# Patient Record
Sex: Male | Born: 1945 | Race: White | Hispanic: No | Marital: Single
Health system: Southern US, Community
[De-identification: ages and names within clinical notes are randomized; demographics above are authoritative.]

## PROBLEM LIST (undated history)

## (undated) DIAGNOSIS — I2119 ST elevation (STEMI) myocardial infarction involving other coronary artery of inferior wall: Secondary | ICD-10-CM

## (undated) DIAGNOSIS — I10 Essential (primary) hypertension: Secondary | ICD-10-CM

## (undated) DIAGNOSIS — I255 Ischemic cardiomyopathy: Secondary | ICD-10-CM

## (undated) DIAGNOSIS — N183 Chronic kidney disease, stage 3 unspecified: Secondary | ICD-10-CM

## (undated) DIAGNOSIS — I472 Ventricular tachycardia, unspecified: Secondary | ICD-10-CM

## (undated) DIAGNOSIS — I5022 Chronic systolic (congestive) heart failure: Secondary | ICD-10-CM

## (undated) DIAGNOSIS — E785 Hyperlipidemia, unspecified: Secondary | ICD-10-CM

## (undated) DIAGNOSIS — I4901 Ventricular fibrillation: Secondary | ICD-10-CM

## (undated) DIAGNOSIS — I25119 Atherosclerotic heart disease of native coronary artery with unspecified angina pectoris: Secondary | ICD-10-CM

## (undated) DIAGNOSIS — Z95810 Presence of automatic (implantable) cardiac defibrillator: Secondary | ICD-10-CM

## (undated) DIAGNOSIS — F419 Anxiety disorder, unspecified: Secondary | ICD-10-CM

## (undated) DIAGNOSIS — I5042 Chronic combined systolic (congestive) and diastolic (congestive) heart failure: Secondary | ICD-10-CM

## (undated) DIAGNOSIS — I2510 Atherosclerotic heart disease of native coronary artery without angina pectoris: Secondary | ICD-10-CM

## (undated) DIAGNOSIS — I119 Hypertensive heart disease without heart failure: Secondary | ICD-10-CM

## (undated) HISTORY — DX: Morbid (severe) obesity due to excess calories: E66.01

## (undated) HISTORY — DX: Hyperlipidemia, unspecified: E78.5

## (undated) HISTORY — DX: Chronic combined systolic (congestive) and diastolic (congestive) heart failure: I50.42

## (undated) HISTORY — DX: Ventricular fibrillation: I49.01

## (undated) HISTORY — DX: Essential (primary) hypertension: I10

## (undated) HISTORY — PX: SKIN SURGERY: SHX2413

## (undated) HISTORY — DX: Anxiety disorder, unspecified: F41.9

## (undated) HISTORY — DX: Ischemic cardiomyopathy: I25.5

## (undated) HISTORY — DX: Atherosclerotic heart disease of native coronary artery with unspecified angina pectoris: I25.119

## (undated) HISTORY — PX: HEMORRHOID SURGERY: SHX153

## (undated) HISTORY — DX: Chronic kidney disease, stage 3 (moderate): N18.3

## (undated) HISTORY — DX: Hypertensive heart disease without heart failure: I11.9

## (undated) HISTORY — PX: TRANSTHORACIC ECHOCARDIOGRAM: SHX275

## (undated) HISTORY — DX: ST elevation (STEMI) myocardial infarction involving other coronary artery of inferior wall: I21.19

## (undated) SURGERY — Surgical Case
Anesthesia: *Unknown

---

## 2011-03-17 DIAGNOSIS — L819 Disorder of pigmentation, unspecified: Secondary | ICD-10-CM | POA: Diagnosis not present

## 2011-03-17 DIAGNOSIS — L821 Other seborrheic keratosis: Secondary | ICD-10-CM | POA: Diagnosis not present

## 2011-03-17 DIAGNOSIS — L578 Other skin changes due to chronic exposure to nonionizing radiation: Secondary | ICD-10-CM | POA: Diagnosis not present

## 2011-05-22 DIAGNOSIS — L980 Pyogenic granuloma: Secondary | ICD-10-CM | POA: Diagnosis not present

## 2011-05-22 DIAGNOSIS — L080 Pyoderma: Secondary | ICD-10-CM | POA: Diagnosis not present

## 2011-09-04 DIAGNOSIS — L270 Generalized skin eruption due to drugs and medicaments taken internally: Secondary | ICD-10-CM | POA: Diagnosis not present

## 2011-09-29 DIAGNOSIS — L819 Disorder of pigmentation, unspecified: Secondary | ICD-10-CM | POA: Diagnosis not present

## 2011-09-29 DIAGNOSIS — L578 Other skin changes due to chronic exposure to nonionizing radiation: Secondary | ICD-10-CM | POA: Diagnosis not present

## 2011-09-29 DIAGNOSIS — L820 Inflamed seborrheic keratosis: Secondary | ICD-10-CM | POA: Diagnosis not present

## 2011-09-29 DIAGNOSIS — L219 Seborrheic dermatitis, unspecified: Secondary | ICD-10-CM | POA: Diagnosis not present

## 2011-09-29 DIAGNOSIS — L570 Actinic keratosis: Secondary | ICD-10-CM | POA: Diagnosis not present

## 2011-09-29 DIAGNOSIS — Z85828 Personal history of other malignant neoplasm of skin: Secondary | ICD-10-CM | POA: Diagnosis not present

## 2011-09-29 DIAGNOSIS — L821 Other seborrheic keratosis: Secondary | ICD-10-CM | POA: Diagnosis not present

## 2011-10-16 DIAGNOSIS — H2510 Age-related nuclear cataract, unspecified eye: Secondary | ICD-10-CM | POA: Diagnosis not present

## 2011-10-16 DIAGNOSIS — H5210 Myopia, unspecified eye: Secondary | ICD-10-CM | POA: Diagnosis not present

## 2011-12-03 DIAGNOSIS — Z23 Encounter for immunization: Secondary | ICD-10-CM | POA: Diagnosis not present

## 2011-12-29 DIAGNOSIS — L578 Other skin changes due to chronic exposure to nonionizing radiation: Secondary | ICD-10-CM | POA: Diagnosis not present

## 2011-12-29 DIAGNOSIS — Z85828 Personal history of other malignant neoplasm of skin: Secondary | ICD-10-CM | POA: Diagnosis not present

## 2011-12-29 DIAGNOSIS — L719 Rosacea, unspecified: Secondary | ICD-10-CM | POA: Diagnosis not present

## 2011-12-29 DIAGNOSIS — L570 Actinic keratosis: Secondary | ICD-10-CM | POA: Diagnosis not present

## 2012-05-17 DIAGNOSIS — L578 Other skin changes due to chronic exposure to nonionizing radiation: Secondary | ICD-10-CM | POA: Diagnosis not present

## 2012-05-17 DIAGNOSIS — Z85828 Personal history of other malignant neoplasm of skin: Secondary | ICD-10-CM | POA: Diagnosis not present

## 2012-05-17 DIAGNOSIS — L819 Disorder of pigmentation, unspecified: Secondary | ICD-10-CM | POA: Diagnosis not present

## 2012-05-17 DIAGNOSIS — L565 Disseminated superficial actinic porokeratosis (DSAP): Secondary | ICD-10-CM | POA: Diagnosis not present

## 2012-05-17 DIAGNOSIS — L821 Other seborrheic keratosis: Secondary | ICD-10-CM | POA: Diagnosis not present

## 2012-05-17 DIAGNOSIS — D485 Neoplasm of uncertain behavior of skin: Secondary | ICD-10-CM | POA: Diagnosis not present

## 2012-06-08 DIAGNOSIS — L565 Disseminated superficial actinic porokeratosis (DSAP): Secondary | ICD-10-CM | POA: Diagnosis not present

## 2012-06-08 DIAGNOSIS — D485 Neoplasm of uncertain behavior of skin: Secondary | ICD-10-CM | POA: Diagnosis not present

## 2012-06-29 DIAGNOSIS — E785 Hyperlipidemia, unspecified: Secondary | ICD-10-CM | POA: Diagnosis not present

## 2012-06-29 DIAGNOSIS — Z125 Encounter for screening for malignant neoplasm of prostate: Secondary | ICD-10-CM | POA: Diagnosis not present

## 2012-06-29 DIAGNOSIS — R7301 Impaired fasting glucose: Secondary | ICD-10-CM | POA: Diagnosis not present

## 2012-06-29 DIAGNOSIS — I10 Essential (primary) hypertension: Secondary | ICD-10-CM | POA: Diagnosis not present

## 2012-07-21 DIAGNOSIS — Z6839 Body mass index (BMI) 39.0-39.9, adult: Secondary | ICD-10-CM | POA: Diagnosis not present

## 2012-07-21 DIAGNOSIS — Z0000 Encounter for general adult medical examination without abnormal findings: Secondary | ICD-10-CM | POA: Diagnosis not present

## 2012-07-21 DIAGNOSIS — R7301 Impaired fasting glucose: Secondary | ICD-10-CM | POA: Diagnosis not present

## 2012-07-21 DIAGNOSIS — R972 Elevated prostate specific antigen [PSA]: Secondary | ICD-10-CM | POA: Diagnosis not present

## 2012-07-21 DIAGNOSIS — E785 Hyperlipidemia, unspecified: Secondary | ICD-10-CM | POA: Diagnosis not present

## 2012-07-21 DIAGNOSIS — I10 Essential (primary) hypertension: Secondary | ICD-10-CM | POA: Diagnosis not present

## 2012-07-21 DIAGNOSIS — Z1331 Encounter for screening for depression: Secondary | ICD-10-CM | POA: Diagnosis not present

## 2012-11-21 DIAGNOSIS — L219 Seborrheic dermatitis, unspecified: Secondary | ICD-10-CM | POA: Diagnosis not present

## 2012-11-21 DIAGNOSIS — L259 Unspecified contact dermatitis, unspecified cause: Secondary | ICD-10-CM | POA: Diagnosis not present

## 2012-11-21 DIAGNOSIS — Z85828 Personal history of other malignant neoplasm of skin: Secondary | ICD-10-CM | POA: Diagnosis not present

## 2012-11-21 DIAGNOSIS — L570 Actinic keratosis: Secondary | ICD-10-CM | POA: Diagnosis not present

## 2012-11-21 DIAGNOSIS — Q828 Other specified congenital malformations of skin: Secondary | ICD-10-CM | POA: Diagnosis not present

## 2012-11-24 DIAGNOSIS — Z23 Encounter for immunization: Secondary | ICD-10-CM | POA: Diagnosis not present

## 2012-12-09 DIAGNOSIS — F4323 Adjustment disorder with mixed anxiety and depressed mood: Secondary | ICD-10-CM | POA: Diagnosis not present

## 2013-01-06 DIAGNOSIS — F4323 Adjustment disorder with mixed anxiety and depressed mood: Secondary | ICD-10-CM | POA: Diagnosis not present

## 2013-03-24 DIAGNOSIS — H2510 Age-related nuclear cataract, unspecified eye: Secondary | ICD-10-CM | POA: Diagnosis not present

## 2013-03-24 DIAGNOSIS — H52209 Unspecified astigmatism, unspecified eye: Secondary | ICD-10-CM | POA: Diagnosis not present

## 2013-03-24 DIAGNOSIS — H5210 Myopia, unspecified eye: Secondary | ICD-10-CM | POA: Diagnosis not present

## 2013-04-28 DIAGNOSIS — F4323 Adjustment disorder with mixed anxiety and depressed mood: Secondary | ICD-10-CM | POA: Diagnosis not present

## 2013-08-02 DIAGNOSIS — IMO0002 Reserved for concepts with insufficient information to code with codable children: Secondary | ICD-10-CM | POA: Diagnosis not present

## 2013-10-06 DIAGNOSIS — L819 Disorder of pigmentation, unspecified: Secondary | ICD-10-CM | POA: Diagnosis not present

## 2013-10-06 DIAGNOSIS — D2320 Other benign neoplasm of skin of unspecified ear and external auricular canal: Secondary | ICD-10-CM | POA: Diagnosis not present

## 2013-10-06 DIAGNOSIS — L570 Actinic keratosis: Secondary | ICD-10-CM | POA: Diagnosis not present

## 2013-10-06 DIAGNOSIS — L565 Disseminated superficial actinic porokeratosis (DSAP): Secondary | ICD-10-CM | POA: Diagnosis not present

## 2013-10-06 DIAGNOSIS — Z85828 Personal history of other malignant neoplasm of skin: Secondary | ICD-10-CM | POA: Diagnosis not present

## 2013-10-06 DIAGNOSIS — D2330 Other benign neoplasm of skin of unspecified part of face: Secondary | ICD-10-CM | POA: Diagnosis not present

## 2013-11-10 DIAGNOSIS — Z23 Encounter for immunization: Secondary | ICD-10-CM | POA: Diagnosis not present

## 2014-01-22 DIAGNOSIS — M79642 Pain in left hand: Secondary | ICD-10-CM | POA: Diagnosis not present

## 2014-01-22 DIAGNOSIS — W19XXXA Unspecified fall, initial encounter: Secondary | ICD-10-CM | POA: Diagnosis not present

## 2014-01-22 DIAGNOSIS — R030 Elevated blood-pressure reading, without diagnosis of hypertension: Secondary | ICD-10-CM | POA: Diagnosis not present

## 2014-10-04 DIAGNOSIS — Z85828 Personal history of other malignant neoplasm of skin: Secondary | ICD-10-CM | POA: Diagnosis not present

## 2014-10-04 DIAGNOSIS — D225 Melanocytic nevi of trunk: Secondary | ICD-10-CM | POA: Diagnosis not present

## 2014-10-04 DIAGNOSIS — L821 Other seborrheic keratosis: Secondary | ICD-10-CM | POA: Diagnosis not present

## 2014-10-04 DIAGNOSIS — L565 Disseminated superficial actinic porokeratosis (DSAP): Secondary | ICD-10-CM | POA: Diagnosis not present

## 2014-10-04 DIAGNOSIS — L570 Actinic keratosis: Secondary | ICD-10-CM | POA: Diagnosis not present

## 2014-11-28 DIAGNOSIS — Z23 Encounter for immunization: Secondary | ICD-10-CM | POA: Diagnosis not present

## 2015-01-20 HISTORY — PX: CORONARY ANGIOPLASTY WITH STENT PLACEMENT: SHX49

## 2015-01-20 HISTORY — PX: ICD IMPLANT: EP1208

## 2015-02-12 ENCOUNTER — Encounter: Payer: Self-pay | Admitting: Family Medicine

## 2015-02-12 ENCOUNTER — Ambulatory Visit (INDEPENDENT_AMBULATORY_CARE_PROVIDER_SITE_OTHER): Payer: Medicare Other | Admitting: Family Medicine

## 2015-02-12 VITALS — BP 150/80 | HR 68 | Ht 73.0 in | Wt 313.4 lb

## 2015-02-12 DIAGNOSIS — R778 Other specified abnormalities of plasma proteins: Secondary | ICD-10-CM | POA: Insufficient documentation

## 2015-02-12 DIAGNOSIS — Z7189 Other specified counseling: Secondary | ICD-10-CM

## 2015-02-12 DIAGNOSIS — F32A Depression, unspecified: Secondary | ICD-10-CM

## 2015-02-12 DIAGNOSIS — F418 Other specified anxiety disorders: Secondary | ICD-10-CM | POA: Diagnosis not present

## 2015-02-12 DIAGNOSIS — Z23 Encounter for immunization: Secondary | ICD-10-CM | POA: Diagnosis not present

## 2015-02-12 DIAGNOSIS — R7989 Other specified abnormal findings of blood chemistry: Secondary | ICD-10-CM

## 2015-02-12 DIAGNOSIS — F419 Anxiety disorder, unspecified: Secondary | ICD-10-CM

## 2015-02-12 DIAGNOSIS — I10 Essential (primary) hypertension: Secondary | ICD-10-CM | POA: Diagnosis not present

## 2015-02-12 DIAGNOSIS — N400 Enlarged prostate without lower urinary tract symptoms: Secondary | ICD-10-CM | POA: Insufficient documentation

## 2015-02-12 DIAGNOSIS — N529 Male erectile dysfunction, unspecified: Secondary | ICD-10-CM | POA: Insufficient documentation

## 2015-02-12 DIAGNOSIS — F329 Major depressive disorder, single episode, unspecified: Secondary | ICD-10-CM

## 2015-02-12 DIAGNOSIS — E785 Hyperlipidemia, unspecified: Secondary | ICD-10-CM | POA: Insufficient documentation

## 2015-02-12 DIAGNOSIS — Z7689 Persons encountering health services in other specified circumstances: Secondary | ICD-10-CM

## 2015-02-12 MED ORDER — VENLAFAXINE HCL 75 MG PO TABS: 75.0000 mg | ORAL_TABLET | Freq: Every day | ORAL | Status: DC

## 2015-02-12 NOTE — Patient Instructions (Addendum)
Return for blood pressure check in 10-14 days. Also, schedule a complete physical exam and fasting blood work at your convenience in the next few weeks. You received your Tdap today. This shot is tetanus, diphtheria as well as whooping cough protection.  DASH Eating Plan DASH stands for "Dietary Approaches to Stop Hypertension." The DASH eating plan is a healthy eating plan that has been shown to reduce high blood pressure (hypertension). Additional health benefits may include reducing the risk of type 2 diabetes mellitus, heart disease, and stroke. The DASH eating plan may also help with weight loss. WHAT DO I NEED TO KNOW ABOUT THE DASH EATING PLAN? For the DASH eating plan, you will follow these general guidelines:  Choose foods with a percent daily value for sodium of less than 5% (as listed on the food label).  Use salt-free seasonings or herbs instead of table salt or sea salt.  Check with your health care provider or pharmacist before using salt substitutes.  Eat lower-sodium products, often labeled as "lower sodium" or "no salt added."  Eat fresh foods.  Eat more vegetables, fruits, and low-fat dairy products.  Choose whole grains. Look for the word "whole" as the first word in the ingredient list.  Choose fish and skinless chicken or [place] more often than red meat. Limit fish, poultry, and meat to 6 oz (170 g) each day.  Limit sweets, desserts, sugars, and sugary drinks.  Choose heart-healthy fats.  Limit cheese to 1 oz (28 g) per day.  Eat more home-cooked food and less restaurant, buffet, and fast food.  Limit fried foods.  Cook foods using methods other than frying.  Limit canned vegetables. If you do use them, rinse them well to decrease the sodium.  When eating at a restaurant, ask that your food be prepared with less salt, or no salt if possible. WHAT FOODS CAN I EAT? Seek help from a dietitian for individual calorie needs. Grains Whole grain or whole wheat  bread. Brown rice. Whole grain or whole wheat pasta. Quinoa, bulgur, and whole grain cereals. Low-sodium cereals. Corn or whole wheat flour tortillas. Whole grain cornbread. Whole grain crackers. Low-sodium crackers. Vegetables Fresh or frozen vegetables (raw, steamed, roasted, or grilled). Low-sodium or reduced-sodium tomato and vegetable juices. Low-sodium or reduced-sodium tomato sauce and paste. Low-sodium or reduced-sodium canned vegetables.  Fruits All fresh, canned (in natural juice), or frozen fruits. Meat and Other Protein Products Ground beef (85% or leaner), grass-fed beef, or beef trimmed of fat. Skinless chicken or [place]. Ground chicken or [place]. Pork trimmed of fat. All fish and seafood. Eggs. Dried beans, peas, or lentils. Unsalted nuts and seeds. Unsalted canned beans. Dairy Low-fat dairy products, such as skim or 1% milk, 2% or reduced-fat cheeses, low-fat ricotta or cottage cheese, or plain low-fat yogurt. Low-sodium or reduced-sodium cheeses. Fats and Oils Tub margarines without trans fats. Light or reduced-fat mayonnaise and salad dressings (reduced sodium). Avocado. Safflower, olive, or canola oils. Natural peanut or almond butter. Other Unsalted popcorn and pretzels. The items listed above may not be a complete list of recommended foods or beverages. Contact your dietitian for more options. WHAT FOODS ARE NOT RECOMMENDED? Grains White bread. White pasta. White rice. Refined cornbread. Bagels and croissants. Crackers that contain trans fat. Vegetables Creamed or fried vegetables. Vegetables in a cheese sauce. Regular canned vegetables. Regular canned tomato sauce and paste. Regular tomato and vegetable juices. Fruits Dried fruits. Canned fruit in light or heavy syrup. Fruit juice. Meat and Other Protein Products  Fatty cuts of meat. Ribs, chicken wings, bacon, sausage, bologna, salami, chitterlings, fatback, hot dogs, bratwurst, and packaged luncheon meats. Salted nuts and  seeds. Canned beans with salt. Dairy Whole or 2% milk, cream, half-and-half, and cream cheese. Whole-fat or sweetened yogurt. Full-fat cheeses or blue cheese. Nondairy creamers and whipped toppings. Processed cheese, cheese spreads, or cheese curds. Condiments Onion and garlic salt, seasoned salt, table salt, and sea salt. Canned and packaged gravies. Worcestershire sauce. Tartar sauce. Barbecue sauce. Teriyaki sauce. Soy sauce, including reduced sodium. Steak sauce. Fish sauce. Oyster sauce. Cocktail sauce. Horseradish. Ketchup and mustard. Meat flavorings and tenderizers. Bouillon cubes. Hot sauce. Tabasco sauce. Marinades. Taco seasonings. Relishes. Fats and Oils Butter, stick margarine, lard, shortening, ghee, and bacon fat. Coconut, palm kernel, or palm oils. Regular salad dressings. Other Pickles and olives. Salted popcorn and pretzels. The items listed above may not be a complete list of foods and beverages to avoid. Contact your dietitian for more information. WHERE CAN I FIND MORE INFORMATION? National Heart, Lung, and Blood Institute: travelstabloid.com   This information is not intended to replace advice given to you by your health care provider. Make sure you discuss any questions you have with your health care provider.   Document Released: 12/25/2010 Document Revised: 01/26/2014 Document Reviewed: 11/09/2012 Elsevier Interactive Patient Education [employer].

## 2015-02-12 NOTE — Progress Notes (Signed)
   Subjective:    Patient ID: Leonard [name], male    DOB: 1945-05-11, 70 y.o.   MRN: [id number]  HPI Chief Complaint  Patient presents with  . new pt    get established. needs refill on a few meds   He is new to the practice and here to establish primary care. Last physical exam and blood work in 27-Apr-2012.  He states he was dismissed by Dr Forde Dandy at [facility] due to missing an appointment.  States he just got married in 11/27/2013. His first wife passed away in 04-27-2012 and he cares for his daughter at home who is a quadriplegic. He states he has a great deal of stress at home and that he has a son who is in a court and has an addiction problem. He is currently still working as a Investment banker, operational a baseball team.  He states he needs refill on Effexor for anxiety. Requesting an increase in dose. States he does not think he is depressed but is anxious due to all the stress he is dealing with. He states he has seen a counselor in the past but not recently. He reports "self-medicating" and drinks 3-4 glasses of scotch approximate 4 nights per week. Does not smoke and denies drug use.  Denies fever, chills, unexpected weight change, fatigue, chest pain, DOE.  PMH: Hypertension, reports good medication compliance. He does not check his blood pressure at home. history of hyperlipidemia.   BPH with elevated PSA, sees [facility]. States he had a procedure due to elevated PSA and everything checked out okay. Reports childhood asthma but no issues since.    States he needs tetanus updated and would like to get this today. He cannot recall ever having Tdap.  States he had the flu shot this season.     Review of Systems Pertinent positives and negatives in the history of present illness.    Objective:   Physical Exam  Constitutional: He is oriented to person, place, and time. He appears well-developed and well-nourished.  Cardiovascular: Normal rate, regular rhythm and normal heart  sounds.   Pulmonary/Chest: Effort normal and breath sounds normal.  Neurological: He is alert and oriented to person, place, and time.  Skin: Skin is warm and dry. No pallor.  Psychiatric: He has a normal mood and affect. His behavior is normal. Judgment and thought content normal.   BP 150/80 mmHg  Pulse 68  Ht 6\' 1"  (1.854 m)  Wt 313 lb 6.4 oz (142.157 kg)  BMI 41.36 kg/m2  Repeat blood pressure 150/80.      Assessment & Plan:  Essential hypertension  Anxiety and depression - Plan: venlafaxine (EFFEXOR) 75 MG tablet  Need for Tdap vaccination - Plan: Tdap vaccine greater than or equal to 7yo IM  Encounter to establish care  Discussed that I will refill Effexor for his anxiety but that I am not comfortable increasing the dosage today. Discussed the possibility of referring him to counseling. He does not want to do this at this time. Will consider increasing dose of Effexor if no improvement.  Recommend drinking no more than 2 glasses of scotch per night for overall health reasons. Tdap given.  Recommend that he return in 10-14 days for a blood pressure check. Also, recommend that he schedule an appointment for fasting blood work and a complete physical exam at his convenience in the next few weeks. Will order PSA unless Urologist is watching this.

## 2015-02-25 ENCOUNTER — Other Ambulatory Visit: Payer: Medicare Other

## 2015-02-26 DIAGNOSIS — H5213 Myopia, bilateral: Secondary | ICD-10-CM | POA: Diagnosis not present

## 2015-02-26 DIAGNOSIS — H2513 Age-related nuclear cataract, bilateral: Secondary | ICD-10-CM | POA: Diagnosis not present

## 2015-03-01 ENCOUNTER — Other Ambulatory Visit: Payer: Medicare Other

## 2015-03-05 ENCOUNTER — Telehealth: Payer: Self-pay | Admitting: Family Medicine

## 2015-03-05 ENCOUNTER — Other Ambulatory Visit: Payer: Self-pay | Admitting: Family Medicine

## 2015-03-05 ENCOUNTER — Encounter: Payer: Self-pay | Admitting: Family Medicine

## 2015-03-05 MED ORDER — METOPROLOL TARTRATE 25 MG PO TABS: 25.0000 mg | ORAL_TABLET | Freq: Every day | ORAL | Status: DC

## 2015-03-05 NOTE — Telephone Encounter (Signed)
Rcvd refill request for Metoprolol ER Succinate 25 mg

## 2015-03-05 NOTE — Telephone Encounter (Signed)
done

## 2015-03-06 ENCOUNTER — Telehealth: Payer: Self-pay | Admitting: Internal Medicine

## 2015-03-06 MED ORDER — METOPROLOL SUCCINATE ER 25 MG PO TB24: 25.0000 mg | ORAL_TABLET | Freq: Every day | ORAL | Status: DC

## 2015-03-06 NOTE — Telephone Encounter (Signed)
Pharmacy called to let us know that pt has been on metoprolol 25mg  ER and not the regular 25mg . Pharmacy gave pt the regular 25mg  as that is what we sent to the pharmacy. Pt has only been seen here once, and so it could of been that the patient was telling us his meds off the top of his head and just missed the ER part of med. i have sent in new rx for metprolol 25mg  ER and called and told pt that he doesn't need to take the regular 25mg  but needed to go pick up new rx of ER to the pharmacy.

## 2015-03-22 ENCOUNTER — Encounter: Payer: Self-pay | Admitting: Medical

## 2015-03-22 ENCOUNTER — Ambulatory Visit (INDEPENDENT_AMBULATORY_CARE_PROVIDER_SITE_OTHER): Payer: Medicare Other | Admitting: Medical

## 2015-03-22 VITALS — BP 124/70 | HR 114 | Temp 100.6°F | Resp 18 | Wt 310.0 lb

## 2015-03-22 DIAGNOSIS — Z789 Other specified health status: Secondary | ICD-10-CM

## 2015-03-22 DIAGNOSIS — R509 Fever, unspecified: Secondary | ICD-10-CM | POA: Diagnosis not present

## 2015-03-22 DIAGNOSIS — E781 Pure hyperglyceridemia: Secondary | ICD-10-CM

## 2015-03-22 DIAGNOSIS — Z7289 Other problems related to lifestyle: Secondary | ICD-10-CM

## 2015-03-22 DIAGNOSIS — R112 Nausea with vomiting, unspecified: Secondary | ICD-10-CM | POA: Diagnosis not present

## 2015-03-22 DIAGNOSIS — R1084 Generalized abdominal pain: Secondary | ICD-10-CM | POA: Diagnosis not present

## 2015-03-22 DIAGNOSIS — R197 Diarrhea, unspecified: Secondary | ICD-10-CM

## 2015-03-22 LAB — BASIC METABOLIC PANEL
BUN: 26 mg/dL — ABNORMAL HIGH (ref 7–25)
CO2: 25 mmol/L (ref 20–31)
CREATININE: 1.33 mg/dL — AB (ref 0.70–1.18)
Calcium: 8.7 mg/dL (ref 8.6–10.3)
Chloride: 97 mmol/L — ABNORMAL LOW (ref 98–110)
Glucose, Bld: 138 mg/dL — ABNORMAL HIGH (ref 65–99)
Potassium: 4.7 mmol/L (ref 3.5–5.3)
Sodium: 134 mmol/L — ABNORMAL LOW (ref 135–146)

## 2015-03-22 LAB — CBC WITH DIFFERENTIAL/PLATELET
BASOS PCT: 0 % (ref 0–1)
Basophils Absolute: 0 10*3/uL (ref 0.0–0.1)
EOS ABS: 0 10*3/uL (ref 0.0–0.7)
Eosinophils Relative: 0 % (ref 0–5)
HCT: 48.4 % (ref 39.0–52.0)
Hemoglobin: 16.3 g/dL (ref 13.0–17.0)
LYMPHS PCT: 5 % — AB (ref 12–46)
Lymphs Abs: 0.9 10*3/uL (ref 0.7–4.0)
MCH: 32.7 pg (ref 26.0–34.0)
MCHC: 33.7 g/dL (ref 30.0–36.0)
MCV: 97.2 fL (ref 78.0–100.0)
MONO ABS: 0.7 10*3/uL (ref 0.1–1.0)
MONOS PCT: 4 % (ref 3–12)
MPV: 9.8 fL (ref 8.6–12.4)
NEUTROS ABS: 16.4 10*3/uL — AB (ref 1.7–7.7)
Neutrophils Relative %: 91 % — ABNORMAL HIGH (ref 43–77)
PLATELETS: 259 10*3/uL (ref 150–400)
RBC: 4.98 MIL/uL (ref 4.22–5.81)
RDW: 13.5 % (ref 11.5–15.5)
WBC: 18 10*3/uL — ABNORMAL HIGH (ref 4.0–10.5)

## 2015-03-22 LAB — POCT URINALYSIS DIPSTICK
BILIRUBIN UA: NEGATIVE
Blood, UA: NEGATIVE
Glucose, UA: NEGATIVE
KETONES UA: NEGATIVE
Nitrite, UA: NEGATIVE
SPEC GRAV UA: 1.025
Urobilinogen, UA: NEGATIVE
pH, UA: 6

## 2015-03-22 LAB — LIPASE: LIPASE: 8 U/L (ref 7–60)

## 2015-03-22 MED ORDER — TRAMADOL HCL 50 MG PO TABS: 50.0000 mg | ORAL_TABLET | Freq: Three times a day (TID) | ORAL | Status: DC | PRN

## 2015-03-22 NOTE — Addendum Note (Signed)
Addended by: Billie Lade on: 03/22/2015 02:56 PM   Modules accepted: Orders

## 2015-03-22 NOTE — Progress Notes (Signed)
Subjective: Chief Complaint  Patient presents with  . Nausea    and vomitting started last night. says it was projectile vomiting. has not eaten. diarrhea now.    Here with wife.   Awoke 10:30pm last night with vomiting, was feeling bad last evening.   Had projectile vomiting last night.  Now has diarrhea this morning but it calmed down some.    Was worried since this is Friday afternoon.   Prior to yesterday felt fine.  Wife had stomach bug this past week.   She had similar symptoms but milder.  Vomited once this morning.  Has had several loose stools this morning, but this afternoon just a few low volume stools.   Has felt feverish.  No reparatory symptoms, no chest pain, no dyspnea.  No back pain.   No urinary issues, no burning, no blood in urine or stool.   Has been hydrating some.   Has had lots of sick contacts at work too.  He did eat shrimp fried last night that didn't taste good.  No hx/o pancreatitis or diverticulitis.   Drinks scotch daily . No other aggravating or relieving factors. No other complaint.  Of note 01/2015 PSA was over 6, but he has had biopsy and says its stable, been high.   Past Medical History  Diagnosis Date  . Anxiety   . Hypertension   . Hyperlipidemia     History reviewed. No pertinent past surgical history.  No abdominal surgery  ROS as in subjective  Objective: BP 124/70 mmHg  Pulse 114  Temp(Src) 100.6 F (38.1 C) (Tympanic)  Resp 18  Wt 310 lb (140.615 kg)  General appearance: alert, no distress, WD/WN, obese white male Oral cavity: MMM, no lesions Neck: supple, no lymphadenopathy, no thyromegaly, no masses Heart: RRR, normal S1, S2, no murmurs Lungs: CTA bilaterally, no wheezes, rhonchi, or rales Abdomen: +increased bs, soft, non tender, mildly distended, no masses, no hepatomegaly, no splenomegaly Back:non tender Pulses:1+ symmetric, upper and lower extremities, normal cap refill Ext: no edema  Assessment: Encounter Diagnoses  Name  Primary?  . Generalized abdominal pain Yes  . Fever, unspecified   . Non-intractable vomiting with nausea, unspecified vomiting type   . Diarrhea, unspecified type   . High triglycerides   . Alcohol use ([place])     Plan: Likely viral gastroenterics, but labs today to help rule out other.   Advised clear fluids only for the next 24 hours, then if symptoms much improved gradual return to [employer], can use Tylenol for pain, can c/t Pepto Bismol, but hold off on imodium for now.  D/c Valsartan for 24 hours since he is somewhat dehydrated.   F/u pending labs. Discussed symptoms /signs that would prompt ED visit over the weekend. We will cal with lab results and plan.  Mariel was seen today for nausea.  Diagnoses and all orders for this visit:  Generalized abdominal pain -     Basic metabolic panel -     CBC with Differential/Platelet -     Lipase  Fever, unspecified -     Basic metabolic panel -     CBC with Differential/Platelet -     Lipase  Non-intractable vomiting with nausea, unspecified vomiting type -     Basic metabolic panel -     CBC with Differential/Platelet -     Lipase  Diarrhea, unspecified type -     Basic metabolic panel -     CBC with Differential/Platelet -  Lipase  High triglycerides -     Basic metabolic panel -     CBC with Differential/Platelet -     Lipase  Alcohol use ([facility]) -     Basic metabolic panel -     CBC with Differential/Platelet -     Lipase  Other orders -     Discontinue: traMADol (ULTRAM) 50 MG tablet; Take 1 tablet (50 mg total) by mouth every 8 (eight) hours as needed.

## 2015-04-08 ENCOUNTER — Other Ambulatory Visit: Payer: Self-pay | Admitting: Family Medicine

## 2015-04-11 ENCOUNTER — Other Ambulatory Visit: Payer: Self-pay | Admitting: Family Medicine

## 2015-04-16 ENCOUNTER — Other Ambulatory Visit: Payer: Self-pay | Admitting: Family Medicine

## 2015-04-16 ENCOUNTER — Ambulatory Visit (INDEPENDENT_AMBULATORY_CARE_PROVIDER_SITE_OTHER): Payer: Medicare Other | Admitting: Family Medicine

## 2015-04-16 ENCOUNTER — Encounter: Payer: Self-pay | Admitting: Family Medicine

## 2015-04-16 VITALS — BP 126/64 | HR 64 | Wt 312.6 lb

## 2015-04-16 DIAGNOSIS — R748 Abnormal levels of other serum enzymes: Secondary | ICD-10-CM | POA: Diagnosis not present

## 2015-04-16 DIAGNOSIS — D72829 Elevated white blood cell count, unspecified: Secondary | ICD-10-CM | POA: Diagnosis not present

## 2015-04-16 DIAGNOSIS — R7989 Other specified abnormal findings of blood chemistry: Secondary | ICD-10-CM

## 2015-04-16 DIAGNOSIS — R945 Abnormal results of liver function studies: Secondary | ICD-10-CM | POA: Diagnosis not present

## 2015-04-16 DIAGNOSIS — I10 Essential (primary) hypertension: Secondary | ICD-10-CM | POA: Diagnosis not present

## 2015-04-16 NOTE — Progress Notes (Signed)
   Subjective:    Patient ID: Leonard [name], male    DOB: 02/16/1945, 70 y.o.   MRN: [id number]  HPI Chief Complaint  Patient presents with  . follow-up    follow-up on bp and labs.    He is here for follow up on blood pressure and abnormal labs. He had a viral gastroenteritis a few weeks ago and states he was very ill for several days. He came in and saw [place], [place] at that time.  States he got better is back to normal. Denies any concerns or complaints today.  Denies fever, chills, cough, DOE, chest pain, abdominal pain, back pain, GI or GU symptoms.  He has been cutting back on alcohol, states he drinks 2-3 shots of scotch 3-4 times per week now which is less than he was drinking.  He reports good medication compliance.   His serum creatinine was elevated and he also had an elevated white count at his last visit. He is here today to have those repeated.  Review of Systems Pertinent positives and negatives in the history of present illness.     Objective:   Physical Exam BP 126/64 mmHg  Pulse 64  Wt 312 lb 9.6 oz (141.794 kg)  Alert and in no distress. Cardiac exam shows a regular sinus rhythm without murmurs or gallops. Lungs are clear to auscultation.      Assessment & Plan:  Elevated white blood cell count - Plan: CBC with Differential/Platelet  Elevated serum creatinine - Plan: Comprehensive metabolic panel  Essential hypertension  Discussed that his blood pressure is within goal range today. No changes to his medications needed. We'll repeat labs today and follow up as needed. Discussed that he has not had a complete physical or fasting labs since 2014 per medical records and I recommend that he return for complete physical exam and fasting labs. I congratulated him on reducing his alcohol intake but would like to see him reduce this even more.

## 2015-04-17 ENCOUNTER — Telehealth: Payer: Self-pay | Admitting: Internal Medicine

## 2015-04-17 DIAGNOSIS — E875 Hyperkalemia: Secondary | ICD-10-CM

## 2015-04-17 DIAGNOSIS — R748 Abnormal levels of other serum enzymes: Secondary | ICD-10-CM

## 2015-04-17 LAB — CBC WITH DIFFERENTIAL/PLATELET
BASOS PCT: 0 % (ref 0–1)
Basophils Absolute: 0 10*3/uL (ref 0.0–0.1)
Eosinophils Absolute: 0.2 10*3/uL (ref 0.0–0.7)
Eosinophils Relative: 2 % (ref 0–5)
HEMATOCRIT: 47.7 % (ref 39.0–52.0)
HEMOGLOBIN: 15.8 g/dL (ref 13.0–17.0)
LYMPHS PCT: 19 % (ref 12–46)
Lymphs Abs: 2.2 10*3/uL (ref 0.7–4.0)
MCH: 32.9 pg (ref 26.0–34.0)
MCHC: 33.1 g/dL (ref 30.0–36.0)
MCV: 99.4 fL (ref 78.0–100.0)
MPV: 9.8 fL (ref 8.6–12.4)
Monocytes Absolute: 0.7 10*3/uL (ref 0.1–1.0)
Monocytes Relative: 6 % (ref 3–12)
NEUTROS ABS: 8.4 10*3/uL — AB (ref 1.7–7.7)
NEUTROS PCT: 73 % (ref 43–77)
Platelets: 251 10*3/uL (ref 150–400)
RBC: 4.8 MIL/uL (ref 4.22–5.81)
RDW: 13.4 % (ref 11.5–15.5)
WBC: 11.5 10*3/uL — AB (ref 4.0–10.5)

## 2015-04-17 LAB — COMPREHENSIVE METABOLIC PANEL
ALBUMIN: 4.3 g/dL (ref 3.6–5.1)
ALT: 60 U/L — ABNORMAL HIGH (ref 9–46)
AST: 46 U/L — ABNORMAL HIGH (ref 10–35)
Alkaline Phosphatase: 48 U/L (ref 40–115)
BUN: 24 mg/dL (ref 7–25)
CALCIUM: 9.4 mg/dL (ref 8.6–10.3)
CHLORIDE: 99 mmol/L (ref 98–110)
CO2: 27 mmol/L (ref 20–31)
Creat: 1.4 mg/dL — ABNORMAL HIGH (ref 0.70–1.18)
Glucose, Bld: 105 mg/dL — ABNORMAL HIGH (ref 65–99)
Potassium: 5.6 mmol/L — ABNORMAL HIGH (ref 3.5–5.3)
Sodium: 136 mmol/L (ref 135–146)
Total Bilirubin: 0.5 mg/dL (ref 0.2–1.2)
Total Protein: 6.8 g/dL (ref 6.1–8.1)

## 2015-04-17 LAB — HEPATITIS PANEL, ACUTE
HCV AB: NEGATIVE
HEP B C IGM: NONREACTIVE
Hep A IgM: NONREACTIVE
Hepatitis B Surface Ag: NEGATIVE

## 2015-04-17 NOTE — Telephone Encounter (Signed)
Put in orders and pt is coming in for lab work tomorrow and [place] next tuesday

## 2015-04-17 NOTE — Telephone Encounter (Signed)
-----   Message from Girtha Rm, NP sent at 04/17/2015  8:35 AM EDT ----- I called and spoke with patient regarding his elevated potassium. He reports he is eating a lot of bananas and melons and drinking a lot of Gatorade. He is going to cut back on these high potassium foods. He is not using salt substitutes or taking NSAIDs. He is going to hold off on Valsartan for now. He denies having any urinary frequency or other symptoms.  Plan to have him return tomorrow for a repeat CMP. He is going to hydrate well today.   Please order CMP for tomorrow due to hyperkalemia and elevated liver enzymes.  Have Jovanka add an acute hepatitis panel.  Schedule him for an abdominal ultrasound due to elevated liver enzymes. Just let him know when and where he needs to go for this. Thanks.

## 2015-04-18 ENCOUNTER — Telehealth: Payer: Self-pay | Admitting: Family Medicine

## 2015-04-18 ENCOUNTER — Other Ambulatory Visit (INDEPENDENT_AMBULATORY_CARE_PROVIDER_SITE_OTHER): Payer: Medicare Other

## 2015-04-18 ENCOUNTER — Other Ambulatory Visit: Payer: Self-pay | Admitting: Family Medicine

## 2015-04-18 DIAGNOSIS — R7309 Other abnormal glucose: Secondary | ICD-10-CM | POA: Diagnosis not present

## 2015-04-18 DIAGNOSIS — E875 Hyperkalemia: Secondary | ICD-10-CM

## 2015-04-18 DIAGNOSIS — R748 Abnormal levels of other serum enzymes: Secondary | ICD-10-CM

## 2015-04-18 LAB — COMPREHENSIVE METABOLIC PANEL
ALK PHOS: 51 U/L (ref 40–115)
ALT: 60 U/L — ABNORMAL HIGH (ref 9–46)
AST: 44 U/L — ABNORMAL HIGH (ref 10–35)
Albumin: 4.4 g/dL (ref 3.6–5.1)
BILIRUBIN TOTAL: 0.6 mg/dL (ref 0.2–1.2)
BUN: 23 mg/dL (ref 7–25)
CALCIUM: 9.7 mg/dL (ref 8.6–10.3)
CO2: 24 mmol/L (ref 20–31)
CREATININE: 1.27 mg/dL — AB (ref 0.70–1.18)
Chloride: 98 mmol/L (ref 98–110)
GLUCOSE: 130 mg/dL — AB (ref 65–99)
Potassium: 4.9 mmol/L (ref 3.5–5.3)
Sodium: 135 mmol/L (ref 135–146)
Total Protein: 6.9 g/dL (ref 6.1–8.1)

## 2015-04-18 LAB — POCT URINALYSIS DIPSTICK
BILIRUBIN UA: NEGATIVE
Glucose, UA: NEGATIVE
Ketones, UA: NEGATIVE
Leukocytes, UA: NEGATIVE
NITRITE UA: NEGATIVE
PH UA: 6
Protein, UA: NEGATIVE
RBC UA: NEGATIVE
UROBILINOGEN UA: NEGATIVE

## 2015-04-18 NOTE — Telephone Encounter (Signed)
Error

## 2015-04-18 NOTE — Progress Notes (Unsigned)
Spoke with patient at his lab visit this morning. Discussed that he will continue taking valsartan and all of his medications as prescribed. Repeating blood work today to check potassium and creatinine level. He is unable to give a urine specimen this morning so he will take a urine specimen cup and returned this promptly after voiding. He states he thinks he is hydrated, urine is light yellow. He denies any difficulty with urination or changes. Discussed that pending labs today we will bring him back in 2 weeks for repeat blood work. He will continue eating  low potassium foods and beverages for now.

## 2015-04-22 LAB — HEMOGLOBIN A1C
Hgb A1c MFr Bld: 6.1 % — ABNORMAL HIGH (ref <5.7)
Mean Plasma Glucose: 128 mg/dL

## 2015-04-23 ENCOUNTER — Other Ambulatory Visit: Payer: Self-pay

## 2015-05-17 ENCOUNTER — Other Ambulatory Visit: Payer: Self-pay | Admitting: Family Medicine

## 2015-05-23 ENCOUNTER — Ambulatory Visit: Payer: Medicare Other | Admitting: Family Medicine

## 2015-05-31 ENCOUNTER — Other Ambulatory Visit: Payer: Self-pay | Admitting: Family Medicine

## 2015-06-23 ENCOUNTER — Other Ambulatory Visit: Payer: Self-pay | Admitting: Family Medicine

## 2015-06-24 ENCOUNTER — Other Ambulatory Visit: Payer: Self-pay | Admitting: Family Medicine

## 2015-07-20 DIAGNOSIS — Z95810 Presence of automatic (implantable) cardiac defibrillator: Secondary | ICD-10-CM

## 2015-07-20 DIAGNOSIS — I25119 Atherosclerotic heart disease of native coronary artery with unspecified angina pectoris: Secondary | ICD-10-CM

## 2015-07-20 DIAGNOSIS — I5042 Chronic combined systolic (congestive) and diastolic (congestive) heart failure: Secondary | ICD-10-CM

## 2015-07-20 HISTORY — DX: Presence of automatic (implantable) cardiac defibrillator: Z95.810

## 2015-07-20 HISTORY — DX: Chronic combined systolic (congestive) and diastolic (congestive) heart failure: I50.42

## 2015-07-20 HISTORY — DX: Atherosclerotic heart disease of native coronary artery with unspecified angina pectoris: I25.119

## 2015-08-07 ENCOUNTER — Telehealth: Payer: Self-pay | Admitting: Family Medicine

## 2015-08-07 ENCOUNTER — Other Ambulatory Visit: Payer: Self-pay | Admitting: Family Medicine

## 2015-08-07 MED ORDER — AMLODIPINE BESYLATE 5 MG PO TABS: 5.0000 mg | ORAL_TABLET | Freq: Every day | ORAL | Status: DC

## 2015-08-07 NOTE — Telephone Encounter (Signed)
done

## 2015-08-07 NOTE — Telephone Encounter (Signed)
Requesting refill on Amlodipine 5 mg

## 2015-08-12 ENCOUNTER — Inpatient Hospital Stay (HOSPITAL_COMMUNITY)
Admission: AD | Admit: 2015-08-12 | Discharge: 2015-08-14 | DRG: 247 | Disposition: A | Discharge status: Home / Self Care | Payer: Medicare Other | Source: Ambulatory Visit | Attending: Cardiology | Admitting: Cardiology

## 2015-08-12 ENCOUNTER — Encounter: Payer: Self-pay | Admitting: Family Medicine

## 2015-08-12 ENCOUNTER — Encounter (HOSPITAL_COMMUNITY)
Admission: AD | Disposition: A | Discharge status: Home / Self Care | Payer: Self-pay | Source: Ambulatory Visit | Attending: Cardiology

## 2015-08-12 ENCOUNTER — Other Ambulatory Visit: Payer: Self-pay

## 2015-08-12 ENCOUNTER — Ambulatory Visit (INDEPENDENT_AMBULATORY_CARE_PROVIDER_SITE_OTHER): Payer: Medicare Other | Admitting: Family Medicine

## 2015-08-12 ENCOUNTER — Encounter (HOSPITAL_COMMUNITY): Payer: Self-pay | Admitting: Physician Assistant

## 2015-08-12 VITALS — BP 112/70 | HR 92 | Ht 73.5 in | Wt 310.0 lb

## 2015-08-12 DIAGNOSIS — D72829 Elevated white blood cell count, unspecified: Secondary | ICD-10-CM

## 2015-08-12 DIAGNOSIS — I2119 ST elevation (STEMI) myocardial infarction involving other coronary artery of inferior wall: Secondary | ICD-10-CM | POA: Diagnosis not present

## 2015-08-12 DIAGNOSIS — R9431 Abnormal electrocardiogram [ECG] [EKG]: Secondary | ICD-10-CM | POA: Diagnosis not present

## 2015-08-12 DIAGNOSIS — Z8249 Family history of ischemic heart disease and other diseases of the circulatory system: Secondary | ICD-10-CM

## 2015-08-12 DIAGNOSIS — N183 Chronic kidney disease, stage 3 unspecified: Secondary | ICD-10-CM

## 2015-08-12 DIAGNOSIS — Z955 Presence of coronary angioplasty implant and graft: Secondary | ICD-10-CM | POA: Insufficient documentation

## 2015-08-12 DIAGNOSIS — E785 Hyperlipidemia, unspecified: Secondary | ICD-10-CM | POA: Diagnosis not present

## 2015-08-12 DIAGNOSIS — I10 Essential (primary) hypertension: Secondary | ICD-10-CM | POA: Diagnosis not present

## 2015-08-12 DIAGNOSIS — I25119 Atherosclerotic heart disease of native coronary artery with unspecified angina pectoris: Secondary | ICD-10-CM | POA: Insufficient documentation

## 2015-08-12 DIAGNOSIS — I2510 Atherosclerotic heart disease of native coronary artery without angina pectoris: Secondary | ICD-10-CM | POA: Insufficient documentation

## 2015-08-12 DIAGNOSIS — I129 Hypertensive chronic kidney disease with stage 1 through stage 4 chronic kidney disease, or unspecified chronic kidney disease: Secondary | ICD-10-CM | POA: Diagnosis present

## 2015-08-12 DIAGNOSIS — I2129 ST elevation (STEMI) myocardial infarction involving other sites: Secondary | ICD-10-CM

## 2015-08-12 DIAGNOSIS — R945 Abnormal results of liver function studies: Secondary | ICD-10-CM

## 2015-08-12 DIAGNOSIS — R079 Chest pain, unspecified: Secondary | ICD-10-CM

## 2015-08-12 DIAGNOSIS — Z6841 Body Mass Index (BMI) 40.0 and over, adult: Secondary | ICD-10-CM

## 2015-08-12 DIAGNOSIS — Z7982 Long term (current) use of aspirin: Secondary | ICD-10-CM

## 2015-08-12 DIAGNOSIS — R7309 Other abnormal glucose: Secondary | ICD-10-CM

## 2015-08-12 DIAGNOSIS — I2111 ST elevation (STEMI) myocardial infarction involving right coronary artery: Secondary | ICD-10-CM

## 2015-08-12 DIAGNOSIS — D729 Disorder of white blood cells, unspecified: Secondary | ICD-10-CM

## 2015-08-12 DIAGNOSIS — J9811 Atelectasis: Secondary | ICD-10-CM | POA: Diagnosis not present

## 2015-08-12 DIAGNOSIS — Z23 Encounter for immunization: Secondary | ICD-10-CM | POA: Diagnosis not present

## 2015-08-12 DIAGNOSIS — I219 Acute myocardial infarction, unspecified: Secondary | ICD-10-CM

## 2015-08-12 DIAGNOSIS — N1832 Chronic kidney disease, stage 3b: Secondary | ICD-10-CM

## 2015-08-12 DIAGNOSIS — R7989 Other specified abnormal findings of blood chemistry: Secondary | ICD-10-CM

## 2015-08-12 HISTORY — PX: CARDIAC CATHETERIZATION: SHX172

## 2015-08-12 HISTORY — PX: CORONARY ANGIOPLASTY WITH STENT PLACEMENT: SHX49

## 2015-08-12 HISTORY — DX: Atherosclerotic heart disease of native coronary artery without angina pectoris: I25.10

## 2015-08-12 HISTORY — DX: Chronic kidney disease, stage 3 unspecified: N18.30

## 2015-08-12 HISTORY — DX: ST elevation (STEMI) myocardial infarction involving other coronary artery of inferior wall: I21.19

## 2015-08-12 LAB — CK TOTAL AND CKMB (NOT AT ARMC)
CK TOTAL: 1135 U/L — AB (ref 49–397)
CK, MB: 139.3 ng/mL — AB (ref 0.5–5.0)
RELATIVE INDEX: 12.3 — AB (ref 0.0–2.5)

## 2015-08-12 LAB — COMPREHENSIVE METABOLIC PANEL
ALBUMIN: 3.6 g/dL (ref 3.5–5.0)
ALK PHOS: 68 U/L (ref 38–126)
ALT: 84 U/L — AB (ref 17–63)
ANION GAP: 8 (ref 5–15)
AST: 177 U/L — ABNORMAL HIGH (ref 15–41)
BILIRUBIN TOTAL: 1.2 mg/dL (ref 0.3–1.2)
BUN: 19 mg/dL (ref 6–20)
CALCIUM: 9 mg/dL (ref 8.9–10.3)
CO2: 22 mmol/L (ref 22–32)
CREATININE: 1.81 mg/dL — AB (ref 0.61–1.24)
Chloride: 98 mmol/L — ABNORMAL LOW (ref 101–111)
GFR calc Af Amer: 41 mL/min — ABNORMAL LOW (ref >60)
GFR calc non Af Amer: 36 mL/min — ABNORMAL LOW (ref >60)
GLUCOSE: 148 mg/dL — AB (ref 65–99)
Potassium: 4.5 mmol/L (ref 3.5–5.1)
Sodium: 128 mmol/L — ABNORMAL LOW (ref 135–145)
TOTAL PROTEIN: 6.9 g/dL (ref 6.5–8.1)

## 2015-08-12 LAB — POCT I-STAT, CHEM 8
BUN: 22 mg/dL — ABNORMAL HIGH (ref 6–20)
CALCIUM ION: 1.18 mmol/L (ref 1.12–1.23)
Chloride: 95 mmol/L — ABNORMAL LOW (ref 101–111)
Creatinine, Ser: 1.8 mg/dL — ABNORMAL HIGH (ref 0.61–1.24)
GLUCOSE: 148 mg/dL — AB (ref 65–99)
HCT: 51 % (ref 39.0–52.0)
Hemoglobin: 17.3 g/dL — ABNORMAL HIGH (ref 13.0–17.0)
Potassium: 4.6 mmol/L (ref 3.5–5.1)
SODIUM: 131 mmol/L — AB (ref 135–145)
TCO2: 24 mmol/L (ref 0–100)

## 2015-08-12 LAB — LIPID PANEL
Cholesterol: 163 mg/dL (ref 0–200)
HDL: 41 mg/dL (ref >40)
LDL CALC: 98 mg/dL (ref 0–99)
Total CHOL/HDL Ratio: 4 RATIO
Triglycerides: 122 mg/dL (ref <150)
VLDL: 24 mg/dL (ref 0–40)

## 2015-08-12 LAB — CBC
HCT: 48 % (ref 39.0–52.0)
Hemoglobin: 16.3 g/dL (ref 13.0–17.0)
MCH: 33.1 pg (ref 26.0–34.0)
MCHC: 34 g/dL (ref 30.0–36.0)
MCV: 97.6 fL (ref 78.0–100.0)
PLATELETS: 249 10*3/uL (ref 150–400)
RBC: 4.92 MIL/uL (ref 4.22–5.81)
RDW: 14 % (ref 11.5–15.5)
WBC: 22.6 10*3/uL — AB (ref 4.0–10.5)

## 2015-08-12 LAB — TROPONIN I: Troponin I: >65 ng/mL (ref <0.03)

## 2015-08-12 LAB — POCT ACTIVATED CLOTTING TIME: Activated Clotting Time: 483 seconds

## 2015-08-12 LAB — PROTIME-INR
INR: 1.17 (ref 0.00–1.49)
PROTHROMBIN TIME: 15 s (ref 11.6–15.2)

## 2015-08-12 LAB — APTT: APTT: 31 s (ref 24–37)

## 2015-08-12 LAB — MRSA PCR SCREENING: MRSA by PCR: POSITIVE — AB

## 2015-08-12 SURGERY — LEFT HEART CATH AND CORONARY ANGIOGRAPHY

## 2015-08-12 MED ORDER — ACETAMINOPHEN 325 MG PO TABS: 650.0000 mg | ORAL_TABLET | ORAL | Status: DC | PRN

## 2015-08-12 MED ORDER — VENLAFAXINE HCL 75 MG PO TABS
75.0000 mg | ORAL_TABLET | Freq: Every day | ORAL | Status: DC
  Administered 2015-08-13 – 2015-08-14 (×2): 75 mg via ORAL
  Filled 2015-08-12 (×3): qty 1

## 2015-08-12 MED ORDER — BIVALIRUDIN 250 MG IV SOLR
INTRAVENOUS | Status: AC
  Filled 2015-08-12: qty 250

## 2015-08-12 MED ORDER — SODIUM CHLORIDE 0.9 % IV SOLN
INTRAVENOUS | Status: DC | PRN
  Administered 2015-08-12: 140 mL/h via INTRAVENOUS
  Administered 2015-08-12: 250 mL

## 2015-08-12 MED ORDER — IOPAMIDOL (ISOVUE-370) INJECTION 76%
INTRAVENOUS | Status: AC
  Filled 2015-08-12: qty 100

## 2015-08-12 MED ORDER — ASPIRIN 81 MG PO TABS: 81.0000 mg | ORAL_TABLET | Freq: Every day | ORAL | Status: DC

## 2015-08-12 MED ORDER — HEPARIN (PORCINE) IN NACL 2-0.9 UNIT/ML-% IJ SOLN
INTRAMUSCULAR | Status: DC | PRN
  Administered 2015-08-12: 1000 mL

## 2015-08-12 MED ORDER — FENOFIBRATE 160 MG PO TABS: 160.0000 mg | ORAL_TABLET | Freq: Every day | ORAL | Status: DC

## 2015-08-12 MED ORDER — TICAGRELOR 90 MG PO TABS
ORAL_TABLET | ORAL | Status: DC | PRN
Start: 2015-08-12 — End: 2015-08-12
  Administered 2015-08-12: 180 mg via ORAL

## 2015-08-12 MED ORDER — HEPARIN (PORCINE) IN NACL 2-0.9 UNIT/ML-% IJ SOLN
INTRAMUSCULAR | Status: AC
  Filled 2015-08-12: qty 500

## 2015-08-12 MED ORDER — BIVALIRUDIN BOLUS VIA INFUSION - CUPID
INTRAVENOUS | Status: DC | PRN
Start: 2015-08-12 — End: 2015-08-12
  Administered 2015-08-12: 105.45 mg via INTRAVENOUS

## 2015-08-12 MED ORDER — MIDAZOLAM HCL 2 MG/2ML IJ SOLN
INTRAMUSCULAR | Status: AC
  Filled 2015-08-12: qty 2

## 2015-08-12 MED ORDER — IRBESARTAN 150 MG PO TABS
150.0000 mg | ORAL_TABLET | Freq: Every day | ORAL | Status: DC
  Administered 2015-08-13 – 2015-08-14 (×2): 150 mg via ORAL
  Filled 2015-08-12 (×3): qty 1

## 2015-08-12 MED ORDER — LIDOCAINE HCL (PF) 1 % IJ SOLN
INTRAMUSCULAR | Status: AC
  Filled 2015-08-12: qty 30

## 2015-08-12 MED ORDER — PNEUMOCOCCAL VAC POLYVALENT 25 MCG/0.5ML IJ INJ
0.5000 mL | INJECTION | INTRAMUSCULAR | Status: AC
  Administered 2015-08-13: 0.5 mL via INTRAMUSCULAR
  Filled 2015-08-12: qty 0.5

## 2015-08-12 MED ORDER — MUPIROCIN 2 % EX OINT
1.0000 "application " | TOPICAL_OINTMENT | Freq: Two times a day (BID) | CUTANEOUS | Status: DC
  Administered 2015-08-12 – 2015-08-14 (×4): 1 via NASAL
  Filled 2015-08-12: qty 22

## 2015-08-12 MED ORDER — HEPARIN (PORCINE) IN NACL 2-0.9 UNIT/ML-% IJ SOLN
INTRAMUSCULAR | Status: DC | PRN
  Administered 2015-08-12: 500 mL

## 2015-08-12 MED ORDER — IOPAMIDOL (ISOVUE-370) INJECTION 76%
INTRAVENOUS | Status: AC
  Filled 2015-08-12: qty 125

## 2015-08-12 MED ORDER — IOPAMIDOL (ISOVUE-370) INJECTION 76%
INTRAVENOUS | Status: DC | PRN
Start: 2015-08-12 — End: 2015-08-12
  Administered 2015-08-12: 355 mL via INTRA_ARTERIAL

## 2015-08-12 MED ORDER — ASPIRIN 81 MG PO CHEW
81.0000 mg | CHEWABLE_TABLET | Freq: Every day | ORAL | Status: DC
  Administered 2015-08-13 – 2015-08-14 (×2): 81 mg via ORAL
  Filled 2015-08-12 (×2): qty 1

## 2015-08-12 MED ORDER — TICAGRELOR 90 MG PO TABS
ORAL_TABLET | ORAL | Status: AC
  Filled 2015-08-12: qty 2

## 2015-08-12 MED ORDER — SODIUM CHLORIDE 0.9% FLUSH
3.0000 mL | Freq: Two times a day (BID) | INTRAVENOUS | Status: DC
  Administered 2015-08-13: 3 mL via INTRAVENOUS

## 2015-08-12 MED ORDER — VERAPAMIL HCL 2.5 MG/ML IV SOLN
INTRAVENOUS | Status: DC | PRN
  Administered 2015-08-12 (×2): 5 mL via INTRA_ARTERIAL

## 2015-08-12 MED ORDER — AMLODIPINE BESYLATE 5 MG PO TABS
5.0000 mg | ORAL_TABLET | Freq: Every day | ORAL | Status: DC
Start: 2015-08-13 — End: 2015-08-14
  Administered 2015-08-13 – 2015-08-14 (×2): 5 mg via ORAL
  Filled 2015-08-12 (×2): qty 1

## 2015-08-12 MED ORDER — SODIUM CHLORIDE 0.9% FLUSH: 3.0000 mL | INTRAVENOUS | Status: DC | PRN

## 2015-08-12 MED ORDER — MIDAZOLAM HCL 2 MG/2ML IJ SOLN
INTRAMUSCULAR | Status: DC | PRN
Start: 2015-08-12 — End: 2015-08-12
  Administered 2015-08-12 (×2): 1 mg via INTRAVENOUS

## 2015-08-12 MED ORDER — BIVALIRUDIN 250 MG IV SOLR
INTRAVENOUS | Status: DC | PRN
  Administered 2015-08-12 (×3): 1.75 mg/kg/h via INTRAVENOUS

## 2015-08-12 MED ORDER — LIDOCAINE HCL (PF) 1 % IJ SOLN
INTRAMUSCULAR | Status: DC | PRN
  Administered 2015-08-12: 5 mL

## 2015-08-12 MED ORDER — SODIUM CHLORIDE 0.9 % WEIGHT BASED INFUSION
3.0000 mL/kg/h | INTRAVENOUS | Status: AC
  Administered 2015-08-12 (×2): 3 mL/kg/h via INTRAVENOUS

## 2015-08-12 MED ORDER — ONDANSETRON HCL 4 MG/2ML IJ SOLN: 4.0000 mg | Freq: Four times a day (QID) | INTRAMUSCULAR | Status: DC | PRN

## 2015-08-12 MED ORDER — FENTANYL CITRATE (PF) 100 MCG/2ML IJ SOLN
INTRAMUSCULAR | Status: AC
Start: 2015-08-12 — End: 2015-08-12
  Filled 2015-08-12: qty 2

## 2015-08-12 MED ORDER — HEPARIN (PORCINE) IN NACL 2-0.9 UNIT/ML-% IJ SOLN
INTRAMUSCULAR | Status: AC
  Filled 2015-08-12: qty 1000

## 2015-08-12 MED ORDER — SODIUM CHLORIDE 0.9 % IV SOLN: 250.0000 mL | INTRAVENOUS | Status: DC | PRN

## 2015-08-12 MED ORDER — FENTANYL CITRATE (PF) 100 MCG/2ML IJ SOLN
INTRAMUSCULAR | Status: DC | PRN
  Administered 2015-08-12 (×3): 25 ug via INTRAVENOUS

## 2015-08-12 MED ORDER — VERAPAMIL HCL 2.5 MG/ML IV SOLN
INTRAVENOUS | Status: AC
  Filled 2015-08-12: qty 2

## 2015-08-12 MED ORDER — MORPHINE SULFATE (PF) 2 MG/ML IV SOLN: 2.0000 mg | INTRAVENOUS | Status: DC | PRN

## 2015-08-12 MED ORDER — METOPROLOL SUCCINATE ER 25 MG PO TB24
25.0000 mg | ORAL_TABLET | Freq: Every day | ORAL | Status: DC
  Administered 2015-08-13 – 2015-08-14 (×2): 25 mg via ORAL
  Filled 2015-08-12 (×3): qty 1

## 2015-08-12 MED ORDER — CHLORHEXIDINE GLUCONATE CLOTH 2 % EX PADS
6.0000 | MEDICATED_PAD | Freq: Every day | CUTANEOUS | Status: DC
  Administered 2015-08-13 – 2015-08-14 (×2): 6 via TOPICAL

## 2015-08-12 MED ORDER — TICAGRELOR 90 MG PO TABS
90.0000 mg | ORAL_TABLET | Freq: Two times a day (BID) | ORAL | Status: DC
  Administered 2015-08-12 – 2015-08-14 (×4): 90 mg via ORAL
  Filled 2015-08-12 (×4): qty 1

## 2015-08-12 SURGICAL SUPPLY — 26 items
BALLN EMERGE MR 2.0X12 (BALLOONS) ×2
BALLN EMERGE MR 2.5X20 (BALLOONS) ×2
BALLOON EMERGE MR 2.0X12 (BALLOONS) IMPLANT
BALLOON EMERGE MR 2.5X20 (BALLOONS) IMPLANT
CATH HEARTRAIL IKARI 6F IR1.0 (CATHETERS) ×1 IMPLANT
CATH INFINITI 5 FR JL3.5 (CATHETERS) ×1 IMPLANT
CATH OPTITORQUE TIG 4.0 5F (CATHETERS) ×1 IMPLANT
CATH VISTA GUIDE 6FR JR4 (CATHETERS) ×1 IMPLANT
COVER PRB 48X5XTLSCP FOLD TPE (BAG) IMPLANT
COVER PROBE 5X48 (BAG) ×4
DEVICE RAD COMP TR BAND LRG (VASCULAR PRODUCTS) ×1 IMPLANT
ELECT DEFIB PAD ADLT CADENCE (PAD) ×1 IMPLANT
GLIDESHEATH SLEND A-KIT 6F 22G (SHEATH) ×1 IMPLANT
GUIDELINER 6F (CATHETERS) ×1 IMPLANT
HOVERMATT SINGLE USE (MISCELLANEOUS) ×1 IMPLANT
KIT HEART LEFT (KITS) ×2 IMPLANT
PACK CARDIAC CATHETERIZATION (CUSTOM PROCEDURE TRAY) ×2 IMPLANT
STENT RESOLUTE INTEG 3.0X12 (Permanent Stent) ×1 IMPLANT
STENT RESOLUTE INTEG 3.0X22 (Permanent Stent) ×1 IMPLANT
STOPCOCK MORSE 400PSI 3WAY (MISCELLANEOUS) ×1 IMPLANT
TRANSDUCER W/STOPCOCK (MISCELLANEOUS) ×2 IMPLANT
TUBING CIL FLEX 10 FLL-RA (TUBING) ×2 IMPLANT
WIRE ASAHI FIELDER XT 190CM (WIRE) ×1 IMPLANT
WIRE ASAHI GRAND SLAM 180CM (WIRE) ×1 IMPLANT
WIRE ASAHI PROWATER 180CM (WIRE) ×1 IMPLANT
WIRE SAFE-T 1.5MM-J .035X260CM (WIRE) ×1 IMPLANT

## 2015-08-12 NOTE — Progress Notes (Signed)
CRITICAL VALUE ALERT  Critical value received:  Trop > 65  Date of notification:  08/12/15  Time of notification:  [id number]  Critical value read back: yes  Nurse who received alert:  P.Burton Apley RN  MD notified (1st page):  MD aware- Dr Ellyn Hack  Time of first page:  1850  MD notified (2nd page):  Time of second page:  Responding MD:  Dr Ellyn Hack  Time MD responded:  [phone]

## 2015-08-12 NOTE — Care Management Note (Signed)
Case Management Note  Patient Details  Name: Leonard [name] MRN: [id number] Date of Birth: 08/10/1945  Subjective/Objective:      Adm w stemi              Action/Plan:  lives w wife  Expected Discharge Date:                  Expected Discharge Plan:  Home/Self Care  In-House Referral:     Discharge planning Services  CM Consult, Medication Assistance  Post Acute Care Choice:    Choice offered to:     DME Arranged:    DME Agency:     HH Arranged:    HH Agency:     Status of Service:     If discussed at [employer], dates discussed:    Additional Comments: gave pt 30day free brilinta card.  Lacretia Leigh, RN 08/12/2015, 3:02 PM

## 2015-08-12 NOTE — Progress Notes (Signed)
Dr Ellyn Hack notified of pts positive MRSA screen

## 2015-08-12 NOTE — Progress Notes (Signed)
Chief Complaint  Patient presents with  . Heartburn    ate some spicy foods last week and had some heartburn. Diagnosed with pyloric spasm. Also has had some tightness in the shoulder blade area but that has gone away some. Both arms has been aching as well but he thinks from a fall in his driveway x 3 years ago. Started some prilosec Sat and took Sat and yesterday-helped.   Developed bad heartburn around noon 2 days ago. This was prior to eating.  He took Tums at noon which helped.  He had a lot of spicy foods over the last week. +belching. He took prilosec yesterday and the day prior.  This and the Tums seem helpful.  He doesn't get significant activity.  He denies chest pain, shortness of breath.  He has some right arm pain which he reports is chronic.  He denies nausea, jaw pain, not currently complaining of heartburn.  Cardiac risk factors include HTN, hyperlipidemia, pre-diabetes, male sex. FH of possibly early heart disease in paternal grandparents (but his father lived to be 24).      Chart reviewed-- Impaired fasting glucose/prediabetes, with last A1c 6.1% in March 2017. He had elevated LFT's and Cr 1.27 at that time as well.  Past Medical History:  Diagnosis Date  . Anxiety   . Hyperlipidemia   . Hypertension    History reviewed. No pertinent surgical history.  Social History   Social History  . Marital status: Married    Spouse name: N/A  . Number of children: N/A  . Years of education: N/A   Occupational History  . Not on file.   Social History Main Topics  . Smoking status: Never Smoker  . Smokeless tobacco: Not on file  . Alcohol use 1.8 - 2.4 oz/week    3 - 4 Shots of liquor per week     Comment: 3-4 glasses of scotch 3-4 days per week  . Drug use: No  . Sexual activity: Not on file   Other Topics Concern  . Not on file   Social History Narrative  . No narrative on file   Family History  Problem Relation Age of Onset  . Diabetes Mother   .  Heart disease Paternal Grandmother   . Heart disease Paternal Grandfather      Outpatient Encounter Prescriptions as of 08/12/2015  Medication Sig  . amLODipine (NORVASC) 5 MG tablet TAKE 1 TABLET BY MOUTH DAILY  . aspirin 81 MG tablet Take 81 mg by mouth daily.  . cholecalciferol (VITAMIN D) 1000 units tablet Take 1,000 Units by mouth daily.  . Coenzyme Q10 (CO Q 10 PO) Take 1 tablet by mouth daily.  . fenofibrate 160 MG tablet Take 160 mg by mouth daily.  . metoprolol succinate (TOPROL-XL) 25 MG 24 hr tablet Take 1 tablet (25 mg total) by mouth daily.  . metoprolol tartrate (LOPRESSOR) 25 MG tablet TAKE 1 TABLET(25 MG) BY MOUTH DAILY  . Multiple Vitamin (MULTIVITAMIN) tablet Take 1 tablet by mouth daily.  . Omega-3 Fatty Acids (FISH OIL) 1200 MG CPDR Take 1 tablet by mouth daily.  . tadalafil (CIALIS) 20 MG tablet Take 20 mg by mouth daily as needed for erectile dysfunction.  . valsartan (DIOVAN) 320 MG tablet TAKE 1 TABLET BY MOUTH EVERY DAY  . valsartan (DIOVAN) 320 MG tablet TAKE 1 TABLET BY MOUTH EVERY DAY  . venlafaxine (EFFEXOR) 75 MG tablet TAKE 1 TABLET BY MOUTH EVERY DAY   No facility-administered encounter medications  on file as of 08/12/2015.    Allergies  Allergen Reactions  . Other     ragweed       PHYSICAL EXAM:  BP 112/70   Pulse 92   Ht 6' 1.5" (1.867 m)   Wt (!) 310 lb (140.6 kg)   BMI 40.35 kg/m   Diaphoretic male, speaking comfortably Heart: tachycardic, no murmur Lungs: clear Extremities: no significant edema noted Abdomen: obese  EKG: sinus tach.  ST depressions noted, as well as TWI.  Cannot r/o acute MI   ASSESSMENT/PLAN:  Chest pain, rule out acute myocardial infarction  Chest pain, unspecified chest pain type - Plan: EKG 12-Lead  EKG, abnormal    Rule out MI   Paramedics called to transport to ER for evaluation

## 2015-08-12 NOTE — Progress Notes (Signed)
Leonard [name] responded to a Code STEMI. Patient was taken directly to the Cath Lab. I met the wife in the ED waiting room and escorted her to [facility] waiting area. I checked in with the Cath Lab and let them know family was in the waiting area. I offered the ministry of presence, listening, hospitality and prayer. I am available for follow up as needed.  Leonard [name] [name]    08/12/15 1000  Clinical Encounter Type  Visited With Family;Patient not available  Visit Type Initial;Code;Other (Comment) (STEMI)  Referral From Nurse  Spiritual Encounters  Spiritual Needs Prayer;Emotional;Grief support  Stress Factors  Patient Stress Factors Health changes

## 2015-08-12 NOTE — H&P (Addendum)
History and Physical Interval Note:  NAME:  Leonard [name]   MRN: [id number] DOB:  February 22, 1945   ADMIT DATE: 08/12/2015   08/12/2015 9:39 AM  Leonard [name] is a 70 y.o. male history of hypertension, hyperlipidemia, obesity and borderline diabetes who was in his usual state of health until roughly Saturday morning when he started having what he felt was heartburn-type discomfort in his chest. The episodes occurred off and on on Saturday, but on Sunday he was he was symptom-free. He went to his primary care physician's office this morning Monday, July 24 to discuss his "heartburn", EKG performed by PCP shows inferior ST elevations with Q waves in leads [name] and aVF. Right-sided EKG showed some elevations in V4. Consistent with possible posterior infarct. Patient was chest pain-free, however given the very concerning EKG findings see was transported emergently to [facility]. Upon arrival here he is anxious, but is pain-free. However I felt that with the uterus elevations and concern for occluded vessel that the best option would be to proceed with urgent catheterization. This is likely subacute presentation of a inferior posterior MI.  Review of Systems  Constitutional: Negative.   HENT: Negative.   Respiratory: Negative for cough and shortness of breath.   Cardiovascular: Positive for chest pain. Negative for palpitations and PND.  Gastrointestinal: Positive for heartburn (what he thought was heartburn). Negative for blood in stool and melena.  Genitourinary: Negative for hematuria.  Musculoskeletal: Negative for myalgias.  Skin: Negative.   Neurological: Negative for dizziness.  Endo/Heme/Allergies: Does not bruise/bleed easily.  Psychiatric/Behavioral: Negative for depression and memory loss. The patient is not nervous/anxious and does not have insomnia.     Past Medical History:  Diagnosis Date  . Anxiety   . Hyperlipidemia   . Hypertension    No past surgical history on  file.  FAMHx: Family History  Problem Relation Age of Onset  . Diabetes Mother   . Heart disease Paternal Grandmother   . Heart disease Paternal Grandfather     SOCHx:  reports that he has never smoked. He does not have any smokeless tobacco history on file. He reports that he drinks about 1.8 - 2.4 oz of alcohol per week . He reports that he does not use drugs.  ALLERGIES: Allergies  Allergen Reactions  . Other     ragweed    HOME MEDICATIONS: Prescriptions Prior to Admission  Medication Sig Dispense Refill Last Dose  . amLODipine (NORVASC) 5 MG tablet TAKE 1 TABLET BY MOUTH DAILY 90 tablet 0 Taking  . aspirin 81 MG tablet Take 81 mg by mouth daily.   Taking  . cholecalciferol (VITAMIN D) 1000 units tablet Take 1,000 Units by mouth daily.   Taking  . Coenzyme Q10 (CO Q 10 PO) Take 1 tablet by mouth daily.   Taking  . fenofibrate 160 MG tablet Take 160 mg by mouth daily.   Taking  . metoprolol succinate (TOPROL-XL) 25 MG 24 hr tablet Take 1 tablet (25 mg total) by mouth daily. 90 tablet 1 Taking  . Multiple Vitamin (MULTIVITAMIN) tablet Take 1 tablet by mouth daily.   Taking  . Omega-3 Fatty Acids (FISH OIL) 1200 MG CPDR Take 1 tablet by mouth daily.   Taking  . tadalafil (CIALIS) 20 MG tablet Take 20 mg by mouth daily as needed for erectile dysfunction.   Taking  . valsartan (DIOVAN) 320 MG tablet TAKE 1 TABLET BY MOUTH EVERY DAY 90 tablet 0 Taking  . venlafaxine (  EFFEXOR) 75 MG tablet TAKE 1 TABLET BY MOUTH EVERY DAY 90 tablet 0 Taking   PHYSICAL EXAM General appearance: alert, cooperative, mild distress and morbidly obese Neck: no adenopathy, no carotid bruit, no JVD, supple, symmetrical, trachea midline and thyroid not enlarged, symmetric, no tenderness/mass/nodules Lungs: clear to auscultation bilaterally, normal percussion bilaterally and Maybe mild basal crackles that clear with cough Heart: regular rate and rhythm, S1, S2 normal, no murmur, click, rub or  gallop Abdomen: soft, non-tender; bowel sounds normal; no masses,  no organomegaly Extremities: extremities normal, atraumatic, no cyanosis or edema Pulses: 2+ and symmetric Skin: Skin color, texture, turgor normal. No rashes or lesions Neurologic: Mental status: Alert, oriented, thought content appropriate Cranial nerves: normal   Adult ECG Report  Rate: 103 ;  Rhythm: sinus tachycardia  QRS Axis: -14 ;  PR Interval: 166 ;  QRS Duration: 78 ; QTc: 448;  Voltages: Mildly reduced but otherwise normal  Conduction Disturbances: none  Other Abnormalities: 1-2 mm elevations in II, room 3 as well as aVF with Q waves noted in room 3 and aVF. ST depressions noted in aVL and V2. There are also subtle lateral biphasic ST segments with T-wave inversions in V5 and V6.   Narrative Interpretation: Most likely inferior (and based on right-sided EKG, posterior) STEMI. Based on presentation is acute/recent  IMPRESSION & PLAN The patients' history has been reviewed, patient examined, no change in status from most recent note, stable for surgery. I have reviewed the patients' chart and labs. Questions were answered to the patient's satisfaction.    Leonard [name] has presented today for surgery, with the diagnosis of likely subacute presentation of inferior-posterior STEMI. He is currently chest pain-free, however has a very concerning EKG and therefore was referred for urgent cardiac catheterization plus/PCI.  The various methods of treatment have been discussed with the patient and family.   Risks / Complications include, but not limited to: Death, MI, CVA/TIA, VF/VT (with defibrillation), Bradycardia (need for temporary pacer placement), contrast induced nephropathy, bleeding / bruising / hematoma / pseudoaneurysm, vascular or coronary injury (with possible emergent CT or Vascular Surgery), adverse medication reactions, infection.     After consideration of risks, benefits and other options for  treatment, the patient has consented to Procedure(s):  LEFT HEART CATHETERIZATION AND CORONARY ANGIOGRAPHY +/- AD [place]  as a surgical intervention.  Emergency consent was implied  We will proceed with the planned procedure.   [facility] [address] [place], [place]  [id number]  [phone]  08/12/2015 9:39 AM

## 2015-08-12 NOTE — Progress Notes (Signed)
H&P Addendum:  PMH  Hypertension, Hyperlipidemia    Surgical History: Minor skin surgery and hemorrhoid surgery.   FHx: Father died when he was late 42s years old. Mother died in her 5s. Paternal grandmother had MI.   SHx: never smoked. Used to drink on a daily basis, however has since cut back. Denies using any illicit drug.     Hilbert Corrigan PA Pager: [phone]

## 2015-08-12 NOTE — Progress Notes (Signed)
Received notification that pt tested positive for MRSA via nasal swab. Pt placed on contact isolation - MRSA order set started and pt and family educated on MRSA and isolation procedures.

## 2015-08-13 ENCOUNTER — Inpatient Hospital Stay (HOSPITAL_COMMUNITY): Payer: Medicare Other

## 2015-08-13 DIAGNOSIS — I2119 ST elevation (STEMI) myocardial infarction involving other coronary artery of inferior wall: Secondary | ICD-10-CM | POA: Diagnosis not present

## 2015-08-13 DIAGNOSIS — I2510 Atherosclerotic heart disease of native coronary artery without angina pectoris: Secondary | ICD-10-CM

## 2015-08-13 LAB — CBC
HEMATOCRIT: 45.5 % (ref 39.0–52.0)
Hemoglobin: 15.1 g/dL (ref 13.0–17.0)
MCH: 32.9 pg (ref 26.0–34.0)
MCHC: 33.2 g/dL (ref 30.0–36.0)
MCV: 99.1 fL (ref 78.0–100.0)
Platelets: 220 10*3/uL (ref 150–400)
RBC: 4.59 MIL/uL (ref 4.22–5.81)
RDW: 14.5 % (ref 11.5–15.5)
WBC: 20.7 10*3/uL — AB (ref 4.0–10.5)

## 2015-08-13 LAB — BASIC METABOLIC PANEL
Anion gap: 9 (ref 5–15)
BUN: 20 mg/dL (ref 6–20)
CHLORIDE: 97 mmol/L — AB (ref 101–111)
CO2: 25 mmol/L (ref 22–32)
Calcium: 8.6 mg/dL — ABNORMAL LOW (ref 8.9–10.3)
Creatinine, Ser: 1.52 mg/dL — ABNORMAL HIGH (ref 0.61–1.24)
GFR, EST AFRICAN AMERICAN: 51 mL/min — AB (ref >60)
GFR, EST NON AFRICAN AMERICAN: 44 mL/min — AB (ref >60)
Glucose, Bld: 115 mg/dL — ABNORMAL HIGH (ref 65–99)
POTASSIUM: 4.2 mmol/L (ref 3.5–5.1)
SODIUM: 131 mmol/L — AB (ref 135–145)

## 2015-08-13 LAB — ECHOCARDIOGRAM COMPLETE
Height: 73.5 in
WEIGHTICAEL: 5037.07 [oz_av]

## 2015-08-13 LAB — HEMOGLOBIN A1C
Hgb A1c MFr Bld: 6.1 % — ABNORMAL HIGH (ref 4.8–5.6)
Mean Plasma Glucose: 128 mg/dL

## 2015-08-13 LAB — TROPONIN I: TROPONIN I: 54.84 ng/mL — AB (ref <0.03)

## 2015-08-13 MED ORDER — ATORVASTATIN CALCIUM 80 MG PO TABS
80.0000 mg | ORAL_TABLET | Freq: Every day | ORAL | Status: DC
  Administered 2015-08-13: 80 mg via ORAL
  Filled 2015-08-13: qty 1

## 2015-08-13 MED ORDER — PERFLUTREN LIPID MICROSPHERE
1.0000 mL | INTRAVENOUS | Status: AC | PRN
  Administered 2015-08-13: 10:00:00 via INTRAVENOUS
  Filled 2015-08-13: qty 10

## 2015-08-13 MED ORDER — PERFLUTREN LIPID MICROSPHERE
INTRAVENOUS | Status: AC
  Filled 2015-08-13: qty 10

## 2015-08-13 NOTE — Progress Notes (Signed)
CARDIAC REHAB PHASE I   PRE:  Rate/Rhythm: 95 SR  BP:  Sitting: 101/63        SaO2: 93 RA  MODE:  Ambulation: 350 ft   POST:  Rate/Rhythm: 120 ST c/ PACs  BP:  Sitting: 136/75         SaO2: 97 RA  Pt incontinent of stool while sitting in recliner. Pt ambulated 350 ft on RA, assist x1, mildly unsteady gait, tolerated fairly well. Pt had significant DOE, c/o hip pain, fatigue, seemed to be rushing, standing rest x 3. HR elevated. Upon return to room, pt states he was experiencing anxiety while walking as he was worried about his wife who had not called him, states he felt he was going to have a panic attack. Pt spoke with wife, reports less anxiety. Pt would benefit from use of rolling walker for ambulation and more frequent rest stops, slower pace. Left MI book and heart healthy diet handout for pt to review. Pt to recliner after walk, call bell within reach. Will follow.    [place], RN, BSN 08/13/2015 3:36 PM `

## 2015-08-13 NOTE — Progress Notes (Signed)
Patient Name:  Leonard [name], DOB: [birth year], MRN: [id number] Primary Doctor: [name], NP Primary Cardiologist:   Date: 08/13/2015   SUBJECTIVE: The patient underwent intervention for his acute MI yesterday. I've copied the procedure and the note below. The procedure was quite difficult. Patient has significant troponin elevation. Left ventriculogram was not done because of high contrast use. 2-D echo is not yet available.  The patient said he was on a cholesterol medication in the past that was eventually stopped. He does not remember why it was stopped and does not remember any particular problems.   Past Medical History:  Diagnosis Date  . Anxiety   . Hyperlipidemia   . Hypertension    Vitals:   08/13/15 0400 08/13/15 0500 08/13/15 0600 08/13/15 0908  BP: 124/65 112/72 114/75 113/68  Pulse: 88 92 98 97  Resp: (!) 29 (!) 30 (!) 29 18  Temp: 99 F (37.2 C)   98 F (36.7 C)  TempSrc: Oral   Oral  SpO2: 95% 93% 93% 96%  Weight:        Intake/Output Summary (Last 24 hours) at 08/13/15 1037 Last data filed at 08/13/15 0500  Gross per 24 hour  Intake             1092 ml  Output              900 ml  Net              192 ml   Filed Weights   08/12/15 1400  Weight: (!) 314 lb 13.1 oz (142.8 kg)     LABS: Basic Metabolic Panel:  Recent Labs  08/12/15 1005 08/12/15 1007 08/13/15 0441  NA 128* 131* 131*  K 4.5 4.6 4.2  CL 98* 95* 97*  CO2 22  --  25  GLUCOSE 148* 148* 115*  BUN 19 22* 20  CREATININE 1.81* 1.80* 1.52*  CALCIUM 9.0  --  8.6*   Liver Function Tests:  Recent Labs  08/12/15 1005  AST 177*  ALT 84*  ALKPHOS 68  BILITOT 1.2  PROT 6.9  ALBUMIN 3.6   No results for input(s): LIPASE, AMYLASE in the last 72 hours. CBC:  Recent Labs  08/12/15 1005 08/12/15 1007 08/13/15 0441  WBC 22.6*  --  20.7*  HGB 16.3 17.3* 15.1  HCT 48.0 51.0 45.5  MCV 97.6  --  99.1  PLT 249  --  220   Cardiac Enzymes:  Recent Labs   08/12/15 1005 08/12/15 1747 08/12/15 2246 08/13/15 0441  CKTOTAL 1,135*  --   --   --   CKMB 139.3*  --   --   --   TROPONINI  --  >65.00* >65.00* 54.84*   BNP: Invalid input(s): POCBNP D-Dimer: No results for input(s): DDIMER in the last 72 hours. Thyroid Function Tests: No results for input(s): TSH, T4TOTAL, T3FREE, THYROIDAB in the last 72 hours.  Invalid input(s): FREET3  RADIOLOGY: No results found.  PHYSICAL EXAM  Patient is overweight. He is oriented to person time and place. Affect is normal. Lungs reveal a few scattered rhonchi. Cardiac exam reveals an S1 and S2. The abdomen is soft. There is no peripheral edema.   TELEMETRY: I have reviewed telemetry today August 13, 2015. There is normal sinus rhythm with some intermittent mild sinus tachycardia.  ECG:     EKG reveals persistent mild inferior ST elevation with inferior Q waves.  Conclusion :   Cardiac catheterization done  August 12, 2015.    Very tortuous RCA with mild proximal disease.-  A STENT RESOLUTE INTEG 3.0X22 drug eluting stent was successfully placed - covering the more proximal occluded segment.  A STENT RESOLUTE INTEG 3.0X12 drug-eluting stent was successfully placed overlapping proximally with the original stent due to a focal lesion upstream.  2nd RPLB lesion, 100 %stenosed. likely distal embolization. There was dye hang-up at this stenosis. 2 small for PCI.  Ost LAD lesion, 40 %stenosed. Ramus lesion, 40 %stenosed.  There is no aortic valve stenosis.  Dist RCA lesion, 90 %stenosed. Post intervention - balloon angioplasty only, there is a 10% residual stenosis.  Mid RCA lesion, 100 %stenosed.  Post intervention, there is a 0% residual stenosis.    Subacute presentation for inferior MI with post infarction angina on the following day. EKG still shows injury current. Therefore decided to proceed with intervention.  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 Final successful stenting with 2 overlapping stents 3.0 mm x 20 mm overlap approximately with 3.0 mm x 12 mm (postdilated to 3.4 mm). The distal lesion was treated with angioplasty only, after the more proximal stent was placed this no longer appeared to be a significant lesion. There were several small distal posterolateral branches, one of which appears to increase occluded still with distal thrombus. Flow was improving at the end of the case, too small for PTCA. The 38 mm and 26 mm stents were not able to be used, as it is not advanced around the shepherd's crook bend.  Left Ventriculogram was not performed because of the prolonged case extensive amount of contrast. Due to the length of the procedure, the radiation time was 41.5 minutes. However different angiographic imaging angles were used. The patient was evaluated post procedure and did not have any evidence of any radiation burn.  Plan:  Admit to CCU for post MI care. TR band removal per protocol.  He is already on beta blocker and ARB. I will start half of his original dose of ARB.  Not on statin, is on fenofibrate. Will need to address prior to discharge.  Cardiac Rehabilitation Consultation  We will recheck a 2-D echocardiogram to assess his EF given no LV gram.   Glenetta Hew, M.D., M.S. Interventional Cardiologist     ASSESSMENT AND PLAN:     ST elevation myocardial infarction (STEMI) of true posterior wall, initial episode of care [facility])     The patient presented with persistent EKG changes after his actual symptoms the day before. His cath was long and difficult. All areas could not be revascularized. He has significant troponin elevation. He has persistent inferior ST elevation. Clinically he is stable today. We will begin to increase his ambulation.    Hyperlipidemia      The patient was on a cholesterol medication in the past that was stopped. He does not remember having any particular problems. I will start  high-dose Lipitor today.   Dola Argyle 08/13/2015 10:37 AM

## 2015-08-13 NOTE — Progress Notes (Signed)
  Echocardiogram 2D Echocardiogram with Definity has been performed.  Jennette Dubin 08/13/2015, 10:18 AM

## 2015-08-13 NOTE — Progress Notes (Signed)
EKG CRITICAL VALUE     12 lead EKG performed.  Critical Wells Guiles, RN notified.   Yehuda Mao, [place] 08/13/2015 7:41 AM

## 2015-08-13 NOTE — Progress Notes (Signed)
Spoke w pt and family. Pt does have medicare d plan that covers meds. He will use 30day free card first.

## 2015-08-13 NOTE — Progress Notes (Signed)
Patient arrived the unit from [facility] assessment completed see flowsheet, placed on tele ccmd notified,patient oriented to room and staff, bed in lowest position , call light within reach, will continue to monitor

## 2015-08-14 ENCOUNTER — Inpatient Hospital Stay (HOSPITAL_COMMUNITY): Payer: Medicare Other

## 2015-08-14 ENCOUNTER — Encounter (HOSPITAL_COMMUNITY): Payer: Self-pay | Admitting: Cardiology

## 2015-08-14 ENCOUNTER — Telehealth: Payer: Self-pay | Admitting: Cardiology

## 2015-08-14 DIAGNOSIS — D729 Disorder of white blood cells, unspecified: Secondary | ICD-10-CM

## 2015-08-14 DIAGNOSIS — R7309 Other abnormal glucose: Secondary | ICD-10-CM

## 2015-08-14 DIAGNOSIS — D72829 Elevated white blood cell count, unspecified: Secondary | ICD-10-CM

## 2015-08-14 DIAGNOSIS — R7989 Other specified abnormal findings of blood chemistry: Secondary | ICD-10-CM

## 2015-08-14 DIAGNOSIS — R945 Abnormal results of liver function studies: Secondary | ICD-10-CM

## 2015-08-14 DIAGNOSIS — N1832 Chronic kidney disease, stage 3b: Secondary | ICD-10-CM

## 2015-08-14 DIAGNOSIS — N183 Chronic kidney disease, stage 3 unspecified: Secondary | ICD-10-CM

## 2015-08-14 MED ORDER — IRBESARTAN 150 MG PO TABS: 150.0000 mg | ORAL_TABLET | Freq: Every day | ORAL | 12 refills | Status: DC

## 2015-08-14 MED ORDER — ATORVASTATIN CALCIUM 80 MG PO TABS: 80.0000 mg | ORAL_TABLET | Freq: Every day | ORAL | 12 refills | Status: DC

## 2015-08-14 MED ORDER — TICAGRELOR 90 MG PO TABS: 90.0000 mg | ORAL_TABLET | Freq: Two times a day (BID) | ORAL | 12 refills | Status: DC

## 2015-08-14 MED ORDER — ASPIRIN EC 81 MG PO TBEC: 81.0000 mg | DELAYED_RELEASE_TABLET | Freq: Every day | ORAL | Status: DC

## 2015-08-14 MED ORDER — TICAGRELOR 90 MG PO TABS: 90.0000 mg | ORAL_TABLET | Freq: Two times a day (BID) | ORAL | 0 refills | Status: DC

## 2015-08-14 MED ORDER — NITROGLYCERIN 0.4 MG SL SUBL: 0.4000 mg | SUBLINGUAL_TABLET | SUBLINGUAL | 2 refills | Status: DC | PRN

## 2015-08-14 NOTE — Care Management Note (Signed)
Case Management Note Previous CM note initiated by Donne Anon RN, CM  Patient Details  Name: Deavon Diebel III MRN: [id number] Date of Birth: Sep 25, 1945  Subjective/Objective:      Adm w stemi              Action/Plan:  lives w wife  7/26- pt tx from [place] to 2W on 7/25  Expected Discharge Date:    08/14/15              Expected Discharge Plan:  Home/Self Care  In-House Referral:     Discharge planning Services  CM Consult, Medication Assistance  Post Acute Care Choice:    Choice offered to:     DME Arranged:    DME Agency:     HH Arranged:    [place] Agency:     Status of Service:  Completed, signed off  If discussed at [employer], dates discussed:    Additional Comments: gave pt 30day free brilinta card.  08/14/15- 1130- Marvetta Gibbons RN, CM- f/u done with pt regarding Brilinta- insurance check submitted- S/W JUDY M. @ [employer]  # [phone]   BRILINTA 90 MG BID ( 30 ) 60 TAB  COVER- YES  CO-PAY- $ 60.00     TIER- 3 DRUG  PRIOR APPROVAL- NO  PHARMACY : [facility], [place] [facility]   Call made to pt's pharmacy- who states that they have a partial fill of 20 tablets and will have to order more in - spoke with pt at bedside and explained 30 day free card along with his insurance copay coverage- no further CM needs noted.   Dahlia Client [place], RN 08/14/2015, 11:43 AM [phone]

## 2015-08-14 NOTE — Progress Notes (Signed)
Per Clinical biochemist for [employer]. @ [employer]  # [phone]   BRILINTA 90 MG BID ( 30 ) 60 TAB  COVER- YES  CO-PAY- $ 60.00     TIER- 3 DRUG  PRIOR APPROVAL- NO  PHARMACY : [facility], [facility] AND [facility]

## 2015-08-14 NOTE — Telephone Encounter (Signed)
New message     Erin PA calling for TCM and Pharmacy appt,    08/22/2015     Time: 9:30 AM    Visit Type: OFFICE VISIT 30 [368]    Provider: Rogelia Mire, NP      08/22/2015     Time: 9:15 AM    Visit Type: OFFICE VISIT [1004]    Provider: [facility]

## 2015-08-14 NOTE — Progress Notes (Signed)
Patient in a stable condition, this RN went over discharge teachings with patient they verbalised understanding, tele dc,ccmd notified, iv removed, patient belongings at bedside, patient taken off the unit on a wheelchair by the hospital volunteer

## 2015-08-14 NOTE — Progress Notes (Signed)
Patient Name:  Leonard [name], DOB: 1945-11-05, MRN: [id number] Primary Doctor: [name], NP Primary Cardiologist:   Date: 08/14/2015   SUBJECTIVE: The patient is stable today. He's not had any recurrent chest pain. Rhythm has been stable. He became anxious during the day yesterday morning about his wife. He thought he was going to have a panic attack. This stabilized and he did fine. He is anxious to go home.   Past Medical History:  Diagnosis Date  . Anxiety   . Hyperlipidemia   . Hypertension    Vitals:   08/13/15 1556 08/13/15 1649 08/13/15 2208 08/14/15 0504  BP: 99/73 100/66 106/62 110/71  Pulse: 98 96 95 88  Resp: 17 18 18 18   Temp: 98.8 F (37.1 C) 98.7 F (37.1 C) 98.4 F (36.9 C) 98.3 F (36.8 C)  TempSrc: Oral Oral Oral Oral  SpO2: 94% 95% 95% 95%  Weight:        Intake/Output Summary (Last 24 hours) at 08/14/15 0617 Last data filed at 08/13/15 2200  Gross per 24 hour  Intake              360 ml  Output              150 ml  Net              210 ml   Filed Weights   08/12/15 1400  Weight: (!) 314 lb 13.1 oz (142.8 kg)     LABS: Basic Metabolic Panel:  Recent Labs  08/12/15 1005 08/12/15 1007 08/13/15 0441  NA 128* 131* 131*  K 4.5 4.6 4.2  CL 98* 95* 97*  CO2 22  --  25  GLUCOSE 148* 148* 115*  BUN 19 22* 20  CREATININE 1.81* 1.80* 1.52*  CALCIUM 9.0  --  8.6*   Liver Function Tests:  Recent Labs  08/12/15 1005  AST 177*  ALT 84*  ALKPHOS 68  BILITOT 1.2  PROT 6.9  ALBUMIN 3.6   No results for input(s): LIPASE, AMYLASE in the last 72 hours. CBC:  Recent Labs  08/12/15 1005 08/12/15 1007 08/13/15 0441  WBC 22.6*  --  20.7*  HGB 16.3 17.3* 15.1  HCT 48.0 51.0 45.5  MCV 97.6  --  99.1  PLT 249  --  220   Cardiac Enzymes:  Recent Labs  08/12/15 1005 08/12/15 1747 08/12/15 2246 08/13/15 0441  CKTOTAL 1,135*  --   --   --   CKMB 139.3*  --   --   --   TROPONINI  --  >65.00* >65.00* 54.84*    BNP: Invalid input(s): POCBNP D-Dimer: No results for input(s): DDIMER in the last 72 hours. Thyroid Function Tests: No results for input(s): TSH, T4TOTAL, T3FREE, THYROIDAB in the last 72 hours.  Invalid input(s): FREET3  RADIOLOGY: No results found.  PHYSICAL EXAM  Patient is oriented to person time and place. Affect is normal. Lungs reveal a few scattered rhonchi. Cardiac exam reveals S1 and S2. He is obese. Abdomen is soft. There is no significant peripheral edema.   TELEMETRY: I have reviewed telemetry today August 14, 2015. There is normal sinus rhythm with PACs.   2-D echo:   Echo was done yesterday showing an ejection fraction in the 45-50% range. However he has significant focal wall motion abnormalities with akinesis of the mid/base inferior segments and inferoseptal segments. There is also hypokinesis of the apical inferior segment.  ASSESSMENT AND PLAN:  Hyperlipidemia LDL goal <70     Historically the patient has had some elevation of his LFTs. He has mild elevation at this time. I felt it was very important to start a statin. His liver function studies will have to be followed very carefully. The should be rechecked in one of his early post hospital follow-up visits. He needs early careful follow-up with his primary physician.    ST elevation myocardial infarction (STEMI) of true posterior wall, initial episode of care [facility])     The patient is stable after his acute MI. His intervention in the cath lab was complex. He does have significant residual focal wall motion abnormalities. He had significant troponin elevation. Ejection fraction is in the 45-50% range. He is on an ARB and a small dose of a beta blocker. He is on aspirin and Brilinta. Cardiac rehabilitation is underway.    Elevated LFTs    He is had mild LFT elevation in the past. We have started him on high-dose statin. He will need very early follow-up of his LFTs.    Chronic kidney disease (CKD), stage [name]  (moderate)     Creatinine has gone from 1.8-1.5 during this hospitalization. This will have to be followed carefully as an outpatient.    Elevated WBC count    He's had an elevated white count in the past. I'm not sure what the overall assessment was. However it is elevated again at this time without signs of infection. This will need early follow-up with his primary team.     Elevated glucose       This will need follow-up soon with his primary care team.  As I review his record this morning. I see that a chest x-ray has not been done. I have ordered a two-view chest x-ray to be done this morning.  If the patient's chest x-ray shows no obvious abnormalities that need further in-hospital evaluation, he can be discharged home today. He needs early post hospital follow-up with cardiology. He needs early post hospital follow-up with his primary care team.   Dola Argyle 08/14/2015 6:17 AM

## 2015-08-14 NOTE — Progress Notes (Signed)
CARDIAC REHAB PHASE I   PRE:  Rate/Rhythm: 89 SR  BP:  Sitting: 99/58        SaO2: 97 RA  MODE:  Ambulation: 350 ft   POST:  Rate/Rhythm: 108 ST  BP:  Sitting: 119/66         SaO2: 98 RA  Pt ambulated 350 ft on RA, rolling walker (pt uses a cane at home), steady gait, tolerated fairly well. Pt c/o mild DOE (much improved since yesterday), denies any other complaints, declined rest stop. Pt generally anxious, states he has been this way since his wife passed away unexpectedly after 55 years of marriage. Reviewed risk factors, MI book, anti-platelet therapy, stent card, activity restrictions, ntg, exercise, heart healthy diet, carb counting, portion control, sodium restrictions and phase 2 cardiac rehab. Pt verbalized understanding, receptive to education. Pt agrees to phase 2 cardiac rehab referral, will send to [facility] per pt request. Pt to recliner after walk, call bell within reach, awaiting discharge.    [id number] Lenna Sciara, RN, BSN 08/14/2015 10:44 AM

## 2015-08-14 NOTE — Discharge Summary (Signed)
Discharge Summary    Patient ID: Leonard [name],  MRN: [id number], DOB/AGE: 1945-05-13 70 y.o.  Admit date: 08/12/2015 Discharge date: 08/14/2015  Primary Care Provider: Harland [name] Primary Cardiologist: Dr. Ellyn [name]  Discharge Diagnoses    Active Problems:   Hyperlipidemia LDL goal <70   ST elevation myocardial infarction (STEMI) of true posterior wall, initial episode of care [facility])   Elevated LFTs   Chronic kidney disease (CKD), stage III (moderate)   Elevated WBC count   Elevated glucose   Allergies Allergies  Allergen Reactions  . Other     ragweed  . Sulfa Antibiotics Other (See Comments)    Pt unsure    Diagnostic Studies/Procedures  Left Heart Cath and Coronary Angiography 08/12/15  Very tortuous RCA with mild proximal disease.-  A STENT RESOLUTE INTEG 3.0X22 drug eluting stent was successfully placed - covering the more proximal occluded segment.  A STENT RESOLUTE INTEG 3.0X12 drug-eluting stent was successfully placed overlapping proximally with the original stent due to a focal lesion upstream.  2nd RPLB lesion, 100 %stenosed. likely distal embolization. There was dye hang-up at this stenosis. 2 small for PCI.  Ost LAD lesion, 40 %stenosed. Ramus lesion, 40 %stenosed.  There is no aortic valve stenosis.  Dist RCA lesion, 90 %stenosed. Post intervention - balloon angioplasty only, there is a 10% residual stenosis.  Mid RCA lesion, 100 %stenosed.  Post intervention, there is a 0% residual stenosis.    Subacute presentation for inferior MI with post infarction angina on the following day. EKG still shows injury current. Therefore decided to proceed with intervention.  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 Final successful stenting with 2 overlapping stents 3.0 mm x 20 mm overlap approximately with 3.0 mm x 12 mm (postdilated to 3.4 mm). The distal lesion was treated with angioplasty only, after the  more proximal stent was placed this no longer appeared to be a significant lesion. There were several small distal posterolateral branches, one of which appears to increase occluded still with distal thrombus. Flow was improving at the end of the case, too small for PTCA. The 38 mm and 26 mm stents were not able to be used, as it is not advanced around the shepherd's crook bend.  Left Ventriculogram was not performed because of the prolonged case extensive amount of contrast. Due to the length of the procedure, the radiation time was 41.5 minutes. However different angiographic imaging angles were used. The patient was evaluated post procedure and did not have any evidence of any radiation burn.  Plan:  Admit to CCU for post MI care. TR band removal per protocol.  He is already on beta blocker and ARB. I will start half of his original dose of ARB.  Not on statin, is on fenofibrate. Will need to address prior to discharge.  Cardiac Rehabilitation Consultation  We will recheck a 2-D echocardiogram to assess his EF given no LV gram.   Transthoracic Echocardiography 08/13/15 Study Conclusions  - Left ventricle: The cavity size was normal. There was mild   concentric hypertrophy. Systolic function was mildly reduced. The   estimated ejection fraction was in the range of 45% to 50%.   Akinesis of the basal-mid inferior and inferoseptal myocardium,   hypokinesis of the inferolateral wall and the apical inferior and   apical septal walls. Doppler parameters are consistent with   abnormal left ventricular relaxation (grade 1 diastolic   dysfunction). - Aortic  valve: Transvalvular velocity was within the normal range.   There was no stenosis. There was no regurgitation. - Mitral valve: There was trivial regurgitation. - Right ventricle: The cavity size was normal. Wall thickness was   normal. Systolic function was normal. - Atrial septum: No defect or patent foramen ovale was  identified   by color flow Doppler. - Tricuspid valve: There was no regurgitation.   _____________   History of Present Illness   [name] is a 70 year old male with a past medical history of HTN, HLD, borderline DM, and obesity. He was in his usual state of health until Saturday morning, 08/10/15 when he started having what he felt was heartburn-type discomfort in his chest. The episodes occurred off and on on Saturday, but on Sunday he was he was symptom-free. He went to his primary care physician's office this morning Monday, July 24 to discuss his "heartburn", EKG performed by PCP shows inferior ST elevations with Q waves in leads III and aVF. Right-sided EKG showed some elevations in V4. Consistent with possible posterior infarct. Patient was chest pain-free, however given the very concerning EKG findings see was transported emergently to [facility]  He was chest pain free upon arrival to the ED, however it was felt that the ST elevation represented an occluded vessel and it was best to proceed with urgent catheterization.   Left heart cath (report above), showed tortuous shepherd's crook bend of RCA. He received 2 overlapping DES stents to his proximal RCA. His distal RCA was treated with balloon angioplasty only. There were several small distal posterolateral branches, one of which appeared to be occluded with distal thrombus. Flow was improving at the end of the case, too small for PTCA. He will be on DAPT with ASA and Brilinta for one year.   His troponin increased to >65, and has some persistent ST elevation, but he was chest pain free post procedure and essentially the entire hospitalization. His right radial site is stable with no hematoma.   He had previously been on a statin drug but stopped it and does not remember why. He did have some elevation in his LFT's in March of this year. We will start high intensity statin post - MI and follow his LFT's  closely outpatient.   His Echo showed reduced EF of 45-50%, he is on ARB and beta blocker. Was previously on valsartan at home, his home dose was decreased to 150mg   as he was hypotensive, can up titrate outpatient if needed. BP stable at discharge at 110/71.   His creatinine has gone from 1.8>>1.5 during this hospitalization. Appears that his baseline is 1.2-1.4.  We will repeat BMP outpatient and follow. Also has elevated WBC's. He is without signs of infection, chest X ray was preformed before discharge and showed right base atelectasis, no edema or consolidation. Borderline cardiomegaly and aortic atherosclerosis. Will need early follow up with his PCP. His A1c is 6.1, stable from 3 months ago. Will need to continue to follow up with PCP for this. He is not currently on any medications for DM.   He was seen today by Dr. Ron Parker and deemed suitable for discharge.  _____________  Discharge Vitals Blood pressure 110/71, pulse 88, temperature 98.3 F (36.8 C), temperature source Oral, resp. rate 18, weight (!) 314 lb 13.1 oz (142.8 kg), SpO2 95 %.  Filed Weights   08/12/15 1400  Weight: (!) 314 lb 13.1 oz (142.8 kg)  Labs & Radiologic Studies     CBC  Recent Labs  08/12/15 1005 08/12/15 1007 08/13/15 0441  WBC 22.6*  --  20.7*  HGB 16.3 17.3* 15.1  HCT 48.0 51.0 45.5  MCV 97.6  --  99.1  PLT 249  --  [id number]   Basic Metabolic Panel  Recent Labs  08/12/15 1005 08/12/15 1007 08/13/15 0441  NA 128* 131* 131*  K 4.5 4.6 4.2  CL 98* 95* 97*  CO2 22  --  25  GLUCOSE 148* 148* 115*  BUN 19 22* 20  CREATININE 1.81* 1.80* 1.52*  CALCIUM 9.0  --  8.6*   Liver Function Tests  Recent Labs  08/12/15 1005  AST 177*  ALT 84*  ALKPHOS 68  BILITOT 1.2  PROT 6.9  ALBUMIN 3.6   Cardiac Enzymes  Recent Labs  08/12/15 1005 08/12/15 1747 08/12/15 2246 08/13/15 0441  CKTOTAL 1,135*  --   --   --   CKMB 139.3*  --   --   --   TROPONINI  --  >65.00* >65.00* 54.84*    Hemoglobin A1C  Recent Labs  08/12/15 1005  HGBA1C 6.1*   Fasting Lipid Panel  Recent Labs  08/12/15 1005  CHOL 163  HDL 41  LDLCALC 98  TRIG 122  CHOLHDL 4.0    Dg Chest 2 View  Result Date: 08/14/2015 CLINICAL DATA:  Recent myocardial infarct. EXAM: CHEST  2 VIEW COMPARISON:  None. FINDINGS: There is atelectatic change in the posterior right base. Lungs elsewhere clear. Heart is borderline enlarged with pulmonary vascular within normal limits. There is atherosclerotic calcification in the aortic arch. There is degenerative change in the thoracic spine. IMPRESSION: Right base atelectasis. No edema or consolidation. Borderline cardiomegaly. Aortic atherosclerosis. Electronically Signed   By: Lowella Grip III M.D.   On: 08/14/2015 07:42   Disposition   Pt is being discharged home today in good condition.  Follow-up Plans & Appointments    Follow-up Information    Murray Hodgkins, NP Follow up on 08/22/2015.   Specialties:  Nurse Practitioner, Cardiology, Radiology Why:  at 9:30am for hospital follow up.  Contact information: [address] [place] [address] [phone]          Discharge Instructions    Diet - low sodium heart healthy    Complete by:  As directed   Discharge instructions    Complete by:  As directed   NO HEAVY LIFTING OR SEXUAL ACTIVITY for 7 DAYS. NO DRIVING for 3-5 DAYS. NO SOAKING BATHS, HOT TUBS, POOLS, ETC., for 7 DAYS  Radial Site Care Refer to this sheet in the next few weeks. These instructions provide you with information on caring for yourself after your procedure. Your caregiver may also give you more specific instructions. Your treatment has been planned according to current medical practices, but problems sometimes occur. Call your caregiver if you have any problems or questions after your procedure. HOME CARE INSTRUCTIONS You may shower the day after the procedure.Remove the bandage (dressing) and gently  wash the site with plain soap and water.Gently pat the site dry.  Do not apply powder or lotion to the site.  Do not submerge the affected site in water for 3 to 5 days.  Inspect the site at least twice daily.  Do not flex or bend the affected arm for 24 hours.  No lifting over 5 pounds (2.3 kg) for 5 days after your procedure.  Do not drive home if  you are discharged the same day of the procedure. Have someone else drive you.  You may drive 24 hours after the procedure unless otherwise instructed by your caregiver.  What to expect: Any bruising will usually fade within 1 to 2 weeks.  Blood that collects in the tissue (hematoma) may be painful to the touch. It should usually decrease in size and tenderness within 1 to 2 weeks.  SEEK IMMEDIATE MEDICAL CARE IF: You have unusual pain at the radial site.  You have redness, warmth, swelling, or pain at the radial site.  You have drainage (other than a small amount of blood on the dressing).  You have chills.  You have a fever or persistent symptoms for more than 72 hours.  You have a fever and your symptoms suddenly get worse.  Your arm becomes pale, cool, tingly, or numb.  You have heavy bleeding from the site. Hold pressure on the site.   Increase activity slowly    Complete by:  As directed      Discharge Medications   Current Discharge Medication List    START taking these medications   Details  aspirin EC 81 MG tablet Take 1 tablet (81 mg total) by mouth daily.    atorvastatin (LIPITOR) 80 MG tablet Take 1 tablet (80 mg total) by mouth daily at 6 PM. Qty: 30 tablet, Refills: 12    irbesartan (AVAPRO) 150 MG tablet Take 1 tablet (150 mg total) by mouth daily. Qty: 30 tablet, Refills: 12    nitroGLYCERIN (NITROSTAT) 0.4 MG SL tablet Place 1 tablet (0.4 mg total) under the tongue every 5 (five) minutes as needed for chest pain. Qty: 25 tablet, Refills: 2    !! ticagrelor (BRILINTA) 90 MG TABS tablet Take 1 tablet (90 mg total)  by mouth 2 (two) times daily. Qty: 60 tablet, Refills: 12    !! ticagrelor (BRILINTA) 90 MG TABS tablet Take 1 tablet (90 mg total) by mouth 2 (two) times daily. Qty: 60 tablet, Refills: 0     !! - Potential duplicate medications found. Please discuss with provider.    CONTINUE these medications which have NOT CHANGED   Details  amLODipine (NORVASC) 5 MG tablet TAKE 1 TABLET BY MOUTH DAILY Qty: 90 tablet, Refills: 0    cholecalciferol (VITAMIN D) 1000 units tablet Take 1,000 Units by mouth daily.    metoprolol succinate (TOPROL-XL) 25 MG 24 hr tablet Take 1 tablet (25 mg total) by mouth daily. Qty: 90 tablet, Refills: 1    Multiple Vitamin (MULTIVITAMIN) tablet Take 1 tablet by mouth daily.    Omega-3 Fatty Acids (FISH OIL) 1200 MG CPDR Take 1 tablet by mouth daily.    venlafaxine (EFFEXOR) 75 MG tablet TAKE 1 TABLET BY MOUTH EVERY DAY Qty: 90 tablet, Refills: 0    tadalafil (CIALIS) 20 MG tablet Take 20 mg by mouth daily as needed for erectile dysfunction.      STOP taking these medications     aspirin 81 MG tablet      valsartan (DIOVAN) 320 MG tablet          Aspirin prescribed at discharge?  Yes High Intensity Statin Prescribed? Yes Beta Blocker Prescribed? Yes For EF 40% or less, Was ACEI/ARB Prescribed? Yes but EF 45-50% ADP Receptor Inhibitor Prescribed?Yes For EF <40%, Aldosterone Inhibitor Prescribed? No, EF 45-50% Was EF assessed during THIS hospitalization? Yes Was Cardiac Rehab II ordered? (Included Medically managed Patients): Yes   Outstanding Labs/Studies  BMP, CBC, hepatic function.  Duration of Discharge Encounter   Greater than 30 minutes including physician time.  Signed, Arbutus Leas NP 08/14/2015, 8:27 AM  Patient seen and examined. I agree with the assessment and plan as detailed above. See also my additional thoughts below.   See my progress note. I made decision for discharge. I agree with plans as outlined.  Dola Argyle, MD,  [facility] 08/14/2015 9:00 AM

## 2015-08-16 ENCOUNTER — Other Ambulatory Visit: Payer: Self-pay

## 2015-08-16 MED ORDER — METOPROLOL SUCCINATE ER 25 MG PO TB24: 25.0000 mg | ORAL_TABLET | Freq: Every day | ORAL | 1 refills | Status: DC

## 2015-08-16 NOTE — Telephone Encounter (Signed)
Patient contacted regarding discharge from [facility] on 08/14/15.  Patient understands to follow up with provider Angelica Ran, NP on 08/22/2015 at 0915/0930 at [place]. Patient understands discharge instructions? YES Patient understands medications and regiment? YES - clarified that he should continue to take amlodipine & metoprolol per discharge summary Patient understands to bring all medications to this visit? YES

## 2015-08-20 DIAGNOSIS — I4901 Ventricular fibrillation: Secondary | ICD-10-CM

## 2015-08-20 HISTORY — DX: Ventricular fibrillation: I49.01

## 2015-08-22 ENCOUNTER — Encounter (HOSPITAL_COMMUNITY): Payer: Self-pay | Admitting: *Deleted

## 2015-08-22 ENCOUNTER — Inpatient Hospital Stay (HOSPITAL_COMMUNITY)
Admission: EM | Admit: 2015-08-22 | Discharge: 2015-08-27 | DRG: 245 | Disposition: A | Discharge status: Home / Self Care | Payer: Medicare Other | Attending: Cardiology | Admitting: Cardiology

## 2015-08-22 ENCOUNTER — Emergency Department (HOSPITAL_COMMUNITY): Payer: Medicare Other

## 2015-08-22 ENCOUNTER — Encounter: Payer: Self-pay | Admitting: Nurse Practitioner

## 2015-08-22 ENCOUNTER — Ambulatory Visit (INDEPENDENT_AMBULATORY_CARE_PROVIDER_SITE_OTHER): Payer: Medicare Other | Admitting: Pharmacist Clinician (PhC)/ Clinical Pharmacy Specialist

## 2015-08-22 ENCOUNTER — Ambulatory Visit (INDEPENDENT_AMBULATORY_CARE_PROVIDER_SITE_OTHER): Payer: Medicare Other | Admitting: Nurse Practitioner

## 2015-08-22 DIAGNOSIS — I5042 Chronic combined systolic (congestive) and diastolic (congestive) heart failure: Secondary | ICD-10-CM | POA: Diagnosis present

## 2015-08-22 DIAGNOSIS — E785 Hyperlipidemia, unspecified: Secondary | ICD-10-CM | POA: Insufficient documentation

## 2015-08-22 DIAGNOSIS — Z955 Presence of coronary angioplasty implant and graft: Secondary | ICD-10-CM

## 2015-08-22 DIAGNOSIS — I2510 Atherosclerotic heart disease of native coronary artery without angina pectoris: Secondary | ICD-10-CM | POA: Diagnosis present

## 2015-08-22 DIAGNOSIS — I130 Hypertensive heart and chronic kidney disease with heart failure and stage 1 through stage 4 chronic kidney disease, or unspecified chronic kidney disease: Secondary | ICD-10-CM | POA: Diagnosis not present

## 2015-08-22 DIAGNOSIS — Z8249 Family history of ischemic heart disease and other diseases of the circulatory system: Secondary | ICD-10-CM | POA: Diagnosis not present

## 2015-08-22 DIAGNOSIS — I119 Hypertensive heart disease without heart failure: Secondary | ICD-10-CM | POA: Insufficient documentation

## 2015-08-22 DIAGNOSIS — I5043 Acute on chronic combined systolic (congestive) and diastolic (congestive) heart failure: Secondary | ICD-10-CM | POA: Diagnosis present

## 2015-08-22 DIAGNOSIS — I25119 Atherosclerotic heart disease of native coronary artery with unspecified angina pectoris: Secondary | ICD-10-CM | POA: Diagnosis present

## 2015-08-22 DIAGNOSIS — Z79899 Other long term (current) drug therapy: Secondary | ICD-10-CM | POA: Diagnosis not present

## 2015-08-22 DIAGNOSIS — Z7902 Long term (current) use of antithrombotics/antiplatelets: Secondary | ICD-10-CM

## 2015-08-22 DIAGNOSIS — I484 Atypical atrial flutter: Secondary | ICD-10-CM | POA: Diagnosis present

## 2015-08-22 DIAGNOSIS — Z888 Allergy status to other drugs, medicaments and biological substances status: Secondary | ICD-10-CM

## 2015-08-22 DIAGNOSIS — Z7982 Long term (current) use of aspirin: Secondary | ICD-10-CM | POA: Diagnosis not present

## 2015-08-22 DIAGNOSIS — Z6839 Body mass index (BMI) 39.0-39.9, adult: Secondary | ICD-10-CM | POA: Diagnosis not present

## 2015-08-22 DIAGNOSIS — G4733 Obstructive sleep apnea (adult) (pediatric): Secondary | ICD-10-CM | POA: Diagnosis not present

## 2015-08-22 DIAGNOSIS — F419 Anxiety disorder, unspecified: Secondary | ICD-10-CM | POA: Diagnosis present

## 2015-08-22 DIAGNOSIS — I4510 Unspecified right bundle-branch block: Secondary | ICD-10-CM | POA: Diagnosis not present

## 2015-08-22 DIAGNOSIS — N183 Chronic kidney disease, stage 3 (moderate): Secondary | ICD-10-CM | POA: Diagnosis present

## 2015-08-22 DIAGNOSIS — I252 Old myocardial infarction: Secondary | ICD-10-CM | POA: Diagnosis not present

## 2015-08-22 DIAGNOSIS — I5022 Chronic systolic (congestive) heart failure: Secondary | ICD-10-CM | POA: Diagnosis not present

## 2015-08-22 DIAGNOSIS — I420 Dilated cardiomyopathy: Secondary | ICD-10-CM | POA: Insufficient documentation

## 2015-08-22 DIAGNOSIS — E669 Obesity, unspecified: Secondary | ICD-10-CM | POA: Diagnosis present

## 2015-08-22 DIAGNOSIS — I4892 Unspecified atrial flutter: Secondary | ICD-10-CM | POA: Diagnosis not present

## 2015-08-22 DIAGNOSIS — R7989 Other specified abnormal findings of blood chemistry: Secondary | ICD-10-CM | POA: Diagnosis not present

## 2015-08-22 DIAGNOSIS — I110 Hypertensive heart disease with heart failure: Secondary | ICD-10-CM

## 2015-08-22 DIAGNOSIS — I2119 ST elevation (STEMI) myocardial infarction involving other coronary artery of inferior wall: Secondary | ICD-10-CM | POA: Diagnosis not present

## 2015-08-22 DIAGNOSIS — I2129 ST elevation (STEMI) myocardial infarction involving other sites: Secondary | ICD-10-CM | POA: Diagnosis present

## 2015-08-22 DIAGNOSIS — R778 Other specified abnormalities of plasma proteins: Secondary | ICD-10-CM

## 2015-08-22 DIAGNOSIS — I471 Supraventricular tachycardia: Secondary | ICD-10-CM

## 2015-08-22 DIAGNOSIS — I255 Ischemic cardiomyopathy: Secondary | ICD-10-CM | POA: Diagnosis not present

## 2015-08-22 DIAGNOSIS — Z95810 Presence of automatic (implantable) cardiac defibrillator: Secondary | ICD-10-CM | POA: Diagnosis not present

## 2015-08-22 DIAGNOSIS — I472 Ventricular tachycardia: Secondary | ICD-10-CM | POA: Diagnosis not present

## 2015-08-22 DIAGNOSIS — Z881 Allergy status to other antibiotic agents status: Secondary | ICD-10-CM

## 2015-08-22 DIAGNOSIS — Z9861 Coronary angioplasty status: Secondary | ICD-10-CM

## 2015-08-22 DIAGNOSIS — I10 Essential (primary) hypertension: Secondary | ICD-10-CM | POA: Insufficient documentation

## 2015-08-22 DIAGNOSIS — R000 Tachycardia, unspecified: Secondary | ICD-10-CM | POA: Diagnosis not present

## 2015-08-22 LAB — I-STAT TROPONIN, ED: Troponin i, poc: 1.83 ng/mL (ref 0.00–0.08)

## 2015-08-22 LAB — COMPREHENSIVE METABOLIC PANEL
ALBUMIN: 3.4 g/dL — AB (ref 3.5–5.0)
ALK PHOS: 93 U/L (ref 38–126)
ALT: 56 U/L (ref 17–63)
ANION GAP: 11 (ref 5–15)
AST: 46 U/L — ABNORMAL HIGH (ref 15–41)
BILIRUBIN TOTAL: 0.5 mg/dL (ref 0.3–1.2)
BUN: 30 mg/dL — ABNORMAL HIGH (ref 6–20)
CALCIUM: 9.2 mg/dL (ref 8.9–10.3)
CO2: 18 mmol/L — AB (ref 22–32)
Chloride: 105 mmol/L (ref 101–111)
Creatinine, Ser: 1.43 mg/dL — ABNORMAL HIGH (ref 0.61–1.24)
GFR calc non Af Amer: 47 mL/min — ABNORMAL LOW (ref >60)
GFR, EST AFRICAN AMERICAN: 55 mL/min — AB (ref >60)
GLUCOSE: 152 mg/dL — AB (ref 65–99)
POTASSIUM: 4.5 mmol/L (ref 3.5–5.1)
SODIUM: 134 mmol/L — AB (ref 135–145)
TOTAL PROTEIN: 6.7 g/dL (ref 6.5–8.1)

## 2015-08-22 LAB — CBC WITH DIFFERENTIAL/PLATELET
BASOS ABS: 0 10*3/uL (ref 0.0–0.1)
BASOS PCT: 0 %
Eosinophils Absolute: 0.1 10*3/uL (ref 0.0–0.7)
Eosinophils Relative: 1 %
HEMATOCRIT: 47.8 % (ref 39.0–52.0)
HEMOGLOBIN: 16 g/dL (ref 13.0–17.0)
LYMPHS PCT: 17 %
Lymphs Abs: 2.4 10*3/uL (ref 0.7–4.0)
MCH: 33.1 pg (ref 26.0–34.0)
MCHC: 33.5 g/dL (ref 30.0–36.0)
MCV: 98.8 fL (ref 78.0–100.0)
MONO ABS: 1.1 10*3/uL — AB (ref 0.1–1.0)
Monocytes Relative: 8 %
NEUTROS ABS: 10.6 10*3/uL — AB (ref 1.7–7.7)
NEUTROS PCT: 74 %
Platelets: 423 10*3/uL — ABNORMAL HIGH (ref 150–400)
RBC: 4.84 MIL/uL (ref 4.22–5.81)
RDW: 13.7 % (ref 11.5–15.5)
WBC: 14.3 10*3/uL — ABNORMAL HIGH (ref 4.0–10.5)

## 2015-08-22 LAB — MAGNESIUM: MAGNESIUM: 1.8 mg/dL (ref 1.7–2.4)

## 2015-08-22 LAB — TROPONIN I: Troponin I: 1.65 ng/mL (ref <0.03)

## 2015-08-22 LAB — BRAIN NATRIURETIC PEPTIDE: B NATRIURETIC PEPTIDE 5: 641.1 pg/mL — AB (ref 0.0–100.0)

## 2015-08-22 LAB — PROTIME-INR
INR: 1.16
PROTHROMBIN TIME: 14.9 s (ref 11.4–15.2)

## 2015-08-22 MED ORDER — AMIODARONE HCL IN DEXTROSE 360-4.14 MG/200ML-% IV SOLN
60.0000 mg/h | INTRAVENOUS | Status: AC
  Administered 2015-08-22: 60 mg/h via INTRAVENOUS
  Filled 2015-08-22 (×2): qty 200

## 2015-08-22 MED ORDER — DILTIAZEM LOAD VIA INFUSION
10.0000 mg | Freq: Once | INTRAVENOUS | Status: AC
  Administered 2015-08-22: 15 mg via INTRAVENOUS
  Filled 2015-08-22: qty 10

## 2015-08-22 MED ORDER — AMIODARONE LOAD VIA INFUSION
150.0000 mg | Freq: Once | INTRAVENOUS | Status: AC
  Administered 2015-08-22: 150 mg via INTRAVENOUS
  Filled 2015-08-22: qty 83.34

## 2015-08-22 MED ORDER — ADULT MULTIVITAMIN W/MINERALS CH
1.0000 | ORAL_TABLET | Freq: Every day | ORAL | Status: DC
  Administered 2015-08-23 – 2015-08-27 (×4): 1 via ORAL
  Filled 2015-08-22 (×4): qty 1

## 2015-08-22 MED ORDER — ACETAMINOPHEN 325 MG PO TABS: 650.0000 mg | ORAL_TABLET | ORAL | Status: DC | PRN

## 2015-08-22 MED ORDER — DILTIAZEM LOAD VIA INFUSION: 15.0000 mg | Freq: Once | INTRAVENOUS | Status: DC

## 2015-08-22 MED ORDER — WARFARIN SODIUM 5 MG PO TABS
5.0000 mg | ORAL_TABLET | Freq: Once | ORAL | Status: AC
  Administered 2015-08-22: 5 mg via ORAL
  Filled 2015-08-22: qty 1

## 2015-08-22 MED ORDER — TICAGRELOR 90 MG PO TABS
90.0000 mg | ORAL_TABLET | Freq: Two times a day (BID) | ORAL | Status: DC
  Administered 2015-08-22 – 2015-08-27 (×10): 90 mg via ORAL
  Filled 2015-08-22 (×11): qty 1

## 2015-08-22 MED ORDER — OMEGA-3-ACID ETHYL ESTERS 1 G PO CAPS
1.0000 g | ORAL_CAPSULE | Freq: Every day | ORAL | Status: DC
  Administered 2015-08-23 – 2015-08-27 (×5): 1 g via ORAL
  Filled 2015-08-22 (×5): qty 1

## 2015-08-22 MED ORDER — VITAMIN D 1000 UNITS PO TABS
2000.0000 [IU] | ORAL_TABLET | Freq: Every day | ORAL | Status: DC
  Administered 2015-08-23 – 2015-08-27 (×5): 2000 [IU] via ORAL
  Filled 2015-08-22 (×5): qty 2

## 2015-08-22 MED ORDER — IRBESARTAN 75 MG PO TABS
150.0000 mg | ORAL_TABLET | Freq: Every day | ORAL | Status: DC
Start: 2015-08-23 — End: 2015-08-27
  Administered 2015-08-24 – 2015-08-27 (×4): 150 mg via ORAL
  Filled 2015-08-22 (×5): qty 1
  Filled 2015-08-22: qty 2

## 2015-08-22 MED ORDER — ENOXAPARIN SODIUM 150 MG/ML ~~LOC~~ SOLN
1.0000 mg/kg | Freq: Two times a day (BID) | SUBCUTANEOUS | Status: DC
  Administered 2015-08-22: 140 mg via SUBCUTANEOUS
  Filled 2015-08-22 (×3): qty 0.92

## 2015-08-22 MED ORDER — DILTIAZEM HCL 100 MG IV SOLR
5.0000 mg/h | INTRAVENOUS | Status: DC
  Administered 2015-08-22: 10 mg/h via INTRAVENOUS
  Administered 2015-08-22: 5 mg/h via INTRAVENOUS
  Administered 2015-08-23 (×2): 15 mg/h via INTRAVENOUS
  Filled 2015-08-22 (×4): qty 100

## 2015-08-22 MED ORDER — ASPIRIN EC 81 MG PO TBEC
81.0000 mg | DELAYED_RELEASE_TABLET | Freq: Every day | ORAL | Status: DC
  Administered 2015-08-23 – 2015-08-27 (×4): 81 mg via ORAL
  Filled 2015-08-22 (×5): qty 1

## 2015-08-22 MED ORDER — SODIUM CHLORIDE 0.9% FLUSH
3.0000 mL | Freq: Two times a day (BID) | INTRAVENOUS | Status: DC
  Administered 2015-08-22 – 2015-08-25 (×7): 3 mL via INTRAVENOUS

## 2015-08-22 MED ORDER — ATORVASTATIN CALCIUM 80 MG PO TABS
80.0000 mg | ORAL_TABLET | Freq: Every day | ORAL | Status: DC
  Administered 2015-08-22 – 2015-08-26 (×5): 80 mg via ORAL
  Filled 2015-08-22 (×5): qty 1

## 2015-08-22 MED ORDER — ONDANSETRON HCL 4 MG/2ML IJ SOLN: 4.0000 mg | Freq: Four times a day (QID) | INTRAMUSCULAR | Status: DC | PRN

## 2015-08-22 MED ORDER — VENLAFAXINE HCL 75 MG PO TABS
75.0000 mg | ORAL_TABLET | Freq: Every day | ORAL | Status: DC
  Administered 2015-08-24 – 2015-08-26 (×3): 75 mg via ORAL
  Filled 2015-08-22 (×5): qty 1

## 2015-08-22 MED ORDER — METOPROLOL SUCCINATE ER 25 MG PO TB24
25.0000 mg | ORAL_TABLET | Freq: Every day | ORAL | Status: DC
  Administered 2015-08-23 – 2015-08-27 (×5): 25 mg via ORAL
  Filled 2015-08-22 (×6): qty 1

## 2015-08-22 MED ORDER — SODIUM CHLORIDE 0.9 % IV BOLUS (SEPSIS)
500.0000 mL | Freq: Once | INTRAVENOUS | Status: AC
  Administered 2015-08-22: 500 mL via INTRAVENOUS

## 2015-08-22 MED ORDER — SODIUM CHLORIDE 0.9 % IV SOLN: 250.0000 mL | INTRAVENOUS | Status: DC | PRN

## 2015-08-22 MED ORDER — AMIODARONE IV BOLUS ONLY 150 MG/100ML
150.0000 mg | Freq: Once | INTRAVENOUS | Status: AC
  Administered 2015-08-22: 150 mg via INTRAVENOUS

## 2015-08-22 MED ORDER — AMIODARONE HCL IN DEXTROSE 360-4.14 MG/200ML-% IV SOLN
30.0000 mg/h | INTRAVENOUS | Status: DC
  Administered 2015-08-23: 30 mg/h via INTRAVENOUS
  Filled 2015-08-22 (×2): qty 200

## 2015-08-22 MED ORDER — SODIUM CHLORIDE 0.9% FLUSH: 3.0000 mL | INTRAVENOUS | Status: DC | PRN

## 2015-08-22 MED ORDER — WARFARIN - PHARMACIST DOSING INPATIENT: Freq: Every day | Status: DC

## 2015-08-22 MED ORDER — FISH OIL 1200 MG PO CPDR: 1.0000 | DELAYED_RELEASE_CAPSULE | Freq: Every day | ORAL | Status: DC

## 2015-08-22 MED ORDER — NITROGLYCERIN 0.4 MG SL SUBL: 0.4000 mg | SUBLINGUAL_TABLET | SUBLINGUAL | Status: DC | PRN

## 2015-08-22 NOTE — ED Provider Notes (Signed)
[facility] DEPT Provider Note   CSN: [id number] Arrival date & time: 08/22/15  1058  First Provider Contact:  None       History   Chief Complaint Chief Complaint  Patient presents with  . Tachycardia    HPI Leonard [name] is a 70 y.o. male.  HPI 70 year old male with extensive past medical history including recent posterior STEMI status post drug-eluting stent, who presents with rapid heart rate. The patient was seen in cardiology clinic earlier today, at which time he endorsed mild palpitations. EKG was obtained and he was noted to be in atrial fibrillation versus flutter. He was subsequently transferred here for evaluation. Currently, the patient endorses no complaints. He states he has felt fatigued since his STEMI, but denies any acute changes. He denies any associated chest pain, shortness of breath, nausea, vomiting, or diaphoresis. He is not on any blood thinners.  Past Medical History:  Diagnosis Date  . Anxiety   . Atrial flutter (HCC)    a. CHA2DS2VASc = 4.  [name] CAD (coronary artery disease)    a. 07/2015 Posterior STEMI/PCI: LM nl, LAd 40ost, RI 40, RCA 180m (3.0x22 Resolute Integrity DES, 3.0x12 Resolute Integrity DES), 90d (PTCA), RPDA small, nl, RPLB2 100 - too small for PTCA.  [name] Chronic combined systolic and diastolic CHF (congestive heart failure) ([place])    a. 07/2015 Ehco: EF 45-50%, basal-mid inf and infsept AK, inflat, apical inf, and apical septal HK, Gr1 DD, triv MR.  [name] Hyperlipidemia   . Hypertensive heart disease   . Ischemic cardiomyopathy    a. 07/2015 Echo: EF 45-50%.  . Morbid obesity [facility])     Patient Active Problem List   Diagnosis Date Noted  . Atrial flutter ([place]) 08/22/2015  . Hypertension   . Hypertensive heart disease   . Ischemic cardiomyopathy   . Morbid obesity ([place])   . Hyperlipidemia   . Chronic combined systolic and diastolic CHF (congestive heart failure) ([place])   . CAD (coronary artery disease)   . Elevated LFTs 08/14/2015    . Chronic kidney disease (CKD), stage [name] (moderate) 08/14/2015  . Elevated WBC count 08/14/2015  . Elevated glucose 08/14/2015  . ST elevation myocardial infarction (STEMI) of true posterior wall, initial episode of care ([place]) 08/12/2015  . Essential hypertension 02/12/2015  . Hyperlipidemia LDL goal <70 02/12/2015  . Erectile dysfunction 02/12/2015  . BPH (benign prostatic hyperplasia) 02/12/2015  . Elevated hemoglobin A1c 02/12/2015    Past Surgical History:  Procedure Laterality Date  . CARDIAC CATHETERIZATION N/A 08/12/2015   Procedure: Left Heart Cath and Coronary Angiography;  Surgeon: Leonie Man, MD;  Location: [place] [facility];  Service: Cardiovascular;  Laterality: N/A;  . CORONARY ANGIOPLASTY    . HEMORRHOID SURGERY    . SKIN SURGERY         Home Medications    Prior to Admission medications   Medication Sig Start Date End Date Taking? Authorizing Provider  amLODipine (NORVASC) 5 MG tablet TAKE 1 TABLET BY MOUTH DAILY 08/07/15  Yes Girtha Rm, NP  aspirin EC 81 MG tablet Take 1 tablet (81 mg total) by mouth daily. 08/14/15  Yes Arbutus Leas, NP  atorvastatin (LIPITOR) 80 MG tablet Take 1 tablet (80 mg total) by mouth daily at 6 PM. 08/14/15  Yes Arbutus Leas, NP  cholecalciferol (VITAMIN D) 1000 units tablet Take 2,000 Units by mouth daily.    Yes Historical Provider, MD  Coenzyme Q10 (COQ10) 200 MG CAPS Take  200 mg by mouth daily.   Yes Historical Provider, MD  irbesartan (AVAPRO) 150 MG tablet Take 1 tablet (150 mg total) by mouth daily. 08/14/15  Yes Arbutus Leas, NP  ketotifen (ZADITOR) 0.025 % ophthalmic solution Place 1 drop into both eyes as needed (for dry eyes).   Yes Historical Provider, MD  metoprolol succinate (TOPROL-XL) 25 MG 24 hr tablet Take 1 tablet (25 mg total) by mouth daily. 08/16/15  Yes Lorretta Harp, MD  Multiple Vitamin (MULTIVITAMIN) tablet Take 1 tablet by mouth daily.   Yes Historical Provider, MD  Omega-3 Fatty Acids (FISH OIL)  1200 MG CPDR Take 1 tablet by mouth daily.   Yes Historical Provider, MD  ticagrelor (BRILINTA) 90 MG TABS tablet Take 1 tablet (90 mg total) by mouth 2 (two) times daily. 08/14/15  Yes Arbutus Leas, NP  venlafaxine (EFFEXOR) 75 MG tablet TAKE 1 TABLET BY MOUTH EVERY DAY 06/24/15  Yes Girtha Rm, NP  nitroGLYCERIN (NITROSTAT) 0.4 MG SL tablet Place 1 tablet (0.4 mg total) under the tongue every 5 (five) minutes as needed for chest pain. 08/14/15   Arbutus Leas, NP  tadalafil (CIALIS) 20 MG tablet Take 20 mg by mouth daily as needed for erectile dysfunction.    Historical Provider, MD    Family History Family History  Problem Relation Age of Onset  . Diabetes Mother   . Heart disease Paternal Grandmother   . Heart disease Paternal Grandfather     Social History Social History  Substance Use Topics  . Smoking status: Never Smoker  . Smokeless tobacco: Never Used  . Alcohol use 1.8 - 2.4 oz/week    3 - 4 Shots of liquor per week     Comment: 3-4 glasses of scotch 3-4 days per week     Allergies   Other and Sulfa antibiotics   Review of Systems Review of Systems  Constitutional: Positive for fatigue. Negative for chills and fever.  HENT: Negative for congestion and rhinorrhea.   Eyes: Negative for visual disturbance.  Respiratory: Negative for cough, shortness of breath and wheezing.   Cardiovascular: Positive for palpitations. Negative for chest pain and leg swelling.  Gastrointestinal: Negative for abdominal pain, diarrhea, nausea and vomiting.  Genitourinary: Negative for dysuria and flank pain.  Musculoskeletal: Negative for neck pain and neck stiffness.  Skin: Negative for rash and wound.  Allergic/Immunologic: Negative for immunocompromised state.  Neurological: Negative for syncope, weakness and headaches.  All other systems reviewed and are negative.    Physical Exam Updated Vital Signs BP (!) 130/119   Pulse (!) 159   Resp 17   Ht 6\' 2"  (1.88 m)   Wt (!) 303  lb (137.4 kg)   SpO2 97%   BMI 38.90 kg/m   Physical Exam  Constitutional: He is oriented to person, place, and time. He appears well-developed and well-nourished. No distress.  Nontoxic, in no acute distress  HENT:  Head: Normocephalic and atraumatic.  Mouth/Throat: Oropharynx is clear and moist.  Eyes: Conjunctivae are normal. Pupils are equal, round, and reactive to light.  Neck: Neck supple.  Cardiovascular: Normal heart sounds.  An irregular rhythm present. Tachycardia present.  Exam reveals no friction rub.   No murmur heard. Pulmonary/Chest: Effort normal and breath sounds normal. No respiratory distress. He has no wheezes. He has no rales.  Abdominal: He exhibits no distension. There is no tenderness.  Musculoskeletal: He exhibits no edema.  Neurological: He is alert and oriented to person,  place, and time. He exhibits normal muscle tone.  Skin: Skin is warm. Capillary refill takes less than 2 seconds.  Nursing note and vitals reviewed.    ED Treatments / Results  Labs (all labs ordered are listed, but only abnormal results are displayed) Labs Reviewed  COMPREHENSIVE METABOLIC PANEL - Abnormal; Notable for the following:       Result Value   Sodium 134 (*)    CO2 18 (*)    Glucose, Bld 152 (*)    BUN 30 (*)    Creatinine, Ser 1.43 (*)    Albumin 3.4 (*)    AST 46 (*)    GFR calc non Af Amer 47 (*)    GFR calc Af Amer 55 (*)    All other components within normal limits  CBC WITH DIFFERENTIAL/PLATELET - Abnormal; Notable for the following:    WBC 14.3 (*)    Platelets 423 (*)    Neutro Abs 10.6 (*)    Monocytes Absolute 1.1 (*)    All other components within normal limits  BRAIN NATRIURETIC PEPTIDE - Abnormal; Notable for the following:    B Natriuretic Peptide 641.1 (*)    All other components within normal limits  I-STAT TROPOININ, ED - Abnormal; Notable for the following:    Troponin i, poc 1.83 (*)    All other components within normal limits  MAGNESIUM    PROTIME-INR  PROTIME-INR  TROPONIN I  TROPONIN I  TROPONIN I  BASIC METABOLIC PANEL  CBC    EKG  EKG Interpretation  Date/Time:  Thursday August 22 2015 19:55:02 EDT Ventricular Rate:  158 PR Interval:    QRS Duration: 118 QT Interval:  306 QTC Calculation: 497 R Axis:   -89 Text Interpretation:  Ventricular-paced complexes No further rhythm analysis attempted due to paced rhythm Probable left atrial enlargement Right bundle branch block Artifact in lead(s) I [name] aVR aVL aVF V3 no sig cahnge from previous Confirmed by Johnney Killian, MD, Jeannie Done [phone]) on 08/22/2015 7:58:11 PM       Radiology Dg Chest Portable 1 View  Result Date: 08/22/2015 CLINICAL DATA:  Tachycardia with irregular heartbeat. EXAM: PORTABLE CHEST 1 VIEW COMPARISON:  08/14/2015. FINDINGS: Cardiomegaly. No active infiltrates or failure. RIGHT base atelectasis is improved. No effusion or pneumothorax. IMPRESSION: Cardiomegaly.  No active disease.  Improved aeration. Electronically Signed   By: Staci Righter M.D.   On: 08/22/2015 13:31    Procedures .Critical Care Performed by: Duffy Bruce Authorized by: Duffy Bruce   Critical care provider statement:    Critical care time (minutes):  45   Critical care time was exclusive of:  Separately billable procedures and treating other patients   Critical care was necessary to treat or prevent imminent or life-threatening deterioration of the following conditions:  Cardiac failure, circulatory failure and respiratory failure   Critical care was time spent personally by me on the following activities:  Development of treatment plan with patient or surrogate, discussions with consultants, evaluation of patient's response to treatment, examination of patient, ordering and performing treatments and interventions, ordering and review of laboratory studies, ordering and review of radiographic studies, pulse oximetry, re-evaluation of patient's condition and review of old  charts   I assumed direction of critical care for this patient from another provider in my specialty: no      (including critical care time)  Medications Ordered in ED Medications  diltiazem (CARDIZEM) 100 mg in dextrose 5 % 100 mL (1 mg/mL) infusion (5 mg/hr  Intravenous Transfusing/Transfer 08/22/15 2007)  enoxaparin (LOVENOX) injection 140 mg (140 mg Subcutaneous Given 08/22/15 1340)  metoprolol succinate (TOPROL-XL) 24 hr tablet 25 mg (not administered)  aspirin EC tablet 81 mg (not administered)  atorvastatin (LIPITOR) tablet 80 mg (80 mg Oral Given 08/22/15 1804)  irbesartan (AVAPRO) tablet 150 mg (not administered)  nitroGLYCERIN (NITROSTAT) SL tablet 0.4 mg (not administered)  ticagrelor (BRILINTA) tablet 90 mg (not administered)  venlafaxine (EFFEXOR) tablet 75 mg (not administered)  cholecalciferol (VITAMIN D) tablet 2,000 Units (not administered)  multivitamin with minerals tablet 1 tablet (not administered)  acetaminophen (TYLENOL) tablet 650 mg (not administered)  ondansetron (ZOFRAN) injection 4 mg (not administered)  sodium chloride flush (NS) 0.9 % injection 3 mL (not administered)  sodium chloride flush (NS) 0.9 % injection 3 mL (not administered)  0.9 %  sodium chloride infusion (not administered)  amiodarone (NEXTERONE) 1.8 mg/mL load via infusion 150 mg (150 mg Intravenous Bolus from Bag 08/22/15 1335)    Followed by  amiodarone (NEXTERONE PREMIX) 360-4.14 MG/200ML-% (1.8 mg/mL) IV infusion (0 mg/hr Intravenous Stopped 08/22/15 2002)    Followed by  amiodarone (NEXTERONE PREMIX) 360-4.14 MG/200ML-% (1.8 mg/mL) IV infusion (not administered)  Warfarin - Pharmacist Dosing Inpatient (not administered)  omega-3 acid ethyl esters (LOVAZA) capsule 1 g (not administered)  diltiazem (CARDIZEM) 1 mg/mL load via infusion 10 mg (15 mg Intravenous Bolus from Bag 08/22/15 1150)  sodium chloride 0.9 % bolus 500 mL (0 mLs Intravenous Stopped 08/22/15 1240)  warfarin (COUMADIN) tablet 5 mg (5  mg Oral Given 08/22/15 1804)  amiodarone (NEXTERONE) IV bolus only 150 mg/100 mL (0 mg Intravenous Stopped 08/22/15 1617)     Initial Impression / Assessment and Plan / ED Course  I have reviewed the triage vital signs and the nursing notes.  Pertinent labs & imaging results that were available during my care of the patient were reviewed by me and considered in my medical decision making (see chart for details).  Clinical Course    70 year old male with extensive past medical history who presents with rapid heart rate. EKG immediately obtained and is concerning for possible A. fib, a flutter, or less likely V. tach. Patient sent from cardiology clinic. Immediately on arrival, placed IV and will start IV diltiazem bolus and drip as per cardiology recommendations. Patient also given Lovenox as he is not anticoagulated. Onset is unknown as patient has no symptoms, so we will attempt to avoid cardioversion. At this time, he is tachycardic but blood pressure is stable and he is without complaints. Will attempt rate control at this time.  Labs and imaging reviewed as above. Troponin elevated, which is not surprising and likely due to demand.. Denies any chest pain currently. The patient remains persistently tachycardic despite diltiazem bolus and drip. Discussed case with cardiology. At this time, will start amiodarone bolus and drip and admit to stepdown.  Patient noted to have transient drop in blood pressure. He was placed on cardiac pads and I discussed with cardiology. However, on repeat, patient is now normotensive and without complaints. I discussed possible cardioversion with cardiology and they would like to give additional amiodarone bolus and continue current management. Given patient's otherwise stable appearance, believe this is reasonable.  Final Clinical Impressions(s) / ED Diagnoses   Final diagnoses:  SVT (supraventricular tachycardia) (HCC)  Elevated troponin  History of ST elevation  myocardial infarction  S/P PTCA (percutaneous transluminal coronary angioplasty)    New Prescriptions New Prescriptions   No medications on file  Duffy Bruce, MD 08/22/15 2017

## 2015-08-22 NOTE — Progress Notes (Addendum)
ANTICOAGULATION CONSULT NOTE - Initial Consult  Pharmacy Consult for Lovenox + warfarin Indication: atrial fibrillation  Allergies  Allergen Reactions  . Other     ragweed  . Sulfa Antibiotics Other (See Comments)    Pt unsure    Patient Measurements:     Vital Signs: BP: 112/62 (08/03 0932) Pulse Rate: 166 (08/03 0932)  Labs: No results for input(s): HGB, HCT, PLT, APTT, LABPROT, INR, HEPARINUNFRC, HEPRLOWMOCWT, CREATININE, CKTOTAL, CKMB, TROPONINI in the last 72 hours.  Estimated Creatinine Clearance: 64.9 mL/min (by C-G formula based on SCr of 1.52 mg/dL).   Assessment: 82 YOM recently discharged on 7/26 after hospitalization for STEMI. Cath procedure was difficult per notes and all areas could not be revascularized. 2 overlapping stents were placed and is to be on DAPT with ASA + Brilinta x1 year.  He went to his follow-up appointment today and was found to be in rapid AFlutter. Plan is to start warfarin with parenteral overlap and to discontinue ASA after 30 days of anticoagulation of warfarin.  Last SCr 1.5 on 7/25 which appears to be around baseline. CrCl ~60-52mL.min. Hgb 15.1, plts 220 at time of discharge.   Goal of Therapy:  Anti-Xa level 0.6-1 units/ml 4hrs after LMWH dose given Monitor platelets by anticoagulation protocol: Yes   Plan:   -Lovenox 140mg  subQ q12h (1mg /kg) -Wafarin 5mg  po x1 tonight  -CBC q72h at minimum, daily INR -Follow any changes to cardiology plans  Anastyn Ayars D. Khrystal Jeanmarie, PharmD, BCPS Clinical Pharmacist Pager: [phone] 08/22/2015 11:23 AM

## 2015-08-22 NOTE — ED Notes (Signed)
MD at bedside. 

## 2015-08-22 NOTE — ED Notes (Signed)
Attempted to call report

## 2015-08-22 NOTE — Assessment & Plan Note (Signed)
Reviewed all medications with patient.  He has no issues with cost of Brilinta.  Understands need to take each of these meds daily.  Referral for cardiac rehab in place.  He is now following heart healthy diet and has lost 11 pounds since day of hospitalization.  Was encouraged to continue with this as well as rehab/exercise.  Is aware to monitor BP regularly as weight decreases and exercise increases.

## 2015-08-22 NOTE — H&P (Signed)
Cardiology Clinic Note   Patient Name: Leonard [name] Date of Encounter: 08/22/2015  Primary Care Provider:  [name], NP Primary Cardiologist:  Roni Bread, MD   Patient Profile    70 year old male status post recent posterior MI who presents to the office today for follow-up and found to be in rapid atrial flutter.  Past Medical History        Past Medical History:  Diagnosis Date  . Anxiety   . CAD (coronary artery disease)    a. 07/2015 Posterior STEMI/PCI: LM nl, LAd 40ost, RI 40, RCA 155m (3.0x22 Resolute Integrity DES, 3.0x12 Resolute Integrity DES), 90d (PTCA), RPDA small, nl, RPLB2 100 - too small for PTCA.  [name] Chronic combined systolic and diastolic CHF (congestive heart failure) ([place])    a. 07/2015 Ehco: EF 45-50%, basal-mid inf and infsept AK, inflat, apical inf, and apical septal HK, Gr1 DD, triv MR.  [name] Hyperlipidemia   . Hypertensive heart disease   . Ischemic cardiomyopathy    a. 07/2015 Echo: EF 45-50%.  . Morbid obesity ([place])         Past Surgical History:  Procedure Laterality Date  . CARDIAC CATHETERIZATION N/A 08/12/2015   Procedure: Left Heart Cath and Coronary Angiography;  Surgeon: Leonie Man, MD;  Location: [place] [facility];  Service: Cardiovascular;  Laterality: N/A;  . CORONARY ANGIOPLASTY    . HEMORRHOID SURGERY    . SKIN SURGERY      Allergies       Allergies  Allergen Reactions  . Other     ragweed  . Sulfa Antibiotics Other (See Comments)    Pt unsure    History of Present Illness    70 year old male with the above complex past medical history. Is a long history of hypertension, hyperlipidemia, and obesity. He was recently admitted to [facility] on July 24 after a few days of heartburn-like symptoms consistent finding of inferior ST segment elevation on ECG performed at primary care provider's office. He was referred to the ED and subsequently underwent emergent cath revealing a totally  occluded right coronary artery. This was successfully stented with 2 drug-eluting stents and he also required PTCA of the distal vessel. EF was 45-50%. Hospital course was relatively uneventful though he did have mild elevation of creatinine post catheterization.  Since discharge, he has done well without chest pain, dyspnea, PND, orthopnea, dizziness, syncope, edema, early satiety, or palpitations. ECG today shows rapid atrial flutter at 166 with a new right bundle branch block. He is completely asymptomatic though as we are talking, he is becoming anxious and diaphoretic.  Home Medications           Prior to Admission medications   Medication Sig Start Date End Date Taking? Authorizing Provider  amLODipine (NORVASC) 5 MG tablet TAKE 1 TABLET BY MOUTH DAILY 08/07/15   Girtha Rm, NP  aspirin EC 81 MG tablet Take 1 tablet (81 mg total) by mouth daily. 08/14/15   Arbutus Leas, NP  atorvastatin (LIPITOR) 80 MG tablet Take 1 tablet (80 mg total) by mouth daily at 6 PM. 08/14/15   Arbutus Leas, NP  cholecalciferol (VITAMIN D) 1000 units tablet Take 2,000 Units by mouth daily.     Historical Provider, MD  Coenzyme Q10 (COQ10) 200 MG CAPS Take 200 mg by mouth daily.    Historical Provider, MD  irbesartan (AVAPRO) 150 MG tablet Take 1 tablet (150 mg total) by mouth daily. 08/14/15   Junie Panning  Christain [place], NP  metoprolol succinate (TOPROL-XL) 25 MG 24 hr tablet Take 1 tablet (25 mg total) by mouth daily. 08/16/15   Lorretta Harp, MD  Multiple Vitamin (MULTIVITAMIN) tablet Take 1 tablet by mouth daily.    Historical Provider, MD  nitroGLYCERIN (NITROSTAT) 0.4 MG SL tablet Place 1 tablet (0.4 mg total) under the tongue every 5 (five) minutes as needed for chest pain. 08/14/15   Arbutus Leas, NP  Omega-3 Fatty Acids (FISH OIL) 1200 MG CPDR Take 1 tablet by mouth daily.    Historical Provider, MD  tadalafil (CIALIS) 20 MG tablet Take 20 mg by mouth daily as needed for erectile dysfunction.     Historical Provider, MD  ticagrelor (BRILINTA) 90 MG TABS tablet Take 1 tablet (90 mg total) by mouth 2 (two) times daily. 08/14/15   Arbutus Leas, NP  venlafaxine (EFFEXOR) 75 MG tablet TAKE 1 TABLET BY MOUTH EVERY DAY 06/24/15   Girtha Rm, NP    Family History         Family History  Problem Relation Age of Onset  . Diabetes Mother   . Heart disease Paternal Grandmother   . Heart disease Paternal Grandfather     Social History    Social History        Social History  . Marital status: Married    Spouse name: N/A  . Number of children: N/A  . Years of education: N/A      Occupational History  . Not on file.         Social History Main Topics  . Smoking status: Never Smoker  . Smokeless tobacco: Not on file  . Alcohol use 1.8 - 2.4 oz/week    3 - 4 Shots of liquor per week     Comment: 3-4 glasses of scotch 3-4 days per week  . Drug use: No  . Sexual activity: Not on file       Other Topics Concern  . Not on file      Social History Narrative  . No narrative on file     Review of Systems    General:  No chills, fever, night sweats or weight changes.  Cardiovascular:  No chest pain, dyspnea on exertion, edema, orthopnea, palpitations, paroxysmal nocturnal dyspnea. Dermatological: No rash, lesions/masses Respiratory: No cough, dyspnea Urologic: No hematuria, dysuria Abdominal:   No nausea, vomiting, diarrhea, bright red blood per rectum, melena, or hematemesis Neurologic:  No visual changes, wkns, changes in mental status. All other systems reviewed and are otherwise negative except as noted above.  Physical Exam    VS:  BP 112/62   Pulse (!) 166   Ht 6\' 2"  (1.88 m)   Wt (!) 303 lb 12.8 oz (137.8 kg)   BMI 39.01 kg/m  , BMI Body mass index is 39.01 kg/m. GEN: Well nourished, well developed, in no acute distress.  HEENT: normal.  Neck: Supple, Obese, Difficult to gauge JVP. No  carotid bruits, or masses. Cardiac:  RRR, tachy, no murmurs, rubs, or gallops. No clubbing, cyanosis, edema.  Radials/DP/PT 2+ and equal bilaterally.  Respiratory:  Respirations regular and unlabored, clear to auscultation bilaterally. GI: Soft, nontender, nondistended, BS + x 4. MS: no deformity or atrophy. Skin: warm and dry, no rash. Neuro:  Strength and sensation are intact. Psych: Normal affect.  Accessory Clinical Findings    ECG - aflutter, 166, new RBBB.  Assessment & Plan   1.  Posterior STEMI, subsequent episode of  care/CAD:  Pt was recently admitted with chest pain, ST elevation, and finding of An occluded right coronary artery. He was treated with 2 drug-eluting stents and also PTCA was performed to the distal vessel. He's been feeling well since discharge without chest pain or dyspnea. He is tolerating his medications. He has been found to be in rapid atrial flutter today and this will be addressed below.  2. Rapid atrial flutter: Patient presented today and his heart rate was 166 in atrial flutter with a new right bundle branch block. He has become slightly diaphoretic in talking about this, which she treats to anxiety related to requiring repeat auscultation, but is otherwise asymptomatic and hemodynamically stable. ECG reviewed with patient and also Dr. Ellyn Hack. Will plan to admit to [facility] and start IV diltiazem as well as Coumadin and heparin in the setting of a CHA2DS2VASc of 4. Coumadin has been chosen because he is also on aspirin Brilinta. Would plan to stop aspirin after 30 days of anticoagulation with Coumadin. If he does not convert with rate control, he may require TEE and cardioversion as we do not know what the duration of his atrial flutter is at this time. Ultimately, he would likely benefit from evaluation by EP for consideration of a flutter ablation.  3. Ischemic cardiopathy/chronic combined systolic and diastolic congestive heart failure: He is euvolemic. Continue current regimen.  4.  Hypertensive heart disease: Blood pressure currently stable in the setting of rapid atrial flutter. Follow.  5. Hyperlipidemia: He is on high potency statin therapy and will require follow-up lipids and LFTs in about 6 weeks.  6. Renal insufficiency: His creatinine got as high as 1.8 during his hospitalization. We will be sure to reevaluate his basic metabolic panel upon hospitalization.  Murray Hodgkins, NP 08/22/2015, 10:28 AM

## 2015-08-22 NOTE — ED Notes (Signed)
Patient eating roast beef sandwich brought by his daughter.

## 2015-08-22 NOTE — Patient Instructions (Addendum)
Patient given handout "Medications for a Healthy Heart".

## 2015-08-22 NOTE — Patient Instructions (Signed)
You are being admitted to [facility]. Please go to the Emergency Department.   [facility] [address]. There is valet parking available.

## 2015-08-22 NOTE — Progress Notes (Signed)
Cardiology Clinic Note   Patient Name: Leonard [name] Date of Encounter: 08/22/2015  Primary Care Provider:  [name], NP Primary Cardiologist:  Roni Bread, MD   Patient Profile    70 year old male status post recent posterior MI who presents to the office today for follow-up and found to be in rapid atrial flutter.  Past Medical History    Past Medical History:  Diagnosis Date  . Anxiety   . CAD (coronary artery disease)    a. 07/2015 Posterior STEMI/PCI: LM nl, LAd 40ost, RI 40, RCA 170m (3.0x22 Resolute Integrity DES, 3.0x12 Resolute Integrity DES), 90d (PTCA), RPDA small, nl, RPLB2 100 - too small for PTCA.  [name] Chronic combined systolic and diastolic CHF (congestive heart failure) ([place])    a. 07/2015 Ehco: EF 45-50%, basal-mid inf and infsept AK, inflat, apical inf, and apical septal HK, Gr1 DD, triv MR.  [name] Hyperlipidemia   . Hypertensive heart disease   . Ischemic cardiomyopathy    a. 07/2015 Echo: EF 45-50%.  . Morbid obesity ([place])    Past Surgical History:  Procedure Laterality Date  . CARDIAC CATHETERIZATION N/A 08/12/2015   Procedure: Left Heart Cath and Coronary Angiography;  Surgeon: Leonie Man, MD;  Location: [facility];  Service: Cardiovascular;  Laterality: N/A;  . CORONARY ANGIOPLASTY    . HEMORRHOID SURGERY    . SKIN SURGERY      Allergies  Allergies  Allergen Reactions  . Other     ragweed  . Sulfa Antibiotics Other (See Comments)    Pt unsure    History of Present Illness    70 year old male with the above complex past medical history. Is a long history of hypertension, hyperlipidemia, and obesity. He was recently admitted to [facility] on July 24 after a few days of heartburn-like symptoms consistent finding of inferior ST segment elevation on ECG performed at primary care provider's office. He was referred to the ED and subsequently underwent emergent cath revealing a totally occluded right coronary artery. This was successfully  stented with 2 drug-eluting stents and he also required PTCA of the distal vessel. EF was 45-50%. Hospital course was relatively uneventful though he did have mild elevation of creatinine post catheterization.  Since discharge, he has done well without chest pain, dyspnea, PND, orthopnea, dizziness, syncope, edema, early satiety, or palpitations. ECG today shows rapid atrial flutter at 166 with a new right bundle branch block. He is completely asymptomatic though as we are talking, he is becoming anxious and diaphoretic.  Home Medications    Prior to Admission medications   Medication Sig Start Date End Date Taking? Authorizing Provider  amLODipine (NORVASC) 5 MG tablet TAKE 1 TABLET BY MOUTH DAILY 08/07/15   Girtha Rm, NP  aspirin EC 81 MG tablet Take 1 tablet (81 mg total) by mouth daily. 08/14/15   Arbutus Leas, NP  atorvastatin (LIPITOR) 80 MG tablet Take 1 tablet (80 mg total) by mouth daily at 6 PM. 08/14/15   Arbutus Leas, NP  cholecalciferol (VITAMIN D) 1000 units tablet Take 2,000 Units by mouth daily.     Historical Provider, MD  Coenzyme Q10 (COQ10) 200 MG CAPS Take 200 mg by mouth daily.    Historical Provider, MD  irbesartan (AVAPRO) 150 MG tablet Take 1 tablet (150 mg total) by mouth daily. 08/14/15   Arbutus Leas, NP  metoprolol succinate (TOPROL-XL) 25 MG 24 hr tablet Take 1 tablet (25 mg total) by mouth daily.  08/16/15   Lorretta Harp, MD  Multiple Vitamin (MULTIVITAMIN) tablet Take 1 tablet by mouth daily.    Historical Provider, MD  nitroGLYCERIN (NITROSTAT) 0.4 MG SL tablet Place 1 tablet (0.4 mg total) under the tongue every 5 (five) minutes as needed for chest pain. 08/14/15   Arbutus Leas, NP  Omega-3 Fatty Acids (FISH OIL) 1200 MG CPDR Take 1 tablet by mouth daily.    Historical Provider, MD  tadalafil (CIALIS) 20 MG tablet Take 20 mg by mouth daily as needed for erectile dysfunction.    Historical Provider, MD  ticagrelor (BRILINTA) 90 MG TABS tablet Take 1 tablet (90  mg total) by mouth 2 (two) times daily. 08/14/15   Arbutus Leas, NP  venlafaxine (EFFEXOR) 75 MG tablet TAKE 1 TABLET BY MOUTH EVERY DAY 06/24/15   Girtha Rm, NP    Family History    Family History  Problem Relation Age of Onset  . Diabetes Mother   . Heart disease Paternal Grandmother   . Heart disease Paternal Grandfather     Social History    Social History   Social History  . Marital status: Married    Spouse name: N/A  . Number of children: N/A  . Years of education: N/A   Occupational History  . Not on file.   Social History Main Topics  . Smoking status: Never Smoker  . Smokeless tobacco: Not on file  . Alcohol use 1.8 - 2.4 oz/week    3 - 4 Shots of liquor per week     Comment: 3-4 glasses of scotch 3-4 days per week  . Drug use: No  . Sexual activity: Not on file   Other Topics Concern  . Not on file   Social History Narrative  . No narrative on file     Review of Systems    General:  No chills, fever, night sweats or weight changes.  Cardiovascular:  No chest pain, dyspnea on exertion, edema, orthopnea, palpitations, paroxysmal nocturnal dyspnea. Dermatological: No rash, lesions/masses Respiratory: No cough, dyspnea Urologic: No hematuria, dysuria Abdominal:   No nausea, vomiting, diarrhea, bright red blood per rectum, melena, or hematemesis Neurologic:  No visual changes, wkns, changes in mental status. All other systems reviewed and are otherwise negative except as noted above.  Physical Exam    VS:  BP 112/62   Pulse (!) 166   Ht 6\' 2"  (1.88 m)   Wt (!) 303 lb 12.8 oz (137.8 kg)   BMI 39.01 kg/m  , BMI Body mass index is 39.01 kg/m. GEN: Well nourished, well developed, in no acute distress.  HEENT: normal.  Neck: Supple, Obese, Difficult to gauge JVP. No  carotid bruits, or masses. Cardiac: RRR, tachy, no murmurs, rubs, or gallops. No clubbing, cyanosis, edema.  Radials/DP/PT 2+ and equal bilaterally.  Respiratory:  Respirations  regular and unlabored, clear to auscultation bilaterally. GI: Soft, nontender, nondistended, BS + x 4. MS: no deformity or atrophy. Skin: warm and dry, no rash. Neuro:  Strength and sensation are intact. Psych: Normal affect.  Accessory Clinical Findings    ECG - aflutter, 166, new RBBB.  Assessment & Plan   1.  Posterior STEMI, subsequent episode of care/CAD:  Pt was recently admitted with chest pain, ST elevation, and finding of An occluded right coronary artery. He was treated with 2 drug-eluting stents and also PTCA was performed to the distal vessel. He's been feeling well since discharge without chest pain or dyspnea. He  is tolerating his medications. He has been found to be in rapid atrial flutter today and this will be addressed below.  2. Rapid atrial flutter: Patient presented today and his heart rate was 166 in atrial flutter with a new right bundle branch block. He has become slightly diaphoretic in talking about this, which she treats to anxiety related to requiring repeat auscultation, but is otherwise asymptomatic and hemodynamically stable. ECG reviewed with patient and also Dr. Ellyn Hack. Will plan to admit to [facility] and start IV diltiazem as well as Coumadin and heparin in the setting of a CHA2DS2VASc of 4. Coumadin has been chosen because he is also on aspirin Brilinta. Would plan to stop aspirin after 30 days of anticoagulation with Coumadin. If he does not convert with rate control, he may require TEE and cardioversion as we do not know what the duration of his atrial flutter is at this time. Ultimately, he would likely benefit from evaluation by EP for consideration of a flutter ablation.  3. Ischemic cardiopathy/chronic combined systolic and diastolic congestive heart failure: He is euvolemic. Continue current regimen.  4. Hypertensive heart disease: Blood pressure currently stable in the setting of rapid atrial flutter. Follow.  5. Hyperlipidemia: He is on high potency  statin therapy and will require follow-up lipids and LFTs in about 6 weeks.  6. Renal insufficiency: His creatinine got as high as 1.8 during his hospitalization. We will be sure to reevaluate his basic metabolic panel upon hospitalization.  Murray Hodgkins, NP 08/22/2015, 10:28 AM

## 2015-08-22 NOTE — ED Notes (Signed)
Patient's stretcher switched out for hospital bed for comfort. Family at bedside. Patient ambulatory to restroom in exam room (10 feet away) with steady gait, denies CP/SOB. HR remains stable, rate in 140s/150s. MD aware.

## 2015-08-22 NOTE — Progress Notes (Signed)
Cards Fellow notified about patient's HR and Troponin. He is aware. Patient is asymptomatic. Verbals orders to increase Cardizem to max dose was given. Will initiate orders and continue to monitor patient.

## 2015-08-22 NOTE — Progress Notes (Signed)
08/22/2015 Leonard [name] Apr 04, 1945 [id number]   HPI:  Leonard [name] is a 70 y.o. male patient of Dr. Ellyn Hack, who was hospitalized from July 24 to July 26 with a primary diagnosis of STEMI.  His troponin peaked at > 65  He originally presented to his PCP with "heartburn" but after an EKG showed inferior ST elevations with Q waves in leads [name] and aVF, he was taken emergently to [facility] and the cath lab.  RCA was stented with 2 overlapping stents in the proximal region and balloon angioplasty in the distal region.  His medical history includes just hypertension and hyperlipidemia.  His primary MD notes that he has impaired fasting glucose, with an A1c of 6.1 earlier this year.    There were no vitals taken for this visit. SCr  1.52 (baseline 1.4) LDL 98   Medication  Dose Contra-indication  Antiplatelet ticagrelor 90 mg bid    Aspirin  81 mg qd   Beta-blocker Metoprolol succ 25 mg qd   ACEI/ARB Irbesartan  150 mg qd   High intensity statin atorvastatin 80 mg qd   Nitroglycerin  0.4 mg sl prn    Issues/Concerns:  Current Outpatient Prescriptions  Medication Sig Dispense Refill  . amLODipine (NORVASC) 5 MG tablet TAKE 1 TABLET BY MOUTH DAILY 90 tablet 0  . aspirin EC 81 MG tablet Take 1 tablet (81 mg total) by mouth daily.    [name] atorvastatin (LIPITOR) 80 MG tablet Take 1 tablet (80 mg total) by mouth daily at 6 PM. 30 tablet 12  . cholecalciferol (VITAMIN D) 1000 units tablet Take 1,000 Units by mouth daily.    . irbesartan (AVAPRO) 150 MG tablet Take 1 tablet (150 mg total) by mouth daily. 30 tablet 12  . metoprolol succinate (TOPROL-XL) 25 MG 24 hr tablet Take 1 tablet (25 mg total) by mouth daily. 90 tablet 1  . Multiple Vitamin (MULTIVITAMIN) tablet Take 1 tablet by mouth daily.    . nitroGLYCERIN (NITROSTAT) 0.4 MG SL tablet Place 1 tablet (0.4 mg total) under the tongue every 5 (five) minutes as needed for chest pain. 25 tablet 2  . Omega-3 Fatty Acids (FISH OIL) 1200  MG CPDR Take 1 tablet by mouth daily.    . tadalafil (CIALIS) 20 MG tablet Take 20 mg by mouth daily as needed for erectile dysfunction.    . ticagrelor (BRILINTA) 90 MG TABS tablet Take 1 tablet (90 mg total) by mouth 2 (two) times daily. 60 tablet 12  . ticagrelor (BRILINTA) 90 MG TABS tablet Take 1 tablet (90 mg total) by mouth 2 (two) times daily. 60 tablet 0  . venlafaxine (EFFEXOR) 75 MG tablet TAKE 1 TABLET BY MOUTH EVERY DAY 90 tablet 0   No current facility-administered medications for this visit.     Allergies  Allergen Reactions  . Other     ragweed  . Sulfa Antibiotics Other (See Comments)    Pt unsure    Past Medical History:  Diagnosis Date  . Anxiety   . CAD (coronary artery disease)    July 2017 inferior STEMI DES x 2 to Mid RCA   . Hyperlipidemia   . Hypertension

## 2015-08-22 NOTE — ED Notes (Signed)
patient placed on 02 at 2 liters

## 2015-08-22 NOTE — ED Notes (Signed)
Patient states he is feelinf fine no pain

## 2015-08-23 ENCOUNTER — Inpatient Hospital Stay (HOSPITAL_COMMUNITY): Payer: Medicare Other | Admitting: Certified Registered"

## 2015-08-23 ENCOUNTER — Inpatient Hospital Stay (HOSPITAL_COMMUNITY): Payer: Medicare Other

## 2015-08-23 ENCOUNTER — Encounter (HOSPITAL_COMMUNITY)
Admission: EM | Disposition: A | Discharge status: Home / Self Care | Payer: Self-pay | Source: Home / Self Care | Attending: Cardiology

## 2015-08-23 ENCOUNTER — Encounter (HOSPITAL_COMMUNITY): Payer: Self-pay | Admitting: Anesthesiology

## 2015-08-23 ENCOUNTER — Encounter (HOSPITAL_COMMUNITY): Payer: Self-pay | Admitting: Certified Registered"

## 2015-08-23 ENCOUNTER — Other Ambulatory Visit: Payer: Self-pay

## 2015-08-23 DIAGNOSIS — I472 Ventricular tachycardia: Principal | ICD-10-CM

## 2015-08-23 DIAGNOSIS — I255 Ischemic cardiomyopathy: Secondary | ICD-10-CM

## 2015-08-23 HISTORY — PX: CARDIOVERSION: SHX1299

## 2015-08-23 LAB — BASIC METABOLIC PANEL
Anion gap: 12 (ref 5–15)
BUN: 30 mg/dL — AB (ref 6–20)
CHLORIDE: 101 mmol/L (ref 101–111)
CO2: 23 mmol/L (ref 22–32)
CREATININE: 1.35 mg/dL — AB (ref 0.61–1.24)
Calcium: 8.8 mg/dL — ABNORMAL LOW (ref 8.9–10.3)
GFR calc Af Amer: 59 mL/min — ABNORMAL LOW (ref >60)
GFR calc non Af Amer: 51 mL/min — ABNORMAL LOW (ref >60)
Glucose, Bld: 133 mg/dL — ABNORMAL HIGH (ref 65–99)
Potassium: 3.9 mmol/L (ref 3.5–5.1)
Sodium: 136 mmol/L (ref 135–145)

## 2015-08-23 LAB — CBC
HCT: 45.6 % (ref 39.0–52.0)
Hemoglobin: 14.6 g/dL (ref 13.0–17.0)
MCH: 31.7 pg (ref 26.0–34.0)
MCHC: 32 g/dL (ref 30.0–36.0)
MCV: 99.1 fL (ref 78.0–100.0)
PLATELETS: 385 10*3/uL (ref 150–400)
RBC: 4.6 MIL/uL (ref 4.22–5.81)
RDW: 14 % (ref 11.5–15.5)
WBC: 15.4 10*3/uL — ABNORMAL HIGH (ref 4.0–10.5)

## 2015-08-23 LAB — ECHOCARDIOGRAM COMPLETE
HEIGHTINCHES: 74 in
Weight: 4928 oz

## 2015-08-23 LAB — TROPONIN I
TROPONIN I: 1.61 ng/mL — AB (ref <0.03)
Troponin I: 1.37 ng/mL (ref <0.03)

## 2015-08-23 LAB — PROTIME-INR
INR: 1.14
PROTHROMBIN TIME: 14.7 s (ref 11.4–15.2)

## 2015-08-23 SURGERY — CARDIOVERSION
Anesthesia: Choice

## 2015-08-23 MED ORDER — PROPOFOL 10 MG/ML IV BOLUS
INTRAVENOUS | Status: DC | PRN
  Administered 2015-08-23: 110 mg via INTRAVENOUS

## 2015-08-23 MED ORDER — ADENOSINE 6 MG/2ML IV SOLN
INTRAVENOUS | Status: AC
  Filled 2015-08-23: qty 2

## 2015-08-23 MED ORDER — PERFLUTREN LIPID MICROSPHERE
INTRAVENOUS | Status: AC
  Administered 2015-08-23: 11:00:00
  Filled 2015-08-23: qty 10

## 2015-08-23 MED ORDER — ADENOSINE 6 MG/2ML IV SOLN
INTRAVENOUS | Status: AC
  Administered 2015-08-23: 6 mg
  Filled 2015-08-23: qty 2

## 2015-08-23 MED ORDER — ADENOSINE 6 MG/2ML IV SOLN
INTRAVENOUS | Status: AC
  Administered 2015-08-23: 12 mg
  Filled 2015-08-23: qty 4

## 2015-08-23 MED ORDER — ADENOSINE 6 MG/2ML IV SOLN: 12.0000 mg | Freq: Once | INTRAVENOUS | Status: AC

## 2015-08-23 MED ORDER — PERFLUTREN LIPID MICROSPHERE
1.0000 mL | INTRAVENOUS | Status: DC | PRN
  Filled 2015-08-23: qty 10

## 2015-08-23 MED ORDER — ADENOSINE 6 MG/2ML IV SOLN
INTRAVENOUS | Status: AC
  Administered 2015-08-23: 6 mg
  Filled 2015-08-23: qty 4

## 2015-08-23 MED ORDER — WARFARIN SODIUM 7.5 MG PO TABS: 7.5000 mg | ORAL_TABLET | Freq: Once | ORAL | Status: DC

## 2015-08-23 NOTE — H&P (View-Only) (Signed)
ELECTROPHYSIOLOGY CONSULT NOTE    Patient ID: Leonard [name] MRN: [id number], DOB/AGE: [birth year] 70 y.o.  Admit date: 08/22/2015 Date of Consult: 08/23/2015  Primary Physician: [name], NP Primary Cardiologist: Ellyn Hack Referring Physician: Acie Fredrickson  Reason for Consultation: wide complex tachycardia   HPI:  Leonard [name] is a 70 y.o. male with a past medical history significant for obesity, hypertension, hyperlipidemia, and recent inferior STEMI s/p DES to RCA.  He has done well post discharge without symptoms of recurrent chest pain or shortness of breath.  He presented to the office yesterday for routine scheduled follow up and was found to be in wide complex tachycardia at a rate of 166.  He was without chest pain or shortness of breath at that time. He was admitted to the hospital for further evaluation.  He has remained in tachycardia overnight with no change in rate despite Cardizem and amiodarone drips.  He was also given adenosine early this morning with no change in heart rate.  This morning, he remains in wide complex tachycardia at a rate of 166bpm. He was seen by Dr Caryl Comes and given 12mg  of adenosine with again no change in HR but possibly increased VA dissociation.    He currently denies chest pain, shortness of breath, recent fevers, chills, nausea or vomiting.  He has not had dizziness, pre-syncope or syncope.  He has no awareness of his tachycardia.   Echo 08/13/15 demonstrated EF 45-50%, akinesis of the basal-mid inferior and inferoseptal myocardium, grade 1 diastolic dysfunction, LA 39.    Past Medical History:  Diagnosis Date  . Anxiety   . Atrial flutter ([facility])    a. CHA2DS2VASc = 4.  [name] CAD (coronary artery disease)    a. 07/2015 Posterior STEMI/PCI: LM nl, LAd 40ost, RI 40, RCA 110m (3.0x22 Resolute Integrity DES, 3.0x12 Resolute Integrity DES), 90d (PTCA), RPDA small, nl, RPLB2 100 - too small for PTCA.  [name] Chronic combined systolic and diastolic CHF  (congestive heart failure) ([place])    a. 07/2015 Ehco: EF 45-50%, basal-mid inf and infsept AK, inflat, apical inf, and apical septal HK, Gr1 DD, triv MR.  [name] Hyperlipidemia   . Hypertensive heart disease   . Ischemic cardiomyopathy    a. 07/2015 Echo: EF 45-50%.  . Morbid obesity [facility])      Surgical History:  Past Surgical History:  Procedure Laterality Date  . CARDIAC CATHETERIZATION N/A 08/12/2015   Procedure: Left Heart Cath and Coronary Angiography;  Surgeon: Leonie Man, MD;  Location: [place] [facility];  Service: Cardiovascular;  Laterality: N/A;  . CORONARY ANGIOPLASTY    . HEMORRHOID SURGERY    . SKIN SURGERY       Prescriptions Prior to Admission  Medication Sig Dispense Refill Last Dose  . amLODipine (NORVASC) 5 MG tablet TAKE 1 TABLET BY MOUTH DAILY 90 tablet 0 08/22/2015 at Unknown time  . aspirin EC 81 MG tablet Take 1 tablet (81 mg total) by mouth daily.   08/22/2015 at Unknown time  . atorvastatin (LIPITOR) 80 MG tablet Take 1 tablet (80 mg total) by mouth daily at 6 PM. 30 tablet 12 08/21/2015 at Unknown time  . cholecalciferol (VITAMIN D) 1000 units tablet Take 2,000 Units by mouth daily.    Past Month at Unknown time  . Coenzyme Q10 (COQ10) 200 MG CAPS Take 200 mg by mouth daily.   Past Month at Unknown time  . irbesartan (AVAPRO) 150 MG tablet Take 1 tablet (150 mg total) by mouth  daily. 30 tablet 12 08/22/2015 at Unknown time  . ketotifen (ZADITOR) 0.025 % ophthalmic solution Place 1 drop into both eyes as needed (for dry eyes).   Past Month at Unknown time  . metoprolol succinate (TOPROL-XL) 25 MG 24 hr tablet Take 1 tablet (25 mg total) by mouth daily. 90 tablet 1 08/22/2015 at 0800  . Multiple Vitamin (MULTIVITAMIN) tablet Take 1 tablet by mouth daily.   Past Month at Unknown time  . Omega-3 Fatty Acids (FISH OIL) 1200 MG CPDR Take 1 tablet by mouth daily.   Past Month at Unknown time  . ticagrelor (BRILINTA) 90 MG TABS tablet Take 1 tablet (90 mg total) by mouth 2 (two)  times daily. 60 tablet 12 08/22/2015 at Unknown time  . venlafaxine (EFFEXOR) 75 MG tablet TAKE 1 TABLET BY MOUTH EVERY DAY 90 tablet 0 08/22/2015 at Unknown time  . nitroGLYCERIN (NITROSTAT) 0.4 MG SL tablet Place 1 tablet (0.4 mg total) under the tongue every 5 (five) minutes as needed for chest pain. 25 tablet 2 Unknown at Unknown  . tadalafil (CIALIS) 20 MG tablet Take 20 mg by mouth daily as needed for erectile dysfunction.   Unknown at Unknown    Inpatient Medications:  . adenosine      . aspirin EC  81 mg Oral Daily  . atorvastatin  80 mg Oral q1800  . cholecalciferol  2,000 Units Oral Daily  . enoxaparin (LOVENOX) injection  1 mg/kg Subcutaneous Q12H  . irbesartan  150 mg Oral Daily  . metoprolol succinate  25 mg Oral Daily  . multivitamin with minerals  1 tablet Oral Daily  . omega-3 acid ethyl esters  1 g Oral Daily  . sodium chloride flush  3 mL Intravenous Q12H  . ticagrelor  90 mg Oral BID  . venlafaxine  75 mg Oral Daily  . warfarin  7.5 mg Oral ONCE-1800  . Warfarin - Pharmacist Dosing Inpatient   Does not apply q1800    Allergies:  Allergies  Allergen Reactions  . Other Other (See Comments)    ragweed  . Sulfa Antibiotics Other (See Comments)    Pt unsure    Social History   Social History  . Marital status: Married    Spouse name: N/A  . Number of children: N/A  . Years of education: N/A   Occupational History  . Not on file.   Social History Main Topics  . Smoking status: Never Smoker  . Smokeless tobacco: Never Used  . Alcohol use 1.8 - 2.4 oz/week    3 - 4 Shots of liquor per week     Comment: 3-4 glasses of scotch 3-4 days per week  . Drug use: No  . Sexual activity: Not on file   Other Topics Concern  . Not on file   Social History Narrative  . No narrative on file     Family History  Problem Relation Age of Onset  . Diabetes Mother   . Heart disease Paternal Grandmother   . Heart disease Paternal Grandfather      Review of  Systems: All other systems reviewed and are otherwise negative except as noted above.  Physical Exam: Vitals:   08/23/15 0600 08/23/15 0700 08/23/15 0800 08/23/15 0900  BP: 95/81 (!) 105/91 107/80 117/82  Pulse: (!) 166 (!) 164 (!) 167 (!) 167  Resp: 14 16 11 20   Temp:  98.2 F (36.8 C)    TempSrc:  Oral    SpO2: 96% 96% 94%  95%  Weight:      Height:        GEN- The patient is elderly and obese appearing, alert and oriented x 3 today.   HEENT: normocephalic, atraumatic; sclera clear, conjunctiva pink; hearing intact; oropharynx clear; neck supple  Lungs- Clear to ausculation bilaterally, normal work of breathing.  No wheezes, rales, rhonchi Heart- Tachycardic regular rate and rhythm  GI- obese, non-tender, non-distended, bowel sounds present  Extremities- no clubbing, cyanosis, or edema; DP/PT/radial pulses 2+ bilaterally MS- no significant deformity or atrophy Skin- warm and dry, no rash or lesion Psych- euthymic mood, full affect Neuro- strength and sensation are intact  Labs:   Lab Results  Component Value Date   WBC 15.4 (H) 08/23/2015   HGB 14.6 08/23/2015   HCT 45.6 08/23/2015   MCV 99.1 08/23/2015   PLT 385 08/23/2015    Recent Labs Lab 08/22/15 1130 08/23/15 0330  NA 134* 136  K 4.5 3.9  CL 105 101  CO2 18* 23  BUN 30* 30*  CREATININE 1.43* 1.35*  CALCIUM 9.2 8.8*  PROT 6.7  --   BILITOT 0.5  --   ALKPHOS 93  --   ALT 56  --   AST 46*  --   GLUCOSE 152* 133*      Radiology/Studies: Dg Chest 2 View Result Date: 08/14/2015 CLINICAL DATA:  Recent myocardial infarct. EXAM: CHEST  2 VIEW COMPARISON:  None. FINDINGS: There is atelectatic change in the posterior right base. Lungs elsewhere clear. Heart is borderline enlarged with pulmonary vascular within normal limits. There is atherosclerotic calcification in the aortic arch. There is degenerative change in the thoracic spine. IMPRESSION: Right base atelectasis. No edema or consolidation. Borderline  cardiomegaly. Aortic atherosclerosis. Electronically Signed   By: Lowella Grip [name] M.D.   On: 08/14/2015 07:42  Dg Chest Portable 1 View Result Date: 08/22/2015 CLINICAL DATA:  Tachycardia with irregular heartbeat. EXAM: PORTABLE CHEST 1 VIEW COMPARISON:  08/14/2015. FINDINGS: Cardiomegaly. No active infiltrates or failure. RIGHT base atelectasis is improved. No effusion or pneumothorax. IMPRESSION: Cardiomegaly.  No active disease.  Improved aeration. Electronically Signed   By: Staci Righter M.D.   On: 08/22/2015 13:31    EKG: wide complex tachycardia, rate 164, inferior axis, RBBB  TELEMETRY: WCT, rate 166  Assessment/Plan: 1. Wide complex tachycardia With recent inferior MI, concern is that this is ventricular tachycardia. He has failed to terminate or slow with Cardizem, Amiodarone, adenosine.  At this point, DCCV is likely best option. Risks, benefits discussed by Dr Caryl Comes with patient and wife who wish to proceed. Will plan for this morning with anesthesia support.  Will plan EPS on Monday to evaluate further. If VT induced at that time, recommendation would be ICD for secondary prevention  2.  CAD/recent inferior STEMI/ischemic cardiomyopathy  Pt with no recent ischemic symptoms Update echo  Continue medical therapy Ok per Dr Caryl Comes to stay on Brilinta with possible upcoming procedures As VT is arising from area of recent MI, will discuss with Dr Caryl Comes need for possible relook cath prior to EPS.   3.  Morbid obesity Body mass index is 39.54 kg/m. Weight loss encouarged  4.  Snoring/daytime somnolence He has the body habitus for sleep apnea and his wife describes sleep disordered breathing Would recommend outpatient sleep study   5.  Chronic systolic heart failure Currently euvolemic Continue current therapy     Signed, Chanetta Marshall, NP 08/23/2015 9:32 AM  2:15PM  Spoke with patient. States in retrospect,  now that he is in SR, he probably has been short of breath  for the last couple of days. Doing well now without complaints. Discussed with Dr Ellyn Hack who knows him well.  He feels it would be reasonable to do relook cath Monday to make sure RCA still open.  Discussed with patient.    Chanetta Marshall, NP 08/23/2015 2:27 PM   As above Pt seen and examined   Presents with WCT most consistent ( ubutnot completely) with VT.  Much less likely SVT and still less At Flutter   Hence we elected cardioversion. Will need EPS to clarify mechanism of WCT and if inducible/ no SVT inducible will implant ICD.   Continue ARB and BB Plan for Cath now advanced--will defer to Dr Ellyn Hack.

## 2015-08-23 NOTE — Progress Notes (Signed)
  Echocardiogram 2D Echocardiogram has been performed with definity.  Aggie Cosier 08/23/2015, 11:34 AM

## 2015-08-23 NOTE — Progress Notes (Signed)
Brief note-- WCT in setting of prior MI and depressed LV funciton ECG criteria suggest VT with monophasic R wave V1 but not by aVR criteria with qWave<40 msec.  Neither CSM nor Adenosine 12 mg failed to terminate and we could not be sure of retrograde VA block  Will undertake DCCV-- I dont think this is Aflutter.    We will anticipate EPS on Monday for mechanism clarification and treatment options Suspect this is VT that will be demonstrated at EPS-- at that juncture ICD would be appropriate.  Recurrences could potentially be ablated

## 2015-08-23 NOTE — Progress Notes (Signed)
MD at bedside. Patient has patent IV with good blood return. Zoll applied to patient.  6mg , 6mg , and 12mg  of Adenosine given. Patient had no response to intervention. Patient denies SOB and chest pain. Zoll remains connected to patient.

## 2015-08-23 NOTE — Progress Notes (Signed)
Patient HR still maintaining in 160s. Repeat EKG was done. Showed SVT. MD called and notified. MD at bedside to assist with giving Adenosine.

## 2015-08-23 NOTE — Progress Notes (Signed)
Patient remained in SVT with rates of 160 all night. Asymptoamtic. BP of 90's. Nurse called me to bedside. I reviewed prior records. Rhythm was believed to be flutter. I gave the patient adenosine to investigate the  nature of the rhythm further. I gave 6+6+12. No change I rhythm with only one bout of shortness of breath at higher dose. I aborted dose escalation at that time point. Pt continued on his cardiazem and amio drip.    Cristina Gong, MD

## 2015-08-23 NOTE — Consult Note (Signed)
ELECTROPHYSIOLOGY CONSULT NOTE    Patient ID: Leonard [name] MRN: [id number], DOB/AGE: [birth year] 70 y.o.  Admit date: 08/22/2015 Date of Consult: 08/23/2015  Primary Physician: [name], NP Primary Cardiologist: Ellyn Hack Referring Physician: Acie Fredrickson  Reason for Consultation: wide complex tachycardia   HPI:  Leonard [name] is a 70 y.o. male with a past medical history significant for obesity, hypertension, hyperlipidemia, and recent inferior STEMI s/p DES to RCA.  He has done well post discharge without symptoms of recurrent chest pain or shortness of breath.  He presented to the office yesterday for routine scheduled follow up and was found to be in wide complex tachycardia at a rate of 166.  He was without chest pain or shortness of breath at that time. He was admitted to the hospital for further evaluation.  He has remained in tachycardia overnight with no change in rate despite Cardizem and amiodarone drips.  He was also given adenosine early this morning with no change in heart rate.  This morning, he remains in wide complex tachycardia at a rate of 166bpm. He was seen by Dr Caryl Comes and given 12mg  of adenosine with again no change in HR but possibly increased VA dissociation.    He currently denies chest pain, shortness of breath, recent fevers, chills, nausea or vomiting.  He has not had dizziness, pre-syncope or syncope.  He has no awareness of his tachycardia.   Echo 08/13/15 demonstrated EF 45-50%, akinesis of the basal-mid inferior and inferoseptal myocardium, grade 1 diastolic dysfunction, LA 39.    Past Medical History:  Diagnosis Date  . Anxiety   . Atrial flutter (HCC)    a. CHA2DS2VASc = 4.  [name] CAD (coronary artery disease)    a. 07/2015 Posterior STEMI/PCI: LM nl, LAd 40ost, RI 40, RCA 151m (3.0x22 Resolute Integrity DES, 3.0x12 Resolute Integrity DES), 90d (PTCA), RPDA small, nl, RPLB2 100 - too small for PTCA.  [name] Chronic combined systolic and diastolic CHF  (congestive heart failure) ([facility])    a. 07/2015 Ehco: EF 45-50%, basal-mid inf and infsept AK, inflat, apical inf, and apical septal HK, Gr1 DD, triv MR.  [name] Hyperlipidemia   . Hypertensive heart disease   . Ischemic cardiomyopathy    a. 07/2015 Echo: EF 45-50%.  . Morbid obesity [facility])      Surgical History:  Past Surgical History:  Procedure Laterality Date  . CARDIAC CATHETERIZATION N/A 08/12/2015   Procedure: Left Heart Cath and Coronary Angiography;  Surgeon: Leonie Man, MD;  Location: [place] [facility];  Service: Cardiovascular;  Laterality: N/A;  . CORONARY ANGIOPLASTY    . HEMORRHOID SURGERY    . SKIN SURGERY       Prescriptions Prior to Admission  Medication Sig Dispense Refill Last Dose  . amLODipine (NORVASC) 5 MG tablet TAKE 1 TABLET BY MOUTH DAILY 90 tablet 0 08/22/2015 at Unknown time  . aspirin EC 81 MG tablet Take 1 tablet (81 mg total) by mouth daily.   08/22/2015 at Unknown time  . atorvastatin (LIPITOR) 80 MG tablet Take 1 tablet (80 mg total) by mouth daily at 6 PM. 30 tablet 12 08/21/2015 at Unknown time  . cholecalciferol (VITAMIN D) 1000 units tablet Take 2,000 Units by mouth daily.    Past Month at Unknown time  . Coenzyme Q10 (COQ10) 200 MG CAPS Take 200 mg by mouth daily.   Past Month at Unknown time  . irbesartan (AVAPRO) 150 MG tablet Take 1 tablet (150 mg total) by mouth  daily. 30 tablet 12 08/22/2015 at Unknown time  . ketotifen (ZADITOR) 0.025 % ophthalmic solution Place 1 drop into both eyes as needed (for dry eyes).   Past Month at Unknown time  . metoprolol succinate (TOPROL-XL) 25 MG 24 hr tablet Take 1 tablet (25 mg total) by mouth daily. 90 tablet 1 08/22/2015 at 0800  . Multiple Vitamin (MULTIVITAMIN) tablet Take 1 tablet by mouth daily.   Past Month at Unknown time  . Omega-3 Fatty Acids (FISH OIL) 1200 MG CPDR Take 1 tablet by mouth daily.   Past Month at Unknown time  . ticagrelor (BRILINTA) 90 MG TABS tablet Take 1 tablet (90 mg total) by mouth 2 (two)  times daily. 60 tablet 12 08/22/2015 at Unknown time  . venlafaxine (EFFEXOR) 75 MG tablet TAKE 1 TABLET BY MOUTH EVERY DAY 90 tablet 0 08/22/2015 at Unknown time  . nitroGLYCERIN (NITROSTAT) 0.4 MG SL tablet Place 1 tablet (0.4 mg total) under the tongue every 5 (five) minutes as needed for chest pain. 25 tablet 2 Unknown at Unknown  . tadalafil (CIALIS) 20 MG tablet Take 20 mg by mouth daily as needed for erectile dysfunction.   Unknown at Unknown    Inpatient Medications:  . adenosine      . aspirin EC  81 mg Oral Daily  . atorvastatin  80 mg Oral q1800  . cholecalciferol  2,000 Units Oral Daily  . enoxaparin (LOVENOX) injection  1 mg/kg Subcutaneous Q12H  . irbesartan  150 mg Oral Daily  . metoprolol succinate  25 mg Oral Daily  . multivitamin with minerals  1 tablet Oral Daily  . omega-3 acid ethyl esters  1 g Oral Daily  . sodium chloride flush  3 mL Intravenous Q12H  . ticagrelor  90 mg Oral BID  . venlafaxine  75 mg Oral Daily  . warfarin  7.5 mg Oral ONCE-1800  . Warfarin - Pharmacist Dosing Inpatient   Does not apply q1800    Allergies:  Allergies  Allergen Reactions  . Other Other (See Comments)    ragweed  . Sulfa Antibiotics Other (See Comments)    Pt unsure    Social History   Social History  . Marital status: Married    Spouse name: N/A  . Number of children: N/A  . Years of education: N/A   Occupational History  . Not on file.   Social History Main Topics  . Smoking status: Never Smoker  . Smokeless tobacco: Never Used  . Alcohol use 1.8 - 2.4 oz/week    3 - 4 Shots of liquor per week     Comment: 3-4 glasses of scotch 3-4 days per week  . Drug use: No  . Sexual activity: Not on file   Other Topics Concern  . Not on file   Social History Narrative  . No narrative on file     Family History  Problem Relation Age of Onset  . Diabetes Mother   . Heart disease Paternal Grandmother   . Heart disease Paternal Grandfather      Review of  Systems: All other systems reviewed and are otherwise negative except as noted above.  Physical Exam: Vitals:   08/23/15 0600 08/23/15 0700 08/23/15 0800 08/23/15 0900  BP: 95/81 (!) 105/91 107/80 117/82  Pulse: (!) 166 (!) 164 (!) 167 (!) 167  Resp: 14 16 11 20   Temp:  98.2 F (36.8 C)    TempSrc:  Oral    SpO2: 96% 96% 94%  95%  Weight:      Height:        GEN- The patient is elderly and obese appearing, alert and oriented x 3 today.   HEENT: normocephalic, atraumatic; sclera clear, conjunctiva pink; hearing intact; oropharynx clear; neck supple  Lungs- Clear to ausculation bilaterally, normal work of breathing.  No wheezes, rales, rhonchi Heart- Tachycardic regular rate and rhythm  GI- obese, non-tender, non-distended, bowel sounds present  Extremities- no clubbing, cyanosis, or edema; DP/PT/radial pulses 2+ bilaterally MS- no significant deformity or atrophy Skin- warm and dry, no rash or lesion Psych- euthymic mood, full affect Neuro- strength and sensation are intact  Labs:   Lab Results  Component Value Date   WBC 15.4 (H) 08/23/2015   HGB 14.6 08/23/2015   HCT 45.6 08/23/2015   MCV 99.1 08/23/2015   PLT 385 08/23/2015    Recent Labs Lab 08/22/15 1130 08/23/15 0330  NA 134* 136  K 4.5 3.9  CL 105 101  CO2 18* 23  BUN 30* 30*  CREATININE 1.43* 1.35*  CALCIUM 9.2 8.8*  PROT 6.7  --   BILITOT 0.5  --   ALKPHOS 93  --   ALT 56  --   AST 46*  --   GLUCOSE 152* 133*      Radiology/Studies: Dg Chest 2 View Result Date: 08/14/2015 CLINICAL DATA:  Recent myocardial infarct. EXAM: CHEST  2 VIEW COMPARISON:  None. FINDINGS: There is atelectatic change in the posterior right base. Lungs elsewhere clear. Heart is borderline enlarged with pulmonary vascular within normal limits. There is atherosclerotic calcification in the aortic arch. There is degenerative change in the thoracic spine. IMPRESSION: Right base atelectasis. No edema or consolidation. Borderline  cardiomegaly. Aortic atherosclerosis. Electronically Signed   By: Lowella Grip [name] M.D.   On: 08/14/2015 07:42  Dg Chest Portable 1 View Result Date: 08/22/2015 CLINICAL DATA:  Tachycardia with irregular heartbeat. EXAM: PORTABLE CHEST 1 VIEW COMPARISON:  08/14/2015. FINDINGS: Cardiomegaly. No active infiltrates or failure. RIGHT base atelectasis is improved. No effusion or pneumothorax. IMPRESSION: Cardiomegaly.  No active disease.  Improved aeration. Electronically Signed   By: Staci Righter M.D.   On: 08/22/2015 13:31    EKG: wide complex tachycardia, rate 164, inferior axis, RBBB  TELEMETRY: WCT, rate 166  Assessment/Plan: 1. Wide complex tachycardia With recent inferior MI, concern is that this is ventricular tachycardia. He has failed to terminate or slow with Cardizem, Amiodarone, adenosine.  At this point, DCCV is likely best option. Risks, benefits discussed by Dr Caryl Comes with patient and wife who wish to proceed. Will plan for this morning with anesthesia support.  Will plan EPS on Monday to evaluate further. If VT induced at that time, recommendation would be ICD for secondary prevention  2.  CAD/recent inferior STEMI/ischemic cardiomyopathy  Pt with no recent ischemic symptoms Update echo  Continue medical therapy Ok per Dr Caryl Comes to stay on Brilinta with possible upcoming procedures As VT is arising from area of recent MI, will discuss with Dr Caryl Comes need for possible relook cath prior to EPS.   3.  Morbid obesity Body mass index is 39.54 kg/m. Weight loss encouarged  4.  Snoring/daytime somnolence He has the body habitus for sleep apnea and his wife describes sleep disordered breathing Would recommend outpatient sleep study   5.  Chronic systolic heart failure Currently euvolemic Continue current therapy     Signed, Chanetta Marshall, NP 08/23/2015 9:32 AM  2:15PM  Spoke with patient. States in retrospect,  now that he is in SR, he probably has been short of breath  for the last couple of days. Doing well now without complaints. Discussed with Dr Ellyn Hack who knows him well.  He feels it would be reasonable to do relook cath Monday to make sure RCA still open.  Discussed with patient.    Chanetta Marshall, NP 08/23/2015 2:27 PM   As above Pt seen and examined   Presents with WCT most consistent ( ubutnot completely) with VT.  Much less likely SVT and still less At Flutter   Hence we elected cardioversion. Will need EPS to clarify mechanism of WCT and if inducible/ no SVT inducible will implant ICD.   Continue ARB and BB Plan for Cath now advanced--will defer to Dr Ellyn Hack.

## 2015-08-23 NOTE — Anesthesia Preprocedure Evaluation (Signed)
Anesthesia Evaluation  Patient identified by MRN, date of birth, ID band Patient awake    Reviewed: Allergy & Precautions, NPO status , Patient's Chart, lab work & pertinent test results  Airway Mallampati: II   Neck ROM: Full    Dental  (+) Teeth Intact, Dental Advisory Given   Pulmonary           Cardiovascular hypertension, Pt. on medications and Pt. on home beta blockers + CAD and + Past MI       Neuro/Psych    GI/Hepatic   Endo/Other    Renal/GU      Musculoskeletal   Abdominal   Peds  Hematology   Anesthesia Other Findings   Reproductive/Obstetrics                             Anesthesia Physical Anesthesia Plan  ASA: III  Anesthesia Plan: General   Post-op Pain Management:    Induction: Intravenous  Airway Management Planned: Mask  Additional Equipment: None  Intra-op Plan:   Post-operative Plan: Extubation in OR  Informed Consent: I have reviewed the patients History and Physical, chart, labs and discussed the procedure including the risks, benefits and alternatives for the proposed anesthesia with the patient or authorized representative who has indicated his/her understanding and acceptance.   Dental advisory given  Plan Discussed with:   Anesthesia Plan Comments:         Anesthesia Quick Evaluation

## 2015-08-23 NOTE — Progress Notes (Signed)
Leonard [name] for Lovenox + warfarin Indication: atrial fibrillation  Allergies  Allergen Reactions  . Other Other (See Comments)    ragweed  . Sulfa Antibiotics Other (See Comments)    Pt unsure    Patient Measurements: Height: 6\' 2"  (188 cm) Weight: (!) 308 lb (139.7 kg) IBW/kg (Calculated) : 82.2   Vital Signs: Temp: 98.2 F (36.8 C) (08/04 0700) Temp Source: Oral (08/04 0700) BP: 107/80 (08/04 0800) Pulse Rate: 167 (08/04 0900)  Labs:  Recent Labs  08/22/15 1130 08/22/15 1951 08/22/15 2329 08/23/15 0330  HGB 16.0  --   --  14.6  HCT 47.8  --   --  45.6  PLT 423*  --   --  385  LABPROT 14.9  --   --  14.7  INR 1.16  --   --  1.14  CREATININE 1.43*  --   --  1.35*  TROPONINI  --  1.65* 1.61* 1.37*    Estimated Creatinine Clearance: 73.6 mL/min (by C-G formula based on SCr of 1.35 mg/dL).   Assessment: 71 YOM recently discharged on 7/26 after hospitalization for STEMI and s/p stent placement. He is now here for aflutter and on coumadin and aspirin. He is noted on brilinta and plans to continue then drop aspirin 30 days post stent. There is not much published experience with using brilinta with a DOAC.  -SCr= 1.35, CrCl ~ 70, INR= 1.14   Goal of Therapy:  Anti-Xa level 0.6-1 units/ml 4hrs after LMWH dose given Monitor platelets by anticoagulation protocol: Yes   Plan:   -Continue Lovenox 140mg  subQ q12h (1mg /kg) -Wafarin 7.5mg  po x1 tonight  -CBC q72h, daily INR  Hildred Laser, Pharm D 08/23/2015 9:19 AM

## 2015-08-23 NOTE — CV Procedure (Signed)
Preop DxWCT prob VT Post op DX  NSR*   Procedure  DC Cardioversion   Pt was sedated by anesthesia receiving Propafol  A synchronized shock120 joules restored sinus Rhythm   Pt tolerated without difficulty

## 2015-08-23 NOTE — Progress Notes (Signed)
Preop Dx *Widecomplex tachycardia  Post op DX  NSR   Procedure  DC Cardioversion   Pt was sedated by anesthesia receiving   Propafol  A synchronized shock *120* joules restored sinus Rhythm   Pt tolerated without difficulty

## 2015-08-23 NOTE — Transfer of Care (Signed)
Immediate Anesthesia Transfer of Care Note  Patient: Leonard [name]  Procedure(s) Performed: * No procedures listed *  Patient Location: ICU  Anesthesia Type:General  Level of Consciousness: sedated and patient cooperative  Airway & Oxygen Therapy: Patient Spontanous Breathing and Patient connected to nasal cannula oxygen  Post-op Assessment: Report given to RN, Post -op Vital signs reviewed and stable and Patient moving all extremities  Post vital signs: Reviewed and stable  Last Vitals:  Vitals:   08/23/15 0800 08/23/15 0900  BP: 107/80 117/82  Pulse: (!) 167 (!) 167  Resp: 11 20  Temp:      Last Pain:  Vitals:   08/23/15 0800  TempSrc:   PainSc: 0-No pain         Complications: No apparent anesthesia complications

## 2015-08-23 NOTE — Interval H&P Note (Signed)
History and Physical Interval Note:  08/23/2015 2:38 PM  Leonard [name]  has presented today for surgery, with the diagnosis of v tach  The various methods of treatment have been discussed with the patient and family. After consideration of risks, benefits and other options for treatment, the patient has consented to  Procedure(s): CARDIOVERSION (N/A) as a surgical intervention .  The patient's history has been reviewed, patient examined, no change in status, stable for surgery.  I have reviewed the patient's chart and labs.  Questions were answered to the patient's satisfaction.     Virl Axe  Post dated

## 2015-08-24 LAB — PROTIME-INR
INR: 1.12
Prothrombin Time: 14.4 seconds (ref 11.4–15.2)

## 2015-08-24 NOTE — Progress Notes (Signed)
SUBJECTIVE: The patient is doing well today.  At this time, he denies chest pain, shortness of breath, or any new concerns.  Slept well overnight without issues.  [name] aspirin EC  81 mg Oral Daily  . atorvastatin  80 mg Oral q1800  . cholecalciferol  2,000 Units Oral Daily  . irbesartan  150 mg Oral Daily  . metoprolol succinate  25 mg Oral Daily  . multivitamin with minerals  1 tablet Oral Daily  . omega-3 acid ethyl esters  1 g Oral Daily  . sodium chloride flush  3 mL Intravenous Q12H  . ticagrelor  90 mg Oral BID  . venlafaxine  75 mg Oral Daily      OBJECTIVE: Physical Exam: Vitals:   08/23/15 2350 08/24/15 0000 08/24/15 0400 08/24/15 0750  BP: 137/88  135/80 (!) 127/92  Pulse: 81 72 95 84  Resp: 18 14 19 19   Temp: 98 F (36.7 C)  98.6 F (37 C) 98 F (36.7 C)  TempSrc: Oral  Oral Oral  SpO2: 97% 98% 95% 98%  Weight:   (!) 302 lb 11.1 oz (137.3 kg)   Height:        Intake/Output Summary (Last 24 hours) at 08/24/15 0752 Last data filed at 08/24/15 0500  Gross per 24 hour  Intake            271.7 ml  Output             1150 ml  Net           -878.3 ml    Telemetry reveals sinus rhythm  GEN- The patient is well appearing, alert and oriented x 3 today.   Head- normocephalic, atraumatic Eyes-  Sclera clear, conjunctiva pink Ears- hearing intact Oropharynx- clear Neck- supple, no JVP Lymph- no cervical lymphadenopathy Lungs- Clear to ausculation bilaterally, normal work of breathing Heart- Regular rate and rhythm, no murmurs, rubs or gallops, PMI not laterally displaced GI- soft, NT, ND, + BS Extremities- no clubbing, cyanosis, or edema Skin- no rash or lesion Psych- euthymic mood, full affect Neuro- strength and sensation are intact  LABS: Basic Metabolic Panel:  Recent Labs  08/22/15 1130 08/23/15 0330  NA 134* 136  K 4.5 3.9  CL 105 101  CO2 18* 23  GLUCOSE 152* 133*  BUN 30* 30*  CREATININE 1.43* 1.35*  CALCIUM 9.2 8.8*  MG 1.8  --     Liver Function Tests:  Recent Labs  08/22/15 1130  AST 46*  ALT 56  ALKPHOS 93  BILITOT 0.5  PROT 6.7  ALBUMIN 3.4*   No results for input(s): LIPASE, AMYLASE in the last 72 hours. CBC:  Recent Labs  08/22/15 1130 08/23/15 0330  WBC 14.3* 15.4*  NEUTROABS 10.6*  --   HGB 16.0 14.6  HCT 47.8 45.6  MCV 98.8 99.1  PLT 423* 385   Cardiac Enzymes:  Recent Labs  08/22/15 1951 08/22/15 2329 08/23/15 0330  TROPONINI 1.65* 1.61* 1.37*   BNP: Invalid input(s): POCBNP D-Dimer: No results for input(s): DDIMER in the last 72 hours. Hemoglobin A1C: No results for input(s): HGBA1C in the last 72 hours. Fasting Lipid Panel: No results for input(s): CHOL, HDL, LDLCALC, TRIG, CHOLHDL, LDLDIRECT in the last 72 hours. Thyroid Function Tests: No results for input(s): TSH, T4TOTAL, T3FREE, THYROIDAB in the last 72 hours.  Invalid input(s): FREET3 Anemia Panel: No results for input(s): VITAMINB12, FOLATE, FERRITIN, TIBC, IRON, RETICCTPCT in the last 72 hours.  RADIOLOGY: Dg Chest 2  View  Result Date: 08/14/2015 CLINICAL DATA:  Recent myocardial infarct. EXAM: CHEST  2 VIEW COMPARISON:  None. FINDINGS: There is atelectatic change in the posterior right base. Lungs elsewhere clear. Heart is borderline enlarged with pulmonary vascular within normal limits. There is atherosclerotic calcification in the aortic arch. There is degenerative change in the thoracic spine. IMPRESSION: Right base atelectasis. No edema or consolidation. Borderline cardiomegaly. Aortic atherosclerosis. Electronically Signed   By: Lowella Grip III M.D.   On: 08/14/2015 07:42  Dg Chest Portable 1 View  Result Date: 08/22/2015 CLINICAL DATA:  Tachycardia with irregular heartbeat. EXAM: PORTABLE CHEST 1 VIEW COMPARISON:  08/14/2015. FINDINGS: Cardiomegaly. No active infiltrates or failure. RIGHT base atelectasis is improved. No effusion or pneumothorax. IMPRESSION: Cardiomegaly.  No active disease.  Improved  aeration. Electronically Signed   By: Staci Righter M.D.   On: 08/22/2015 13:31    ASSESSMENT AND PLAN:   1. Wide complex tachycardia With recent inferior MI, concern is that this is ventricular tachycardia. He has failed to terminate or slow with Cardizem, Amiodarone, adenosine.  Cardioversion performed yesterday with return to sinus rhythm.  Almin Livingstone plan EPS on Monday to evaluate further. If VT induced at that time, recommendation would be ICD for secondary prevention  2.  CAD/recent inferior STEMI/ischemic cardiomyopathy  Pt with no recent ischemic symptoms Continue medical therapy Plan for cath prior to EP study as appears VT came from possible inferior scar.  3.  Morbid obesity Body mass index is 39.54 kg/m. Weight loss encouarged  4.  Snoring/daytime somnolence He has the body habitus for sleep apnea and his wife describes sleep disordered breathing Would recommend outpatient sleep study   5.  Chronic systolic heart failure Currently euvolemic Continue current therapy   Estell Puccini Meredith Leeds, MD 08/24/2015 7:52 AM

## 2015-08-25 DIAGNOSIS — I5022 Chronic systolic (congestive) heart failure: Secondary | ICD-10-CM

## 2015-08-25 DIAGNOSIS — G4733 Obstructive sleep apnea (adult) (pediatric): Secondary | ICD-10-CM

## 2015-08-25 DIAGNOSIS — I519 Heart disease, unspecified: Secondary | ICD-10-CM

## 2015-08-25 DIAGNOSIS — I25118 Atherosclerotic heart disease of native coronary artery with other forms of angina pectoris: Secondary | ICD-10-CM

## 2015-08-25 LAB — CBC
HCT: 46.2 % (ref 39.0–52.0)
HEMOGLOBIN: 15 g/dL (ref 13.0–17.0)
MCH: 32.3 pg (ref 26.0–34.0)
MCHC: 32.5 g/dL (ref 30.0–36.0)
MCV: 99.6 fL (ref 78.0–100.0)
PLATELETS: 303 10*3/uL (ref 150–400)
RBC: 4.64 MIL/uL (ref 4.22–5.81)
RDW: 13.9 % (ref 11.5–15.5)
WBC: 11.9 10*3/uL — AB (ref 4.0–10.5)

## 2015-08-25 LAB — PROTIME-INR
INR: 1.11
PROTHROMBIN TIME: 14.3 s (ref 11.4–15.2)

## 2015-08-25 NOTE — Progress Notes (Signed)
SUBJECTIVE: Denies chest pain, SOB, palpitations, and leg swelling. He has several questions about upcoming procedures.   ROS: Other than pertinent positives in "Subjective", all others were reviewed and found to be negative.   Intake/Output Summary (Last 24 hours) at 08/25/15 1239 Last data filed at 08/25/15 0817  Gross per 24 hour  Intake              720 ml  Output                0 ml  Net              720 ml    Current Facility-Administered Medications  Medication Dose Route Frequency Provider Last Rate Last Dose  . 0.9 %  sodium chloride infusion  250 mL Intravenous PRN Rogelia Mire, NP      . acetaminophen (TYLENOL) tablet 650 mg  650 mg Oral Q4H PRN Rogelia Mire, NP      . aspirin EC tablet 81 mg  81 mg Oral Daily Rogelia Mire, NP   81 mg at 08/25/15 0914  . atorvastatin (LIPITOR) tablet 80 mg  80 mg Oral q1800 Rogelia Mire, NP   80 mg at 08/24/15 1807  . cholecalciferol (VITAMIN D) tablet 2,000 Units  2,000 Units Oral Daily Rogelia Mire, NP   2,000 Units at 08/25/15 0914  . irbesartan (AVAPRO) tablet 150 mg  150 mg Oral Daily Rogelia Mire, NP   150 mg at 08/25/15 0914  . metoprolol succinate (TOPROL-XL) 24 hr tablet 25 mg  25 mg Oral Daily Rogelia Mire, NP   25 mg at 08/25/15 0914  . multivitamin with minerals tablet 1 tablet  1 tablet Oral Daily Rogelia Mire, NP   1 tablet at 08/25/15 0914  . nitroGLYCERIN (NITROSTAT) SL tablet 0.4 mg  0.4 mg Sublingual Q5 min PRN Rogelia Mire, NP      . omega-3 acid ethyl esters (LOVAZA) capsule 1 g  1 g Oral Daily Leonie Man, MD   1 g at 08/25/15 0914  . ondansetron (ZOFRAN) injection 4 mg  4 mg Intravenous Q6H PRN Rogelia Mire, NP      . sodium chloride flush (NS) 0.9 % injection 3 mL  3 mL Intravenous Q12H Rogelia Mire, NP   3 mL at 08/24/15 2104  . sodium chloride flush (NS) 0.9 % injection 3 mL  3 mL Intravenous PRN Rogelia Mire, NP      .  ticagrelor [facility]) tablet 90 mg  90 mg Oral BID Rogelia Mire, NP   90 mg at 08/25/15 0914  . venlafaxine (EFFEXOR) tablet 75 mg  75 mg Oral Daily Rogelia Mire, NP   75 mg at 08/25/15 0914    Vitals:   08/25/15 0400 08/25/15 0415 08/25/15 0817 08/25/15 1100  BP:  132/84 129/87 115/82  Pulse:   68 68  Resp:   20 19  Temp:  98 F (36.7 C) 98 F (36.7 C) 97.8 F (36.6 C)  TempSrc:  Oral Oral Oral  SpO2:  98% 96% 97%  Weight: (!) 301 lb 11.2 oz (136.9 kg)     Height:        PHYSICAL EXAM General: NAD HEENT: Normal. Neck: No JVD, no thyromegaly.  Lungs: Clear to auscultation bilaterally with normal respiratory effort. CV: Nondisplaced PMI.  Regular rate and rhythm, normal S1/S2, no S3/S4, no murmur.  No pretibial  edema.  Abdomen: Obese.  Neurologic: Alert and oriented x 3.  Psych: Normal affect. Musculoskeletal: No gross deformities. Extremities: No clubbing or cyanosis.   TELEMETRY: Reviewed telemetry pt in NSR  LABS: Basic Metabolic Panel:  Recent Labs  08/23/15 0330  NA 136  K 3.9  CL 101  CO2 23  GLUCOSE 133*  BUN 30*  CREATININE 1.35*  CALCIUM 8.8*   Liver Function Tests: No results for input(s): AST, ALT, ALKPHOS, BILITOT, PROT, ALBUMIN in the last 72 hours. No results for input(s): LIPASE, AMYLASE in the last 72 hours. CBC:  Recent Labs  08/23/15 0330 08/25/15 0554  WBC 15.4* 11.9*  HGB 14.6 15.0  HCT 45.6 46.2  MCV 99.1 99.6  PLT 385 303   Cardiac Enzymes:  Recent Labs  08/22/15 1951 08/22/15 2329 08/23/15 0330  TROPONINI 1.65* 1.61* 1.37*   BNP: Invalid input(s): POCBNP D-Dimer: No results for input(s): DDIMER in the last 72 hours. Hemoglobin A1C: No results for input(s): HGBA1C in the last 72 hours. Fasting Lipid Panel: No results for input(s): CHOL, HDL, LDLCALC, TRIG, CHOLHDL, LDLDIRECT in the last 72 hours. Thyroid Function Tests: No results for input(s): TSH, T4TOTAL, T3FREE, THYROIDAB in the last 72  hours.  Invalid input(s): FREET3 Anemia Panel: No results for input(s): VITAMINB12, FOLATE, FERRITIN, TIBC, IRON, RETICCTPCT in the last 72 hours.  RADIOLOGY: Dg Chest 2 View  Result Date: 08/14/2015 CLINICAL DATA:  Recent myocardial infarct. EXAM: CHEST  2 VIEW COMPARISON:  None. FINDINGS: There is atelectatic change in the posterior right base. Lungs elsewhere clear. Heart is borderline enlarged with pulmonary vascular within normal limits. There is atherosclerotic calcification in the aortic arch. There is degenerative change in the thoracic spine. IMPRESSION: Right base atelectasis. No edema or consolidation. Borderline cardiomegaly. Aortic atherosclerosis. Electronically Signed   By: Lowella Grip III M.D.   On: 08/14/2015 07:42  Dg Chest Portable 1 View  Result Date: 08/22/2015 CLINICAL DATA:  Tachycardia with irregular heartbeat. EXAM: PORTABLE CHEST 1 VIEW COMPARISON:  08/14/2015. FINDINGS: Cardiomegaly. No active infiltrates or failure. RIGHT base atelectasis is improved. No effusion or pneumothorax. IMPRESSION: Cardiomegaly.  No active disease.  Improved aeration. Electronically Signed   By: Staci Righter M.D.   On: 08/22/2015 13:31      ASSESSMENT AND PLAN:  1. Wide complex tachycardia With recent inferior MI, concern is that this is ventricular tachycardia. He had failed to terminate or slow with Cardizem, amiodarone, adenosine and ultimately underwent DC cardioversion performed 8/4 with return to sinus rhythm.  Will plan EPS on Monday to evaluate further. If VT induced at that time, recommendation would be ICD for secondary prevention  2. CAD/recent inferior STEMI/ischemic cardiomyopathy  Pt with no recent ischemic symptoms Continue medical therapy Plan for cath prior to EP study as appears VT came from possible inferior scar.  3. Morbid obesity Body mass index is 39.54 kg/m. Weight loss encouarged  4. Snoring/daytime somnolence He has the body habitus for  sleep apnea and his wife describes sleep disordered breathing Would recommend outpatient sleep study   5. Chronic systolic heart failure Currently euvolemic Continue current therapy   Time spent: 35 minutes.  Greater than 50% was spent in counseling (XXXX) and coordination of care with the patient.  Kate Sable, M.D., F.A.C.C.

## 2015-08-26 ENCOUNTER — Encounter (HOSPITAL_COMMUNITY): Payer: Self-pay | Admitting: Internal Medicine

## 2015-08-26 ENCOUNTER — Encounter (HOSPITAL_COMMUNITY)
Admission: EM | Disposition: A | Discharge status: Home / Self Care | Payer: Self-pay | Source: Home / Self Care | Attending: Cardiology

## 2015-08-26 DIAGNOSIS — I472 Ventricular tachycardia: Secondary | ICD-10-CM

## 2015-08-26 HISTORY — PX: EP IMPLANTABLE DEVICE: SHX172B

## 2015-08-26 HISTORY — PX: ELECTROPHYSIOLOGIC STUDY: SHX172A

## 2015-08-26 HISTORY — PX: CARDIAC CATHETERIZATION: SHX172

## 2015-08-26 LAB — PROTIME-INR
INR: 1.06
Prothrombin Time: 13.8 seconds (ref 11.4–15.2)

## 2015-08-26 SURGERY — LEFT HEART CATH AND CORONARY ANGIOGRAPHY
Anesthesia: LOCAL

## 2015-08-26 SURGERY — ELECTROPHYSIOLOGY STUDY
Anesthesia: LOCAL

## 2015-08-26 MED ORDER — DEXTROSE 5 % IV SOLN
3.0000 g | INTRAVENOUS | Status: DC
  Administered 2015-08-26: 3 g via INTRAVENOUS
  Filled 2015-08-26: qty 3000

## 2015-08-26 MED ORDER — MIDAZOLAM HCL 5 MG/5ML IJ SOLN
INTRAMUSCULAR | Status: DC | PRN
  Administered 2015-08-26 (×3): 1 mg via INTRAVENOUS
  Administered 2015-08-26: 2 mg via INTRAVENOUS
  Administered 2015-08-26 (×2): 1 mg via INTRAVENOUS

## 2015-08-26 MED ORDER — SODIUM CHLORIDE 0.9 % IV SOLN
INTRAVENOUS | Status: DC
Start: 2015-08-26 — End: 2015-08-26

## 2015-08-26 MED ORDER — IOPAMIDOL (ISOVUE-370) INJECTION 76%
INTRAVENOUS | Status: DC | PRN
  Administered 2015-08-26: 85 mL via INTRA_ARTERIAL

## 2015-08-26 MED ORDER — MIDAZOLAM HCL 5 MG/5ML IJ SOLN
INTRAMUSCULAR | Status: AC
  Filled 2015-08-26: qty 5

## 2015-08-26 MED ORDER — SODIUM CHLORIDE 0.9 % IR SOLN
80.0000 mg | Status: DC
Start: 2015-08-26 — End: 2015-08-26
  Administered 2015-08-26: 80 mg
  Filled 2015-08-26: qty 2

## 2015-08-26 MED ORDER — SODIUM CHLORIDE 0.9 % IV SOLN: INTRAVENOUS | Status: DC

## 2015-08-26 MED ORDER — FENTANYL CITRATE (PF) 100 MCG/2ML IJ SOLN
INTRAMUSCULAR | Status: DC | PRN
  Administered 2015-08-26 (×3): 25 ug via INTRAVENOUS
  Administered 2015-08-26: 50 ug via INTRAVENOUS
  Administered 2015-08-26 (×2): 25 ug via INTRAVENOUS

## 2015-08-26 MED ORDER — SODIUM CHLORIDE 0.9 % WEIGHT BASED INFUSION
1.0000 mL/kg/h | INTRAVENOUS | Status: AC
  Administered 2015-08-26: 1 mL/kg/h via INTRAVENOUS

## 2015-08-26 MED ORDER — FENTANYL CITRATE (PF) 100 MCG/2ML IJ SOLN
INTRAMUSCULAR | Status: AC
  Filled 2015-08-26: qty 2

## 2015-08-26 MED ORDER — CHLORHEXIDINE GLUCONATE 4 % EX LIQD
CUTANEOUS | Status: AC
  Filled 2015-08-26: qty 15

## 2015-08-26 MED ORDER — SODIUM CHLORIDE 0.9% FLUSH: 3.0000 mL | INTRAVENOUS | Status: DC | PRN

## 2015-08-26 MED ORDER — CHLORHEXIDINE GLUCONATE 4 % EX LIQD: 60.0000 mL | Freq: Once | CUTANEOUS | Status: DC

## 2015-08-26 MED ORDER — HEPARIN (PORCINE) IN NACL 2-0.9 UNIT/ML-% IJ SOLN
INTRAMUSCULAR | Status: DC | PRN
  Administered 2015-08-26: 10 mL via INTRA_ARTERIAL

## 2015-08-26 MED ORDER — LIDOCAINE HCL (PF) 1 % IJ SOLN
INTRAMUSCULAR | Status: AC
  Filled 2015-08-26: qty 30

## 2015-08-26 MED ORDER — SODIUM CHLORIDE 0.9 % IV SOLN: 250.0000 mL | INTRAVENOUS | Status: DC | PRN

## 2015-08-26 MED ORDER — HEPARIN (PORCINE) IN NACL 2-0.9 UNIT/ML-% IJ SOLN
INTRAMUSCULAR | Status: AC
  Filled 2015-08-26: qty 1000

## 2015-08-26 MED ORDER — HEPARIN (PORCINE) IN NACL 2-0.9 UNIT/ML-% IJ SOLN
INTRAMUSCULAR | Status: DC | PRN
  Administered 2015-08-26: 1000 mL

## 2015-08-26 MED ORDER — LIDOCAINE HCL (PF) 1 % IJ SOLN
INTRAMUSCULAR | Status: DC | PRN
  Administered 2015-08-26: 35 mL via INTRADERMAL

## 2015-08-26 MED ORDER — YOU HAVE A PACEMAKER BOOK
Freq: Once | Status: AC
  Administered 2015-08-26: 21:00:00
  Filled 2015-08-26: qty 1

## 2015-08-26 MED ORDER — HEPARIN SODIUM (PORCINE) 1000 UNIT/ML IJ SOLN
INTRAMUSCULAR | Status: DC | PRN
  Administered 2015-08-26: 6000 [IU] via INTRAVENOUS

## 2015-08-26 MED ORDER — HEPARIN (PORCINE) IN NACL 2-0.9 UNIT/ML-% IJ SOLN
INTRAMUSCULAR | Status: AC
  Filled 2015-08-26: qty 500

## 2015-08-26 MED ORDER — DIAZEPAM 5 MG PO TABS
ORAL_TABLET | ORAL | Status: AC
  Administered 2015-08-26: 10 mg
  Filled 2015-08-26: qty 2

## 2015-08-26 MED ORDER — MIDAZOLAM HCL 2 MG/2ML IJ SOLN
INTRAMUSCULAR | Status: DC | PRN
  Administered 2015-08-26: 1 mg via INTRAVENOUS

## 2015-08-26 MED ORDER — HEPARIN (PORCINE) IN NACL 2-0.9 UNIT/ML-% IJ SOLN
INTRAMUSCULAR | Status: DC | PRN
  Administered 2015-08-26: 15:00:00

## 2015-08-26 MED ORDER — SODIUM CHLORIDE 0.9% FLUSH: 3.0000 mL | Freq: Two times a day (BID) | INTRAVENOUS | Status: DC

## 2015-08-26 MED ORDER — SODIUM CHLORIDE 0.9 % WEIGHT BASED INFUSION
3.0000 mL/kg/h | INTRAVENOUS | Status: DC
  Administered 2015-08-26: 3 mL/kg/h via INTRAVENOUS

## 2015-08-26 MED ORDER — ASPIRIN 81 MG PO CHEW
81.0000 mg | CHEWABLE_TABLET | ORAL | Status: AC
  Administered 2015-08-26: 81 mg via ORAL
  Filled 2015-08-26: qty 1

## 2015-08-26 MED ORDER — IOPAMIDOL (ISOVUE-370) INJECTION 76%
INTRAVENOUS | Status: AC
  Filled 2015-08-26: qty 100

## 2015-08-26 MED ORDER — CHLORHEXIDINE GLUCONATE 4 % EX LIQD: Freq: Once | CUTANEOUS | Status: DC

## 2015-08-26 MED ORDER — BUPIVACAINE HCL (PF) 0.25 % IJ SOLN
INTRAMUSCULAR | Status: DC | PRN
  Administered 2015-08-26: 35 mL

## 2015-08-26 MED ORDER — OFF THE BEAT BOOK
Freq: Once | Status: AC
  Administered 2015-08-26: 21:00:00
  Filled 2015-08-26: qty 1

## 2015-08-26 MED ORDER — LIDOCAINE HCL (PF) 1 % IJ SOLN
INTRAMUSCULAR | Status: DC | PRN
  Administered 2015-08-26: 3 mL via INTRADERMAL

## 2015-08-26 MED ORDER — OXYCODONE-ACETAMINOPHEN 5-325 MG PO TABS: 1.0000 | ORAL_TABLET | ORAL | Status: DC | PRN

## 2015-08-26 MED ORDER — FENTANYL CITRATE (PF) 100 MCG/2ML IJ SOLN
INTRAMUSCULAR | Status: DC | PRN
  Administered 2015-08-26: 50 ug via INTRAVENOUS

## 2015-08-26 MED ORDER — SODIUM CHLORIDE 0.9% FLUSH
3.0000 mL | Freq: Two times a day (BID) | INTRAVENOUS | Status: DC
  Administered 2015-08-26: 3 mL via INTRAVENOUS

## 2015-08-26 MED ORDER — CEFAZOLIN SODIUM-DEXTROSE 2-4 GM/100ML-% IV SOLN
2.0000 g | Freq: Three times a day (TID) | INTRAVENOUS | Status: AC
  Administered 2015-08-26 – 2015-08-27 (×3): 2 g via INTRAVENOUS
  Filled 2015-08-26 (×3): qty 100

## 2015-08-26 MED ORDER — LIDOCAINE HCL (PF) 1 % IJ SOLN
INTRAMUSCULAR | Status: AC
  Filled 2015-08-26: qty 60

## 2015-08-26 MED ORDER — HEPARIN (PORCINE) IN NACL 2-0.9 UNIT/ML-% IJ SOLN
INTRAMUSCULAR | Status: DC | PRN
Start: 2015-08-26 — End: 2015-08-26
  Administered 2015-08-26: 16:00:00

## 2015-08-26 MED ORDER — BUPIVACAINE HCL (PF) 0.25 % IJ SOLN
INTRAMUSCULAR | Status: AC
  Filled 2015-08-26: qty 60

## 2015-08-26 MED ORDER — HEPARIN SODIUM (PORCINE) 1000 UNIT/ML IJ SOLN
INTRAMUSCULAR | Status: AC
  Filled 2015-08-26: qty 1

## 2015-08-26 MED ORDER — VERAPAMIL HCL 2.5 MG/ML IV SOLN
INTRAVENOUS | Status: AC
  Filled 2015-08-26: qty 2

## 2015-08-26 MED ORDER — SODIUM CHLORIDE 0.9 % WEIGHT BASED INFUSION: 1.0000 mL/kg/h | INTRAVENOUS | Status: DC

## 2015-08-26 MED ORDER — MIDAZOLAM HCL 2 MG/2ML IJ SOLN
INTRAMUSCULAR | Status: AC
  Filled 2015-08-26: qty 2

## 2015-08-26 SURGICAL SUPPLY — 13 items
BAG SNAP BAND KOVER 36X36 (MISCELLANEOUS) ×2 IMPLANT
CABLE SURGICAL S-101-97-12 (CABLE) ×1 IMPLANT
CATH QUAD COURNAND 5FR (CATHETERS) ×1 IMPLANT
CATH QUAD JOSEPHSON 5FR (CATHETERS) ×1 IMPLANT
HEMOSTAT SURGICEL 2X4 FIBR (HEMOSTASIS) ×1 IMPLANT
ICD ELLIPSE VR CD1411-36C (ICD Generator) ×1 IMPLANT
LEAD OPTISURE DF1 LDA210-65 (Lead) ×1 IMPLANT
PACK EP LATEX FREE (CUSTOM PROCEDURE TRAY) ×2
PACK EP LF (CUSTOM PROCEDURE TRAY) IMPLANT
PAD DEFIB LIFELINK (PAD) ×1 IMPLANT
SHEATH CLASSIC 8F (SHEATH) ×1 IMPLANT
SHEATH PINNACLE 6F 10CM (SHEATH) ×2 IMPLANT
TRAY PACEMAKER INSERTION (CUSTOM PROCEDURE TRAY) ×1 IMPLANT

## 2015-08-26 SURGICAL SUPPLY — 12 items
CATH INFINITI 5 FR JL3.5 (CATHETERS) ×1 IMPLANT
CATH INFINITI 5FR ANG PIGTAIL (CATHETERS) ×1 IMPLANT
CATH INFINITI JR4 5F (CATHETERS) ×1 IMPLANT
DEVICE RAD COMP TR BAND LRG (VASCULAR PRODUCTS) ×1 IMPLANT
GLIDESHEATH SLEND A-KIT 6F 22G (SHEATH) ×1 IMPLANT
HOVERMATT SINGLE USE (MISCELLANEOUS) ×1 IMPLANT
KIT HEART LEFT (KITS) ×2 IMPLANT
PACK CARDIAC CATHETERIZATION (CUSTOM PROCEDURE TRAY) ×2 IMPLANT
SYR MEDRAD MARK V 150ML (SYRINGE) ×1 IMPLANT
TRANSDUCER W/STOPCOCK (MISCELLANEOUS) ×2 IMPLANT
TUBING CIL FLEX 10 FLL-RA (TUBING) ×2 IMPLANT
WIRE SAFE-T 1.5MM-J .035X260CM (WIRE) ×1 IMPLANT

## 2015-08-26 NOTE — H&P (View-Only) (Signed)
SUBJECTIVE: Denies chest pain, SOB, palpitations, and leg swelling. He has several questions about upcoming procedures.   ROS: Other than pertinent positives in "Subjective", all others were reviewed and found to be negative.   Intake/Output Summary (Last 24 hours) at 08/25/15 1239 Last data filed at 08/25/15 0817  Gross per 24 hour  Intake              720 ml  Output                0 ml  Net              720 ml    Current Facility-Administered Medications  Medication Dose Route Frequency Provider Last Rate Last Dose  . 0.9 %  sodium chloride infusion  250 mL Intravenous PRN Rogelia Mire, NP      . acetaminophen (TYLENOL) tablet 650 mg  650 mg Oral Q4H PRN Rogelia Mire, NP      . aspirin EC tablet 81 mg  81 mg Oral Daily Rogelia Mire, NP   81 mg at 08/25/15 0914  . atorvastatin (LIPITOR) tablet 80 mg  80 mg Oral q1800 Rogelia Mire, NP   80 mg at 08/24/15 1807  . cholecalciferol (VITAMIN D) tablet 2,000 Units  2,000 Units Oral Daily Rogelia Mire, NP   2,000 Units at 08/25/15 0914  . irbesartan (AVAPRO) tablet 150 mg  150 mg Oral Daily Rogelia Mire, NP   150 mg at 08/25/15 0914  . metoprolol succinate (TOPROL-XL) 24 hr tablet 25 mg  25 mg Oral Daily Rogelia Mire, NP   25 mg at 08/25/15 0914  . multivitamin with minerals tablet 1 tablet  1 tablet Oral Daily Rogelia Mire, NP   1 tablet at 08/25/15 0914  . nitroGLYCERIN (NITROSTAT) SL tablet 0.4 mg  0.4 mg Sublingual Q5 min PRN Rogelia Mire, NP      . omega-3 acid ethyl esters (LOVAZA) capsule 1 g  1 g Oral Daily Leonie Man, MD   1 g at 08/25/15 0914  . ondansetron (ZOFRAN) injection 4 mg  4 mg Intravenous Q6H PRN Rogelia Mire, NP      . sodium chloride flush (NS) 0.9 % injection 3 mL  3 mL Intravenous Q12H Rogelia Mire, NP   3 mL at 08/24/15 2104  . sodium chloride flush (NS) 0.9 % injection 3 mL  3 mL Intravenous PRN Rogelia Mire, NP      .  ticagrelor [facility]) tablet 90 mg  90 mg Oral BID Rogelia Mire, NP   90 mg at 08/25/15 0914  . venlafaxine (EFFEXOR) tablet 75 mg  75 mg Oral Daily Rogelia Mire, NP   75 mg at 08/25/15 0914    Vitals:   08/25/15 0400 08/25/15 0415 08/25/15 0817 08/25/15 1100  BP:  132/84 129/87 115/82  Pulse:   68 68  Resp:   20 19  Temp:  98 F (36.7 C) 98 F (36.7 C) 97.8 F (36.6 C)  TempSrc:  Oral Oral Oral  SpO2:  98% 96% 97%  Weight: (!) 301 lb 11.2 oz (136.9 kg)     Height:        PHYSICAL EXAM General: NAD HEENT: Normal. Neck: No JVD, no thyromegaly.  Lungs: Clear to auscultation bilaterally with normal respiratory effort. CV: Nondisplaced PMI.  Regular rate and rhythm, normal S1/S2, no S3/S4, no murmur.  No pretibial  edema.  Abdomen: Obese.  Neurologic: Alert and oriented x 3.  Psych: Normal affect. Musculoskeletal: No gross deformities. Extremities: No clubbing or cyanosis.   TELEMETRY: Reviewed telemetry pt in NSR  LABS: Basic Metabolic Panel:  Recent Labs  08/23/15 0330  NA 136  K 3.9  CL 101  CO2 23  GLUCOSE 133*  BUN 30*  CREATININE 1.35*  CALCIUM 8.8*   Liver Function Tests: No results for input(s): AST, ALT, ALKPHOS, BILITOT, PROT, ALBUMIN in the last 72 hours. No results for input(s): LIPASE, AMYLASE in the last 72 hours. CBC:  Recent Labs  08/23/15 0330 08/25/15 0554  WBC 15.4* 11.9*  HGB 14.6 15.0  HCT 45.6 46.2  MCV 99.1 99.6  PLT 385 303   Cardiac Enzymes:  Recent Labs  08/22/15 1951 08/22/15 2329 08/23/15 0330  TROPONINI 1.65* 1.61* 1.37*   BNP: Invalid input(s): POCBNP D-Dimer: No results for input(s): DDIMER in the last 72 hours. Hemoglobin A1C: No results for input(s): HGBA1C in the last 72 hours. Fasting Lipid Panel: No results for input(s): CHOL, HDL, LDLCALC, TRIG, CHOLHDL, LDLDIRECT in the last 72 hours. Thyroid Function Tests: No results for input(s): TSH, T4TOTAL, T3FREE, THYROIDAB in the last 72  hours.  Invalid input(s): FREET3 Anemia Panel: No results for input(s): VITAMINB12, FOLATE, FERRITIN, TIBC, IRON, RETICCTPCT in the last 72 hours.  RADIOLOGY: Dg Chest 2 View  Result Date: 08/14/2015 CLINICAL DATA:  Recent myocardial infarct. EXAM: CHEST  2 VIEW COMPARISON:  None. FINDINGS: There is atelectatic change in the posterior right base. Lungs elsewhere clear. Heart is borderline enlarged with pulmonary vascular within normal limits. There is atherosclerotic calcification in the aortic arch. There is degenerative change in the thoracic spine. IMPRESSION: Right base atelectasis. No edema or consolidation. Borderline cardiomegaly. Aortic atherosclerosis. Electronically Signed   By: Lowella Grip III M.D.   On: 08/14/2015 07:42  Dg Chest Portable 1 View  Result Date: 08/22/2015 CLINICAL DATA:  Tachycardia with irregular heartbeat. EXAM: PORTABLE CHEST 1 VIEW COMPARISON:  08/14/2015. FINDINGS: Cardiomegaly. No active infiltrates or failure. RIGHT base atelectasis is improved. No effusion or pneumothorax. IMPRESSION: Cardiomegaly.  No active disease.  Improved aeration. Electronically Signed   By: Staci Righter M.D.   On: 08/22/2015 13:31      ASSESSMENT AND PLAN:  1. Wide complex tachycardia With recent inferior MI, concern is that this is ventricular tachycardia. He had failed to terminate or slow with Cardizem, amiodarone, adenosine and ultimately underwent DC cardioversion performed 8/4 with return to sinus rhythm.  Will plan EPS on Monday to evaluate further. If VT induced at that time, recommendation would be ICD for secondary prevention  2. CAD/recent inferior STEMI/ischemic cardiomyopathy  Pt with no recent ischemic symptoms Continue medical therapy Plan for cath prior to EP study as appears VT came from possible inferior scar.  3. Morbid obesity Body mass index is 39.54 kg/m. Weight loss encouarged  4. Snoring/daytime somnolence He has the body habitus for  sleep apnea and his wife describes sleep disordered breathing Would recommend outpatient sleep study   5. Chronic systolic heart failure Currently euvolemic Continue current therapy   Time spent: 35 minutes.  Greater than 50% was spent in counseling (XXXX) and coordination of care with the patient.  Kate Sable, M.D., F.A.C.C.

## 2015-08-26 NOTE — H&P (View-Only) (Signed)
SUBJECTIVE: The patient is doing well today.  At this time, he denies chest pain, shortness of breath, or any new concerns.    [name] aspirin EC  81 mg Oral Daily  . atorvastatin  80 mg Oral q1800  . cholecalciferol  2,000 Units Oral Daily  . irbesartan  150 mg Oral Daily  . metoprolol succinate  25 mg Oral Daily  . multivitamin with minerals  1 tablet Oral Daily  . omega-3 acid ethyl esters  1 g Oral Daily  . sodium chloride flush  3 mL Intravenous Q12H  . sodium chloride flush  3 mL Intravenous Q12H  . ticagrelor  90 mg Oral BID  . venlafaxine  75 mg Oral Daily   . sodium chloride 1 mL/kg/hr (08/26/15 0715)    OBJECTIVE: Physical Exam: Vitals:   08/26/15 0012 08/26/15 0100 08/26/15 0447 08/26/15 0827  BP: (!) 132/93  (!) 139/95 (!) 143/94  Pulse: 62 (!) 59 69 63  Resp:    16  Temp: 98.1 F (36.7 C)  97.5 F (36.4 C) 97.7 F (36.5 C)  TempSrc: Oral  Oral Oral  SpO2: 100% 94% 97% 97%  Weight:   (!) 301 lb 12.8 oz (136.9 kg)   Height:        Intake/Output Summary (Last 24 hours) at 08/26/15 [id number] Last data filed at 08/26/15 0600  Gross per 24 hour  Intake             1250 ml  Output                0 ml  Net             1250 ml    Telemetry reveals sinus rhythm  GEN- The patient is well appearing, alert and oriented x 3 today.   Head- normocephalic, atraumatic Eyes-  Sclera clear, conjunctiva pink Ears- hearing intact Oropharynx- clear Neck- supple, no JVP Lymph- no cervical lymphadenopathy Lungs- Clear to ausculation bilaterally, normal work of breathing Heart- Regular rate and rhythm, no murmurs, rubs or gallops, PMI not laterally displaced GI- soft, NT, ND Extremities- no clubbing, cyanosis, or edema Skin- no rash or lesion Psych- euthymic mood, full affect Neuro- strength and sensation are intact  LABS: Basic Metabolic Panel: No results for input(s): NA, K, CL, CO2, GLUCOSE, BUN, CREATININE, CALCIUM, MG, PHOS in the last 72 hours. Liver Function  Tests: No results for input(s): AST, ALT, ALKPHOS, BILITOT, PROT, ALBUMIN in the last 72 hours. No results for input(s): LIPASE, AMYLASE in the last 72 hours. CBC:  Recent Labs  08/25/15 0554  WBC 11.9*  HGB 15.0  HCT 46.2  MCV 99.6  PLT 303   Cardiac Enzymes: No results for input(s): CKTOTAL, CKMB, CKMBINDEX, TROPONINI in the last 72 hours. BNP: Invalid input(s): POCBNP D-Dimer: No results for input(s): DDIMER in the last 72 hours. Hemoglobin A1C: No results for input(s): HGBA1C in the last 72 hours. Fasting Lipid Panel: No results for input(s): CHOL, HDL, LDLCALC, TRIG, CHOLHDL, LDLDIRECT in the last 72 hours. Thyroid Function Tests: No results for input(s): TSH, T4TOTAL, T3FREE, THYROIDAB in the last 72 hours.  Invalid input(s): FREET3 Anemia Panel: No results for input(s): VITAMINB12, FOLATE, FERRITIN, TIBC, IRON, RETICCTPCT in the last 72 hours.  RADIOLOGY: Dg Chest Portable 1 View Result Date: 08/22/2015 CLINICAL DATA:  Tachycardia with irregular heartbeat. EXAM: PORTABLE CHEST 1 VIEW COMPARISON:  08/14/2015. FINDINGS: Cardiomegaly. No active infiltrates or failure. RIGHT base atelectasis is improved. No effusion or pneumothorax. IMPRESSION:  Cardiomegaly.  No active disease.  Improved aeration. Electronically Signed   By: Staci Righter M.D.   On: 08/22/2015 13:31    08/23/15: Echocardiogram Impressions: - Technically difficult study. Definity contrast administered.   Compared to a prior echo in 07/2015, the LVEF is slightly higher   at 50-55% with mild inferior hypokinesis.    ASSESSMENT AND PLAN:   1. Wide complex tachycardia With recent inferior MI, concern is that this is ventricular tachycardia. He failed to terminate or slow with Cardizem, Amiodarone, adenosine.  Cardioversion this hospitalization sinus rhythm.   Cardiac cath this morning with Dr. Tamala Julian EPS afterwards today to evaluate further. If VT induced at that time, recommendation would be ICD for  secondary prevention Dr. Caryl Comes spoke with the patient and wife at bedside, risk/benefits and alternatives to both procedures have been discussed, the patient wishes to proceed  The patient was made aware [place] law, no driving for 6 months s/p VT event  2.  CAD/recent inferior STEMI/ischemic cardiomyopathy  Pt with no recent ischemic symptoms Continue medical therapy Cath today  3.  Morbid obesity Body mass index is 39.54 kg/m. Weight loss encouarged  4.  Snoring/daytime somnolence He has the body habitus for sleep apnea and his wife describes sleep disordered breathing Would recommend outpatient sleep study, re-discussed today   5.  Chronic systolic heart failure Fluid +, exam is negative follow  Baldwin Jamaica, MD 08/26/2015 9:18 AM

## 2015-08-26 NOTE — Progress Notes (Signed)
SUBJECTIVE: The patient is doing well today.  At this time, he denies chest pain, shortness of breath, or any new concerns.    [name] aspirin EC  81 mg Oral Daily  . atorvastatin  80 mg Oral q1800  . cholecalciferol  2,000 Units Oral Daily  . irbesartan  150 mg Oral Daily  . metoprolol succinate  25 mg Oral Daily  . multivitamin with minerals  1 tablet Oral Daily  . omega-3 acid ethyl esters  1 g Oral Daily  . sodium chloride flush  3 mL Intravenous Q12H  . sodium chloride flush  3 mL Intravenous Q12H  . ticagrelor  90 mg Oral BID  . venlafaxine  75 mg Oral Daily   . sodium chloride 1 mL/kg/hr (08/26/15 0715)    OBJECTIVE: Physical Exam: Vitals:   08/26/15 0012 08/26/15 0100 08/26/15 0447 08/26/15 0827  BP: (!) 132/93  (!) 139/95 (!) 143/94  Pulse: 62 (!) 59 69 63  Resp:    16  Temp: 98.1 F (36.7 C)  97.5 F (36.4 C) 97.7 F (36.5 C)  TempSrc: Oral  Oral Oral  SpO2: 100% 94% 97% 97%  Weight:   (!) 301 lb 12.8 oz (136.9 kg)   Height:        Intake/Output Summary (Last 24 hours) at 08/26/15 [id number] Last data filed at 08/26/15 0600  Gross per 24 hour  Intake             1250 ml  Output                0 ml  Net             1250 ml    Telemetry reveals sinus rhythm  GEN- The patient is well appearing, alert and oriented x 3 today.   Head- normocephalic, atraumatic Eyes-  Sclera clear, conjunctiva pink Ears- hearing intact Oropharynx- clear Neck- supple, no JVP Lymph- no cervical lymphadenopathy Lungs- Clear to ausculation bilaterally, normal work of breathing Heart- Regular rate and rhythm, no murmurs, rubs or gallops, PMI not laterally displaced GI- soft, NT, ND Extremities- no clubbing, cyanosis, or edema Skin- no rash or lesion Psych- euthymic mood, full affect Neuro- strength and sensation are intact  LABS: Basic Metabolic Panel: No results for input(s): NA, K, CL, CO2, GLUCOSE, BUN, CREATININE, CALCIUM, MG, PHOS in the last 72 hours. Liver Function  Tests: No results for input(s): AST, ALT, ALKPHOS, BILITOT, PROT, ALBUMIN in the last 72 hours. No results for input(s): LIPASE, AMYLASE in the last 72 hours. CBC:  Recent Labs  08/25/15 0554  WBC 11.9*  HGB 15.0  HCT 46.2  MCV 99.6  PLT 303   Cardiac Enzymes: No results for input(s): CKTOTAL, CKMB, CKMBINDEX, TROPONINI in the last 72 hours. BNP: Invalid input(s): POCBNP D-Dimer: No results for input(s): DDIMER in the last 72 hours. Hemoglobin A1C: No results for input(s): HGBA1C in the last 72 hours. Fasting Lipid Panel: No results for input(s): CHOL, HDL, LDLCALC, TRIG, CHOLHDL, LDLDIRECT in the last 72 hours. Thyroid Function Tests: No results for input(s): TSH, T4TOTAL, T3FREE, THYROIDAB in the last 72 hours.  Invalid input(s): FREET3 Anemia Panel: No results for input(s): VITAMINB12, FOLATE, FERRITIN, TIBC, IRON, RETICCTPCT in the last 72 hours.  RADIOLOGY: Dg Chest Portable 1 View Result Date: 08/22/2015 CLINICAL DATA:  Tachycardia with irregular heartbeat. EXAM: PORTABLE CHEST 1 VIEW COMPARISON:  08/14/2015. FINDINGS: Cardiomegaly. No active infiltrates or failure. RIGHT base atelectasis is improved. No effusion or pneumothorax. IMPRESSION:  Cardiomegaly.  No active disease.  Improved aeration. Electronically Signed   By: Staci Righter M.D.   On: 08/22/2015 13:31    08/23/15: Echocardiogram Impressions: - Technically difficult study. Definity contrast administered.   Compared to a prior echo in 07/2015, the LVEF is slightly higher   at 50-55% with mild inferior hypokinesis.    ASSESSMENT AND PLAN:   1. Wide complex tachycardia With recent inferior MI, concern is that this is ventricular tachycardia. He failed to terminate or slow with Cardizem, Amiodarone, adenosine.  Cardioversion this hospitalization sinus rhythm.   Cardiac cath this morning with Dr. Tamala Julian EPS afterwards today to evaluate further. If VT induced at that time, recommendation would be ICD for  secondary prevention Dr. Caryl Comes spoke with the patient and wife at bedside, risk/benefits and alternatives to both procedures have been discussed, the patient wishes to proceed  The patient was made aware [place] law, no driving for 6 months s/p VT event  2.  CAD/recent inferior STEMI/ischemic cardiomyopathy  Pt with no recent ischemic symptoms Continue medical therapy Cath today  3.  Morbid obesity Body mass index is 39.54 kg/m. Weight loss encouarged  4.  Snoring/daytime somnolence He has the body habitus for sleep apnea and his wife describes sleep disordered breathing Would recommend outpatient sleep study, re-discussed today   5.  Chronic systolic heart failure Fluid +, exam is negative follow  Baldwin Jamaica, MD 08/26/2015 9:18 AM

## 2015-08-26 NOTE — Progress Notes (Signed)
TR BAND REMOVAL  LOCATION:    right radial  DEFLATED PER PROTOCOL:    Yes.    TIME BAND OFF / DRESSING APPLIED:    1900   SITE UPON ARRIVAL:    Level 0  SITE AFTER BAND REMOVAL:    Level 0  CIRCULATION SENSATION AND MOVEMENT:    Within Normal Limits   Yes.    COMMENTS:   Tolerated procedure well. Post TRB instructions given

## 2015-08-26 NOTE — Interval H&P Note (Signed)
Cath Lab Visit (complete for each Cath Lab visit)  Clinical Evaluation Leading to the Procedure:   ACS: No.  Non-ACS:    Anginal Classification: CCS [name]  Anti-ischemic medical therapy: Maximal Therapy (2 or more classes of medications)  Non-Invasive Test Results: No non-invasive testing performed  Prior CABG: No previous CABG      History and Physical Interval Note:  08/26/2015 9:47 AM  Leonard [name]  has presented today for surgery, with the diagnosis of cp  The various methods of treatment have been discussed with the patient and family. After consideration of risks, benefits and other options for treatment, the patient has consented to  Procedure(s): Left Heart Cath and Coronary Angiography (N/A) as a surgical intervention .  The patient's history has been reviewed, patient examined, no change in status, stable for surgery.  I have reviewed the patient's chart and labs.  Questions were answered to the patient's satisfaction.     Leonard [name] [name]

## 2015-08-26 NOTE — Interval H&P Note (Signed)
ICD Criteria  Current LVEF:35%. Within 12 months prior to implant: Yes   Heart failure history: Yes, Class II  Cardiomyopathy history: Yes, Ischemic Cardiomyopathy.  Atrial Fibrillation/Atrial Flutter: No.  Ventricular tachycardia history: Yes, No hemodynamic instability. VT Type: Sustained Ventricular Tachycardia - Monomorphic.  Cardiac arrest history: No.  History of syndromes with risk of sudden death: No.  Previous ICD: No.  Current ICD indication: Secondary  PPM indication: No.   Class I or II Bradycardia indication present: No  Beta Blocker therapy for 3 or more months: Yes, prescribed.   Ace Inhibitor/ARB therapy for 3 or more months: Yes, prescribed.   History and Physical Interval Note:  08/26/2015 4:45 PM  Leonard [name]  has presented today for surgery, with the diagnosis of right compleex tachycardia  The various methods of treatment have been discussed with the patient and family. After consideration of risks, benefits and other options for treatment, the patient has consented to  Procedure(s): Electrophysiology Study (N/A) ICD Implant (N/A) as a surgical intervention .  The patient's history has been reviewed, patient examined, no change in status, stable for surgery.  I have reviewed the patient's chart and labs.  Questions were answered to the patient's satisfaction.     Virl Axe

## 2015-08-27 ENCOUNTER — Encounter (HOSPITAL_COMMUNITY): Payer: Self-pay | Admitting: Internal Medicine

## 2015-08-27 ENCOUNTER — Other Ambulatory Visit: Payer: Self-pay | Admitting: Physician Assistant

## 2015-08-27 ENCOUNTER — Other Ambulatory Visit: Payer: Self-pay

## 2015-08-27 ENCOUNTER — Inpatient Hospital Stay (HOSPITAL_COMMUNITY): Payer: Medicare Other

## 2015-08-27 DIAGNOSIS — I472 Ventricular tachycardia, unspecified: Secondary | ICD-10-CM

## 2015-08-27 LAB — PROTIME-INR
INR: 1.14
Prothrombin Time: 14.7 seconds (ref 11.4–15.2)

## 2015-08-27 LAB — BASIC METABOLIC PANEL
ANION GAP: 7 (ref 5–15)
BUN: 15 mg/dL (ref 6–20)
CHLORIDE: 101 mmol/L (ref 101–111)
CO2: 25 mmol/L (ref 22–32)
Calcium: 9 mg/dL (ref 8.9–10.3)
Creatinine, Ser: 1.11 mg/dL (ref 0.61–1.24)
GFR calc Af Amer: >60 mL/min (ref >60)
GFR calc non Af Amer: >60 mL/min (ref >60)
GLUCOSE: 117 mg/dL — AB (ref 65–99)
POTASSIUM: 4.2 mmol/L (ref 3.5–5.1)
Sodium: 133 mmol/L — ABNORMAL LOW (ref 135–145)

## 2015-08-27 NOTE — Discharge Summary (Signed)
ELECTROPHYSIOLOGY PROCEDURE DISCHARGE SUMMARY    Patient ID: Leonard [name],  MRN: [id number], DOB/AGE: 1945/12/04 70 y.o.  Admit date: 08/22/2015 Discharge date: 08/27/2015  Primary Care Physician: [name], NP Primary Cardiologist: Dr. Ellyn Hack Electrophysiologist: Dr. Caryl Comes (new)  Primary Discharge Diagnosis:  1. VT  Secondary Discharge Diagnosis:  1. CAD (recent STEMI, PCI to RCA with DES x2 on 08/12/15) 2. HTN 3. HLD 4. ICM (BB/ARB tx)  Allergies  Allergen Reactions  . Other Other (See Comments)    ragweed  . Sulfa Antibiotics Other (See Comments)    Pt unsure     Procedures This Admission:  1.  Implantation of a STM single chamber ICD on 8717 by Dr Caryl Comes.  The patient received a [employer] Jude ICD, serial number [id number] and a model [id number] serial number [id number] right ventricular lead, DFT's were deferred at time of implant.  There were no immediate post procedure complications.  CXR on 08/27/15 demonstrated no pneumothorax status post device implantation.   2. EPS, 08/26/15, Dr. Caryl Comes     Arrhythmia initiated: Yes. Arrhythmia type: monomorphic VT.  Initiation method: ventricular programmed stimulation.  Cycle length: 434ms. 400-600-320-270  3. LHC, 08/26/15, Dr. Tamala Julian  Inferobasal and mid inferior wall akinesis. Elevated end-diastolic pressure is moderately elevated at 24 mmHg. Estimated ejection fraction 35-40%.  Widely patent right coronary stents, recently implanted late July 2017.  Ostial 50-60% LAD  Development of interventricular conduction delay/bundle branch block during the procedure  4. DCCV 08/23/15, Dr. Caryl Comes Pt was sedated by anesthesia receiving   Propafol A synchronized shock *120* joules restored sinus Rhythm  Pt tolerated without difficulty   Brief HPI: Leonard [name] is a 70 y.o. male was at [place] office the day of his admission for routine scheduled follow up and was found to be in wide complex tachycardia at a rate of 166.   He was without chest pain or shortness of breath at that time. He was admitted to the hospital for further evaluation.  He has remained in tachycardia overnight with no change in rate despite Cardizem and amiodarone drips.  He was also given adenosine early the next morning with no change in heart rate. An additional 12mg  of adenosine with again no change in HR but possibly increased VA dissociation and subsequently had DDCV by Dr. Avis Epley Course:  The patient was admitted 08/22/15 for [facility] as discussed in HPI, he has past medical history significant for obesity, hypertension, hyperlipidemia, and recent inferior STEMI s/p DES to RCA. Tachycardia was initially felt to be a AFlutter w/RVR and he was started on a/c.  It became suspect however that this was VT and after [facility] a/c was stopped. Given his hx of recent MI, he underwent LHC by Dr. Tamala Julian 08/26/15 with stable CAD and no need for further interventions, after this he had EP study with easily inducible VT followed by ICD implant, procedure details listed above.  WCT was in fact VT, no need for a/c going forward.  His echo this admission noted EF 50-55%, LVgram by LHC 35-45%.  The patient has remained in SR without recurrent VT.  His exam is euvolemic, he is with c/o CP, or SOB.  He was observed during his ICD implant to desat with sleep and is urged to seek evaluation with his PMD for sleep study, the patient states this is already in progress. He is counseled on is medications, particularly is ASA/Brilinta given recent PCI and DES, wound care  and ROM/activity restrictions discussed.   Given his VT, the patient and wife were made aware of [place] law regarding no driving for 7months, the patient is reminded this morning. Device interrogation this morning noted intact/normal function. The patient was seen and examined this morning by Dr. Caryl Comes and felt stable for discharge.  Follow -up ETT test is scheduled to evaluate the patient's HR with exertion to  better program his device in effort to avoid inappropriate shocks for ST.  Wound check and f/u for Dr. Caryl Comes have been arranged.  I have staff messaged the office to arrange f/u with Dr. Ellyn Hack as well.    Physical Exam: Vitals:   08/26/15 2000 08/26/15 2001 08/27/15 0442 08/27/15 0733  BP: 116/60 116/60 129/81 (!) 145/79  Pulse: 73 75 72 72  Resp: 16 19 13 15   Temp:  97.6 F (36.4 C) 98.1 F (36.7 C) (!) 96.3 F (35.7 C)  TempSrc:  Oral Oral Axillary  SpO2: 94% 95% 95% 94%  Weight:   300 lb (136.1 kg)   Height:         GEN- The patient is well appearing, alert and oriented x 3 today.   HEENT: normocephalic, atraumatic; sclera clear, conjunctiva pink; hearing intact; oropharynx clear Lungs- Clear to ausculation bilaterally, normal work of breathing.  No wheezes, rales, rhonchi Heart- Regular rate and rhythm, no murmurs, rubs or gallops, PMI not laterally displaced GI- soft, non-tender, non-distended, bowel sounds present Extremities- no clubbing, cyanosis, trace if any edema MS- no significant deformity or atrophy Skin- warm and dry, no rash or lesion, left chest without hematoma/ecchymosis Psych- euthymic mood, full affect Neuro- no gross deficits  LHC site/R wrist stable, bounding radial pulses  Labs:   Lab Results  Component Value Date   WBC 11.9 (H) 08/25/2015   HGB 15.0 08/25/2015   HCT 46.2 08/25/2015   MCV 99.6 08/25/2015   PLT 303 08/25/2015    Recent Labs Lab 08/22/15 1130  08/27/15 0554  NA 134*  < > 133*  K 4.5  < > 4.2  CL 105  < > 101  CO2 18*  < > 25  BUN 30*  < > 15  CREATININE 1.43*  < > 1.11  CALCIUM 9.2  < > 9.0  PROT 6.7  --   --   BILITOT 0.5  --   --   ALKPHOS 93  --   --   ALT 56  --   --   AST 46*  --   --   GLUCOSE 152*  < > 117*  < > = values in this interval not displayed.  Discharge Medications:    Medication List    TAKE these medications   amLODipine 5 MG tablet Commonly known as:  NORVASC TAKE 1 TABLET BY MOUTH  DAILY   aspirin EC 81 MG tablet Take 1 tablet (81 mg total) by mouth daily.   atorvastatin 80 MG tablet Commonly known as:  LIPITOR Take 1 tablet (80 mg total) by mouth daily at 6 PM.   cholecalciferol 1000 units tablet Commonly known as:  VITAMIN D Take 2,000 Units by mouth daily.   CoQ10 200 MG Caps Take 200 mg by mouth daily.   Fish Oil 1200 MG Cpdr Take 1 tablet by mouth daily.   irbesartan 150 MG tablet Commonly known as:  AVAPRO Take 1 tablet (150 mg total) by mouth daily.   ketotifen 0.025 % ophthalmic solution Commonly known as:  ZADITOR Place 1 drop  into both eyes as needed (for dry eyes).   metoprolol succinate 25 MG 24 hr tablet Commonly known as:  TOPROL-XL Take 1 tablet (25 mg total) by mouth daily.   multivitamin tablet Take 1 tablet by mouth daily.   nitroGLYCERIN 0.4 MG SL tablet Commonly known as:  NITROSTAT Place 1 tablet (0.4 mg total) under the tongue every 5 (five) minutes as needed for chest pain.   tadalafil 20 MG tablet Commonly known as:  CIALIS Take 20 mg by mouth daily as needed for erectile dysfunction.   ticagrelor 90 MG Tabs tablet Commonly known as:  BRILINTA Take 1 tablet (90 mg total) by mouth 2 (two) times daily.   venlafaxine 75 MG tablet Commonly known as:  EFFEXOR TAKE 1 TABLET BY MOUTH EVERY DAY       Disposition: Home Discharge Instructions    Diet - low sodium heart healthy    Complete by:  As directed   Increase activity slowly    Complete by:  As directed     Follow-up Information    [facility] Follow up on 09/05/2015.   Specialty:  Cardiology Why:  3:00PM, wound check Contact information: [address] [place]       Virl Axe, MD Follow up on 12/06/2015.   Specialty:  Cardiology Why:  2:00PM Contact information: [address] [address] [phone]           Duration of Discharge  Encounter: Greater than 30 minutes including physician time.  Venetia Night, PA-C 08/27/2015 9:09 AM     Patient seen and examined. Instructions given. No driving x 6 months ATP only programmed in lower rate zon3

## 2015-08-27 NOTE — Care Management Note (Signed)
Case Management Note  Patient Details  Name: Govanni Eberspacher MRN: [id number] Date of Birth: 29-Dec-1945  Subjective/Objective:  Patient is s/p left heart cath, for discharge today no needs.                   Action/Plan:   Expected Discharge Date:                  Expected Discharge Plan:  Home/Self Care  In-House Referral:     Discharge planning Services     Post Acute Care Choice:    Choice offered to:     DME Arranged:    DME Agency:     HH Arranged:    HH Agency:     Status of Service:  Completed, signed off  If discussed at [employer] of Stay Meetings, dates discussed:    Additional Comments:  Zenon Mayo, RN 08/27/2015, 12:10 PM

## 2015-08-27 NOTE — Op Note (Signed)
[name], Leonard [name] NO.:  [id number]  MEDICAL RECORD NO.:  [id number]  LOCATION:  [id number]                        FACILITY:  [facility]  PHYSICIAN:  Deboraha Sprang, MD, FACCDATE OF BIRTH:  03/15/1945  DATE OF PROCEDURE:  08/26/2015 DATE OF DISCHARGE:                              OPERATIVE REPORT   PREOPERATIVE DIAGNOSIS:  Wide complex tachycardia.  POSTOPERATIVE DIAGNOSIS:  Ventricular tachycardia.  PROCEDURE:  Invasive electrophysiological study.  DESCRIPTION OF PROCEDURE:  Following obtaining informed consent, the patient was brought to electrophysiology laboratory and placed on the fluoroscopic table in supine position.  After routine prep and drape, cardiac catheterization was performed under local anesthesia and conscious sedation.  Noninvasive blood pressure monitoring, transcutaneous oxygen saturation monitoring were performed continuously throughout the procedure.  Following the procedure, the catheters were removed, and the patient prepared for ICD implantation in stable condition.  Catheters:  A 5-French quadripolar catheter was inserted in the right femoral vein to the right ventricular apex.  A 5-French octapolar catheter was inserted in the right femoral vein to the AV junction __________ moved to the right atrium.  Surface leads I, aVF, and V1 were monitored continuously throughout the procedure.  Following insertion of the catheters, the stimulation protocol included incremental atrial pacing. Incremental ventricular pacing. Programmed stimulation from the ventricle.  RESULTS AND BASELINE MEASUREMENTS:  Rhythm:  Sinus; RR interval 830 millisecond; PR interval 192 millisecond; P-wave duration 125 millisecond; QRS duration 80 millisecond; QT interval 429 milliseconds; AH interval 82 millisecond; HV interval 56 milliseconds.  Final:  Sinus:  RR interval 871 milliseconds; PR interval 186 millisecond; P-wave duration 121 millisecond; QRS duration  70 milliseconds; QTc interval 457 milliseconds.  VA conduction was one-to-one at 600 milliseconds.  However, at 400 milliseconds with ventricular tachycardia, VA dissociation was identified clarifying the mechanism of the wide-complex tachycardia.  Ventricular stimulation was undertaken with programmed extrastimuli from right ventricular apex.  At 400, 600, 320, 250, the patient had recurrent ventricular tachycardia induced on 2 separate occasions terminated with burst pacing.  IMPRESSION: 1. Normal sinus function. 2. Normal atrial function. 3. Normal AV nodal function as assessed. 4. Normal His-Purkinje system function. 5. No accessory pathway. 6. Inducible ventricular tachycardia.  SUMMARY AND CONCLUSION:  Results of electrophysiological testing confirmed ventricular tachycardia as a mechanism of the patient's wide- complex tachycardia.  Given his left ventricular dysfunction and ischemic substrate, he will undergo ICD implantation.     Deboraha Sprang, MD, [facility]     SCK/MEDQ  D:  08/26/2015  T:  08/26/2015  Job:  [id number]

## 2015-08-27 NOTE — Discharge Instructions (Signed)
You are scheduled for an exercise stress test on 09/06/15 at 3:00PM, at [facility] street office (address listed in you appointments) you will be called a day or so ahead by the office with instruction for the test.        Supplemental Discharge Instructions for  Pacemaker/Defibrillator Patients  Activity No heavy lifting or vigorous activity with your left/right arm for 6 to 8 weeks.  Do not raise your left/right arm above your head for one week.  Gradually raise your affected arm as drawn below.           __8/11/17                   08/31/15                      09/01/15                   09/02/15  NO DRIVING for 6 months  WOUND CARE - Keep the wound area clean and dry.  Do not get this area wet for 24 hours. you may shower on 08/27/15 evening   . - The tape/steri-strips on your wound will fall off; do not pull them off.  No bandage is needed on the site.  DO  NOT apply any creams, oils, or ointments to the wound area. - If you notice any drainage or discharge from the wound, any swelling or bruising at the site, or you develop a fever > 101? F after you are discharged home, call the office at once.  Special Instructions - You are still able to use cellular telephones; use the ear opposite the side where you have your pacemaker/defibrillator.  Avoid carrying your cellular phone near your device. - When traveling through airports, show security personnel your identification card to avoid being screened in the metal detectors.  Ask the security personnel to use the hand wand. - Avoid arc welding equipment, MRI testing (magnetic resonance imaging), TENS units (transcutaneous nerve stimulators).  Call the office for questions about other devices. - Avoid electrical appliances that are in poor condition or are not properly grounded. - Microwave ovens are safe to be near or to operate.  Additional information for defibrillator patients should your device go off: - If your device  goes off ONCE and you feel fine afterward, notify the device clinic nurses. - If your device goes off ONCE and you do not feel well afterward, call 911. - If your device goes off TWICE, call 911. - If your device goes off THREE times in one day, call 911.  DO NOT DRIVE YOURSELF OR A FAMILY MEMBER WITH A DEFIBRILLATOR TO THE HOSPITAL--CALL 911.

## 2015-08-28 ENCOUNTER — Telehealth: Payer: Self-pay | Admitting: Cardiology

## 2015-08-28 NOTE — Telephone Encounter (Signed)
[name] inquiring about wound care for groin and wrist incisions s/p EPS/ cardiac cath. I have advised him that he can remove the bandages, wash with soap and water in the shower, pat dry and monitor incisions for redness, swelling, drainage. If he notices bleeding from either incision to apply pressure. Avoid swimming pools and baths for another week, shower only. Call clinic with any of the previously mentioned symptoms. He verbalizes understanding.

## 2015-08-28 NOTE — Telephone Encounter (Signed)
New message    Pt wants to know when he can take his bandages off from the electric study he recently had. Please call.

## 2015-08-28 NOTE — Telephone Encounter (Signed)
Pt had ICD implantation on 8/7 - will route inquiry to device pool for review.

## 2015-08-31 ENCOUNTER — Telehealth: Payer: Self-pay | Admitting: Nurse Practitioner

## 2015-08-31 NOTE — Telephone Encounter (Signed)
   Pt called this AM to report that he had a very small trickle of blood from his ICD incision site noted while using his left arm to put on shaving cream.  This has not recurred and he has not noted any additional swelling beyond that that was already present from the procedure. We discussed what to look for re: swelling/hematoma/bleeding and I also recommended that it would be ok to place a piece of gauze over the site and apply gentle pressure with a piece of tape.  He verbalized understanding and will call back if he notes any additional bleeding/swelling.  He also requested his f/u appts, as these were not communicated to him @ d/c.  I went over these w/ him (device clinic on 8/17, SK & ETT on 8/18, and f/u with me on 8/30).  Caller was grateful for the call back.  Murray Hodgkins, NP 08/31/2015, 10:24 AM

## 2015-09-04 ENCOUNTER — Other Ambulatory Visit: Payer: Self-pay | Admitting: Family Medicine

## 2015-09-05 ENCOUNTER — Encounter: Payer: Self-pay | Admitting: Physician Assistant

## 2015-09-05 ENCOUNTER — Ambulatory Visit: Payer: Medicare Other

## 2015-09-06 ENCOUNTER — Ambulatory Visit (INDEPENDENT_AMBULATORY_CARE_PROVIDER_SITE_OTHER): Payer: Medicare Other

## 2015-09-06 ENCOUNTER — Encounter: Payer: Medicare Other | Admitting: Internal Medicine

## 2015-09-06 ENCOUNTER — Ambulatory Visit (INDEPENDENT_AMBULATORY_CARE_PROVIDER_SITE_OTHER): Payer: Medicare Other | Admitting: *Deleted

## 2015-09-06 DIAGNOSIS — I472 Ventricular tachycardia, unspecified: Secondary | ICD-10-CM

## 2015-09-06 DIAGNOSIS — I5042 Chronic combined systolic (congestive) and diastolic (congestive) heart failure: Secondary | ICD-10-CM

## 2015-09-06 DIAGNOSIS — I255 Ischemic cardiomyopathy: Secondary | ICD-10-CM | POA: Diagnosis not present

## 2015-09-06 LAB — CUP PACEART INCLINIC DEVICE CHECK
Date Time Interrogation Session: 20170818151122
HIGH POWER IMPEDANCE MEASURED VALUE: 60.75 Ohm
Implantable Lead Location: 753860
Lead Channel Impedance Value: 575 Ohm
Lead Channel Pacing Threshold Amplitude: 1 V
Lead Channel Pacing Threshold Pulse Width: 0.5 ms
Lead Channel Sensing Intrinsic Amplitude: 11.6 mV
MDC IDC LEAD IMPLANT DT: 20170807
MDC IDC MSMT BATTERY REMAINING LONGEVITY: 102
MDC IDC MSMT LEADCHNL RV PACING THRESHOLD AMPLITUDE: 1 V
MDC IDC MSMT LEADCHNL RV PACING THRESHOLD PULSEWIDTH: 0.5 ms
MDC IDC SET LEADCHNL RV PACING AMPLITUDE: 3.5 V
MDC IDC SET LEADCHNL RV PACING PULSEWIDTH: 0.5 ms
MDC IDC SET LEADCHNL RV SENSING SENSITIVITY: 0.5 mV
MDC IDC STAT BRADY RV PERCENT PACED: 0 %
Pulse Gen Serial Number: 7377761

## 2015-09-06 LAB — EXERCISE TOLERANCE TEST
CHL CUP MPHR: 148 {beats}/min
CHL CUP RESTING HR STRESS: 82 {beats}/min
Estimated workload: 2.1 METS
Exercise duration (min): 1 min
Exercise duration (sec): 45 s
Peak HR: 118 {beats}/min
Percent HR: 79 %
RPE: 15

## 2015-09-06 NOTE — Progress Notes (Signed)
Wound check appointment. Dermabond removed. Wound without redness or edema. Incision edges approximated, wound well healed. Stitch removed from R corner of incision. SK assessed wound-silver nitrate and antibiotic ointment applied to site. No further interventions needed per SK.   ICD check in clinic. Normal device function. Threshold, sensing, and impedances consistent with implant measurements. Device programmed at 3.5V for extra safety margin until 3 month visit. Histogram distribution appropriate for patient and level of activity. No ventricular arrhythmias noted. Patient educated about wound care, arm mobility, lifting restrictions, shock plan. ROV in 3 months with SK.

## 2015-09-12 ENCOUNTER — Telehealth: Payer: Self-pay | Admitting: Internal Medicine

## 2015-09-12 NOTE — Telephone Encounter (Signed)
New Message   1. Has your device fired? No   2. Is you device beeping? No   3. Are you experiencing draining or swelling at device site? Pt states site is draining with old blood. He states that's what he thinks it is. He says its not a lot but he did not know if this was something to be worried about/. Please call back to discuss   4. Are you calling to see if we received your device transmission? no  5. Have you passed out? no

## 2015-09-12 NOTE — Telephone Encounter (Signed)
Returned patient call regarding incision drainage. Pt. reports a small amount of dark blood draining from the right corner of the incision at the location of the stitch removal from the 8/18 wound check. Pt. reports that it was intermittent at best. Pt reports the sight is free from redness, swelling, additional drainage, is not hot to the touch and that he is free from a fever or chills. He reports that the site is now closed. I instructed the patient to continue to monitor the site, keep it clean and dry and that he is free to shower and wash site with soap and water. Pt instructed to call back if he notices any previously stated signs of infection or if the drainage turns from brown to frank red blood and worsens in amount. Pt verbalized understanding.

## 2015-09-13 ENCOUNTER — Other Ambulatory Visit: Payer: Self-pay | Admitting: Family Medicine

## 2015-09-16 ENCOUNTER — Other Ambulatory Visit: Payer: Self-pay | Admitting: Cardiology

## 2015-09-17 NOTE — Telephone Encounter (Signed)
Rx request sent to pharmacy.  

## 2015-09-18 ENCOUNTER — Ambulatory Visit: Payer: Medicare Other | Admitting: Nurse Practitioner

## 2015-09-18 ENCOUNTER — Other Ambulatory Visit: Payer: Self-pay | Admitting: *Deleted

## 2015-09-18 MED ORDER — METOPROLOL SUCCINATE ER 25 MG PO TB24: 25.0000 mg | ORAL_TABLET | Freq: Every day | ORAL | 6 refills | Status: DC

## 2015-09-24 ENCOUNTER — Encounter: Payer: Self-pay | Admitting: Internal Medicine

## 2015-10-07 ENCOUNTER — Telehealth: Payer: Self-pay | Admitting: *Deleted

## 2015-10-07 ENCOUNTER — Encounter: Payer: Self-pay | Admitting: Internal Medicine

## 2015-10-07 NOTE — Telephone Encounter (Signed)
Spoke to patient regarding ICD shock from 9/8. Patient states that he felt dizzy x 1-2 seconds before receiving a "pop" in the back of his head. He states that he was in the car at the time. He denied any CP or any other sx's and states that he has been taking all of his medications as Rx'd.   Will inform Dr.Klein about episode and call patient with any recommendations.  Patient aware of driving restriction x 6 months.

## 2015-10-08 ENCOUNTER — Telehealth (HOSPITAL_COMMUNITY): Payer: Self-pay | Admitting: *Deleted

## 2015-10-08 ENCOUNTER — Inpatient Hospital Stay (HOSPITAL_COMMUNITY): Admission: RE | Admit: 2015-10-08 | Payer: Medicare Other | Source: Ambulatory Visit

## 2015-10-08 ENCOUNTER — Telehealth: Payer: Self-pay | Admitting: Internal Medicine

## 2015-10-08 DIAGNOSIS — I472 Ventricular tachycardia, unspecified: Secondary | ICD-10-CM

## 2015-10-08 MED ORDER — METOPROLOL SUCCINATE ER 50 MG PO TB24: 50.0000 mg | ORAL_TABLET | Freq: Every day | ORAL | 6 refills | Status: DC

## 2015-10-08 NOTE — Telephone Encounter (Signed)
LMTCB//sss 

## 2015-10-08 NOTE — Telephone Encounter (Signed)
Spoke to patient regarding Dr.Klein's recommendations. I informed patient that Dr.Klein recommended that patient increase his Toprol XL to 50mg  daily and to get a BMP and Mag tomorrow. I told patient that he could take (2) tablets of his current Rx until they run out. Patient voiced understanding of instructions given.

## 2015-10-08 NOTE — Telephone Encounter (Signed)
New Message  Pt voiced he is returning nurses call about his device.  Please f/u with pt

## 2015-10-09 ENCOUNTER — Telehealth: Payer: Self-pay | Admitting: Student

## 2015-10-09 ENCOUNTER — Other Ambulatory Visit: Payer: Medicare Other | Admitting: *Deleted

## 2015-10-09 ENCOUNTER — Encounter (INDEPENDENT_AMBULATORY_CARE_PROVIDER_SITE_OTHER): Payer: Self-pay

## 2015-10-09 DIAGNOSIS — I472 Ventricular tachycardia, unspecified: Secondary | ICD-10-CM

## 2015-10-09 LAB — BASIC METABOLIC PANEL
BUN: 18 mg/dL (ref 7–25)
CHLORIDE: 100 mmol/L (ref 98–110)
CO2: 22 mmol/L (ref 20–31)
Calcium: 9.2 mg/dL (ref 8.6–10.3)
Creat: 1 mg/dL (ref 0.70–1.18)
Glucose, Bld: 113 mg/dL — ABNORMAL HIGH (ref 65–99)
POTASSIUM: 4.5 mmol/L (ref 3.5–5.3)
SODIUM: 135 mmol/L (ref 135–146)

## 2015-10-09 LAB — MAGNESIUM: Magnesium: 1.5 mg/dL (ref 1.5–2.5)

## 2015-10-09 NOTE — Telephone Encounter (Signed)
  Received call from [facility] regarding patient being able to undergo orientation with recent ICD firing on 09/27/2015.  Confirmed with Chanetta Marshall that he can undergo orientation as scheduled. Will monitor HR closely with BB dosing being recently increased.   Signed, Erma Heritage, PA-C 10/09/2015, 12:46 PM Pager: [phone]

## 2015-10-10 ENCOUNTER — Other Ambulatory Visit: Payer: Self-pay

## 2015-10-10 ENCOUNTER — Ambulatory Visit (HOSPITAL_COMMUNITY)
Admission: RE | Admit: 2015-10-10 | Discharge: 2015-10-10 | Disposition: A | Discharge status: Home / Self Care | Payer: BLUE CROSS/BLUE SHIELD | Source: Ambulatory Visit | Attending: Cardiology | Admitting: Cardiology

## 2015-10-10 ENCOUNTER — Telehealth: Payer: Self-pay | Admitting: Nurse Practitioner

## 2015-10-10 ENCOUNTER — Encounter (HOSPITAL_COMMUNITY)
Admission: RE | Admit: 2015-10-10 | Discharge: 2015-10-10 | Disposition: A | Discharge status: Home / Self Care | Payer: Medicare Other | Source: Ambulatory Visit | Attending: Cardiology | Admitting: Cardiology

## 2015-10-10 ENCOUNTER — Telehealth: Payer: Self-pay | Admitting: Cardiology

## 2015-10-10 VITALS — BP 110/78 | HR 99 | Ht 70.75 in | Wt 297.2 lb

## 2015-10-10 DIAGNOSIS — I213 ST elevation (STEMI) myocardial infarction of unspecified site: Secondary | ICD-10-CM | POA: Insufficient documentation

## 2015-10-10 DIAGNOSIS — I491 Atrial premature depolarization: Secondary | ICD-10-CM | POA: Diagnosis not present

## 2015-10-10 DIAGNOSIS — Z955 Presence of coronary angioplasty implant and graft: Secondary | ICD-10-CM | POA: Insufficient documentation

## 2015-10-10 DIAGNOSIS — I472 Ventricular tachycardia, unspecified: Secondary | ICD-10-CM

## 2015-10-10 DIAGNOSIS — I2111 ST elevation (STEMI) myocardial infarction involving right coronary artery: Secondary | ICD-10-CM

## 2015-10-10 DIAGNOSIS — I2129 ST elevation (STEMI) myocardial infarction involving other sites: Secondary | ICD-10-CM | POA: Diagnosis not present

## 2015-10-10 DIAGNOSIS — Z95810 Presence of automatic (implantable) cardiac defibrillator: Secondary | ICD-10-CM

## 2015-10-10 MED ORDER — MAGNESIUM OXIDE 400 MG PO TABS: 400.0000 mg | ORAL_TABLET | Freq: Two times a day (BID) | ORAL | 1 refills | Status: DC

## 2015-10-10 NOTE — Progress Notes (Signed)
Cardiac Individual Treatment Plan  Patient Details  Name: Leonard [name] MRN: [id number] Date of Birth: [birth year] Referring Provider:   Flowsheet Row CARDIAC REHAB PHASE II ORIENTATION from 10/10/2015 in [place]  Referring Provider  Glenetta Hew MD      Initial Encounter Date:  [place] PHASE II ORIENTATION from 10/10/2015 in [place]  Date  10/10/15  Referring Provider  Glenetta Hew MD      Visit Diagnosis: 08/12/15 ST elevation myocardial infarction involving right coronary artery (HCC)  S/P ICD (internal cardiac defibrillator)08/26/2015  S/P coronary artery stent placement  Patient's Home Medications on Admission:  Current Outpatient Prescriptions:  .  amLODipine (NORVASC) 5 MG tablet, TAKE 1 TABLET BY MOUTH DAILY, Disp: 90 tablet, Rfl: 0 .  aspirin EC 81 MG tablet, Take 1 tablet (81 mg total) by mouth daily., Disp: , Rfl:  .  atorvastatin (LIPITOR) 80 MG tablet, Take 1 tablet (80 mg total) by mouth daily at 6 PM., Disp: 30 tablet, Rfl: 12 .  cholecalciferol (VITAMIN D) 1000 units tablet, Take 2,000 Units by mouth daily. , Disp: , Rfl:  .  Coenzyme Q10 (COQ10) 200 MG CAPS, Take 200 mg by mouth daily., Disp: , Rfl:  .  irbesartan (AVAPRO) 150 MG tablet, Take 1 tablet (150 mg total) by mouth daily., Disp: 30 tablet, Rfl: 12 .  ketotifen (ZADITOR) 0.025 % ophthalmic solution, Place 1 drop into both eyes as needed (for dry eyes)., Disp: , Rfl:  .  metoprolol succinate (TOPROL-XL) 50 MG 24 hr tablet, Take 1 tablet (50 mg total) by mouth daily. Take with or immediately following a meal., Disp: 30 tablet, Rfl: 6 .  Multiple Vitamin (MULTIVITAMIN) tablet, Take 1 tablet by mouth daily., Disp: , Rfl:  .  nitroGLYCERIN (NITROSTAT) 0.4 MG SL tablet, Place 1 tablet (0.4 mg total) under the tongue every 5 (five) minutes as needed for chest pain., Disp: 25 tablet, Rfl: 2 .  Omega-3 Fatty Acids (FISH  OIL) 1200 MG CPDR, Take 1 tablet by mouth daily., Disp: , Rfl:  .  tadalafil (CIALIS) 20 MG tablet, Take 20 mg by mouth daily as needed for erectile dysfunction., Disp: , Rfl:  .  ticagrelor (BRILINTA) 90 MG TABS tablet, Take 1 tablet (90 mg total) by mouth 2 (two) times daily., Disp: 60 tablet, Rfl: 12 .  venlafaxine (EFFEXOR) 75 MG tablet, TAKE 1 TABLET BY MOUTH EVERY DAY, Disp: 90 tablet, Rfl: 0 .  magnesium oxide (MAG-OX) 400 MG tablet, Take 1 tablet (400 mg total) by mouth 2 (two) times daily., Disp: 60 tablet, Rfl: 1  Past Medical History: Past Medical History:  Diagnosis Date  . Anxiety   . Atrial flutter (HCC)    a. CHA2DS2VASc = 4.  [name] CAD (coronary artery disease)    a. 07/2015 Posterior STEMI/PCI: LM nl, LAd 40ost, RI 40, RCA 150m (3.0x22 Resolute Integrity DES, 3.0x12 Resolute Integrity DES), 90d (PTCA), RPDA small, nl, RPLB2 100 - too small for PTCA.  [name] Chronic combined systolic and diastolic CHF (congestive heart failure) ([place])    a. 07/2015 Ehco: EF 45-50%, basal-mid inf and infsept AK, inflat, apical inf, and apical septal HK, Gr1 DD, triv Leonard.  [name] Hyperlipidemia   . Hypertensive heart disease   . Ischemic cardiomyopathy    a. 07/2015 Echo: EF 45-50%.  . Morbid obesity ([place])     Tobacco Use: History  Smoking Status  . Never Smoker  Smokeless  Tobacco  . Never Used    Labs: Recent Review Flowsheet Data    Labs for ITP Cardiac and Pulmonary Rehab Latest Ref Rng & Units 04/18/2015 08/12/2015   Cholestrol 0 - 200 mg/dL - 163   LDLCALC 0 - 99 mg/dL - 98   HDL >40 mg/dL - 41   Trlycerides <150 mg/dL - 122   Hemoglobin A1c 4.8 - 5.6 % 6.1(H) 6.1(H)   TCO2 0 - 100 mmol/L - 24      Capillary Blood Glucose: No results found for: GLUCAP   Exercise Target Goals: Date: 10/10/15  Exercise Program Goal: Individual exercise prescription set with THRR, safety & activity barriers. Participant demonstrates ability to understand and report RPE using BORG scale, to self-measure  pulse accurately, and to acknowledge the importance of the exercise prescription.  Exercise Prescription Goal: Starting with aerobic activity 30 plus minutes a day, 3 days per week for initial exercise prescription. Provide home exercise prescription and guidelines that participant acknowledges understanding prior to discharge.  Activity Barriers & Risk Stratification:     Activity Barriers & Cardiac Risk Stratification - 10/10/15 0906      Activity Barriers & Cardiac Risk Stratification   Activity Barriers Deconditioning;Muscular Weakness;Joint Problems;Balance Concerns  B ankle, knee and foot pain from old sport injuries   Cardiac Risk Stratification High      6 Minute Walk:     6 Minute Walk    Row Name 10/10/15 1415 10/10/15 1444       6 Minute Walk   Phase Initial Initial    Distance 1266 feet 832 feet    Walk Time 6 minutes 6 minutes    # of Rest Breaks 0 0    MPH 2.39 1.57    METS 2.76 1.4    RPE 11 13    VO2 Peak 9.66 4.9    Symptoms No Yes (comment)    Comments  - SOB, short run of V tach and 5 minute run of Sinus Tachy. Cardiologist notified    Resting HR 59 bpm 100 bpm    Resting BP 124/80 110/78    Max Ex. HR 121 bpm 122 bpm    Max Ex. BP 152/86 140/80    2 Minute Post BP 146/91  rchk 112 76 150/90       Initial Exercise Prescription:     Initial Exercise Prescription - 10/10/15 1400      Date of Initial Exercise RX and Referring Provider   Date 10/10/15   Referring Provider Glenetta Hew MD     Recumbant Bike   Level 1   Watts --   Minutes 10   METs 1.5     NuStep   Level 1   Minutes 20   METs 1.4     Arm Ergometer   Level --   Watts --   Minutes --   METs --     Prescription Details   Frequency (times per week) 3   Duration Progress to 30 minutes of continuous aerobic without signs/symptoms of physical distress     Intensity   THRR 40-80% of Max Heartrate 59-118   Ratings of Perceived Exertion 11-13   Perceived Dyspnea 0-4      Progression   Progression Continue to progress workloads to maintain intensity without signs/symptoms of physical distress.     Resistance Training   Training Prescription Yes   Weight 1lb   Reps 10-12      Perform Capillary Blood Glucose checks  as needed.  Exercise Prescription Changes:   Exercise Comments:   Discharge Exercise Prescription (Final Exercise Prescription Changes):   Nutrition:  Target Goals: Understanding of nutrition guidelines, daily intake of sodium 1500mg , cholesterol 200mg , calories 30% from fat and 7% or less from saturated fats, daily to have 5 or more servings of fruits and vegetables.  Biometrics:     Pre Biometrics - 10/10/15 1449      Pre Biometrics   Waist Circumference 53.5 inches   Hip Circumference 58 inches   Waist to Hip Ratio 0.92 %   Triceps Skinfold 38 mm   % Body Fat 42.9 %   Grip Strength 33 kg   Flexibility 0 in   Single Leg Stand 0 seconds       Nutrition Therapy Plan and Nutrition Goals:   Nutrition Discharge: Nutrition Scores:   Nutrition Goals Re-Evaluation:   Psychosocial: Target Goals: Acknowledge presence or absence of depression, maximize coping skills, provide positive support system. Participant is able to verbalize types and ability to use techniques and skills needed for reducing stress and depression.  Initial Review & Psychosocial Screening:     Initial Psych Review & Screening - 10/10/15 [place]? Yes     Barriers   Psychosocial barriers to participate in program The patient should benefit from training in stress management and relaxation.     Screening Interventions   Interventions Encouraged to exercise      Quality of Life Scores:     Quality of Life - 10/10/15 1452      Quality of Life Scores   Health/Function Pre 27.17 %   Socioeconomic Pre 27.94 %   Psych/Spiritual Pre 28.79 %   Family Pre 19.2 %   GLOBAL Pre 26.53 %       PHQ-9: Recent Review Flowsheet Data    There is no flowsheet data to display.      Psychosocial Evaluation and Intervention:   Psychosocial Re-Evaluation:   Vocational Rehabilitation: Provide vocational rehab assistance to qualifying candidates.   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      Education: Education Goals: Education classes will be provided on a weekly basis, covering required topics. Participant will state understanding/return demonstration of topics presented.  Learning Barriers/Preferences:     Learning Barriers/Preferences - 10/10/15 1443      Learning Barriers/Preferences   Learning Preferences Audio;Computer/Internet;Group Instruction;Individual Instruction;Verbal Instruction      Education Topics: Count Your Pulse:  -Group instruction provided by verbal instruction, demonstration, patient participation and written materials to support subject.  Instructors address importance of being able to find your pulse and how to count your pulse when at home without a heart monitor.  Patients get hands on experience counting their pulse with staff help and individually.   Heart Attack, Angina, and Risk Factor Modification:  -Group instruction provided by verbal instruction, video, and written materials to support subject.  Instructors address signs and symptoms of angina and heart attacks.    Also discuss risk factors for heart disease and how to make changes to improve heart health risk factors.   Functional Fitness:  -Group instruction provided by verbal instruction, demonstration, patient participation, and written materials to support subject.  Instructors address safety measures for doing things around the house.  Discuss how to get up  and down off the floor, how to pick things up properly, how to safely get out of a chair without assistance, and balance training.   Meditation and Mindfulness:  -Group instruction provided by verbal instruction, patient participation, and written materials to support subject.  Instructor addresses importance of mindfulness and meditation practice to help reduce stress and improve awareness.  Instructor also leads participants through a meditation exercise.    Stretching for Flexibility and Mobility:  -Group instruction provided by verbal instruction, patient participation, and written materials to support subject.  Instructors lead participants through series of stretches that are designed to increase flexibility thus improving mobility.  These stretches are additional exercise for major muscle groups that are typically performed during regular warm up and cool down.   Hands Only CPR Anytime:  -Group instruction provided by verbal instruction, video, patient participation and written materials to support subject.  Instructors co-teach with AHA video for hands only CPR.  Participants get hands on experience with mannequins.   Nutrition I class: Heart Healthy Eating:  -Group instruction provided by PowerPoint slides, verbal discussion, and written materials to support subject matter. The instructor gives an explanation and review of the Therapeutic Lifestyle Changes diet recommendations, which includes a discussion on lipid goals, dietary fat, sodium, fiber, plant stanol/sterol esters, sugar, and the components of a well-balanced, healthy diet.   Nutrition II class: Lifestyle Skills:  -Group instruction provided by PowerPoint slides, verbal discussion, and written materials to support subject matter. The instructor gives an explanation and review of label reading, grocery shopping for heart health, heart healthy recipe modifications, and ways to make healthier choices when eating  out.   Diabetes Question & Answer:  -Group instruction provided by PowerPoint slides, verbal discussion, and written materials to support subject matter. The instructor gives an explanation and review of diabetes co-morbidities, pre- and post-prandial blood glucose goals, pre-exercise blood glucose goals, signs, symptoms, and treatment of hypoglycemia and hyperglycemia, and foot care basics.   Diabetes Blitz:  -Group instruction provided by PowerPoint slides, verbal discussion, and written materials to support subject matter. The instructor gives an explanation and review of the physiology behind type 1 and type 2 diabetes, diabetes medications and rational behind using different medications, pre- and post-prandial blood glucose recommendations and Hemoglobin A1c goals, diabetes diet, and exercise including blood glucose guidelines for exercising safely.    Portion Distortion:  -Group instruction provided by PowerPoint slides, verbal discussion, written materials, and food models to support subject matter. The instructor gives an explanation of serving size versus portion size, changes in portions sizes over the last 20 years, and what consists of a serving from each food group.   Stress Management:  -Group instruction provided by verbal instruction, video, and written materials to support subject matter.  Instructors review role of stress in heart disease and how to cope with stress positively.     Exercising on Your Own:  -Group instruction provided by verbal instruction, power point, and written materials to support subject.  Instructors discuss benefits of exercise, components of exercise, frequency and intensity of exercise, and end points for exercise.  Also discuss use of nitroglycerin and activating EMS.  Review options of places to exercise outside of rehab.  Review guidelines for sex with heart disease.   Cardiac Drugs I:  -Group instruction provided by verbal instruction and  written materials to support subject.  Instructor reviews cardiac  drug classes: antiplatelets, anticoagulants, beta blockers, and statins.  Instructor discusses reasons, side effects, and lifestyle considerations for each drug class.   Cardiac Drugs II:  -Group instruction provided by verbal instruction and written materials to support subject.  Instructor reviews cardiac drug classes: angiotensin converting enzyme inhibitors (ACE-I), angiotensin II receptor blockers (ARBs), nitrates, and calcium channel blockers.  Instructor discusses reasons, side effects, and lifestyle considerations for each drug class.   Anatomy and Physiology of the Circulatory System:  -Group instruction provided by verbal instruction, video, and written materials to support subject.  Reviews functional anatomy of heart, how it relates to various diagnoses, and what role the heart plays in the overall system.   Knowledge Questionnaire Score:     Knowledge Questionnaire Score - 10/10/15 1415      Knowledge Questionnaire Score   Pre Score 19/24      Core Components/Risk Factors/Patient Goals at Admission:     Personal Goals and Risk Factors at Admission - 10/10/15 1450      Core Components/Risk Factors/Patient Goals on Admission   Heart Failure Yes   Intervention Provide a combined exercise and nutrition program that is supplemented with education, support and counseling about heart failure. Directed toward relieving symptoms such as shortness of breath, decreased exercise tolerance, and extremity edema.   Expected Outcomes Improve functional capacity of life;Short term: Daily weights obtained and reported for increase. Utilizing diuretic protocols set by physician.;Long term: Adoption of self-care skills and reduction of barriers for early signs and symptoms recognition and intervention leading to self-care maintenance.;Short term: Attendance in program 2-3 days a week with increased exercise capacity. Reported  lower sodium intake. Reported increased fruit and vegetable intake. Reports medication compliance.      Core Components/Risk Factors/Patient Goals Review:    Core Components/Risk Factors/Patient Goals at Discharge (Final Review):    ITP Comments:     ITP Comments    Row Name 10/10/15 0901           ITP Comments Dr. Fransico Him, Medical Director          Comments:Leonard [name]  Was noted to be in bigeminal PVC's prior to his walk test. I called Jettie Booze NP to notify. Junie Panning said it was okay for Leonard [name] to proceed with his walk test she said it was okay. Patient attended orientation from 0800 to 1100 to review rules and guidelines for program. Completed 6 minute with the use of a wheelchair for stability during the walk test, Intitial ITP, and exercise prescription.  VSS. Telemetry-Sinus rhythm with bigeminal PVC's.  Asymptomatic.Leonard [name] went into what looked like a wide complex tachycardia at the end of his walk test with a heart rate of 122 . Blood pressure 140/80 Oxygen saturation 96% on room air..Patient remained alert and oriented and only complained of shortness of breath. Leonard [name] remained in the wide complex for a few minutes before he converted back to a sinus rhythm with frequent PVC's. I called Jettie Booze NP back to notify and obtained a 12 lead ECG. Junie Panning came down to cardiac rehab. Reviewed the ECG and called Chanetta Marshall NP to come and see Leonard [name] in cardiac rehab. Amber came to cardiac rehab and interrogated Leonard [name]'s ICD. Amber said that Leonard [name] had a small run of nonsustained vtach and that the wide complex rhythm was a sinus sinus tach. Amber said that Leonard [name]  Can begin exercise on Monday as scheduled. Jettie Booze NP said she would arrange a follow  up appointment for Leonard [name] to be seen in the office for his post MI follow up. Dr Allison Quarry office called and notified about today's events. I spoke with Charlotte Sanes RN. Will fax exercise flow  sheets to Dr. Allison Quarry office for review with today's ECG tracings. Exit heart rate 94. Exit blood pressure 104/70. Leonard [name] left cardiac rehab without complaints or symptoms and is looking forward to beginning exercise on Monday.Barnet Pall, RN,BSN 10/10/2015 3:40 PM

## 2015-10-10 NOTE — Telephone Encounter (Signed)
Late entry - note I spoke w Verdis Frederickson earlier around 2pm. Currently pending receipt of fax for ECG tracings as she has noted she was sending. I don't see these on fax tray, but tracings are viewable in EPIC on 9/21 encounter. Verdis Frederickson called to note concerns r/t patient fatigue on treadmill, concerning finding of a ?v tach or wide complex sinus tach. The tracings were reviewed by Dr. Tamala Julian and Chanetta Marshall NP who put in a progress note on patient. Noted patient safe to resume exercise and would consider appropriate for patient to continue rehab. Verdis Frederickson wanted to alert Dr. Ellyn Hack to findings and also see if need for f/u in office. Pt was sent home today but will return Monday for scheduled Cardiac Rehab.  Pt hx notable for STEMI, ICM, CHF -- had recent ICD implantation, device fired on 09/27/15 (see Shakila's documentation 9/18). Device functioning normally at time of last check w leads intact and op site in normal healing stage.   Please advise on any recommendations, if you are able to review chart notes.

## 2015-10-10 NOTE — Telephone Encounter (Signed)
New message       Leonard [name] from [facility] verbalized that she is sending traces of strips over she wants a triage rn to call her back

## 2015-10-10 NOTE — Progress Notes (Signed)
Cardiac Rehab Medication Review by a Pharmacist  Does the patient  feel that his/her medications are working for him/her?  yes  Has the patient been experiencing any side effects to the medications prescribed?  no  Does the patient measure his/her own blood pressure or blood glucose at home?  no   Does the patient have any problems obtaining medications due to transportation or finances?   no  Understanding of regimen: good Understanding of indications: good Potential of compliance: good    Pharmacist comments: Patient presents in good spirits, ambulating without assistance or difficulty. Pt reports he notices he bleeds easily but denies any bruises, blood in stools or urine, epistaxis, bleeding gums, s/sx of stroke. Pt reports the bleeding always stops in 5 mins if he cuts himself. Pt is aware that there is a significant drug-drug interaction between cialis and nitroglycerin. Educated on risk of hypotension if the two medications are taken together. Pt reports his doctors are aware he is on both drugs.   Recommend against the use of cialis in this patient due to contraindication of concomitant therapy.    Carlean Jews, Pharm.D. PGY1 Pharmacy Resident 9/21/20179:22 AM Pager [phone]

## 2015-10-10 NOTE — Telephone Encounter (Signed)
Asked to see patient today in cardiac rehab for ?VT.  Telemetry review demonstrates sinus rhythm with frequent ventricular ectopy and short run of NSVT.  ICD interrogated without any sustained ventricular arrhythmias since VF episode on 09/27/15.    I have encouraged him in his continued weight loss efforts. He has had some feelings of being "overwhelmed" post ICD implant. I think that participating in cardiac rehab will allow him to exercise in a safe place and feel that he can increase activity level at home.    Mg low on labs yesterday. Will start Mag-Ox 400mg  bid.  Rx sent in to pharmacy.   Follow up as scheduled. Ok to participate in cardiac rehab.  Chanetta Marshall, NP 10/10/2015 10:56 AM

## 2015-10-14 ENCOUNTER — Encounter (HOSPITAL_COMMUNITY)
Admission: RE | Admit: 2015-10-14 | Discharge: 2015-10-14 | Disposition: A | Discharge status: Home / Self Care | Payer: Medicare Other | Source: Ambulatory Visit | Attending: Cardiology | Admitting: Cardiology

## 2015-10-14 ENCOUNTER — Ambulatory Visit (INDEPENDENT_AMBULATORY_CARE_PROVIDER_SITE_OTHER): Payer: Medicare Other | Admitting: Family Medicine

## 2015-10-14 ENCOUNTER — Encounter: Payer: Self-pay | Admitting: Family Medicine

## 2015-10-14 ENCOUNTER — Telehealth: Payer: Self-pay | Admitting: Physician Assistant

## 2015-10-14 VITALS — BP 132/80 | HR 60 | Temp 97.6°F | Wt 295.8 lb

## 2015-10-14 DIAGNOSIS — Z9181 History of falling: Secondary | ICD-10-CM | POA: Diagnosis not present

## 2015-10-14 DIAGNOSIS — Z23 Encounter for immunization: Secondary | ICD-10-CM | POA: Diagnosis not present

## 2015-10-14 DIAGNOSIS — Z955 Presence of coronary angioplasty implant and graft: Secondary | ICD-10-CM

## 2015-10-14 DIAGNOSIS — I2111 ST elevation (STEMI) myocardial infarction involving right coronary artery: Secondary | ICD-10-CM

## 2015-10-14 DIAGNOSIS — Z95810 Presence of automatic (implantable) cardiac defibrillator: Secondary | ICD-10-CM

## 2015-10-14 DIAGNOSIS — N183 Chronic kidney disease, stage 3 unspecified: Secondary | ICD-10-CM

## 2015-10-14 DIAGNOSIS — I10 Essential (primary) hypertension: Secondary | ICD-10-CM | POA: Diagnosis not present

## 2015-10-14 DIAGNOSIS — I255 Ischemic cardiomyopathy: Secondary | ICD-10-CM | POA: Diagnosis not present

## 2015-10-14 LAB — CBC WITH DIFFERENTIAL/PLATELET
BASOS ABS: 0 {cells}/uL (ref 0–200)
BASOS PCT: 0 %
EOS PCT: 2 %
Eosinophils Absolute: 268 cells/uL (ref 15–500)
HCT: 44.5 % (ref 38.5–50.0)
HEMOGLOBIN: 14.8 g/dL (ref 13.2–17.1)
LYMPHS ABS: 2278 {cells}/uL (ref 850–3900)
Lymphocytes Relative: 17 %
MCH: 31.8 pg (ref 27.0–33.0)
MCHC: 33.3 g/dL (ref 32.0–36.0)
MCV: 95.7 fL (ref 80.0–100.0)
MONOS PCT: 5 %
MPV: 10.2 fL (ref 7.5–12.5)
Monocytes Absolute: 670 cells/uL (ref 200–950)
NEUTROS ABS: 10184 {cells}/uL — AB (ref 1500–7800)
Neutrophils Relative %: 76 %
PLATELETS: 280 10*3/uL (ref 140–400)
RBC: 4.65 MIL/uL (ref 4.20–5.80)
RDW: 14.8 % (ref 11.0–15.0)
WBC: 13.4 10*3/uL — ABNORMAL HIGH (ref 4.0–10.5)

## 2015-10-14 LAB — COMPLETE METABOLIC PANEL WITH GFR
ALBUMIN: 4.2 g/dL (ref 3.6–5.1)
ALK PHOS: 75 U/L (ref 40–115)
ALT: 17 U/L (ref 9–46)
AST: 17 U/L (ref 10–35)
BILIRUBIN TOTAL: 0.9 mg/dL (ref 0.2–1.2)
BUN: 16 mg/dL (ref 7–25)
CO2: 21 mmol/L (ref 20–31)
Calcium: 8.9 mg/dL (ref 8.6–10.3)
Chloride: 104 mmol/L (ref 98–110)
Creat: 0.93 mg/dL (ref 0.70–1.18)
GFR, EST NON AFRICAN AMERICAN: 82 mL/min (ref >=60)
GFR, Est African American: >89 mL/min (ref >=60)
GLUCOSE: 114 mg/dL — AB (ref 65–99)
Potassium: 4.4 mmol/L (ref 3.5–5.3)
SODIUM: 135 mmol/L (ref 135–146)
TOTAL PROTEIN: 6.5 g/dL (ref 6.1–8.1)

## 2015-10-14 NOTE — Telephone Encounter (Signed)
    Maria from cardiac rehab called me to discuss Leonard [name]. She feels that he is too weak and deconditioned to participate in cardiac rehab which is a group exercise program. She said patient has shuffling gate and has had two recent falls. Had bleeding lacerations and bruises currently. She thinks he needs to participate in Boost program to get stronger before cardiac rehab and also see PCP to figure out why he is falling and shuffling. Will post pone cardiac rehab at this time.   Angelena Form PA-C  [facility]

## 2015-10-14 NOTE — Progress Notes (Signed)
Incomplete Session Note  Patient Details  Name: Leonard [name] MRN: [id number] Date of Birth: 1945/04/08 Referring Provider:   Flowsheet Row CARDIAC REHAB PHASE II ORIENTATION from 10/10/2015 in [place]  Referring Provider  Leonard Hew MD      Leonard [name] did not complete his rehab session.  Leonard [name] reported to cardiac rehab. Patient reported to cardiac rehab with several old abrasions to bilateral knees left lower inner calf and left outer forearm. Blood pressure 108/72. Oxygen saturation 98% on room air. Leonard [name] said he fell Thursday evening carrying packages into the house. Leonard [name] does have a slightly unsteady gait when he walks. Leonard [name] does have a cane but reports he does not always use. I called Leonard Range PA to notify about Leonard [name]'s recent fall this is the second in one month. Leonard [name] said it was okay for Leonard [name] to be evaluated by his primary care. Leonard [name] may benefit from a boost evaluation. Leonard [name] had some oozing areas to both of his knees and right shin. I cleaned Leonard [name]'s area's with soap and water and applied Band-Aid's. Leonard Dingwall NP's office called. Appointment made for office follow up for today at 1;20 pm. Leonard [name] did not exercise today. Patient taken to Valet parking via wheelchair.Leonard Pall, RN,BSN 10/14/2015 10:11 AM

## 2015-10-14 NOTE — Patient Instructions (Addendum)
You received your flu shot today.  Use caution when walking, go slow and use handrails.  Avoid alcohol.  Stay well hydrated and eat a heart healthy diet.   Fall Prevention in the Home  Falls can cause injuries and can affect people from all age groups. There are many simple things that you can do to make your home safe and to help prevent falls. WHAT CAN I DO ON THE OUTSIDE OF MY HOME?  Regularly repair the edges of walkways and driveways and fix any cracks.  Remove high doorway thresholds.  Trim any shrubbery on the main path into your home.  Use bright outdoor lighting.  Clear walkways of debris and clutter, including tools and rocks.  Regularly check that handrails are securely fastened and in good repair. Both sides of any steps should have handrails.  Install guardrails along the edges of any raised decks or porches.  Have leaves, snow, and ice cleared regularly.  Use sand or salt on walkways during winter months.  In the garage, clean up any spills right away, including grease or oil spills. WHAT CAN I DO IN THE BATHROOM?  Use night lights.  Install grab bars by the toilet and in the tub and shower. Do not use towel bars as grab bars.  Use non-skid mats or decals on the floor of the tub or shower.  If you need to sit down while you are in the shower, use a plastic, non-slip stool.[name]  Keep the floor dry. Immediately clean up any water that spills on the floor.  Remove soap buildup in the tub or shower on a regular basis.  Attach bath mats securely with double-sided non-slip rug tape.  Remove throw rugs and other tripping hazards from the floor. WHAT CAN I DO IN THE BEDROOM?  Use night lights.  Make sure that a bedside light is easy to reach.  Do not use oversized bedding that drapes onto the floor.  Have a firm chair that has side arms to use for getting dressed.  Remove throw rugs and other tripping hazards from the floor. WHAT CAN I DO IN THE KITCHEN?    Clean up any spills right away.  Avoid walking on wet floors.  Place frequently used items in easy-to-reach places.  If you need to reach for something above you, use a sturdy step stool that has a grab bar.  Keep electrical cables out of the way.  Do not use floor polish or wax that makes floors slippery. If you have to use wax, make sure that it is non-skid floor wax.  Remove throw rugs and other tripping hazards from the floor. WHAT CAN I DO IN THE STAIRWAYS?  Do not leave any items on the stairs.  Make sure that there are handrails on both sides of the stairs. Fix handrails that are broken or loose. Make sure that handrails are as long as the stairways.  Check any carpeting to make sure that it is firmly attached to the stairs. Fix any carpet that is loose or worn.  Avoid having throw rugs at the top or bottom of stairways, or secure the rugs with carpet tape to prevent them from moving.  Make sure that you have a light switch at the top of the stairs and the bottom of the stairs. If you do not have them, have them installed. WHAT ARE SOME OTHER FALL PREVENTION TIPS?  Wear closed-toe shoes that fit well and support your feet. Wear shoes that have rubber  soles or low heels.  When you use a stepladder, make sure that it is completely opened and that the sides are firmly locked. Have someone hold the ladder while you are using it. Do not climb a closed stepladder.  Add color or contrast paint or tape to grab bars and handrails in your home. Place contrasting color strips on the first and last steps.  Use mobility aids as needed, such as canes, walkers, scooters, and crutches.  Turn on lights if it is dark. Replace any light bulbs that burn out.  Set up furniture so that there are clear paths. Keep the furniture in the same spot.  Fix any uneven floor surfaces.  Choose a carpet design that does not hide the edge of steps of a stairway.  Be aware of any and all  pets.  Review your medicines with your healthcare provider. Some medicines can cause dizziness or changes in blood pressure, which increase your risk of falling. Talk with your health care provider about other ways that you can decrease your risk of falls. This may include working with a physical therapist or trainer to improve your strength, balance, and endurance.   This information is not intended to replace advice given to you by your health care provider. Make sure you discuss any questions you have with your health care provider.   Document Released: 12/26/2001 Document Revised: 05/22/2014 Document Reviewed: 02/09/2014 Elsevier Interactive Patient Education [employer].

## 2015-10-14 NOTE — Progress Notes (Signed)
Subjective:    Patient ID: Leonard [name], male    DOB: 10/09/1945, 70 y.o.   MRN: [id number]  HPI Chief Complaint  Patient presents with  . fall    fallen last thursday   He is here due to having a recent fall. He was told by staff at cardiac rehab that he should be evaluated for the fall.  He started cardiac rehab last Thursday.  States prior to his fall that he went to dinner and had 2 alcoholic drinks and he was carrying in bags and unable to use the handrails up the steps into his house. States he tripped walking up the steps and landed on concrete. Denies hitting his head, no loc.  Denies fever, chills, headache, vision changes, neck pain, back pain, chest pain, palpitations, shortness of breath, nausea, vomiting, diarrhea. Denies numbness, tingling or weakness to extremities.  States he feels "fine".  Denies having a previous fall.  He does have a cane at home but he does not use it.   Up to date on Tdap.   Past Medical History:  Diagnosis Date  . Anxiety   . Atrial flutter (HCC)    a. CHA2DS2VASc = 4.  [name] CAD (coronary artery disease)    a. 07/2015 Posterior STEMI/PCI: LM nl, LAd 40ost, RI 40, RCA 146m (3.0x22 Resolute Integrity DES, 3.0x12 Resolute Integrity DES), 90d (PTCA), RPDA small, nl, RPLB2 100 - too small for PTCA.  [name] Chronic combined systolic and diastolic CHF (congestive heart failure) ([place])    a. 07/2015 Ehco: EF 45-50%, basal-mid inf and infsept AK, inflat, apical inf, and apical septal HK, Gr1 DD, triv MR.  [name] Hyperlipidemia   . Hypertensive heart disease   . Ischemic cardiomyopathy    a. 07/2015 Echo: EF 45-50%.  . Morbid obesity ([place])      Review of Systems Pertinent positives and negatives in the history of present illness.     Objective:   Physical Exam  Constitutional: He is oriented to person, place, and time. He appears well-developed and well-nourished. No distress.  HENT:  Head: Normocephalic and atraumatic.  Nose: Nose normal.    Mouth/Throat: Oropharynx is clear and moist.  Eyes: Conjunctivae and EOM are normal. Pupils are equal, round, and reactive to light.  Neck: Normal range of motion. Neck supple.  Cardiovascular: Normal rate, regular rhythm and intact distal pulses.   Pulmonary/Chest: Effort normal and breath sounds normal.  Abdominal: Soft. He exhibits no distension. There is no tenderness.  Musculoskeletal: Normal range of motion.  Neurological: He is alert and oriented to person, place, and time. He has normal strength and normal reflexes. No cranial nerve deficit or sensory deficit. Coordination normal. GCS eye subscore is 4. GCS verbal subscore is 5. GCS motor subscore is 6.  Normal finger to nose testing.  Ambulates with a slightly antalgic gait.   Skin: Skin is warm and dry. Abrasion noted. No cyanosis. No pallor.     Abrasions noted to areas, covered with band aids.   Psychiatric: He has a normal mood and affect. His speech is normal and behavior is normal. Judgment and thought content normal. Cognition and memory are normal.   BP 132/80 (BP Location: Right Arm, Patient Position: Standing, Cuff Size: Large)   Pulse 60   Temp 97.6 F (36.4 C) (Oral)   Wt 295 lb 12.8 oz (134.2 kg)   SpO2 98%   BMI 41.55 kg/m   Orthostatic vitals: negative     Assessment & Plan:  History of recent fall - Plan: Orthostatic vital signs, CBC with Differential/Platelet, COMPLETE METABOLIC PANEL WITH GFR  Need for prophylactic vaccination and inoculation against influenza - Plan: Flu vaccine HIGH DOSE PF  Essential hypertension - Plan: COMPLETE METABOLIC PANEL WITH GFR  Chronic kidney disease (CKD), stage [name] (moderate) - Plan: COMPLETE METABOLIC PANEL WITH GFR  His recent fall appears to be related to alcohol use and not using a hand rail when going up his steps. He does not appear to have any neurological or cardiac symptoms.  Counseled on fall prevention and avoiding alcohol or other sedatives.  Reviewed  medications.  Blood pressure is within goal range. Continue current medication regimen.  Labs ordered to ensure no underlying etiology present.  Tdap up to date  Flu shot given.  Recommend he follow up for a medicare AWV in the next couple of months.  He appears to be able to participate in cardiac rehab without any restrictions.

## 2015-10-16 ENCOUNTER — Telehealth (HOSPITAL_COMMUNITY): Payer: Self-pay | Admitting: *Deleted

## 2015-10-16 ENCOUNTER — Encounter (HOSPITAL_COMMUNITY)
Admission: RE | Admit: 2015-10-16 | Discharge: 2015-10-16 | Disposition: A | Discharge status: Home / Self Care | Payer: Medicare Other | Source: Ambulatory Visit | Attending: Cardiology | Admitting: Cardiology

## 2015-10-16 ENCOUNTER — Telehealth: Payer: Self-pay | Admitting: Cardiology

## 2015-10-16 DIAGNOSIS — I213 ST elevation (STEMI) myocardial infarction of unspecified site: Secondary | ICD-10-CM | POA: Diagnosis not present

## 2015-10-16 DIAGNOSIS — I491 Atrial premature depolarization: Secondary | ICD-10-CM | POA: Diagnosis not present

## 2015-10-16 DIAGNOSIS — I2129 ST elevation (STEMI) myocardial infarction involving other sites: Secondary | ICD-10-CM | POA: Diagnosis not present

## 2015-10-16 DIAGNOSIS — Z955 Presence of coronary angioplasty implant and graft: Secondary | ICD-10-CM | POA: Diagnosis not present

## 2015-10-16 NOTE — Progress Notes (Signed)
Daily Session Note  Patient Details  Name: Arek Spadafore MRN: [id number] Date of Birth: 05-23-1945 Referring Provider:   Flowsheet Row CARDIAC REHAB PHASE II ORIENTATION from 10/10/2015 in [place]  Referring Provider  Glenetta Hew MD      Encounter Date: 10/16/2015  Check In:   Capillary Blood Glucose: No results found for this or any previous visit (from the past 24 hour(s)).   Goals Met:  Exercise tolerated well  Goals Unmet:  Not Applicable  Comments: Pt started cardiac rehab today.  Pt tolerated light exercise without difficulty. VSS, telemetry-Sinus rhythm with PVC's., asymptomatic.  Medication list reconciled. Pt denies barriers to medicaiton compliance.  PSYCHOSOCIAL ASSESSMENT:  PHQ-0. Pt exhibits positive coping skills, hopeful outlook with supportive family. No psychosocial needs identified at this time, no psychosocial interventions necessary.    Pt enjoys reading and gambling.   Pt oriented to exercise equipment and routine.    Understanding verbalized. Dominiq started taking his magnesium supplement yesterday and does not have as much ectopy as he did last week. Wister used a rolling walker for stability. Will continue to monitor the patient throughout  the program.Maria Venetia Maxon, RN,BSN 10/16/2015 12:19 PM    Dr. Fransico Him is Medical Director for Cardiac Rehab at [facility].

## 2015-10-16 NOTE — Telephone Encounter (Signed)
Closed Encounter  °

## 2015-10-16 NOTE — Telephone Encounter (Signed)
-----   Message from Girtha Rm, NP sent at 10/14/2015  5:17 PM EDT ----- Regarding: RE: cardiac Thera [place]ake, Yes, I think he is fine to start cardiac rehab. I have not finished my note from his visit so I apologize for that.  He said he would share with you that he most likely fell due to having alcoholic beverages that evening. I counseled him on this and using handrails when going up and down stairs.  Thanks for sending him.  Vickie, NP-C   ----- Message ----- From: Magda Kiel, RN Sent: 10/14/2015   2:36 PM To: Girtha Rm, NP Subject: cardiac reha                                   Good afternoon Vickie,  Thanks for seeing Mr Leonard [name] this afternoon. I saw your note. Is it okay for Mr Leonard [name] to begin exercise on Wednesday?  Thanks for your input!   Verdis Frederickson

## 2015-10-17 DIAGNOSIS — Z85828 Personal history of other malignant neoplasm of skin: Secondary | ICD-10-CM | POA: Diagnosis not present

## 2015-10-17 DIAGNOSIS — L821 Other seborrheic keratosis: Secondary | ICD-10-CM | POA: Diagnosis not present

## 2015-10-17 DIAGNOSIS — L0109 Other impetigo: Secondary | ICD-10-CM | POA: Diagnosis not present

## 2015-10-17 DIAGNOSIS — L565 Disseminated superficial actinic porokeratosis (DSAP): Secondary | ICD-10-CM | POA: Diagnosis not present

## 2015-10-17 DIAGNOSIS — D225 Melanocytic nevi of trunk: Secondary | ICD-10-CM | POA: Diagnosis not present

## 2015-10-17 DIAGNOSIS — L0889 Other specified local infections of the skin and subcutaneous tissue: Secondary | ICD-10-CM | POA: Diagnosis not present

## 2015-10-17 DIAGNOSIS — L814 Other melanin hyperpigmentation: Secondary | ICD-10-CM | POA: Diagnosis not present

## 2015-10-17 DIAGNOSIS — C44612 Basal cell carcinoma of skin of right upper limb, including shoulder: Secondary | ICD-10-CM | POA: Diagnosis not present

## 2015-10-18 ENCOUNTER — Encounter (HOSPITAL_COMMUNITY)
Admission: RE | Admit: 2015-10-18 | Discharge: 2015-10-18 | Disposition: A | Discharge status: Home / Self Care | Payer: Medicare Other | Source: Ambulatory Visit | Attending: Cardiology | Admitting: Cardiology

## 2015-10-18 DIAGNOSIS — I213 ST elevation (STEMI) myocardial infarction of unspecified site: Secondary | ICD-10-CM | POA: Diagnosis not present

## 2015-10-18 DIAGNOSIS — Z955 Presence of coronary angioplasty implant and graft: Secondary | ICD-10-CM | POA: Diagnosis not present

## 2015-10-18 DIAGNOSIS — I2111 ST elevation (STEMI) myocardial infarction involving right coronary artery: Secondary | ICD-10-CM

## 2015-10-18 DIAGNOSIS — I2129 ST elevation (STEMI) myocardial infarction involving other sites: Secondary | ICD-10-CM | POA: Diagnosis not present

## 2015-10-18 DIAGNOSIS — I491 Atrial premature depolarization: Secondary | ICD-10-CM | POA: Diagnosis not present

## 2015-10-21 ENCOUNTER — Encounter (HOSPITAL_COMMUNITY)
Admission: RE | Admit: 2015-10-21 | Discharge: 2015-10-21 | Disposition: A | Discharge status: Home / Self Care | Payer: Medicare Other | Source: Ambulatory Visit | Attending: Cardiology | Admitting: Cardiology

## 2015-10-21 DIAGNOSIS — I2129 ST elevation (STEMI) myocardial infarction involving other sites: Secondary | ICD-10-CM | POA: Diagnosis not present

## 2015-10-21 DIAGNOSIS — I491 Atrial premature depolarization: Secondary | ICD-10-CM | POA: Diagnosis not present

## 2015-10-21 DIAGNOSIS — I2111 ST elevation (STEMI) myocardial infarction involving right coronary artery: Secondary | ICD-10-CM

## 2015-10-21 DIAGNOSIS — Z955 Presence of coronary angioplasty implant and graft: Secondary | ICD-10-CM | POA: Insufficient documentation

## 2015-10-21 DIAGNOSIS — I213 ST elevation (STEMI) myocardial infarction of unspecified site: Secondary | ICD-10-CM | POA: Insufficient documentation

## 2015-10-23 ENCOUNTER — Telehealth (HOSPITAL_COMMUNITY): Payer: Self-pay | Admitting: Family Medicine

## 2015-10-23 ENCOUNTER — Encounter (HOSPITAL_COMMUNITY): Payer: Medicare Other

## 2015-10-25 ENCOUNTER — Encounter (HOSPITAL_COMMUNITY)
Admission: RE | Admit: 2015-10-25 | Discharge: 2015-10-25 | Disposition: A | Discharge status: Home / Self Care | Payer: Medicare Other | Source: Ambulatory Visit | Attending: Cardiology | Admitting: Cardiology

## 2015-10-25 DIAGNOSIS — I213 ST elevation (STEMI) myocardial infarction of unspecified site: Secondary | ICD-10-CM | POA: Diagnosis not present

## 2015-10-25 DIAGNOSIS — I2111 ST elevation (STEMI) myocardial infarction involving right coronary artery: Secondary | ICD-10-CM

## 2015-10-25 DIAGNOSIS — Z955 Presence of coronary angioplasty implant and graft: Secondary | ICD-10-CM | POA: Diagnosis not present

## 2015-10-25 DIAGNOSIS — I491 Atrial premature depolarization: Secondary | ICD-10-CM | POA: Diagnosis not present

## 2015-10-25 DIAGNOSIS — I2129 ST elevation (STEMI) myocardial infarction involving other sites: Secondary | ICD-10-CM | POA: Diagnosis not present

## 2015-10-25 DIAGNOSIS — Z95810 Presence of automatic (implantable) cardiac defibrillator: Secondary | ICD-10-CM

## 2015-10-28 ENCOUNTER — Encounter (HOSPITAL_COMMUNITY)
Admission: RE | Admit: 2015-10-28 | Discharge: 2015-10-28 | Disposition: A | Discharge status: Home / Self Care | Payer: Medicare Other | Source: Ambulatory Visit | Attending: Cardiology | Admitting: Cardiology

## 2015-10-28 DIAGNOSIS — Z955 Presence of coronary angioplasty implant and graft: Secondary | ICD-10-CM

## 2015-10-28 DIAGNOSIS — Z95810 Presence of automatic (implantable) cardiac defibrillator: Secondary | ICD-10-CM

## 2015-10-28 DIAGNOSIS — I491 Atrial premature depolarization: Secondary | ICD-10-CM | POA: Diagnosis not present

## 2015-10-28 DIAGNOSIS — I213 ST elevation (STEMI) myocardial infarction of unspecified site: Secondary | ICD-10-CM | POA: Diagnosis not present

## 2015-10-28 DIAGNOSIS — I2111 ST elevation (STEMI) myocardial infarction involving right coronary artery: Secondary | ICD-10-CM

## 2015-10-28 DIAGNOSIS — I2129 ST elevation (STEMI) myocardial infarction involving other sites: Secondary | ICD-10-CM | POA: Diagnosis not present

## 2015-10-28 NOTE — Progress Notes (Signed)
Reviewed home exercise guidelines with patient including endpoints, temperature precautions, target heart rate and rate of perceived exertion. Pt is currently walking 8-10 minutes twice daily as his mode of home exercise. Encouraged pt to increase to 15 minutes twice daily as tolerated, pt is amenable to this. Pt voices understanding of instructions given. Sol Passer, MS, ACSM CCEP

## 2015-10-30 ENCOUNTER — Encounter (HOSPITAL_COMMUNITY): Payer: Medicare Other

## 2015-10-31 NOTE — Progress Notes (Addendum)
Cardiac Individual Treatment Plan  Patient Details  Name: Leonard [name] MRN: [id number] Date of Birth: 05/02/1945 Referring Provider:   Flowsheet Row CARDIAC REHAB PHASE II ORIENTATION from 10/10/2015 in [facility]  Referring Provider  Harding, David MD      Initial Encounter Date:  Flowsheet Row CARDIAC REHAB PHASE II ORIENTATION from 10/10/2015 in [facility]  Date  10/10/15  Referring Provider  Harding, David MD      Visit Diagnosis: 08/12/15 ST elevation myocardial infarction involving right coronary artery (HCC)  S/P coronary artery stent placement  S/P ICD (internal cardiac defibrillator)08/26/2015  Patient's Home Medications on Admission:  Current Outpatient Prescriptions:  .  amLODipine (NORVASC) 5 MG tablet, TAKE 1 TABLET BY MOUTH DAILY, Disp: 90 tablet, Rfl: 0 .  aspirin EC 81 MG tablet, Take 1 tablet (81 mg total) by mouth daily., Disp: , Rfl:  .  atorvastatin (LIPITOR) 80 MG tablet, Take 1 tablet (80 mg total) by mouth daily at 6 PM., Disp: 30 tablet, Rfl: 12 .  cholecalciferol (VITAMIN D) 1000 units tablet, Take 2,000 Units by mouth daily. , Disp: , Rfl:  .  Coenzyme Q10 (COQ10) 200 MG CAPS, Take 200 mg by mouth daily., Disp: , Rfl:  .  irbesartan (AVAPRO) 150 MG tablet, Take 1 tablet (150 mg total) by mouth daily., Disp: 30 tablet, Rfl: 12 .  ketotifen (ZADITOR) 0.025 % ophthalmic solution, Place 1 drop into both eyes as needed (for dry eyes)., Disp: , Rfl:  .  magnesium oxide (MAG-OX) 400 MG tablet, Take 1 tablet (400 mg total) by mouth 2 (two) times daily., Disp: 60 tablet, Rfl: 1 .  metoprolol succinate (TOPROL-XL) 50 MG 24 hr tablet, Take 1 tablet (50 mg total) by mouth daily. Take with or immediately following a meal., Disp: 30 tablet, Rfl: 6 .  Multiple Vitamin (MULTIVITAMIN) tablet, Take 1 tablet by mouth daily., Disp: , Rfl:  .  nitroGLYCERIN (NITROSTAT) 0.4 MG SL tablet, Place 1 tablet (0.4 mg  total) under the tongue every 5 (five) minutes as needed for chest pain., Disp: 25 tablet, Rfl: 2 .  Omega-3 Fatty Acids (FISH OIL) 1200 MG CPDR, Take 1 tablet by mouth daily., Disp: , Rfl:  .  tadalafil (CIALIS) 20 MG tablet, Take 20 mg by mouth daily as needed for erectile dysfunction., Disp: , Rfl:  .  ticagrelor (BRILINTA) 90 MG TABS tablet, Take 1 tablet (90 mg total) by mouth 2 (two) times daily., Disp: 60 tablet, Rfl: 12 .  venlafaxine (EFFEXOR) 75 MG tablet, TAKE 1 TABLET BY MOUTH EVERY DAY, Disp: 90 tablet, Rfl: 0  Past Medical History: Past Medical History:  Diagnosis Date  . Anxiety   . Atrial flutter (HCC)    a. CHA2DS2VASc = 4.  . CAD (coronary artery disease)    a. 07/2015 Posterior STEMI/PCI: LM nl, LAd 40ost, RI 40, RCA 100m (3.0x22 Resolute Integrity DES, 3.0x12 Resolute Integrity DES), 90d (PTCA), RPDA small, nl, RPLB2 100 - too small for PTCA.  . Chronic combined systolic and diastolic CHF (congestive heart failure) (HCC)    a. 07/2015 Ehco: EF 45-50%, basal-mid inf and infsept AK, inflat, apical inf, and apical septal HK, Gr1 DD, triv MR.  . Hyperlipidemia   . Hypertensive heart disease   . Ischemic cardiomyopathy    a. 07/2015 Echo: EF 45-50%.  . Morbid obesity (HCC)     Tobacco Use: History  Smoking Status  . Never Smoker  Smokeless   Tobacco  . Never Used    Labs: Recent Review Flowsheet Data    Labs for ITP Cardiac and Pulmonary Rehab Latest Ref Rng & Units 04/18/2015 08/12/2015   Cholestrol 0 - 200 mg/dL - 163   LDLCALC 0 - 99 mg/dL - 98   HDL >40 mg/dL - 41   Trlycerides <150 mg/dL - 122   Hemoglobin A1c 4.8 - 5.6 % 6.1(H) 6.1(H)   TCO2 0 - 100 mmol/L - 24      Capillary Blood Glucose: No results found for: GLUCAP   Exercise Target Goals:    Exercise Program Goal: Individual exercise prescription set with THRR, safety & activity barriers. Participant demonstrates ability to understand and report RPE using BORG scale, to self-measure pulse  accurately, and to acknowledge the importance of the exercise prescription.  Exercise Prescription Goal: Starting with aerobic activity 30 plus minutes a day, 3 days per week for initial exercise prescription. Provide home exercise prescription and guidelines that participant acknowledges understanding prior to discharge.  Activity Barriers & Risk Stratification:     Activity Barriers & Cardiac Risk Stratification - 10/10/15 0906      Activity Barriers & Cardiac Risk Stratification   Activity Barriers Deconditioning;Muscular Weakness;Joint Problems;Balance Concerns  B ankle, knee and foot pain from old sport injuries   Cardiac Risk Stratification High      6 Minute Walk:     6 Minute Walk    Row Name 10/10/15 1415 10/10/15 1444       6 Minute Walk   Phase Initial Initial    Distance 1266 feet 832 feet    Walk Time 6 minutes 6 minutes    # of Rest Breaks 0 0    MPH 2.39 1.57    METS 2.76 1.4    RPE 11 13    VO2 Peak 9.66 4.9    Symptoms No Yes (comment)    Comments  - SOB, short run of V tach and 5 minute run of Sinus Tachy. Cardiologist notified    Resting HR 59 bpm 100 bpm    Resting BP 124/80 110/78    Max Ex. HR 121 bpm 122 bpm    Max Ex. BP 152/86 140/80    2 Minute Post BP 146/91  rchk 112 76 150/90       Initial Exercise Prescription:     Initial Exercise Prescription - 10/10/15 1400      Date of Initial Exercise RX and Referring Provider   Date 10/10/15   Referring Provider Glenetta Hew MD     Recumbant Bike   Level 1   Watts --   Minutes 10   METs 1.5     NuStep   Level 1   Minutes 20   METs 1.4     Arm Ergometer   Level --   Watts --   Minutes --   METs --     Prescription Details   Frequency (times per week) 3   Duration Progress to 30 minutes of continuous aerobic without signs/symptoms of physical distress     Intensity   THRR 40-80% of Max Heartrate 59-118   Ratings of Perceived Exertion 11-13   Perceived Dyspnea 0-4      Progression   Progression Continue to progress workloads to maintain intensity without signs/symptoms of physical distress.     Resistance Training   Training Prescription Yes   Weight 1lb   Reps 10-12      Perform Capillary Blood Glucose checks  as needed.  Exercise Prescription Changes:     Exercise Prescription Changes    Row Name 10/25/15 1400             Exercise Review   Progression Yes         Response to Exercise   Blood Pressure (Admit) 124/80       Blood Pressure (Exercise) 124/80       Blood Pressure (Exit) 124/80       Heart Rate (Admit) 91 bpm       Heart Rate (Exercise) 108 bpm       Heart Rate (Exit) 101 bpm       Rating of Perceived Exertion (Exercise) 12       Symptoms none       Duration Progress to 30 minutes of continuous aerobic without signs/symptoms of physical distress       Intensity THRR unchanged         Progression   Progression Continue to progress workloads to maintain intensity without signs/symptoms of physical distress.       Average METs 2.4         Resistance Training   Training Prescription Yes       Weight 1lb       Reps 10-12         Interval Training   Interval Training No         Recumbant Bike   Level 2       Minutes 10       METs -         NuStep   Level 3       Minutes 10       METs 2.4         Track   Laps 8       Minutes 1       METs 2.39          Exercise Comments:     Exercise Comments    Row Name 10/25/15 1353           Exercise Comments participant is walking 10 minutes, 3 days per week at home.          Discharge Exercise Prescription (Final Exercise Prescription Changes):     Exercise Prescription Changes - 10/25/15 1400      Exercise Review   Progression Yes     Response to Exercise   Blood Pressure (Admit) 124/80   Blood Pressure (Exercise) 124/80   Blood Pressure (Exit) 124/80   Heart Rate (Admit) 91 bpm   Heart Rate (Exercise) 108 bpm   Heart Rate (Exit) 101 bpm   Rating  of Perceived Exertion (Exercise) 12   Symptoms none   Duration Progress to 30 minutes of continuous aerobic without signs/symptoms of physical distress   Intensity THRR unchanged     Progression   Progression Continue to progress workloads to maintain intensity without signs/symptoms of physical distress.   Average METs 2.4     Resistance Training   Training Prescription Yes   Weight 1lb   Reps 10-12     Interval Training   Interval Training No     Recumbant Bike   Level 2   Minutes 10   METs --     NuStep   Level 3   Minutes 10   METs 2.4     Track   Laps 8   Minutes 1   METs 2.39      Nutrition:    Target Goals: Understanding of nutrition guidelines, daily intake of sodium <1500mg, cholesterol <200mg, calories 30% from fat and 7% or less from saturated fats, daily to have 5 or more servings of fruits and vegetables.  Biometrics:     Pre Biometrics - 10/10/15 1449      Pre Biometrics   Waist Circumference 53.5 inches   Hip Circumference 58 inches   Waist to Hip Ratio 0.92 %   Triceps Skinfold 38 mm   % Body Fat 42.9 %   Grip Strength 33 kg   Flexibility 0 in   Single Leg Stand 0 seconds       Nutrition Therapy Plan and Nutrition Goals:     Nutrition Therapy & Goals - 10/11/15 1357      Nutrition Therapy   Diet Therapeutic Lifestyle Changes     Personal Nutrition Goals   Personal Goal #1 1-2 lb wt loss/week to a wt loss goal of 6-24 lb at graduation from Cardiac Rehab     Intervention Plan   Intervention Prescribe, educate and counsel regarding individualized specific dietary modifications aiming towards targeted core components such as weight, hypertension, lipid management, diabetes, heart failure and other comorbidities.   Expected Outcomes Short Term Goal: Understand basic principles of dietary content, such as calories, fat, sodium, cholesterol and nutrients.;Long Term Goal: Adherence to prescribed nutrition plan.      Nutrition Discharge:  Nutrition Scores:   Nutrition Goals Re-Evaluation:   Psychosocial: Target Goals: Acknowledge presence or absence of depression, maximize coping skills, provide positive support system. Participant is able to verbalize types and ability to use techniques and skills needed for reducing stress and depression.  Initial Review & Psychosocial Screening:     Initial Psych Review & Screening - 10/10/15 1440      Family Dynamics   Good Support System? Yes     Barriers   Psychosocial barriers to participate in program The patient should benefit from training in stress management and relaxation.     Screening Interventions   Interventions Encouraged to exercise      Quality of Life Scores:     Quality of Life - 10/16/15 1221      Quality of Life Scores   Family Pre --  Daughter is a quadraplegic. Receives around the clock care. Son just got out of rehab for opiod abuse.   GLOBAL Pre --  Mr Clendednin denies being depressed a t this time. QOL reviewed with the patient      PHQ-9: Recent Review Flowsheet Data    Depression screen PHQ 2/9 10/16/2015   Decreased Interest 0   Down, Depressed, Hopeless 0   PHQ - 2 Score 0      Psychosocial Evaluation and Intervention:   Psychosocial Re-Evaluation:     Psychosocial Re-Evaluation    Row Name 10/31/15 1624             Psychosocial Re-Evaluation   Interventions Encouraged to attend Cardiac Rehabilitation for the exercise       Continued Psychosocial Services Needed No          Vocational Rehabilitation: Provide vocational rehab assistance to qualifying candidates.   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      Education: Education Goals: Education classes will be   provided on a weekly basis,  covering required topics. Participant will state understanding/return demonstration of topics presented.  Learning Barriers/Preferences:     Learning Barriers/Preferences - 10/10/15 1443      Learning Barriers/Preferences   Learning Preferences Audio;Computer/Internet;Group Instruction;Individual Instruction;Verbal Instruction      Education Topics: Count Your Pulse:  -Group instruction provided by verbal instruction, demonstration, patient participation and written materials to support subject.  Instructors address importance of being able to find your pulse and how to count your pulse when at home without a heart monitor.  Patients get hands on experience counting their pulse with staff help and individually.   Heart Attack, Angina, and Risk Factor Modification:  -Group instruction provided by verbal instruction, video, and written materials to support subject.  Instructors address signs and symptoms of angina and heart attacks.    Also discuss risk factors for heart disease and how to make changes to improve heart health risk factors.   Functional Fitness:  -Group instruction provided by verbal instruction, demonstration, patient participation, and written materials to support subject.  Instructors address safety measures for doing things around the house.  Discuss how to get up and down off the floor, how to pick things up properly, how to safely get out of a chair without assistance, and balance training.   Meditation and Mindfulness:  -Group instruction provided by verbal instruction, patient participation, and written materials to support subject.  Instructor addresses importance of mindfulness and meditation practice to help reduce stress and improve awareness.  Instructor also leads participants through a meditation exercise.    Stretching for Flexibility and Mobility:  -Group instruction provided by verbal instruction, patient participation, and written materials to support  subject.  Instructors lead participants through series of stretches that are designed to increase flexibility thus improving mobility.  These stretches are additional exercise for major muscle groups that are typically performed during regular warm up and cool down.   Hands Only CPR Anytime:  -Group instruction provided by verbal instruction, video, patient participation and written materials to support subject.  Instructors co-teach with AHA video for hands only CPR.  Participants get hands on experience with mannequins.   Nutrition I class: Heart Healthy Eating:  -Group instruction provided by PowerPoint slides, verbal discussion, and written materials to support subject matter. The instructor gives an explanation and review of the Therapeutic Lifestyle Changes diet recommendations, which includes a discussion on lipid goals, dietary fat, sodium, fiber, plant stanol/sterol esters, sugar, and the components of a well-balanced, healthy diet. Flowsheet Row CARDIAC REHAB PHASE II ORIENTATION from 10/10/2015 in [facility] C[facility]  Date  10/10/15  Educator  RD  Instruction Review Code  Not applicable [Class handouts given]      Nutrition II class: Lifestyle Skills:  -Group instruction provided by PowerPoint slides, verbal discussion, and written materials to support subject matter. The instructor gives an explanation and review of label reading, grocery shopping for heart health, heart healthy recipe modifications, and ways to make healthier choices when eating out. Flowsheet Row CARDIAC REHAB PHASE II ORIENTATION from 10/10/2015 in [facility]  Date  10/10/15  Educator  RD  Instruction Review Code  Not applicable [class handouts given]      Diabetes Question & Answer:  -Group instruction provided by PowerPoint slides, verbal discussion, and written materials to support subject matter. The instructor gives an explanation and review of  diabetes co-morbidities, pre- and post-prandial blood glucose goals, pre-exercise blood glucose goals,   signs, symptoms, and treatment of hypoglycemia and hyperglycemia, and foot care basics.   Diabetes Blitz:  -Group instruction provided by PowerPoint slides, verbal discussion, and written materials to support subject matter. The instructor gives an explanation and review of the physiology behind type 1 and type 2 diabetes, diabetes medications and rational behind using different medications, pre- and post-prandial blood glucose recommendations and Hemoglobin A1c goals, diabetes diet, and exercise including blood glucose guidelines for exercising safely.    Portion Distortion:  -Group instruction provided by PowerPoint slides, verbal discussion, written materials, and food models to support subject matter. The instructor gives an explanation of serving size versus portion size, changes in portions sizes over the last 20 years, and what consists of a serving from each food group.   Stress Management:  -Group instruction provided by verbal instruction, video, and written materials to support subject matter.  Instructors review role of stress in heart disease and how to cope with stress positively.   Flowsheet Row CARDIAC REHAB PHASE II EXERCISE from 10/16/2015 in [facility]  Date  10/16/15  Instruction Review Code  2- meets goals/outcomes      Exercising on Your Own:  -Group instruction provided by verbal instruction, power point, and written materials to support subject.  Instructors discuss benefits of exercise, components of exercise, frequency and intensity of exercise, and end points for exercise.  Also discuss use of nitroglycerin and activating EMS.  Review options of places to exercise outside of rehab.  Review guidelines for sex with heart disease.   Cardiac Drugs I:  -Group instruction provided by verbal instruction and written materials to support  subject.  Instructor reviews cardiac drug classes: antiplatelets, anticoagulants, beta blockers, and statins.  Instructor discusses reasons, side effects, and lifestyle considerations for each drug class.   Cardiac Drugs II:  -Group instruction provided by verbal instruction and written materials to support subject.  Instructor reviews cardiac drug classes: angiotensin converting enzyme inhibitors (ACE-I), angiotensin II receptor blockers (ARBs), nitrates, and calcium channel blockers.  Instructor discusses reasons, side effects, and lifestyle considerations for each drug class.   Anatomy and Physiology of the Circulatory System:  -Group instruction provided by verbal instruction, video, and written materials to support subject.  Reviews functional anatomy of heart, how it relates to various diagnoses, and what role the heart plays in the overall system.   Knowledge Questionnaire Score:     Knowledge Questionnaire Score - 10/10/15 1415      Knowledge Questionnaire Score   Pre Score 19/24      Core Components/Risk Factors/Patient Goals at Admission:     Personal Goals and Risk Factors at Admission - 10/10/15 1450      Core Components/Risk Factors/Patient Goals on Admission   Heart Failure Yes   Intervention Provide a combined exercise and nutrition program that is supplemented with education, support and counseling about heart failure. Directed toward relieving symptoms such as shortness of breath, decreased exercise tolerance, and extremity edema.   Expected Outcomes Improve functional capacity of life;Short term: Daily weights obtained and reported for increase. Utilizing diuretic protocols set by physician.;Long term: Adoption of self-care skills and reduction of barriers for early signs and symptoms recognition and intervention leading to self-care maintenance.;Short term: Attendance in program 2-3 days a week with increased exercise capacity. Reported lower sodium intake. Reported  increased fruit and vegetable intake. Reports medication compliance.      Core Components/Risk Factors/Patient Goals Review:      Goals and Risk  Factor Review    Row Name 10/25/15 1354             Core Components/Risk Factors/Patient Goals Review   Personal Goals Review (P)  Sedentary       Review (P)  Participant is off to a good start with exercise. Increasing workloads as tolerated.          Core Components/Risk Factors/Patient Goals at Discharge (Final Review):      Goals and Risk Factor Review - 10/25/15 1354      Core Components/Risk Factors/Patient Goals Review   Personal Goals Review (P)  Sedentary   Review (P)  Participant is off to a good start with exercise. Increasing workloads as tolerated.      ITP Comments:     ITP Comments    Row Name 10/10/15 0901 10/25/15 1108 10/25/15 1139       ITP Comments Dr. Fransico Him, Medical Director Pt attended Taking the high out of high blood pressure 10/25/15 attended Hypertension education video/lecture class, met outcomes/goals        Comments:Joangel is making expected progress toward personal goals after completing 6sessions. Recommend continued exercise and life style modification education including  stress management and relaxation techniques to decrease cardiac risk profile. Demarquez is enjoying participation in phase 2 cardiac rehab.Barnet Pall, RN,BSN 10/31/2015 4:27 PM

## 2015-11-01 ENCOUNTER — Encounter (HOSPITAL_COMMUNITY)
Admission: RE | Admit: 2015-11-01 | Discharge: 2015-11-01 | Disposition: A | Discharge status: Home / Self Care | Payer: Medicare Other | Source: Ambulatory Visit | Attending: Cardiology | Admitting: Cardiology

## 2015-11-01 DIAGNOSIS — I2111 ST elevation (STEMI) myocardial infarction involving right coronary artery: Secondary | ICD-10-CM

## 2015-11-01 DIAGNOSIS — I213 ST elevation (STEMI) myocardial infarction of unspecified site: Secondary | ICD-10-CM | POA: Diagnosis not present

## 2015-11-01 DIAGNOSIS — I2129 ST elevation (STEMI) myocardial infarction involving other sites: Secondary | ICD-10-CM | POA: Diagnosis not present

## 2015-11-01 DIAGNOSIS — I491 Atrial premature depolarization: Secondary | ICD-10-CM | POA: Diagnosis not present

## 2015-11-01 DIAGNOSIS — Z955 Presence of coronary angioplasty implant and graft: Secondary | ICD-10-CM

## 2015-11-04 ENCOUNTER — Encounter (HOSPITAL_COMMUNITY)
Admission: RE | Admit: 2015-11-04 | Discharge: 2015-11-04 | Disposition: A | Discharge status: Home / Self Care | Payer: Medicare Other | Source: Ambulatory Visit | Attending: Cardiology | Admitting: Cardiology

## 2015-11-04 ENCOUNTER — Other Ambulatory Visit: Payer: Self-pay | Admitting: Family Medicine

## 2015-11-04 DIAGNOSIS — I2129 ST elevation (STEMI) myocardial infarction involving other sites: Secondary | ICD-10-CM | POA: Diagnosis not present

## 2015-11-04 DIAGNOSIS — I213 ST elevation (STEMI) myocardial infarction of unspecified site: Secondary | ICD-10-CM | POA: Diagnosis not present

## 2015-11-04 DIAGNOSIS — I2111 ST elevation (STEMI) myocardial infarction involving right coronary artery: Secondary | ICD-10-CM

## 2015-11-04 DIAGNOSIS — I491 Atrial premature depolarization: Secondary | ICD-10-CM | POA: Diagnosis not present

## 2015-11-04 DIAGNOSIS — Z955 Presence of coronary angioplasty implant and graft: Secondary | ICD-10-CM

## 2015-11-04 NOTE — Progress Notes (Signed)
Leonard [name] 70 y.o. male Nutrition Note Spoke with pt. Nutrition Plan and Nutrition Survey goals reviewed with pt. Pt is following Step 2 of the Therapeutic Lifestyle Changes diet. Pt wants to lose wt. Pt has been trying to lose wt by "trying to eat healthier and exercise." Pt states he has never followed a formal wt loss program/diet.  Wt loss tips reviewed. Pt is aware of pre-diabetes dx. Pt with dx of CHF. Per discussion, pt does not use canned/convenience foods. Pt watches sodium on food labels and eats out infrequently. Pt expressed understanding of the information reviewed. Pt aware of nutrition education classes offered and plans on attending nutrition classes.  Lab Results  Component Value Date   HGBA1C 6.1 (H) 08/12/2015   Wt Readings from Last 3 Encounters:  10/14/15 295 lb 12.8 oz (134.2 kg)  10/10/15 297 lb 2.9 oz (134.8 kg)  08/27/15 300 lb (136.1 kg)    Nutrition Diagnosis ? Food-and nutrition-related knowledge deficit related to lack of exposure to information as related to diagnosis of: ? CVD ? DM ? Obesity related to excessive energy intake as evidenced by a BMI of 41.8  Nutrition Intervention ? Pt's individual nutrition plan reviewed with pt. ? Benefits of adopting Therapeutic Lifestyle Changes discussed when Medficts reviewed. ? Pt to attend the Portion Distortion class ? Pt to attend the Diabetes Q & A class ? Pt to attend the   ? Nutrition I class                     ? Nutrition II class ? Continue client-centered nutrition education by RD, as part of interdisciplinary care. Goal(s) ? Pt to identify food quantities necessary to achieve weight loss of 6-24 lb (2.7-10.9 kg) at graduation from cardiac rehab.  ? Pt able to name foods that affect blood glucose  Monitor and Evaluate progress toward nutrition goal with team. Derek Mound, M.Ed, RD, LDN, CDE 11/04/2015 10:44 AM

## 2015-11-06 ENCOUNTER — Encounter (HOSPITAL_COMMUNITY)
Admission: RE | Admit: 2015-11-06 | Discharge: 2015-11-06 | Disposition: A | Discharge status: Home / Self Care | Payer: Medicare Other | Source: Ambulatory Visit | Attending: Cardiology | Admitting: Cardiology

## 2015-11-06 DIAGNOSIS — Z955 Presence of coronary angioplasty implant and graft: Secondary | ICD-10-CM | POA: Diagnosis not present

## 2015-11-06 DIAGNOSIS — I491 Atrial premature depolarization: Secondary | ICD-10-CM | POA: Diagnosis not present

## 2015-11-06 DIAGNOSIS — I2111 ST elevation (STEMI) myocardial infarction involving right coronary artery: Secondary | ICD-10-CM

## 2015-11-06 DIAGNOSIS — I2129 ST elevation (STEMI) myocardial infarction involving other sites: Secondary | ICD-10-CM | POA: Diagnosis not present

## 2015-11-06 DIAGNOSIS — Z95810 Presence of automatic (implantable) cardiac defibrillator: Secondary | ICD-10-CM

## 2015-11-06 DIAGNOSIS — I213 ST elevation (STEMI) myocardial infarction of unspecified site: Secondary | ICD-10-CM | POA: Diagnosis not present

## 2015-11-08 ENCOUNTER — Encounter (HOSPITAL_COMMUNITY): Payer: Medicare Other

## 2015-11-11 ENCOUNTER — Telehealth (HOSPITAL_COMMUNITY): Payer: Self-pay | Admitting: *Deleted

## 2015-11-11 ENCOUNTER — Encounter (HOSPITAL_COMMUNITY): Admission: RE | Admit: 2015-11-11 | Payer: Medicare Other | Source: Ambulatory Visit

## 2015-11-13 ENCOUNTER — Encounter (HOSPITAL_COMMUNITY): Payer: Medicare Other

## 2015-11-15 ENCOUNTER — Encounter (HOSPITAL_COMMUNITY): Payer: Medicare Other

## 2015-11-18 ENCOUNTER — Encounter (HOSPITAL_COMMUNITY): Payer: Medicare Other

## 2015-11-20 ENCOUNTER — Encounter (HOSPITAL_COMMUNITY): Payer: Medicare Other

## 2015-11-21 ENCOUNTER — Telehealth (HOSPITAL_COMMUNITY): Payer: Self-pay | Admitting: *Deleted

## 2015-11-22 ENCOUNTER — Encounter (HOSPITAL_COMMUNITY): Payer: Medicare Other

## 2015-11-25 ENCOUNTER — Encounter (HOSPITAL_COMMUNITY)
Admission: RE | Admit: 2015-11-25 | Discharge: 2015-11-25 | Disposition: A | Discharge status: Home / Self Care | Payer: Medicare Other | Source: Ambulatory Visit | Attending: Cardiology | Admitting: Cardiology

## 2015-11-25 DIAGNOSIS — I491 Atrial premature depolarization: Secondary | ICD-10-CM | POA: Diagnosis not present

## 2015-11-25 DIAGNOSIS — Z955 Presence of coronary angioplasty implant and graft: Secondary | ICD-10-CM | POA: Insufficient documentation

## 2015-11-25 DIAGNOSIS — I213 ST elevation (STEMI) myocardial infarction of unspecified site: Secondary | ICD-10-CM | POA: Insufficient documentation

## 2015-11-25 DIAGNOSIS — I2129 ST elevation (STEMI) myocardial infarction involving other sites: Secondary | ICD-10-CM | POA: Insufficient documentation

## 2015-11-25 DIAGNOSIS — Z95810 Presence of automatic (implantable) cardiac defibrillator: Secondary | ICD-10-CM

## 2015-11-25 DIAGNOSIS — I2111 ST elevation (STEMI) myocardial infarction involving right coronary artery: Secondary | ICD-10-CM

## 2015-11-27 ENCOUNTER — Encounter (HOSPITAL_COMMUNITY)
Admission: RE | Admit: 2015-11-27 | Discharge: 2015-11-27 | Disposition: A | Discharge status: Home / Self Care | Payer: Medicare Other | Source: Ambulatory Visit | Attending: Cardiology | Admitting: Cardiology

## 2015-11-27 DIAGNOSIS — I2129 ST elevation (STEMI) myocardial infarction involving other sites: Secondary | ICD-10-CM | POA: Diagnosis not present

## 2015-11-27 DIAGNOSIS — Z955 Presence of coronary angioplasty implant and graft: Secondary | ICD-10-CM | POA: Diagnosis not present

## 2015-11-27 DIAGNOSIS — I213 ST elevation (STEMI) myocardial infarction of unspecified site: Secondary | ICD-10-CM | POA: Diagnosis not present

## 2015-11-27 DIAGNOSIS — Z95810 Presence of automatic (implantable) cardiac defibrillator: Secondary | ICD-10-CM

## 2015-11-27 DIAGNOSIS — I491 Atrial premature depolarization: Secondary | ICD-10-CM | POA: Diagnosis not present

## 2015-11-27 DIAGNOSIS — I2111 ST elevation (STEMI) myocardial infarction involving right coronary artery: Secondary | ICD-10-CM

## 2015-11-27 NOTE — Progress Notes (Signed)
Cardiac Individual Treatment Plan  Patient Details  Name: Leonard [name] MRN: [id number] Date of Birth: [birth year] Referring Provider:   Flowsheet Row CARDIAC REHAB PHASE II ORIENTATION from 10/10/2015 in [place]  Referring Provider  Glenetta Hew MD      Initial Encounter Date:  [place] PHASE II ORIENTATION from 10/10/2015 in [place]  Date  10/10/15  Referring Provider  Glenetta Hew MD      Visit Diagnosis: 08/12/15 ST elevation myocardial infarction involving right coronary artery (HCC)  S/P coronary artery stent placement  S/P ICD (internal cardiac defibrillator)08/26/2015  Patient's Home Medications on Admission:  Current Outpatient Prescriptions:  .  amLODipine (NORVASC) 5 MG tablet, TAKE 1 TABLET BY MOUTH DAILY, Disp: 90 tablet, Rfl: 0 .  aspirin EC 81 MG tablet, Take 1 tablet (81 mg total) by mouth daily., Disp: , Rfl:  .  atorvastatin (LIPITOR) 80 MG tablet, Take 1 tablet (80 mg total) by mouth daily at 6 PM., Disp: 30 tablet, Rfl: 12 .  cholecalciferol (VITAMIN D) 1000 units tablet, Take 2,000 Units by mouth daily. , Disp: , Rfl:  .  Coenzyme Q10 (COQ10) 200 MG CAPS, Take 200 mg by mouth daily., Disp: , Rfl:  .  irbesartan (AVAPRO) 150 MG tablet, Take 1 tablet (150 mg total) by mouth daily., Disp: 30 tablet, Rfl: 12 .  ketotifen (ZADITOR) 0.025 % ophthalmic solution, Place 1 drop into both eyes as needed (for dry eyes)., Disp: , Rfl:  .  magnesium oxide (MAG-OX) 400 MG tablet, Take 1 tablet (400 mg total) by mouth 2 (two) times daily., Disp: 60 tablet, Rfl: 1 .  metoprolol succinate (TOPROL-XL) 50 MG 24 hr tablet, Take 1 tablet (50 mg total) by mouth daily. Take with or immediately following a meal., Disp: 30 tablet, Rfl: 6 .  Multiple Vitamin (MULTIVITAMIN) tablet, Take 1 tablet by mouth daily., Disp: , Rfl:  .  nitroGLYCERIN (NITROSTAT) 0.4 MG SL tablet, Place 1 tablet (0.4 mg  total) under the tongue every 5 (five) minutes as needed for chest pain., Disp: 25 tablet, Rfl: 2 .  Omega-3 Fatty Acids (FISH OIL) 1200 MG CPDR, Take 1 tablet by mouth daily., Disp: , Rfl:  .  tadalafil (CIALIS) 20 MG tablet, Take 20 mg by mouth daily as needed for erectile dysfunction., Disp: , Rfl:  .  ticagrelor (BRILINTA) 90 MG TABS tablet, Take 1 tablet (90 mg total) by mouth 2 (two) times daily., Disp: 60 tablet, Rfl: 12 .  venlafaxine (EFFEXOR) 75 MG tablet, TAKE 1 TABLET BY MOUTH EVERY DAY, Disp: 90 tablet, Rfl: 0  Past Medical History: Past Medical History:  Diagnosis Date  . Anxiety   . Atrial flutter (HCC)    a. CHA2DS2VASc = 4.  [name] CAD (coronary artery disease)    a. 07/2015 Posterior STEMI/PCI: LM nl, LAd 40ost, RI 40, RCA 186m(3.0x22 Resolute Integrity DES, 3.0x12 Resolute Integrity DES), 90d (PTCA), RPDA small, nl, RPLB2 100 - too small for PTCA.  .Marland KitchenChronic combined systolic and diastolic CHF (congestive heart failure) ([facility]    a. 07/2015 Ehco: EF 45-50%, basal-mid inf and infsept AK, inflat, apical inf, and apical septal HK, Gr1 DD, triv MR.  .Marland KitchenHyperlipidemia   . Hypertensive heart disease   . Ischemic cardiomyopathy    a. 07/2015 Echo: EF 45-50%.  . Morbid obesity ([facility]     Tobacco Use: History  Smoking Status  . Never Smoker  Smokeless  Tobacco  . Never Used    Labs: Recent Review Flowsheet Data    Labs for ITP Cardiac and Pulmonary Rehab Latest Ref Rng & Units 04/18/2015 08/12/2015   Cholestrol 0 - 200 mg/dL - 163   LDLCALC 0 - 99 mg/dL - 98   HDL >40 mg/dL - 41   Trlycerides <150 mg/dL - 122   Hemoglobin A1c 4.8 - 5.6 % 6.1(H) 6.1(H)   TCO2 0 - 100 mmol/L - 24      Capillary Blood Glucose: No results found for: GLUCAP   Exercise Target Goals:    Exercise Program Goal: Individual exercise prescription set with THRR, safety & activity barriers. Participant demonstrates ability to understand and report RPE using BORG scale, to self-measure pulse  accurately, and to acknowledge the importance of the exercise prescription.  Exercise Prescription Goal: Starting with aerobic activity 30 plus minutes a day, 3 days per week for initial exercise prescription. Provide home exercise prescription and guidelines that participant acknowledges understanding prior to discharge.  Activity Barriers & Risk Stratification:     Activity Barriers & Cardiac Risk Stratification - 10/10/15 0906      Activity Barriers & Cardiac Risk Stratification   Activity Barriers Deconditioning;Muscular Weakness;Joint Problems;Balance Concerns  B ankle, knee and foot pain from old sport injuries   Cardiac Risk Stratification High      6 Minute Walk:     6 Minute Walk    Row Name 10/10/15 1415 10/10/15 1444       6 Minute Walk   Phase Initial Initial    Distance 1266 feet 832 feet    Walk Time 6 minutes 6 minutes    # of Rest Breaks 0 0    MPH 2.39 1.57    METS 2.76 1.4    RPE 11 13    VO2 Peak 9.66 4.9    Symptoms No Yes (comment)    Comments  - SOB, short run of V tach and 5 minute run of Sinus Tachy. Cardiologist notified    Resting HR 59 bpm 100 bpm    Resting BP 124/80 110/78    Max Ex. HR 121 bpm 122 bpm    Max Ex. BP 152/86 140/80    2 Minute Post BP 146/91  rchk 112 76 150/90       Initial Exercise Prescription:     Initial Exercise Prescription - 10/10/15 1400      Date of Initial Exercise RX and Referring Provider   Date 10/10/15   Referring Provider Glenetta Hew MD     Recumbant Bike   Level 1   Watts --   Minutes 10   METs 1.5     NuStep   Level 1   Minutes 20   METs 1.4     Arm Ergometer   Level --   Watts --   Minutes --   METs --     Prescription Details   Frequency (times per week) 3   Duration Progress to 30 minutes of continuous aerobic without signs/symptoms of physical distress     Intensity   THRR 40-80% of Max Heartrate 59-118   Ratings of Perceived Exertion 11-13   Perceived Dyspnea 0-4      Progression   Progression Continue to progress workloads to maintain intensity without signs/symptoms of physical distress.     Resistance Training   Training Prescription Yes   Weight 1lb   Reps 10-12      Perform Capillary Blood Glucose checks  as needed.  Exercise Prescription Changes:      Exercise Prescription Changes    Row Name 10/25/15 1400 11/06/15 1700 11/25/15 1700         Exercise Review   Progression Yes Yes Yes       Response to Exercise   Blood Pressure (Admit) 124/80 138/90 130/80     Blood Pressure (Exercise) 124/80 114/76 134/68     Blood Pressure (Exit) 124/80 138/78 124/78     Heart Rate (Admit) 91 bpm 95 bpm 85 bpm     Heart Rate (Exercise) 108 bpm 110 bpm 114 bpm     Heart Rate (Exit) 101 bpm 92 bpm 78 bpm     Rating of Perceived Exertion (Exercise) _0 Symptoms none none none     Duration Progress to 30 minutes of continuous aerobic without signs/symptoms of physical distress Progress to 30 minutes of continuous aerobic without signs/symptoms of physical distress Progress to 30 minutes of continuous aerobic without signs/symptoms of physical distress     Intensity THRR unchanged THRR unchanged THRR unchanged       Progression   Progression Continue to progress workloads to maintain intensity without signs/symptoms of physical distress. Continue to progress workloads to maintain intensity without signs/symptoms of physical distress. Continue to progress workloads to maintain intensity without signs/symptoms of physical distress.     Average METs 2.4 2.4 2.4       Resistance Training   Training Prescription Yes Yes Yes     Weight 1lb 1lb 5lbs     Reps 10-12 10-12 10-12       Interval Training   Interval Training No No No       Recumbant Bike   Level 2 2 -     Minutes 10 10 -     METs - 1.9 -       NuStep   Level _1 Minutes _2 METs 2.4 2.4 2.2       Track   Laps _3 Minutes _4 METs 2.39 2.39 1.84        Home Exercise Plan   Plans to continue exercise at  - Home Home     Frequency  - Add 3 additional days to program exercise sessions. Add 3 additional days to program exercise sessions.        Exercise Comments:      Exercise Comments    Row Name 10/25/15 1353 11/06/15 1708 11/25/15 1706       Exercise Comments participant is walking 10 minutes, 3 days per week at home. pt is walking 10-20 minutes, 2-3 days/week at home Pt just returnted to exercise after two weeks after having a death in the family.  Pt is eager to get back to exercising at the same level prior to his absence        Discharge Exercise Prescription (Final Exercise Prescription Changes):     Exercise Prescription Changes - 11/25/15 1700      Exercise Review   Progression Yes     Response to Exercise   Blood Pressure (Admit) 130/80   Blood Pressure (Exercise) 134/68   Blood Pressure (Exit) 124/78   Heart Rate (Admit) 85 bpm   Heart Rate (Exercise) 114 bpm   Heart Rate (Exit) 78 bpm   Rating of Perceived Exertion (Exercise) 12  Symptoms none   Duration Progress to 30 minutes of continuous aerobic without signs/symptoms of physical distress   Intensity THRR unchanged     Progression   Progression Continue to progress workloads to maintain intensity without signs/symptoms of physical distress.   Average METs 2.4     Resistance Training   Training Prescription Yes   Weight 5lbs   Reps 10-12     Interval Training   Interval Training No     Recumbant Bike   Level --   Minutes --   METs --     NuStep   Level 3   Minutes 20   METs 2.2     Track   Laps 5   Minutes 1   METs 1.84     Home Exercise Plan   Plans to continue exercise at Home   Frequency Add 3 additional days to program exercise sessions.      Nutrition:  Target Goals: Understanding of nutrition guidelines, daily intake of sodium <1564m, cholesterol <2084m calories 30% from fat and 7% or less from saturated fats, daily  to have 5 or more servings of fruits and vegetables.  Biometrics:     Pre Biometrics - 10/10/15 1449      Pre Biometrics   Waist Circumference 53.5 inches   Hip Circumference 58 inches   Waist to Hip Ratio 0.92 %   Triceps Skinfold 38 mm   % Body Fat 42.9 %   Grip Strength 33 kg   Flexibility 0 in   Single Leg Stand 0 seconds       Nutrition Therapy Plan and Nutrition Goals:     Nutrition Therapy & Goals - 10/11/15 1357      Nutrition Therapy   Diet Therapeutic Lifestyle Changes     Personal Nutrition Goals   Personal Goal #1 1-2 lb wt loss/week to a wt loss goal of 6-24 lb at graduation [facility] and counsel regarding individualized specific dietary modifications aiming towards targeted core components such as weight, hypertension, lipid management, diabetes, heart failure and other comorbidities.   Expected Outcomes Short Term Goal: Understand basic principles of dietary content, such as calories, fat, sodium, cholesterol and nutrients.;Long Term Goal: Adherence to prescribed nutrition plan.      Nutrition Discharge: Nutrition Scores:     Nutrition Assessments - 11/04/15 1048      MEDFICTS Scores   Pre Score 18      Nutrition Goals Re-Evaluation:   Psychosocial: Target Goals: Acknowledge presence or absence of depression, maximize coping skills, provide positive support system. Participant is able to verbalize types and ability to use techniques and skills needed for reducing stress and depression.  Initial Review & Psychosocial Screening:     Initial Psych Review & Screening - 10/10/15 [place]     Barriers   Psychosocial barriers to participate in program The patient should benefit from training in stress management and relaxation.     Screening Interventions   Interventions Encouraged to exercise      Quality of Life Scores:      Quality of Life - 10/16/15 1221      Quality of Life Scores   Family Pre --  Daughter is a quEnvironmental consultantReceives around the clock care. Son just got out of rehab for opiod abuse.   GLOBAL Pre --  Mr ClJacqualine Codeenies being  depressed a t this time. QOL reviewed with the patient      PHQ-9: Recent Review Flowsheet Data    Depression screen [facility] 2/9 10/16/2015   Decreased Interest 0   Down, Depressed, Hopeless 0   PHQ - 2 Score 0      Psychosocial Evaluation and Intervention:   Psychosocial Re-Evaluation:     Psychosocial Re-Evaluation    Row Name 10/31/15 1624 11/27/15 1818           Psychosocial Re-Evaluation   Interventions Encouraged to attend Cardiac Rehabilitation for the exercise Encouraged to attend Cardiac Rehabilitation for the exercise;Relaxation education      Comments  - -  Ojas's daughter passed away last week. Emotional support offered.      Continued Psychosocial Services Needed No No         Vocational Rehabilitation: Provide vocational rehab assistance to qualifying candidates.   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      Education: Education Goals: Education classes will be provided on a weekly basis, covering required topics. Participant will state understanding/return demonstration of topics presented.  Learning Barriers/Preferences:     Learning Barriers/Preferences - 10/10/15 1443      Learning Barriers/Preferences   Learning Preferences Audio;Computer/Internet;Group Instruction;Individual Instruction;Verbal Instruction      Education Topics: Count Your Pulse:  -Group instruction provided by verbal instruction, demonstration, patient participation and written materials to support subject.  Instructors address  importance of being able to find your pulse and how to count your pulse when at home without a heart monitor.  Patients get hands on experience counting their pulse with staff help and individually.   Heart Attack, Angina, and Risk Factor Modification:  -Group instruction provided by verbal instruction, video, and written materials to support subject.  Instructors address signs and symptoms of angina and heart attacks.    Also discuss risk factors for heart disease and how to make changes to improve heart health risk factors. Flowsheet Row CARDIAC REHAB PHASE II EXERCISE from 11/27/2015 in [place]  Date  11/06/15  Instruction Review Code  2- meets goals/outcomes      Functional Fitness:  -Group instruction provided by verbal instruction, demonstration, patient participation, and written materials to support subject.  Instructors address safety measures for doing things around the house.  Discuss how to get up and down off the floor, how to pick things up properly, how to safely get out of a chair without assistance, and balance training.   Meditation and Mindfulness:  -Group instruction provided by verbal instruction, patient participation, and written materials to support subject.  Instructor addresses importance of mindfulness and meditation practice to help reduce stress and improve awareness.  Instructor also leads participants through a meditation exercise.    Stretching for Flexibility and Mobility:  -Group instruction provided by verbal instruction, patient participation, and written materials to support subject.  Instructors lead participants through series of stretches that are designed to increase flexibility thus improving mobility.  These stretches are additional exercise for major muscle groups that are typically performed during regular warm up and cool down.   Hands Only CPR Anytime:  -Group instruction provided by verbal instruction, video,  patient participation and written materials to support subject.  Instructors  co-teach with AHA video for hands only CPR.  Participants get hands on experience with mannequins.   Nutrition I class: Heart Healthy Eating:  -Group instruction provided by PowerPoint slides, verbal discussion, and written materials to support subject matter. The instructor gives an explanation and review of the Therapeutic Lifestyle Changes diet recommendations, which includes a discussion on lipid goals, dietary fat, sodium, fiber, plant stanol/sterol esters, sugar, and the components of a well-balanced, healthy diet. Flowsheet Row CARDIAC REHAB PHASE II EXERCISE from 11/27/2015 in [place]  Date  10/10/15  Educator  RD  Instruction Review Code  Not applicable [Class handouts given]      Nutrition II class: Lifestyle Skills:  -Group instruction provided by PowerPoint slides, verbal discussion, and written materials to support subject matter. The instructor gives an explanation and review of label reading, grocery shopping for heart health, heart healthy recipe modifications, and ways to make healthier choices when eating out. Flowsheet Row CARDIAC REHAB PHASE II EXERCISE from 11/27/2015 in [place]  Date  10/10/15  Educator  RD  Instruction Review Code  Not applicable [class handouts given]      Diabetes Question & Answer:  -Group instruction provided by PowerPoint slides, verbal discussion, and written materials to support subject matter. The instructor gives an explanation and review of diabetes co-morbidities, pre- and post-prandial blood glucose goals, pre-exercise blood glucose goals, signs, symptoms, and treatment of hypoglycemia and hyperglycemia, and foot care basics. Flowsheet Row CARDIAC REHAB PHASE II EXERCISE from 11/27/2015 in [place]  Date  11/01/15  Educator  RD  Instruction Review Code  2- meets  goals/outcomes      Diabetes Blitz:  -Group instruction provided by PowerPoint slides, verbal discussion, and written materials to support subject matter. The instructor gives an explanation and review of the physiology behind type 1 and type 2 diabetes, diabetes medications and rational behind using different medications, pre- and post-prandial blood glucose recommendations and Hemoglobin A1c goals, diabetes diet, and exercise including blood glucose guidelines for exercising safely.    Portion Distortion:  -Group instruction provided by PowerPoint slides, verbal discussion, written materials, and food models to support subject matter. The instructor gives an explanation of serving size versus portion size, changes in portions sizes over the last 20 years, and what consists of a serving from each food group.   Stress Management:  -Group instruction provided by verbal instruction, video, and written materials to support subject matter.  Instructors review role of stress in heart disease and how to cope with stress positively.   Flowsheet Row CARDIAC REHAB PHASE II EXERCISE from 11/27/2015 in [place]  Date  10/16/15  Instruction Review Code  2- meets goals/outcomes      Exercising on Your Own:  -Group instruction provided by verbal instruction, power point, and written materials to support subject.  Instructors discuss benefits of exercise, components of exercise, frequency and intensity of exercise, and end points for exercise.  Also discuss use of nitroglycerin and activating EMS.  Review options of places to exercise outside of rehab.  Review guidelines for sex with heart disease.   Cardiac Drugs I:  -Group instruction provided by verbal instruction and written materials to support subject.  Instructor reviews cardiac drug classes: antiplatelets, anticoagulants, beta blockers, and statins.  Instructor discusses reasons, side effects, and lifestyle  considerations for each drug class.   Cardiac Drugs II:  -Group instruction provided by  verbal instruction and written materials to support subject.  Instructor reviews cardiac drug classes: angiotensin converting enzyme inhibitors (ACE-I), angiotensin II receptor blockers (ARBs), nitrates, and calcium channel blockers.  Instructor discusses reasons, side effects, and lifestyle considerations for each drug class. Flowsheet Row CARDIAC REHAB PHASE II EXERCISE from 11/27/2015 in [place]  Date  11/27/15  Educator  Clydia Llano [facility]  Instruction Review Code  2- meets goals/outcomes      Anatomy and Physiology of the Circulatory System:  -Group instruction provided by verbal instruction, video, and written materials to support subject.  Reviews functional anatomy of heart, how it relates to various diagnoses, and what role the heart plays in the overall system.   Knowledge Questionnaire Score:     Knowledge Questionnaire Score - 10/10/15 1415      Knowledge Questionnaire Score   Pre Score 19/24      Core Components/Risk Factors/Patient Goals at Admission:     Personal Goals and Risk Factors at Admission - 10/10/15 1450      Core Components/Risk Factors/Patient Goals on Admission   Heart Failure Yes   Intervention Provide a combined exercise and nutrition program that is supplemented with education, support and counseling about heart failure. Directed toward relieving symptoms such as shortness of breath, decreased exercise tolerance, and extremity edema.   Expected Outcomes Improve functional capacity of life;Short term: Daily weights obtained and reported for increase. Utilizing diuretic protocols set by physician.;Long term: Adoption of self-care skills and reduction of barriers for early signs and symptoms recognition and intervention leading to self-care maintenance.;Short term: Attendance in program 2-3 days a week with increased exercise capacity.  Reported lower sodium intake. Reported increased fruit and vegetable intake. Reports medication compliance.      Core Components/Risk Factors/Patient Goals Review:      Goals and Risk Factor Review    Row Name 10/25/15 1354 11/06/15 1708           Core Components/Risk Factors/Patient Goals Review   Personal Goals Review (P)  Sedentary Other      Review (P)  Participant is off to a good start with exercise. Increasing workloads as tolerated. Reviewed goals and home exercise program.  Pt states he feels stronger and his breathing has improved w/ exertion      Expected Outcomes  - Continue with exercise routine and increase workload as tolerated.          Core Components/Risk Factors/Patient Goals at Discharge (Final Review):      Goals and Risk Factor Review - 11/06/15 1708      Core Components/Risk Factors/Patient Goals Review   Personal Goals Review Other   Review Reviewed goals and home exercise program.  Pt states he feels stronger and his breathing has improved w/ exertion   Expected Outcomes Continue with exercise routine and increase workload as tolerated.       ITP Comments:     ITP Comments    Row Name 10/10/15 0901 10/25/15 1108 10/25/15 1139       ITP Comments Dr. Fransico Him, Medical Director Pt attended Taking the high out of high blood pressure 10/25/15 attended Hypertension education video/lecture class, met outcomes/goals        Comments: Tandy  is making expected progress toward personal goals after completing 11sessions. Azaan's daughter passed away last week unexpectedly. Emotional support provided. I offered Dorsie the [facility] as a resource. Braydon says he has been talking with his priest  Recommend continued exercise and  life style modification education including  stress management and relaxation techniques to decrease cardiac risk profile.Harrell Gave RN BSN

## 2015-11-29 ENCOUNTER — Encounter (HOSPITAL_COMMUNITY): Admission: RE | Admit: 2015-11-29 | Payer: Medicare Other | Source: Ambulatory Visit

## 2015-12-02 ENCOUNTER — Encounter (HOSPITAL_COMMUNITY)
Admission: RE | Admit: 2015-12-02 | Discharge: 2015-12-02 | Disposition: A | Discharge status: Home / Self Care | Payer: Medicare Other | Source: Ambulatory Visit | Attending: Cardiology | Admitting: Cardiology

## 2015-12-02 DIAGNOSIS — I491 Atrial premature depolarization: Secondary | ICD-10-CM | POA: Diagnosis not present

## 2015-12-02 DIAGNOSIS — I2129 ST elevation (STEMI) myocardial infarction involving other sites: Secondary | ICD-10-CM | POA: Diagnosis not present

## 2015-12-02 DIAGNOSIS — Z955 Presence of coronary angioplasty implant and graft: Secondary | ICD-10-CM | POA: Diagnosis not present

## 2015-12-02 DIAGNOSIS — I2111 ST elevation (STEMI) myocardial infarction involving right coronary artery: Secondary | ICD-10-CM

## 2015-12-02 DIAGNOSIS — Z95810 Presence of automatic (implantable) cardiac defibrillator: Secondary | ICD-10-CM

## 2015-12-02 DIAGNOSIS — I213 ST elevation (STEMI) myocardial infarction of unspecified site: Secondary | ICD-10-CM | POA: Diagnosis not present

## 2015-12-04 ENCOUNTER — Encounter (HOSPITAL_COMMUNITY)
Admission: RE | Admit: 2015-12-04 | Discharge: 2015-12-04 | Disposition: A | Discharge status: Home / Self Care | Payer: Medicare Other | Source: Ambulatory Visit | Attending: Cardiology | Admitting: Cardiology

## 2015-12-04 DIAGNOSIS — Z955 Presence of coronary angioplasty implant and graft: Secondary | ICD-10-CM | POA: Diagnosis not present

## 2015-12-04 DIAGNOSIS — I2129 ST elevation (STEMI) myocardial infarction involving other sites: Secondary | ICD-10-CM | POA: Diagnosis not present

## 2015-12-04 DIAGNOSIS — I2111 ST elevation (STEMI) myocardial infarction involving right coronary artery: Secondary | ICD-10-CM

## 2015-12-04 DIAGNOSIS — Z95810 Presence of automatic (implantable) cardiac defibrillator: Secondary | ICD-10-CM

## 2015-12-04 DIAGNOSIS — I213 ST elevation (STEMI) myocardial infarction of unspecified site: Secondary | ICD-10-CM | POA: Diagnosis not present

## 2015-12-04 DIAGNOSIS — I491 Atrial premature depolarization: Secondary | ICD-10-CM | POA: Diagnosis not present

## 2015-12-06 ENCOUNTER — Encounter (HOSPITAL_COMMUNITY)
Admission: RE | Admit: 2015-12-06 | Discharge: 2015-12-06 | Disposition: A | Discharge status: Home / Self Care | Payer: Medicare Other | Source: Ambulatory Visit | Attending: Cardiology | Admitting: Cardiology

## 2015-12-06 ENCOUNTER — Ambulatory Visit (INDEPENDENT_AMBULATORY_CARE_PROVIDER_SITE_OTHER): Payer: Medicare Other | Admitting: Internal Medicine

## 2015-12-06 ENCOUNTER — Encounter (INDEPENDENT_AMBULATORY_CARE_PROVIDER_SITE_OTHER): Payer: Self-pay

## 2015-12-06 ENCOUNTER — Encounter: Payer: Self-pay | Admitting: Internal Medicine

## 2015-12-06 VITALS — BP 136/84 | HR 89 | Ht 73.0 in | Wt 298.8 lb

## 2015-12-06 DIAGNOSIS — I2589 Other forms of chronic ischemic heart disease: Secondary | ICD-10-CM | POA: Diagnosis not present

## 2015-12-06 DIAGNOSIS — I2111 ST elevation (STEMI) myocardial infarction involving right coronary artery: Secondary | ICD-10-CM

## 2015-12-06 DIAGNOSIS — I2129 ST elevation (STEMI) myocardial infarction involving other sites: Secondary | ICD-10-CM | POA: Diagnosis not present

## 2015-12-06 DIAGNOSIS — I472 Ventricular tachycardia, unspecified: Secondary | ICD-10-CM

## 2015-12-06 DIAGNOSIS — I255 Ischemic cardiomyopathy: Secondary | ICD-10-CM

## 2015-12-06 DIAGNOSIS — I5042 Chronic combined systolic (congestive) and diastolic (congestive) heart failure: Secondary | ICD-10-CM

## 2015-12-06 DIAGNOSIS — Z95810 Presence of automatic (implantable) cardiac defibrillator: Secondary | ICD-10-CM

## 2015-12-06 DIAGNOSIS — Z955 Presence of coronary angioplasty implant and graft: Secondary | ICD-10-CM

## 2015-12-06 DIAGNOSIS — I491 Atrial premature depolarization: Secondary | ICD-10-CM | POA: Diagnosis not present

## 2015-12-06 DIAGNOSIS — I213 ST elevation (STEMI) myocardial infarction of unspecified site: Secondary | ICD-10-CM | POA: Diagnosis not present

## 2015-12-06 LAB — CUP PACEART INCLINIC DEVICE CHECK
Brady Statistic RV Percent Paced: 0 %
Date Time Interrogation Session: 20171117132124
HIGH POWER IMPEDANCE MEASURED VALUE: 69.75 Ohm
Implantable Lead Implant Date: 20170807
Implantable Lead Location: 753860
Implantable Pulse Generator Implant Date: 20170807
Lead Channel Pacing Threshold Amplitude: 0.75 V
Lead Channel Pacing Threshold Pulse Width: 0.5 ms
Lead Channel Sensing Intrinsic Amplitude: 11.6 mV
Lead Channel Setting Sensing Sensitivity: 0.5 mV
MDC IDC MSMT LEADCHNL RV IMPEDANCE VALUE: 487.5 Ohm
MDC IDC MSMT LEADCHNL RV PACING THRESHOLD AMPLITUDE: 0.75 V
MDC IDC MSMT LEADCHNL RV PACING THRESHOLD PULSEWIDTH: 0.5 ms
MDC IDC SET LEADCHNL RV PACING AMPLITUDE: 2.5 V
MDC IDC SET LEADCHNL RV PACING PULSEWIDTH: 0.5 ms
Pulse Gen Serial Number: 7377761

## 2015-12-06 NOTE — Patient Instructions (Signed)
Medication Instructions: - Your physician recommends that you continue on your current medications as directed. Please refer to the Current Medication list given to you today.  Labwork: - Your physician recommends that you have lab work today: BMP/ Magnesium  Procedures/Testing: - none ordered  Follow-Up: - Your physician recommends that you schedule a follow-up appointment in: 3 months with Chanetta Marshall, NP for Dr. Caryl Comes  - Your physician wants you to follow-up in: 6 months with Dr. Caryl Comes. You will receive a reminder letter in the mail two months in advance. If you don't receive a letter, please call our office to schedule the follow-up appointment.    Any Additional Special Instructions Will Be Listed Below (If Applicable).     If you need a refill on your cardiac medications before your next appointment, please call your pharmacy.

## 2015-12-06 NOTE — Progress Notes (Signed)
Patient Care Team: Girtha Rm, NP as PCP - General (Family Medicine)   HPI  Leonard [name] is a 70 y.o. male Seen in follow-up for ventricular tachycardia and ICD implantation for secondary prevention 8/17. He had presented in the context of ischemic heart disease prior PCI with sustained wide complex tachycardia and was cardioverted. 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  9/17 Cr 0.93 K4.4 mag 1.5  Cath 8/17 Inferobasal and mid inferior wall akinesis. Elevated end-diastolic pressure is moderately elevated at 24 mmHg. Estimated ejection fraction 35-40%.  Widely patent right coronary stents, recently implanted late July 2017.  Ostial 50-60% LAD   Records and Results Reviewed As above  He tells me that his daughter died on Halloween. She had a diving accident at the age of 80 and been quadriplegic for 25 years. She had been incredibly vigorous girl. He showed me her high school graduation picture.She was as he reported beautiful.  Her name was Leonard [name] Past Medical History:  Diagnosis Date  . Anxiety   . Atrial flutter (HCC)    a. CHA2DS2VASc = 4.  [name] CAD (coronary artery disease)    a. 07/2015 Posterior STEMI/PCI: LM nl, LAd 40ost, RI 40, RCA 133m (3.0x22 Resolute Integrity DES, 3.0x12 Resolute Integrity DES), 90d (PTCA), RPDA small, nl, RPLB2 100 - too small for PTCA.  [name] Chronic combined systolic and diastolic CHF (congestive heart failure) ([place])    a. 07/2015 Ehco: EF 45-50%, basal-mid inf and infsept AK, inflat, apical inf, and apical septal HK, Gr1 DD, triv MR.  [name] Hyperlipidemia   . Hypertensive heart disease   . Ischemic cardiomyopathy    a. 07/2015 Echo: EF 45-50%.  . Morbid obesity ([place])     Past Surgical History:  Procedure Laterality Date  . CARDIAC CATHETERIZATION N/A 08/12/2015   Procedure: Left Heart  Cath and Coronary Angiography;  Surgeon: Leonie Man, MD;  Location: [facility];  Service: Cardiovascular;  Laterality: N/A;  . CARDIAC CATHETERIZATION N/A 08/26/2015   Procedure: Left Heart Cath and Coronary Angiography;  Surgeon: Belva Crome, MD;  Location: [facility];  Service: Cardiovascular;  Laterality: N/A;  . CARDIOVERSION N/A 08/23/2015   Procedure: CARDIOVERSION;  Surgeon: Deboraha Sprang, MD;  Location: [facility];  Service: Cardiovascular;  Laterality: N/A;  . CORONARY ANGIOPLASTY    . ELECTROPHYSIOLOGIC STUDY N/A 08/26/2015   Procedure: Electrophysiology Study;  Surgeon: Deboraha Sprang, MD;  Location: [facility];  Service: Cardiovascular;  Laterality: N/A;  . EP IMPLANTABLE DEVICE N/A 08/26/2015   Procedure: ICD Implant;  Surgeon: Deboraha Sprang, MD;  Location: [facility];  Service: Cardiovascular;  Laterality: N/A;  . HEMORRHOID SURGERY    . SKIN SURGERY      Current Outpatient Prescriptions  Medication Sig Dispense Refill  . amLODipine (NORVASC) 5 MG tablet TAKE 1 TABLET BY MOUTH DAILY 90 tablet 0  . aspirin EC 81 MG tablet Take 1 tablet (81 mg total) by mouth daily.    [name] atorvastatin (LIPITOR) 80 MG tablet Take 1 tablet (80 mg total) by mouth daily at 6 PM. 30 tablet 12  . cholecalciferol (VITAMIN D) 1000 units tablet Take 2,000 Units by mouth daily.     . Coenzyme Q10 (COQ10) 200 MG CAPS Take 200 mg by mouth daily.    . irbesartan (  AVAPRO) 150 MG tablet Take 1 tablet (150 mg total) by mouth daily. 30 tablet 12  . ketotifen (ZADITOR) 0.025 % ophthalmic solution Place 1 drop into both eyes as needed (for dry eyes).    . magnesium oxide (MAG-OX) 400 MG tablet Take 1 tablet (400 mg total) by mouth 2 (two) times daily. 60 tablet 1  . metoprolol succinate (TOPROL-XL) 50 MG 24 hr tablet Take 1 tablet (50 mg total) by mouth daily. Take with or immediately following a meal. 30 tablet 6  . Multiple Vitamin (MULTIVITAMIN) tablet Take 1 tablet by mouth daily.      . nitroGLYCERIN (NITROSTAT) 0.4 MG SL tablet Place 1 tablet (0.4 mg total) under the tongue every 5 (five) minutes as needed for chest pain. 25 tablet 2  . Omega-3 Fatty Acids (FISH OIL) 1200 MG CPDR Take 1 tablet by mouth daily.    . tadalafil (CIALIS) 20 MG tablet Take 20 mg by mouth daily as needed for erectile dysfunction.    . ticagrelor (BRILINTA) 90 MG TABS tablet Take 1 tablet (90 mg total) by mouth 2 (two) times daily. 60 tablet 12  . venlafaxine (EFFEXOR) 75 MG tablet TAKE 1 TABLET BY MOUTH EVERY DAY 90 tablet 0   No current facility-administered medications for this visit.     Allergies  Allergen Reactions  . Other Itching    Ragweed causes watery eyes, itching, sneezing  . Sulfa Antibiotics Other (See Comments)    Pt unsure of reaction, mother told him he was allergic       Review of Systems negative except from HPI and PMH  Physical Exam BP 136/84   Pulse 89   Ht 6\' 1"  (1.854 m)   Wt 298 lb 12.8 oz (135.5 kg)   SpO2 95%   BMI 39.42 kg/m  Well developed and well nourished in no acute distress HENT normal E scleral and icterus clear Neck Supple Device pocket well healed; without hematoma or erythema.  There is no tethering  JVP flat; carotids brisk and full Clear to ausculation  Regular rate and rhythm, no murmurs gallops or rub Soft with active bowel sounds No clubbing cyanosis Trace Edema Violaceous discoloration without warmth Alert and oriented, grossly normal motor and sensory function Skin Warm and Dry  Sinus rhythm at 85 Intervals 19/08/36 Prior inferior wall MI T-wave flattening Minor ST segment elevation noted in leads 23 and F was present 9/17  Assessment and  Plan Ischemic cardio myopathy  Congestive heart failure-chronic-systolic  Ventricular tachycardia-monomorphic and polymorphic  Hypomagnesemia  Violaceous discoloration  Implantable defibrillator-[employer]  Family Loss  There has been no intercurrent ventricular  tachycardia.  Without symptoms of ischemia  Past magnesium level was low with up titration of repletion. We will check magnesium levels today.  Euvolemic continue current meds  Legs no evidence of infection or significant edema. According to his wife they are pretty normal for him.  More than 50% of 45 min was spent in counseling related to the above    Current medicines are reviewed at length with the patient today .  The patient does not  have concerns regarding medicines.

## 2015-12-07 LAB — MAGNESIUM: Magnesium: 1.8 mg/dL (ref 1.5–2.5)

## 2015-12-07 LAB — BASIC METABOLIC PANEL
BUN: 24 mg/dL (ref 7–25)
CALCIUM: 9.7 mg/dL (ref 8.6–10.3)
CO2: 24 mmol/L (ref 20–31)
Chloride: 99 mmol/L (ref 98–110)
Creat: 1.19 mg/dL — ABNORMAL HIGH (ref 0.70–1.18)
Glucose, Bld: 92 mg/dL (ref 65–99)
POTASSIUM: 5 mmol/L (ref 3.5–5.3)
SODIUM: 135 mmol/L (ref 135–146)

## 2015-12-09 ENCOUNTER — Encounter (HOSPITAL_COMMUNITY): Payer: Medicare Other

## 2015-12-11 ENCOUNTER — Encounter (HOSPITAL_COMMUNITY): Payer: Medicare Other

## 2015-12-16 ENCOUNTER — Encounter: Payer: Self-pay | Admitting: Cardiology

## 2015-12-16 ENCOUNTER — Telehealth: Payer: Self-pay

## 2015-12-16 ENCOUNTER — Encounter (HOSPITAL_COMMUNITY)
Admission: RE | Admit: 2015-12-16 | Discharge: 2015-12-16 | Disposition: A | Discharge status: Home / Self Care | Payer: Medicare Other | Source: Ambulatory Visit | Attending: Cardiology | Admitting: Cardiology

## 2015-12-16 ENCOUNTER — Ambulatory Visit (INDEPENDENT_AMBULATORY_CARE_PROVIDER_SITE_OTHER): Payer: Medicare Other | Admitting: Cardiology

## 2015-12-16 VITALS — BP 152/96 | HR 87 | Ht 73.5 in | Wt 298.2 lb

## 2015-12-16 DIAGNOSIS — I213 ST elevation (STEMI) myocardial infarction of unspecified site: Secondary | ICD-10-CM | POA: Diagnosis not present

## 2015-12-16 DIAGNOSIS — I472 Ventricular tachycardia, unspecified: Secondary | ICD-10-CM

## 2015-12-16 DIAGNOSIS — I10 Essential (primary) hypertension: Secondary | ICD-10-CM

## 2015-12-16 DIAGNOSIS — Z955 Presence of coronary angioplasty implant and graft: Secondary | ICD-10-CM

## 2015-12-16 DIAGNOSIS — I4901 Ventricular fibrillation: Secondary | ICD-10-CM

## 2015-12-16 DIAGNOSIS — I255 Ischemic cardiomyopathy: Secondary | ICD-10-CM

## 2015-12-16 DIAGNOSIS — Z95810 Presence of automatic (implantable) cardiac defibrillator: Secondary | ICD-10-CM

## 2015-12-16 DIAGNOSIS — I2510 Atherosclerotic heart disease of native coronary artery without angina pectoris: Secondary | ICD-10-CM

## 2015-12-16 DIAGNOSIS — I5042 Chronic combined systolic (congestive) and diastolic (congestive) heart failure: Secondary | ICD-10-CM

## 2015-12-16 DIAGNOSIS — I2129 ST elevation (STEMI) myocardial infarction involving other sites: Secondary | ICD-10-CM | POA: Diagnosis not present

## 2015-12-16 DIAGNOSIS — E785 Hyperlipidemia, unspecified: Secondary | ICD-10-CM

## 2015-12-16 DIAGNOSIS — I491 Atrial premature depolarization: Secondary | ICD-10-CM | POA: Diagnosis not present

## 2015-12-16 MED ORDER — IRBESARTAN 300 MG PO TABS: 300.0000 mg | ORAL_TABLET | Freq: Every day | ORAL | 3 refills | Status: DC

## 2015-12-16 MED ORDER — MAGNESIUM OXIDE 400 MG PO TABS: 400.0000 mg | ORAL_TABLET | Freq: Two times a day (BID) | ORAL | 6 refills | Status: DC

## 2015-12-16 NOTE — Patient Instructions (Signed)
Increase Irbesartan to 300 mg one tablet by mouth daily  Hold taking Amlodipine for now if blood pressure reading continue as they are at Cardiac rehab - will stop  Medications   No other changes    Your physician wants you to follow-up in: 3 months with Dr Ellyn Hack.- 30 MIN.You will receive a reminder letter in the mail two months in advance. If you don't receive a letter, please call our office to schedule the follow-up appointment.   If you need a refill on your cardiac medications before your next appointment, please call your pharmacy.

## 2015-12-16 NOTE — Progress Notes (Signed)
Leonard [name] said he is out of his prescription for his magnesium oxide. Dr Allison Quarry office called and notified.Leonard Gave RN BSN

## 2015-12-16 NOTE — Progress Notes (Signed)
PCP: [name], NP  Clinic Note: Chief Complaint  Patient presents with  . Follow-up    pt states no chest pain no SOB   . Coronary Artery Disease  . Cardiomyopathy    Status post ICD for sustained V. tach    HPI: Leonard [name] is a 69 y.o. male with a PMH below who presents today for My first posthospitalization follow-up with him after his inferior STEMI then complicated by sustained VT requiring cardioversion and then ICD placement.. -- He had a late presentation of inferior STEMI with a complaint of a burn-like symptoms. When he finally had some dyspnea associated with it and the symptoms did not resolve, he only went to his PCPs office who sent him to the emergency room because he  was found to have inferior ST elevations with some elevation changes. He had PCI to the RCA and did relatively well following that.    Leonard [name] was seen on August 3 by Ignacia Bayley, NP for post hospital follow-up after his inferior STEMI. he was in a rapid rate of 166 bpm with a right bundle branch block pattern. This was difficult to determine this was aberrantly conducted atrial flutter versus SVT versus VT.   He was admitted directly to the hospital where he was evaluated by electrophysiology and found to have sustained ventricular tachycardia. He underwent cardioversion and then subsequently ICD implant. Relook cardiac catheterization that did not show any progression of disease but patent RCA stents, PTCA site as well as the RPL branch that was occluded.   Recent Hospitalizations: July 24 of inferior STEMI, then August 3 for sustained VT.   Since discharge she has seen Dr. Caryl Comes in follow-up post ICD. ICD interrogation in September revealed revealed that he had an appropriate therapy for polymorphic VT. Beta blockers were increased. During the November visit, he was not having any symptoms of heart failure or angina. No significant edema.    Studies Reviewed: PMH and PSH  updated   Cath-PCI 08/12/2015:   Subacute presentation for inferior MI with post infarction angina on the following day. EKG still shows injury current. Therefore decided to proceed with intervention of 100% mRCA thrombotic lesion.  Mid RCA lesion, 100 %stenosed. Post intervention, there is a 0% residual stenosis.  A STENT RESOLUTE INTEG 3.0X22 drug eluting stent was successfully placed - covering the more proximal occluded segment.  A STENT RESOLUTE INTEG 3.0X12 drug-eluting stent was successfully placed overlapping proximally with the original stent due to a focal lesion upstream.   Dist RCA lesion, 90 %stenosed. Post intervention - balloon angioplasty only, there is a 10% residual stenosis.  2nd RPLB lesion, 100 %stenosed. likely distal embolization. There was dye hang-up at this stenosis. 2 small for PCI.  Ost LAD lesion, 40 %stenosed. Ramus lesion, 40 %stenosed.  There is no aortic valve stenosis.   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 Final successful stenting with 2 overlapping stents 3.0 mm x 20 mm overlap approximately with 3.0 mm x 12 mm (postdilated to 3.4 mm). The distal lesion was treated with angioplasty only, after the more proximal stent was placed this no longer appeared to be a significant lesion. There were several small distal posterolateral branches, one of which appears to increase occluded still with distal thrombus. Flow was improving at the end of the case, too small for PTCA. The 38 mm and 26 mm stents were not able to be used, as it  is not advanced around the shepherd's crook bend.   Diagnostic Diagram       Post-Intervention Diagram: Stable in 08/2015 2 overlapping Resolute DES 3.0 mm x 20 mm (distal), 3.0 mm x 12 mm (prox) -- (postdilated to 3.4 mm).             Echo 08/13/2015: Mild concentric LVH. EF 45-50%. Basal-mid inferior and inferoseptal akinesis with hypokinesis of inferolateral and apical inferior wall. 1  DD.    Echo 08/23/2015: Technically difficult study. Definity contrast administered.  Compared to a prior echo in 07/2015, the LVEF is slightly higher at 50-55% with mild inferior hypokinesis.   Cath 08/26/2015    Inferobasal and mid inferior wall akinesis. Elevated end-diastolic pressure is moderately elevated at 24 mmHg. Estimated ejection fraction 35-40%.  Widely patent right coronary stents, recently implanted late July 2017.  Also noted previously occluded RPL branch was patent - indicates cleared thromboembolic occlusion.  Ostial 50-60% LAD  Development of interventricular conduction delay/bundle branch block during the procedure.   Diagnostic Diagram         ICD Implant 08/26/2015  - 1 shock since implant  (Defib rate @ 136 bpm)   GXT 09/06/2015: Blood pressure demonstrated a normal response to exercise.  MAX HR < 120 Continue current ICD settings  Stressed the value of weight loss    Interval History:  Leonard [name] presents today overall doing very well. He is in good spirits with the exception of the fact that he is a little bit down following the loss of his daughter on Halloween. He is finding it hard to get motivated to do a lot. Despite that he is still doing very well at cardiac rehabilitation. He is becoming more more steady on his feet. Has had only minimal edema but no PND or orthopnea. No further symptoms as of his anginal equivalent symptom with MI. No heartburn or GERD type sensations. No pressure tightness in his chest with rest or exertion. He knows he had one treatment by his device in September, but really did not note any arrhythmia. He is not had any sensation of significant palpitations or lightheadedness, dizziness. No syncope/near syncope or TIA/amaurosis fugax symptoms.  No claudication.   ROS: A comprehensive was performed. Review of Systems  Constitutional: Negative for chills, fever and malaise/fatigue.  HENT: Negative for congestion and nosebleeds.    Eyes: Negative.   Respiratory: Negative for cough, shortness of breath and wheezing.   Cardiovascular:       Per history of present illness  Gastrointestinal: Negative for blood in stool, constipation, heartburn and melena.  Genitourinary: Negative for hematuria.  Musculoskeletal: Positive for joint pain. Negative for myalgias.  Neurological: Negative for dizziness and weakness.  Endo/Heme/Allergies: Negative for environmental allergies. Bruises/bleeds easily.  Psychiatric/Behavioral: Negative for memory loss and suicidal ideas. The patient is not nervous/anxious and does not have insomnia.        Feeling sad and dejected after loss of his daughter. But this is improving on a daily basis.  All other systems reviewed and are negative.   Past Medical History:  Diagnosis Date  . Anxiety   . Atherosclerotic heart disease of native coronary artery with angina pectoris ([place])    a. 07/2015 Posterior STEMI/PCI: LM nl, LAd 40ost, RI 40, RCA 145m (3.0x22 Resolute Integrity DES distal, 3.0x12 Resolute Integrity DES prox), 90d (PTCA), RPDA small, nl, RPLB2 100 - too small for PTCA. -- patent in f/u cath 08/2015  . Chronic combined systolic and diastolic CHF (  congestive heart failure) ([place])    a. 07/2015 Ehco: EF 45-50%, basal-mid inf and infsept AK, inflat, apical inf, and apical septal HK, Gr1 DD, triv MR.  [name] Hyperlipidemia with target low density lipoprotein (LDL) cholesterol less than 70 mg/dL   . Hypertensive heart disease   . Ischemic cardiomyopathy    a. 07/2015 Echo: EF 45-50%.; Follow-up Echo 08/23/2015: EF 50-55% with mild inferior hypokinesis.  . Morbid obesity ([place])   . ST elevation myocardial infarction (STEMI) of inferior wall ([place]) 08/12/2015   Subacute presentation for inferior MI with post infarction angina on the following day. EKG still shows injury current. Therefore decided to proceed with intervention of 100% mRCA thrombotic lesion.  . Sustained ventricular fibrillation ([place])  08/2015   Underwent EP evaluation with final ICD implantation.    Past Surgical History:  Procedure Laterality Date  . CARDIAC CATHETERIZATION N/A 08/12/2015   Procedure: Left Heart Cath and Coronary Angiography;  Surgeon: Leonie Man, MD;  Location: [facility];  Service: Cardiovascular: Inferior STEMI: 100% mid RCA, 90% distal RCA, 100% RPL. 40% ostial and proximal LAD and branch of ramus.  [name] CARDIAC CATHETERIZATION N/A 08/26/2015   Procedure: Left Heart Cath and Coronary Angiography;  Surgeon: Belva Crome, MD;  Location: [facility];  Service: Cardiovascular: To evaluate sustained VT. Widely patent RCA stent and distal PTCA site. Also patent RPL branch. LAD lesion appeared to be more consistent with 50-55% and 40%. Otherwise stable from previous cath. EF estimated 35-45%.  [name] CARDIOVERSION N/A 08/23/2015   Procedure: CARDIOVERSION;  Surgeon: Deboraha Sprang, MD;  Location: [facility];  Service: Cardiovascular;  Laterality: N/A;  . CORONARY ANGIOPLASTY WITH STENT PLACEMENT  08/12/2015   Mid RCA 100% reduced to 0% with 2 overlapping Resolute DES.  3.0 x 22 mm with a 3.0 x 12 mm prox overlarp (postdilated to 3.4 mm); PTCA of dRCA 90%. (Very dificult,complex case - tortuous, Shepherd's Crook RCA - unable to advance longer stents.  RPL2 noted to have thromboembolic 500% occlusion - unable to reach.  . ELECTROPHYSIOLOGIC STUDY N/A 08/26/2015   Procedure: Electrophysiology Study;  Surgeon: Deboraha Sprang, MD;  Location: [facility];  Service: Cardiovascular;  Laterality: N/A;  . EP IMPLANTABLE DEVICE N/A 08/26/2015   Procedure: ICD Implant;  Surgeon: Deboraha Sprang, MD;  Location: [facility];  Service: Cardiovascular;  Laterality: N/A;  . HEMORRHOID SURGERY    . SKIN SURGERY    . TRANSTHORACIC ECHOCARDIOGRAM  08/13/2015; 08/23/2015   a. Mild concentric LVH. EF 45-50%. Basal-mid inferior and inferoseptal akinesis with hypokinesis of inferolateral and apical inferior wall. 1 DD. ;; b.  Technically difficult study. Definity contrast administered.  Compared to a prior echo in 07/2015, the LVEF is slightly higher at 50-55% with mild inferior hypokinesis.    Current Meds  Medication Sig  . amLODipine (NORVASC) 5 MG tablet TAKE 1 TABLET BY MOUTH DAILY  . aspirin EC 81 MG tablet Take 1 tablet (81 mg total) by mouth daily.  [name] atorvastatin (LIPITOR) 80 MG tablet Take 1 tablet (80 mg total) by mouth daily at 6 PM.  . cholecalciferol (VITAMIN D) 1000 units tablet Take 2,000 Units by mouth daily.   . Coenzyme Q10 (COQ10) 200 MG CAPS Take 200 mg by mouth daily.  [name] ketotifen (ZADITOR) 0.025 % ophthalmic solution Place 1 drop into both eyes as needed (for dry eyes).  . magnesium oxide (MAG-OX) 400 MG tablet Take 1 tablet (400 mg  total) by mouth 2 (two) times daily.  . metoprolol succinate (TOPROL-XL) 50 MG 24 hr tablet Take 1 tablet (50 mg total) by mouth daily. Take with or immediately following a meal.  . Multiple Vitamin (MULTIVITAMIN) tablet Take 1 tablet by mouth daily.  . nitroGLYCERIN (NITROSTAT) 0.4 MG SL tablet Place 1 tablet (0.4 mg total) under the tongue every 5 (five) minutes as needed for chest pain.  . Omega-3 Fatty Acids (FISH OIL) 1200 MG CPDR Take 1 tablet by mouth daily.  . tadalafil (CIALIS) 20 MG tablet Take 20 mg by mouth daily as needed for erectile dysfunction.  . ticagrelor (BRILINTA) 90 MG TABS tablet Take 1 tablet (90 mg total) by mouth 2 (two) times daily.  [name] venlafaxine (EFFEXOR) 75 MG tablet TAKE 1 TABLET BY MOUTH EVERY DAY  . [DISCONTINUED] irbesartan (AVAPRO) 150 MG tablet Take 1 tablet (150 mg total) by mouth daily.   Allergies  Allergen Reactions  . Other Itching    Ragweed causes watery eyes, itching, sneezing  . Sulfa Antibiotics Other (See Comments)    Pt unsure of reaction, mother told him he was allergic      Social History   Social History  . Marital status: Married    Spouse name: N/A  . Number of children: N/A  . Years of education:  N/A   Social History Main Topics  . Smoking status: Never Smoker  . Smokeless tobacco: Never Used  . Alcohol use 1.8 - 2.4 oz/week    3 - 4 Shots of liquor per week     Comment: 3-4 glasses of scotch 3-4 days per week  . Drug use: No  . Sexual activity: Not Asked   Other Topics Concern  . None   Social History Narrative   He tells me that his daughter died on Halloween. She had a diving accident at the age of 75 and been quadriplegic for 25 years. She had been incredibly vigorous girl. He showed me her high school graduation picture.She was as he reported beautiful.  Her name was [name].   Family History  Problem Relation Age of Onset  . Diabetes Mother   . Heart disease Paternal Grandmother   . Heart disease Paternal Grandfather     Wt Readings from Last 3 Encounters:  12/16/15 135.3 kg (298 lb 3.2 oz)  12/06/15 135.5 kg (298 lb 12.8 oz)  10/14/15 134.2 kg (295 lb 12.8 oz)    PHYSICAL EXAM BP (!) 152/96   Pulse 87   Ht 6' 1.5" (1.867 m)   Wt 135.3 kg (298 lb 3.2 oz)   SpO2 97%   BMI 38.81 kg/m   -- Blood pressures at cardiac rehabilitation have been running from 902-409 systolic  General appearance: alert, cooperative, appears stated age, no distress and Moderate to severely obese. Well groomed. Pleasant mood and affect.  HEENT: [place]/AT, EOMI, MMM, anicteric sclera Neck: no adenopathy, no carotid bruit and no JVD Lungs: clear to auscultation bilaterally, normal percussion bilaterally and non-labored Heart: regular rate and rhythm, S1 and2 normal, no murmur, click, rub or gallop; unable to palpate PMI Abdomen: soft, non-tender; bowel sounds normal; no masses,  (organomegaly; no HJR. Extremities: extremities normal, atraumatic, no cyanosis, andtrivial edema Pulses: 2+ and symmetric;  Skin: Warm, dry and intact.  Neurologic: Mental status: Alert, oriented, thought content appropriate    Adult ECG Report not checked  Other studies Reviewed: Additional studies/ records  that were reviewed today include:  Recent Labs:    Chemistry  Component Value Date/Time   NA 135 12/06/2015 1324   K 5.0 12/06/2015 1324   CL 99 12/06/2015 1324   CO2 24 12/06/2015 1324   BUN 24 12/06/2015 1324   CREATININE 1.19 (H) 12/06/2015 1324      Component Value Date/Time   CALCIUM 9.7 12/06/2015 1324   ALKPHOS 75 10/14/2015 1326   AST 17 10/14/2015 1326   ALT 17 10/14/2015 1326   BILITOT 0.9 10/14/2015 1326     Lab Results  Component Value Date   CHOL 163 08/12/2015   HDL 41 08/12/2015   LDLCALC 98 08/12/2015   TRIG 122 08/12/2015   CHOLHDL 4.0 08/12/2015    ASSESSMENT / PLAN: Problem List Items Addressed This Visit    Coronary artery disease involving native coronary artery without angina pectoris - Primary (Chronic)    He had a very complex and difficult PCI to the RCA in the setting of the inferior STEMI. RCA was widely patent and follow-up calf done for his VT. In fact the distal PL branch is also patent. No further anginal type symptoms or heart failure symptoms. He is on aspirin plus Brilinta. He is on high-dose atorvastatin along with Toprol and irbesartan. Also has amlodipine for blood pressure  and antianginal effect      Relevant Medications   irbesartan (AVAPRO) 300 MG tablet   Essential hypertension (Chronic)    Not as well-controlled today as I would like. He does say that his pressures are much better in cardiac rehabilitation.   PLAN: Increase irbesartan 300 mg daily. --> Hold amlodipine until roughly 1 week of blood pressure checks in cardiac rehabilitation. If pressures continue at their current levels in cardiac rehabilitation, with to simply stop amlodipine. However if the pressures are at the current rate in the clinic visit, would restart amlodipine.      Relevant Medications   irbesartan (AVAPRO) 300 MG tablet   Hyperlipidemia LDL goal <70 (Chronic)    LDL was 98 peri-infarct. He remains on high-dose atorvastatin. We will recheck labs  when I see him back in roughly 3 months.      Relevant Medications   irbesartan (AVAPRO) 300 MG tablet   Ischemic cardiomyopathy (Chronic)    Follow-up echo suggested an improved EF although LV gram showed EF 35-40%. I would suspect that the echo is probably more accurate evaluation. No significant heart failure symptoms. He actually is not currently on any Lasix. He is on good dose of Toprol and losartan. With his blood pressure being elevated, and having some lower extremity edema it may be venous stasis related, my plan is to try to increase his irbesartan and stop the amlodipine.       Relevant Medications   irbesartan (AVAPRO) 300 MG tablet   Chronic combined systolic and diastolic CHF (congestive heart failure) (HCC) (Chronic)    No active heart failure symptoms. Not on a diuretic. ARB and beta blocker. Stable      Relevant Medications   irbesartan (AVAPRO) 300 MG tablet   Sustained ventricular fibrillation (H[facility]) (Chronic)    He has sustained VT evaluated with the EP study and finally had her bladder place. He has had one shock since it was implanted. He remains on moderate dose of Toprol. This dose can also be increased if necessary for blood pressure control once we have increases ARB.      Relevant Medications   irbesartan (AVAPRO) 300 MG tablet   Morbid obesity ([place])    His BMI has dropped  below the cutoff for morbid obesity now. He is doing cardiac rehabilitation and staying active with exercising. We will continue to encourage this exercise when the graduation rehabilitation occurs. He is also taking advantage of the [facility] dietary education.         This visit required extensive chart review as I had not seen him since his initial hospitalization with STEMI. I only briefly saw him prior to his readmission and he has had multiple evaluations, seizures etc. since then. Over 40 minutes was spent directly with patient. Greater than 50% of the time was spent in direct counseling  and reviewing results from his various studies on with discussing care plan.  Current medicines are reviewed at length with the patient today. (+/- concerns) none The following changes have been made: see below  Patient Instructions  Increase Irbesartan to 300 mg one tablet by mouth daily  Hold taking Amlodipine for now if blood pressure reading continue as they are at Cardiac rehab - will stop  Medications   No other changes    Your physician wants you to follow-up in: 3 months with Dr Ellyn Hack.- 30 MIN.You will receive a reminder letter in the mail two months in advance. If you don't receive a letter, please call our office to schedule the follow-up appointment.   If you need a refill on your cardiac medications before your next appointment, please call your pharmacy.    Studies Ordered:   No orders of the defined types were placed in this encounter.     Glenetta Hew, M.D., M.S. Interventional Cardiologist   Pager # [phone] Phone # [phone] [address]. [place], [place] [address]

## 2015-12-16 NOTE — Telephone Encounter (Signed)
Received a call from Verdis Frederickson at [facility] cardiac rehab.She stated patient needs Mag Ox refill.Refill sent to pharmacy.

## 2015-12-18 ENCOUNTER — Encounter (HOSPITAL_COMMUNITY)
Admission: RE | Admit: 2015-12-18 | Discharge: 2015-12-18 | Disposition: A | Discharge status: Home / Self Care | Payer: Medicare Other | Source: Ambulatory Visit | Attending: Cardiology | Admitting: Cardiology

## 2015-12-18 ENCOUNTER — Encounter: Payer: Self-pay | Admitting: Cardiology

## 2015-12-18 DIAGNOSIS — I2129 ST elevation (STEMI) myocardial infarction involving other sites: Secondary | ICD-10-CM | POA: Diagnosis not present

## 2015-12-18 DIAGNOSIS — Z955 Presence of coronary angioplasty implant and graft: Secondary | ICD-10-CM | POA: Diagnosis not present

## 2015-12-18 DIAGNOSIS — I491 Atrial premature depolarization: Secondary | ICD-10-CM | POA: Diagnosis not present

## 2015-12-18 DIAGNOSIS — Z95810 Presence of automatic (implantable) cardiac defibrillator: Secondary | ICD-10-CM

## 2015-12-18 DIAGNOSIS — I2111 ST elevation (STEMI) myocardial infarction involving right coronary artery: Secondary | ICD-10-CM

## 2015-12-18 DIAGNOSIS — I213 ST elevation (STEMI) myocardial infarction of unspecified site: Secondary | ICD-10-CM | POA: Diagnosis not present

## 2015-12-18 NOTE — Assessment & Plan Note (Signed)
LDL was 98 peri-infarct. He remains on high-dose atorvastatin. We will recheck labs when I see him back in roughly 3 months.

## 2015-12-18 NOTE — Assessment & Plan Note (Signed)
His BMI has dropped below the cutoff for morbid obesity now. He is doing cardiac rehabilitation and staying active with exercising. We will continue to encourage this exercise when the graduation rehabilitation occurs. He is also taking advantage of the [facility] dietary education.

## 2015-12-18 NOTE — Assessment & Plan Note (Addendum)
Follow-up echo suggested an improved EF although LV gram showed EF 35-40%. I would suspect that the echo is probably more accurate evaluation. No significant heart failure symptoms. He actually is not currently on any Lasix. He is on good dose of Toprol and losartan. With his blood pressure being elevated, and having some lower extremity edema it may be venous stasis related, my plan is to try to increase his irbesartan and stop the amlodipine.

## 2015-12-18 NOTE — Assessment & Plan Note (Signed)
No active heart failure symptoms. Not on a diuretic. ARB and beta blocker. Stable

## 2015-12-18 NOTE — Assessment & Plan Note (Signed)
He had a very complex and difficult PCI to the RCA in the setting of the inferior STEMI. RCA was widely patent and follow-up calf done for his VT. In fact the distal PL branch is also patent. No further anginal type symptoms or heart failure symptoms. He is on aspirin plus Brilinta. He is on high-dose atorvastatin along with Toprol and irbesartan. Also has amlodipine for blood pressure  and antianginal effect

## 2015-12-18 NOTE — Assessment & Plan Note (Signed)
Not as well-controlled today as I would like. He does say that his pressures are much better in cardiac rehabilitation.   PLAN: Increase irbesartan 300 mg daily. --> Hold amlodipine until roughly 1 week of blood pressure checks in cardiac rehabilitation. If pressures continue at their current levels in cardiac rehabilitation, with to simply stop amlodipine. However if the pressures are at the current rate in the clinic visit, would restart amlodipine.

## 2015-12-18 NOTE — Assessment & Plan Note (Signed)
He has sustained VT evaluated with the EP study and finally had her bladder place. He has had one shock since it was implanted. He remains on moderate dose of Toprol. This dose can also be increased if necessary for blood pressure control once we have increases ARB.

## 2015-12-20 ENCOUNTER — Encounter (HOSPITAL_COMMUNITY)
Admission: RE | Admit: 2015-12-20 | Discharge: 2015-12-20 | Disposition: A | Discharge status: Home / Self Care | Payer: Medicare Other | Source: Ambulatory Visit | Attending: Cardiology | Admitting: Cardiology

## 2015-12-20 DIAGNOSIS — I213 ST elevation (STEMI) myocardial infarction of unspecified site: Secondary | ICD-10-CM | POA: Insufficient documentation

## 2015-12-20 DIAGNOSIS — I2111 ST elevation (STEMI) myocardial infarction involving right coronary artery: Secondary | ICD-10-CM

## 2015-12-20 DIAGNOSIS — I491 Atrial premature depolarization: Secondary | ICD-10-CM | POA: Insufficient documentation

## 2015-12-20 DIAGNOSIS — I2129 ST elevation (STEMI) myocardial infarction involving other sites: Secondary | ICD-10-CM | POA: Diagnosis not present

## 2015-12-20 DIAGNOSIS — Z95810 Presence of automatic (implantable) cardiac defibrillator: Secondary | ICD-10-CM

## 2015-12-20 DIAGNOSIS — Z955 Presence of coronary angioplasty implant and graft: Secondary | ICD-10-CM

## 2015-12-23 ENCOUNTER — Encounter (HOSPITAL_COMMUNITY): Payer: Medicare Other

## 2015-12-25 ENCOUNTER — Encounter (HOSPITAL_COMMUNITY)
Admission: RE | Admit: 2015-12-25 | Discharge: 2015-12-25 | Disposition: A | Discharge status: Home / Self Care | Payer: Medicare Other | Source: Ambulatory Visit | Attending: Cardiology | Admitting: Cardiology

## 2015-12-25 DIAGNOSIS — I213 ST elevation (STEMI) myocardial infarction of unspecified site: Secondary | ICD-10-CM | POA: Diagnosis not present

## 2015-12-25 DIAGNOSIS — I491 Atrial premature depolarization: Secondary | ICD-10-CM | POA: Diagnosis not present

## 2015-12-25 DIAGNOSIS — Z955 Presence of coronary angioplasty implant and graft: Secondary | ICD-10-CM

## 2015-12-25 DIAGNOSIS — I2111 ST elevation (STEMI) myocardial infarction involving right coronary artery: Secondary | ICD-10-CM

## 2015-12-25 DIAGNOSIS — Z95810 Presence of automatic (implantable) cardiac defibrillator: Secondary | ICD-10-CM

## 2015-12-25 DIAGNOSIS — I2129 ST elevation (STEMI) myocardial infarction involving other sites: Secondary | ICD-10-CM | POA: Diagnosis not present

## 2015-12-26 NOTE — Progress Notes (Signed)
Cardiac Individual Treatment Plan  Patient Details  Name: Leonard [name] MRN: [id number] Date of Birth: 1945/03/28 Referring Provider:   Flowsheet Row CARDIAC REHAB PHASE II ORIENTATION from 10/10/2015 in [place]  Referring Provider  Glenetta Hew MD      Initial Encounter Date:  Page PHASE II ORIENTATION from 10/10/2015 in [place]  Date  10/10/15  Referring Provider  Glenetta Hew MD      Visit Diagnosis: 08/12/15 ST elevation myocardial infarction involving right coronary artery (HCC)  S/P coronary artery stent placement  S/P ICD (internal cardiac defibrillator)08/26/2015  Patient's Home Medications on Admission:  Current Outpatient Prescriptions:  .  amLODipine (NORVASC) 5 MG tablet, TAKE 1 TABLET BY MOUTH DAILY, Disp: 90 tablet, Rfl: 0 .  aspirin EC 81 MG tablet, Take 1 tablet (81 mg total) by mouth daily., Disp: , Rfl:  .  atorvastatin (LIPITOR) 80 MG tablet, Take 1 tablet (80 mg total) by mouth daily at 6 PM., Disp: 30 tablet, Rfl: 12 .  cholecalciferol (VITAMIN D) 1000 units tablet, Take 2,000 Units by mouth daily. , Disp: , Rfl:  .  Coenzyme Q10 (COQ10) 200 MG CAPS, Take 200 mg by mouth daily., Disp: , Rfl:  .  irbesartan (AVAPRO) 300 MG tablet, Take 1 tablet (300 mg total) by mouth daily., Disp: 90 tablet, Rfl: 3 .  ketotifen (ZADITOR) 0.025 % ophthalmic solution, Place 1 drop into both eyes as needed (for dry eyes)., Disp: , Rfl:  .  magnesium oxide (MAG-OX) 400 MG tablet, Take 1 tablet (400 mg total) by mouth 2 (two) times daily., Disp: 60 tablet, Rfl: 6 .  metoprolol succinate (TOPROL-XL) 50 MG 24 hr tablet, Take 1 tablet (50 mg total) by mouth daily. Take with or immediately following a meal., Disp: 30 tablet, Rfl: 6 .  Multiple Vitamin (MULTIVITAMIN) tablet, Take 1 tablet by mouth daily., Disp: , Rfl:  .  nitroGLYCERIN (NITROSTAT) 0.4 MG SL tablet, Place 1 tablet (0.4 mg  total) under the tongue every 5 (five) minutes as needed for chest pain., Disp: 25 tablet, Rfl: 2 .  Omega-3 Fatty Acids (FISH OIL) 1200 MG CPDR, Take 1 tablet by mouth daily., Disp: , Rfl:  .  tadalafil (CIALIS) 20 MG tablet, Take 20 mg by mouth daily as needed for erectile dysfunction., Disp: , Rfl:  .  ticagrelor (BRILINTA) 90 MG TABS tablet, Take 1 tablet (90 mg total) by mouth 2 (two) times daily., Disp: 60 tablet, Rfl: 12 .  venlafaxine (EFFEXOR) 75 MG tablet, TAKE 1 TABLET BY MOUTH EVERY DAY, Disp: 90 tablet, Rfl: 0  Past Medical History: Past Medical History:  Diagnosis Date  . Anxiety   . Atherosclerotic heart disease of native coronary artery with angina pectoris ([place])    a. 07/2015 Posterior STEMI/PCI: LM nl, LAd 40ost, RI 40, RCA 175m(3.0x22 Resolute Integrity DES distal, 3.0x12 Resolute Integrity DES prox), 90d (PTCA), RPDA small, nl, RPLB2 100 - too small for PTCA. -- patent in f/u cath 08/2015  . Chronic combined systolic and diastolic CHF (congestive heart failure) ([place]    a. 07/2015 Eh[facility]: EF 45-50%, basal-mid inf and infsept AK, inflat, apical inf, and apical septal HK, Gr1 DD, triv MR.  .Marland KitchenHyperlipidemia with target low density lipoprotein (LDL) cholesterol less than 70 mg/dL   . Hypertensive heart disease   . Ischemic cardiomyopathy    a. 07/2015 Echo: EF 45-50%.; Follow-up Echo 08/23/2015: EF 50-55% with mild  inferior hypokinesis.  . Morbid obesity ([place])   . ST elevation myocardial infarction (STEMI) of inferior wall ([place]) 08/12/2015   Subacute presentation for inferior MI with post infarction angina on the following day. EKG still shows injury current. Therefore decided to proceed with intervention of 100% mRCA thrombotic lesion.  . Sustained ventricular fibrillation ([place]) 08/2015   Underwent EP evaluation with final ICD implantation.    Tobacco Use: History  Smoking Status  . Never Smoker  Smokeless Tobacco  . Never Used    Labs: Recent Review Flowsheet Data     Labs for ITP Cardiac and Pulmonary Rehab Latest Ref Rng & Units 04/18/2015 08/12/2015   Cholestrol 0 - 200 mg/dL - 163   LDLCALC 0 - 99 mg/dL - 98   HDL >40 mg/dL - 41   Trlycerides <150 mg/dL - 122   Hemoglobin A1c 4.8 - 5.6 % 6.1(H) 6.1(H)   TCO2 0 - 100 mmol/L - 24      Capillary Blood Glucose: No results found for: GLUCAP   Exercise Target Goals:    Exercise Program Goal: Individual exercise prescription set with THRR, safety & activity barriers. Participant demonstrates ability to understand and report RPE using BORG scale, to self-measure pulse accurately, and to acknowledge the importance of the exercise prescription.  Exercise Prescription Goal: Starting with aerobic activity 30 plus minutes a day, 3 days per week for initial exercise prescription. Provide home exercise prescription and guidelines that participant acknowledges understanding prior to discharge.  Activity Barriers & Risk Stratification:     Activity Barriers & Cardiac Risk Stratification - 10/10/15 0906      Activity Barriers & Cardiac Risk Stratification   Activity Barriers Deconditioning;Muscular Weakness;Joint Problems;Balance Concerns  B ankle, knee and foot pain from old sport injuries   Cardiac Risk Stratification High      6 Minute Walk:     6 Minute Walk    Row Name 10/10/15 1415 10/10/15 1444       6 Minute Walk   Phase Initial Initial    Distance 1266 feet 832 feet    Walk Time 6 minutes 6 minutes    # of Rest Breaks 0 0    MPH 2.39 1.57    METS 2.76 1.4    RPE 11 13    VO2 Peak 9.66 4.9    Symptoms No Yes (comment)    Comments  - SOB, short run of V tach and 5 minute run of Sinus Tachy. Cardiologist notified    Resting HR 59 bpm 100 bpm    Resting BP 124/80 110/78    Max Ex. HR 121 bpm 122 bpm    Max Ex. BP 152/86 140/80    2 Minute Post BP 146/91  rchk 112 76 150/90       Initial Exercise Prescription:     Initial Exercise Prescription - 10/10/15 1400      Date of  Initial Exercise RX and Referring Provider   Date 10/10/15   Referring Provider Glenetta Hew MD     Recumbant Bike   Level 1   Watts --   Minutes 10   METs 1.5     NuStep   Level 1   Minutes 20   METs 1.4     Arm Ergometer   Level --   Watts --   Minutes --   METs --     Prescription Details   Frequency (times per week) 3   Duration Progress to 30 minutes  of continuous aerobic without signs/symptoms of physical distress     Intensity   THRR 40-80% of Max Heartrate 59-118   Ratings of Perceived Exertion 11-13   Perceived Dyspnea 0-4     Progression   Progression Continue to progress workloads to maintain intensity without signs/symptoms of physical distress.     Resistance Training   Training Prescription Yes   Weight 1lb   Reps 10-12      Perform Capillary Blood Glucose checks as needed.  Exercise Prescription Changes:      Exercise Prescription Changes    Row Name 10/25/15 1400 11/06/15 1700 11/25/15 1700 12/23/15 1700       Exercise Review   Progression Yes Yes Yes Yes      Response to Exercise   Blood Pressure (Admit) 124/80 138/90 130/80 134/84    Blood Pressure (Exercise) 124/80 114/76 134/68 140/90    Blood Pressure (Exit) 124/80 138/78 124/78 128/80    Heart Rate (Admit) 91 bpm 95 bpm 85 bpm 84 bpm    Heart Rate (Exercise) 108 bpm 110 bpm 114 bpm 107 bpm    Heart Rate (Exit) 101 bpm 92 bpm 78 bpm 82 bpm    Rating of Perceived Exertion (Exercise) _0 Symptoms none none none none    Duration Progress to 30 minutes of continuous aerobic without signs/symptoms of physical distress Progress to 30 minutes of continuous aerobic without signs/symptoms of physical distress Progress to 30 minutes of continuous aerobic without signs/symptoms of physical distress Progress to 30 minutes of continuous aerobic without signs/symptoms of physical distress    Intensity THRR unchanged THRR unchanged THRR unchanged THRR unchanged      Progression    Progression Continue to progress workloads to maintain intensity without signs/symptoms of physical distress. Continue to progress workloads to maintain intensity without signs/symptoms of physical distress. Continue to progress workloads to maintain intensity without signs/symptoms of physical distress. Continue to progress workloads to maintain intensity without signs/symptoms of physical distress.    Average METs 2.4 2.4 2.4 2.7      Resistance Training   Training Prescription Yes Yes Yes Yes    Weight 1lb 1lb 5lbs 5lbs    Reps 10-12 10-12 10-12 10-12      Interval Training   Interval Training No No No No      Recumbant Bike   Level 2 2 -  -    Minutes 10 10 -  -    METs - 1.9 -  -      NuStep   Level _1 Minutes _2 METs 2.4 2.4 2.2 2.8      Track   Laps _3 Minutes _4 METs 2.39 2.39 1.84 2.6      Home Exercise Plan   Plans to continue exercise at  [facility]    Frequency  - Add 3 additional days to program exercise sessions. Add 3 additional days to program exercise sessions. Add 3 additional days to program exercise sessions.       Exercise Comments:      Exercise Comments    Row Name 10/25/15 1353 11/06/15 1708 11/25/15 1706 12/23/15 1709     Exercise Comments participant is walking 10 minutes, 3 days per week at home. pt is walking 10-20 minutes, 2-3 days/week at home Pt just returnted to exercise  after two weeks after having a death in the family.  Pt is eager to get back to exercising at the same level prior to his absence Reviewed METs and Goals with pt.  Pt continues to do well with exercise.         Discharge Exercise Prescription (Final Exercise Prescription Changes):     Exercise Prescription Changes - 12/23/15 1700      Exercise Review   Progression Yes     Response to Exercise   Blood Pressure (Admit) 134/84   Blood Pressure (Exercise) 140/90   Blood Pressure (Exit) 128/80   Heart Rate (Admit) 84 bpm    Heart Rate (Exercise) 107 bpm   Heart Rate (Exit) 82 bpm   Rating of Perceived Exertion (Exercise) 12   Symptoms none   Duration Progress to 30 minutes of continuous aerobic without signs/symptoms of physical distress   Intensity THRR unchanged     Progression   Progression Continue to progress workloads to maintain intensity without signs/symptoms of physical distress.   Average METs 2.7     Resistance Training   Training Prescription Yes   Weight 5lbs   Reps 10-12     Interval Training   Interval Training No     NuStep   Level 3   Minutes 20   METs 2.8     Track   Laps 9   Minutes 1   METs 2.6     Home Exercise Plan   Plans to continue exercise at Home   Frequency Add 3 additional days to program exercise sessions.      Nutrition:  Target Goals: Understanding of nutrition guidelines, daily intake of sodium <1544m, cholesterol <2075m calories 30% from fat and 7% or less from saturated fats, daily to have 5 or more servings of fruits and vegetables.  Biometrics:     Pre Biometrics - 10/10/15 1449      Pre Biometrics   Waist Circumference 53.5 inches   Hip Circumference 58 inches   Waist to Hip Ratio 0.92 %   Triceps Skinfold 38 mm   % Body Fat 42.9 %   Grip Strength 33 kg   Flexibility 0 in   Single Leg Stand 0 seconds       Nutrition Therapy Plan and Nutrition Goals:     Nutrition Therapy & Goals - 10/11/15 1357      Nutrition Therapy   Diet Therapeutic Lifestyle Changes     Personal Nutrition Goals   Personal Goal #1 1-2 lb wt loss/week to a wt loss goal of 6-24 lb [facility] regarding individualized specific dietary modifications aiming towards targeted core components such as weight, hypertension, lipid management, diabetes, heart failure and other comorbidities.   Expected Outcomes Short Term Goal: Understand basic principles of dietary content, such as  calories, fat, sodium, cholesterol and nutrients.;Long Term Goal: Adherence to prescribed nutrition plan.      Nutrition Discharge: Nutrition Scores:     Nutrition Assessments - 11/04/15 1048      MEDFICTS Scores   Pre Score 18      Nutrition Goals Re-Evaluation:   Psychosocial: Target Goals: Acknowledge presence or absence of depression, maximize coping skills, provide positive support system. Participant is able to verbalize types and ability to use techniques and skills needed for reducing stress and depression.  Initial Review & Psychosocial Screening:     Initial Psych Review &  Screening - 10/10/15 1440      Family Dynamics   Good Support System? Yes     Barriers   Psychosocial barriers to participate in program The patient should benefit from training in stress management and relaxation.     Screening Interventions   Interventions Encouraged to exercise      Quality of Life Scores:     Quality of Life - 10/16/15 1221      Quality of Life Scores   Family Pre --  Daughter is a Environmental consultant. Receives around the clock care. Son just got out of rehab for opiod abuse.   GLOBAL Pre --  Mr Jacqualine Code denies being depressed a t this time. QOL reviewed with the patient      PHQ-9: Recent Review Flowsheet Data    Depression screen [facility] 2/9 12/26/2015 10/16/2015   Decreased Interest 0 0   Down, Depressed, Hopeless 1  0   PHQ - 2 Score 1 0      Psychosocial Evaluation and Intervention:   Psychosocial Re-Evaluation:     Psychosocial Re-Evaluation    Row Name 10/31/15 1624 11/27/15 1818 12/26/15 1441         Psychosocial Re-Evaluation   Interventions Encouraged to attend Cardiac Rehabilitation for the exercise Encouraged to attend Cardiac Rehabilitation for the exercise;Relaxation education Stress management education;Relaxation education;Encouraged to attend Cardiac Rehabilitation for the exercise     Comments  - -  Cristofer's daughter passed away last week.  Emotional support offered.  -     Continued Psychosocial Services Needed No No No        Vocational Rehabilitation: Provide vocational rehab assistance to qualifying candidates.   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      Education: Education Goals: Education classes will be provided on a weekly basis, covering required topics. Participant will state understanding/return demonstration of topics presented.  Learning Barriers/Preferences:     Learning Barriers/Preferences - 10/10/15 1443      Learning Barriers/Preferences   Learning Preferences Audio;Computer/Internet;Group Instruction;Individual Instruction;Verbal Instruction      Education Topics: Count Your Pulse:  -Group instruction provided by verbal instruction, demonstration, patient participation and written materials to support subject.  Instructors address importance of being able to find your pulse and how to count your pulse when at home without a heart monitor.  Patients get hands on experience counting their pulse with staff help and individually.   Heart Attack, Angina, and Risk Factor Modification:  -Group instruction provided by verbal instruction, video, and written materials to support subject.  Instructors address signs and symptoms of angina and heart attacks.    Also discuss risk factors for heart disease and how to make changes to improve heart health risk factors. Flowsheet Row CARDIAC REHAB PHASE II EXERCISE from 12/25/2015 in [place]  Date  11/06/15  Instruction Review Code  2- meets goals/outcomes      Functional Fitness:  -Group instruction provided by verbal instruction, demonstration, patient participation, and written materials to support  subject.  Instructors address safety measures for doing things around the house.  Discuss how to get up and down off the floor, how to pick things up properly, how to safely get out of a chair without assistance, and balance  training.   Meditation and Mindfulness:  -Group instruction provided by verbal instruction, patient participation, and written materials to support subject.  Instructor addresses importance of mindfulness and meditation practice to help reduce stress and improve awareness.  Instructor also leads participants through a meditation exercise.    Stretching for Flexibility and Mobility:  -Group instruction provided by verbal instruction, patient participation, and written materials to support subject.  Instructors lead participants through series of stretches that are designed to increase flexibility thus improving mobility.  These stretches are additional exercise for major muscle groups that are typically performed during regular warm up and cool down. Flowsheet Row CARDIAC REHAB PHASE II EXERCISE from 12/25/2015 in [place]  Date  12/25/15  Instruction Review Code  2- meets goals/outcomes      Hands Only CPR Anytime:  -Group instruction provided by verbal instruction, video, patient participation and written materials to support subject.  Instructors co-teach with AHA video for hands only CPR.  Participants get hands on experience with mannequins.   Nutrition I class: Heart Healthy Eating:  -Group instruction provided by PowerPoint slides, verbal discussion, and written materials to support subject matter. The instructor gives an explanation and review of the Therapeutic Lifestyle Changes diet recommendations, which includes a discussion on lipid goals, dietary fat, sodium, fiber, plant stanol/sterol esters, sugar, and the components of a well-balanced, healthy diet. Flowsheet Row CARDIAC REHAB PHASE II EXERCISE from 12/25/2015 in [place]  Date  10/10/15  Educator  RD  Instruction Review Code  Not applicable [Class handouts given]      Nutrition II class: Lifestyle Skills:  -Group instruction provided by PowerPoint slides, verbal discussion, and written materials to support subject matter. The instructor gives an explanation and review of label reading, grocery shopping for heart health, heart healthy recipe modifications, and ways to make healthier choices when eating out. Flowsheet Row CARDIAC REHAB PHASE II EXERCISE from 12/25/2015 in [place]  Date  10/10/15  Educator  RD  Instruction Review Code  Not applicable [class handouts given]      Diabetes Question & Answer:  -Group instruction provided by PowerPoint slides, verbal discussion, and written materials to support subject matter. The instructor gives an explanation and review of diabetes co-morbidities, pre- and post-prandial blood glucose goals, pre-exercise blood glucose goals, signs, symptoms, and treatment of hypoglycemia and hyperglycemia, and foot care basics. Flowsheet Row CARDIAC REHAB PHASE II EXERCISE from 12/25/2015 in [place]  Date  11/01/15  Educator  RD  Instruction Review Code  2- meets goals/outcomes      Diabetes Blitz:  -Group instruction provided by PowerPoint slides, verbal discussion, and written materials to support subject matter. The instructor gives an explanation and review of the physiology behind type 1 and type 2 diabetes, diabetes medications and rational behind using different medications, pre- and post-prandial blood glucose recommendations and Hemoglobin A1c goals, diabetes diet, and exercise including blood glucose guidelines for exercising safely.    Portion Distortion:  -Group instruction provided by PowerPoint slides, verbal discussion, written materials, and food models to support subject matter. The instructor gives an  explanation of serving size versus portion size, changes in portions sizes over the last 20 years, and what consists of a serving from each food group. Flowsheet Row CARDIAC REHAB PHASE II EXERCISE from 12/25/2015 in [place]  Date  12/18/15 [Holiday Eating Survival Tips]  Educator  RD  Instruction Review Code  2- meets goals/outcomes      Stress Management:  -Group instruction provided by verbal instruction, video, and written materials to support subject matter.  Instructors review role of stress in heart disease and how to cope with stress positively.   Flowsheet Row CARDIAC REHAB PHASE II EXERCISE from 12/25/2015 in [place]  Date  12/04/15  Instruction Review Code  2- meets goals/outcomes      Exercising on Your Own:  -Group instruction provided by verbal instruction, power point, and written materials to support subject.  Instructors discuss benefits of exercise, components of exercise, frequency and intensity of exercise, and end points for exercise.  Also discuss use of nitroglycerin and activating EMS.  Review options of places to exercise outside of rehab.  Review guidelines for sex with heart disease.   Cardiac Drugs I:  -Group instruction provided by verbal instruction and written materials to support subject.  Instructor reviews cardiac drug classes: antiplatelets, anticoagulants, beta blockers, and statins.  Instructor discusses reasons, side effects, and lifestyle considerations for each drug class.   Cardiac Drugs II:  -Group instruction provided by verbal instruction and written materials to support subject.  Instructor reviews cardiac drug classes: angiotensin converting enzyme inhibitors (ACE-I), angiotensin II receptor blockers (ARBs), nitrates, and calcium channel blockers.  Instructor discusses reasons, side effects, and lifestyle considerations for each drug class. Flowsheet Row CARDIAC REHAB PHASE II  EXERCISE from 12/25/2015 in [place]  Date  11/27/15  Educator  Clydia Llano [facility]  Instruction Review Code  2- meets goals/outcomes      Anatomy and Physiology of the Circulatory System:  -Group instruction provided by verbal instruction, video, and written materials to support subject.  Reviews functional anatomy of heart, how it relates to various diagnoses, and what role the heart plays in the overall system.   Knowledge Questionnaire Score:     Knowledge Questionnaire Score - 10/10/15 1415      Knowledge Questionnaire Score   Pre Score 19/24      Core Components/Risk Factors/Patient Goals at Admission:     Personal Goals and Risk Factors at Admission - 10/10/15 1450      Core Components/Risk Factors/Patient Goals on Admission   Heart Failure Yes   Intervention Provide a combined exercise and nutrition program that is supplemented with education, support and counseling about heart failure. Directed toward relieving symptoms such as shortness of breath, decreased exercise tolerance, and extremity edema.   Expected Outcomes Improve functional capacity of life;Short term: Daily weights obtained and reported for increase. Utilizing diuretic protocols set by physician.;Long term: Adoption of self-care skills and reduction of barriers for early signs and symptoms recognition and intervention leading to self-care maintenance.;Short term: Attendance in program 2-3 days a week with increased exercise capacity. Reported lower sodium intake. Reported increased fruit and vegetable intake. Reports medication compliance.      Core Components/Risk Factors/Patient Goals Review:      Goals and Risk Factor Review    Row Name 10/25/15 1354 11/06/15 1708 12/23/15 1709         Core Components/Risk Factors/Patient Goals Review   Personal Goals Review (P)  Sedentary Other Other     Review (P)  Participant is off to a good start with exercise. Increasing workloads  as tolerated. Reviewed goals and home exercise program.  Pt states he feels stronger and his breathing has improved w/ exertion Pt states he feels he's able to  walk better and he does 2-3 10 minute bouts of walking everday.       Expected Outcomes  - Continue with exercise routine and increase workload as tolerated.  Continue with exercise routine and increase workload as tolerated.         Core Components/Risk Factors/Patient Goals at Discharge (Final Review):      Goals and Risk Factor Review - 12/23/15 1709      Core Components/Risk Factors/Patient Goals Review   Personal Goals Review Other   Review Pt states he feels he's able to walk better and he does 2-3 10 minute bouts of walking everday.     Expected Outcomes Continue with exercise routine and increase workload as tolerated.       ITP Comments:     ITP Comments    Row Name 10/10/15 0901 10/25/15 1108 10/25/15 1139       ITP Comments Dr. Fransico Him, Medical Director Pt attended Taking the high out of high blood pressure 10/25/15 attended Hypertension education video/lecture class, met outcomes/goals        Comments: Bl[place]ton is making expected progress toward personal goals after completing 18 sessions. Recommend continued exercise and life style modification education including  stress management and relaxation techniques to decrease cardiac risk profile. Dionta is still gre the loss of his daughter. Emotional support provided.Will continue to monitor the patient throughout  the program.Maria Venetia Maxon, RN,BSN 12/26/2015 2:49 PM

## 2015-12-27 ENCOUNTER — Encounter (HOSPITAL_COMMUNITY): Admission: RE | Admit: 2015-12-27 | Payer: Medicare Other | Source: Ambulatory Visit

## 2015-12-30 ENCOUNTER — Encounter (HOSPITAL_COMMUNITY)
Admission: RE | Admit: 2015-12-30 | Discharge: 2015-12-30 | Disposition: A | Discharge status: Home / Self Care | Payer: Medicare Other | Source: Ambulatory Visit | Attending: Cardiology | Admitting: Cardiology

## 2015-12-30 DIAGNOSIS — Z955 Presence of coronary angioplasty implant and graft: Secondary | ICD-10-CM

## 2015-12-30 DIAGNOSIS — I491 Atrial premature depolarization: Secondary | ICD-10-CM | POA: Diagnosis not present

## 2015-12-30 DIAGNOSIS — I2111 ST elevation (STEMI) myocardial infarction involving right coronary artery: Secondary | ICD-10-CM

## 2015-12-30 DIAGNOSIS — Z95810 Presence of automatic (implantable) cardiac defibrillator: Secondary | ICD-10-CM

## 2015-12-30 DIAGNOSIS — I2129 ST elevation (STEMI) myocardial infarction involving other sites: Secondary | ICD-10-CM | POA: Diagnosis not present

## 2015-12-30 DIAGNOSIS — I213 ST elevation (STEMI) myocardial infarction of unspecified site: Secondary | ICD-10-CM | POA: Diagnosis not present

## 2016-01-01 ENCOUNTER — Encounter (HOSPITAL_COMMUNITY)
Admission: RE | Admit: 2016-01-01 | Discharge: 2016-01-01 | Disposition: A | Discharge status: Home / Self Care | Payer: Medicare Other | Source: Ambulatory Visit | Attending: Cardiology | Admitting: Cardiology

## 2016-01-01 DIAGNOSIS — I2111 ST elevation (STEMI) myocardial infarction involving right coronary artery: Secondary | ICD-10-CM

## 2016-01-01 DIAGNOSIS — Z955 Presence of coronary angioplasty implant and graft: Secondary | ICD-10-CM | POA: Diagnosis not present

## 2016-01-01 DIAGNOSIS — I491 Atrial premature depolarization: Secondary | ICD-10-CM | POA: Diagnosis not present

## 2016-01-01 DIAGNOSIS — I213 ST elevation (STEMI) myocardial infarction of unspecified site: Secondary | ICD-10-CM | POA: Diagnosis not present

## 2016-01-01 DIAGNOSIS — I2129 ST elevation (STEMI) myocardial infarction involving other sites: Secondary | ICD-10-CM | POA: Diagnosis not present

## 2016-01-03 ENCOUNTER — Encounter (HOSPITAL_COMMUNITY): Payer: Medicare Other

## 2016-01-06 ENCOUNTER — Encounter (HOSPITAL_COMMUNITY)
Admission: RE | Admit: 2016-01-06 | Discharge: 2016-01-06 | Disposition: A | Discharge status: Home / Self Care | Payer: Medicare Other | Source: Ambulatory Visit | Attending: Cardiology | Admitting: Cardiology

## 2016-01-06 DIAGNOSIS — Z95810 Presence of automatic (implantable) cardiac defibrillator: Secondary | ICD-10-CM

## 2016-01-06 DIAGNOSIS — I213 ST elevation (STEMI) myocardial infarction of unspecified site: Secondary | ICD-10-CM | POA: Diagnosis not present

## 2016-01-06 DIAGNOSIS — I2111 ST elevation (STEMI) myocardial infarction involving right coronary artery: Secondary | ICD-10-CM

## 2016-01-06 DIAGNOSIS — Z955 Presence of coronary angioplasty implant and graft: Secondary | ICD-10-CM

## 2016-01-06 DIAGNOSIS — I491 Atrial premature depolarization: Secondary | ICD-10-CM | POA: Diagnosis not present

## 2016-01-06 DIAGNOSIS — I2129 ST elevation (STEMI) myocardial infarction involving other sites: Secondary | ICD-10-CM | POA: Diagnosis not present

## 2016-01-08 ENCOUNTER — Encounter (HOSPITAL_COMMUNITY): Payer: Medicare Other

## 2016-01-10 ENCOUNTER — Encounter (HOSPITAL_COMMUNITY): Admission: RE | Admit: 2016-01-10 | Payer: Medicare Other | Source: Ambulatory Visit

## 2016-01-15 ENCOUNTER — Encounter (HOSPITAL_COMMUNITY): Payer: Medicare Other

## 2016-01-17 ENCOUNTER — Encounter (HOSPITAL_COMMUNITY): Payer: Medicare Other

## 2016-01-21 NOTE — Progress Notes (Signed)
Cardiac Individual Treatment Plan  Patient Details  Name: Leonard [name] MRN: [id number] Date of Birth: [birth year] Referring Provider:   Flowsheet Row CARDIAC REHAB PHASE II ORIENTATION from 10/10/2015 in [place]  Referring Provider  Glenetta Hew MD      Initial Encounter Date:  [place] PHASE II ORIENTATION from 10/10/2015 in [place]  Date  10/10/15  Referring Provider  Glenetta Hew MD      Visit Diagnosis: S/P STEMI, S/P DES 7/17  Patient's Home Medications on Admission:  Current Outpatient Prescriptions:  .  amLODipine (NORVASC) 5 MG tablet, TAKE 1 TABLET BY MOUTH DAILY, Disp: 90 tablet, Rfl: 0 .  aspirin EC 81 MG tablet, Take 1 tablet (81 mg total) by mouth daily., Disp: , Rfl:  .  atorvastatin (LIPITOR) 80 MG tablet, Take 1 tablet (80 mg total) by mouth daily at 6 PM., Disp: 30 tablet, Rfl: 12 .  cholecalciferol (VITAMIN D) 1000 units tablet, Take 2,000 Units by mouth daily. , Disp: , Rfl:  .  Coenzyme Q10 (COQ10) 200 MG CAPS, Take 200 mg by mouth daily., Disp: , Rfl:  .  irbesartan (AVAPRO) 300 MG tablet, Take 1 tablet (300 mg total) by mouth daily., Disp: 90 tablet, Rfl: 3 .  ketotifen (ZADITOR) 0.025 % ophthalmic solution, Place 1 drop into both eyes as needed (for dry eyes)., Disp: , Rfl:  .  magnesium oxide (MAG-OX) 400 MG tablet, Take 1 tablet (400 mg total) by mouth 2 (two) times daily., Disp: 60 tablet, Rfl: 6 .  metoprolol succinate (TOPROL-XL) 50 MG 24 hr tablet, Take 1 tablet (50 mg total) by mouth daily. Take with or immediately following a meal., Disp: 30 tablet, Rfl: 6 .  Multiple Vitamin (MULTIVITAMIN) tablet, Take 1 tablet by mouth daily., Disp: , Rfl:  .  nitroGLYCERIN (NITROSTAT) 0.4 MG SL tablet, Place 1 tablet (0.4 mg total) under the tongue every 5 (five) minutes as needed for chest pain., Disp: 25 tablet, Rfl: 2 .  Omega-3 Fatty Acids (FISH OIL) 1200 MG CPDR, Take  1 tablet by mouth daily., Disp: , Rfl:  .  tadalafil (CIALIS) 20 MG tablet, Take 20 mg by mouth daily as needed for erectile dysfunction., Disp: , Rfl:  .  ticagrelor (BRILINTA) 90 MG TABS tablet, Take 1 tablet (90 mg total) by mouth 2 (two) times daily., Disp: 60 tablet, Rfl: 12 .  venlafaxine (EFFEXOR) 75 MG tablet, TAKE 1 TABLET BY MOUTH EVERY DAY, Disp: 90 tablet, Rfl: 0  Past Medical History: Past Medical History:  Diagnosis Date  . Anxiety   . Atherosclerotic heart disease of native coronary artery with angina pectoris ([place])    a. 07/2015 Posterior STEMI/PCI: LM nl, LAd 40ost, RI 40, RCA 157m(3.0x22 Resolute Integrity DES distal, 3.0x12 Resolute Integrity DES prox), 90d (PTCA), RPDA small, nl, RPLB2 100 - too small for PTCA. -- patent in f/u cath 08/2015  . Chronic combined systolic and diastolic CHF (congestive heart failure) ([place]    a. 07/2015 Ehco: EF 45-50%, basal-mid inf and infsept AK, inflat, apical inf, and apical septal HK, Gr1 DD, triv MR.  .Marland KitchenHyperlipidemia with target low density lipoprotein (LDL) cholesterol less than 70 mg/dL   . Hypertensive heart disease   . Ischemic cardiomyopathy    a. 07/2015 Echo: EF 45-50%.; Follow-up Echo 08/23/2015: EF 50-55% with mild inferior hypokinesis.  . Morbid obesity ([place]   . ST elevation myocardial infarction (STEMI) of inferior  wall ([place]) 08/12/2015   Subacute presentation for inferior MI with post infarction angina on the following day. EKG still shows injury current. Therefore decided to proceed with intervention of 100% mRCA thrombotic lesion.  . Sustained ventricular fibrillation ([place]) 08/2015   Underwent EP evaluation with final ICD implantation.    Tobacco Use: History  Smoking Status  . Never Smoker  Smokeless Tobacco  . Never Used    Labs: Recent Review Flowsheet Data    Labs for ITP Cardiac and Pulmonary Rehab Latest Ref Rng & Units 04/18/2015 08/12/2015   Cholestrol 0 - 200 mg/dL - 163   LDLCALC 0 - 99 mg/dL - 98   HDL  >40 mg/dL - 41   Trlycerides <150 mg/dL - 122   Hemoglobin A1c 4.8 - 5.6 % 6.1(H) 6.1(H)   TCO2 0 - 100 mmol/L - 24      Capillary Blood Glucose: No results found for: GLUCAP   Exercise Target Goals:    Exercise Program Goal: Individual exercise prescription set with THRR, safety & activity barriers. Participant demonstrates ability to understand and report RPE using BORG scale, to self-measure pulse accurately, and to acknowledge the importance of the exercise prescription.  Exercise Prescription Goal: Starting with aerobic activity 30 plus minutes a day, 3 days per week for initial exercise prescription. Provide home exercise prescription and guidelines that participant acknowledges understanding prior to discharge.  Activity Barriers & Risk Stratification:     Activity Barriers & Cardiac Risk Stratification - 10/10/15 0906      Activity Barriers & Cardiac Risk Stratification   Activity Barriers Deconditioning;Muscular Weakness;Joint Problems;Balance Concerns  B ankle, knee and foot pain from old sport injuries   Cardiac Risk Stratification High      6 Minute Walk:     6 Minute Walk    Row Name 10/10/15 1415 10/10/15 1444       6 Minute Walk   Phase Initial Initial    Distance 1266 feet 832 feet    Walk Time 6 minutes 6 minutes    # of Rest Breaks 0 0    MPH 2.39 1.57    METS 2.76 1.4    RPE 11 13    VO2 Peak 9.66 4.9    Symptoms No Yes (comment)    Comments  - SOB, short run of V tach and 5 minute run of Sinus Tachy. Cardiologist notified    Resting HR 59 bpm 100 bpm    Resting BP 124/80 110/78    Max Ex. HR 121 bpm 122 bpm    Max Ex. BP 152/86 140/80    2 Minute Post BP 146/91  rchk 112 76 150/90       Initial Exercise Prescription:     Initial Exercise Prescription - 10/10/15 1400      Date of Initial Exercise RX and Referring Provider   Date 10/10/15   Referring Provider Glenetta Hew MD     Recumbant Bike   Level 1   Watts --   Minutes 10    METs 1.5     NuStep   Level 1   Minutes 20   METs 1.4     Arm Ergometer   Level --   Watts --   Minutes --   METs --     Prescription Details   Frequency (times per week) 3   Duration Progress to 30 minutes of continuous aerobic without signs/symptoms of physical distress     Intensity   THRR 40-80%  of Max Heartrate [phone]   Ratings of Perceived Exertion 11-13   Perceived Dyspnea 0-4     Progression   Progression Continue to progress workloads to maintain intensity without signs/symptoms of physical distress.     Resistance Training   Training Prescription Yes   Weight 1lb   Reps 10-12      Perform Capillary Blood Glucose checks as needed.  Exercise Prescription Changes:      Exercise Prescription Changes    Row Name 10/25/15 1400 11/06/15 1700 11/25/15 1700 12/23/15 1700       Exercise Review   Progression Yes Yes Yes Yes      Response to Exercise   Blood Pressure (Admit) 124/80 138/90 130/80 134/84    Blood Pressure (Exercise) 124/80 114/76 134/68 140/90    Blood Pressure (Exit) 124/80 138/78 124/78 128/80    Heart Rate (Admit) 91 bpm 95 bpm 85 bpm 84 bpm    Heart Rate (Exercise) 108 bpm 110 bpm 114 bpm 107 bpm    Heart Rate (Exit) 101 bpm 92 bpm 78 bpm 82 bpm    Rating of Perceived Exertion (Exercise) _0 Symptoms none none none none    Duration Progress to 30 minutes of continuous aerobic without signs/symptoms of physical distress Progress to 30 minutes of continuous aerobic without signs/symptoms of physical distress Progress to 30 minutes of continuous aerobic without signs/symptoms of physical distress Progress to 30 minutes of continuous aerobic without signs/symptoms of physical distress    Intensity THRR unchanged THRR unchanged THRR unchanged THRR unchanged      Progression   Progression Continue to progress workloads to maintain intensity without signs/symptoms of physical distress. Continue to progress workloads to maintain  intensity without signs/symptoms of physical distress. Continue to progress workloads to maintain intensity without signs/symptoms of physical distress. Continue to progress workloads to maintain intensity without signs/symptoms of physical distress.    Average METs 2.4 2.4 2.4 2.7      Resistance Training   Training Prescription Yes Yes Yes Yes    Weight 1lb 1lb 5lbs 5lbs    Reps 10-12 10-12 10-12 10-12      Interval Training   Interval Training No No No No      Recumbant Bike   Level 2 2 -  -    Minutes 10 10 -  -    METs - 1.9 -  -      NuStep   Level _1 Minutes _2 METs 2.4 2.4 2.2 2.8      Track   Laps _3 Minutes _4 METs 2.39 2.39 1.84 2.6      Home Exercise Plan   Plans to continue exercise at  [facility]    Frequency  - Add 3 additional days to program exercise sessions. Add 3 additional days to program exercise sessions. Add 3 additional days to program exercise sessions.       Exercise Comments:      Exercise Comments    Row Name 10/25/15 1353 11/06/15 1708 11/25/15 1706 12/23/15 1709 01/22/16 1213   Exercise Comments participant is walking 10 minutes, 3 days per week at home. pt is walking 10-20 minutes, 2-3 days/week at home Pt just returnted to exercise after two weeks after having a death in the family.  Pt is eager to get back  to exercising at the same level prior to his absence Reviewed METs and Goals with pt.  Pt continues to do well with exercise.   Have not reviewed METs and goals wiht pt.  Pt has been absent from exercise for a couple of weeks on vacation       Discharge Exercise Prescription (Final Exercise Prescription Changes):     Exercise Prescription Changes - 12/23/15 1700      Exercise Review   Progression Yes     Response to Exercise   Blood Pressure (Admit) 134/84   Blood Pressure (Exercise) 140/90   Blood Pressure (Exit) 128/80   Heart Rate (Admit) 84 bpm   Heart Rate (Exercise) 107 bpm    Heart Rate (Exit) 82 bpm   Rating of Perceived Exertion (Exercise) 12   Symptoms none   Duration Progress to 30 minutes of continuous aerobic without signs/symptoms of physical distress   Intensity THRR unchanged     Progression   Progression Continue to progress workloads to maintain intensity without signs/symptoms of physical distress.   Average METs 2.7     Resistance Training   Training Prescription Yes   Weight 5lbs   Reps 10-12     Interval Training   Interval Training No     NuStep   Level 3   Minutes 20   METs 2.8     Track   Laps 9   Minutes 1   METs 2.6     Home Exercise Plan   Plans to continue exercise at Home   Frequency Add 3 additional days to program exercise sessions.      Nutrition:  Target Goals: Understanding of nutrition guidelines, daily intake of sodium <1552m, cholesterol <2090m calories 30% from fat and 7% or less from saturated fats, daily to have 5 or more servings of fruits and vegetables.  Biometrics:     Pre Biometrics - 10/10/15 1449      Pre Biometrics   Waist Circumference 53.5 inches   Hip Circumference 58 inches   Waist to Hip Ratio 0.92 %   Triceps Skinfold 38 mm   % Body Fat 42.9 %   Grip Strength 33 kg   Flexibility 0 in   Single Leg Stand 0 seconds       Nutrition Therapy Plan and Nutrition Goals:     Nutrition Therapy & Goals - 10/11/15 1357      Nutrition Therapy   Diet Therapeutic Lifestyle Changes     Personal Nutrition Goals   Personal Goal #1 1-2 lb wt loss/week to a wt loss goal of 6-24 lb [facility] regarding individualized specific dietary modifications aiming towards targeted core components such as weight, hypertension, lipid management, diabetes, heart failure and other comorbidities.   Expected Outcomes Short Term Goal: Understand basic principles of dietary content, such as calories, fat, sodium,  cholesterol and nutrients.;Long Term Goal: Adherence to prescribed nutrition plan.      Nutrition Discharge: Nutrition Scores:     Nutrition Assessments - 11/04/15 1048      MEDFICTS Scores   Pre Score 18      Nutrition Goals Re-Evaluation:   Psychosocial: Target Goals: Acknowledge presence or absence of depression, maximize coping skills, provide positive support system. Participant is able to verbalize types and ability to use techniques and skills needed for reducing stress and depression.  Initial Review & Psychosocial Screening:  Initial Psych Review & Screening - 10/10/15 1440      Family Dynamics   Good Support System? Yes     Barriers   Psychosocial barriers to participate in program The patient should benefit from training in stress management and relaxation.     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 Son just got out of rehab for opiod abuse.   GLOBAL Pre --  Mr Leonard [name] denies being depressed a t this time. QOL reviewed with the patient      PHQ-9: Recent Review Flowsheet Data    Depression screen [facility] 2/9 12/26/2015 10/16/2015   Decreased Interest 0 0   Down, Depressed, Hopeless 1  0   PHQ - 2 Score 1 0      Psychosocial Evaluation and Intervention:   Psychosocial Re-Evaluation:     Psychosocial Re-Evaluation    Row Name 10/31/15 1624 11/27/15 1818 12/26/15 1441 01/21/16 1621       Psychosocial Re-Evaluation   Interventions Encouraged to attend Cardiac Rehabilitation for the exercise Encouraged to attend Cardiac Rehabilitation for the exercise;Relaxation education Stress management education;Relaxation education;Encouraged to attend Cardiac Rehabilitation for the exercise Stress management education;Encouraged to attend Cardiac Rehabilitation  for the exercise;Relaxation education    Comments  - -  Leonard [name] passed away last week. Emotional support offered.  - Leonard [name] continues to deal with the loss of his [name]. Continued emotional support provided.     Continued Psychosocial Services Needed No No No No  Leonard [name] declined the needed for counselling at this time.       Vocational Rehabilitation: Provide vocational rehab assistance to qualifying candidates.   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      Education: Education Goals: Education classes will be provided on a weekly basis, covering required topics. Participant will state understanding/return demonstration of topics presented.  Learning Barriers/Preferences:     Learning Barriers/Preferences - 10/10/15 1443      Learning Barriers/Preferences   Learning Preferences Audio;Computer/Internet;Group Instruction;Individual Instruction;Verbal Instruction      Education Topics: Count Your Pulse:  -Group instruction provided by verbal instruction, demonstration, patient participation and written materials to support subject.  Instructors address importance of being able to find your pulse and how to count your pulse when at home without a heart monitor.  Patients get hands on experience counting their pulse with staff help and individually.   Heart Attack, Angina, and Risk Factor Modification:  -Group instruction provided by verbal instruction, video, and written materials to support subject.  Instructors address signs and symptoms of angina and heart attacks.    Also discuss risk factors for heart disease and how to make changes to improve heart health risk factors. Flowsheet Row CARDIAC REHAB PHASE II EXERCISE from 01/01/2016 in [place]  Date  11/06/15  Instruction Review [name]  2- meets goals/outcomes      Functional Fitness:  -Group instruction provided by verbal instruction, demonstration, patient participation, and written materials to support subject.  Instructors address safety measures for doing things around the house.  Discuss how to get up and down off the floor, how to pick things up properly, how to safely get out of a chair without assistance, and balance training.   Meditation and Mindfulness:  -Group instruction provided by verbal instruction, patient participation, and written materials to support subject.  Instructor addresses importance of mindfulness and meditation practice to help reduce stress and improve awareness.  Instructor also leads participants through a meditation exercise.    Stretching for Flexibility and Mobility:  -Group instruction provided by verbal instruction, patient participation, and written materials to support subject.  Instructors lead participants through series of stretches that are designed to increase flexibility thus improving mobility.  These stretches are additional exercise for major muscle groups that are typically performed during regular warm up and cool down. Flowsheet Row CARDIAC REHAB PHASE II EXERCISE from 01/01/2016 in [place]  Date  12/25/15  Instruction Review [name]  2- meets goals/outcomes      Hands Only CPR Anytime:  -Group instruction provided by verbal instruction, video, patient participation and written materials to support subject.  Instructors co-teach with AHA video for hands only CPR.  Participants get hands on experience with mannequins.   Nutrition I class: Heart Healthy Eating:  -Group instruction provided by PowerPoint slides, verbal discussion, and written materials to support subject matter. The instructor gives an explanation and review of the Therapeutic Lifestyle Changes diet  recommendations, which includes a discussion on lipid goals, dietary fat, sodium, fiber, plant stanol/sterol esters, sugar, and the components of a well-balanced, healthy diet. Flowsheet Row CARDIAC REHAB PHASE II EXERCISE from 01/01/2016 in [place]  Date  10/10/15  Educator  RD  Instruction Review [name]  Not applicable [Class handouts given]      Nutrition II class: Lifestyle Skills:  -Group instruction provided by PowerPoint slides, verbal discussion, and written materials to support subject matter. The instructor gives an explanation and review of label reading, grocery shopping for heart health, heart healthy recipe modifications, and ways to make healthier choices when eating out. Flowsheet Row CARDIAC REHAB PHASE II EXERCISE from 01/01/2016 in [place]  Date  10/10/15  Educator  RD  Instruction Review [name]  Not applicable [class handouts given]      Diabetes Question & Answer:  -Group instruction provided by PowerPoint slides, verbal discussion, and written materials to support subject matter. The instructor gives an explanation and review of diabetes co-morbidities, pre- and post-prandial blood glucose goals, pre-exercise blood glucose goals, signs, symptoms, and treatment of hypoglycemia and hyperglycemia, and foot care basics. Flowsheet Row CARDIAC REHAB PHASE II EXERCISE from 01/01/2016 in [place]  Date  11/01/15  Educator  RD  Instruction Review [name]  2- meets goals/outcomes      Diabetes Blitz:  -Group instruction provided by PowerPoint slides, verbal discussion, and written materials to support subject matter. The instructor gives an explanation and review of the physiology behind type 1 and type 2 diabetes, diabetes medications and rational behind using different medications, pre- and post-prandial blood glucose recommendations and Hemoglobin A1c goals, diabetes diet, and  exercise including blood glucose guidelines for exercising safely.    Portion Distortion:  -Group instruction provided by PowerPoint slides, verbal discussion, written materials, and food models to support subject matter. The instructor gives an explanation of serving size versus portion size, changes in portions sizes over the  last 20 years, and what consists of a serving from each food group. Flowsheet Row CARDIAC REHAB PHASE II EXERCISE from 01/01/2016 in [place]  Date  12/18/15 [Holiday Eating Survival Tips]  Educator  RD  Instruction Review [name]  2- meets goals/outcomes      Stress Management:  -Group instruction provided by verbal instruction, video, and written materials to support subject matter.  Instructors review role of stress in heart disease and how to cope with stress positively.   Flowsheet Row CARDIAC REHAB PHASE II EXERCISE from 01/01/2016 in [place]  Date  12/04/15  Instruction Review [name]  2- meets goals/outcomes      Exercising on Your Own:  -Group instruction provided by verbal instruction, power point, and written materials to support subject.  Instructors discuss benefits of exercise, components of exercise, frequency and intensity of exercise, and end points for exercise.  Also discuss use of nitroglycerin and activating EMS.  Review options of places to exercise outside of rehab.  Review guidelines for sex with heart disease.   Cardiac Drugs I:  -Group instruction provided by verbal instruction and written materials to support subject.  Instructor reviews cardiac drug classes: antiplatelets, anticoagulants, beta blockers, and statins.  Instructor discusses reasons, side effects, and lifestyle considerations for each drug class. Flowsheet Row CARDIAC REHAB PHASE II EXERCISE from 01/01/2016 in [place]  Date  01/01/16  Educator  pharmacist  Instruction Review  [name]  2- meets goals/outcomes      Cardiac Drugs II:  -Group instruction provided by verbal instruction and written materials to support subject.  Instructor reviews cardiac drug classes: angiotensin converting enzyme inhibitors (ACE-I), angiotensin II receptor blockers (ARBs), nitrates, and calcium channel blockers.  Instructor discusses reasons, side effects, and lifestyle considerations for each drug class. Flowsheet Row CARDIAC REHAB PHASE II EXERCISE from 01/01/2016 in [place]  Date  11/27/15  Educator  Clydia Llano [facility]  Instruction Review [name]  2- meets goals/outcomes      Anatomy and Physiology of the Circulatory System:  -Group instruction provided by verbal instruction, video, and written materials to support subject.  Reviews functional anatomy of heart, how it relates to various diagnoses, and what role the heart plays in the overall system.   Knowledge Questionnaire Score:     Knowledge Questionnaire Score - 10/10/15 1415      Knowledge Questionnaire Score   Pre Score 19/24      Core Components/Risk Factors/Patient Goals at Admission:     Personal Goals and Risk Factors at Admission - 10/10/15 1450      Core Components/Risk Factors/Patient Goals on Admission   Heart Failure Yes   Intervention Provide a combined exercise and nutrition program that is supplemented with education, support and counseling about heart failure. Directed toward relieving symptoms such as shortness of breath, decreased exercise tolerance, and extremity edema.   Expected Outcomes Improve functional capacity of life;Short term: Daily weights obtained and reported for increase. Utilizing diuretic protocols set by physician.;Long term: Adoption of self-care skills and reduction of barriers for early signs and symptoms recognition and intervention leading to self-care maintenance.;Short term: Attendance in program 2-3 days a week with increased exercise capacity.  Reported lower sodium intake. Reported increased fruit and vegetable intake. Reports medication compliance.      Core Components/Risk Factors/Patient Goals Review:      Goals and Risk Factor Review    Row Name  10/25/15 1354 11/06/15 1708 12/23/15 1709         Core Components/Risk Factors/Patient Goals Review   Personal Goals Review (P)  Sedentary Other Other     Review (P)  Participant is off to a good start with exercise. Increasing workloads as tolerated. Reviewed goals and home exercise program.  Pt states he feels stronger and his breathing has improved w/ exertion Pt states he feels he's able to walk better and he does 2-3 10 minute bouts of walking everday.       Expected Outcomes  - Continue with exercise routine and increase workload as tolerated.  Continue with exercise routine and increase workload as tolerated.         Core Components/Risk Factors/Patient Goals at Discharge (Final Review):      Goals and Risk Factor Review - 12/23/15 1709      Core Components/Risk Factors/Patient Goals Review   Personal Goals Review Other   Review Pt states he feels he's able to walk better and he does 2-3 10 minute bouts of walking everday.     Expected Outcomes Continue with exercise routine and increase workload as tolerated.       ITP Comments:     ITP Comments    Row Name 10/10/15 0901 10/25/15 1108 10/25/15 1139       ITP Comments Dr. Fransico Him, Medical Director Pt attended Taking the high out of high blood pressure 10/25/15 attended Hypertension education video/lecture class, met outcomes/goals        Comments: Leonard [name]  is making expected progress toward personal goals after completing 21 sessions. Recommend continued exercise and life style modification education including  stress management and relaxation techniques to decrease cardiac risk profile. Leonard [name] continues to [employer] the loss of his [name] Leonard [name]. Leonard [name] has been absent for over a week due to being away for vacation  with his wife. Will continue to offer emotional support.Barnet Pall, RN,BSN 01/23/2016 11:01 AM

## 2016-01-22 ENCOUNTER — Encounter (HOSPITAL_COMMUNITY)
Admission: RE | Admit: 2016-01-22 | Discharge: 2016-01-22 | Disposition: A | Discharge status: Home / Self Care | Payer: Medicare Other | Source: Ambulatory Visit | Attending: Cardiology | Admitting: Cardiology

## 2016-01-22 DIAGNOSIS — I213 ST elevation (STEMI) myocardial infarction of unspecified site: Secondary | ICD-10-CM | POA: Insufficient documentation

## 2016-01-22 DIAGNOSIS — Z955 Presence of coronary angioplasty implant and graft: Secondary | ICD-10-CM | POA: Insufficient documentation

## 2016-01-22 DIAGNOSIS — I491 Atrial premature depolarization: Secondary | ICD-10-CM | POA: Insufficient documentation

## 2016-01-22 DIAGNOSIS — I2129 ST elevation (STEMI) myocardial infarction involving other sites: Secondary | ICD-10-CM | POA: Insufficient documentation

## 2016-01-23 DIAGNOSIS — Z85828 Personal history of other malignant neoplasm of skin: Secondary | ICD-10-CM | POA: Diagnosis not present

## 2016-01-23 DIAGNOSIS — L2089 Other atopic dermatitis: Secondary | ICD-10-CM | POA: Diagnosis not present

## 2016-01-23 DIAGNOSIS — L111 Transient acantholytic dermatosis [Grover]: Secondary | ICD-10-CM | POA: Diagnosis not present

## 2016-01-24 ENCOUNTER — Encounter (HOSPITAL_COMMUNITY)
Admission: RE | Admit: 2016-01-24 | Discharge: 2016-01-24 | Disposition: A | Discharge status: Home / Self Care | Payer: Medicare Other | Source: Ambulatory Visit

## 2016-01-27 ENCOUNTER — Encounter (HOSPITAL_COMMUNITY)
Admission: RE | Admit: 2016-01-27 | Discharge: 2016-01-27 | Disposition: A | Discharge status: Home / Self Care | Payer: Medicare Other | Source: Ambulatory Visit | Attending: Cardiology | Admitting: Cardiology

## 2016-01-27 DIAGNOSIS — I213 ST elevation (STEMI) myocardial infarction of unspecified site: Secondary | ICD-10-CM | POA: Diagnosis not present

## 2016-01-27 DIAGNOSIS — Z955 Presence of coronary angioplasty implant and graft: Secondary | ICD-10-CM | POA: Diagnosis not present

## 2016-01-27 DIAGNOSIS — I491 Atrial premature depolarization: Secondary | ICD-10-CM | POA: Diagnosis not present

## 2016-01-27 DIAGNOSIS — I2111 ST elevation (STEMI) myocardial infarction involving right coronary artery: Secondary | ICD-10-CM

## 2016-01-27 DIAGNOSIS — I2129 ST elevation (STEMI) myocardial infarction involving other sites: Secondary | ICD-10-CM | POA: Diagnosis not present

## 2016-01-27 DIAGNOSIS — Z95810 Presence of automatic (implantable) cardiac defibrillator: Secondary | ICD-10-CM

## 2016-01-29 ENCOUNTER — Encounter (HOSPITAL_COMMUNITY)
Admission: RE | Admit: 2016-01-29 | Discharge: 2016-01-29 | Disposition: A | Discharge status: Home / Self Care | Payer: Medicare Other | Source: Ambulatory Visit | Attending: Cardiology | Admitting: Cardiology

## 2016-01-29 VITALS — Ht 70.0 in | Wt 301.8 lb

## 2016-01-29 DIAGNOSIS — Z955 Presence of coronary angioplasty implant and graft: Secondary | ICD-10-CM | POA: Diagnosis not present

## 2016-01-29 DIAGNOSIS — I213 ST elevation (STEMI) myocardial infarction of unspecified site: Secondary | ICD-10-CM | POA: Diagnosis not present

## 2016-01-29 DIAGNOSIS — I491 Atrial premature depolarization: Secondary | ICD-10-CM | POA: Diagnosis not present

## 2016-01-29 DIAGNOSIS — I2129 ST elevation (STEMI) myocardial infarction involving other sites: Secondary | ICD-10-CM | POA: Diagnosis not present

## 2016-01-29 DIAGNOSIS — I2111 ST elevation (STEMI) myocardial infarction involving right coronary artery: Secondary | ICD-10-CM

## 2016-01-31 ENCOUNTER — Encounter (HOSPITAL_COMMUNITY): Admission: RE | Admit: 2016-01-31 | Payer: Medicare Other | Source: Ambulatory Visit

## 2016-02-02 ENCOUNTER — Other Ambulatory Visit: Payer: Self-pay | Admitting: Family Medicine

## 2016-02-03 ENCOUNTER — Encounter (HOSPITAL_COMMUNITY)
Admission: RE | Admit: 2016-02-03 | Discharge: 2016-02-03 | Disposition: A | Discharge status: Home / Self Care | Payer: Medicare Other | Source: Ambulatory Visit | Attending: Cardiology | Admitting: Cardiology

## 2016-02-03 DIAGNOSIS — I2111 ST elevation (STEMI) myocardial infarction involving right coronary artery: Secondary | ICD-10-CM

## 2016-02-03 DIAGNOSIS — Z95810 Presence of automatic (implantable) cardiac defibrillator: Secondary | ICD-10-CM

## 2016-02-03 DIAGNOSIS — I491 Atrial premature depolarization: Secondary | ICD-10-CM | POA: Diagnosis not present

## 2016-02-03 DIAGNOSIS — Z955 Presence of coronary angioplasty implant and graft: Secondary | ICD-10-CM | POA: Diagnosis not present

## 2016-02-03 DIAGNOSIS — I213 ST elevation (STEMI) myocardial infarction of unspecified site: Secondary | ICD-10-CM | POA: Diagnosis not present

## 2016-02-03 DIAGNOSIS — I2129 ST elevation (STEMI) myocardial infarction involving other sites: Secondary | ICD-10-CM | POA: Diagnosis not present

## 2016-02-03 NOTE — Telephone Encounter (Signed)
Is this okay to refill? 

## 2016-02-03 NOTE — Telephone Encounter (Signed)
He needs a med check appt. Ok to give him 30 days.

## 2016-02-04 ENCOUNTER — Other Ambulatory Visit: Payer: Self-pay | Admitting: Family Medicine

## 2016-02-04 NOTE — Telephone Encounter (Signed)
Left Detailed message that he needs to schedule appt

## 2016-02-05 ENCOUNTER — Encounter (HOSPITAL_COMMUNITY): Payer: Medicare Other

## 2016-02-07 ENCOUNTER — Encounter (HOSPITAL_COMMUNITY): Payer: Medicare Other

## 2016-02-10 ENCOUNTER — Encounter (HOSPITAL_COMMUNITY)
Admission: RE | Admit: 2016-02-10 | Discharge: 2016-02-10 | Disposition: A | Discharge status: Home / Self Care | Payer: Medicare Other | Source: Ambulatory Visit | Attending: Cardiology | Admitting: Cardiology

## 2016-02-10 DIAGNOSIS — I213 ST elevation (STEMI) myocardial infarction of unspecified site: Secondary | ICD-10-CM | POA: Diagnosis not present

## 2016-02-10 DIAGNOSIS — Z95810 Presence of automatic (implantable) cardiac defibrillator: Secondary | ICD-10-CM

## 2016-02-10 DIAGNOSIS — I2111 ST elevation (STEMI) myocardial infarction involving right coronary artery: Secondary | ICD-10-CM

## 2016-02-10 DIAGNOSIS — I2129 ST elevation (STEMI) myocardial infarction involving other sites: Secondary | ICD-10-CM | POA: Diagnosis not present

## 2016-02-10 DIAGNOSIS — Z955 Presence of coronary angioplasty implant and graft: Secondary | ICD-10-CM | POA: Diagnosis not present

## 2016-02-10 DIAGNOSIS — I491 Atrial premature depolarization: Secondary | ICD-10-CM | POA: Diagnosis not present

## 2016-02-12 ENCOUNTER — Encounter (HOSPITAL_COMMUNITY)
Admission: RE | Admit: 2016-02-12 | Discharge: 2016-02-12 | Disposition: A | Discharge status: Home / Self Care | Payer: Medicare Other | Source: Ambulatory Visit | Attending: Cardiology | Admitting: Cardiology

## 2016-02-12 DIAGNOSIS — Z95810 Presence of automatic (implantable) cardiac defibrillator: Secondary | ICD-10-CM

## 2016-02-12 DIAGNOSIS — I2129 ST elevation (STEMI) myocardial infarction involving other sites: Secondary | ICD-10-CM | POA: Diagnosis not present

## 2016-02-12 DIAGNOSIS — Z955 Presence of coronary angioplasty implant and graft: Secondary | ICD-10-CM | POA: Diagnosis not present

## 2016-02-12 DIAGNOSIS — I213 ST elevation (STEMI) myocardial infarction of unspecified site: Secondary | ICD-10-CM | POA: Diagnosis not present

## 2016-02-12 DIAGNOSIS — I491 Atrial premature depolarization: Secondary | ICD-10-CM | POA: Diagnosis not present

## 2016-02-12 DIAGNOSIS — I2111 ST elevation (STEMI) myocardial infarction involving right coronary artery: Secondary | ICD-10-CM

## 2016-02-14 ENCOUNTER — Encounter (HOSPITAL_COMMUNITY)
Admission: RE | Admit: 2016-02-14 | Discharge: 2016-02-14 | Disposition: A | Discharge status: Home / Self Care | Payer: Medicare Other | Source: Ambulatory Visit | Attending: Cardiology | Admitting: Cardiology

## 2016-02-14 DIAGNOSIS — I491 Atrial premature depolarization: Secondary | ICD-10-CM | POA: Diagnosis not present

## 2016-02-14 DIAGNOSIS — I2111 ST elevation (STEMI) myocardial infarction involving right coronary artery: Secondary | ICD-10-CM

## 2016-02-14 DIAGNOSIS — Z95810 Presence of automatic (implantable) cardiac defibrillator: Secondary | ICD-10-CM

## 2016-02-14 DIAGNOSIS — Z955 Presence of coronary angioplasty implant and graft: Secondary | ICD-10-CM | POA: Diagnosis not present

## 2016-02-14 DIAGNOSIS — I2129 ST elevation (STEMI) myocardial infarction involving other sites: Secondary | ICD-10-CM | POA: Diagnosis not present

## 2016-02-14 DIAGNOSIS — I213 ST elevation (STEMI) myocardial infarction of unspecified site: Secondary | ICD-10-CM | POA: Diagnosis not present

## 2016-02-17 ENCOUNTER — Encounter (HOSPITAL_COMMUNITY)
Admission: RE | Admit: 2016-02-17 | Discharge: 2016-02-17 | Disposition: A | Discharge status: Home / Self Care | Payer: Medicare Other | Source: Ambulatory Visit | Attending: Cardiology | Admitting: Cardiology

## 2016-02-17 DIAGNOSIS — Z955 Presence of coronary angioplasty implant and graft: Secondary | ICD-10-CM | POA: Diagnosis not present

## 2016-02-17 DIAGNOSIS — I2129 ST elevation (STEMI) myocardial infarction involving other sites: Secondary | ICD-10-CM | POA: Diagnosis not present

## 2016-02-17 DIAGNOSIS — I491 Atrial premature depolarization: Secondary | ICD-10-CM | POA: Diagnosis not present

## 2016-02-17 DIAGNOSIS — Z95810 Presence of automatic (implantable) cardiac defibrillator: Secondary | ICD-10-CM

## 2016-02-17 DIAGNOSIS — I213 ST elevation (STEMI) myocardial infarction of unspecified site: Secondary | ICD-10-CM | POA: Diagnosis not present

## 2016-02-17 DIAGNOSIS — I2111 ST elevation (STEMI) myocardial infarction involving right coronary artery: Secondary | ICD-10-CM

## 2016-02-18 NOTE — Progress Notes (Signed)
Cardiac Individual Treatment Plan  Patient Details  Name: Leonard [name] MRN: [id number] Date of Birth: Apr 19, 1945 Referring Provider:   Flowsheet Row CARDIAC REHAB PHASE II ORIENTATION from 10/10/2015 in [place]  Referring Provider  Glenetta Hew MD      Initial Encounter Date:  [place] PHASE II ORIENTATION from 10/10/2015 in [place]  Date  10/10/15  Referring Provider  Glenetta Hew MD      Visit Diagnosis: 08/12/15 ST elevation myocardial infarction involving right coronary artery (HCC)  S/P coronary artery stent placement  S/P ICD (internal cardiac defibrillator)08/26/2015  Patient's Home Medications on Admission:  Current Outpatient Prescriptions:  .  amLODipine (NORVASC) 5 MG tablet, TAKE 1 TABLET BY MOUTH DAILY, Disp: 90 tablet, Rfl: 0 .  aspirin EC 81 MG tablet, Take 1 tablet (81 mg total) by mouth daily., Disp: , Rfl:  .  atorvastatin (LIPITOR) 80 MG tablet, Take 1 tablet (80 mg total) by mouth daily at 6 PM., Disp: 30 tablet, Rfl: 12 .  cholecalciferol (VITAMIN D) 1000 units tablet, Take 2,000 Units by mouth daily. , Disp: , Rfl:  .  Coenzyme Q10 (COQ10) 200 MG CAPS, Take 200 mg by mouth daily., Disp: , Rfl:  .  irbesartan (AVAPRO) 300 MG tablet, Take 1 tablet (300 mg total) by mouth daily., Disp: 90 tablet, Rfl: 3 .  ketotifen (ZADITOR) 0.025 % ophthalmic solution, Place 1 drop into both eyes as needed (for dry eyes)., Disp: , Rfl:  .  magnesium oxide (MAG-OX) 400 MG tablet, Take 1 tablet (400 mg total) by mouth 2 (two) times daily., Disp: 60 tablet, Rfl: 6 .  metoprolol succinate (TOPROL-XL) 50 MG 24 hr tablet, Take 1 tablet (50 mg total) by mouth daily. Take with or immediately following a meal., Disp: 30 tablet, Rfl: 6 .  Multiple Vitamin (MULTIVITAMIN) tablet, Take 1 tablet by mouth daily., Disp: , Rfl:  .  nitroGLYCERIN (NITROSTAT) 0.4 MG SL tablet, Place 1 tablet (0.4 mg  total) under the tongue every 5 (five) minutes as needed for chest pain., Disp: 25 tablet, Rfl: 2 .  Omega-3 Fatty Acids (FISH OIL) 1200 MG CPDR, Take 1 tablet by mouth daily., Disp: , Rfl:  .  tadalafil (CIALIS) 20 MG tablet, Take 20 mg by mouth daily as needed for erectile dysfunction., Disp: , Rfl:  .  ticagrelor (BRILINTA) 90 MG TABS tablet, Take 1 tablet (90 mg total) by mouth 2 (two) times daily., Disp: 60 tablet, Rfl: 12 .  venlafaxine (EFFEXOR) 75 MG tablet, TAKE 1 TABLET BY MOUTH EVERY DAY, Disp: 30 tablet, Rfl: 0  Past Medical History: Past Medical History:  Diagnosis Date  . Anxiety   . Atherosclerotic heart disease of native coronary artery with angina pectoris ([place])    a. 07/2015 Posterior STEMI/PCI: LM nl, LAd 40ost, RI 40, RCA 133m(3.0x22 Resolute Integrity DES distal, 3.0x12 Resolute Integrity DES prox), 90d (PTCA), RPDA small, nl, RPLB2 100 - too small for PTCA. -- patent in f/u cath 08/2015  . Chronic combined systolic and diastolic CHF (congestive heart failure) ([place]    a. 07/2015 Ehco: EF 45-50%, basal-mid inf and infsept AK, inflat, apical inf, and apical septal HK, Gr1 DD, triv MR.  .Marland KitchenHyperlipidemia with target low density lipoprotein (LDL) cholesterol less than 70 mg/dL   . Hypertensive heart disease   . Ischemic cardiomyopathy    a. 07/2015 Echo: EF 45-50%.; Follow-up Echo 08/23/2015: EF 50-55% with mild  inferior hypokinesis.  . Morbid obesity (HCC)   . ST elevation myocardial infarction (STEMI) of inferior wall (HCC) 08/12/2015   Subacute presentation for inferior MI with post infarction angina on the following day. EKG still shows injury current. Therefore decided to proceed with intervention of 100% mRCA thrombotic lesion.  . Sustained ventricular fibrillation (HCC) 08/2015   Underwent EP evaluation with final ICD implantation.    Tobacco Use: History  Smoking Status  . Never Smoker  Smokeless Tobacco  . Never Used    Labs: Recent Review Flowsheet Data     Labs for ITP Cardiac and Pulmonary Rehab Latest Ref Rng & Units 04/18/2015 08/12/2015   Cholestrol 0 - 200 mg/dL - 163   LDLCALC 0 - 99 mg/dL - 98   HDL >40 mg/dL - 41   Trlycerides <150 mg/dL - 122   Hemoglobin A1c 4.8 - 5.6 % 6.1(H) 6.1(H)   TCO2 0 - 100 mmol/L - 24      Capillary Blood Glucose: No results found for: GLUCAP   Exercise Target Goals:    Exercise Program Goal: Individual exercise prescription set with THRR, safety & activity barriers. Participant demonstrates ability to understand and report RPE using BORG scale, to self-measure pulse accurately, and to acknowledge the importance of the exercise prescription.  Exercise Prescription Goal: Starting with aerobic activity 30 plus minutes a day, 3 days per week for initial exercise prescription. Provide home exercise prescription and guidelines that participant acknowledges understanding prior to discharge.  Activity Barriers & Risk Stratification:     Activity Barriers & Cardiac Risk Stratification - 10/10/15 0906      Activity Barriers & Cardiac Risk Stratification   Activity Barriers Deconditioning;Muscular Weakness;Joint Problems;Balance Concerns  B ankle, knee and foot pain from old sport injuries   Cardiac Risk Stratification High      6 Minute Walk:     6 Minute Walk    Row Name 10/10/15 1415 10/10/15 1444 01/29/16 1215     6 Minute Walk   Phase Initial Initial Discharge   Distance 1266 feet 832 feet 1182 feet   Distance % Change  -  - 42 %   Walk Time 6 minutes 6 minutes 6 minutes   # of Rest Breaks 0 0 0   MPH 2.39 1.57 2.2   METS 2.76 1.4 1.9   RPE 11 13 13   VO2 Peak 9.66 4.9 6.8   Symptoms No Yes (comment) No   Comments  - SOB, short run of V tach and 5 minute run of Sinus Tachy. Cardiologist notified  -   Resting HR 59 bpm 100 bpm 84 bpm   Resting BP 124/80 110/78 120/80   Max Ex. HR 121 bpm 122 bpm 106 bpm   Max Ex. BP 152/86 140/80 162/90   2 Minute Post BP 146/91  rchk 112 76 150/90  152/80      Initial Exercise Prescription:     Initial Exercise Prescription - 10/10/15 1400      Date of Initial Exercise RX and Referring Provider   Date 10/10/15   Referring Provider Harding, David MD     Recumbant Bike   Level 1   Watts --   Minutes 10   METs 1.5     NuStep   Level 1   Minutes 20   METs 1.4     Arm Ergometer   Level --   Watts --   Minutes --   METs --       Prescription Details   Frequency (times per week) 3   Duration Progress to 30 minutes of continuous aerobic without signs/symptoms of physical distress     Intensity   THRR 40-80% of Max Heartrate 59-118   Ratings of Perceived Exertion 11-13   Perceived Dyspnea 0-4     Progression   Progression Continue to progress workloads to maintain intensity without signs/symptoms of physical distress.     Resistance Training   Training Prescription Yes   Weight 1lb   Reps 10-12      Perform Capillary Blood Glucose checks as needed.  Exercise Prescription Changes:      Exercise Prescription Changes    Row Name 10/25/15 1400 11/06/15 1700 11/25/15 1700 12/23/15 1700 02/17/16 1200     Exercise Review   Progression Yes Yes Yes Yes Yes     Response to Exercise   Blood Pressure (Admit) 124/80 138/90 130/80 134/84 124/80   Blood Pressure (Exercise) 124/80 114/76 134/68 140/90 154/80   Blood Pressure (Exit) 124/80 138/78 124/78 128/80 128/68   Heart Rate (Admit) 91 bpm 95 bpm 85 bpm 84 bpm 81 bpm   Heart Rate (Exercise) 108 bpm 110 bpm 114 bpm 107 bpm 105 bpm   Heart Rate (Exit) 101 bpm 92 bpm 78 bpm 82 bpm 77 bpm   Rating of Perceived Exertion (Exercise) 12 12 12 12 12   Symptoms none none none none none   Duration Progress to 30 minutes of continuous aerobic without signs/symptoms of physical distress Progress to 30 minutes of continuous aerobic without signs/symptoms of physical distress Progress to 30 minutes of continuous aerobic without signs/symptoms of physical distress Progress to 30  minutes of continuous aerobic without signs/symptoms of physical distress Progress to 30 minutes of continuous aerobic without signs/symptoms of physical distress   Intensity THRR unchanged THRR unchanged THRR unchanged THRR unchanged THRR unchanged     Progression   Progression Continue to progress workloads to maintain intensity without signs/symptoms of physical distress. Continue to progress workloads to maintain intensity without signs/symptoms of physical distress. Continue to progress workloads to maintain intensity without signs/symptoms of physical distress. Continue to progress workloads to maintain intensity without signs/symptoms of physical distress. Continue to progress workloads to maintain intensity without signs/symptoms of physical distress.   Average METs 2.4 2.4 2.4 2.7 2.8     Resistance Training   Training Prescription Yes Yes Yes Yes Yes   Weight 1lb 1lb 5lbs 5lbs 5lbs   Reps 10-12 10-12 10-12 10-12 10-12     Interval Training   Interval Training No No No No No     Recumbant Bike   Level 2 2 -  -  -   Minutes 10 10 -  -  -   METs - 1.9 -  -  -     NuStep   Level 3 3 3 3 3   Minutes 10 10 20 20 20   METs 2.4 2.4 2.2 2.8 3.1     Track   Laps 8 8 5 9 8   Minutes 1 1 1 1 1   METs 2.39 2.39 1.84 2.6 2.4     Home Exercise Plan   Plans to continue exercise at  - Home Home Home Home   Frequency  - Add 3 additional days to program exercise sessions. Add 3 additional days to program exercise sessions. Add 3 additional days to program exercise sessions. Add 3 additional days to program exercise sessions.      Exercise   Comments:      Exercise Comments    Row Name 10/25/15 1353 11/06/15 1708 11/25/15 1706 12/23/15 1709 01/22/16 1213   Exercise Comments participant is walking 10 minutes, 3 days per week at home. pt is walking 10-20 minutes, 2-3 days/week at home Pt just returnted to exercise after two weeks after having a death in the family.  Pt is eager to get  back to exercising at the same level prior to his absence Reviewed METs and Goals with pt.  Pt continues to do well with exercise.   Have not reviewed METs and goals wiht pt.  Pt has been absent from exercise for a couple of weeks on vacation    [place] Name 02/14/16 1159 02/17/16 1212         Exercise Comments Reviewed METs and goals today. Pt states he's walking 10 minutes 2x's/day, 3 days/week. Encouraged pt to try increasing duration to 15 minutes 2x's day. He is also looking into getting a piece of equipment for home use, either a stationary bike or recubent stepper. Reviewed METs and goals with pt. Pt is doing well with exercise          Discharge Exercise Prescription (Final Exercise Prescription Changes):     Exercise Prescription Changes - 02/17/16 1200      Exercise Review   Progression Yes     Response to Exercise   Blood Pressure (Admit) 124/80   Blood Pressure (Exercise) 154/80   Blood Pressure (Exit) 128/68   Heart Rate (Admit) 81 bpm   Heart Rate (Exercise) 105 bpm   Heart Rate (Exit) 77 bpm   Rating of Perceived Exertion (Exercise) 12   Symptoms none   Duration Progress to 30 minutes of continuous aerobic without signs/symptoms of physical distress   Intensity THRR unchanged     Progression   Progression Continue to progress workloads to maintain intensity without signs/symptoms of physical distress.   Average METs 2.8     Resistance Training   Training Prescription Yes   Weight 5lbs   Reps 10-12     Interval Training   Interval Training No     NuStep   Level 3   Minutes 20   METs 3.1     Track   Laps 8   Minutes 1   METs 2.4     Home Exercise Plan   Plans to continue exercise at Home   Frequency Add 3 additional days to program exercise sessions.      Nutrition:  Target Goals: Understanding of nutrition guidelines, daily intake of sodium <1521m, cholesterol <2077m calories 30% from fat and 7% or less from saturated fats, daily to have 5 or more  servings of fruits and vegetables.  Biometrics:     Pre Biometrics - 10/10/15 1449      Pre Biometrics   Waist Circumference 53.5 inches   Hip Circumference 58 inches   Waist to Hip Ratio 0.92 %   Triceps Skinfold 38 mm   % Body Fat 42.9 %   Grip Strength 33 kg   Flexibility 0 in   Single Leg Stand 0 seconds         Post Biometrics - 01/29/16 1216       Post  Biometrics   Height 5' 10" (1.778 m)   Weight (!)  301 lb 13 oz (136.9 kg)   Waist Circumference 48.5 inches   Hip Circumference 53 inches   Waist to Hip Ratio 0.92 %   BMI (Calculated)  43.4   Triceps Skinfold 33 mm   % Body Fat 40.1 %   Grip Strength 36 kg   Flexibility 0 in   Single Leg Stand 0 seconds      Nutrition Therapy Plan and Nutrition Goals:     Nutrition Therapy & Goals - 10/11/15 1357      Nutrition Therapy   Diet Therapeutic Lifestyle Changes     Personal Nutrition Goals   Personal Goal #1 1-2 lb wt loss/week to a wt loss goal of 6-24 lb at graduation from Cardiac Rehab     Intervention Plan   Intervention Prescribe, educate and counsel regarding individualized specific dietary modifications aiming towards targeted core components such as weight, hypertension, lipid management, diabetes, heart failure and other comorbidities.   Expected Outcomes Short Term Goal: Understand basic principles of dietary content, such as calories, fat, sodium, cholesterol and nutrients.;Long Term Goal: Adherence to prescribed nutrition plan.      Nutrition Discharge: Nutrition Scores:     Nutrition Assessments - 11/04/15 1048      MEDFICTS Scores   Pre Score 18      Nutrition Goals Re-Evaluation:   Psychosocial: Target Goals: Acknowledge presence or absence of depression, maximize coping skills, provide positive support system. Participant is able to verbalize types and ability to use techniques and skills needed for reducing stress and depression.  Initial Review & Psychosocial Screening:      Initial Psych Review & Screening - 10/10/15 1440      Family Dynamics   Good Support System? Yes     Barriers   Psychosocial barriers to participate in program The patient should benefit from training in stress management and relaxation.     Screening Interventions   Interventions Encouraged to exercise      Quality of Life Scores:     Quality of Life - 10/16/15 1221      Quality of Life Scores   Family Pre --  Daughter is a quadraplegic. Receives around the clock care. Son just got out of rehab for opiod abuse.   GLOBAL Pre --  Mr Clendednin denies being depressed a t this time. QOL reviewed with the patient      PHQ-9: Recent Review Flowsheet Data    Depression screen PHQ 2/9 12/26/2015 10/16/2015   Decreased Interest 0 0   Down, Depressed, Hopeless 1  0   PHQ - 2 Score 1 0      Psychosocial Evaluation and Intervention:   Psychosocial Re-Evaluation:     Psychosocial Re-Evaluation    Row Name 10/31/15 1624 11/27/15 1818 12/26/15 1441 01/21/16 1621 02/18/16 1046     Psychosocial Re-Evaluation   Interventions Encouraged to attend Cardiac Rehabilitation for the exercise Encouraged to attend Cardiac Rehabilitation for the exercise;Relaxation education Stress management education;Relaxation education;Encouraged to attend Cardiac Rehabilitation for the exercise Stress management education;Encouraged to attend Cardiac Rehabilitation for the exercise;Relaxation education Encouraged to attend Cardiac Rehabilitation for the exercise;Stress management education   Comments  - -  Uel's daughter passed away last week. Emotional support offered.  - -  Dariush continues to deal with the loss of his daughter. Continued emotional support provided.  Martese continues to go through emotional ups and down with the loss of his daughter. Conintued emotional support provided..Joziah has declined to have any counselling through our hospital chaplain.   Continued Psychosocial Services Needed No  No No No  Giacomo declined the needed for counselling at this time. No      Vocational Rehabilitation:   Provide vocational rehab assistance to qualifying candidates.   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      Education: Education Goals: Education classes will be provided on a weekly basis, covering required topics. Participant will state understanding/return demonstration of topics presented.  Learning Barriers/Preferences:     Learning Barriers/Preferences - 10/10/15 1443      Learning Barriers/Preferences   Learning Preferences Audio;Computer/Internet;Group Instruction;Individual Instruction;Verbal Instruction      Education Topics: Count Your Pulse:  -Group instruction provided by verbal instruction, demonstration, patient participation and written materials to support subject.  Instructors address importance of being able to find your pulse and how to count your pulse when at home without a heart monitor.  Patients get hands on experience counting their pulse with staff help and individually.   Heart Attack, Angina, and Risk Factor Modification:  -Group instruction provided by verbal instruction, video, and written materials to support subject.  Instructors address signs and symptoms of angina and heart attacks.    Also discuss risk factors for heart disease and how to make changes to improve heart health risk factors. Flowsheet Row CARDIAC REHAB PHASE II EXERCISE from 01/01/2016 in [place]  Date  11/06/15  Instruction Review Code  2- meets goals/outcomes      Functional Fitness:  -Group instruction provided by verbal instruction, demonstration, patient participation, and written materials to support subject.   Instructors address safety measures for doing things around the house.  Discuss how to get up and down off the floor, how to pick things up properly, how to safely get out of a chair without assistance, and balance training.   Meditation and Mindfulness:  -Group instruction provided by verbal instruction, patient participation, and written materials to support subject.  Instructor addresses importance of mindfulness and meditation practice to help reduce stress and improve awareness.  Instructor also leads participants through a meditation exercise.    Stretching for Flexibility and Mobility:  -Group instruction provided by verbal instruction, patient participation, and written materials to support subject.  Instructors lead participants through series of stretches that are designed to increase flexibility thus improving mobility.  These stretches are additional exercise for major muscle groups that are typically performed during regular warm up and cool down. Flowsheet Row CARDIAC REHAB PHASE II EXERCISE from 01/01/2016 in [place]  Date  12/25/15  Instruction Review Code  2- meets goals/outcomes      Hands Only CPR Anytime:  -Group instruction provided by verbal instruction, video, patient participation and written materials to support subject.  Instructors co-teach with AHA video for hands only CPR.  Participants get hands on experience with mannequins.   Nutrition I class: Heart Healthy Eating:  -Group instruction provided by PowerPoint slides, verbal discussion, and written materials to support subject matter. The instructor gives an explanation and review of the Therapeutic Lifestyle Changes diet recommendations, which includes a discussion on lipid goals, dietary fat, sodium, fiber, plant stanol/sterol esters, sugar, and the components of a well-balanced, healthy diet. Flowsheet Row CARDIAC REHAB PHASE II EXERCISE from 01/01/2016 in [place]  Date  10/10/15  Educator  RD  Instruction Review Code  Not applicable [Class handouts given]  Nutrition II class: Lifestyle Skills:  -Group instruction provided by PowerPoint slides, verbal discussion, and written materials to support subject matter. The instructor gives an explanation and review of label reading, grocery shopping for heart health, heart healthy recipe modifications, and ways to make healthier choices when eating out. Flowsheet Row CARDIAC REHAB PHASE II EXERCISE from 01/01/2016 in [facility]  Date  10/10/15  Educator  RD  Instruction Review Code  Not applicable [class handouts given]      Diabetes Question & Answer:  -Group instruction provided by PowerPoint slides, verbal discussion, and written materials to support subject matter. The instructor gives an explanation and review of diabetes co-morbidities, pre- and post-prandial blood glucose goals, pre-exercise blood glucose goals, signs, symptoms, and treatment of hypoglycemia and hyperglycemia, and foot care basics. Flowsheet Row CARDIAC REHAB PHASE II EXERCISE from 01/01/2016 in [facility]  Date  11/01/15  Educator  RD  Instruction Review Code  2- meets goals/outcomes      Diabetes Blitz:  -Group instruction provided by PowerPoint slides, verbal discussion, and written materials to support subject matter. The instructor gives an explanation and review of the physiology behind type 1 and type 2 diabetes, diabetes medications and rational behind using different medications, pre- and post-prandial blood glucose recommendations and Hemoglobin A1c goals, diabetes diet, and exercise including blood glucose guidelines for exercising safely.    Portion Distortion:  -Group instruction provided by PowerPoint slides, verbal discussion, written materials, and food models to support subject matter. The instructor gives an explanation of  serving size versus portion size, changes in portions sizes over the last 20 years, and what consists of a serving from each food group. Flowsheet Row CARDIAC REHAB PHASE II EXERCISE from 01/01/2016 in [facility]  Date  12/18/15 [Holiday Eating Survival Tips]  Educator  RD  Instruction Review Code  2- meets goals/outcomes      Stress Management:  -Group instruction provided by verbal instruction, video, and written materials to support subject matter.  Instructors review role of stress in heart disease and how to cope with stress positively.   Flowsheet Row CARDIAC REHAB PHASE II EXERCISE from 01/01/2016 in [facility]  Date  12/04/15  Instruction Review Code  2- meets goals/outcomes      Exercising on Your Own:  -Group instruction provided by verbal instruction, power point, and written materials to support subject.  Instructors discuss benefits of exercise, components of exercise, frequency and intensity of exercise, and end points for exercise.  Also discuss use of nitroglycerin and activating EMS.  Review options of places to exercise outside of rehab.  Review guidelines for sex with heart disease.   Cardiac Drugs I:  -Group instruction provided by verbal instruction and written materials to support subject.  Instructor reviews cardiac drug classes: antiplatelets, anticoagulants, beta blockers, and statins.  Instructor discusses reasons, side effects, and lifestyle considerations for each drug class. Flowsheet Row CARDIAC REHAB PHASE II EXERCISE from 01/01/2016 in [facility]  Date  01/01/16  Educator  pharmacist  Instruction Review Code  2- meets goals/outcomes      Cardiac Drugs II:  -Group instruction provided by verbal instruction and written materials to support subject.  Instructor reviews cardiac drug classes: angiotensin converting enzyme inhibitors (ACE-I), angiotensin II receptor  blockers (ARBs), nitrates, and calcium channel blockers.  Instructor discusses reasons, side effects, and lifestyle considerations for each drug class.   Flowsheet Row CARDIAC REHAB PHASE II EXERCISE from 01/01/2016 in [facility]  Date  11/27/15  Educator  Jackie Roh RPH  Instruction Review Code  2- meets goals/outcomes      Anatomy and Physiology of the Circulatory System:  -Group instruction provided by verbal instruction, video, and written materials to support subject.  Reviews functional anatomy of heart, how it relates to various diagnoses, and what role the heart plays in the overall system.   Knowledge Questionnaire Score:     Knowledge Questionnaire Score - 10/10/15 1415      Knowledge Questionnaire Score   Pre Score 19/24      Core Components/Risk Factors/Patient Goals at Admission:     Personal Goals and Risk Factors at Admission - 10/10/15 1450      Core Components/Risk Factors/Patient Goals on Admission   Heart Failure Yes   Intervention Provide a combined exercise and nutrition program that is supplemented with education, support and counseling about heart failure. Directed toward relieving symptoms such as shortness of breath, decreased exercise tolerance, and extremity edema.   Expected Outcomes Improve functional capacity of life;Short term: Daily weights obtained and reported for increase. Utilizing diuretic protocols set by physician.;Long term: Adoption of self-care skills and reduction of barriers for early signs and symptoms recognition and intervention leading to self-care maintenance.;Short term: Attendance in program 2-3 days a week with increased exercise capacity. Reported lower sodium intake. Reported increased fruit and vegetable intake. Reports medication compliance.      Core Components/Risk Factors/Patient Goals Review:      Goals and Risk Factor Review    Row Name 10/25/15 1354 11/06/15 1708 12/23/15 1709 02/14/16  1000 02/17/16 1212     Core Components/Risk Factors/Patient Goals Review   Personal Goals Review (P)  Sedentary Other Other Other;Increase Strength and Stamina Other;Increase Strength and Stamina   Review (P)  Participant is off to a good start with exercise. Increasing workloads as tolerated. Reviewed goals and home exercise program.  Pt states he feels stronger and his breathing has improved w/ exertion Pt states he feels he's able to walk better and he does 2-3 10 minute bouts of walking everday.   Pt continues to walk 10 minutes, 2x's/ day, 3days/week. Encouraged to increase duration. Pt feels his heart is getting stronger and that his strength and function has improved. Pt states he "feels good". 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 Pt states that he feels his heart is stronger because he "feels good" and he's improved his functional ability   Expected Outcomes  - Continue with exercise routine and increase workload as tolerated.  Continue with exercise routine and increase workload as tolerated.  Increase workloads as tolerated, increase duration of home exercise program as tolerated. Increase workloads as tolerated, increase duration of home exercise program as tolerated.      Core Components/Risk Factors/Patient Goals at Discharge (Final Review):      Goals and Risk Factor Review - 02/17/16 1212      Core Components/Risk Factors/Patient Goals Review   Personal Goals Review Other;Increase Strength and Stamina   Review Pt states that he feels his heart is stronger because he "feels good" and he's improved his functional ability   Expected Outcomes Increase workloads as tolerated, increase duration of home exercise program as tolerated.      ITP Comments:     ITP Comments    Row Name 10/10/15 0901 10/25/15 1108   10/25/15 1139       ITP Comments Dr. Fransico Him, Medical Director Pt attended Taking the high out of high blood  pressure 10/25/15 attended Hypertension education video/lecture class, met outcomes/goals        Comments: Trini  is making expected progress toward personal goals now after completing 28 sessions. Alfredo has extended his time of participation in the program due to his extended time out needed to deal with his youngest daughter's recent passing in October of 2017. Heliodoro's graduation date will be on March 06, 2016. Gurdeep is enjoying participating in phase 2 cardiac rehab and has recently been walking on the track without a cane. Recommend continued exercise and life style modification education including  stress management and relaxation techniques to decrease cardiac risk profile. Barnet Pall, RN,BSN 02/19/2016 5:30 PM

## 2016-02-19 ENCOUNTER — Encounter (HOSPITAL_COMMUNITY)
Admission: RE | Admit: 2016-02-19 | Discharge: 2016-02-19 | Disposition: A | Discharge status: Home / Self Care | Payer: Medicare Other | Source: Ambulatory Visit | Attending: Cardiology | Admitting: Cardiology

## 2016-02-19 DIAGNOSIS — I2129 ST elevation (STEMI) myocardial infarction involving other sites: Secondary | ICD-10-CM | POA: Diagnosis not present

## 2016-02-19 DIAGNOSIS — I2111 ST elevation (STEMI) myocardial infarction involving right coronary artery: Secondary | ICD-10-CM

## 2016-02-19 DIAGNOSIS — I491 Atrial premature depolarization: Secondary | ICD-10-CM | POA: Diagnosis not present

## 2016-02-19 DIAGNOSIS — Z95810 Presence of automatic (implantable) cardiac defibrillator: Secondary | ICD-10-CM

## 2016-02-19 DIAGNOSIS — Z955 Presence of coronary angioplasty implant and graft: Secondary | ICD-10-CM | POA: Diagnosis not present

## 2016-02-19 DIAGNOSIS — I213 ST elevation (STEMI) myocardial infarction of unspecified site: Secondary | ICD-10-CM | POA: Diagnosis not present

## 2016-02-21 ENCOUNTER — Encounter (HOSPITAL_COMMUNITY)
Admission: RE | Admit: 2016-02-21 | Discharge: 2016-02-21 | Disposition: A | Discharge status: Home / Self Care | Payer: Medicare Other | Source: Ambulatory Visit | Attending: Cardiology | Admitting: Cardiology

## 2016-02-21 DIAGNOSIS — I491 Atrial premature depolarization: Secondary | ICD-10-CM | POA: Insufficient documentation

## 2016-02-21 DIAGNOSIS — Z955 Presence of coronary angioplasty implant and graft: Secondary | ICD-10-CM | POA: Insufficient documentation

## 2016-02-21 DIAGNOSIS — I2129 ST elevation (STEMI) myocardial infarction involving other sites: Secondary | ICD-10-CM | POA: Insufficient documentation

## 2016-02-21 DIAGNOSIS — I213 ST elevation (STEMI) myocardial infarction of unspecified site: Secondary | ICD-10-CM | POA: Insufficient documentation

## 2016-02-21 DIAGNOSIS — Z95810 Presence of automatic (implantable) cardiac defibrillator: Secondary | ICD-10-CM

## 2016-02-21 DIAGNOSIS — I2111 ST elevation (STEMI) myocardial infarction involving right coronary artery: Secondary | ICD-10-CM

## 2016-02-24 ENCOUNTER — Encounter (HOSPITAL_COMMUNITY)
Admission: RE | Admit: 2016-02-24 | Discharge: 2016-02-24 | Disposition: A | Discharge status: Home / Self Care | Payer: Medicare Other | Source: Ambulatory Visit | Attending: Cardiology | Admitting: Cardiology

## 2016-02-24 VITALS — Wt 301.8 lb

## 2016-02-24 DIAGNOSIS — I213 ST elevation (STEMI) myocardial infarction of unspecified site: Secondary | ICD-10-CM | POA: Diagnosis not present

## 2016-02-24 DIAGNOSIS — I2129 ST elevation (STEMI) myocardial infarction involving other sites: Secondary | ICD-10-CM | POA: Diagnosis not present

## 2016-02-24 DIAGNOSIS — Z955 Presence of coronary angioplasty implant and graft: Secondary | ICD-10-CM | POA: Diagnosis not present

## 2016-02-24 DIAGNOSIS — Z95810 Presence of automatic (implantable) cardiac defibrillator: Secondary | ICD-10-CM

## 2016-02-24 DIAGNOSIS — I491 Atrial premature depolarization: Secondary | ICD-10-CM | POA: Diagnosis not present

## 2016-02-24 DIAGNOSIS — I2111 ST elevation (STEMI) myocardial infarction involving right coronary artery: Secondary | ICD-10-CM

## 2016-02-26 ENCOUNTER — Encounter (HOSPITAL_COMMUNITY)
Admission: RE | Admit: 2016-02-26 | Discharge: 2016-02-26 | Disposition: A | Discharge status: Home / Self Care | Payer: Medicare Other | Source: Ambulatory Visit | Attending: Cardiology | Admitting: Cardiology

## 2016-02-26 ENCOUNTER — Encounter (INDEPENDENT_AMBULATORY_CARE_PROVIDER_SITE_OTHER): Payer: Self-pay

## 2016-02-26 DIAGNOSIS — Z95810 Presence of automatic (implantable) cardiac defibrillator: Secondary | ICD-10-CM

## 2016-02-26 DIAGNOSIS — I491 Atrial premature depolarization: Secondary | ICD-10-CM | POA: Diagnosis not present

## 2016-02-26 DIAGNOSIS — I2129 ST elevation (STEMI) myocardial infarction involving other sites: Secondary | ICD-10-CM | POA: Diagnosis not present

## 2016-02-26 DIAGNOSIS — I213 ST elevation (STEMI) myocardial infarction of unspecified site: Secondary | ICD-10-CM | POA: Diagnosis not present

## 2016-02-26 DIAGNOSIS — Z955 Presence of coronary angioplasty implant and graft: Secondary | ICD-10-CM | POA: Diagnosis not present

## 2016-02-26 DIAGNOSIS — I2111 ST elevation (STEMI) myocardial infarction involving right coronary artery: Secondary | ICD-10-CM

## 2016-02-27 ENCOUNTER — Ambulatory Visit (INDEPENDENT_AMBULATORY_CARE_PROVIDER_SITE_OTHER): Payer: Medicare Other | Admitting: Family Medicine

## 2016-02-27 ENCOUNTER — Encounter: Payer: Self-pay | Admitting: Family Medicine

## 2016-02-27 VITALS — BP 128/80 | HR 93 | Temp 98.6°F | Wt 301.2 lb

## 2016-02-27 DIAGNOSIS — M549 Dorsalgia, unspecified: Secondary | ICD-10-CM | POA: Diagnosis not present

## 2016-02-27 DIAGNOSIS — F4320 Adjustment disorder, unspecified: Secondary | ICD-10-CM | POA: Diagnosis not present

## 2016-02-27 DIAGNOSIS — I10 Essential (primary) hypertension: Secondary | ICD-10-CM | POA: Diagnosis not present

## 2016-02-27 DIAGNOSIS — F4321 Adjustment disorder with depressed mood: Secondary | ICD-10-CM

## 2016-02-27 DIAGNOSIS — J3489 Other specified disorders of nose and nasal sinuses: Secondary | ICD-10-CM

## 2016-02-27 LAB — CBC WITH DIFFERENTIAL/PLATELET
BASOS PCT: 0 %
Basophils Absolute: 0 cells/uL (ref 0–200)
EOS ABS: 320 {cells}/uL (ref 15–500)
Eosinophils Relative: 4 %
HCT: 44.8 % (ref 38.5–50.0)
Hemoglobin: 15 g/dL (ref 13.2–17.1)
LYMPHS PCT: 15 %
Lymphs Abs: 1200 cells/uL (ref 850–3900)
MCH: 32.3 pg (ref 27.0–33.0)
MCHC: 33.5 g/dL (ref 32.0–36.0)
MCV: 96.3 fL (ref 80.0–100.0)
MONOS PCT: 12 %
MPV: 9.6 fL (ref 7.5–12.5)
Monocytes Absolute: 960 cells/uL — ABNORMAL HIGH (ref 200–950)
Neutro Abs: 5520 cells/uL (ref 1500–7800)
Neutrophils Relative %: 69 %
PLATELETS: 199 10*3/uL (ref 140–400)
RBC: 4.65 MIL/uL (ref 4.20–5.80)
RDW: 14.3 % (ref 11.0–15.0)
WBC: 8 10*3/uL (ref 4.0–10.5)

## 2016-02-27 LAB — BASIC METABOLIC PANEL
BUN: 28 mg/dL — AB (ref 7–25)
CHLORIDE: 101 mmol/L (ref 98–110)
CO2: 21 mmol/L (ref 20–31)
CREATININE: 1.38 mg/dL — AB (ref 0.70–1.18)
Calcium: 8.7 mg/dL (ref 8.6–10.3)
Glucose, Bld: 104 mg/dL — ABNORMAL HIGH (ref 65–99)
Potassium: 4.8 mmol/L (ref 3.5–5.3)
Sodium: 133 mmol/L — ABNORMAL LOW (ref 135–146)

## 2016-02-27 NOTE — Progress Notes (Signed)
   Subjective:    Patient ID: Leonard [name], male    DOB: [birth year], 71 y.o.   MRN: [id number]  HPI Chief Complaint  Patient presents with  . med check    med check, back spasms- off and on   He is here for a med check. His daughter passed away on Halloween and he is grieving but states he is managing ok and has a good support network. He is taking Effexor. Stable on this. Is not seeing a therapist.   States he is going to [facility] and doing well. States his blood pressure has been normal when they check it at rehab so . Reports yesterday his BP was 122/64 at cardiac rehab. Reports good medication compliance.  States his HR has been in the 80s and 90s. He does have a defibrillator  No chest pain, palpitations, DOE, orthopnea, abdominal pain, nausea, vomiting, diarrhea, urinary symptoms or LE edema.   Complains of having brief intermittent left mid back pains that feels like a "catch". States this comes on with movement and goes away within a couple of seconds. This is not keeping him awake at night or preventing him from his daily activities. This has been ongoing for several days. Not getting worse.   Complains of clear nasal drainage for the past 3 days. No other URI symptoms. Denies fever, chills, body aches, ear pain, nasal congestion, sore throat, cough.   Reviewed allergies, medications, past medical, surgical, and social history.   Review of Systems Pertinent positives and negatives in the history of present illness.     Objective:   Physical Exam  Constitutional: He is oriented to person, place, and time. He appears well-developed and well-nourished. No distress.  HENT:  Mouth/Throat: Oropharynx is clear and moist.  Eyes: Conjunctivae and EOM are normal. Pupils are equal, round, and reactive to light.  Cardiovascular: Normal rate and regular rhythm.   Pulmonary/Chest: Effort normal and breath sounds normal.  Musculoskeletal:       Thoracic back: Normal.   Lumbar back: Normal.  Lymphadenopathy:    He has no cervical adenopathy.  Neurological: He is alert and oriented to person, place, and time.  Skin: Skin is warm and dry. No rash noted. No pallor.  Psychiatric: He has a normal mood and affect. His speech is normal and behavior is normal. Thought content normal.   BP 128/80   Pulse 93   Temp 98.6 F (37 C) (Oral)   Wt (!) 301 lb 3.2 oz (136.6 kg)   SpO2 98%   BMI 43.22 kg/m       Assessment & Plan:  Essential hypertension - Plan: Basic metabolic panel, CBC with Differential/Platelet  Morbid obesity (HCC)  Mid back pain on left side - Plan: Ambulatory referral to Physical Therapy  Grief  Rhinorrhea  Discussed that his BP appears to be doing well. Continue on current medication. Watch diet. Continue with cardiac rehab.  Intermittent back twitches are not preventing him from his daily activities. Plan to refer him for back rehab to learn proper back care and exercises.  Offered to refer for grief counseling but he states he is doing ok for now and will let me know if he decides to do this.  He appears to have a cold and discussed supportive therapy. He will let me know if symptoms worsen.  Follow up pending labs.  Needs AWV and fasting labs in the next 1-2 months.

## 2016-02-27 NOTE — Patient Instructions (Addendum)
Continue with cardiac rehab. Let me know if you blood pressure readings are >130/80 on more than one occasion.  You will get a phone call to schedule back rehab.   We will call you with lab results.   Let's see you back for a medicare wellness visit and fasting labs in the next couple of months.   DASH Eating Plan DASH stands for "Dietary Approaches to Stop Hypertension." The DASH eating plan is a healthy eating plan that has been shown to reduce high blood pressure (hypertension). Additional health benefits may include reducing the risk of type 2 diabetes mellitus, heart disease, and stroke. The DASH eating plan may also help with weight loss. What do I need to know about the DASH eating plan? For the DASH eating plan, you will follow these general guidelines:  Choose foods with less than 150 milligrams of sodium per serving (as listed on the food label).  Use salt-free seasonings or herbs instead of table salt or sea salt.  Check with your health care provider or pharmacist before using salt substitutes.  Eat lower-sodium products. These are often labeled as "low-sodium" or "no salt added."  Eat fresh foods. Avoid eating a lot of canned foods.  Eat more vegetables, fruits, and low-fat dairy products.  Choose whole grains. Look for the word "whole" as the first word in the ingredient list.  Choose fish and skinless chicken or [place] more often than red meat. Limit fish, poultry, and meat to 6 oz (170 g) each day.  Limit sweets, desserts, sugars, and sugary drinks.  Choose heart-healthy fats.  Eat more home-cooked food and less restaurant, buffet, and fast food.  Limit fried foods.  Do not fry foods. Cook foods using methods such as baking, boiling, grilling, and broiling instead.  When eating at a restaurant, ask that your food be prepared with less salt, or no salt if possible. What foods can I eat? Seek help from a dietitian for individual calorie needs. Grains  Whole  grain or whole wheat bread. Brown rice. Whole grain or whole wheat pasta. Quinoa, bulgur, and whole grain cereals. Low-sodium cereals. Corn or whole wheat flour tortillas. Whole grain cornbread. Whole grain crackers. Low-sodium crackers. Vegetables  Fresh or frozen vegetables (raw, steamed, roasted, or grilled). Low-sodium or reduced-sodium tomato and vegetable juices. Low-sodium or reduced-sodium tomato sauce and paste. Low-sodium or reduced-sodium canned vegetables. Fruits  All fresh, canned (in natural juice), or frozen fruits. Meat and Other Protein Products  Ground beef (85% or leaner), grass-fed beef, or beef trimmed of fat. Skinless chicken or [place]. Ground chicken or [place]. Pork trimmed of fat. All fish and seafood. Eggs. Dried beans, peas, or lentils. Unsalted nuts and seeds. Unsalted canned beans. Dairy  Low-fat dairy products, such as skim or 1% milk, 2% or reduced-fat cheeses, low-fat ricotta or cottage cheese, or plain low-fat yogurt. Low-sodium or reduced-sodium cheeses. Fats and Oils  Tub margarines without trans fats. Light or reduced-fat mayonnaise and salad dressings (reduced sodium). Avocado. Safflower, olive, or canola oils. Natural peanut or almond butter. Other  Unsalted popcorn and pretzels. The items listed above may not be a complete list of recommended foods or beverages. Contact your dietitian for more options.  What foods are not recommended? Grains  White bread. White pasta. White rice. Refined cornbread. Bagels and croissants. Crackers that contain trans fat. Vegetables  Creamed or fried vegetables. Vegetables in a cheese sauce. Regular canned vegetables. Regular canned tomato sauce and paste. Regular tomato and vegetable juices. Fruits  Canned fruit in light or heavy syrup. Fruit juice. Meat and Other Protein Products  Fatty cuts of meat. Ribs, chicken wings, bacon, sausage, bologna, salami, chitterlings, fatback, hot dogs, bratwurst, and packaged luncheon  meats. Salted nuts and seeds. Canned beans with salt. Dairy  Whole or 2% milk, cream, half-and-half, and cream cheese. Whole-fat or sweetened yogurt. Full-fat cheeses or blue cheese. Nondairy creamers and whipped toppings. Processed cheese, cheese spreads, or cheese curds. Condiments  Onion and garlic salt, seasoned salt, table salt, and sea salt. Canned and packaged gravies. Worcestershire sauce. Tartar sauce. Barbecue sauce. Teriyaki sauce. Soy sauce, including reduced sodium. Steak sauce. Fish sauce. Oyster sauce. Cocktail sauce. Horseradish. Ketchup and mustard. Meat flavorings and tenderizers. Bouillon cubes. Hot sauce. Tabasco sauce. Marinades. Taco seasonings. Relishes. Fats and Oils  Butter, stick margarine, lard, shortening, ghee, and bacon fat. Coconut, palm kernel, or palm oils. Regular salad dressings. Other  Pickles and olives. Salted popcorn and pretzels. The items listed above may not be a complete list of foods and beverages to avoid. Contact your dietitian for more information.  Where can I find more information? National Heart, Lung, and Blood Institute: travelstabloid.com This information is not intended to replace advice given to you by your health care provider. Make sure you discuss any questions you have with your health care provider. Document Released: 12/25/2010 Document Revised: 06/13/2015 Document Reviewed: 11/09/2012 Elsevier Interactive Patient Education  2017 [employer].

## 2016-02-28 ENCOUNTER — Encounter (HOSPITAL_COMMUNITY)
Admission: RE | Admit: 2016-02-28 | Discharge: 2016-02-28 | Disposition: A | Discharge status: Home / Self Care | Payer: Medicare Other | Source: Ambulatory Visit | Attending: Cardiology | Admitting: Cardiology

## 2016-02-28 DIAGNOSIS — I2111 ST elevation (STEMI) myocardial infarction involving right coronary artery: Secondary | ICD-10-CM

## 2016-02-28 DIAGNOSIS — Z955 Presence of coronary angioplasty implant and graft: Secondary | ICD-10-CM | POA: Diagnosis not present

## 2016-02-28 DIAGNOSIS — I491 Atrial premature depolarization: Secondary | ICD-10-CM | POA: Diagnosis not present

## 2016-02-28 DIAGNOSIS — I2129 ST elevation (STEMI) myocardial infarction involving other sites: Secondary | ICD-10-CM | POA: Diagnosis not present

## 2016-02-28 DIAGNOSIS — I213 ST elevation (STEMI) myocardial infarction of unspecified site: Secondary | ICD-10-CM | POA: Diagnosis not present

## 2016-02-28 DIAGNOSIS — Z95810 Presence of automatic (implantable) cardiac defibrillator: Secondary | ICD-10-CM

## 2016-03-04 ENCOUNTER — Other Ambulatory Visit: Payer: Self-pay | Admitting: Family Medicine

## 2016-03-04 ENCOUNTER — Encounter (HOSPITAL_COMMUNITY)
Admission: RE | Admit: 2016-03-04 | Discharge: 2016-03-04 | Disposition: A | Discharge status: Home / Self Care | Payer: Medicare Other | Source: Ambulatory Visit | Attending: Cardiology | Admitting: Cardiology

## 2016-03-04 DIAGNOSIS — Z955 Presence of coronary angioplasty implant and graft: Secondary | ICD-10-CM | POA: Diagnosis not present

## 2016-03-04 DIAGNOSIS — Z95810 Presence of automatic (implantable) cardiac defibrillator: Secondary | ICD-10-CM

## 2016-03-04 DIAGNOSIS — I2129 ST elevation (STEMI) myocardial infarction involving other sites: Secondary | ICD-10-CM | POA: Diagnosis not present

## 2016-03-04 DIAGNOSIS — I2111 ST elevation (STEMI) myocardial infarction involving right coronary artery: Secondary | ICD-10-CM

## 2016-03-04 DIAGNOSIS — I213 ST elevation (STEMI) myocardial infarction of unspecified site: Secondary | ICD-10-CM | POA: Diagnosis not present

## 2016-03-04 DIAGNOSIS — I491 Atrial premature depolarization: Secondary | ICD-10-CM | POA: Diagnosis not present

## 2016-03-04 NOTE — Telephone Encounter (Signed)
Ok to refill 

## 2016-03-04 NOTE — Telephone Encounter (Signed)
Is this okay to refill? Pt was just here

## 2016-03-05 ENCOUNTER — Encounter: Payer: Self-pay | Admitting: Internal Medicine

## 2016-03-06 ENCOUNTER — Encounter (HOSPITAL_COMMUNITY)
Admission: RE | Admit: 2016-03-06 | Discharge: 2016-03-06 | Disposition: A | Discharge status: Home / Self Care | Payer: Medicare Other | Source: Ambulatory Visit | Attending: Cardiology | Admitting: Cardiology

## 2016-03-06 VITALS — Ht 70.0 in | Wt 301.8 lb

## 2016-03-06 DIAGNOSIS — I491 Atrial premature depolarization: Secondary | ICD-10-CM | POA: Diagnosis not present

## 2016-03-06 DIAGNOSIS — Z955 Presence of coronary angioplasty implant and graft: Secondary | ICD-10-CM | POA: Diagnosis not present

## 2016-03-06 DIAGNOSIS — I2111 ST elevation (STEMI) myocardial infarction involving right coronary artery: Secondary | ICD-10-CM

## 2016-03-06 DIAGNOSIS — Z95810 Presence of automatic (implantable) cardiac defibrillator: Secondary | ICD-10-CM

## 2016-03-06 DIAGNOSIS — I2129 ST elevation (STEMI) myocardial infarction involving other sites: Secondary | ICD-10-CM | POA: Diagnosis not present

## 2016-03-06 DIAGNOSIS — I213 ST elevation (STEMI) myocardial infarction of unspecified site: Secondary | ICD-10-CM | POA: Diagnosis not present

## 2016-03-06 NOTE — Progress Notes (Signed)
Discharge Summary  Patient Details  Name: Leonard [name] MRN: [id number] Date of Birth: 07/13/1945 Referring Provider:   Flowsheet Row CARDIAC REHAB PHASE II ORIENTATION from 10/10/2015 in [place]  Referring Provider  Glenetta Hew MD       Number of Visits: 35  Reason for Discharge:  Patient reached a stable level of exercise.  Smoking History:  History  Smoking Status  . Never Smoker  Smokeless Tobacco  . 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)  S/P coronary artery stent placement  S/P ICD (internal cardiac defibrillator)08/26/2015  ADL UCSD:   Initial Exercise Prescription:     Initial Exercise Prescription - 10/10/15 1400      Date of Initial Exercise RX and Referring Provider   Date 10/10/15   Referring Provider Glenetta Hew MD     Recumbant Bike   Level 1   Watts --   Minutes 10   METs 1.5     NuStep   Level 1   Minutes 20   METs 1.4     Arm Ergometer   Level --   Watts --   Minutes --   METs --     Prescription Details   Frequency (times per week) 3   Duration Progress to 30 minutes of continuous aerobic without signs/symptoms of physical distress     Intensity   THRR 40-80% of Max Heartrate 59-118   Ratings of Perceived Exertion 11-13   Perceived Dyspnea 0-4     Progression   Progression Continue to progress workloads to maintain intensity without signs/symptoms of physical distress.     Resistance Training   Training Prescription Yes   Weight 1lb   Reps 10-12      Discharge Exercise Prescription (Final Exercise Prescription Changes):     Exercise Prescription Changes - 03/09/16 1400      Response to Exercise   Blood Pressure (Admit) 132/68   Blood Pressure (Exercise) 134/84   Blood Pressure (Exit) 128/62   Heart Rate (Admit) 84 bpm   Heart Rate (Exercise) 112 bpm   Heart Rate (Exit) 83 bpm   Rating of Perceived  Exertion (Exercise) 12   Symptoms none   Duration Progress to 30 minutes of continuous aerobic without signs/symptoms of physical distress   Intensity THRR unchanged     Progression   Progression Continue to progress workloads to maintain intensity without signs/symptoms of physical distress.   Average METs 2.8     Resistance Training   Training Prescription Yes   Weight 5lbs   Reps 10-12     Interval Training   Interval Training No     NuStep   Level 3   Minutes 20   METs 3.1     Track   Laps 8   Minutes 1   METs 2.4     Home Exercise Plan   Plans to continue exercise at Home   Frequency Add 3 additional days to program exercise sessions.     Exercise Review   Progression Yes      Functional Capacity:     6 Minute Walk    Row Name 10/10/15 1415 10/10/15 1444 01/29/16 1215     6 Minute Walk   Phase Initial Initial Discharge   Distance 1266 feet 832 feet 1182 feet   Distance % Change  -  - 42 %   Walk Time 6 minutes 6 minutes 6  minutes   # of Rest Breaks 0 0 0   MPH 2.39 1.57 2.2   METS 2.76 1.4 1.9   RPE 11 13 13    VO2 Peak 9.66 4.9 6.8   Symptoms No Yes (comment) No   Comments  - SOB, short run of V tach and 5 minute run of Sinus Tachy. Cardiologist notified  -   Resting HR 59 bpm 100 bpm 84 bpm   Resting BP 124/80 110/78 120/80   Max Ex. HR 121 bpm 122 bpm 106 bpm   Max Ex. BP 152/86 140/80 162/90   2 Minute Post BP 146/91  rchk 112 76 150/90 152/80      Psychological, QOL, Others - Outcomes: PHQ 2/9: Depression screen [facility] 2/9 03/06/2016 12/26/2015 10/16/2015  Decreased Interest 0 0 0  Down, Depressed, Hopeless 0 1 0  PHQ - 2 Score 0 1 0    Quality of Life:     Quality of Life - 03/09/16 1426      Quality of Life Scores   Health/Function Pre 27.17 %   Health/Function Post 25.97 %   Health/Function % Change -4.42 %   Socioeconomic Pre 27.94 %   Socioeconomic Post 27.07 %   Socioeconomic % Change  -3.11 %   Psych/Spiritual Pre 28.79 %    Psych/Spiritual Post 26.36 %   Psych/Spiritual % Change -8.44 %   Family Pre 19.2 %   Family Post 18.7 %   Family % Change -2.6 %   GLOBAL Pre 26.53 %   GLOBAL Post 25.21 %   GLOBAL % Change -4.98 %      Personal Goals: Goals established at orientation with interventions provided to work toward goal.     Personal Goals and Risk Factors at Admission - 10/10/15 1450      Core Components/Risk Factors/Patient Goals on Admission   Heart Failure Yes   Intervention Provide a combined exercise and nutrition program that is supplemented with education, support and counseling about heart failure. Directed toward relieving symptoms such as shortness of breath, decreased exercise tolerance, and extremity edema.   Expected Outcomes Improve functional capacity of life;Short term: Daily weights obtained and reported for increase. Utilizing diuretic protocols set by physician.;Long term: Adoption of self-care skills and reduction of barriers for early signs and symptoms recognition and intervention leading to self-care maintenance.;Short term: Attendance in program 2-3 days a week with increased exercise capacity. Reported lower sodium intake. Reported increased fruit and vegetable intake. Reports medication compliance.       Personal Goals Discharge:     Goals and Risk Factor Review    Row Name 10/25/15 1354 11/06/15 1708 12/23/15 1709 02/14/16 1000 02/17/16 1212     Core Components/Risk Factors/Patient Goals Review   Personal Goals Review (P)  Sedentary Other Other Other;Increase Strength and Stamina Other;Increase Strength and Stamina   Review (P)  Participant is off to a good start with exercise. Increasing workloads as tolerated. Reviewed goals and home exercise program.  Pt states he feels stronger and his breathing has improved w/ exertion Pt states he feels he's able to walk better and he does 2-3 10 minute bouts of walking everday.   Pt continues to walk 10 minutes, 2x's/ day, 3days/week.  Encouraged to increase duration. Pt feels his heart is getting stronger and that his strength and function has improved. Pt states he "feels good". 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 Pt states that he  feels his heart is stronger because he "feels good" and he's improved his functional ability   Expected Outcomes  - Continue with exercise routine and increase workload as tolerated.  Continue with exercise routine and increase workload as tolerated.  Increase workloads as tolerated, increase duration of home exercise program as tolerated. Increase workloads as tolerated, increase duration of home exercise program as tolerated.   Leonard [name] Name 03/12/16 1453             Core Components/Risk Factors/Patient Goals Review   Personal Goals Review Weight Management/Obesity       Review Pt has gained 4.4 lb.        Expected Outcomes Continue Heart Healthy diet and lifestyle to promote desired wt loss.           Nutrition & Weight - Outcomes:     Pre Biometrics - 10/10/15 1449      Pre Biometrics   Waist Circumference 53.5 inches   Hip Circumference 58 inches   Waist to Hip Ratio 0.92 %   Triceps Skinfold 38 mm   % Body Fat 42.9 %   Grip Strength 33 kg   Flexibility 0 in   Single Leg Stand 0 seconds         Post Biometrics - 03/09/16 1425       Post  Biometrics   Height 5\' 10"  (1.778 m)   Weight (!)  301 lb 13 oz (136.9 kg)   Waist Circumference 48.5 inches   Hip Circumference 53 inches   Waist to Hip Ratio 0.92 %   BMI (Calculated) 43.4      Nutrition:     Nutrition Therapy & Goals - 10/11/15 1357      Nutrition Therapy   Diet Therapeutic Lifestyle Changes     Personal Nutrition Goals   Nutrition Goal 1-2 lb wt loss/week to a wt loss goal of 6-24 lb at graduation from [place], educate and counsel regarding individualized specific dietary modifications aiming towards  targeted core components such as weight, hypertension, lipid management, diabetes, heart failure and other comorbidities.   Expected Outcomes Short Term Goal: Understand basic principles of dietary content, such as calories, fat, sodium, cholesterol and nutrients.;Long Term Goal: Adherence to prescribed nutrition plan.      Nutrition Discharge:     Nutrition Assessments - 03/12/16 1450      MEDFICTS Scores   Pre Score 18   Post Score 24   Score Difference 6      Education Questionnaire Score:     Knowledge Questionnaire Score - 03/09/16 1222      Knowledge Questionnaire Score   Post Score 22/24      Goals reviewed with patient; copy given to patient.Leonard [name]  graduated from cardiac rehab program today with completion of 35 exercise sessions in Phase II. Leonard [name]'s attendance was fair. Leonard [name] took a few weeks off from cardiac rehab to grieve for his daughter during the holidays. Leonard [name]'s daughter passed away in November 28, 2022 for that reason, Leonard [name] cardiac rehab sessions were extended until 03/06/2016.  Medication list reconciled. Repeat  PHQ score- 0 .  Pt has made significant lifestyle changes and should be commended for his success. Pt feels he has achieved his goals during cardiac rehab.   Pt plans to continue exercise in cardiac maintenance program after receiving physical therapy for his back. Leonard [name] enjoyed participating in phase 2 cardiac rehab. We are glad we were able to assist  Leonard [name] during a difficult time in his life.Barnet Pall, RN,BSN 03/16/2016 4:46 PM

## 2016-03-10 DIAGNOSIS — H2513 Age-related nuclear cataract, bilateral: Secondary | ICD-10-CM | POA: Diagnosis not present

## 2016-03-10 DIAGNOSIS — H52203 Unspecified astigmatism, bilateral: Secondary | ICD-10-CM | POA: Diagnosis not present

## 2016-03-10 DIAGNOSIS — H35372 Puckering of macula, left eye: Secondary | ICD-10-CM | POA: Diagnosis not present

## 2016-03-12 ENCOUNTER — Encounter: Payer: Medicare Other | Admitting: Nurse Practitioner

## 2016-03-13 ENCOUNTER — Encounter: Payer: Self-pay | Admitting: Cardiology

## 2016-03-19 ENCOUNTER — Ambulatory Visit (INDEPENDENT_AMBULATORY_CARE_PROVIDER_SITE_OTHER): Payer: Medicare Other | Admitting: Cardiology

## 2016-03-19 ENCOUNTER — Encounter: Payer: Self-pay | Admitting: Cardiology

## 2016-03-19 VITALS — BP 158/96 | HR 84 | Ht 73.0 in | Wt 304.4 lb

## 2016-03-19 DIAGNOSIS — I10 Essential (primary) hypertension: Secondary | ICD-10-CM

## 2016-03-19 DIAGNOSIS — I4901 Ventricular fibrillation: Secondary | ICD-10-CM | POA: Diagnosis not present

## 2016-03-19 DIAGNOSIS — E785 Hyperlipidemia, unspecified: Secondary | ICD-10-CM

## 2016-03-19 DIAGNOSIS — I2129 ST elevation (STEMI) myocardial infarction involving other sites: Secondary | ICD-10-CM

## 2016-03-19 DIAGNOSIS — I5042 Chronic combined systolic (congestive) and diastolic (congestive) heart failure: Secondary | ICD-10-CM

## 2016-03-19 DIAGNOSIS — I255 Ischemic cardiomyopathy: Secondary | ICD-10-CM

## 2016-03-19 DIAGNOSIS — I25119 Atherosclerotic heart disease of native coronary artery with unspecified angina pectoris: Secondary | ICD-10-CM

## 2016-03-19 NOTE — Progress Notes (Signed)
PCP: [name], NP  Clinic Note: Chief Complaint  Patient presents with  . Follow-up    3 months, pt denied chest pain and SOB,   . Coronary Artery Disease  . Cardiomyopathy    HPI: Leonard [name] is a 71 y.o. male with a PMH below who presents today for 3 month follow-up for CAD with inferior STEMI. His MI was complicated by sustained ventricular tachycardia seen during his initial post MI clinic follow-up. This was treated by Dr. Caryl Comes with cardioversion, who then seated with EP study evaluation with ICD placement shortly thereafter on August 4. Widowed cardiac catheterization during that hospitalization indicated that the previously occluded lateral branch was now open  Leonard [name] was last seen on 12/16/2015. He was doing well. In good spirits with exception of being upset after the loss of his daughter and 2017. He was having issues with being motivated. Otherwise he is doing well with current rehabilitation and is just graduated from the program. He indicates interest in going forward with the Cardiac Rehabilitation Maintenance Program.  Recent Hospitalizations: None  Studies Reviewed:   Exercise Tolerance Test 09/06/2015: Normal blood pressure and heart rate response to exercise. No evidence of ischemia. However he only exercised 1:45 min. Rate increased from 82 bpm up to 148 bpm  Interval History: Leonard [name] comes in today feeling great from a cardiac standpoint. He still a little bit down and having hard time motivating himself with depression from his daughter's death, but he is gradually increasing his desires to do other things. He definitely would like to do the maintenance program with cardiac rehabilitation. He has not had any recurrent chest tightness or pressure symptoms to suggest angina. Mild edema but no PND or orthopnea. He didn't get a lot of sleep last night and has not yet taken his blood pressure medications morning indicating that is probably a reason  for his blood pressure being elevated. During cardiac rehabilitation, this is not an issue for him as his blood pressures appear well controlled. Line   Remainder of cardiac review of symptoms: No palpitations, lightheadedness, dizziness, weakness or syncope/near syncope. No TIA/amaurosis fugax symptoms. No melena, hematochezia, hematuria, or epstaxis.  No claudication.  ROS: A comprehensive was performed. Review of Systems  Constitutional: Negative for malaise/fatigue and weight loss.  HENT: Negative for nosebleeds.   Respiratory: Negative for cough, shortness of breath and wheezing.   Gastrointestinal: Negative for blood in stool and constipation.  Musculoskeletal: Negative for back pain.  Neurological: Negative for dizziness.  Endo/Heme/Allergies: Bruises/bleeds easily.  Psychiatric/Behavioral: Positive for depression (Still suffering from feeling almost depressed after the loss of his daughter). Negative for memory loss. The patient is not nervous/anxious and does not have insomnia (Except he did not sleep well last night).   All other systems reviewed and are negative.    Past Medical History:  Diagnosis Date  . Anxiety   . Atherosclerotic heart disease of native coronary artery with angina pectoris ([place])    a. 07/2015 Posterior STEMI/PCI: LM nl, LAd 40ost, RI 40, RCA 181m (3.0x22 Resolute Integrity DES distal, 3.0x12 Resolute Integrity DES prox), 90d (PTCA), RPDA small, nl, RPLB2 100 - too small for PTCA. -- patent in f/u cath 08/2015  . Chronic combined systolic and diastolic CHF (congestive heart failure) ([place])    a. 07/2015 Ehco: EF 45-50%, basal-mid inf and infsept AK, inflat, apical inf, and apical septal HK, Gr1 DD, triv MR.  [name] Hyperlipidemia with target low density lipoprotein (LDL) cholesterol less  than 70 mg/dL   . Hypertensive heart disease   . Ischemic cardiomyopathy    a. 07/2015 Echo: EF 45-50%.; Follow-up Echo 08/23/2015: EF 50-55% with mild inferior hypokinesis.  .  Morbid obesity ([place])   . ST elevation myocardial infarction (STEMI) of inferior wall (Leonard [name]) 08/12/2015   Subacute presentation for inferior MI with post infarction angina on the following day. EKG still shows injury current. Therefore decided to proceed with intervention of 100% mRCA thrombotic lesion.  . Sustained ventricular fibrillation (Leonard) 08/2015   Underwent EP evaluation with final ICD implantation.    Past Surgical History:  Procedure Laterality Date  . CARDIAC CATHETERIZATION N/A 08/12/2015   Procedure: Left Heart Cath and Coronary Angiography;  Surgeon: Leonie Man, MD;  Location: [facility];  Service: Cardiovascular: Inferior STEMI: 100% mid RCA, 90% distal RCA, 100% RPL. 40% ostial and proximal LAD and branch of ramus.  [name] CARDIAC CATHETERIZATION N/A 08/26/2015   Procedure: Left Heart Cath and Coronary Angiography;  Surgeon: Belva Crome, MD;  Location: [facility];  Service: Cardiovascular: To evaluate sustained VT. Widely patent RCA stent and distal PTCA site. Also patent RPL branch. LAD lesion appeared to be more consistent with 50-55% and 40%. Otherwise stable from previous cath. EF estimated 35-45%.  [name] CARDIOVERSION N/A 08/23/2015   Procedure: CARDIOVERSION;  Surgeon: Deboraha Sprang, MD;  Location: [facility];  Service: Cardiovascular;  Laterality: N/A;  . CORONARY ANGIOPLASTY WITH STENT PLACEMENT  08/12/2015   Mid RCA 100% reduced to 0% with 2 overlapping Resolute DES.  3.0 x 22 mm with a 3.0 x 12 mm prox overlarp (postdilated to 3.4 mm); PTCA of dRCA 90%. (Very dificult,complex case - tortuous, Shepherd's Crook RCA - unable to advance longer stents.  RPL2 noted to have thromboembolic 196% occlusion - unable to reach.  . ELECTROPHYSIOLOGIC STUDY N/A 08/26/2015   Procedure: Electrophysiology Study;  Surgeon: Deboraha Sprang, MD;  Location: [facility];  Service: Cardiovascular;  Laterality: N/A;  . EP IMPLANTABLE DEVICE N/A 08/26/2015   Procedure: ICD Implant;   Surgeon: Deboraha Sprang, MD;  Location: [facility];  Service: Cardiovascular;  Laterality: LEFT:  St Jude ICD, serial number [id number].   [name] HEMORRHOID SURGERY    . SKIN SURGERY    . TRANSTHORACIC ECHOCARDIOGRAM  08/13/2015; 08/23/2015   a. Mild concentric LVH. EF 45-50%. Basal-mid inferior and inferoseptal akinesis with hypokinesis of inferolateral and apical inferior wall. 1 DD. ;; b. Technically difficult study. Definity contrast administered.  Compared to a prior echo in 07/2015, the LVEF is slightly higher at 50-55% with mild inferior hypokinesis.   Diagnostic Cath Diagram : 08/12/2015 - Shepherd's crook RCA ;           Post-Intervention Diagram: Stable in 08/2015 2 overlapping Resolute DES 3.0 mm x 20 mm (distal), 3.0 mm x 12 mm (prox) -- (postdilated to 3.4 mm).                  Echo 08/23/2015: Technically difficult study. Definity contrast administered.  Compared to a prior echo in 07/2015, the LVEF is slightly higher at 50-55% with mild inferior hypokinesis.  Current Meds  Medication Sig  . aspirin EC 81 MG tablet Take 1 tablet (81 mg total) by mouth daily.  [name] atorvastatin (LIPITOR) 80 MG tablet Take 1 tablet (80 mg total) by mouth daily at 6 PM.  . cholecalciferol (VITAMIN D) 1000 units tablet Take 2,000 Units by mouth daily.   [name]  Coenzyme Q10 (COQ10) 200 MG CAPS Take 200 mg by mouth daily.  . irbesartan (AVAPRO) 300 MG tablet Take 1 tablet (300 mg total) by mouth daily.  [name] ketotifen (ZADITOR) 0.025 % ophthalmic solution Place 1 drop into both eyes as needed (for dry eyes).  . magnesium oxide (MAG-OX) 400 MG tablet Take 1 tablet (400 mg total) by mouth 2 (two) times daily.  . metoprolol succinate (TOPROL-XL) 50 MG 24 hr tablet Take 1 tablet (50 mg total) by mouth daily. Take with or immediately following a meal.  . Multiple Vitamin (MULTIVITAMIN) tablet Take 1 tablet by mouth daily.  . nitroGLYCERIN (NITROSTAT) 0.4 MG SL tablet Place 1 tablet (0.4 mg total) under the tongue every 5  (five) minutes as needed for chest pain.  . Omega-3 Fatty Acids (FISH OIL) 1200 MG CPDR Take 1 tablet by mouth daily.  . tadalafil (CIALIS) 20 MG tablet Take 20 mg by mouth daily as needed for erectile dysfunction.  . ticagrelor (BRILINTA) 90 MG TABS tablet Take 1 tablet (90 mg total) by mouth 2 (two) times daily.  [name] venlafaxine (EFFEXOR) 75 MG tablet TAKE 1 TABLET BY MOUTH EVERY DAY    Allergies  Allergen Reactions  . Other Itching    Ragweed causes watery eyes, itching, sneezing  . Sulfa Antibiotics Other (See Comments)    Pt unsure of reaction, mother told him he was allergic     Social History   Social History  . Marital status: Married    Spouse name: N/A  . Number of children: N/A  . Years of education: N/A   Social History Main Topics  . Smoking status: Never Smoker  . Smokeless tobacco: Never Used  . Alcohol use 1.8 - 2.4 oz/week    3 - 4 Shots of liquor per week     Comment: 3-4 glasses of scotch 3-4 days per week  . Drug use: No  . Sexual activity: Not Asked   Other Topics Concern  . None   Social History Narrative   He tells me that his daughter died on Halloween 2015/05/04. She had a diving accident at the age of 61 and been quadriplegic for 25 years. She had been incredibly vigorous girl. He showed me her high school graduation picture.She was as he reported beautiful.  Her name was [name].   He is still quite upset    family history includes Diabetes in his mother; Heart disease in his paternal grandfather and paternal grandmother.  Wt Readings from Last 3 Encounters:  03/19/16 (!) 138.1 kg (304 lb 6.4 oz)  03/09/16 (!) 136.9 kg (301 lb 13 oz)  02/27/16 (!) 136.6 kg (301 lb 3.2 oz)    PHYSICAL EXAM BP (!) 158/96   Pulse 84   Ht 6\' 1"  (1.854 m)   Wt (!) 138.1 kg (304 lb 6.4 oz)   BMI 40.16 kg/m  General appearance: alert, cooperative, appears stated age, no distress and Moderate to severely obese. Well groomed. Pleasant mood and affect.  HEENT: [place]/AT,  EOMI, MMM, anicteric sclera Neck: no adenopathy, no carotid bruit and no JVD Lungs: clear to auscultation bilaterally, normal percussion bilaterally and non-labored Heart: regular rate and rhythm, S1 and2 normal, no murmur, click, rub or gallop; unable to palpate PMI Abdomen: soft, non-tender; bowel sounds normal; no masses,  (organomegaly; no HJR. Extremities: extremities normal, atraumatic, no cyanosis, andtrivial edema Pulses: 2+ and symmetric;  Skin: Warm, dry and intact.  Neurologic: Mental status: Alert, oriented, thought content appropriate  Adult ECG Report  Not checked   Other studies Reviewed: Additional studies/ records that were reviewed today include:  Recent Labs:   Lab Results  Component Value Date   CHOL 163 08/12/2015   HDL 41 08/12/2015   LDLCALC 98 08/12/2015   TRIG 122 08/12/2015   CHOLHDL 4.0 08/12/2015    ASSESSMENT / PLAN: Problem List Items Addressed This Visit    Chronic combined systolic and diastolic CHF (congestive heart failure) (HCC) (Chronic)    No active heart failure symptoms. Clearly has diastolic dysfunction by echo. However not requiring any diuretic. On stable dose of ARB and beta blocker. Need to closely monitor blood pressure.      Relevant Orders   Lipid panel   Comprehensive metabolic panel   AMB referral to cardiac rehabilitation   Coronary artery disease involving native coronary artery with angina pectoris ([place]) - Primary (Chronic)    Status was very difficult to compress PCI to his RCA based on the Shepherd[place] Crook morphology. Was in the setting of a delayed presentation inferior STEMI. Thankfully that the PL 2 branch was patent on follow-up cath. No further anginal symptoms. Remains on aspirin plus Brilinta along with Toprol, irbesartan and high-dose atorvastatin.      Essential hypertension (Chronic)    The over 80 range. Cannot get a good night sleep last night and had yet to take his Avapro today. I suspect this probably  the reason for his blood pressures to be elevated. Would be to monitor. Still have room to increase Toprol dose versus convert to carvedilol.       Relevant Orders   Comprehensive metabolic panel   Hyperlipidemia LDL goal <70 (Chronic)    Due for recheck of lipid panel now. He is on high-dose atorvastatin. Not@goal  peri-infarct.      Relevant Orders   Lipid panel   Comprehensive metabolic panel   Ischemic cardiomyopathy (Chronic)    Follow-up echo showed notable improvement in his EF. Lack of heart failure symptoms was suggested the echo is probably more accurate then LV gram at that time. He is on stable dose of ARB and beta blocker. Not requiring diuretic.      Relevant Orders   Comprehensive metabolic panel   AMB referral to cardiac rehabilitation   Morbid obesity ([place]) (Chronic)    Remains significantly obese with BMI of 40. He is doing cardiac rehabilitation, and hopefully as his depression resolves he will get back into some more aggressive exercise [place] outside of cardiac rehabilitation. The next step will be to adjust his diet. Hopefully he we'll be able to take advantage of cardiac rehabilitation dietary education.      ST elevation myocardial infarction (STEMI) of true posterior wall, subsequent episode of care [facility]) (Chronic)    Delayed presentation of inferior MI, acute by ventricular tachycardia. Likely has some scar mediated VT noted on EP study status post ICD now. Revascularize RCA territory with DES stents and no recurrent angina or heart failure symptoms. EF recovered by echo  Refer for [facility] rehabilitation maintenance program      Relevant Orders   AMB referral to cardiac rehabilitation   Sustained ventricular fibrillation (HCC) (Chronic)    Sustained VT that he was actually able to walk in the clinic with. Likely scar mediated as indicated by EP study. Now status post ICD placement. On moderate Toprol dose. No suggestion of recurrent episodes.          Current medicines are reviewed at length with the patient  today. (+/- concerns) None The following changes have been made: None  Patient Instructions  NO CHANGE WITH MEDICATIONS   LABS - DO NOT EAT OR DRINK THE MORNING OF THE TEST   Your physician wants you to follow-up in 6 MONTHS WITH Dr Ellyn Hack.You will receive a reminder letter in the mail two months in advance. If you don't receive a letter, please call our office to schedule the follow-up appointment.   If you need a refill on your cardiac medications before your next appointment, please call your pharmacy.   Studies Ordered:   Orders Placed This Encounter  Procedures  . Lipid panel  . Comprehensive metabolic panel  . AMB referral to cardiac rehabilitation      Glenetta Hew, M.D., M.S. Interventional Cardiologist   Pager # [phone] Phone # [phone] [address] [place], [place] [address]

## 2016-03-19 NOTE — Patient Instructions (Addendum)
NO CHANGE WITH MEDICATIONS   LABS - DO NOT EAT OR DRINK THE MORNING OF THE TEST   Your physician wants you to follow-up in 6 MONTHS WITH Dr Ellyn Hack.You will receive a reminder letter in the mail two months in advance. If you don't receive a letter, please call our office to schedule the follow-up appointment.   If you need a refill on your cardiac medications before your next appointment, please call your pharmacy.

## 2016-03-20 DIAGNOSIS — M546 Pain in thoracic spine: Secondary | ICD-10-CM | POA: Diagnosis not present

## 2016-03-21 ENCOUNTER — Encounter: Payer: Self-pay | Admitting: Cardiology

## 2016-03-21 NOTE — Assessment & Plan Note (Addendum)
Delayed presentation of inferior MI, acute by ventricular tachycardia. Likely has some scar mediated VT noted on EP study status post ICD now. Revascularize RCA territory with DES stents and no recurrent angina or heart failure symptoms. EF recovered by echo  Refer for [facility] rehabilitation maintenance program

## 2016-03-21 NOTE — Assessment & Plan Note (Signed)
Remains significantly obese with BMI of 40. He is doing cardiac rehabilitation, and hopefully as his depression resolves he will get back into some more aggressive exercise [place] outside of cardiac rehabilitation. The next step will be to adjust his diet. Hopefully he we'll be able to take advantage of cardiac rehabilitation dietary education.

## 2016-03-21 NOTE — Assessment & Plan Note (Addendum)
Sustained VT that he was actually able to walk in the clinic with. Likely scar mediated as indicated by EP study. Now status post ICD placement. On moderate Toprol dose. No suggestion of recurrent episodes.

## 2016-03-21 NOTE — Assessment & Plan Note (Signed)
Follow-up echo showed notable improvement in his EF. Lack of heart failure symptoms was suggested the echo is probably more accurate then LV gram at that time. He is on stable dose of ARB and beta blocker. Not requiring diuretic.

## 2016-03-21 NOTE — Assessment & Plan Note (Signed)
No active heart failure symptoms. Clearly has diastolic dysfunction by echo. However not requiring any diuretic. On stable dose of ARB and beta blocker. Need to closely monitor blood pressure.

## 2016-03-21 NOTE — Assessment & Plan Note (Signed)
The over 80 range. Cannot get a good night sleep last night and had yet to take his Avapro today. I suspect this probably the reason for his blood pressures to be elevated. Would be to monitor. Still have room to increase Toprol dose versus convert to carvedilol.

## 2016-03-21 NOTE — Assessment & Plan Note (Signed)
Due for recheck of lipid panel now. He is on high-dose atorvastatin. Not@goal  peri-infarct.

## 2016-03-21 NOTE — Assessment & Plan Note (Signed)
Status was very difficult to compress PCI to his RCA based on the Shepherd's Crook morphology. Was in the setting of a delayed presentation inferior STEMI. Thankfully that the PL 2 branch was patent on follow-up cath. No further anginal symptoms. Remains on aspirin plus Brilinta along with Toprol, irbesartan and high-dose atorvastatin.

## 2016-03-23 NOTE — Progress Notes (Signed)
Electrophysiology Office Note Date: 03/25/2016  ID:  Leonard [name], DOB [birth year], MRN [id number]  PCP: [name], NP Primary Cardiologist: Ellyn Hack Electrophysiologist: Caryl Comes  CC: Routine ICD follow-up  Leonard [name] is a 71 y.o. male seen today for Dr Caryl Comes.  He presents today for routine electrophysiology followup.  Since last being seen in our clinic, the patient reports doing reasonably well.  He is still grieving the loss of his daughter. Today is her birthday.  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  He denies chest pain, palpitations, dyspnea, PND, orthopnea, nausea, vomiting, dizziness, syncope, edema, weight gain, or early satiety.  He has not had ICD shocks.   Device History: STJ single chamber ICD implanted 2017 for VT History of appropriate therapy: Yes History of AAD therapy: No   Past Medical History:  Diagnosis Date  . Anxiety   . Atherosclerotic heart disease of native coronary artery with angina pectoris ([place])    a. 07/2015 Posterior STEMI/PCI: LM nl, LAd 40ost, RI 40, RCA 176m (3.0x22 Resolute Integrity DES distal, 3.0x12 Resolute Integrity DES prox), 90d (PTCA), RPDA small, nl, RPLB2 100 - too small for PTCA. -- patent in f/u cath 08/2015  . Chronic combined systolic and diastolic CHF (congestive heart failure) ([place])    a. 07/2015 Ehco: EF 45-50%, basal-mid inf and infsept AK, inflat, apical inf, and apical septal HK, Gr1 DD, triv MR.  [name] Hyperlipidemia with target low density lipoprotein (LDL) cholesterol less than 70 mg/dL   . Hypertensive heart disease   . Ischemic cardiomyopathy    a. 07/2015 Echo: EF 45-50%.; Follow-up Echo 08/23/2015: EF 50-55% with mild inferior hypokinesis.  . Morbid obesity ([place])   . ST elevation myocardial infarction (STEMI) of inferior wall ([place]) 08/12/2015   Subacute presentation for inferior MI with post infarction angina on the following day. EKG still shows injury current. Therefore decided to proceed  with intervention of 100% mRCA thrombotic lesion.  . Sustained ventricular fibrillation ([place]) 08/2015   Underwent EP evaluation with final ICD implantation.   Past Surgical History:  Procedure Laterality Date  . CARDIAC CATHETERIZATION N/A 08/12/2015   Procedure: Left Heart Cath and Coronary Angiography;  Surgeon: Leonie Man, MD;  Location: [facility];  Service: Cardiovascular: Inferior STEMI: 100% mid RCA, 90% distal RCA, 100% RPL. 40% ostial and proximal LAD and branch of ramus.  [name] CARDIAC CATHETERIZATION N/A 08/26/2015   Procedure: Left Heart Cath and Coronary Angiography;  Surgeon: Belva Crome, MD;  Location: [facility];  Service: Cardiovascular: To evaluate sustained VT. Widely patent RCA stent and distal PTCA site. Also patent RPL branch. LAD lesion appeared to be more consistent with 50-55% and 40%. Otherwise stable from previous cath. EF estimated 35-45%.  [name] CARDIOVERSION N/A 08/23/2015   Procedure: CARDIOVERSION;  Surgeon: Deboraha Sprang, MD;  Location: [facility];  Service: Cardiovascular;  Laterality: N/A;  . CORONARY ANGIOPLASTY WITH STENT PLACEMENT  08/12/2015   Mid RCA 100% reduced to 0% with 2 overlapping Resolute DES.  3.0 x 22 mm with a 3.0 x 12 mm prox overlarp (postdilated to 3.4 mm); PTCA of dRCA 90%. (Very dificult,complex case - tortuous, [place] RCA - unable to advance longer stents.  RPL2 noted to have thromboembolic 702% occlusion - unable to reach.  . ELECTROPHYSIOLOGIC STUDY N/A 08/26/2015   Procedure: Electrophysiology Study;  Surgeon: Deboraha Sprang, MD;  Location: [facility];  Service: Cardiovascular;  Laterality:  N/A;  . EP IMPLANTABLE DEVICE N/A 08/26/2015   Procedure: ICD Implant;  Surgeon: Deboraha Sprang, MD;  Location: [facility];  Service: Cardiovascular;  Laterality: LEFT:  St Jude ICD, serial number [id number].   [name] HEMORRHOID SURGERY    . SKIN SURGERY    . TRANSTHORACIC ECHOCARDIOGRAM  08/13/2015; 08/23/2015   a. Mild concentric  LVH. EF 45-50%. Basal-mid inferior and inferoseptal akinesis with hypokinesis of inferolateral and apical inferior wall. 1 DD. ;; b. Technically difficult study. Definity contrast administered.  Compared to a prior echo in 07/2015, the LVEF is slightly higher at 50-55% with mild inferior hypokinesis.    Current Outpatient Prescriptions  Medication Sig Dispense Refill  . aspirin EC 81 MG tablet Take 1 tablet (81 mg total) by mouth daily.    [name] atorvastatin (LIPITOR) 80 MG tablet Take 1 tablet (80 mg total) by mouth daily at 6 PM. 30 tablet 12  . cholecalciferol (VITAMIN D) 1000 units tablet Take 2,000 Units by mouth daily.     . Coenzyme Q10 (COQ10) 200 MG CAPS Take 200 mg by mouth daily.    . irbesartan (AVAPRO) 300 MG tablet Take 1 tablet (300 mg total) by mouth daily. 90 tablet 3  . ketotifen (ZADITOR) 0.025 % ophthalmic solution Place 1 drop into both eyes as needed (for dry eyes).    . magnesium oxide (MAG-OX) 400 MG tablet Take 1 tablet (400 mg total) by mouth 2 (two) times daily. 60 tablet 6  . metoprolol succinate (TOPROL-XL) 50 MG 24 hr tablet Take 1 tablet (50 mg total) by mouth daily. Take with or immediately following a meal. 30 tablet 6  . Multiple Vitamin (MULTIVITAMIN) tablet Take 1 tablet by mouth daily.    . nitroGLYCERIN (NITROSTAT) 0.4 MG SL tablet Place 1 tablet (0.4 mg total) under the tongue every 5 (five) minutes as needed for chest pain. 25 tablet 2  . Omega-3 Fatty Acids (FISH OIL) 1200 MG CPDR Take 1 tablet by mouth daily.    . tadalafil (CIALIS) 20 MG tablet Take 20 mg by mouth daily as needed for erectile dysfunction.    . ticagrelor (BRILINTA) 90 MG TABS tablet Take 1 tablet (90 mg total) by mouth 2 (two) times daily. 60 tablet 12  . venlafaxine (EFFEXOR) 75 MG tablet TAKE 1 TABLET BY MOUTH EVERY DAY 30 tablet 1   No current facility-administered medications for this visit.     Allergies:   Other and Sulfa antibiotics   Social History: Social History   Social  History  . Marital status: Married    Spouse name: N/A  . Number of children: N/A  . Years of education: N/A   Occupational History  . Not on file.   Social History Main Topics  . Smoking status: Never Smoker  . Smokeless tobacco: Never Used  . Alcohol use 1.8 - 2.4 oz/week    3 - 4 Shots of liquor per week     Comment: 3-4 glasses of scotch 3-4 days per week  . Drug use: No  . Sexual activity: Not on file   Other Topics Concern  . Not on file   Social History Narrative   He tells me that his daughter died on Halloween May 02, 2015. She had a diving accident at the age of 6 and been quadriplegic for 25 years. She had been incredibly vigorous girl. He showed me her high school graduation picture.She was as he reported beautiful.  Her name was [name].  He is still quite upset    Family History: Family History  Problem Relation Age of Onset  . Diabetes Mother   . Heart disease Paternal Grandmother   . Heart disease Paternal Grandfather     Review of Systems: All other systems reviewed and are otherwise negative except as noted above.   Physical Exam: VS:  BP 136/78   Pulse 99   Ht 6\' 1"  (1.854 m)   Wt (!) 303 lb 8 oz (137.7 kg)   SpO2 97%   BMI 40.04 kg/m  , BMI Body mass index is 40.04 kg/m.  GEN- The patient is obese appearing, alert and oriented x 3 today.   HEENT: normocephalic, atraumatic; sclera clear, conjunctiva pink; hearing intact; oropharynx clear; neck supple  Lungs- Clear to ausculation bilaterally, normal work of breathing.  No wheezes, rales, rhonchi Heart- Regular rate and rhythm  GI- soft, non-tender, non-distended, bowel sounds present  Extremities- no clubbing, cyanosis, or edema  MS- no significant deformity or atrophy Skin- warm and dry, no rash or lesion; ICD pocket well healed Psych- euthymic mood, full affect Neuro- strength and sensation are intact  ICD interrogation- reviewed in detail today,  See PACEART report  EKG:  EKG is not ordered  today.  Recent Labs: 08/22/2015: B Natriuretic Peptide 641.1 10/14/2015: ALT 17 12/06/2015: Magnesium 1.8 02/27/2016: BUN 28; Creat 1.38; Hemoglobin 15.0; Platelets 199; Potassium 4.8; Sodium 133   Wt Readings from Last 3 Encounters:  03/25/16 (!) 303 lb 8 oz (137.7 kg)  03/19/16 (!) 304 lb 6.4 oz (138.1 kg)  03/09/16 (!) 301 lb 13 oz (136.9 kg)     Other studies Reviewed: Additional studies/ records that were reviewed today include: Dr Ellyn Hack and Dr Olin Pia office notes  Assessment and Plan:  1.  Ventricular tachycardia No recent recurrence Normal ICD function See Pace Art report No changes today Continue BB Keep K >3.9, Mg >1.8  2.  ICM/CAD No recent ischemic symptoms Continue current therapy Will send referral to the maintenance portion of cardiac rehab today   3.  Morbid obesity Body mass index is 40.04 kg/m. Weight loss advised   Current medicines are reviewed at length with the patient today.   The patient does not have concerns regarding his medicines.  The following changes were made today:  none  Labs/ tests ordered today include: none Orders Placed This Encounter  Procedures  . CUP PACEART INCLINIC DEVICE CHECK     Disposition:   Follow up with Dr Caryl Comes in 3 months (as per plan in Dr Olin Pia last office note), Dr Ellyn Hack as scheduled    Signed, Chanetta Marshall, NP 03/25/2016 9:14 AM  [address] [place] [address] [phone] (office) [phone] (fax)

## 2016-03-24 DIAGNOSIS — M546 Pain in thoracic spine: Secondary | ICD-10-CM | POA: Diagnosis not present

## 2016-03-25 ENCOUNTER — Ambulatory Visit (INDEPENDENT_AMBULATORY_CARE_PROVIDER_SITE_OTHER): Payer: Medicare Other | Admitting: Nurse Practitioner

## 2016-03-25 ENCOUNTER — Encounter: Payer: Self-pay | Admitting: Nurse Practitioner

## 2016-03-25 VITALS — BP 136/78 | HR 99 | Ht 73.0 in | Wt 303.5 lb

## 2016-03-25 DIAGNOSIS — I255 Ischemic cardiomyopathy: Secondary | ICD-10-CM | POA: Diagnosis not present

## 2016-03-25 DIAGNOSIS — I472 Ventricular tachycardia, unspecified: Secondary | ICD-10-CM

## 2016-03-25 DIAGNOSIS — I2589 Other forms of chronic ischemic heart disease: Secondary | ICD-10-CM

## 2016-03-25 LAB — CUP PACEART INCLINIC DEVICE CHECK
Date Time Interrogation Session: 20180307085839
Implantable Lead Location: 753860
MDC IDC LEAD IMPLANT DT: 20170807
MDC IDC PG IMPLANT DT: 20170807
MDC IDC PG SERIAL: 7377761

## 2016-03-25 NOTE — Patient Instructions (Addendum)
Medication Instructions:   Your physician recommends that you continue on your current medications as directed. Please refer to the Current Medication list given to you today.   If you need a refill on your cardiac medications before your next appointment, please call your pharmacy.  Labwork: NONE ORDERED  TODAY    Testing/Procedures: NONE ORDERED  TODAY    Follow-Up: in 3 months with dr Caryl Comes    Any Other Special Instructions Will Be Listed Below (If Applicable).

## 2016-04-06 DIAGNOSIS — M546 Pain in thoracic spine: Secondary | ICD-10-CM | POA: Diagnosis not present

## 2016-04-15 ENCOUNTER — Other Ambulatory Visit: Payer: Self-pay | Admitting: *Deleted

## 2016-04-15 DIAGNOSIS — I255 Ischemic cardiomyopathy: Secondary | ICD-10-CM

## 2016-04-15 DIAGNOSIS — I10 Essential (primary) hypertension: Secondary | ICD-10-CM

## 2016-04-15 DIAGNOSIS — I2129 ST elevation (STEMI) myocardial infarction involving other sites: Secondary | ICD-10-CM

## 2016-04-27 DIAGNOSIS — M546 Pain in thoracic spine: Secondary | ICD-10-CM | POA: Diagnosis not present

## 2016-04-29 ENCOUNTER — Telehealth: Payer: Self-pay | Admitting: Family Medicine

## 2016-04-29 NOTE — Telephone Encounter (Signed)
Called pt to schedule CPE. He will call back to schedule the appt when he is by he calendar

## 2016-05-04 ENCOUNTER — Other Ambulatory Visit: Payer: Self-pay | Admitting: Family Medicine

## 2016-05-04 ENCOUNTER — Encounter (HOSPITAL_COMMUNITY)
Admission: RE | Admit: 2016-05-04 | Discharge: 2016-05-04 | Disposition: A | Discharge status: Home / Self Care | Payer: Self-pay | Source: Ambulatory Visit | Attending: Cardiology | Admitting: Cardiology

## 2016-05-04 DIAGNOSIS — I255 Ischemic cardiomyopathy: Secondary | ICD-10-CM | POA: Insufficient documentation

## 2016-05-04 DIAGNOSIS — I2129 ST elevation (STEMI) myocardial infarction involving other sites: Secondary | ICD-10-CM | POA: Insufficient documentation

## 2016-05-04 DIAGNOSIS — I10 Essential (primary) hypertension: Secondary | ICD-10-CM | POA: Insufficient documentation

## 2016-05-04 NOTE — Telephone Encounter (Signed)
Is this okay to refill? 

## 2016-05-04 NOTE — Telephone Encounter (Signed)
Ok

## 2016-05-06 ENCOUNTER — Encounter (HOSPITAL_COMMUNITY): Payer: Self-pay

## 2016-05-08 ENCOUNTER — Encounter (HOSPITAL_COMMUNITY)
Admission: RE | Admit: 2016-05-08 | Discharge: 2016-05-08 | Disposition: A | Discharge status: Home / Self Care | Payer: Self-pay | Source: Ambulatory Visit | Attending: Cardiology | Admitting: Cardiology

## 2016-05-11 ENCOUNTER — Encounter (HOSPITAL_COMMUNITY)
Admission: RE | Admit: 2016-05-11 | Discharge: 2016-05-11 | Disposition: A | Discharge status: Home / Self Care | Payer: Self-pay | Source: Ambulatory Visit | Attending: Cardiology | Admitting: Cardiology

## 2016-05-13 ENCOUNTER — Encounter (HOSPITAL_COMMUNITY)
Admission: RE | Admit: 2016-05-13 | Discharge: 2016-05-13 | Disposition: A | Discharge status: Home / Self Care | Payer: Self-pay | Source: Ambulatory Visit | Attending: Cardiology | Admitting: Cardiology

## 2016-05-15 ENCOUNTER — Encounter (HOSPITAL_COMMUNITY): Payer: Self-pay

## 2016-05-17 ENCOUNTER — Other Ambulatory Visit: Payer: Self-pay | Admitting: Internal Medicine

## 2016-05-18 ENCOUNTER — Other Ambulatory Visit: Payer: Self-pay | Admitting: Internal Medicine

## 2016-05-18 ENCOUNTER — Encounter (HOSPITAL_COMMUNITY)
Admission: RE | Admit: 2016-05-18 | Discharge: 2016-05-18 | Disposition: A | Discharge status: Home / Self Care | Payer: Self-pay | Source: Ambulatory Visit | Attending: Cardiology | Admitting: Cardiology

## 2016-05-18 NOTE — Telephone Encounter (Signed)
Dr. Allison Quarry pt

## 2016-05-18 NOTE — Telephone Encounter (Signed)
REFILL 

## 2016-05-20 ENCOUNTER — Encounter (HOSPITAL_COMMUNITY): Payer: Medicare Other

## 2016-05-20 DIAGNOSIS — I2129 ST elevation (STEMI) myocardial infarction involving other sites: Secondary | ICD-10-CM | POA: Insufficient documentation

## 2016-05-20 DIAGNOSIS — I255 Ischemic cardiomyopathy: Secondary | ICD-10-CM | POA: Insufficient documentation

## 2016-05-20 DIAGNOSIS — I10 Essential (primary) hypertension: Secondary | ICD-10-CM | POA: Insufficient documentation

## 2016-05-22 ENCOUNTER — Encounter (HOSPITAL_COMMUNITY)
Admission: RE | Admit: 2016-05-22 | Discharge: 2016-05-22 | Disposition: A | Discharge status: Home / Self Care | Payer: Self-pay | Source: Ambulatory Visit | Attending: Cardiology | Admitting: Cardiology

## 2016-05-25 ENCOUNTER — Encounter (HOSPITAL_COMMUNITY): Payer: Medicare Other

## 2016-05-27 ENCOUNTER — Encounter (HOSPITAL_COMMUNITY)
Admission: RE | Admit: 2016-05-27 | Discharge: 2016-05-27 | Disposition: A | Discharge status: Home / Self Care | Payer: Medicare Other | Source: Ambulatory Visit | Attending: Cardiology | Admitting: Cardiology

## 2016-05-29 ENCOUNTER — Encounter (HOSPITAL_COMMUNITY)
Admission: RE | Admit: 2016-05-29 | Discharge: 2016-05-29 | Disposition: A | Discharge status: Home / Self Care | Payer: Self-pay | Source: Ambulatory Visit | Attending: Cardiology | Admitting: Cardiology

## 2016-06-01 ENCOUNTER — Encounter (HOSPITAL_COMMUNITY): Payer: Self-pay

## 2016-06-03 ENCOUNTER — Encounter (HOSPITAL_COMMUNITY): Payer: Self-pay

## 2016-06-05 ENCOUNTER — Encounter (HOSPITAL_COMMUNITY)
Admission: RE | Admit: 2016-06-05 | Discharge: 2016-06-05 | Disposition: A | Discharge status: Home / Self Care | Payer: Self-pay | Source: Ambulatory Visit | Attending: Cardiology | Admitting: Cardiology

## 2016-06-08 ENCOUNTER — Encounter (HOSPITAL_COMMUNITY): Payer: Self-pay

## 2016-06-09 ENCOUNTER — Observation Stay (HOSPITAL_BASED_OUTPATIENT_CLINIC_OR_DEPARTMENT_OTHER): Payer: Medicare Other

## 2016-06-09 ENCOUNTER — Encounter (HOSPITAL_COMMUNITY): Payer: Self-pay | Admitting: Emergency Medicine

## 2016-06-09 ENCOUNTER — Emergency Department (HOSPITAL_COMMUNITY): Payer: Medicare Other

## 2016-06-09 ENCOUNTER — Inpatient Hospital Stay (HOSPITAL_COMMUNITY)
Admission: EM | Admit: 2016-06-09 | Discharge: 2016-06-11 | DRG: 286 | Disposition: A | Discharge status: Home / Self Care | Payer: Medicare Other | Attending: Internal Medicine | Admitting: Internal Medicine

## 2016-06-09 ENCOUNTER — Other Ambulatory Visit: Payer: Self-pay

## 2016-06-09 DIAGNOSIS — Z7982 Long term (current) use of aspirin: Secondary | ICD-10-CM

## 2016-06-09 DIAGNOSIS — Z79899 Other long term (current) drug therapy: Secondary | ICD-10-CM

## 2016-06-09 DIAGNOSIS — N179 Acute kidney failure, unspecified: Secondary | ICD-10-CM | POA: Diagnosis not present

## 2016-06-09 DIAGNOSIS — Z8679 Personal history of other diseases of the circulatory system: Secondary | ICD-10-CM | POA: Diagnosis not present

## 2016-06-09 DIAGNOSIS — E785 Hyperlipidemia, unspecified: Secondary | ICD-10-CM | POA: Diagnosis present

## 2016-06-09 DIAGNOSIS — I361 Nonrheumatic tricuspid (valve) insufficiency: Secondary | ICD-10-CM | POA: Diagnosis not present

## 2016-06-09 DIAGNOSIS — I10 Essential (primary) hypertension: Secondary | ICD-10-CM | POA: Diagnosis present

## 2016-06-09 DIAGNOSIS — I130 Hypertensive heart and chronic kidney disease with heart failure and stage 1 through stage 4 chronic kidney disease, or unspecified chronic kidney disease: Principal | ICD-10-CM | POA: Diagnosis present

## 2016-06-09 DIAGNOSIS — I2510 Atherosclerotic heart disease of native coronary artery without angina pectoris: Secondary | ICD-10-CM | POA: Diagnosis not present

## 2016-06-09 DIAGNOSIS — I5043 Acute on chronic combined systolic (congestive) and diastolic (congestive) heart failure: Secondary | ICD-10-CM | POA: Diagnosis present

## 2016-06-09 DIAGNOSIS — Z95810 Presence of automatic (implantable) cardiac defibrillator: Secondary | ICD-10-CM

## 2016-06-09 DIAGNOSIS — I420 Dilated cardiomyopathy: Secondary | ICD-10-CM | POA: Diagnosis present

## 2016-06-09 DIAGNOSIS — Z882 Allergy status to sulfonamides status: Secondary | ICD-10-CM

## 2016-06-09 DIAGNOSIS — Z6838 Body mass index (BMI) 38.0-38.9, adult: Secondary | ICD-10-CM

## 2016-06-09 DIAGNOSIS — I272 Pulmonary hypertension, unspecified: Secondary | ICD-10-CM | POA: Diagnosis present

## 2016-06-09 DIAGNOSIS — I255 Ischemic cardiomyopathy: Secondary | ICD-10-CM | POA: Diagnosis present

## 2016-06-09 DIAGNOSIS — R0602 Shortness of breath: Secondary | ICD-10-CM | POA: Diagnosis not present

## 2016-06-09 DIAGNOSIS — I5041 Acute combined systolic (congestive) and diastolic (congestive) heart failure: Secondary | ICD-10-CM | POA: Diagnosis not present

## 2016-06-09 DIAGNOSIS — I5040 Unspecified combined systolic (congestive) and diastolic (congestive) heart failure: Secondary | ICD-10-CM | POA: Diagnosis present

## 2016-06-09 DIAGNOSIS — I252 Old myocardial infarction: Secondary | ICD-10-CM

## 2016-06-09 DIAGNOSIS — I110 Hypertensive heart disease with heart failure: Secondary | ICD-10-CM | POA: Diagnosis not present

## 2016-06-09 DIAGNOSIS — Z955 Presence of coronary angioplasty implant and graft: Secondary | ICD-10-CM

## 2016-06-09 DIAGNOSIS — F419 Anxiety disorder, unspecified: Secondary | ICD-10-CM | POA: Diagnosis present

## 2016-06-09 HISTORY — DX: Presence of automatic (implantable) cardiac defibrillator: Z95.810

## 2016-06-09 HISTORY — PX: TRANSTHORACIC ECHOCARDIOGRAM: SHX275

## 2016-06-09 LAB — CBC
HCT: 47.9 % (ref 39.0–52.0)
Hemoglobin: 15.5 g/dL (ref 13.0–17.0)
MCH: 33 pg (ref 26.0–34.0)
MCHC: 32.4 g/dL (ref 30.0–36.0)
MCV: 101.9 fL — AB (ref 78.0–100.0)
PLATELETS: 253 10*3/uL (ref 150–400)
RBC: 4.7 MIL/uL (ref 4.22–5.81)
RDW: 14.2 % (ref 11.5–15.5)
WBC: 19.2 10*3/uL — ABNORMAL HIGH (ref 4.0–10.5)

## 2016-06-09 LAB — BASIC METABOLIC PANEL
Anion gap: 11 (ref 5–15)
BUN: 20 mg/dL (ref 6–20)
CALCIUM: 9.2 mg/dL (ref 8.9–10.3)
CHLORIDE: 103 mmol/L (ref 101–111)
CO2: 23 mmol/L (ref 22–32)
CREATININE: 1.37 mg/dL — AB (ref 0.61–1.24)
GFR calc non Af Amer: 50 mL/min — ABNORMAL LOW (ref >60)
GFR, EST AFRICAN AMERICAN: 57 mL/min — AB (ref >60)
Glucose, Bld: 155 mg/dL — ABNORMAL HIGH (ref 65–99)
Potassium: 4.5 mmol/L (ref 3.5–5.1)
SODIUM: 137 mmol/L (ref 135–145)

## 2016-06-09 LAB — ECHOCARDIOGRAM COMPLETE
HEIGHTINCHES: 73 in
Weight: 4814.4 oz

## 2016-06-09 LAB — BRAIN NATRIURETIC PEPTIDE: B NATRIURETIC PEPTIDE 5: 853.2 pg/mL — AB (ref 0.0–100.0)

## 2016-06-09 LAB — I-STAT TROPONIN, ED: Troponin i, poc: 0.02 ng/mL (ref 0.00–0.08)

## 2016-06-09 LAB — MRSA PCR SCREENING: MRSA BY PCR: NEGATIVE

## 2016-06-09 MED ORDER — ONDANSETRON HCL 4 MG/2ML IJ SOLN: 4.0000 mg | Freq: Four times a day (QID) | INTRAMUSCULAR | Status: DC | PRN

## 2016-06-09 MED ORDER — PERFLUTREN LIPID MICROSPHERE
INTRAVENOUS | Status: AC
  Administered 2016-06-09: 2 mL
  Filled 2016-06-09: qty 10

## 2016-06-09 MED ORDER — NITROGLYCERIN 0.4 MG SL SUBL: 0.4000 mg | SUBLINGUAL_TABLET | SUBLINGUAL | Status: DC | PRN

## 2016-06-09 MED ORDER — SODIUM CHLORIDE 0.9% FLUSH
3.0000 mL | Freq: Two times a day (BID) | INTRAVENOUS | Status: DC
  Administered 2016-06-09 – 2016-06-10 (×3): 3 mL via INTRAVENOUS

## 2016-06-09 MED ORDER — MAGNESIUM OXIDE 400 MG PO TABS: 400.0000 mg | ORAL_TABLET | Freq: Two times a day (BID) | ORAL | Status: DC

## 2016-06-09 MED ORDER — NITROGLYCERIN 2 % TD OINT: 1.0000 [in_us] | TOPICAL_OINTMENT | Freq: Once | TRANSDERMAL | Status: DC

## 2016-06-09 MED ORDER — SODIUM CHLORIDE 0.9 % IV SOLN: 250.0000 mL | INTRAVENOUS | Status: DC | PRN

## 2016-06-09 MED ORDER — MAGNESIUM OXIDE 400 (241.3 MG) MG PO TABS
400.0000 mg | ORAL_TABLET | Freq: Two times a day (BID) | ORAL | Status: DC
  Administered 2016-06-09 – 2016-06-11 (×5): 400 mg via ORAL
  Filled 2016-06-09 (×5): qty 1

## 2016-06-09 MED ORDER — ATORVASTATIN CALCIUM 80 MG PO TABS
80.0000 mg | ORAL_TABLET | Freq: Every day | ORAL | Status: DC
  Administered 2016-06-09 – 2016-06-10 (×2): 80 mg via ORAL
  Filled 2016-06-09 (×2): qty 1

## 2016-06-09 MED ORDER — VITAMIN D 1000 UNITS PO TABS
2000.0000 [IU] | ORAL_TABLET | Freq: Every day | ORAL | Status: DC
  Administered 2016-06-09 – 2016-06-11 (×3): 2000 [IU] via ORAL
  Filled 2016-06-09 (×3): qty 2

## 2016-06-09 MED ORDER — COQ10 200 MG PO CAPS: 200.0000 mg | ORAL_CAPSULE | Freq: Every day | ORAL | Status: DC

## 2016-06-09 MED ORDER — OMEGA-3-ACID ETHYL ESTERS 1 G PO CAPS
1.0000 g | ORAL_CAPSULE | Freq: Two times a day (BID) | ORAL | Status: DC
  Administered 2016-06-09 – 2016-06-11 (×5): 1 g via ORAL
  Filled 2016-06-09 (×5): qty 1

## 2016-06-09 MED ORDER — IRBESARTAN 300 MG PO TABS
300.0000 mg | ORAL_TABLET | Freq: Every day | ORAL | Status: DC
  Administered 2016-06-09 – 2016-06-11 (×3): 300 mg via ORAL
  Filled 2016-06-09 (×3): qty 1

## 2016-06-09 MED ORDER — TICAGRELOR 90 MG PO TABS
90.0000 mg | ORAL_TABLET | Freq: Two times a day (BID) | ORAL | Status: DC
  Administered 2016-06-09 – 2016-06-11 (×5): 90 mg via ORAL
  Filled 2016-06-09 (×5): qty 1

## 2016-06-09 MED ORDER — FUROSEMIDE 10 MG/ML IJ SOLN
40.0000 mg | Freq: Once | INTRAMUSCULAR | Status: AC
  Administered 2016-06-09: 40 mg via INTRAVENOUS
  Filled 2016-06-09: qty 4

## 2016-06-09 MED ORDER — ACETAMINOPHEN 325 MG PO TABS: 650.0000 mg | ORAL_TABLET | ORAL | Status: DC | PRN

## 2016-06-09 MED ORDER — FUROSEMIDE 10 MG/ML IJ SOLN
40.0000 mg | Freq: Two times a day (BID) | INTRAMUSCULAR | Status: AC
  Administered 2016-06-09 – 2016-06-10 (×2): 40 mg via INTRAVENOUS
  Filled 2016-06-09 (×2): qty 4

## 2016-06-09 MED ORDER — VENLAFAXINE HCL 75 MG PO TABS
75.0000 mg | ORAL_TABLET | Freq: Every day | ORAL | Status: DC
  Administered 2016-06-09 – 2016-06-11 (×3): 75 mg via ORAL
  Filled 2016-06-09 (×3): qty 1

## 2016-06-09 MED ORDER — ADULT MULTIVITAMIN W/MINERALS CH
1.0000 | ORAL_TABLET | Freq: Every day | ORAL | Status: DC
  Administered 2016-06-09 – 2016-06-11 (×3): 1 via ORAL
  Filled 2016-06-09 (×3): qty 1

## 2016-06-09 MED ORDER — ONE-DAILY MULTI VITAMINS PO TABS: 1.0000 | ORAL_TABLET | Freq: Every day | ORAL | Status: DC

## 2016-06-09 MED ORDER — SODIUM CHLORIDE 0.9% FLUSH: 3.0000 mL | INTRAVENOUS | Status: DC | PRN

## 2016-06-09 MED ORDER — ASPIRIN 300 MG RE SUPP: 300.0000 mg | RECTAL | Status: AC

## 2016-06-09 MED ORDER — PERFLUTREN LIPID MICROSPHERE
1.0000 mL | INTRAVENOUS | Status: AC | PRN
  Filled 2016-06-09: qty 10

## 2016-06-09 MED ORDER — ASPIRIN EC 81 MG PO TBEC
81.0000 mg | DELAYED_RELEASE_TABLET | Freq: Every day | ORAL | Status: DC
  Administered 2016-06-09 – 2016-06-11 (×3): 81 mg via ORAL
  Filled 2016-06-09 (×3): qty 1

## 2016-06-09 MED ORDER — FISH OIL 1200 MG PO CPDR: 1.0000 | DELAYED_RELEASE_CAPSULE | Freq: Every day | ORAL | Status: DC

## 2016-06-09 MED ORDER — METOPROLOL SUCCINATE ER 25 MG PO TB24
25.0000 mg | ORAL_TABLET | Freq: Every day | ORAL | Status: DC
Start: 2016-06-09 — End: 2016-06-11
  Administered 2016-06-09 – 2016-06-11 (×3): 25 mg via ORAL
  Filled 2016-06-09 (×3): qty 1

## 2016-06-09 MED ORDER — ASPIRIN 81 MG PO CHEW
324.0000 mg | CHEWABLE_TABLET | ORAL | Status: AC
  Administered 2016-06-09: 324 mg via ORAL
  Filled 2016-06-09: qty 4

## 2016-06-09 NOTE — ED Provider Notes (Signed)
[facility] DEPT Provider Note   CSN: [id number] Arrival date & time: 06/09/16  0556     History   Chief Complaint Chief Complaint  Patient presents with  . Shortness of Breath    HPI Sherlock Nancarrow III is a 71 y.o. male.  The history is provided by the patient and the spouse.  Shortness of Breath  This is a new problem. The problem occurs frequently.The current episode started yesterday. The problem has been gradually worsening. Associated symptoms include orthopnea. Pertinent negatives include no fever, no chest pain, no vomiting and no leg swelling. He has tried nothing for the symptoms. Associated medical issues include CAD.  Patient with h/o CAD/CHF, h/o Vfib with ICD in place presents with progressive SOB over past 24 hours He reports orthopnea and some dyspnea on exertion No CP/back pain No fever/vomiting No new LE edema  Pt with ICD in place but no recent shocks  Past Medical History:  Diagnosis Date  . Anxiety   . Atherosclerotic heart disease of native coronary artery with angina pectoris ([place])    a. 07/2015 Posterior STEMI/PCI: LM nl, LAd 40ost, RI 40, RCA 145m (3.0x22 Resolute Integrity DES distal, 3.0x12 Resolute Integrity DES prox), 90d (PTCA), RPDA small, nl, RPLB2 100 - too small for PTCA. -- patent in f/u cath 08/2015  . Chronic combined systolic and diastolic CHF (congestive heart failure) ([place])    a. 07/2015 Ehco: EF 45-50%, basal-mid inf and infsept AK, inflat, apical inf, and apical septal HK, Gr1 DD, triv MR.  [name] Hyperlipidemia with target low density lipoprotein (LDL) cholesterol less than 70 mg/dL   . Hypertensive heart disease   . Ischemic cardiomyopathy    a. 07/2015 Echo: EF 45-50%.; Follow-up Echo 08/23/2015: EF 50-55% with mild inferior hypokinesis.  . Morbid obesity ([place])   . ST elevation myocardial infarction (STEMI) of inferior wall ([place]) 08/12/2015   Subacute presentation for inferior MI with post infarction angina on the following day. EKG  still shows injury current. Therefore decided to proceed with intervention of 100% mRCA thrombotic lesion.  . Sustained ventricular fibrillation ([place]) 08/2015   Underwent EP evaluation with final ICD implantation.    Patient Active Problem List   Diagnosis Date Noted  . Grief 02/27/2016  . Ischemic cardiomyopathy   . Morbid obesity ([place])   . Chronic combined systolic and diastolic CHF (congestive heart failure) ([place])   . Sustained ventricular fibrillation ([place]) 08/20/2015  . Elevated glucose 08/14/2015  . ST elevation myocardial infarction (STEMI) of true posterior wall, subsequent episode of care ([place]) 08/12/2015  . Coronary artery disease involving native coronary artery with angina pectoris ([place]) 08/12/2015  . Essential hypertension 02/12/2015  . Hyperlipidemia LDL goal <70 02/12/2015  . Erectile dysfunction 02/12/2015  . BPH (benign prostatic hyperplasia) 02/12/2015    Past Surgical History:  Procedure Laterality Date  . CARDIAC CATHETERIZATION N/A 08/12/2015   Procedure: Left Heart Cath and Coronary Angiography;  Surgeon: Leonie Man, MD;  Location: [facility];  Service: Cardiovascular: Inferior STEMI: 100% mid RCA, 90% distal RCA, 100% RPL. 40% ostial and proximal LAD and branch of ramus.  [name] CARDIAC CATHETERIZATION N/A 08/26/2015   Procedure: Left Heart Cath and Coronary Angiography;  Surgeon: Belva Crome, MD;  Location: [facility];  Service: Cardiovascular: To evaluate sustained VT. Widely patent RCA stent and distal PTCA site. Also patent RPL branch. LAD lesion appeared to be more consistent with 50-55% and 40%. Otherwise stable from previous cath. EF estimated 35-45%.  [name]  CARDIOVERSION N/A 08/23/2015   Procedure: CARDIOVERSION;  Surgeon: Deboraha Sprang, MD;  Location: [facility];  Service: Cardiovascular;  Laterality: N/A;  . CORONARY ANGIOPLASTY WITH STENT PLACEMENT  08/12/2015   Mid RCA 100% reduced to 0% with 2 overlapping Resolute DES.  3.0 x 22 mm with a 3.0 x  12 mm prox overlarp (postdilated to 3.4 mm); PTCA of dRCA 90%. (Very dificult,complex case - tortuous, [place] RCA - unable to advance longer stents.  RPL2 noted to have thromboembolic 951% occlusion - unable to reach.  . ELECTROPHYSIOLOGIC STUDY N/A 08/26/2015   Procedure: Electrophysiology Study;  Surgeon: Deboraha Sprang, MD;  Location: [facility];  Service: Cardiovascular;  Laterality: N/A;  . EP IMPLANTABLE DEVICE N/A 08/26/2015   Procedure: ICD Implant;  Surgeon: Deboraha Sprang, MD;  Location: [facility];  Service: Cardiovascular;  Laterality: LEFT:  St Jude ICD, serial number [id number].   [name] HEMORRHOID SURGERY    . SKIN SURGERY    . TRANSTHORACIC ECHOCARDIOGRAM  08/13/2015; 08/23/2015   a. Mild concentric LVH. EF 45-50%. Basal-mid inferior and inferoseptal akinesis with hypokinesis of inferolateral and apical inferior wall. 1 DD. ;; b. Technically difficult study. Definity contrast administered.  Compared to a prior echo in 07/2015, the LVEF is slightly higher at 50-55% with mild inferior hypokinesis.       Home Medications    Prior to Admission medications   Medication Sig Start Date End Date Taking? Authorizing Provider  aspirin EC 81 MG tablet Take 1 tablet (81 mg total) by mouth daily. 08/14/15   Arbutus Leas, NP  atorvastatin (LIPITOR) 80 MG tablet Take 1 tablet (80 mg total) by mouth daily at 6 PM. 08/14/15   Arbutus Leas, NP  cholecalciferol (VITAMIN D) 1000 units tablet Take 2,000 Units by mouth daily.     [provider]  Coenzyme Q10 (COQ10) 200 MG CAPS Take 200 mg by mouth daily.    [provider]  irbesartan (AVAPRO) 300 MG tablet Take 1 tablet (300 mg total) by mouth daily. 12/16/15   Leonie Man, MD  ketotifen (ZADITOR) 0.025 % ophthalmic solution Place 1 drop into both eyes as needed (for dry eyes).    [provider]  magnesium oxide (MAG-OX) 400 MG tablet Take 1 tablet (400 mg total) by mouth 2 (two) times daily. 12/16/15    Leonie Man, MD  metoprolol succinate (TOPROL-XL) 50 MG 24 hr tablet TAKE 1 TABLET BY MOUTH DAILY WITH OR IMMEDIATELY FOLLOWING A MEAL 05/19/16   Leonie Man, MD  Multiple Vitamin (MULTIVITAMIN) tablet Take 1 tablet by mouth daily.    [provider]  nitroGLYCERIN (NITROSTAT) 0.4 MG SL tablet Place 1 tablet (0.4 mg total) under the tongue every 5 (five) minutes as needed for chest pain. 08/14/15   Arbutus Leas, NP  Omega-3 Fatty Acids (FISH OIL) 1200 MG CPDR Take 1 tablet by mouth daily.    [provider]  tadalafil (CIALIS) 20 MG tablet Take 20 mg by mouth daily as needed for erectile dysfunction.    [provider]  ticagrelor (BRILINTA) 90 MG TABS tablet Take 1 tablet (90 mg total) by mouth 2 (two) times daily. 08/14/15   Arbutus Leas, NP  venlafaxine (EFFEXOR) 75 MG tablet TAKE 1 TABLET BY MOUTH EVERY DAY 05/04/16   Girtha Rm, NP-C    Family History Family History  Problem Relation Age of Onset  . Diabetes Mother   .  Heart disease Paternal Grandmother   . Heart disease Paternal Grandfather     Social History Social History  Substance Use Topics  . Smoking status: Never Smoker  . Smokeless tobacco: Never Used  . Alcohol use 1.8 - 2.4 oz/week    3 - 4 Shots of liquor per week     Comment: 3-4 glasses of scotch 3-4 days per week     Allergies   Other and Sulfa antibiotics   Review of Systems Review of Systems  Constitutional: Negative for fever.  Respiratory: Positive for shortness of breath.   Cardiovascular: Positive for orthopnea. Negative for chest pain and leg swelling.  Gastrointestinal: Positive for nausea. Negative for vomiting.  Neurological: Negative for syncope, weakness and numbness.  All other systems reviewed and are negative.    Physical Exam Updated Vital Signs BP (!) 153/95 (BP Location: Right Arm)   Pulse 91   Temp 98.6 F (37 C) (Oral)   Resp (!) 21   Ht 1.867 m (6' 1.5")   Wt 131.5 kg (290 lb)   SpO2  95%   BMI 37.74 kg/m   Physical Exam CONSTITUTIONAL: Well developed/well nourished HEAD: Normocephalic/atraumatic EYES: EOMI/PERRL ENMT: Mucous membranes moist NECK: supple no meningeal signs SPINE/BACK:entire spine nontender CV: S1/S2 noted, no murmurs/rubs/gallops noted LUNGS: tachypnea, decreased BS noted in the bases ABDOMEN: soft, nontender, no rebound or guarding, bowel sounds noted throughout abdomen GU:no cva tenderness NEURO: Pt is awake/alert/appropriate, moves all extremitiesx4.  No facial droop.   EXTREMITIES: pulses normal/equal, full ROM, no LE edema SKIN: warm, color normal PSYCH: no abnormalities of mood noted, alert and oriented to situation   ED Treatments / Results  Labs (all labs ordered are listed, but only abnormal results are displayed) Labs Reviewed  BASIC METABOLIC PANEL - Abnormal; Notable for the following:       Result Value   Glucose, Bld 155 (*)    Creatinine, Ser 1.37 (*)    GFR calc non Af Amer 50 (*)    GFR calc Af Amer 57 (*)    All other components within normal limits  CBC - Abnormal; Notable for the following:    WBC 19.2 (*)    MCV 101.9 (*)    All other components within normal limits  BRAIN NATRIURETIC PEPTIDE - Abnormal; Notable for the following:    B Natriuretic Peptide 853.2 (*)    All other components within normal limits  I-STAT TROPOININ, ED    EKG ED ECG REPORT   Date: 06/09/2016 0604am  Rate: 91  Rhythm: normal sinus rhythm  QRS Axis: left  Intervals: normal  ST/T Wave abnormalities: nonspecific ST changes  Conduction Disutrbances:left bundle branch block  Narrative Interpretation:   Old EKG Reviewed: changes noted  I have personally reviewed the EKG tracing and agree with the computerized printout as noted.  Radiology Dg Chest 2 View  Result Date: 06/09/2016 CLINICAL DATA:  Shortness of breath. EXAM: CHEST  2 VIEW COMPARISON:  08/27/2015. FINDINGS: Cardiac pacer with lead tip over right ventricle. Heart size  stable. Diffuse bilateral pulmonary infiltrates/edema. No pleural effusion or pneumothorax. IMPRESSION: Diffuse bilateral pulmonary infiltrates/edema. Cardiac pacer stable position. Heart size stable. Electronically Signed   By: Marcello Moores  Register   On: 06/09/2016 06:49    Procedures Procedures (including critical care time)  Medications Ordered in ED Medications  nitroGLYCERIN (NITROGLYN) 2 % ointment 1 inch (not administered)  furosemide (LASIX) injection 40 mg (not administered)     Initial Impression / Assessment and Plan /  ED Course  I have reviewed the triage vital signs and the nursing notes.  Pertinent labs & imaging results that were available during my care of the patient were reviewed by me and considered in my medical decision making (see chart for details).     7:17 AM Pt in the ED for worsening SOB over past 24 hours He was clearly tachypneic on evaluation Labs/imaging consistent with CHF Will need admission Lasix/NTG ordered D/w cardiology (dr Debara Pickett) he will be admitted to their service Pt updated on plan He is awake/alert, no CP reported  Final Clinical Impressions(s) / ED Diagnoses   Final diagnoses:  Acute combined systolic and diastolic congestive heart failure [facility])    New Prescriptions New Prescriptions   No medications on file     Ripley Fraise, MD 06/09/16 [phone]

## 2016-06-09 NOTE — Progress Notes (Signed)
  Echocardiogram 2D Echocardiogram with Definity has been performed.  Leonard [name] 06/09/2016, 4:40 PM

## 2016-06-09 NOTE — ED Notes (Signed)
Attempted to call report

## 2016-06-09 NOTE — ED Triage Notes (Addendum)
Pt reports that yesterday he began to be SOB, this morning it became worse.  He states that at first it was only when he reclined but now it does not depend on his position.  Pt admits that he is under a large amount of stress after the passing of his adult daughter.  Pt reports no chest pain at this time.

## 2016-06-09 NOTE — ED Notes (Signed)
Patient transported to X-ray 

## 2016-06-09 NOTE — H&P (Signed)
Cardiology History & Physical    Patient ID: Leonard [name] MRN: [id number], DOB: 1945-12-13 Date of Encounter: 06/09/2016, 8:07 AM Primary Physician: Girtha Rm, NP-C Primary Cardiologist:: Dr. Ellyn Hack Electrophysiology: Dr. Caryl Comes  Chief Complaint: Progressive SOB Reason for Admission: CHF exacerbation Requesting MD: Dr. Christy Gentles  HPI: Leonard [name] is a 71 y.o. male with history of  posterior STEMI/PCI with two overlapping DES to RCA  07/2015- patent f/u cath 08/2015, STJ single chamber ICD implanted 2017 for VT, combined systolic and diastolic CHF  (EF 54-00%08/6759), hyperlipidemia, hypertension, morbid obesity who is being seen today for the evaluation of CHF exacerbation at the request of Dr. Christy Gentles.  Presented as a STEMI 08/12/2015; PCI with DES x 2 that admission. Presentedf with VT  08/27/2015 and subsequently had DDCV with ICD implantation shortly after for secondary prevention by Dr. Caryl Comes. He is currently enrolled in the cardiac rehabilitation maintenance program.  The patient came to the Emergency Department early this morning for shortness of breath on exertion and orthopnea that started yesterday. Last night he got to the point where he could not find a comfortable position. He is under a significant amount of stress due to his daughter passing away in October of 2017- she had been a quadriplegic for 25 years after a diving accident at age 50. He has not experiences any CP or back pain. No fever, nausea or vomiting. No worsening or change in leg edema. The patient was tachypneic and short of breath on arrival.  BNP is 853, no Troponin ordered, chest xray with diffuse bilateral pulmonary infiltrates/edema, stable cardiac pacer and stable heart size. He was given 40mg  IV Lasix and has diuresed approximately 200 mLs.  Blood pressure 153/95, creatinine 1.37, WBC 19.2.    He reports improvement since his arrival to the ER, . Denies any CP or feeling his defibrillator fire.     Past Medical History:  Diagnosis Date  . Anxiety   . Atherosclerotic heart disease of native coronary artery with angina pectoris ([employer])    a. 07/2015 Posterior STEMI/PCI: LM nl, LAd 40ost, RI 40, RCA 125m (3.0x22 Resolute Integrity DES distal, 3.0x12 Resolute Integrity DES prox), 90d (PTCA), RPDA small, nl, RPLB2 100 - too small for PTCA. -- patent in f/u cath 08/2015  . Chronic combined systolic and diastolic CHF (congestive heart failure) ([place])    a. 07/2015 Ehco: EF 45-50%, basal-mid inf and infsept AK, inflat, apical inf, and apical septal HK, Gr1 DD, triv MR.  [name] Hyperlipidemia with target low density lipoprotein (LDL) cholesterol less than 70 mg/dL   . Hypertensive heart disease   . Ischemic cardiomyopathy    a. 07/2015 Echo: EF 45-50%.; Follow-up Echo 08/23/2015: EF 50-55% with mild inferior hypokinesis.  . Morbid obesity ([place])   . ST elevation myocardial infarction (STEMI) of inferior wall ([place]) 08/12/2015   Subacute presentation for inferior MI with post infarction angina on the following day. EKG still shows injury current. Therefore decided to proceed with intervention of 100% mRCA thrombotic lesion.  . Sustained ventricular fibrillation ([place]) 08/2015   Underwent EP evaluation with final ICD implantation.     Surgical History:  Past Surgical History:  Procedure Laterality Date  . CARDIAC CATHETERIZATION N/A 08/12/2015   Procedure: Left Heart Cath and Coronary Angiography;  Surgeon: Leonie Man, MD;  Location: [facility];  Service: Cardiovascular: Inferior STEMI: 100% mid RCA, 90% distal RCA, 100% RPL. 40% ostial and proximal LAD and branch of ramus.  [name]  CARDIAC CATHETERIZATION N/A 08/26/2015   Procedure: Left Heart Cath and Coronary Angiography;  Surgeon: Belva Crome, MD;  Location: [facility] [place] [facility];  Service: Cardiovascular: To evaluate sustained VT. Widely patent RCA stent and distal PTCA site. Also patent RPL branch. LAD lesion appeared to be more consistent with  50-55% and 40%. Otherwise stable from previous cath. EF estimated 35-45%.  [name] CARDIOVERSION N/A 08/23/2015   Procedure: CARDIOVERSION;  Surgeon: Deboraha Sprang, MD;  Location: [place];  Service: Cardiovascular;  Laterality: N/A;  . CORONARY ANGIOPLASTY WITH STENT PLACEMENT  08/12/2015   Mid RCA 100% reduced to 0% with 2 overlapping Resolute DES.  3.0 x 22 mm with a 3.0 x 12 mm prox overlarp (postdilated to 3.4 mm); PTCA of dRCA 90%. (Very dificult,complex case - tortuous, [place] RCA - unable to advance longer stents.  RPL2 noted to have thromboembolic 254% occlusion - unable to reach.  . ELECTROPHYSIOLOGIC STUDY N/A 08/26/2015   Procedure: Electrophysiology Study;  Surgeon: Deboraha Sprang, MD;  Location: [facility] [facility];  Service: Cardiovascular;  Laterality: N/A;  . EP IMPLANTABLE DEVICE N/A 08/26/2015   Procedure: ICD Implant;  Surgeon: Deboraha Sprang, MD;  Location: [facility];  Service: Cardiovascular;  Laterality: LEFT:  St Jude ICD, serial number [id number].   [name] HEMORRHOID SURGERY    . SKIN SURGERY    . TRANSTHORACIC ECHOCARDIOGRAM  08/13/2015; 08/23/2015   a. Mild concentric LVH. EF 45-50%. Basal-mid inferior and inferoseptal akinesis with hypokinesis of inferolateral and apical inferior wall. 1 DD. ;; b. Technically difficult study. Definity contrast administered.  Compared to a prior echo in 07/2015, the LVEF is slightly higher at 50-55% with mild inferior hypokinesis.     Home Meds: Prior to Admission medications   Medication Sig Start Date End Date Taking? Authorizing Provider  aspirin EC 81 MG tablet Take 1 tablet (81 mg total) by mouth daily. 08/14/15  Yes Arbutus Leas, NP  atorvastatin (LIPITOR) 80 MG tablet Take 1 tablet (80 mg total) by mouth daily at 6 PM. 08/14/15  Yes Arbutus Leas, NP  cholecalciferol (VITAMIN D) 1000 units tablet Take 2,000 Units by mouth daily.    Yes [provider]  Coenzyme Q10 (COQ10) 200 MG CAPS Take 200 mg by mouth daily.   Yes [provider]  irbesartan (AVAPRO) 300 MG tablet Take 1 tablet (300 mg total) by mouth daily. 12/16/15  Yes Leonie Man, MD  magnesium oxide (MAG-OX) 400 MG tablet Take 1 tablet (400 mg total) by mouth 2 (two) times daily. 12/16/15  Yes Leonie Man, MD  metoprolol succinate (TOPROL-XL) 25 MG 24 hr tablet Take 25 mg by mouth daily. 05/17/16  Yes [provider]  Multiple Vitamin (MULTIVITAMIN) tablet Take 1 tablet by mouth daily.   Yes [provider]  nitroGLYCERIN (NITROSTAT) 0.4 MG SL tablet Place 1 tablet (0.4 mg total) under the tongue every 5 (five) minutes as needed for chest pain. 08/14/15  Yes Arbutus Leas, NP  Omega-3 Fatty Acids (FISH OIL) 1200 MG CPDR Take 1 tablet by mouth daily.   Yes [provider]  ticagrelor (BRILINTA) 90 MG TABS tablet Take 1 tablet (90 mg total) by mouth 2 (two) times daily. 08/14/15  Yes Arbutus Leas, NP  venlafaxine (EFFEXOR) 75 MG tablet TAKE 1 TABLET BY MOUTH EVERY DAY 05/04/16  Yes Henson, Vickie L, NP-C  metoprolol succinate (TOPROL-XL) 50 MG 24 hr tablet TAKE 1 TABLET BY  MOUTH DAILY WITH OR IMMEDIATELY FOLLOWING A MEAL Patient not taking: Reported on 06/09/2016 05/19/16   Leonie Man, MD    Allergies:  Allergies  Allergen Reactions  . Other Itching    Ragweed causes watery eyes, itching, sneezing  . Sulfa Antibiotics Other (See Comments)    Pt unsure of reaction, mother told him he was allergic     Social History   Social History  . Marital status: Married    Spouse name: N/A  . Number of children: N/A  . Years of education: N/A   Occupational History  . Not on file.   Social History Main Topics  . Smoking status: Never Smoker  . Smokeless tobacco: Never Used  . Alcohol use 1.8 - 2.4 oz/week    3 - 4 Shots of liquor per week     Comment: 3-4 glasses of scotch 3-4 days per week  . Drug use: No  . Sexual activity: Not on file   Other Topics Concern  . Not on file   Social History Narrative    He tells me that his daughter died on Halloween 2015/05/02. She had a diving accident at the age of 5 and been quadriplegic for 25 years. She had been incredibly vigorous girl. He showed me her high school graduation picture.She was as he reported beautiful.  Her name was [name].   He is still quite upset     Family History  Problem Relation Age of Onset  . Diabetes Mother   . Heart disease Paternal Grandmother   . Heart disease Paternal Grandfather     Review of Systems: General: negative for chills, fever, night sweats or weight changes.  Cardiovascular: negative for chest pain, edema, + orthopnea, palpitations, paroxysmal nocturnal dyspnea, shortness of breath or dyspnea on exertion Dermatological: negative for rash Respiratory: negative for cough or wheezing Urologic: negative for hematuria Abdominal: negative for nausea, vomiting, diarrhea, bright red blood per rectum, melena, or hematemesis Neurologic: negative for visual changes, syncope, or dizziness All other systems reviewed and are otherwise negative except as noted above.  Labs:  Lab Results  Component Value Date   WBC 19.2 (H) 06/09/2016   HGB 15.5 06/09/2016   HCT 47.9 06/09/2016   MCV 101.9 (H) 06/09/2016   PLT 253 06/09/2016    Recent Labs Lab 06/09/16 0615  NA 137  K 4.5  CL 103  CO2 23  BUN 20  CREATININE 1.37*  CALCIUM 9.2  GLUCOSE 155*   No results for input(s): CKTOTAL, CKMB, TROPONINI in the last 72 hours. Lab Results  Component Value Date   CHOL 163 08/12/2015   HDL 41 08/12/2015   LDLCALC 98 08/12/2015   TRIG 122 08/12/2015   No results found for: DDIMER  Radiology/Studies:  Dg Chest 2 View  Result Date: 06/09/2016 CLINICAL DATA:  Shortness of breath. EXAM: CHEST  2 VIEW COMPARISON:  08/27/2015. FINDINGS: Cardiac pacer with lead tip over right ventricle. Heart size stable. Diffuse bilateral pulmonary infiltrates/edema. No pleural effusion or pneumothorax. IMPRESSION: Diffuse bilateral pulmonary  infiltrates/edema. Cardiac pacer stable position. Heart size stable. Electronically Signed   By: Marcello Moores  Register   On: 06/09/2016 06:49   Wt Readings from Last 3 Encounters:  06/09/16 (!) 305 lb 9.6 oz (138.6 kg)  03/25/16 (!) 303 lb 8 oz (137.7 kg)  03/19/16 (!) 304 lb 6.4 oz (138.1 kg)    EKG: HR 91, nonspecific ST changes. LBBB, personally reviewed  Physical Exam: Blood pressure (!) 158/89, pulse  88, temperature 98.6 F (37 C), temperature source Oral, resp. rate (!) 22, height 6' 1.5" (1.867 m), weight (!) 305 lb 9.6 oz (138.6 kg), SpO2 92 %. Body mass index is 39.77 kg/m. General: Well developed, well nourished, in no acute distress. Morbidly obsese Head: Normocephalic, atraumatic, sclera non-icteric, no xanthomas, nares are without discharge.  Neck: Negative for carotid bruits. JVD not elevated. Lungs: Clear bilaterally to auscultation without wheezes. Breathing is mildly labored with mild rhonchi at the bases. Heart: RRR with S1 S2. No murmurs, rubs, or gallops appreciated. Abdomen: Soft, non-tender, non-distended with normoactive bowel sounds. No hepatomegaly. No rebound/guarding. No obvious abdominal masses. Msk:  Strength and tone appear normal for age. Extremities: No clubbing or cyanosis. No edema.  Distal pedal pulses are 2+ and equal bilaterally. Neuro: Alert and oriented X 3. No focal deficit. No facial asymmetry. Moves all extremities spontaneously. Psych:  Responds to questions appropriately with a normal affect.    Assessment and Plan   Combined systolic and diastolic CHF: He has not had any CP or palpitations associated with his SOB. Clinically appears fluid overloaded which is supported by his chest xray and BNP > 800. He has been given 40 mg IV Lasix and has already diuresed ~ 200 mLs. He is not on a home maintenance dose of lasix. Creatinine at admission is 1.37, his baseline creatiine is ~1.00. Echocardiogram from 08/2015, EF 50-55% with mild inferior hypokinesis  and LVH. --  Continue BB --  Lasix 40 mg BID --  Lipid panel, CMP in the AM --  [employer] and strict I/O's  AKI: 2017 creatinine ~ 1 today on admission it is 1.37.  Hypertension:  BP 142/82 - 153/98, on Irbesartan and Toprol-XL  Hyperlipidemia: On Lipitor 80 mg, discussed due for repeat LDL and liver enzymes  ICM/CAD: No chest pain, continue home medication of Brilinta and aspirin.  Hx of VT with ICD implant: No defibrillator firing or CP.  Recommend admission obs for diuresis overnight. No CP or weakness.  Signed, Linus Mako PA-C 06/09/2016, 8:07 AM

## 2016-06-09 NOTE — ED Notes (Signed)
Cardiology at bedside.

## 2016-06-10 ENCOUNTER — Encounter (HOSPITAL_COMMUNITY)
Admission: EM | Disposition: A | Discharge status: Home / Self Care | Payer: Self-pay | Source: Home / Self Care | Attending: Internal Medicine

## 2016-06-10 ENCOUNTER — Other Ambulatory Visit: Payer: Self-pay

## 2016-06-10 ENCOUNTER — Encounter (HOSPITAL_COMMUNITY): Payer: Self-pay

## 2016-06-10 ENCOUNTER — Encounter (HOSPITAL_COMMUNITY): Payer: Self-pay | Admitting: Cardiology

## 2016-06-10 DIAGNOSIS — I447 Left bundle-branch block, unspecified: Secondary | ICD-10-CM | POA: Diagnosis not present

## 2016-06-10 DIAGNOSIS — N179 Acute kidney failure, unspecified: Secondary | ICD-10-CM | POA: Diagnosis present

## 2016-06-10 DIAGNOSIS — Z95810 Presence of automatic (implantable) cardiac defibrillator: Secondary | ICD-10-CM | POA: Diagnosis not present

## 2016-06-10 DIAGNOSIS — I5041 Acute combined systolic (congestive) and diastolic (congestive) heart failure: Secondary | ICD-10-CM | POA: Diagnosis not present

## 2016-06-10 DIAGNOSIS — I2510 Atherosclerotic heart disease of native coronary artery without angina pectoris: Secondary | ICD-10-CM | POA: Diagnosis not present

## 2016-06-10 DIAGNOSIS — I5043 Acute on chronic combined systolic (congestive) and diastolic (congestive) heart failure: Secondary | ICD-10-CM | POA: Diagnosis present

## 2016-06-10 DIAGNOSIS — Z6838 Body mass index (BMI) 38.0-38.9, adult: Secondary | ICD-10-CM | POA: Diagnosis not present

## 2016-06-10 DIAGNOSIS — Z79899 Other long term (current) drug therapy: Secondary | ICD-10-CM | POA: Diagnosis not present

## 2016-06-10 DIAGNOSIS — I255 Ischemic cardiomyopathy: Secondary | ICD-10-CM | POA: Diagnosis present

## 2016-06-10 DIAGNOSIS — Z955 Presence of coronary angioplasty implant and graft: Secondary | ICD-10-CM | POA: Diagnosis not present

## 2016-06-10 DIAGNOSIS — Z7982 Long term (current) use of aspirin: Secondary | ICD-10-CM | POA: Diagnosis not present

## 2016-06-10 DIAGNOSIS — I272 Pulmonary hypertension, unspecified: Secondary | ICD-10-CM | POA: Diagnosis present

## 2016-06-10 DIAGNOSIS — Z882 Allergy status to sulfonamides status: Secondary | ICD-10-CM | POA: Diagnosis not present

## 2016-06-10 DIAGNOSIS — E785 Hyperlipidemia, unspecified: Secondary | ICD-10-CM | POA: Diagnosis present

## 2016-06-10 DIAGNOSIS — I252 Old myocardial infarction: Secondary | ICD-10-CM | POA: Diagnosis not present

## 2016-06-10 DIAGNOSIS — I130 Hypertensive heart and chronic kidney disease with heart failure and stage 1 through stage 4 chronic kidney disease, or unspecified chronic kidney disease: Secondary | ICD-10-CM | POA: Diagnosis present

## 2016-06-10 DIAGNOSIS — F419 Anxiety disorder, unspecified: Secondary | ICD-10-CM | POA: Diagnosis present

## 2016-06-10 HISTORY — PX: RIGHT/LEFT HEART CATH AND CORONARY ANGIOGRAPHY: CATH118266

## 2016-06-10 LAB — POCT I-STAT 3, ART BLOOD GAS (G3+)
Bicarbonate: 24.3 mmol/L (ref 20.0–28.0)
O2 Saturation: 96 %
TCO2: 25 mmol/L (ref 0–100)
pCO2 arterial: 38.8 mmHg (ref 32.0–48.0)
pH, Arterial: 7.405 (ref 7.350–7.450)
pO2, Arterial: 81 mmHg — ABNORMAL LOW (ref 83.0–108.0)

## 2016-06-10 LAB — COMPREHENSIVE METABOLIC PANEL
ALT: 25 U/L (ref 17–63)
AST: 29 U/L (ref 15–41)
Albumin: 3.8 g/dL (ref 3.5–5.0)
Alkaline Phosphatase: 77 U/L (ref 38–126)
Anion gap: 12 (ref 5–15)
BILIRUBIN TOTAL: 1.4 mg/dL — AB (ref 0.3–1.2)
BUN: 24 mg/dL — AB (ref 6–20)
CO2: 25 mmol/L (ref 22–32)
CREATININE: 1.46 mg/dL — AB (ref 0.61–1.24)
Calcium: 9 mg/dL (ref 8.9–10.3)
Chloride: 99 mmol/L — ABNORMAL LOW (ref 101–111)
GFR calc Af Amer: 53 mL/min — ABNORMAL LOW (ref >60)
GFR, EST NON AFRICAN AMERICAN: 46 mL/min — AB (ref >60)
Glucose, Bld: 121 mg/dL — ABNORMAL HIGH (ref 65–99)
POTASSIUM: 3.8 mmol/L (ref 3.5–5.1)
Sodium: 136 mmol/L (ref 135–145)
TOTAL PROTEIN: 6.8 g/dL (ref 6.5–8.1)

## 2016-06-10 LAB — POCT I-STAT 3, VENOUS BLOOD GAS (G3P V)
Acid-Base Excess: 2 mmol/L (ref 0.0–2.0)
Bicarbonate: 27.5 mmol/L (ref 20.0–28.0)
O2 SAT: 55 %
TCO2: 29 mmol/L (ref 0–100)
pCO2, Ven: 44.6 mmHg (ref 44.0–60.0)
pH, Ven: 7.398 (ref 7.250–7.430)
pO2, Ven: 29 mmHg — CL (ref 32.0–45.0)

## 2016-06-10 LAB — CBC
HCT: 45.7 % (ref 39.0–52.0)
Hemoglobin: 15 g/dL (ref 13.0–17.0)
MCH: 32.8 pg (ref 26.0–34.0)
MCHC: 32.8 g/dL (ref 30.0–36.0)
MCV: 100 fL (ref 78.0–100.0)
Platelets: 249 10*3/uL (ref 150–400)
RBC: 4.57 MIL/uL (ref 4.22–5.81)
RDW: 14.2 % (ref 11.5–15.5)
WBC: 13.5 10*3/uL — ABNORMAL HIGH (ref 4.0–10.5)

## 2016-06-10 LAB — LIPID PANEL
CHOLESTEROL: 134 mg/dL (ref 0–200)
HDL: 34 mg/dL — ABNORMAL LOW (ref >40)
LDL Cholesterol: 68 mg/dL (ref 0–99)
Total CHOL/HDL Ratio: 3.9 RATIO
Triglycerides: 160 mg/dL — ABNORMAL HIGH (ref <150)
VLDL: 32 mg/dL (ref 0–40)

## 2016-06-10 LAB — CREATININE, SERUM
CREATININE: 1.44 mg/dL — AB (ref 0.61–1.24)
GFR calc Af Amer: 54 mL/min — ABNORMAL LOW (ref >60)
GFR, EST NON AFRICAN AMERICAN: 47 mL/min — AB (ref >60)

## 2016-06-10 LAB — GLUCOSE, CAPILLARY
Glucose-Capillary: 126 mg/dL — ABNORMAL HIGH (ref 65–99)
Glucose-Capillary: 115 mg/dL — ABNORMAL HIGH (ref 65–99)

## 2016-06-10 SURGERY — RIGHT/LEFT HEART CATH AND CORONARY ANGIOGRAPHY

## 2016-06-10 MED ORDER — SODIUM CHLORIDE 0.9 % IV SOLN
INTRAVENOUS | Status: DC
  Administered 2016-06-10: 12:00:00 via INTRAVENOUS

## 2016-06-10 MED ORDER — HEPARIN (PORCINE) IN NACL 2-0.9 UNIT/ML-% IJ SOLN
INTRAMUSCULAR | Status: AC
  Filled 2016-06-10: qty 1000

## 2016-06-10 MED ORDER — SODIUM CHLORIDE 0.9% FLUSH
3.0000 mL | Freq: Two times a day (BID) | INTRAVENOUS | Status: DC
  Administered 2016-06-10: 3 mL via INTRAVENOUS

## 2016-06-10 MED ORDER — HEPARIN SODIUM (PORCINE) 1000 UNIT/ML IJ SOLN
INTRAMUSCULAR | Status: DC | PRN
  Administered 2016-06-10: 6000 [IU] via INTRAVENOUS

## 2016-06-10 MED ORDER — SODIUM CHLORIDE 0.9% FLUSH: 3.0000 mL | INTRAVENOUS | Status: DC | PRN

## 2016-06-10 MED ORDER — LIDOCAINE HCL (PF) 1 % IJ SOLN
INTRAMUSCULAR | Status: AC
  Filled 2016-06-10: qty 30

## 2016-06-10 MED ORDER — FENTANYL CITRATE (PF) 100 MCG/2ML IJ SOLN
INTRAMUSCULAR | Status: DC | PRN
  Administered 2016-06-10: 25 ug via INTRAVENOUS

## 2016-06-10 MED ORDER — MIDAZOLAM HCL 2 MG/2ML IJ SOLN
INTRAMUSCULAR | Status: AC
  Filled 2016-06-10: qty 2

## 2016-06-10 MED ORDER — IOPAMIDOL (ISOVUE-370) INJECTION 76%
INTRAVENOUS | Status: DC | PRN
  Administered 2016-06-10: 70 mL via INTRAVENOUS

## 2016-06-10 MED ORDER — FUROSEMIDE 40 MG PO TABS
40.0000 mg | ORAL_TABLET | Freq: Every day | ORAL | Status: DC
  Administered 2016-06-11: 40 mg via ORAL
  Filled 2016-06-10: qty 1

## 2016-06-10 MED ORDER — VERAPAMIL HCL 2.5 MG/ML IV SOLN
INTRAVENOUS | Status: AC
  Filled 2016-06-10: qty 2

## 2016-06-10 MED ORDER — LIDOCAINE HCL (PF) 1 % IJ SOLN
INTRAMUSCULAR | Status: DC | PRN
  Administered 2016-06-10: 3 mL

## 2016-06-10 MED ORDER — HEPARIN (PORCINE) IN NACL 2-0.9 UNIT/ML-% IJ SOLN
INTRAMUSCULAR | Status: AC | PRN
  Administered 2016-06-10: 1000 mL

## 2016-06-10 MED ORDER — HEPARIN SODIUM (PORCINE) 1000 UNIT/ML IJ SOLN
INTRAMUSCULAR | Status: AC
  Filled 2016-06-10: qty 1

## 2016-06-10 MED ORDER — MIDAZOLAM HCL 2 MG/2ML IJ SOLN
INTRAMUSCULAR | Status: DC | PRN
  Administered 2016-06-10: 1 mg via INTRAVENOUS

## 2016-06-10 MED ORDER — IOPAMIDOL (ISOVUE-370) INJECTION 76%
INTRAVENOUS | Status: AC
  Filled 2016-06-10: qty 100

## 2016-06-10 MED ORDER — VERAPAMIL HCL 2.5 MG/ML IV SOLN
INTRAVENOUS | Status: DC | PRN
  Administered 2016-06-10: 10 mL via INTRA_ARTERIAL

## 2016-06-10 MED ORDER — HEPARIN SODIUM (PORCINE) 5000 UNIT/ML IJ SOLN
5000.0000 [IU] | Freq: Three times a day (TID) | INTRAMUSCULAR | Status: DC
  Administered 2016-06-11 (×2): 5000 [IU] via SUBCUTANEOUS
  Filled 2016-06-10 (×2): qty 1

## 2016-06-10 MED ORDER — SODIUM CHLORIDE 0.9% FLUSH
3.0000 mL | Freq: Two times a day (BID) | INTRAVENOUS | Status: DC
  Administered 2016-06-10 – 2016-06-11 (×2): 3 mL via INTRAVENOUS

## 2016-06-10 MED ORDER — SODIUM CHLORIDE 0.9 % IV SOLN
INTRAVENOUS | Status: AC | PRN
  Administered 2016-06-10: 10 mL/h via INTRAVENOUS

## 2016-06-10 MED ORDER — SODIUM CHLORIDE 0.9 % IV SOLN: 250.0000 mL | INTRAVENOUS | Status: DC | PRN

## 2016-06-10 MED ORDER — FENTANYL CITRATE (PF) 100 MCG/2ML IJ SOLN
INTRAMUSCULAR | Status: AC
  Filled 2016-06-10: qty 2

## 2016-06-10 SURGICAL SUPPLY — 12 items
CATH BALLN WEDGE 5F 110CM (CATHETERS) ×1 IMPLANT
CATH INFINITI 5 FR JL3.5 (CATHETERS) ×1 IMPLANT
CATH INFINITI JR4 5F (CATHETERS) ×1 IMPLANT
DEVICE RAD COMP TR BAND LRG (VASCULAR PRODUCTS) ×1 IMPLANT
GLIDESHEATH SLEND SS 6F .021 (SHEATH) ×1 IMPLANT
GUIDEWIRE INQWIRE 1.5J.035X260 (WIRE) IMPLANT
INQWIRE 1.5J .035X260CM (WIRE) ×2
KIT HEART LEFT (KITS) ×2 IMPLANT
PACK CARDIAC CATHETERIZATION (CUSTOM PROCEDURE TRAY) ×2 IMPLANT
SHEATH GLIDE SLENDER 4/5FR (SHEATH) ×1 IMPLANT
TRANSDUCER W/STOPCOCK (MISCELLANEOUS) ×2 IMPLANT
TUBING CIL FLEX 10 FLL-RA (TUBING) ×2 IMPLANT

## 2016-06-10 NOTE — Progress Notes (Signed)
Removed TR band after withdrawing air 30 mim ago. No bleeding noted. Applied gauze and tegaderm dsg. Elevated rt arm on pillow, instructed pt not to lift or use rt arm and post TR band care . Applied restricted arm band to rt arm. Pt tolerated well

## 2016-06-10 NOTE — H&P (View-Only) (Signed)
 DAILY PROGRESS NOTE   Patient Name: Leonard [name] Date of Encounter: 06/10/2016  Hospital Problem List   Active Problems:   Combined systolic and diastolic heart failure (HCC)    Chief Complaint   I'm breathing better  Subjective   Diuresed 2.5L overnight. Breathing much better today. BP improved. Device interrogation demonstrated increasing impedence consistent with CHF. Echo yesterday shows newly reduced LVEF to 20-25% - appears to be diffuse hypokinesis and dyssynchrony with high filling pressure, no LV thrombus. RVSP 53 mmHg, moderate LAE. Possible thinning of the apex, anterior septum and anterior wall, ?new LAD territory infarct. Cath in 08/2015 showed a 55-60% ostial LAD lesion.  Objective   Vitals:   06/09/16 1556 06/09/16 1938 06/10/16 0031 06/10/16 0453  BP: 132/73 138/67 125/70 121/64  Pulse: (!) 48 (!) 53 86 84  Resp:  18 20 18  Temp: 98.2 F (36.8 C) 97.9 F (36.6 C) 97.4 F (36.3 C) 97.6 F (36.4 C)  TempSrc: Oral Oral Oral Oral  SpO2: 95% 97% 96% 98%  Weight:    294 lb 1.6 oz (133.4 kg)  Height:        Intake/Output Summary (Last 24 hours) at 06/10/16 0856 Last data filed at 06/10/16 0852  Gross per 24 hour  Intake              240 ml  Output             3050 ml  Net            -2810 ml   Filed Weights   06/09/16 0647 06/09/16 1049 06/10/16 0453  Weight: (!) 305 lb 9.6 oz (138.6 kg) (!) 300 lb 14.4 oz (136.5 kg) 294 lb 1.6 oz (133.4 kg)    Physical Exam   General appearance: alert, no distress and morbidly obese Lungs: clear to auscultation bilaterally Heart: regular rate and rhythm, S1, S2 normal and S3 present Extremities: edema trace to 1+ edema Neurologic: Grossly normal  Inpatient Medications    Scheduled Meds: . aspirin EC  81 mg Oral Daily  . atorvastatin  80 mg Oral q1800  . cholecalciferol  2,000 Units Oral Daily  . irbesartan  300 mg Oral Daily  . magnesium oxide  400 mg Oral BID  . metoprolol succinate  25 mg Oral  Daily  . multivitamin with minerals  1 tablet Oral Daily  . omega-3 acid ethyl esters  1 g Oral BID  . sodium chloride flush  3 mL Intravenous Q12H  . ticagrelor  90 mg Oral BID  . venlafaxine  75 mg Oral Daily    Continuous Infusions: . sodium chloride      PRN Meds: sodium chloride, acetaminophen, nitroGLYCERIN, ondansetron (ZOFRAN) IV, sodium chloride flush   Labs   Results for orders placed or performed during the hospital encounter of 06/09/16 (from the past 48 hour(s))  Basic metabolic panel     Status: Abnormal   Collection Time: 06/09/16  6:15 AM  Result Value Ref Range   Sodium 137 135 - 145 mmol/L   Potassium 4.5 3.5 - 5.1 mmol/L   Chloride 103 101 - 111 mmol/L   CO2 23 22 - 32 mmol/L   Glucose, Bld 155 (H) 65 - 99 mg/dL   BUN 20 6 - 20 mg/dL   Creatinine, Ser 1.37 (H) 0.61 - 1.24 mg/dL   Calcium 9.2 8.9 - 10.3 mg/dL   GFR calc non Af Amer 50 (L) >60 mL/min   GFR calc Af   Amer 57 (L) >60 mL/min    Comment: (NOTE) The eGFR has been calculated using the CKD EPI equation. This calculation has not been validated in all clinical situations. eGFR's persistently <60 mL/min signify possible Chronic Kidney Disease.    Anion gap 11 5 - 15  CBC     Status: Abnormal   Collection Time: 06/09/16  6:15 AM  Result Value Ref Range   WBC 19.2 (H) 4.0 - 10.5 K/uL   RBC 4.70 4.22 - 5.81 MIL/uL   Hemoglobin 15.5 13.0 - 17.0 g/dL   HCT 47.9 39.0 - 52.0 %   MCV 101.9 (H) 78.0 - 100.0 fL   MCH 33.0 26.0 - 34.0 pg   MCHC 32.4 30.0 - 36.0 g/dL   RDW 14.2 11.5 - 15.5 %   Platelets 253 150 - 400 K/uL  Brain natriuretic peptide     Status: Abnormal   Collection Time: 06/09/16  6:15 AM  Result Value Ref Range   B Natriuretic Peptide 853.2 (H) 0.0 - 100.0 pg/mL  I-stat troponin, ED     Status: None   Collection Time: 06/09/16  6:23 AM  Result Value Ref Range   Troponin i, poc 0.02 0.00 - 0.08 ng/mL   Comment 3            Comment: Due to the release kinetics of cTnI, a negative  result within the first hours of the onset of symptoms does not rule out myocardial infarction with certainty. If myocardial infarction is still suspected, repeat the test at appropriate intervals.   MRSA PCR Screening     Status: None   Collection Time: 06/09/16 12:04 PM  Result Value Ref Range   MRSA by PCR NEGATIVE NEGATIVE    Comment:        The GeneXpert MRSA Assay (FDA approved for NASAL specimens only), is one component of a comprehensive MRSA colonization surveillance program. It is not intended to diagnose MRSA infection nor to guide or monitor treatment for MRSA infections.   Comprehensive metabolic panel     Status: Abnormal   Collection Time: 06/10/16  4:09 AM  Result Value Ref Range   Sodium 136 135 - 145 mmol/L   Potassium 3.8 3.5 - 5.1 mmol/L   Chloride 99 (L) 101 - 111 mmol/L   CO2 25 22 - 32 mmol/L   Glucose, Bld 121 (H) 65 - 99 mg/dL   BUN 24 (H) 6 - 20 mg/dL   Creatinine, Ser 1.46 (H) 0.61 - 1.24 mg/dL   Calcium 9.0 8.9 - 10.3 mg/dL   Total Protein 6.8 6.5 - 8.1 g/dL   Albumin 3.8 3.5 - 5.0 g/dL   AST 29 15 - 41 U/L   ALT 25 17 - 63 U/L   Alkaline Phosphatase 77 38 - 126 U/L   Total Bilirubin 1.4 (H) 0.3 - 1.2 mg/dL   GFR calc non Af Amer 46 (L) >60 mL/min   GFR calc Af Amer 53 (L) >60 mL/min    Comment: (NOTE) The eGFR has been calculated using the CKD EPI equation. This calculation has not been validated in all clinical situations. eGFR's persistently <60 mL/min signify possible Chronic Kidney Disease.    Anion gap 12 5 - 15  Lipid panel     Status: Abnormal   Collection Time: 06/10/16  4:09 AM  Result Value Ref Range   Cholesterol 134 0 - 200 mg/dL   Triglycerides 160 (H) <150 mg/dL   HDL 34 (L) >40 mg/dL  Total CHOL/HDL Ratio 3.9 RATIO   VLDL 32 0 - 40 mg/dL   LDL Cholesterol 68 0 - 99 mg/dL    Comment:        Total Cholesterol/HDL:CHD Risk Coronary Heart Disease Risk Table                     Men   Women  1/2 Average Risk   3.4    3.3  Average Risk       5.0   4.4  2 X Average Risk   9.6   7.1  3 X Average Risk  23.4   11.0        Use the calculated Patient Ratio above and the CHD Risk Table to determine the patient's CHD Risk.        ATP [name] CLASSIFICATION (LDL):  <100     mg/dL   Optimal  100-129  mg/dL   Near or Above                    Optimal  130-159  mg/dL   Borderline  160-189  mg/dL   High  >190     mg/dL   Very High     ECG   Demand pacing at 87 - Personally Reviewed  Telemetry   Paced rhythm - Personally Reviewed  Radiology    Dg Chest 2 View  Result Date: 06/09/2016 CLINICAL DATA:  Shortness of breath. EXAM: CHEST  2 VIEW COMPARISON:  08/27/2015. FINDINGS: Cardiac pacer with lead tip over right ventricle. Heart size stable. Diffuse bilateral pulmonary infiltrates/edema. No pleural effusion or pneumothorax. IMPRESSION: Diffuse bilateral pulmonary infiltrates/edema. Cardiac pacer stable position. Heart size stable. Electronically Signed   By: Marcello Moores  Register   On: 06/09/2016 06:49    Cardiac Studies   LV EF: 20% -   25%  ------------------------------------------------------------------- Indications:      CHF - 428.0.  ------------------------------------------------------------------- History:   PMH:   Myocardial infarction.  Risk factors: Hypertension. Morbidly obese.  ------------------------------------------------------------------- Study Conclusions  - Left ventricle: The cavity size was mildly dilated. Wall   thickness was increased in a pattern of mild LVH. Left   ventricular geometry showed evidence of eccentric hypertrophy.   Systolic function was severely reduced. The estimated ejection   fraction was in the range of 20% to 25%. Severe diffuse   hypokinesis with distinct regional wall motion abnormalities.   There is disproportionately severe hypokinesis and thinning of   the apex, anterior septum and anterior wall. However, there is   severe dyssynchrony ,  making it difficult to assess regional   function. Features are consistent with a pseudonormal left   ventricular filling pattern, with concomitant abnormal relaxation   and increased filling pressure (grade 2 diastolic dysfunction).   Acoustic contrast opacification revealed no evidence ofthrombus. - Ventricular septum: Septal motion showed abnormal function,   dyssynergy, and paradox. - Aortic valve: There was trivial regurgitation. - Mitral valve: There was mild regurgitation. - Left atrium: The atrium was moderately dilated. - Pulmonary arteries: Systolic pressure was moderately increased.   PA peak pressure: 53 mm Hg (S).   Assessment   1. Active Problems: 2.   Combined systolic and diastolic heart failure ([place]) 3.   Plan   1. Mr. [name] is much improved today - feels 100% better. Diuresed over 3L - small increase in creatinine secondary to aggressive diuresis. Echo yesterday shows additional decline in LVEF to 20-25%. There are likely regional anterior wall  motion abnormalities - he had a 60% proximal LAD lesion 1 year ago. I am recommending a repeat L/RHC to assess for any lesions amenable to PCI - may need to consider FFR of the LAD if borderline. Will continue to work on medical therapy for CHF. Other possibilities for decline in EF could be etoh-related, pacer-related?, stress-induced (daughter died 6 months ago suddenly). Hold additional diuretics today and start oral lasix tomorrow. I discussed cath with him today, including risks, benefits and alternatives and he is willing to proceed. Keep NPO until cath.  Time Spent Directly with Patient:  15 minutes  Length of Stay:  LOS: 0 days   Zaydah Nawabi C. Fuller Makin, MD, FACC    [facility]  Attending Cardiologist  Direct Dial: [phone]  Fax: [phone]  Website:  [email or web address]  Rinoa Garramone C Sebrena Engh 06/10/2016, 8:56 AM   

## 2016-06-10 NOTE — Care Management Obs Status (Signed)
[facility] NOTIFICATION   Patient Details  Name: Cove Haydon MRN: [id number] Date of Birth: 03/05/1945   Medicare Observation Status Notification Given:  Yes    Carles Collet, RN 06/10/2016, 2:15 PM

## 2016-06-10 NOTE — Progress Notes (Signed)
DAILY PROGRESS NOTE   Patient Name: Leonard [name] Date of Encounter: 06/10/2016  Hospital Problem List   Active Problems:   Combined systolic and diastolic heart failure [facility])    Chief Complaint   I'm breathing better  Subjective   Diuresed 2.5L overnight. Breathing much better today. BP improved. Device interrogation demonstrated increasing impedence consistent with CHF. Echo yesterday shows newly reduced LVEF to 20-25% - appears to be diffuse hypokinesis and dyssynchrony with high filling pressure, no LV thrombus. RVSP 53 mmHg, moderate LAE. Possible thinning of the apex, anterior septum and anterior wall, ?new LAD territory infarct. Cath in 08/2015 showed a 55-60% ostial LAD lesion.  Objective   Vitals:   06/09/16 1556 06/09/16 1938 06/10/16 0031 06/10/16 0453  BP: 132/73 138/67 125/70 121/64  Pulse: (!) 48 (!) 53 86 84  Resp:  _0 Temp: 98.2 F (36.8 C) 97.9 F (36.6 C) 97.4 F (36.3 C) 97.6 F (36.4 C)  TempSrc: Oral Oral Oral Oral  SpO2: 95% 97% 96% 98%  Weight:    294 lb 1.6 oz (133.4 kg)  Height:        Intake/Output Summary (Last 24 hours) at 06/10/16 0856 Last data filed at 06/10/16 1194  Gross per 24 hour  Intake              240 ml  Output             3050 ml  Net            -2810 ml   Filed Weights   06/09/16 0647 06/09/16 1049 06/10/16 0453  Weight: (!) 305 lb 9.6 oz (138.6 kg) (!) 300 lb 14.4 oz (136.5 kg) 294 lb 1.6 oz (133.4 kg)    Physical Exam   General appearance: alert, no distress and morbidly obese Lungs: clear to auscultation bilaterally Heart: regular rate and rhythm, S1, S2 normal and S3 present Extremities: edema trace to 1+ edema Neurologic: Grossly normal  Inpatient Medications    Scheduled Meds: . aspirin EC  81 mg Oral Daily  . atorvastatin  80 mg Oral q1800  . cholecalciferol  2,000 Units Oral Daily  . irbesartan  300 mg Oral Daily  . magnesium oxide  400 mg Oral BID  . metoprolol succinate  25 mg Oral  Daily  . multivitamin with minerals  1 tablet Oral Daily  . omega-3 acid ethyl esters  1 g Oral BID  . sodium chloride flush  3 mL Intravenous Q12H  . ticagrelor  90 mg Oral BID  . venlafaxine  75 mg Oral Daily    Continuous Infusions: . sodium chloride      PRN Meds: sodium chloride, acetaminophen, nitroGLYCERIN, ondansetron (ZOFRAN) IV, sodium chloride flush   Labs   Results for orders placed or performed during the hospital encounter of 06/09/16 (from the past 48 hour(s))  Basic metabolic panel     Status: Abnormal   Collection Time: 06/09/16  6:15 AM  Result Value Ref Range   Sodium 137 135 - 145 mmol/L   Potassium 4.5 3.5 - 5.1 mmol/L   Chloride 103 101 - 111 mmol/L   CO2 23 22 - 32 mmol/L   Glucose, Bld 155 (H) 65 - 99 mg/dL   BUN 20 6 - 20 mg/dL   Creatinine, Ser 1.37 (H) 0.61 - 1.24 mg/dL   Calcium 9.2 8.9 - 10.3 mg/dL   GFR calc non Af Amer 50 (L) >60 mL/min   GFR calc Af  Amer 57 (L) >60 mL/min    Comment: (NOTE) The eGFR has been calculated using the CKD EPI equation. This calculation has not been validated in all clinical situations. eGFR's persistently <60 mL/min signify possible Chronic Kidney Disease.    Anion gap 11 5 - 15  CBC     Status: Abnormal   Collection Time: 06/09/16  6:15 AM  Result Value Ref Range   WBC 19.2 (H) 4.0 - 10.5 K/uL   RBC 4.70 4.22 - 5.81 MIL/uL   Hemoglobin 15.5 13.0 - 17.0 g/dL   HCT 47.9 39.0 - 52.0 %   MCV 101.9 (H) 78.0 - 100.0 fL   MCH 33.0 26.0 - 34.0 pg   MCHC 32.4 30.0 - 36.0 g/dL   RDW 14.2 11.5 - 15.5 %   Platelets 253 150 - 400 K/uL  Brain natriuretic peptide     Status: Abnormal   Collection Time: 06/09/16  6:15 AM  Result Value Ref Range   B Natriuretic Peptide 853.2 (H) 0.0 - 100.0 pg/mL  I-stat troponin, ED     Status: None   Collection Time: 06/09/16  6:23 AM  Result Value Ref Range   Troponin i, poc 0.02 0.00 - 0.08 ng/mL   Comment 3            Comment: Due to the release kinetics of cTnI, a negative  result within the first hours of the onset of symptoms does not rule out myocardial infarction with certainty. If myocardial infarction is still suspected, repeat the test at appropriate intervals.   MRSA PCR Screening     Status: None   Collection Time: 06/09/16 12:04 PM  Result Value Ref Range   MRSA by PCR NEGATIVE NEGATIVE    Comment:        The GeneXpert MRSA Assay (FDA approved for NASAL specimens only), is one component of a comprehensive MRSA colonization surveillance program. It is not intended to diagnose MRSA infection nor to guide or monitor treatment for MRSA infections.   Comprehensive metabolic panel     Status: Abnormal   Collection Time: 06/10/16  4:09 AM  Result Value Ref Range   Sodium 136 135 - 145 mmol/L   Potassium 3.8 3.5 - 5.1 mmol/L   Chloride 99 (L) 101 - 111 mmol/L   CO2 25 22 - 32 mmol/L   Glucose, Bld 121 (H) 65 - 99 mg/dL   BUN 24 (H) 6 - 20 mg/dL   Creatinine, Ser 1.46 (H) 0.61 - 1.24 mg/dL   Calcium 9.0 8.9 - 10.3 mg/dL   Total Protein 6.8 6.5 - 8.1 g/dL   Albumin 3.8 3.5 - 5.0 g/dL   AST 29 15 - 41 U/L   ALT 25 17 - 63 U/L   Alkaline Phosphatase 77 38 - 126 U/L   Total Bilirubin 1.4 (H) 0.3 - 1.2 mg/dL   GFR calc non Af Amer 46 (L) >60 mL/min   GFR calc Af Amer 53 (L) >60 mL/min    Comment: (NOTE) The eGFR has been calculated using the CKD EPI equation. This calculation has not been validated in all clinical situations. eGFR's persistently <60 mL/min signify possible Chronic Kidney Disease.    Anion gap 12 5 - 15  Lipid panel     Status: Abnormal   Collection Time: 06/10/16  4:09 AM  Result Value Ref Range   Cholesterol 134 0 - 200 mg/dL   Triglycerides 160 (H) <150 mg/dL   HDL 34 (L) >40 mg/dL  Total CHOL/HDL Ratio 3.9 RATIO   VLDL 32 0 - 40 mg/dL   LDL Cholesterol 68 0 - 99 mg/dL    Comment:        Total Cholesterol/HDL:CHD Risk Coronary Heart Disease Risk Table                     Men   Women  1/2 Average Risk   3.4    3.3  Average Risk       5.0   4.4  2 X Average Risk   9.6   7.1  3 X Average Risk  23.4   11.0        Use the calculated Patient Ratio above and the CHD Risk Table to determine the patient's CHD Risk.        ATP [name] CLASSIFICATION (LDL):  <100     mg/dL   Optimal  100-129  mg/dL   Near or Above                    Optimal  130-159  mg/dL   Borderline  160-189  mg/dL   High  >190     mg/dL   Very High     ECG   Demand pacing at 87 - Personally Reviewed  Telemetry   Paced rhythm - Personally Reviewed  Radiology    Dg Chest 2 View  Result Date: 06/09/2016 CLINICAL DATA:  Shortness of breath. EXAM: CHEST  2 VIEW COMPARISON:  08/27/2015. FINDINGS: Cardiac pacer with lead tip over right ventricle. Heart size stable. Diffuse bilateral pulmonary infiltrates/edema. No pleural effusion or pneumothorax. IMPRESSION: Diffuse bilateral pulmonary infiltrates/edema. Cardiac pacer stable position. Heart size stable. Electronically Signed   By: Marcello Moores  Register   On: 06/09/2016 06:49    Cardiac Studies   LV EF: 20% -   25%  ------------------------------------------------------------------- Indications:      CHF - 428.0.  ------------------------------------------------------------------- History:   PMH:   Myocardial infarction.  Risk factors: Hypertension. Morbidly obese.  ------------------------------------------------------------------- Study Conclusions  - Left ventricle: The cavity size was mildly dilated. Wall   thickness was increased in a pattern of mild LVH. Left   ventricular geometry showed evidence of eccentric hypertrophy.   Systolic function was severely reduced. The estimated ejection   fraction was in the range of 20% to 25%. Severe diffuse   hypokinesis with distinct regional wall motion abnormalities.   There is disproportionately severe hypokinesis and thinning of   the apex, anterior septum and anterior wall. However, there is   severe dyssynchrony ,  making it difficult to assess regional   function. Features are consistent with a pseudonormal left   ventricular filling pattern, with concomitant abnormal relaxation   and increased filling pressure (grade 2 diastolic dysfunction).   Acoustic contrast opacification revealed no evidence ofthrombus. - Ventricular septum: Septal motion showed abnormal function,   dyssynergy, and paradox. - Aortic valve: There was trivial regurgitation. - Mitral valve: There was mild regurgitation. - Left atrium: The atrium was moderately dilated. - Pulmonary arteries: Systolic pressure was moderately increased.   PA peak pressure: 53 mm Hg (S).   Assessment   1. Active Problems: 2.   Combined systolic and diastolic heart failure ([place]) 3.   Plan   1. Mr. [name] is much improved today - feels 100% better. Diuresed over 3L - small increase in creatinine secondary to aggressive diuresis. Echo yesterday shows additional decline in LVEF to 20-25%. There are likely regional anterior wall  motion abnormalities - he had a 60% proximal LAD lesion 1 year ago. I am recommending a repeat L/RHC to assess for any lesions amenable to PCI - may need to consider FFR of the LAD if borderline. Will continue to work on medical therapy for CHF. Other possibilities for decline in EF could be etoh-related, pacer-related?, stress-induced (daughter died 6 months ago suddenly). Hold additional diuretics today and start oral lasix tomorrow. I discussed cath with him today, including risks, benefits and alternatives and he is willing to proceed. Keep NPO until cath.  Time Spent Directly with Patient:  15 minutes  Length of Stay:  LOS: 0 days   Pixie Casino, MD, [place]  Attending Cardiologist  Direct Dial: [phone]  Fax: [phone]  Website:  [email or web address]  06/10/2016, 8:56 AM

## 2016-06-10 NOTE — Progress Notes (Signed)
After removing 6/13cc air from TR band, pt began to have scant bleed at radial site.  Replaced air in TR band and will begin again.  Pt is aymptomatic.  Will monitor.  Bhagat, PA-C has been notified.

## 2016-06-10 NOTE — Interval H&P Note (Signed)
History and Physical Interval Note:  06/10/2016 10:26 AM  Leonard [name]  has presented today for surgery, with the diagnosis of hf  The various methods of treatment have been discussed with the patient and family. After consideration of risks, benefits and other options for treatment, the patient has consented to  Procedure(s): Right/Left Heart Cath and Coronary Angiography (N/A) as a surgical intervention .  The patient's history has been reviewed, patient examined, no change in status, stable for surgery.  I have reviewed the patient's chart and labs.  Questions were answered to the patient's satisfaction.    Cath Lab Visit (complete for each Cath Lab visit)  Clinical Evaluation Leading to the Procedure:   ACS: No.  Non-ACS:    Anginal Classification: CCS II  Anti-ischemic medical therapy: Minimal Therapy (1 class of medications)  Non-Invasive Test Results: No non-invasive testing performed  Prior CABG: No previous CABG       Collier Salina [facility] 06/10/2016  10:26 AM

## 2016-06-10 NOTE — Care Management Note (Signed)
Case Management Note  Patient Details  Name: Leonard [name] MRN: [id number] Date of Birth: October 19, 1945  Subjective/Objective:                 Spoke with patient at the bedside, he states he is from home with wife. Patient describes himself as independent. He drives, denies difficulty getting to pharmacy or MD, or paying for medications. Patient states he has a cane at home he uses on unsteady ground, but otherwise does not use anything for ambulation.  Patient for cardiac cath today. PCP Dr Raenette Rover   Action/Plan:  CM will continue to follow.   Expected Discharge Date:                  Expected Discharge Plan:  Home/Self Care  In-House Referral:     Discharge planning Services  CM Consult  Post Acute Care Choice:    Choice offered to:     DME Arranged:    DME Agency:     HH Arranged:    HH Agency:     Status of Service:  In process, will continue to follow  If discussed at Long Length of Stay Meetings, dates discussed:    Additional Comments:  Carles Collet, RN 06/10/2016, 10:08 AM

## 2016-06-11 DIAGNOSIS — N179 Acute kidney failure, unspecified: Secondary | ICD-10-CM

## 2016-06-11 DIAGNOSIS — I447 Left bundle-branch block, unspecified: Secondary | ICD-10-CM

## 2016-06-11 MED ORDER — STUDY - GALACTIC STUDY - PLACEBO / OMECAMTIV MECARBIL (PI-MCLEAN)
1.0000 | Freq: Two times a day (BID) | Status: DC
  Administered 2016-06-11: 1 via ORAL
  Filled 2016-06-11: qty 1

## 2016-06-11 MED ORDER — STUDY - GALACTIC STUDY - PLACEBO / OMECAMTIV MECARBIL (PI-MCLEAN)
1.0000 | Freq: Once | Status: AC
  Administered 2016-06-11: 1 via ORAL
  Filled 2016-06-11: qty 1

## 2016-06-11 MED ORDER — FUROSEMIDE 40 MG PO TABS: 40.0000 mg | ORAL_TABLET | Freq: Every day | ORAL | 6 refills | Status: DC

## 2016-06-11 NOTE — Research (Signed)
Galactic Heart Failure Informed Consent   Subject Name: Leonard [name]  Subject met inclusion and exclusion criteria.  The informed consent form, study requirements and expectations were reviewed with the subject and questions and concerns were addressed prior to the signing of the consent form.  The subject verbalized understanding of the trail requirements.  The subject agreed to participate in the Galactic Heart Failure trial and signed the informed consent.  The informed consent was obtained prior to performance of any protocol-specific procedures for the subject.  A copy of the signed informed consent was given to the subject and a copy was placed in the subject's medical record.  ,  L  

## 2016-06-11 NOTE — Consult Note (Signed)
   [facility] CM Inpatient Consult   06/11/2016  Leonard [name] April 11, 1945 [id number]   Patient screened for potential [facility] services for HF follow up in the [facility] [place]. Met with the patient at the bedside.  Patient endorses [name] with [facility] as his primary care provider.  He states he feels he has a good understanding of his cardiac issues and continues to be in the Cardiac Rehab maintenance program and plans to follow up tomorrow for his scheduled time.  He denies any medication issues with cost or adherence, he continues to drive, has good family support. No [facility] Care Management needs identified. Patient accepted a brochure and 24 hour nurse line magnet for his benefits. For questions contact:   Natividad Brood, RN BSN [facility] Liaison  [phone] business mobile phone Toll free office [phone]

## 2016-06-11 NOTE — Discharge Summary (Addendum)
Discharge Summary    Patient ID: Leonard [name],  MRN: [id number], DOB/AGE: 73-22-1947 73 y.o.  Admit date: 06/09/2016 Discharge date: 06/11/2016 Primary Cardiologist:: Dr. Ellyn Hack Electrophysiology: Dr. Caryl Comes  Discharge Diagnoses    Principal Problem:   Acute on chronic combined systolic and diastolic heart failure ([place]) - NYHA Class II symptoms Active Problems:   Essential hypertension   Hyperlipidemia LDL goal <70   Ischemic cardiomyopathy   AKI (acute kidney injury) ([facility])   Allergies Allergies  Allergen Reactions  . Other Itching    Ragweed causes watery eyes, itching, sneezing  . Sulfa Antibiotics Other (See Comments)    Pt unsure of reaction, mother told him he was allergic     Diagnostic Studies/Procedures    Echo 06/09/16 Study Conclusions  - Left ventricle: The cavity size was mildly dilated. Wall thickness was increased in a pattern of mild LVH. Left ventricular geometry showed evidence of eccentric hypertrophy. Systolic function was severely reduced. The estimated ejection fraction was in the range of 20% to 25%. Severe diffuse hypokinesis with distinct regional wall motion abnormalities. There is disproportionately severe hypokinesis and thinning of the apex, anterior septum and anterior wall. However, there is severe dyssynchrony , making it difficult to assess regional function. Features are consistent with a pseudonormal left ventricular filling pattern, with concomitant abnormal relaxation and increased filling pressure (grade 2 diastolic dysfunction). Acoustic contrast opacification revealed no evidence ofthrombus. - Ventricular septum: Septal motion showed abnormal function, dyssynergy, and paradox. - Aortic valve: There was trivial regurgitation. - Mitral valve: There was mild regurgitation. - Left atrium: The atrium was moderately dilated. - Pulmonary arteries: Systolic pressure was moderately increased. PA  peak pressure: 53 mm Hg (S).  Right/Left Heart Cath and Coronary Angiography  06/10/16  Conclusion     Ramus lesion, 40 %stenosed.  Ost RCA to Prox RCA lesion, 10 %stenosed.  Mid RCA lesion, 0 %stenosed.  Dist RCA lesion, 10 %stenosed.  Ost LM to LM lesion, 30 %stenosed.  Ost LAD lesion, 40 %stenosed.  LV end diastolic pressure is moderately elevated.  Hemodynamic findings consistent with mild pulmonary hypertension.  1. Nonobstructive CAD. Continued patency of stents in the RCA 2. Elevated LVEDP 3. Mild pulmonary HTN. 4. Reduced cardiac output. 3.89 L/min with index of 1.5.  Plan: Medical management.      History of Present Illness     Leonard [name] IIIis a 71 y.o.malewith history ofposterior STEMI/PCI with two overlapping DES to RCA 07/2015- patent f/u cath 08/2015, STJ single chamber ICD implanted 2069for VT, combined systolic and diastolic CHF (EF 09-73% 05/3297), hyperlipidemia, hypertension and morbid obesity who is seen evaluation of CHF.  Hx of STEMI 08/12/2015; PCI with DES x 2 that admission. Presentedf with VT  08/27/2015 and subsequently had DDCV with ICD implantation shortly after for secondary prevention by Dr. Caryl Comes. He is currently enrolled in the cardiac rehabilitation maintenance program.  The patient came to the Emergency Department 5/22  for shortness of breath on exertion and orthopnea. he was not on a diuretic at home. History of etoh abuse - drinks 3/day. Recently lost his daughter and has been grieving and drinking more, diet consists of processed foods.   BNP is 853, no Troponin ordered, chest xray with diffuse bilateral pulmonary infiltrates/edema, stable cardiac pacer and stable heart size. He was given 40mg  IV Lasix  In ER with improved symptoms and has diuresed approximately 200 mLs.  Blood pressure 153/95, creatinine 1.37, WBC 19.2.    Denies any  CP or feeling his defibrillator fire.    Hospital Course     Consultants: None  1.  Acute on chronic combined CHF - BNP 853. CXR showed diffuse pulmonary edema. Device interrogation demonstrated increasing impedence consistent with CHF. Echo showed  newly reduced LVEF to 20-25% - appears to be diffuse hypokinesis and dyssynchrony with high filling pressure, no LV thrombus. RVSP 53 mmHg, moderate LAE. Cath showed patent stent with non obstructive CAD and mild pHTN. Reduced cardiac output. 3.89 L/min with index of 1.5. - Initially diuresed with IV lasix now on daily lasix po 40mg  qd. Net I & O negative 2.8L. Wight down 7lb (300-->293lb). Symptoms resolved. Continue Lasix 40mg  qd, Avapro 300mg  qd and Toprol XL 25mg .  - He was identified as a potential candidate for the [facility] CHF research study - he was screened for that today by the clinical research team prior to discharge.    2. CAD s/p  Overlapping DES to RCA 07/2015 - Cath showed patent stent.  Nonobstructive CAD otherwise as above.  - No angina. Continue ASA, Brillinta and statin.   3. HLD - 06/10/2016: Cholesterol 134; HDL 34; LDL Cholesterol 68; Triglycerides 160; VLDL 32  - Continue statin  4. HTN - Stable and well controlled on current medication.   5. AKI with presumed CKD, stage III - GER of 40-50s. Seen baseline Scr of 1.1. Scr was normal in 09/2015. At admission Scr was 1.37-->1.46-->1.44 (today). BMET during follow up.   6. 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 Given the wide LBBB at 172 ms - may need to consider CRT-D upgrade with Dr. Caryl Comes. Has follow up next month.     The patient has been seen by Dr. Debara Pickett  today and deemed ready for discharge home. All follow-up appointments have been scheduled. Discharge medications are listed below.  _____________   Discharge Vitals Blood pressure 119/62, pulse 96, temperature 97.7 F (36.5 C), temperature source Oral, resp. rate 18, height 6\' 1"  (1.854 m), weight 293 lb 6.4 oz (133.1 kg), SpO2 97 %.  Filed Weights   06/09/16 1049 06/10/16 0453  06/11/16 0545  Weight: (!) 300 lb 14.4 oz (136.5 kg) 294 lb 1.6 oz (133.4 kg) 293 lb 6.4 oz (133.1 kg)    Labs & Radiologic Studies     CBC  Recent Labs  06/09/16 0615 06/10/16 1249  WBC 19.2* 13.5*  HGB 15.5 15.0  HCT 47.9 45.7  MCV 101.9* 100.0  PLT 253 242   Basic Metabolic Panel  Recent Labs  06/09/16 0615 06/10/16 0409 06/10/16 1249  NA 137 136  --   K 4.5 3.8  --   CL 103 99*  --   CO2 23 25  --   GLUCOSE 155* 121*  --   BUN 20 24*  --   CREATININE 1.37* 1.46* 1.44*  CALCIUM 9.2 9.0  --    Liver Function Tests  Recent Labs  06/10/16 0409  AST 29  ALT 25  ALKPHOS 77  BILITOT 1.4*  PROT 6.8  ALBUMIN 3.8   No results for input(s): LIPASE, AMYLASE in the last 72 hours. Cardiac Enzymes No results for input(s): CKTOTAL, CKMB, CKMBINDEX, TROPONINI in the last 72 hours. BNP Invalid input(s): POCBNP D-Dimer No results for input(s): DDIMER in the last 72 hours. Hemoglobin A1C No results for input(s): HGBA1C in the last 72 hours. Fasting Lipid Panel  Recent Labs  06/10/16 0409  CHOL 134  HDL 34*  LDLCALC 68  TRIG 160*  CHOLHDL 3.9   Thyroid Function Tests No results for input(s): TSH, T4TOTAL, T3FREE, THYROIDAB in the last 72 hours.  Invalid input(s): FREET3  Dg Chest 2 View  Result Date: 06/09/2016 CLINICAL DATA:  Shortness of breath. EXAM: CHEST  2 VIEW COMPARISON:  08/27/2015. FINDINGS: Cardiac pacer with lead tip over right ventricle. Heart size stable. Diffuse bilateral pulmonary infiltrates/edema. No pleural effusion or pneumothorax. IMPRESSION: Diffuse bilateral pulmonary infiltrates/edema. Cardiac pacer stable position. Heart size stable. Electronically Signed   By: Marcello Moores  Register   On: 06/09/2016 06:49    Disposition   Pt is being discharged home today in good condition.  Follow-up Plans & Appointments    Follow-up Information    Lacon, Vickie L, NP-C. Go on 06/18/2016.   Specialty:  Family Medicine Why:  @10 :Max Sane  information: [place] [address] [phone]        Almyra Deforest, [place] Follow up on 06/25/2016.   Specialties:  Cardiology, Radiology Why:  @8 :30am for post hospital. please arrive 15 mins early Contact information: [address] [place] [address] [phone]          Discharge Instructions    Diet - low sodium heart healthy    Complete by:  As directed    Discharge instructions    Complete by:  As directed    *Weigh yourself on the same scale at same time of day and keep a log. *Report weight gain of > 3 lbs in 1 day or 5 lbs over the course of a week and/or symptoms of excess fluid (shortness of breath, difficulty lying flat, swelling, poor appetite, abdominal fullness/bloating, etc) to your doctor immediately. *Avoid foods that are high in sodium (processed, pre-packaged/canned goods, fast foods, etc). *Please attend all scheduled and reccommended follow up appointments   Increase activity slowly    Complete by:  As directed       Discharge Medications   Current Discharge Medication List    START taking these medications   Details  furosemide (LASIX) 40 MG tablet Take 1 tablet (40 mg total) by mouth daily. Qty: 30 tablet, Refills: 6      CONTINUE these medications which have NOT CHANGED   Details  aspirin EC 81 MG tablet Take 1 tablet (81 mg total) by mouth daily.    atorvastatin (LIPITOR) 80 MG tablet Take 1 tablet (80 mg total) by mouth daily at 6 PM. Qty: 30 tablet, Refills: 12    cholecalciferol (VITAMIN D) 1000 units tablet Take 2,000 Units by mouth daily.     Coenzyme Q10 (COQ10) 200 MG CAPS Take 200 mg by mouth daily.    irbesartan (AVAPRO) 300 MG tablet Take 1 tablet (300 mg total) by mouth daily. Qty: 90 tablet, Refills: 3    magnesium oxide (MAG-OX) 400 MG tablet Take 1 tablet (400 mg total) by mouth 2 (two) times daily. Qty: 60 tablet, Refills: 6   Associated Diagnoses: Ventricular tachycardia (HCC)      metoprolol succinate (TOPROL-XL) 25 MG 24 hr tablet Take 25 mg by mouth daily. Refills: 6    Multiple Vitamin (MULTIVITAMIN) tablet Take 1 tablet by mouth daily.    nitroGLYCERIN (NITROSTAT) 0.4 MG SL tablet Place 1 tablet (0.4 mg total) under the tongue every 5 (five) minutes as needed for chest pain. Qty: 25 tablet, Refills: 2    Omega-3 Fatty Acids (FISH OIL) 1200 MG CPDR Take 1 tablet by mouth daily.    ticagrelor (BRILINTA) 90  MG TABS tablet Take 1 tablet (90 mg total) by mouth 2 (two) times daily. Qty: 60 tablet, Refills: 12    venlafaxine (EFFEXOR) 75 MG tablet TAKE 1 TABLET BY MOUTH EVERY DAY Qty: 30 tablet, Refills: 1           Outstanding Labs/Studies   BMET during follow up  Duration of Discharge Encounter   Greater than 30 minutes including physician time.  Signed, Bhagat,Bhavinkumar PA-C 06/11/2016, 12:39 PM  Pt. Seen and examined. Agree with the Resident/NP/PA-C note as written.  Time Spent with Patient: 30 minutes  Pixie Casino, MD, [place]  Attending Cardiologist  Direct Dial: [phone]  Fax: [phone]  Website:  [email or web address]

## 2016-06-11 NOTE — Progress Notes (Signed)
Progress Note  Patient Name: Leonard [name] Date of Encounter: 06/11/2016  Primary Cardiologist:: Dr. Ellyn Hack Electrophysiology: Dr. Caryl Comes  Subjective   Dyspnea resolved. No chest pain or palpitations.   Inpatient Medications    Scheduled Meds: . aspirin EC  81 mg Oral Daily  . atorvastatin  80 mg Oral q1800  . cholecalciferol  2,000 Units Oral Daily  . furosemide  40 mg Oral Daily  . heparin  5,000 Units Subcutaneous Q8H  . irbesartan  300 mg Oral Daily  . magnesium oxide  400 mg Oral BID  . metoprolol succinate  25 mg Oral Daily  . multivitamin with minerals  1 tablet Oral Daily  . omega-3 acid ethyl esters  1 g Oral BID  . sodium chloride flush  3 mL Intravenous Q12H  . ticagrelor  90 mg Oral BID  . venlafaxine  75 mg Oral Daily   Continuous Infusions: . sodium chloride     PRN Meds: sodium chloride, acetaminophen, nitroGLYCERIN, ondansetron (ZOFRAN) IV, sodium chloride flush   Vital Signs    Vitals:   06/10/16 1500 06/10/16 1952 06/11/16 0035 06/11/16 0545  BP: 97/63 122/75 122/75 (!) 135/94  Pulse: 71 61 79 93  Resp: 18 18 18    Temp: 97.7 F (36.5 C) 97.9 F (36.6 C) 98 F (36.7 C) 97.7 F (36.5 C)  TempSrc: Oral Oral Oral Oral  SpO2:  94% 94% 97%  Weight:    293 lb 6.4 oz (133.1 kg)  Height:        Intake/Output Summary (Last 24 hours) at 06/11/16 0905 Last data filed at 06/11/16 0844  Gross per 24 hour  Intake              895 ml  Output              975 ml  Net              -80 ml   Filed Weights   06/09/16 1049 06/10/16 0453 06/11/16 0545  Weight: (!) 300 lb 14.4 oz (136.5 kg) 294 lb 1.6 oz (133.4 kg) 293 lb 6.4 oz (133.1 kg)    Telemetry    Sinus rhythm with PACs and PVCs- Personally Reviewed  ECG  None today   Physical Exam   GEN: No acute distress.   Neck: No JVD Cardiac: RRR, no murmurs, rubs, or gallops.  Respiratory: Clear to auscultation bilaterally. GI: Soft, nontender, non-distended  MS: No edema; No  deformity. Neuro:  Nonfocal  Psych: Normal affect   Labs    Chemistry Recent Labs Lab 06/09/16 0615 06/10/16 0409 06/10/16 1249  NA 137 136  --   K 4.5 3.8  --   CL 103 99*  --   CO2 23 25  --   GLUCOSE 155* 121*  --   BUN 20 24*  --   CREATININE 1.37* 1.46* 1.44*  CALCIUM 9.2 9.0  --   PROT  --  6.8  --   ALBUMIN  --  3.8  --   AST  --  29  --   ALT  --  25  --   ALKPHOS  --  77  --   BILITOT  --  1.4*  --   GFRNONAA 50* 46* 47*  GFRAA 57* 53* 54*  ANIONGAP 11 12  --      Hematology Recent Labs Lab 06/09/16 0615 06/10/16 1249  WBC 19.2* 13.5*  RBC 4.70 4.57  HGB 15.5 15.0  HCT  47.9 45.7  MCV 101.9* 100.0  MCH 33.0 32.8  MCHC 32.4 32.8  RDW 14.2 14.2  PLT 253 249    Cardiac EnzymesNo results for input(s): TROPONINI in the last 168 hours.  Recent Labs Lab 06/09/16 0623  TROPIPOC 0.02     BNP Recent Labs Lab 06/09/16 0615  BNP 853.2*     DDimer No results for input(s): DDIMER in the last 168 hours.   Radiology    No results found.  Cardiac Studies   Echo 06/09/16 Study Conclusions  - Left ventricle: The cavity size was mildly dilated. Wall   thickness was increased in a pattern of mild LVH. Left   ventricular geometry showed evidence of eccentric hypertrophy.   Systolic function was severely reduced. The estimated ejection   fraction was in the range of 20% to 25%. Severe diffuse   hypokinesis with distinct regional wall motion abnormalities.   There is disproportionately severe hypokinesis and thinning of   the apex, anterior septum and anterior wall. However, there is   severe dyssynchrony , making it difficult to assess regional   function. Features are consistent with a pseudonormal left   ventricular filling pattern, with concomitant abnormal relaxation   and increased filling pressure (grade 2 diastolic dysfunction).   Acoustic contrast opacification revealed no evidence ofthrombus. - Ventricular septum: Septal motion showed  abnormal function,   dyssynergy, and paradox. - Aortic valve: There was trivial regurgitation. - Mitral valve: There was mild regurgitation. - Left atrium: The atrium was moderately dilated. - Pulmonary arteries: Systolic pressure was moderately increased.   PA peak pressure: 53 mm Hg (S).  Right/Left Heart Cath and Coronary Angiography  06/10/16  Conclusion     Ramus lesion, 40 %stenosed.  Ost RCA to Prox RCA lesion, 10 %stenosed.  Mid RCA lesion, 0 %stenosed.  Dist RCA lesion, 10 %stenosed.  Ost LM to LM lesion, 30 %stenosed.  Ost LAD lesion, 40 %stenosed.  LV end diastolic pressure is moderately elevated.  Hemodynamic findings consistent with mild pulmonary hypertension.   1. Nonobstructive CAD. Continued patency of stents in the RCA 2. Elevated LVEDP 3. Mild pulmonary HTN. 4. Reduced cardiac output. 3.89 L/min with index of 1.5.  Plan: Medical management.     Patient Profile     Leonard [name] is a 71 y.o. male with history of  posterior STEMI/PCI with two overlapping DES to RCA  07/2015- patent f/u cath 08/2015, STJ single chamber ICD implanted 2068for VT, combined systolic and diastolic CHF  (EF 82-95% 06/2128), hyperlipidemia, hypertension and morbid obesity who is seen evaluation of CHF exacerbation at the request of Dr. Christy Gentles.  Assessment & Plan    1. Acute on chronic combined CHF - BNP 853. CXR showed diffuse pulmonary edema. Device interrogation demonstrated increasing impedence consistent with CHF. Echo showed  newly reduced LVEF to 20-25% - appears to be diffuse hypokinesis and dyssynchrony with high filling pressure, no LV thrombus. RVSP 53 mmHg, moderate LAE. Cath showed patent stent with non obstructive CAD and mild pHTN. Reduced cardiac output. 3.89 L/min with index of 1.5. - Initially diuresed with IV lasix now on daily lasix po 40mg  qd. Net I & O negative 2.8L. Wight down 7lb (300-->293lb).  - He is euvolemic now. No dyspnea or orthopnea.  Ambulating and sleeping without shortness of breath.  - Continue Lasix 40mg  qd, Avapro 300mg  qd. He is on Toprol XL 25mg . Continue vs change to another agent per MD.    2. CAD s/p  Overlapping DES to RCA  07/2015 - Cath yesterday showed patent stent.  Nonobstructive CAD otherwise.  - No angina. Continue ASA, Brillinta and statin.   3. HLD - 06/10/2016: Cholesterol 134; HDL 34; LDL Cholesterol 68; Triglycerides 160; VLDL 32  - Continue statin  4. HTN - Stable and well controlled on current medication.   5. 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 Seen baseline Scr of 1.1. Scr was normal in 09/2015. At admission Scr was 1.37-->1.46-->1.44 (today). BMET during follow up.   Likely discharge home later today.   Signed, Leanor Kail, PA  06/11/2016, 9:05 AM

## 2016-06-11 NOTE — Progress Notes (Signed)
Pt qualifies for [facility] HF Study.  Spoke with patient and wife at bedside.  Provided pt with [facility] information and informed consent.  Answered questions and concerns.  Left informed consent at bedside for patient and wife to review and discuss in private.

## 2016-06-12 ENCOUNTER — Encounter: Payer: Self-pay | Admitting: *Deleted

## 2016-06-12 ENCOUNTER — Encounter (HOSPITAL_COMMUNITY): Payer: Self-pay

## 2016-06-12 ENCOUNTER — Telehealth: Payer: Self-pay | Admitting: Family Medicine

## 2016-06-12 NOTE — Telephone Encounter (Signed)
That's great! Thanks

## 2016-06-12 NOTE — Telephone Encounter (Signed)
Pt was called concerning recent hospital stay. He states he is feeling pretty good. Current and discharge meds were reconciled. He was placed on Lasix and Toprol was decreased from 50 mg to 25 mg. All other meds stayed the same. Pt states he does have all meds filled at home. After hours protocol was discussed and appt made by the hospital was verified for 06/18/2016. Pt did have a Medicare well visit schedule for 06/24/2016. That appt was cancelled and he will reschedule when things settle down for him. Pt was informed to bring all meds with him and he verifed that he would.

## 2016-06-17 ENCOUNTER — Encounter (HOSPITAL_COMMUNITY): Payer: Self-pay

## 2016-06-18 ENCOUNTER — Ambulatory Visit (INDEPENDENT_AMBULATORY_CARE_PROVIDER_SITE_OTHER): Payer: Medicare Other | Admitting: Family Medicine

## 2016-06-18 ENCOUNTER — Encounter: Payer: Self-pay | Admitting: Family Medicine

## 2016-06-18 VITALS — BP 110/60 | HR 82 | Ht 73.0 in | Wt 294.0 lb

## 2016-06-18 DIAGNOSIS — I10 Essential (primary) hypertension: Secondary | ICD-10-CM

## 2016-06-18 DIAGNOSIS — Z09 Encounter for follow-up examination after completed treatment for conditions other than malignant neoplasm: Secondary | ICD-10-CM | POA: Diagnosis not present

## 2016-06-18 DIAGNOSIS — I5042 Chronic combined systolic (congestive) and diastolic (congestive) heart failure: Secondary | ICD-10-CM | POA: Diagnosis not present

## 2016-06-18 DIAGNOSIS — I2510 Atherosclerotic heart disease of native coronary artery without angina pectoris: Secondary | ICD-10-CM | POA: Diagnosis not present

## 2016-06-18 DIAGNOSIS — I255 Ischemic cardiomyopathy: Secondary | ICD-10-CM

## 2016-06-18 LAB — BASIC METABOLIC PANEL
BUN: 28 mg/dL — AB (ref 7–25)
CO2: 20 mmol/L (ref 20–31)
CREATININE: 1.41 mg/dL — AB (ref 0.70–1.18)
Calcium: 9.5 mg/dL (ref 8.6–10.3)
Chloride: 101 mmol/L (ref 98–110)
GLUCOSE: 114 mg/dL — AB (ref 65–99)
Potassium: 4.9 mmol/L (ref 3.5–5.3)
Sodium: 136 mmol/L (ref 135–146)

## 2016-06-18 NOTE — Progress Notes (Signed)
   Subjective:    Patient ID: Leonard [name], male    DOB: January 30, 1945, 71 y.o.   MRN: [id number]  HPI Chief Complaint  Patient presents with  . Follow-up    hospital follow up   He is here for a hospital follow up. Was admitted for acute on chronic systolic CHF.  Had another heart cath. LVEF is now 20-25%  States he is doing well. Denies chest pain, palpitations, shortness of breath, orthopnea, cough, LE edema, abdominal pain, N/V/D.   He is now is the [facility] - HF study.  Has an appointment with cardiology next week.   States he has not had any alcohol since being discharged. He is planning to get involved in the cardiac rehab program again.  Grieving over his daughter. He is tearful talking about her and states this was why he was drinking alcohol. Does not want counseling at this point.   Reviewed allergies, medications, past medical, surgical, and social history.    Review of Systems Pertinent positives and negatives in the history of present illness.     Objective:   Physical Exam  Constitutional: He is oriented to person, place, and time. He appears well-developed and well-nourished. No distress.  HENT:  Mouth/Throat: Oropharynx is clear and moist.  Eyes: Conjunctivae are normal. Pupils are equal, round, and reactive to light.  Neck: Normal range of motion. Neck supple.  Cardiovascular: Normal rate and regular rhythm.   No LE edema.   Pulmonary/Chest: Effort normal and breath sounds normal.  Neurological: He is alert and oriented to person, place, and time.  Skin: Skin is warm and dry. No pallor.  Psychiatric: He has a normal mood and affect. His behavior is normal. Thought content normal.   BP 110/60   Pulse 82   Ht 6\' 1"  (1.854 m)   Wt 294 lb (133.4 kg)   SpO2 96%   BMI 38.79 kg/m       Assessment & Plan:  Hospital discharge follow-up - Plan: Basic metabolic panel  Chronic combined systolic and diastolic CHF (congestive heart failure) (HCC) - Plan:  Basic metabolic panel  Morbid obesity ([place])  Essential hypertension - Plan: Basic metabolic panel  CAD in native artery  He reports feeling much better and has not complaints today. He appears motivated to take better care of his health. Congratulated him on stopping alcohol use since hospital admission.  Discussed strict BP control and controlling modifiable risk factors such as avoiding alcohol use, weight loss and slowly increasing his physical activity. I think re-joining the cardiac rehab program would benefit him greatly if he has the opportunity to do so but will leave this up to his cardiologist.  He is involved in HF study and will follow up with cardiology next week.  Will check BMP today.  BP is within goal today.  Discussed that he is overdue for an AWV and will have him return for this in the next few weeks.

## 2016-06-19 ENCOUNTER — Encounter (HOSPITAL_COMMUNITY): Payer: Self-pay

## 2016-06-19 DIAGNOSIS — I255 Ischemic cardiomyopathy: Secondary | ICD-10-CM | POA: Insufficient documentation

## 2016-06-19 DIAGNOSIS — I10 Essential (primary) hypertension: Secondary | ICD-10-CM | POA: Insufficient documentation

## 2016-06-19 DIAGNOSIS — I2129 ST elevation (STEMI) myocardial infarction involving other sites: Secondary | ICD-10-CM | POA: Insufficient documentation

## 2016-06-22 ENCOUNTER — Encounter: Payer: Self-pay | Admitting: *Deleted

## 2016-06-22 ENCOUNTER — Encounter (HOSPITAL_COMMUNITY)
Admission: RE | Admit: 2016-06-22 | Discharge: 2016-06-22 | Disposition: A | Discharge status: Home / Self Care | Payer: Self-pay | Source: Ambulatory Visit | Attending: Cardiology | Admitting: Cardiology

## 2016-06-22 DIAGNOSIS — Z006 Encounter for examination for normal comparison and control in clinical research program: Secondary | ICD-10-CM

## 2016-06-22 NOTE — Progress Notes (Signed)
RESEARCH ENCOUNTER  Patient ID: Leonard [name]  DOB: December 22, 1945  Leonard [name] presented to the [facility] for Week 2 visit of the [facility].  No signs/symptoms of ACS since the last visit. Participant states compliance with IP.  Labs drawn and Vitals taken:    Blood pressure 130/74, pulse 92, resp. rate 14, weight 295 lb (133.8 kg), SpO2 100 %.   [facility].

## 2016-06-24 ENCOUNTER — Encounter (HOSPITAL_COMMUNITY): Payer: Self-pay

## 2016-06-24 ENCOUNTER — Ambulatory Visit: Payer: Medicare Other | Admitting: Family Medicine

## 2016-06-25 ENCOUNTER — Ambulatory Visit: Payer: Medicare Other | Admitting: Physician Assistant

## 2016-06-25 ENCOUNTER — Encounter: Payer: Medicare Other | Admitting: *Deleted

## 2016-06-25 ENCOUNTER — Other Ambulatory Visit: Payer: Self-pay | Admitting: *Deleted

## 2016-06-25 DIAGNOSIS — I5042 Chronic combined systolic (congestive) and diastolic (congestive) heart failure: Secondary | ICD-10-CM

## 2016-06-25 MED ORDER — STUDY - INVESTIGATIONAL MEDICATION: 1.0000 | Freq: Two times a day (BID) | 99 refills | Status: DC

## 2016-06-26 ENCOUNTER — Encounter: Payer: Self-pay | Admitting: Cardiology

## 2016-06-26 ENCOUNTER — Encounter (HOSPITAL_COMMUNITY)
Admission: RE | Admit: 2016-06-26 | Discharge: 2016-06-26 | Disposition: A | Discharge status: Home / Self Care | Payer: Self-pay | Source: Ambulatory Visit | Attending: Cardiology | Admitting: Cardiology

## 2016-06-29 ENCOUNTER — Encounter (HOSPITAL_COMMUNITY): Payer: Self-pay

## 2016-06-30 ENCOUNTER — Inpatient Hospital Stay (HOSPITAL_COMMUNITY): Payer: Medicare Other

## 2016-07-01 ENCOUNTER — Encounter (HOSPITAL_COMMUNITY): Payer: Self-pay

## 2016-07-01 ENCOUNTER — Encounter: Payer: Self-pay | Admitting: Internal Medicine

## 2016-07-01 ENCOUNTER — Ambulatory Visit (INDEPENDENT_AMBULATORY_CARE_PROVIDER_SITE_OTHER): Payer: Medicare Other | Admitting: Internal Medicine

## 2016-07-01 VITALS — BP 132/84 | HR 92 | Ht 73.0 in | Wt 301.0 lb

## 2016-07-01 DIAGNOSIS — I5022 Chronic systolic (congestive) heart failure: Secondary | ICD-10-CM | POA: Diagnosis not present

## 2016-07-01 DIAGNOSIS — I2589 Other forms of chronic ischemic heart disease: Secondary | ICD-10-CM

## 2016-07-01 DIAGNOSIS — I255 Ischemic cardiomyopathy: Secondary | ICD-10-CM

## 2016-07-01 DIAGNOSIS — I472 Ventricular tachycardia: Secondary | ICD-10-CM | POA: Diagnosis not present

## 2016-07-01 DIAGNOSIS — Z95810 Presence of automatic (implantable) cardiac defibrillator: Secondary | ICD-10-CM | POA: Diagnosis not present

## 2016-07-01 DIAGNOSIS — I4729 Other ventricular tachycardia: Secondary | ICD-10-CM

## 2016-07-01 LAB — CUP PACEART INCLINIC DEVICE CHECK
HIGH POWER IMPEDANCE MEASURED VALUE: 75.375
Implantable Lead Implant Date: 20170807
Implantable Pulse Generator Implant Date: 20170807
Lead Channel Impedance Value: 462.5 Ohm
Lead Channel Pacing Threshold Amplitude: 0.75 V
Lead Channel Pacing Threshold Pulse Width: 0.5 ms
Lead Channel Sensing Intrinsic Amplitude: 12 mV
Lead Channel Setting Pacing Pulse Width: 0.5 ms
Lead Channel Setting Sensing Sensitivity: 0.5 mV
MDC IDC LEAD LOCATION: 753860
MDC IDC MSMT BATTERY REMAINING LONGEVITY: 93 mo
MDC IDC PG SERIAL: 7377761
MDC IDC SESS DTM: 20180613114021
MDC IDC SET LEADCHNL RV PACING AMPLITUDE: 2.5 V
MDC IDC STAT BRADY RV PERCENT PACED: 0 %

## 2016-07-01 MED ORDER — SACUBITRIL-VALSARTAN 49-51 MG PO TABS: 1.0000 | ORAL_TABLET | Freq: Two times a day (BID) | ORAL | 0 refills | Status: DC

## 2016-07-01 NOTE — Patient Instructions (Addendum)
Medication Instructions: - Your physician has recommended you make the following change in your medication:  1) Stop avapro (irbesartan) 2) Start entresto 49/51 mg- take one tablet by mouth twice daily ( you must be off irbesartan x 48 hours prior to starting entresto)   Labwork: - none ordered  Procedures/Testing: - Your physician has requested that you have an echocardiogram- in 3 months (just prior to follow up with Dr. Caryl Comes). Echocardiography is a painless test that uses sound waves to create images of your heart. It provides your doctor with information about the size and shape of your heart and how well your heart's chambers and valves are working. This procedure takes approximately one hour. There are no restrictions for this procedure.  Follow-Up: - Your physician recommends that you schedule a follow-up appointment in: 3 months with Dr. Caryl Comes (echo just prior)    Any Additional Special Instructions Will Be Listed Below (If Applicable).     If you need a refill on your cardiac medications before your next appointment, please call your pharmacy.

## 2016-07-01 NOTE — Progress Notes (Signed)
Patient Care Team: Girtha Rm, NP-C as PCP - General (Family Medicine)   HPI  Leonard [name] is a 71 y.o. male Seen in follow-up for ventricular tachycardia and ICD implantation for secondary prevention 8/17. He had presented in the context of ischemic heart disease prior PCI with sustained wide complex tachycardia and was cardioverted. EP testing confirmed ventricular tachycardia.  9/17   appropriate therapy for polymorphic ventricular tachycWidely patent right coronary stents,ardia with some degree of long short coupling>>  beta blockers were increased.   Date Cr K Mg  9/17  0.93 4.4 1.5  5/18  1.41 4.9     DATE TEST    8/17  Cath   EF 35-40 % Widely patent right coronary stents,Ostial 50-60% LAD  5/18   Echo   EF 20-25 %   5/18  Cath  Findings unchanged     He was hospitalized 5/18 for acute heart failure. He was found to have significant LV depression. No intercurrent ventricular tachycardia was noted  Was started on the [facility] HF protocol drug.  At this point his breathing is back to baseline. He denies shortness of breath specifically although he is limited to less than one flight of stairs. He does not have chest pain. He has scant peripheral edema. He does not have nocturnal dyspnea or orthopnea.    Records and Results Reviewed As above  He tells me that his daughter died on Halloween. She had a diving accident at the age of 57 and been quadriplegic for 25 years. She had been incredibly vigorous girl. He showed me her high school graduation picture.She was as he reported beautiful.  Her name was Leonard [name] Past Medical History:  Diagnosis Date  . AICD (automatic cardioverter/defibrillator) present 08/26/2015  . Anxiety   . Atherosclerotic heart disease of native coronary artery with angina pectoris ([place])    a. 07/2015 Posterior STEMI/PCI: LM nl, LAd 40ost, RI 40, RCA 140m (3.0x22 Resolute Integrity DES distal, 3.0x12 Resolute Integrity DES prox), 90d (PTCA),  RPDA small, nl, RPLB2 100 - too small for PTCA. -- patent in f/u cath 08/2015  . CAD (coronary artery disease) 08/12/2015  . Chronic combined systolic and diastolic CHF (congestive heart failure) ([place])    a. 07/2015 Ehco: EF 45-50%, basal-mid inf and infsept AK, inflat, apical inf, and apical septal HK, Gr1 DD, triv MR.  [name] Dyspnea   . Hyperlipidemia with target low density lipoprotein (LDL) cholesterol less than 70 mg/dL   . Hypertensive heart disease   . Ischemic cardiomyopathy    a. 07/2015 Echo: EF 45-50%.; Follow-up Echo 08/23/2015: EF 50-55% with mild inferior hypokinesis.  . Kidney disease, chronic, stage [name] (GFR 30-59 ml/min) 08/12/2015  . Morbid obesity ([place])   . ST elevation myocardial infarction (STEMI) of inferior wall ([place]) 08/12/2015   Subacute presentation for inferior MI with post infarction angina on the following day. EKG still shows injury current. Therefore decided to proceed with intervention of 100% mRCA thrombotic lesion.  . Sustained ventricular fibrillation ([place]) 08/22/2015   Underwent EP evaluation with final ICD implantation.    Past Surgical History:  Procedure Laterality Date  . CARDIAC CATHETERIZATION N/A 08/12/2015   Procedure: Left Heart Cath and Coronary Angiography;  Surgeon: Leonie Man, MD;  Location: [place] [facility];  Service: Cardiovascular: Inferior STEMI: 100% mid RCA, 90% distal RCA, 100% RPL. 40% ostial and proximal LAD and branch of ramus.  [name] CARDIAC CATHETERIZATION N/A 08/26/2015   Procedure: Left  Heart Cath and Coronary Angiography;  Surgeon: Belva Crome, MD;  Location: [place] [facility];  Service: Cardiovascular: To evaluate sustained VT. Widely patent RCA stent and distal PTCA site. Also patent RPL branch. LAD lesion appeared to be more consistent with 50-55% and 40%. Otherwise stable from previous cath. EF estimated 35-45%.  [name] CARDIOVERSION N/A 08/23/2015   Procedure: CARDIOVERSION;  Surgeon: Deboraha Sprang, MD;  Location: [place];  Service:  Cardiovascular;  Laterality: N/A;  . CORONARY ANGIOPLASTY WITH STENT PLACEMENT  08/12/2015   Mid RCA 100% reduced to 0% with 2 overlapping Resolute DES.  3.0 x 22 mm with a 3.0 x 12 mm prox overlarp (postdilated to 3.4 mm); PTCA of dRCA 90%. (Very dificult,complex case - tortuous, [place] RCA - unable to advance longer stents.  RPL2 noted to have thromboembolic 056% occlusion - unable to reach.  . ELECTROPHYSIOLOGIC STUDY N/A 08/26/2015   Procedure: Electrophysiology Study;  Surgeon: Deboraha Sprang, MD;  Location: [place] [facility];  Service: Cardiovascular;  Laterality: N/A;  . EP IMPLANTABLE DEVICE N/A 08/26/2015   Procedure: ICD Implant;  Surgeon: Deboraha Sprang, MD;  Location: [place] [facility];  Service: Cardiovascular;  Laterality: LEFT:  St Jude ICD, serial number [id number].   [name] HEMORRHOID SURGERY    . RIGHT/LEFT HEART CATH AND CORONARY ANGIOGRAPHY N/A 06/10/2016   Procedure: Right/Left Heart Cath and Coronary Angiography;  Surgeon: Martinique, Peter M, MD;  Location: [place] [facility];  Service: Cardiovascular;  Laterality: N/A;  . SKIN SURGERY    . TRANSTHORACIC ECHOCARDIOGRAM  08/13/2015; 08/23/2015   a. Mild concentric LVH. EF 45-50%. Basal-mid inferior and inferoseptal akinesis with hypokinesis of inferolateral and apical inferior wall. 1 DD. ;; b. Technically difficult study. Definity contrast administered.  Compared to a prior echo in 07/2015, the LVEF is slightly higher at 50-55% with mild inferior hypokinesis.    Current Outpatient Prescriptions  Medication Sig Dispense Refill  . aspirin EC 81 MG tablet Take 1 tablet (81 mg total) by mouth daily.    [name] atorvastatin (LIPITOR) 80 MG tablet Take 1 tablet (80 mg total) by mouth daily at 6 PM. 30 tablet 12  . cholecalciferol (VITAMIN D) 1000 units tablet Take 2,000 Units by mouth daily.     . Coenzyme Q10 (COQ10) 200 MG CAPS Take 200 mg by mouth daily.    . furosemide (LASIX) 40 MG tablet Take 1 tablet (40 mg total) by mouth daily. 30  tablet 6  . Investigational - Study Medication Take 1 tablet by mouth 2 (two) times daily. Study name: Galactic HF Study Additional study details: Omecamtiv Mecarbil or Placebo 14 each PRN  . irbesartan (AVAPRO) 300 MG tablet Take 1 tablet (300 mg total) by mouth daily. 90 tablet 3  . magnesium oxide (MAG-OX) 400 MG tablet Take 1 tablet (400 mg total) by mouth 2 (two) times daily. 60 tablet 6  . metoprolol succinate (TOPROL-XL) 25 MG 24 hr tablet Take 25 mg by mouth daily.  6  . Multiple Vitamin (MULTIVITAMIN) tablet Take 1 tablet by mouth daily.    . nitroGLYCERIN (NITROSTAT) 0.4 MG SL tablet Place 1 tablet (0.4 mg total) under the tongue every 5 (five) minutes as needed for chest pain. 25 tablet 2  . Omega-3 Fatty Acids (FISH OIL) 1200 MG CPDR Take 1 tablet by mouth daily.    . ticagrelor (BRILINTA) 90 MG TABS tablet Take 1 tablet (90 mg total) by mouth 2 (two) times daily. 60 tablet  12  . venlafaxine (EFFEXOR) 75 MG tablet TAKE 1 TABLET BY MOUTH EVERY DAY 30 tablet 1   No current facility-administered medications for this visit.     Allergies  Allergen Reactions  . Other Itching    Ragweed causes watery eyes, itching, sneezing  . Sulfa Antibiotics Other (See Comments)    Pt unsure of reaction, mother told him he was allergic       Review of Systems negative except from HPI and PMH  Physical Exam BP 132/84   Pulse 92   Ht 6\' 1"  (1.854 m)   Wt (!) 301 lb (136.5 kg)   SpO2 98%   BMI 39.71 kg/m  Well developed and .Morbidly obese  in no acute distress HENT normal Neck supple with JVP-10 Carotids brisk and full without bruits Clear Regular rate and rhythm, no murmurs or gallops Abd-soft with active BS without hepatomegaly No Clubbing cyanosis edema Skin-warm and dry Violaceous discoloration without warmth A & Oriented  Grossly normal sensory and motor function  Sinus rhythm at 85 Intervals 19/17/43 LBBB      Assessment and  Plan Ischemic cardiomyopathy  Congestive  heart failure-chronic-systolic  Ventricular tachycardia-monomorphic and polymorphic  Hypomagnesemia   Implantable defibrillator-St. Jude  LBBB Without symptoms of ischemia  Euvolemic continue current meds  I spoke with Dr. Reine Just. There is no problem intraventricular Galactic HF trial which is a calcium myosin binding protein in switching him from his ARB--Entresto   we will do that.  He has new left bundle branch block. This potentially presents an opportunity for cardiac resynchronization.  We will plan a repeat ultrasound to follow left ventricular function prior to any consideration of CRT upgrade.  My hope would be that there would be interval improvement on the Entresto  No intercurrent Ventricular tachycardia  Current medicines are reviewed at length with the patient today .  The patient does not  have concerns regarding medicines.

## 2016-07-03 ENCOUNTER — Encounter (HOSPITAL_COMMUNITY): Payer: Self-pay

## 2016-07-03 ENCOUNTER — Ambulatory Visit: Payer: Medicare Other | Admitting: Physician Assistant

## 2016-07-06 ENCOUNTER — Encounter (HOSPITAL_COMMUNITY): Payer: Self-pay

## 2016-07-06 ENCOUNTER — Encounter: Payer: Self-pay | Admitting: *Deleted

## 2016-07-06 DIAGNOSIS — Z006 Encounter for examination for normal comparison and control in clinical research program: Secondary | ICD-10-CM

## 2016-07-06 NOTE — Progress Notes (Signed)
RESEARCH ENCOUNTER  Patient ID: Leonard [name]  DOB: 1945/03/02  Leonard [name] presented to the [facility] for Week 4 visit of the [facility].  No signs/symptoms of ACS since the last visit. Participant states compliance with IP, IP returned and additional IP dispensed.    Blood pressure 134/83, pulse 93, resp. rate 15, weight 299 lb (135.6 kg), SpO2 97 %.   [facility].

## 2016-07-08 ENCOUNTER — Encounter (HOSPITAL_COMMUNITY): Payer: Self-pay

## 2016-07-09 ENCOUNTER — Other Ambulatory Visit: Payer: Self-pay | Admitting: Family Medicine

## 2016-07-10 ENCOUNTER — Encounter (HOSPITAL_COMMUNITY)
Admission: RE | Admit: 2016-07-10 | Discharge: 2016-07-10 | Disposition: A | Discharge status: Home / Self Care | Payer: Self-pay | Source: Ambulatory Visit | Attending: Cardiology | Admitting: Cardiology

## 2016-07-13 ENCOUNTER — Encounter (HOSPITAL_COMMUNITY): Payer: Self-pay

## 2016-07-14 ENCOUNTER — Telehealth (HOSPITAL_COMMUNITY): Payer: Self-pay

## 2016-07-14 ENCOUNTER — Ambulatory Visit (HOSPITAL_COMMUNITY)
Admission: RE | Admit: 2016-07-14 | Discharge: 2016-07-14 | Disposition: A | Discharge status: Home / Self Care | Payer: Medicare Other | Source: Ambulatory Visit | Attending: Internal Medicine | Admitting: Internal Medicine

## 2016-07-14 VITALS — BP 132/76 | HR 97 | Wt 300.6 lb

## 2016-07-14 DIAGNOSIS — Z955 Presence of coronary angioplasty implant and graft: Secondary | ICD-10-CM | POA: Diagnosis not present

## 2016-07-14 DIAGNOSIS — Z95810 Presence of automatic (implantable) cardiac defibrillator: Secondary | ICD-10-CM | POA: Diagnosis not present

## 2016-07-14 DIAGNOSIS — I255 Ischemic cardiomyopathy: Secondary | ICD-10-CM | POA: Diagnosis not present

## 2016-07-14 DIAGNOSIS — Z79899 Other long term (current) drug therapy: Secondary | ICD-10-CM | POA: Insufficient documentation

## 2016-07-14 DIAGNOSIS — Z7982 Long term (current) use of aspirin: Secondary | ICD-10-CM | POA: Diagnosis not present

## 2016-07-14 DIAGNOSIS — I5022 Chronic systolic (congestive) heart failure: Secondary | ICD-10-CM | POA: Diagnosis not present

## 2016-07-14 DIAGNOSIS — I130 Hypertensive heart and chronic kidney disease with heart failure and stage 1 through stage 4 chronic kidney disease, or unspecified chronic kidney disease: Secondary | ICD-10-CM | POA: Insufficient documentation

## 2016-07-14 DIAGNOSIS — F419 Anxiety disorder, unspecified: Secondary | ICD-10-CM | POA: Insufficient documentation

## 2016-07-14 DIAGNOSIS — I252 Old myocardial infarction: Secondary | ICD-10-CM | POA: Insufficient documentation

## 2016-07-14 DIAGNOSIS — I5043 Acute on chronic combined systolic (congestive) and diastolic (congestive) heart failure: Secondary | ICD-10-CM | POA: Diagnosis not present

## 2016-07-14 DIAGNOSIS — N183 Chronic kidney disease, stage 3 (moderate): Secondary | ICD-10-CM | POA: Diagnosis not present

## 2016-07-14 DIAGNOSIS — I5042 Chronic combined systolic (congestive) and diastolic (congestive) heart failure: Secondary | ICD-10-CM | POA: Diagnosis present

## 2016-07-14 DIAGNOSIS — I25119 Atherosclerotic heart disease of native coronary artery with unspecified angina pectoris: Secondary | ICD-10-CM | POA: Diagnosis not present

## 2016-07-14 DIAGNOSIS — I2510 Atherosclerotic heart disease of native coronary artery without angina pectoris: Secondary | ICD-10-CM | POA: Diagnosis not present

## 2016-07-14 DIAGNOSIS — E785 Hyperlipidemia, unspecified: Secondary | ICD-10-CM | POA: Diagnosis not present

## 2016-07-14 DIAGNOSIS — I10 Essential (primary) hypertension: Secondary | ICD-10-CM | POA: Diagnosis not present

## 2016-07-14 LAB — CBC
HCT: 47 % (ref 39.0–52.0)
Hemoglobin: 15.3 g/dL (ref 13.0–17.0)
MCH: 32.2 pg (ref 26.0–34.0)
MCHC: 32.6 g/dL (ref 30.0–36.0)
MCV: 98.9 fL (ref 78.0–100.0)
PLATELETS: 221 10*3/uL (ref 150–400)
RBC: 4.75 MIL/uL (ref 4.22–5.81)
RDW: 14 % (ref 11.5–15.5)
WBC: 11.9 10*3/uL — ABNORMAL HIGH (ref 4.0–10.5)

## 2016-07-14 LAB — BASIC METABOLIC PANEL
ANION GAP: 10 (ref 5–15)
BUN: 27 mg/dL — AB (ref 6–20)
CALCIUM: 9 mg/dL (ref 8.9–10.3)
CO2: 25 mmol/L (ref 22–32)
Chloride: 100 mmol/L — ABNORMAL LOW (ref 101–111)
Creatinine, Ser: 1.37 mg/dL — ABNORMAL HIGH (ref 0.61–1.24)
GFR calc Af Amer: 57 mL/min — ABNORMAL LOW (ref >60)
GFR, EST NON AFRICAN AMERICAN: 50 mL/min — AB (ref >60)
GLUCOSE: 133 mg/dL — AB (ref 65–99)
Potassium: 4.6 mmol/L (ref 3.5–5.1)
SODIUM: 135 mmol/L (ref 135–145)

## 2016-07-14 LAB — BRAIN NATRIURETIC PEPTIDE: B NATRIURETIC PEPTIDE 5: 180 pg/mL — AB (ref 0.0–100.0)

## 2016-07-14 MED ORDER — SPIRONOLACTONE 25 MG PO TABS: 12.5000 mg | ORAL_TABLET | Freq: Every day | ORAL | 3 refills | Status: DC

## 2016-07-14 MED ORDER — DIGOXIN 125 MCG PO TABS: 0.1250 mg | ORAL_TABLET | Freq: Every day | ORAL | 3 refills | Status: DC

## 2016-07-14 NOTE — Progress Notes (Signed)
Advanced Heart Failure Medication Review by a Pharmacist  Does the patient  feel that his/her medications are working for him/her?  yes  Has the patient been experiencing any side effects to the medications prescribed?  no  Does the patient measure his/her own blood pressure or blood glucose at home?  no   Does the patient have any problems obtaining medications due to transportation or finances?   no  Understanding of regimen: good Understanding of indications: good Potential of compliance: good Patient understands to avoid NSAIDs. Patient understands to avoid decongestants.  Issues to address at subsequent visits: none.    Pharmacist comments: [name] is a pleasant 71 y/o male who presents with his medication list. He endorses good adherence to his medication regimen. Patient has no medication-related questions or concerns for me at this time.   Phillis Knack PharmD Candidate  Time with patient: 10 minutes Preparation and documentation time: 10 minutes Total time: 20 minutes

## 2016-07-14 NOTE — Progress Notes (Signed)
Advanced Heart Failure Clinic Note   Primary Care: [name], NP-C Primary Cardiologist: Dr. Ellyn Hack Primary HF: New  HPI:  Leonard [name] is a 71 y.o. 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   Most recently admitted 06/09/16 with DOE and orthopnea. Not on diuretics PTA. Recently lost his daughter, and has been grieving and drinking more and eating worse. Diuresed 7 lbs. Cath with elevated filling pressures, low cardiac output (? In setting of fluid overload, and patent stents with otherwise non-obstructive CAD. Pt enrolled in Frederic study. Started on lasix.   Seen by Dr. Caryl Comes 07/01/16 and transitioned to [facility].   Pt presents today for post-hospital follow up and to establish with HF clinic. He has been feeling well overall since hospitalization. Walked from end of parking lot into clinic without SOB. Weight at home stable ~297. (Previously up to 312). Does not smoke. Still having occasional ETOH, has a scotch every 2-3 days. No SOB in [employer]/grocery store.  Gets tired before he gets SOB. Denies CP. Denies orthopnea or bendopnea. Denies lightheadedness or dizziness. 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  ICD interrogation personally reviewed, Thoracic impedence near threshold, No VT/VF. VP < 1%.  Echo 06/09/16 20-25%, Grade 2 DD, Trivial AI, Mild MR, Mod LAE, PA peak pressure 53 mm Hg.  [facility] 06/10/16   [place]amus lesion, 40 %stenosed.  Ost RCA to Prox RCA lesion, 10 %stenosed.  Mid RCA lesion, 0 %stenosed.  Dist RCA lesion, 10 %stenosed.  Ost LM to LM lesion, 30 %stenosed.  Ost LAD lesion, 40 %stenosed.  LV end diastolic pressure is moderately elevated.  Hemodynamic findings consistent with mild pulmonary hypertension.   1.  Nonobstructive CAD. Continued patency of stents in the RCA 2. Elevated LVEDP 3. Mild pulmonary HTN. 4. Reduced cardiac output. 3.89 L/min with index of 1.5  RHC Procedural Findings: Hemodynamics (mmHg) RA mean 16 mm Hg RV 42/11 mm Hg PA 43/23 (31) mm Hg  PCWP 23 mm Hg AO 104/72 mm Hg Cardiac Output (Fick) 3.89  Cardiac Index (Fick) 1.54  Review of systems complete and found to be negative unless listed in HPI.    Past Medical History:  Diagnosis Date  . AICD (automatic cardioverter/defibrillator) present 08/26/2015  . Anxiety   . Atherosclerotic heart disease of native coronary artery with angina pectoris ([place])    a. 07/2015 Posterior STEMI/PCI: LM nl, LAd 40ost, RI 40, RCA 153m (3.0x22 Resolute Integrity DES distal, 3.0x12 Resolute Integrity DES prox), 90d (PTCA), RPDA small, nl, RPLB2 100 - too small for PTCA. -- patent in f/u cath 08/2015  . CAD (coronary artery disease) 08/12/2015  . Chronic combined systolic and diastolic CHF (congestive heart failure) ([place])    a. 07/2015 Ehco: EF 45-50%, basal-mid inf and infsept AK, inflat, apical inf, and apical septal HK, Gr1 DD, triv MR.  [name] Dyspnea   . Hyperlipidemia with target low density lipoprotein (LDL) cholesterol less than 70 mg/dL   . Hypertensive heart disease   . Ischemic cardiomyopathy    a. 07/2015 Echo: EF 45-50%.; Follow-up Echo 08/23/2015: EF 50-55% with mild inferior hypokinesis.  . Kidney disease, chronic, stage [name] (GFR 30-59 ml/min) 08/12/2015  . Morbid obesity ([place])   . ST elevation myocardial infarction (STEMI) of inferior wall ([place]) 08/12/2015  Subacute presentation for inferior MI with post infarction angina on the following day. EKG still shows injury current. Therefore decided to proceed with intervention of 100% mRCA thrombotic lesion.  . Sustained ventricular fibrillation ([place]) 08/22/2015   Underwent EP evaluation with final ICD implantation.    Current Outpatient Prescriptions  Medication Sig Dispense Refill    . aspirin EC 81 MG tablet Take 1 tablet (81 mg total) by mouth daily.    [name] atorvastatin (LIPITOR) 80 MG tablet Take 1 tablet (80 mg total) by mouth daily at 6 PM. 30 tablet 12  . Cholecalciferol (VITAMIN D) 2000 units CAPS Take 1 capsule by mouth daily.    . Coenzyme Q10 (COQ10) 200 MG CAPS Take 200 mg by mouth daily.    . furosemide (LASIX) 40 MG tablet Take 1 tablet (40 mg total) by mouth daily. 30 tablet 6  . Investigational - Study Medication Take 1 tablet by mouth 2 (two) times daily. Study name: Galactic HF Study Additional study details: Omecamtiv Mecarbil or Placebo 14 each PRN  . magnesium oxide (MAG-OX) 400 MG tablet Take 1 tablet (400 mg total) by mouth 2 (two) times daily. 60 tablet 6  . metoprolol succinate (TOPROL-XL) 25 MG 24 hr tablet Take 25 mg by mouth daily.  6  . Multiple Vitamin (MULTIVITAMIN) tablet Take 1 tablet by mouth daily.    . nitroGLYCERIN (NITROSTAT) 0.4 MG SL tablet Place 1 tablet (0.4 mg total) under the tongue every 5 (five) minutes as needed for chest pain. 25 tablet 2  . Omega-3 Fatty Acids (FISH OIL) 1200 MG CPDR Take 1 tablet by mouth daily.    . sacubitril-valsartan (ENTRESTO) 49-51 MG Take 1 tablet by mouth 2 (two) times daily. 60 tablet 0  . ticagrelor (BRILINTA) 90 MG TABS tablet Take 1 tablet (90 mg total) by mouth 2 (two) times daily. 60 tablet 12  . venlafaxine (EFFEXOR) 75 MG tablet TAKE 1 TABLET BY MOUTH EVERY DAY 30 tablet 1   No current facility-administered medications for this encounter.     Allergies  Allergen Reactions  . Other Itching    Ragweed causes watery eyes, itching, sneezing  . Sulfa Antibiotics Other (See Comments)    Pt unsure of reaction, mother told him he was allergic      Social History   Social History  . Marital status: Married    Spouse name: N/A  . Number of children: N/A  . Years of education: N/A   Occupational History  . Not on file.   Social History Main Topics  . Smoking status: Never Smoker  .  Smokeless tobacco: Never Used  . Alcohol use 1.8 - 2.4 oz/week    3 - 4 Shots of liquor per week     Comment: 3-4 glasses of scotch 3-4 days per week  . Drug use: No  . Sexual activity: Not on file   Other Topics Concern  . Not on file   Social History Narrative   He tells me that his daughter died on Halloween 05/11/2015. She had a diving accident at the age of 45 and been quadriplegic for 25 years. She had been incredibly vigorous girl. He showed me her high school graduation picture.She was as he reported beautiful.  Her name was [name].   He is still quite upset     Family History  Problem Relation Age of Onset  . Diabetes Mother   . Heart disease Paternal Grandmother   . Heart disease Paternal Grandfather  Vitals:   07/14/16 0856  BP: 132/76  Pulse: 97  SpO2: 100%  Weight: (!) 300 lb 9.6 oz (136.4 kg)   Wt Readings from Last 3 Encounters:  07/14/16 (!) 300 lb 9.6 oz (136.4 kg)  07/06/16 299 lb (135.6 kg)  07/01/16 (!) 301 lb (136.5 kg)    PHYSICAL EXAM: General:  Well appearing. No respiratory difficulty HEENT: Normal Neck: Supple. JVP 6-7 Carotids 2+ bilat; no bruits. No lymphadenopathy or thyromegaly appreciated. Cor: PMI nondisplaced. Regular, slightly tachy. No rubs, gallops or murmurs. No S3 noted. Lungs: Clear Abdomen: Obese, soft, nontender, nondistended. No hepatosplenomegaly. No bruits or masses. Good bowel sounds. Extremities: No cyanosis, clubbing, or rash. Trace edema. Neuro: Alert & oriented x 3, cranial nerves grossly intact. moves all 4 extremities w/o difficulty. Affect pleasant.  ASSESSMENT & PLAN:  1. Chronic combined CHF - Echo 06/09/16 20-25%, Grade 2 DD, Trivial AI, Mild MR, Mod LAE, PA peak pressure 53 mm Hg. - NYHA II-[name] at present, but overall trajectory concerning with low cardiac index on recent cath (though in setting of volume overload at the time). - Volume status stable on exam with recent adjustments to medical regimen.  - Continue  Toprol XL 25 mg daily - Continue lasix 40 mg daily - Continue entresto 49/51 mg BID - Add spiro 12.5 mg daily. Check BMET 10 days.  - Add digoxin 0.125 mg daily - Reinforced fluid restriction to < 2 L daily, sodium restriction to less than 2000 mg daily, and the importance of daily weights.   2. CAD s/p posterior STEMI/PCI with two overlapping DES to RCA 07/2015 - Stable, Non-obstructive CAD on cath 05/2016 - No chest pain.  - Cont ASA, statin, and Ticagrelor.  3. HTN - Meds as above. Will adjust meds in setting of treating his HF 4. HLD - Per Dr. Ellyn Hack 5. Morbid Obesity - Needs to lose significant amount of weight.  RTC with pharmacist 3-4 weeks for Med titration. See MD in 6-8 weeks.  Will order CPX to fully assess functional capacity.   Shirley Friar, PA-C 07/14/16   Patient seen and examined with the above-signed Advanced Practice Provider and/or Housestaff. I personally reviewed laboratory data, imaging studies and relevant notes. I independently examined the patient and formulated the important aspects of the plan. I have edited the note to reflect any of my changes or salient points. I have personally discussed the plan with the patient and/or family.  Much improved since recent hospitalization but suspect overall likely NYHA [name]. Agree with med changes as above. Will proceed with CPX testing to objectively quantify functional capacity. We briefly discussed possibility of need for advanced therapies - he would like to avoid, if at all possible.   Glori Bickers, MD  11:55 PM

## 2016-07-14 NOTE — Telephone Encounter (Signed)
CHF Clinic appointment reminder call placed to patient for upcoming post-hospital follow up.  Does understand purpose of this appointment and where CHF Clinic is located? Patient confirms apt day, time, location, and reason for visit.  How is patient feeling? Well, no complaints  Does patient have all of their medications since their recent discharge? Yes  Patient also reminded to take all medications as prescribed on the day of his/her appointment and to bring all medications to this appointment.  Advised to call our office for tardiness or cancellations/rescheduling needs.  Leory Plowman, Guinevere Ferrari

## 2016-07-14 NOTE — Patient Instructions (Signed)
Routine lab work today. Will notify you of abnormal results, otherwise no news is good news!  Will schedule you for Cardiopulmonary Exercise Test. This test is done at our [facility]. Please wear comfortable clothes and shoes for this test. Avoid heavy meal before the test (light snack/meal recommended). Avoid caffeine, alcohol, tobacco products 12 hrs before test. Please give 24 hr notice for cancellations/rescheduling: [phone].  START Spironolactone 12.5 mg (1/2 tablet) once daily.  START Digoxin 0.125 mg tablet once daily.  Follow up in 3 weeks with Ileene Patrick CHF clinical pharmacist. Take all medication as prescribed the day of your appointment. Bring all medications with you to your appointment.  Follow up 6-8 weeks with Dr. Haroldine Laws. Take all medication as prescribed the day of your appointment. Bring all medications with you to your appointment.  Do the following things EVERYDAY: 1) Weigh yourself in the morning before breakfast. Write it down and keep it in a log. 2) Take your medicines as prescribed 3) Eat low salt foods-Limit salt (sodium) to 2000 mg per day.  4) Stay as active as you can everyday 5) Limit all fluids for the day to less than 2 liters

## 2016-07-15 ENCOUNTER — Encounter (HOSPITAL_COMMUNITY)
Admission: RE | Admit: 2016-07-15 | Discharge: 2016-07-15 | Disposition: A | Discharge status: Home / Self Care | Payer: Self-pay | Source: Ambulatory Visit | Attending: Cardiology | Admitting: Cardiology

## 2016-07-15 ENCOUNTER — Other Ambulatory Visit: Payer: Self-pay | Admitting: Cardiology

## 2016-07-15 DIAGNOSIS — I472 Ventricular tachycardia, unspecified: Secondary | ICD-10-CM

## 2016-07-17 ENCOUNTER — Encounter (HOSPITAL_COMMUNITY): Payer: Self-pay

## 2016-07-17 NOTE — Telephone Encounter (Signed)
Rx(s) sent to pharmacy electronically.  

## 2016-07-20 ENCOUNTER — Encounter (HOSPITAL_COMMUNITY): Payer: Self-pay

## 2016-07-20 DIAGNOSIS — I10 Essential (primary) hypertension: Secondary | ICD-10-CM | POA: Insufficient documentation

## 2016-07-20 DIAGNOSIS — I255 Ischemic cardiomyopathy: Secondary | ICD-10-CM | POA: Insufficient documentation

## 2016-07-20 DIAGNOSIS — I2129 ST elevation (STEMI) myocardial infarction involving other sites: Secondary | ICD-10-CM | POA: Insufficient documentation

## 2016-07-23 ENCOUNTER — Ambulatory Visit (HOSPITAL_COMMUNITY)
Admission: RE | Admit: 2016-07-23 | Discharge: 2016-07-23 | Disposition: A | Discharge status: Home / Self Care | Payer: Medicare Other | Source: Ambulatory Visit | Attending: Internal Medicine | Admitting: Internal Medicine

## 2016-07-23 ENCOUNTER — Encounter: Payer: Self-pay | Admitting: *Deleted

## 2016-07-23 DIAGNOSIS — Z006 Encounter for examination for normal comparison and control in clinical research program: Secondary | ICD-10-CM

## 2016-07-23 DIAGNOSIS — I5042 Chronic combined systolic (congestive) and diastolic (congestive) heart failure: Secondary | ICD-10-CM | POA: Diagnosis not present

## 2016-07-23 LAB — BASIC METABOLIC PANEL
ANION GAP: 10 (ref 5–15)
BUN: 22 mg/dL — ABNORMAL HIGH (ref 6–20)
CO2: 27 mmol/L (ref 22–32)
Calcium: 9.2 mg/dL (ref 8.9–10.3)
Chloride: 102 mmol/L (ref 101–111)
Creatinine, Ser: 1.33 mg/dL — ABNORMAL HIGH (ref 0.61–1.24)
GFR calc Af Amer: 60 mL/min — ABNORMAL LOW (ref >60)
GFR, EST NON AFRICAN AMERICAN: 51 mL/min — AB (ref >60)
GLUCOSE: 125 mg/dL — AB (ref 65–99)
POTASSIUM: 4.9 mmol/L (ref 3.5–5.1)
Sodium: 139 mmol/L (ref 135–145)

## 2016-07-23 NOTE — Progress Notes (Signed)
RESEARCH ENCOUNTER  Patient ID: Leonard [name]  DOB: January 24, 1945  Leonard [name] presented to the [facility] for Week 6 visit of the [facility].  No signs/symptoms of ACS since the last visit. Participant compliant with IP.  Blood pressure 124/79, pulse 88, weight 298 lb 9.6 oz (135.4 kg), SpO2 99 %.  Patient will [facility] with [facility] in 2 weeks.

## 2016-07-24 ENCOUNTER — Telehealth (HOSPITAL_COMMUNITY): Payer: Self-pay | Admitting: Cardiology

## 2016-07-24 ENCOUNTER — Encounter (HOSPITAL_COMMUNITY)
Admission: RE | Admit: 2016-07-24 | Discharge: 2016-07-24 | Disposition: A | Discharge status: Home / Self Care | Payer: Self-pay | Source: Ambulatory Visit | Attending: Cardiology | Admitting: Cardiology

## 2016-07-24 DIAGNOSIS — I509 Heart failure, unspecified: Secondary | ICD-10-CM

## 2016-07-24 NOTE — Telephone Encounter (Signed)
-----   Message from Shirley Friar, PA-C sent at 07/23/2016 10:58 AM EDT ----- OK. K borderline High.   Repeat labs 2 weeks. ( Keep Pharm appt as well)   Legrand Como "Oda Kilts, PA-C 07/23/2016 10:58 AM

## 2016-07-24 NOTE — Telephone Encounter (Signed)
Patient aware. Via detailed message on VM recheck labs at time of CPX 7/16...cmj

## 2016-07-27 ENCOUNTER — Encounter (HOSPITAL_COMMUNITY): Payer: Self-pay

## 2016-07-28 NOTE — Telephone Encounter (Signed)
Pt called back to let us know he had received VM, we discussed high K foods.  He states he is unable to have CPX at this time and would like to cancel, he states he will call back to reschedule and he will have his repeat labs done 7/20 while here for his research appt, lab appt rescheduled

## 2016-07-29 ENCOUNTER — Encounter (HOSPITAL_COMMUNITY): Payer: Self-pay

## 2016-07-31 ENCOUNTER — Encounter (HOSPITAL_COMMUNITY): Payer: Self-pay

## 2016-08-03 ENCOUNTER — Encounter (HOSPITAL_COMMUNITY): Payer: Medicare Other

## 2016-08-03 ENCOUNTER — Other Ambulatory Visit (HOSPITAL_COMMUNITY): Payer: Medicare Other

## 2016-08-03 ENCOUNTER — Encounter (HOSPITAL_COMMUNITY): Payer: Self-pay

## 2016-08-05 ENCOUNTER — Encounter (HOSPITAL_COMMUNITY): Payer: Self-pay

## 2016-08-05 ENCOUNTER — Telehealth: Payer: Self-pay

## 2016-08-05 NOTE — Telephone Encounter (Signed)
Pt was called and appt was made for next Tuesday with [place].

## 2016-08-05 NOTE — Telephone Encounter (Signed)
I have done a Entresto PA through covermymeds. Awaiting response.

## 2016-08-05 NOTE — Telephone Encounter (Addendum)
Received a message through covermymeds stating that the [facility] PA has been approved. I called [facility] pharmacy and they verified that the PA has been approved. Approval good from 08/05/16 until 08/05/17. The pt is aware.

## 2016-08-07 ENCOUNTER — Telehealth: Payer: Self-pay | Admitting: *Deleted

## 2016-08-07 ENCOUNTER — Encounter (HOSPITAL_COMMUNITY): Payer: Self-pay

## 2016-08-07 ENCOUNTER — Encounter: Payer: Self-pay | Admitting: *Deleted

## 2016-08-07 ENCOUNTER — Other Ambulatory Visit (HOSPITAL_COMMUNITY): Payer: Medicare Other

## 2016-08-07 DIAGNOSIS — Z006 Encounter for examination for normal comparison and control in clinical research program: Secondary | ICD-10-CM

## 2016-08-07 NOTE — Progress Notes (Signed)
RESEARCH ENCOUNTER  Patient ID: Leonard [name]  DOB: 16-May-1945  Leonard [name] presented to the [facility] for 8 visit of the [facility].  No signs/symptoms of ACS since the last visit. Participant compliant with IP, IP returned and additional IP dispensed.    Blood pressure 135/81 (right arm) & 116/78 (left arm), pulse 82, resp. rate 14, weight 298 lb 9.6 oz (135.4 kg), SpO2 100 %.  Patient will follow [facility] with [facility] in 4 weeks.

## 2016-08-07 NOTE — Telephone Encounter (Signed)
Spoke with patient about hyperkalemia education materials left for him at cardiac rehab.  Materials left in his folder.  Patient stated that he has not been at rehab since early July.  He was on vacation for a week and then recently lost his aunt, which is also his last connection to his Father, whom he was very close with.  Provided pt emotional support.  Patient stated that he will pick up the education materials when he returns to rehab on Monday.

## 2016-08-10 ENCOUNTER — Encounter (HOSPITAL_COMMUNITY)
Admission: RE | Admit: 2016-08-10 | Discharge: 2016-08-10 | Disposition: A | Discharge status: Home / Self Care | Payer: Self-pay | Source: Ambulatory Visit | Attending: Cardiology | Admitting: Cardiology

## 2016-08-12 ENCOUNTER — Encounter (HOSPITAL_COMMUNITY)
Admission: RE | Admit: 2016-08-12 | Discharge: 2016-08-12 | Disposition: A | Discharge status: Home / Self Care | Payer: Self-pay | Source: Ambulatory Visit | Attending: Cardiology | Admitting: Cardiology

## 2016-08-14 ENCOUNTER — Encounter (HOSPITAL_COMMUNITY): Payer: Self-pay

## 2016-08-17 ENCOUNTER — Encounter (HOSPITAL_COMMUNITY)
Admission: RE | Admit: 2016-08-17 | Discharge: 2016-08-17 | Disposition: A | Discharge status: Home / Self Care | Payer: Self-pay | Source: Ambulatory Visit | Attending: Cardiology | Admitting: Cardiology

## 2016-08-17 ENCOUNTER — Ambulatory Visit (HOSPITAL_COMMUNITY): Payer: Medicare Other

## 2016-08-18 ENCOUNTER — Other Ambulatory Visit: Payer: Self-pay

## 2016-08-19 ENCOUNTER — Encounter (HOSPITAL_COMMUNITY): Payer: Self-pay

## 2016-08-19 DIAGNOSIS — I255 Ischemic cardiomyopathy: Secondary | ICD-10-CM | POA: Insufficient documentation

## 2016-08-19 DIAGNOSIS — I2129 ST elevation (STEMI) myocardial infarction involving other sites: Secondary | ICD-10-CM | POA: Insufficient documentation

## 2016-08-19 DIAGNOSIS — I10 Essential (primary) hypertension: Secondary | ICD-10-CM | POA: Insufficient documentation

## 2016-08-21 ENCOUNTER — Encounter (HOSPITAL_COMMUNITY)
Admission: RE | Admit: 2016-08-21 | Discharge: 2016-08-21 | Disposition: A | Discharge status: Home / Self Care | Payer: Self-pay | Source: Ambulatory Visit | Attending: Cardiology | Admitting: Cardiology

## 2016-08-24 ENCOUNTER — Encounter (HOSPITAL_COMMUNITY): Payer: Self-pay

## 2016-08-26 ENCOUNTER — Encounter (HOSPITAL_COMMUNITY)
Admission: RE | Admit: 2016-08-26 | Discharge: 2016-08-26 | Disposition: A | Discharge status: Home / Self Care | Payer: Self-pay | Source: Ambulatory Visit | Attending: Cardiology | Admitting: Cardiology

## 2016-08-28 ENCOUNTER — Encounter (HOSPITAL_COMMUNITY): Payer: Self-pay

## 2016-08-31 ENCOUNTER — Encounter (HOSPITAL_COMMUNITY)
Admission: RE | Admit: 2016-08-31 | Discharge: 2016-08-31 | Disposition: A | Discharge status: Home / Self Care | Payer: Self-pay | Source: Ambulatory Visit | Attending: Cardiology | Admitting: Cardiology

## 2016-08-31 ENCOUNTER — Other Ambulatory Visit: Payer: Self-pay

## 2016-08-31 MED ORDER — SACUBITRIL-VALSARTAN 49-51 MG PO TABS: 1.0000 | ORAL_TABLET | Freq: Two times a day (BID) | ORAL | 6 refills | Status: DC

## 2016-09-02 ENCOUNTER — Encounter (HOSPITAL_COMMUNITY)
Admission: RE | Admit: 2016-09-02 | Discharge: 2016-09-02 | Disposition: A | Discharge status: Home / Self Care | Payer: Self-pay | Source: Ambulatory Visit | Attending: Cardiology | Admitting: Cardiology

## 2016-09-02 ENCOUNTER — Other Ambulatory Visit: Payer: Self-pay | Admitting: *Deleted

## 2016-09-02 DIAGNOSIS — Z006 Encounter for examination for normal comparison and control in clinical research program: Secondary | ICD-10-CM

## 2016-09-03 ENCOUNTER — Other Ambulatory Visit: Payer: Self-pay | Admitting: Family Medicine

## 2016-09-03 MED ORDER — ATORVASTATIN CALCIUM 80 MG PO TABS: 80.0000 mg | ORAL_TABLET | Freq: Every day | ORAL | 12 refills | Status: DC

## 2016-09-03 MED ORDER — TICAGRELOR 90 MG PO TABS: 90.0000 mg | ORAL_TABLET | Freq: Two times a day (BID) | ORAL | 12 refills | Status: DC

## 2016-09-03 NOTE — Telephone Encounter (Signed)
REFILL 

## 2016-09-03 NOTE — Telephone Encounter (Signed)
Is this okay to refill? 

## 2016-09-03 NOTE — Telephone Encounter (Signed)
ok 

## 2016-09-04 ENCOUNTER — Encounter (HOSPITAL_COMMUNITY): Payer: Self-pay

## 2016-09-04 ENCOUNTER — Other Ambulatory Visit: Payer: Self-pay

## 2016-09-04 MED ORDER — SACUBITRIL-VALSARTAN 49-51 MG PO TABS: 1.0000 | ORAL_TABLET | Freq: Two times a day (BID) | ORAL | 1 refills | Status: DC

## 2016-09-07 ENCOUNTER — Encounter (HOSPITAL_COMMUNITY)
Admission: RE | Admit: 2016-09-07 | Discharge: 2016-09-07 | Disposition: A | Discharge status: Home / Self Care | Payer: Self-pay | Source: Ambulatory Visit | Attending: Cardiology | Admitting: Cardiology

## 2016-09-07 ENCOUNTER — Telehealth: Payer: Self-pay | Admitting: Internal Medicine

## 2016-09-07 NOTE — Telephone Encounter (Signed)
New Message:    Pt is there,she wants to speak to a nurse now,she want to know to know if pt needs to be pre medicated

## 2016-09-07 NOTE — Telephone Encounter (Signed)
Discussed with DOD Harrington Challenger), per Dr. Harrington Challenger, patient does not require pre-med tx for dental procedures. Renee at Dr. Jackson Latino office informed.

## 2016-09-07 NOTE — Telephone Encounter (Signed)
Renee with Dr. Severiano Gilbert office to see if patient need pre-med tx for dental procedures. Patient is there now for a perio-maintenance cleaning at 9:00 am.  Phone # [phone] Fax # [phone]

## 2016-09-09 ENCOUNTER — Encounter (HOSPITAL_COMMUNITY)
Admission: RE | Admit: 2016-09-09 | Discharge: 2016-09-09 | Disposition: A | Discharge status: Home / Self Care | Payer: Self-pay | Source: Ambulatory Visit | Attending: Cardiology | Admitting: Cardiology

## 2016-09-10 ENCOUNTER — Telehealth: Payer: Self-pay | Admitting: Internal Medicine

## 2016-09-10 NOTE — Telephone Encounter (Signed)
Leonard [name]- have you had any luck with any patient assistance for entresto/ or used the [employer] foundation?

## 2016-09-10 NOTE — Telephone Encounter (Signed)
Patient wife [name]) calling, states that patient's last refill for [facility] cost him a little of $400 and [name] needs some help with what to do next.

## 2016-09-11 ENCOUNTER — Encounter (HOSPITAL_COMMUNITY)
Admission: RE | Admit: 2016-09-11 | Discharge: 2016-09-11 | Disposition: A | Discharge status: Home / Self Care | Payer: Self-pay | Source: Ambulatory Visit | Attending: Cardiology | Admitting: Cardiology

## 2016-09-11 ENCOUNTER — Encounter (HOSPITAL_COMMUNITY): Payer: Medicare Other | Admitting: Internal Medicine

## 2016-09-11 NOTE — Telephone Encounter (Addendum)
**Note De-Identified  Obfuscation** Leonard [name] is advised that per her request I will mail them a pt assistance application to fill out for Entresto. She verbalized understanding and thanked me for my assistance.  Entresto application has been mailed to the pts address.

## 2016-09-14 ENCOUNTER — Encounter (HOSPITAL_COMMUNITY): Payer: Self-pay

## 2016-09-14 ENCOUNTER — Other Ambulatory Visit: Payer: Self-pay | Admitting: Cardiology

## 2016-09-14 NOTE — Telephone Encounter (Signed)
Rx request sent to pharmacy.  

## 2016-09-16 ENCOUNTER — Encounter (HOSPITAL_COMMUNITY)
Admission: RE | Admit: 2016-09-16 | Discharge: 2016-09-16 | Disposition: A | Discharge status: Home / Self Care | Payer: Medicare Other | Source: Ambulatory Visit | Attending: Cardiology | Admitting: Cardiology

## 2016-09-17 ENCOUNTER — Ambulatory Visit: Payer: Medicare Other | Admitting: Cardiology

## 2016-09-18 ENCOUNTER — Encounter (HOSPITAL_COMMUNITY): Payer: Self-pay

## 2016-09-23 ENCOUNTER — Encounter (HOSPITAL_COMMUNITY): Payer: Self-pay

## 2016-09-23 DIAGNOSIS — I255 Ischemic cardiomyopathy: Secondary | ICD-10-CM | POA: Insufficient documentation

## 2016-09-23 DIAGNOSIS — I2129 ST elevation (STEMI) myocardial infarction involving other sites: Secondary | ICD-10-CM | POA: Insufficient documentation

## 2016-09-23 DIAGNOSIS — I10 Essential (primary) hypertension: Secondary | ICD-10-CM | POA: Insufficient documentation

## 2016-09-24 ENCOUNTER — Telehealth: Payer: Self-pay | Admitting: *Deleted

## 2016-09-24 NOTE — Telephone Encounter (Signed)
Received fax from [facility], it states they have received completed enrollment form for assistance with patients ENTRESTO.

## 2016-09-25 ENCOUNTER — Encounter (HOSPITAL_COMMUNITY): Payer: Self-pay

## 2016-09-28 ENCOUNTER — Encounter (HOSPITAL_COMMUNITY)
Admission: RE | Admit: 2016-09-28 | Discharge: 2016-09-28 | Disposition: A | Discharge status: Home / Self Care | Payer: Self-pay | Source: Ambulatory Visit | Attending: Cardiology | Admitting: Cardiology

## 2016-09-28 ENCOUNTER — Telehealth: Payer: Self-pay

## 2016-09-28 NOTE — Telephone Encounter (Addendum)
Prior auth for [employer] 49-51 submitted to [facility], as required by [employer]. Delene Loll approved by [facility].

## 2016-09-29 ENCOUNTER — Other Ambulatory Visit (HOSPITAL_COMMUNITY): Payer: Medicare Other

## 2016-09-30 ENCOUNTER — Telehealth: Payer: Self-pay | Admitting: Family Medicine

## 2016-09-30 ENCOUNTER — Encounter (HOSPITAL_COMMUNITY)
Admission: RE | Admit: 2016-09-30 | Discharge: 2016-09-30 | Disposition: A | Discharge status: Home / Self Care | Payer: Self-pay | Source: Ambulatory Visit | Attending: Cardiology | Admitting: Cardiology

## 2016-09-30 NOTE — Telephone Encounter (Signed)
Due for prevnar 13, and ok to have flu. Left message for pt to call back and schedule nurse visit for those shots

## 2016-09-30 NOTE — Telephone Encounter (Signed)
Pt would like to get pneumonia & flu vaccines. Is this ok?

## 2016-09-30 NOTE — Telephone Encounter (Signed)
Yes. It looks like he needs Prevnar 13. Please confirm.

## 2016-10-01 ENCOUNTER — Ambulatory Visit: Payer: Medicare Other | Admitting: Cardiology

## 2016-10-02 ENCOUNTER — Encounter (HOSPITAL_COMMUNITY)
Admission: RE | Admit: 2016-10-02 | Discharge: 2016-10-02 | Disposition: A | Discharge status: Home / Self Care | Payer: Self-pay | Source: Ambulatory Visit | Attending: Cardiology | Admitting: Cardiology

## 2016-10-05 ENCOUNTER — Encounter (HOSPITAL_COMMUNITY): Payer: Self-pay

## 2016-10-06 ENCOUNTER — Other Ambulatory Visit (INDEPENDENT_AMBULATORY_CARE_PROVIDER_SITE_OTHER): Payer: Medicare Other

## 2016-10-06 ENCOUNTER — Telehealth: Payer: Self-pay | Admitting: *Deleted

## 2016-10-06 DIAGNOSIS — Z23 Encounter for immunization: Secondary | ICD-10-CM

## 2016-10-06 NOTE — Telephone Encounter (Signed)
Patient was in this morning for some immunizations (HD Flu and Prevnar 13). He told me that he had some periodontal work done yesterday and wanted me to check with you to see if you could call in some abx for him, that in the past he has always needed this prior to dental/periodontal work. He also told me that he checked with his cardiologist, Dr.Klein but he told him that he didn't need any. He wanted me to still check with you, I told him I would. Please advise, thanks.

## 2016-10-06 NOTE — Telephone Encounter (Signed)
IM in agreement with the cardiologist

## 2016-10-06 NOTE — Telephone Encounter (Signed)
Pt made aware of recommendations /RLB

## 2016-10-07 ENCOUNTER — Encounter (HOSPITAL_COMMUNITY)
Admission: RE | Admit: 2016-10-07 | Discharge: 2016-10-07 | Disposition: A | Discharge status: Home / Self Care | Payer: Self-pay | Source: Ambulatory Visit | Attending: Cardiology | Admitting: Cardiology

## 2016-10-09 ENCOUNTER — Encounter (HOSPITAL_COMMUNITY)
Admission: RE | Admit: 2016-10-09 | Discharge: 2016-10-09 | Disposition: A | Discharge status: Home / Self Care | Payer: Self-pay | Source: Ambulatory Visit | Attending: Cardiology | Admitting: Cardiology

## 2016-10-12 ENCOUNTER — Encounter (HOSPITAL_COMMUNITY): Payer: Self-pay

## 2016-10-14 ENCOUNTER — Encounter (HOSPITAL_COMMUNITY)
Admission: RE | Admit: 2016-10-14 | Discharge: 2016-10-14 | Disposition: A | Discharge status: Home / Self Care | Payer: Self-pay | Source: Ambulatory Visit | Attending: Cardiology | Admitting: Cardiology

## 2016-10-14 NOTE — Telephone Encounter (Signed)
This has been done and was approved.

## 2016-10-14 NOTE — Telephone Encounter (Signed)
New message   Patience assistance program  [employer] calling for rn and she said that she needs prior auth for [facility] 49-51mg 

## 2016-10-16 ENCOUNTER — Encounter: Payer: Medicare Other | Admitting: Internal Medicine

## 2016-10-16 ENCOUNTER — Encounter (HOSPITAL_COMMUNITY)
Admission: RE | Admit: 2016-10-16 | Discharge: 2016-10-16 | Disposition: A | Discharge status: Home / Self Care | Payer: Self-pay | Source: Ambulatory Visit | Attending: Cardiology | Admitting: Cardiology

## 2016-10-19 ENCOUNTER — Encounter (HOSPITAL_COMMUNITY)
Admission: RE | Admit: 2016-10-19 | Discharge: 2016-10-19 | Disposition: A | Discharge status: Home / Self Care | Payer: Self-pay | Source: Ambulatory Visit | Attending: Cardiology | Admitting: Cardiology

## 2016-10-19 DIAGNOSIS — I2129 ST elevation (STEMI) myocardial infarction involving other sites: Secondary | ICD-10-CM | POA: Insufficient documentation

## 2016-10-19 DIAGNOSIS — I10 Essential (primary) hypertension: Secondary | ICD-10-CM | POA: Insufficient documentation

## 2016-10-19 DIAGNOSIS — I255 Ischemic cardiomyopathy: Secondary | ICD-10-CM | POA: Insufficient documentation

## 2016-10-21 ENCOUNTER — Encounter (HOSPITAL_COMMUNITY)
Admission: RE | Admit: 2016-10-21 | Discharge: 2016-10-21 | Disposition: A | Discharge status: Home / Self Care | Payer: Self-pay | Source: Ambulatory Visit | Attending: Cardiology | Admitting: Cardiology

## 2016-10-22 ENCOUNTER — Ambulatory Visit: Payer: Medicare Other | Admitting: Cardiology

## 2016-10-23 ENCOUNTER — Encounter (HOSPITAL_COMMUNITY)
Admission: RE | Admit: 2016-10-23 | Discharge: 2016-10-23 | Disposition: A | Discharge status: Home / Self Care | Payer: Self-pay | Source: Ambulatory Visit | Attending: Cardiology | Admitting: Cardiology

## 2016-10-26 ENCOUNTER — Encounter (HOSPITAL_COMMUNITY)
Admission: RE | Admit: 2016-10-26 | Discharge: 2016-10-26 | Disposition: A | Discharge status: Home / Self Care | Payer: Self-pay | Source: Ambulatory Visit | Attending: Cardiology | Admitting: Cardiology

## 2016-10-28 ENCOUNTER — Encounter (HOSPITAL_COMMUNITY)
Admission: RE | Admit: 2016-10-28 | Discharge: 2016-10-28 | Disposition: A | Discharge status: Home / Self Care | Payer: Self-pay | Source: Ambulatory Visit | Attending: Cardiology | Admitting: Cardiology

## 2016-10-28 DIAGNOSIS — D225 Melanocytic nevi of trunk: Secondary | ICD-10-CM | POA: Diagnosis not present

## 2016-10-28 DIAGNOSIS — L565 Disseminated superficial actinic porokeratosis (DSAP): Secondary | ICD-10-CM | POA: Diagnosis not present

## 2016-10-28 DIAGNOSIS — L720 Epidermal cyst: Secondary | ICD-10-CM | POA: Diagnosis not present

## 2016-10-28 DIAGNOSIS — L821 Other seborrheic keratosis: Secondary | ICD-10-CM | POA: Diagnosis not present

## 2016-10-28 DIAGNOSIS — Z85828 Personal history of other malignant neoplasm of skin: Secondary | ICD-10-CM | POA: Diagnosis not present

## 2016-10-28 DIAGNOSIS — C44519 Basal cell carcinoma of skin of other part of trunk: Secondary | ICD-10-CM | POA: Diagnosis not present

## 2016-10-28 DIAGNOSIS — L814 Other melanin hyperpigmentation: Secondary | ICD-10-CM | POA: Diagnosis not present

## 2016-10-29 ENCOUNTER — Other Ambulatory Visit (HOSPITAL_COMMUNITY): Payer: Medicare Other

## 2016-10-30 NOTE — Progress Notes (Signed)
RESEARCH ENCOUNTER  Patient ID: Leonard [name]  DOB: [birth year]  [name] presented to the [facility] for 12 visit of the [facility].  No signs/symptoms of ACS since the last visit. Participant compliant with IP, IP returned and additional IP dispensed.      [facility] will follow up with [facility] in 12 weeks.

## 2016-11-04 ENCOUNTER — Encounter (HOSPITAL_COMMUNITY)
Admission: RE | Admit: 2016-11-04 | Discharge: 2016-11-04 | Disposition: A | Discharge status: Home / Self Care | Payer: Self-pay | Source: Ambulatory Visit | Attending: Cardiology | Admitting: Cardiology

## 2016-11-11 ENCOUNTER — Encounter (HOSPITAL_COMMUNITY)
Admission: RE | Admit: 2016-11-11 | Discharge: 2016-11-11 | Disposition: A | Discharge status: Home / Self Care | Payer: Self-pay | Source: Ambulatory Visit | Attending: Cardiology | Admitting: Cardiology

## 2016-11-13 ENCOUNTER — Encounter (HOSPITAL_COMMUNITY)
Admission: RE | Admit: 2016-11-13 | Discharge: 2016-11-13 | Disposition: A | Discharge status: Home / Self Care | Payer: Self-pay | Source: Ambulatory Visit | Attending: Cardiology | Admitting: Cardiology

## 2016-11-16 ENCOUNTER — Encounter: Payer: Medicare Other | Admitting: Internal Medicine

## 2016-11-16 ENCOUNTER — Encounter (HOSPITAL_COMMUNITY)
Admission: RE | Admit: 2016-11-16 | Discharge: 2016-11-16 | Disposition: A | Discharge status: Home / Self Care | Payer: Self-pay | Source: Ambulatory Visit | Attending: Cardiology | Admitting: Cardiology

## 2016-11-18 ENCOUNTER — Encounter (HOSPITAL_COMMUNITY)
Admission: RE | Admit: 2016-11-18 | Discharge: 2016-11-18 | Disposition: A | Discharge status: Home / Self Care | Payer: Self-pay | Source: Ambulatory Visit | Attending: Cardiology | Admitting: Cardiology

## 2016-11-20 ENCOUNTER — Encounter (HOSPITAL_COMMUNITY): Payer: Self-pay

## 2016-11-23 ENCOUNTER — Encounter (HOSPITAL_COMMUNITY): Payer: Self-pay

## 2016-11-23 DIAGNOSIS — I2129 ST elevation (STEMI) myocardial infarction involving other sites: Secondary | ICD-10-CM | POA: Insufficient documentation

## 2016-11-23 DIAGNOSIS — I255 Ischemic cardiomyopathy: Secondary | ICD-10-CM | POA: Insufficient documentation

## 2016-11-23 DIAGNOSIS — I10 Essential (primary) hypertension: Secondary | ICD-10-CM | POA: Insufficient documentation

## 2016-11-25 ENCOUNTER — Encounter (HOSPITAL_COMMUNITY)
Admission: RE | Admit: 2016-11-25 | Discharge: 2016-11-25 | Disposition: A | Discharge status: Home / Self Care | Payer: Self-pay | Source: Ambulatory Visit | Attending: Cardiology | Admitting: Cardiology

## 2016-11-26 ENCOUNTER — Ambulatory Visit: Payer: Medicare Other | Admitting: Cardiology

## 2016-11-27 ENCOUNTER — Encounter: Payer: Self-pay | Admitting: *Deleted

## 2016-11-27 ENCOUNTER — Encounter (HOSPITAL_COMMUNITY)
Admission: RE | Admit: 2016-11-27 | Discharge: 2016-11-27 | Disposition: A | Discharge status: Home / Self Care | Payer: Self-pay | Source: Ambulatory Visit | Attending: Cardiology | Admitting: Cardiology

## 2016-11-27 DIAGNOSIS — Z006 Encounter for examination for normal comparison and control in clinical research program: Secondary | ICD-10-CM

## 2016-11-27 NOTE — Progress Notes (Addendum)
RESEARCH ENCOUNTER  Patient ID: Leonard [name]  DOB: 07-15-1945  Leonard [name] presented to the [facility] for the Week 24 Visit of the [facility].  No signs/symptoms of ACS since the last visit.   Additional IP dispensed.  Subject states he is compliant with IP.  One box of IP not returned ([id number]), and one box returned but missing metal pill sleeve ([id number]).  Subject states that he puts all the boxes in a drawer next to his dresser and thought he brought all to the appointment.  Re-educated to bring back all IP/boxes/pill sleeves to every research visit.  [name] will start taking IP dispensed today and will return pill sleeve and box to next appointment if he is able to locate.  In addition, discussed the importance of medication compliance with subject and reinforced taking IP BID.  Subject verbalized understanding.   Patient will follow up with [facility] in 12 weeks.

## 2016-11-30 ENCOUNTER — Telehealth: Payer: Self-pay | Admitting: *Deleted

## 2016-11-30 ENCOUNTER — Encounter (HOSPITAL_COMMUNITY): Payer: Self-pay

## 2016-11-30 ENCOUNTER — Other Ambulatory Visit: Payer: Self-pay | Admitting: Family Medicine

## 2016-11-30 NOTE — Telephone Encounter (Signed)
[name] called back to [place].  He was able to locate the pill sleeve and did not locate IP box.  He will bring back pill sleeve in January and will continue to look for the IP box.  In addition, [name] verbalized compliance stating he takes one pill in the morning and one at night.  Will follow up with subject in January.

## 2016-11-30 NOTE — Telephone Encounter (Signed)
ok 

## 2016-11-30 NOTE — Telephone Encounter (Signed)
Research Encounter  Left message for Leonard [name] to confirm if he was able to locate missing box and IP sleeve.  Also wanted to touch base after the weekend to discuss compliance again and verify taking IP BID.

## 2016-11-30 NOTE — Telephone Encounter (Signed)
I will refill for 3 months

## 2016-11-30 NOTE — Telephone Encounter (Signed)
Per last ov note in may you wanted him to come back for AWV. Called and spoke to patient and he states, he has too many cardiac appointments coming up that he would call back in January or February to schedule once he gets done with all his Cardiac appointments. Is it ok to refill this until [place] or February?

## 2016-12-01 ENCOUNTER — Other Ambulatory Visit (HOSPITAL_COMMUNITY): Payer: Medicare Other

## 2016-12-02 ENCOUNTER — Encounter (HOSPITAL_COMMUNITY)
Admission: RE | Admit: 2016-12-02 | Discharge: 2016-12-02 | Disposition: A | Discharge status: Home / Self Care | Payer: Self-pay | Source: Ambulatory Visit | Attending: Cardiology | Admitting: Cardiology

## 2016-12-03 ENCOUNTER — Ambulatory Visit: Payer: Medicare Other | Admitting: Cardiology

## 2016-12-04 ENCOUNTER — Encounter (HOSPITAL_COMMUNITY)
Admission: RE | Admit: 2016-12-04 | Discharge: 2016-12-04 | Disposition: A | Discharge status: Home / Self Care | Payer: Medicare Other | Source: Ambulatory Visit | Attending: Cardiology | Admitting: Cardiology

## 2016-12-07 ENCOUNTER — Encounter: Payer: Medicare Other | Admitting: Internal Medicine

## 2016-12-07 ENCOUNTER — Encounter (HOSPITAL_COMMUNITY): Payer: Self-pay

## 2016-12-09 ENCOUNTER — Encounter (HOSPITAL_COMMUNITY)
Admission: RE | Admit: 2016-12-09 | Discharge: 2016-12-09 | Disposition: A | Discharge status: Home / Self Care | Payer: Self-pay | Source: Ambulatory Visit | Attending: Cardiology | Admitting: Cardiology

## 2016-12-12 ENCOUNTER — Other Ambulatory Visit: Payer: Self-pay | Admitting: Internal Medicine

## 2016-12-14 ENCOUNTER — Encounter (HOSPITAL_COMMUNITY): Payer: Self-pay

## 2016-12-14 ENCOUNTER — Encounter: Payer: Self-pay | Admitting: Cardiology

## 2016-12-14 ENCOUNTER — Ambulatory Visit (INDEPENDENT_AMBULATORY_CARE_PROVIDER_SITE_OTHER): Payer: Medicare Other | Admitting: Cardiology

## 2016-12-14 VITALS — BP 120/70 | HR 94 | Ht 73.0 in | Wt 298.0 lb

## 2016-12-14 DIAGNOSIS — I209 Angina pectoris, unspecified: Secondary | ICD-10-CM | POA: Diagnosis not present

## 2016-12-14 DIAGNOSIS — I255 Ischemic cardiomyopathy: Secondary | ICD-10-CM

## 2016-12-14 DIAGNOSIS — I5042 Chronic combined systolic (congestive) and diastolic (congestive) heart failure: Secondary | ICD-10-CM

## 2016-12-14 DIAGNOSIS — Z8679 Personal history of other diseases of the circulatory system: Secondary | ICD-10-CM

## 2016-12-14 DIAGNOSIS — E785 Hyperlipidemia, unspecified: Secondary | ICD-10-CM

## 2016-12-14 DIAGNOSIS — I25119 Atherosclerotic heart disease of native coronary artery with unspecified angina pectoris: Secondary | ICD-10-CM | POA: Diagnosis not present

## 2016-12-14 DIAGNOSIS — I10 Essential (primary) hypertension: Secondary | ICD-10-CM | POA: Diagnosis not present

## 2016-12-14 MED ORDER — METOPROLOL SUCCINATE ER 25 MG PO TB24: 25.0000 mg | ORAL_TABLET | Freq: Every day | ORAL | 6 refills | Status: DC

## 2016-12-14 NOTE — Patient Instructions (Addendum)
Medication Instructions:  Your physician recommends that you continue on your current medications as directed. Please refer to the Current Medication list given to you today.  ---If you gain 3 lbs overnight, ok to take extra dose of furosemide (Lasix)  Labwork: Please return for FASTING labs (CMET,Lipid)-lab slips provided.   Testing/Procedures: Echo scheduled-12/10  Follow-Up: Your physician recommends that you schedule a follow-up appointment in: 3 months with Dr. Ellyn Hack.   Any Other Special Instructions Will Be Listed Below (If Applicable).     If you need a refill on your cardiac medications before your next appointment, please call your pharmacy.

## 2016-12-14 NOTE — Progress Notes (Signed)
PCP: Girtha Rm, NP-C  Clinic Note: Chief Complaint  Patient presents with  . Follow-up    No complaints  . Coronary Artery Disease    History of inferior STEMI with RCA PCI.  [name] Cardiomyopathy    Ischemic, but with new drop    HPI: Leonard [name] is a 71 y.o. male with a PMH below who presents today for 8 month f/u for CAD-inferior STEMI complicated by sustained VT actually noticed during follow-up visit.  He was cardioverted and then had ICD placed.[name] was last seen on March 19, 2016.  Was doing well from a cardiac standpoint.  Was upset as a result of his daughter's recent passing (she had been quadriplegic since age 35 surviving 25+ years) at that time he was on aspirin/Brilinta, atorvastatin 80 mg, 50 mg Toprol,  and 300 mg Avapro -> he was referred to cardiac rehab  Recent Hospitalizations:   Jun 09, 2016 admitted for heart failure - notably reduced LVEF & RWMA (see Echo), Cath with no new lesions.  Was (enrolled in the [facility]) discharged on ARB, Toprol and Lasix.   They noted that he had been drinking quite a bit of scotch whiskey since his daughter's death.   Follow-up with Dr. Caryl Comes June 13: Converted to [facility] .-Noted to have new left bundle branch block (considering possible resynchronization therapy - ) CRT-D  Seen by Dr. Haroldine Laws in [place] [facility] June 28: Continued on Toprol 25 mg, Entresto 49/51 mg twice daily, Lasix 40 mg daily.  Added spironolactone 12.5 mg daily along with digoxin 0.25 mg daily.   Na+ restriction < 2000 mg daily, fluid restriction < 2L daily, daily weights.  Never did follow-up --> for titration of medications.  Was actually seen by Pierre Bali for hospital follow-up.   --> Planned recheck Echo in December.  Studies Personally Reviewed - (if available, images/films reviewed: From Epic Chart or Care Everywhere)  Right and Left Heart Cath Jun 10, 2016; 1. Nonobstructive CAD. Continued patency of stents in the  RCA; Ramus lesion, 40 %stenosed. Ost RCA to Prox RCA lesion, 10 %stenosed.  Mid RCA lesion, 0 %stenosed.  Dist RCA lesion, 10 %stenosed. Ost LM to LM lesion, 30 %stenosed. Ost LAD lesion, 40 %stenosed.  2. Elevated LVEDP.   PCWP 23 mm Hg;[name] Mild pulmonary HTN. PA 43/23 (31) mm Hg  3. Reduced cardiac output. 3.89 L/min with index of 1.5.  Echo Jun 09, 2016 -EF  20-25%, GR 2 DD. Severe diffuse  hypokinesis with distinct regional wall motion abnormalities.   There is disproportionately severe hypokinesis and thinning of the apex, anterior septum and anterior wall (septal dyssynchrony). However, there is severe dyssynchrony , making it difficult to assess regional function.  PA P ~53 mmHg  Int Leonard [name] presents today for follow-up erval History: Leonard [name] presents here today for follow-up after having seen heart failure clinic and EP.  He never did follow-up to titrate his medications, but he tells me that he is feeling great.  He is pretty much try to get back to baseline.  He is now doing cardiac rehab, very active.  He is not yet jogging like he had done before but is as active as he can be.  He is really not noticing any significant exertional dyspnea.  No chest tightness or pressure.  No PND, orthopnea and well-controlled edema.  His weights have been stable -his weights are read about where they usually are at home and on his  scale some between 297 he indicates he has never had any -298 pounds.  Chest tightness or pressure with rest or exertion in the last few months significant like his MI pain.  He has not had any sensation of rapid irregular heartbeats palpitations.  None of the near syncope type symptoms that he had when he had the prolonged ventricular tachycardia post MI. No syncopal/near syncope or TIA/amaurosis fugax. No melena, hematochezia, hematuria, epistaxis or significant easy bruising. No claudication.  He has tried to cut back on his alcohol, but still drinks some.  No chest pain or  shortness of breath with rest or exertion. No PND, orthopnea or edema. No palpitations, lightheadedness, dizziness, weakness or syncope/near syncope. No TIA/amaurosis fugax symptoms. No melena, hematochezia, hematuria, or epstaxis. No claudication.  ROS: A comprehensive was performed. Review of Systems  Constitutional: Negative for chills, fever, malaise/fatigue and weight loss.  HENT: Negative for congestion.   Respiratory: Negative for cough and shortness of breath.   Musculoskeletal: Negative for falls, joint pain and myalgias.  Neurological: Negative for dizziness and weakness.  Psychiatric/Behavioral: Negative for depression (Mood seems to be much better) and memory loss. The patient does not have insomnia.   All other systems reviewed and are negative.  I have reviewed and (if needed) personally updated the patient's problem list, medications, allergies, past medical and surgical history, social and family history.   Past Medical History:  Diagnosis Date  . AICD (automatic cardioverter/defibrillator) present 08/26/2015  . Anxiety   . Atherosclerotic heart disease of native coronary artery with angina pectoris ([place]) 07/2015   a. 07/2015 Posterior STEMI/PCI: LM nl, LAd 40ost, RI 40, RCA 137m (3.0x22 Resolute Integrity DES distal, 3.0x12 Resolute Integrity DES prox), 90d (PTCA), RPDA small, nl, RPLB2 100 - too small for PTCA. -- patent in f/u cath 08/2015 & 05/2016 - patent RCA stents (~10%), RI 40%, LM ~30% ost LAD ~40%.  . Chronic combined systolic and diastolic CHF (congestive heart failure) ([place]) 07/2015   a. 07/2015 [facility]: EF 45-50%, basal-mid inf and infsept AK, inflat, apical inf, and apical septal HK, Gr1 DD, triv MR.;; b - Echo 05/2016 -> EF 20-25%, Gr 2 DD.  Severe diffuse HK disproportionately severe HK and thinning of apex, anterior septum and anterior wall-with septal dyssynchrony.  PAP ~53 mmHg.  [name] Hyperlipidemia with target low density lipoprotein (LDL) cholesterol less than 70  mg/dL   . Hypertensive heart disease   . Ischemic cardiomyopathy 07/2015 - 05/2016   a. 07/2015 Echo: EF 45-50%.; Follow-up Echo 08/23/2015: EF 50-55% with mild inferior hypokinesis --> 05/2016: EF 20-25%, diffuse HK. PAP ~53 mmHg.  . Kidney disease, chronic, stage [name] (GFR 30-59 ml/min) ([place]) 08/12/2015  . Morbid obesity ([place])   . ST elevation myocardial infarction (STEMI) of inferior wall ([place]) 08/12/2015   Subacute presentation for inferior MI with post infarction angina on the following day. EKG still shows injury current. Therefore decided to proceed with intervention of 100% mRCA thrombotic lesion.  . Sustained ventricular fibrillation ([place]) 08/22/2015   Underwent EP evaluation with final ICD implantation.    Past Surgical History:  Procedure Laterality Date  . CARDIAC CATHETERIZATION N/A 08/12/2015   Procedure: Left Heart Cath and Coronary Angiography;  Surgeon: Leonie Man, MD;  Location: [place] CV LAB;  Service: Cardiovascular: Inferior STEMI: 100% mid RCA, 90% distal RCA, 100% RPL. 40% ostial and proximal LAD and branch of ramus.  [name] CARDIAC CATHETERIZATION N/A 08/26/2015   Procedure: Left Heart Cath and Coronary Angiography;  Surgeon: Belva Crome, MD;  Location: [place] CV LAB;  Service: Cardiovascular: To evaluate sustained VT. Widely patent RCA stent and distal PTCA site. Also patent RPL branch. LAD lesion appeared to be more consistent with 50-55% and 40%. Otherwise stable from previous cath. EF estimated 35-45%.  [name] CARDIOVERSION N/A 08/23/2015   Procedure: CARDIOVERSION;  Surgeon: Deboraha Sprang, MD;  Location: [place];  Service: Cardiovascular;  Laterality: N/A;  . CORONARY ANGIOPLASTY WITH STENT PLACEMENT  08/12/2015   Mid RCA 100% reduced to 0% with 2 overlapping Resolute DES.  3.0 x 22 mm with a 3.0 x 12 mm prox overlarp (postdilated to 3.4 mm); PTCA of dRCA 90%. (Very dificult,complex case - tortuous, [place] RCA - unable to advance longer stents.  RPL2 noted to  have thromboembolic 580% occlusion - unable to reach.  . ELECTROPHYSIOLOGIC STUDY N/A 08/26/2015   Procedure: Electrophysiology Study;  Surgeon: Deboraha Sprang, MD;  Location: [place] CV LAB;  Service: Cardiovascular;  Laterality: N/A;  . EP IMPLANTABLE DEVICE N/A 08/26/2015   Procedure: ICD Implant;  Surgeon: Deboraha Sprang, MD;  Location: [place] CV LAB;  Service: Cardiovascular;  Laterality: LEFT:  St Jude ICD, serial number [id number].   [name] HEMORRHOID SURGERY    . RIGHT/LEFT HEART CATH AND CORONARY ANGIOGRAPHY N/A 06/10/2016   Procedure: Right/Left Heart Cath and Coronary Angiography;  Surgeon: Martinique, Peter M, MD;  Location: [facility] IN[facility]ASIVE [facility] LAB:  Nonobstructive CAD. RCA stents PATENT.  Ost-Prox RCA, 10 %. Ost LM 30 %. Ost LAD 40 %. RI, 40 %; High PCWP  & LVEDP (23 mmHg) ; PAP 43/23/31 mmHg. CO/CI 3.89 / 1/5 (Severely Reduced)  . SKIN SURGERY    . TRANSTHORACIC ECHOCARDIOGRAM  08/13/2015; 08/23/2015   a. Mild concentric LVH. EF 45-50%. Basal-mid inferior and inferoseptal akinesis with hypokinesis of inferolateral and apical inferior wall. 1 DD. ;; b. Technically difficult study. Definity contrast administered.  Compared to a prior echo in 07/2015, the LVEF is slightly higher at 50-55% with mild inferior hypokinesis.  . TRANSTHORACIC ECHOCARDIOGRAM  06/09/2016   EF  20-25%, GR 2 DD. Severe diffuse  hypokinesis with distinct regional wall motion abnormalities.   There is disproportionately severe hypokinesis and thinning of the apex, anterior septum and anterior wall (septal dyssynchrony). However, there is severe dyssynchrony , making it difficult to assess regional function.  PA P ~53 mmHg   Prior to Admission medications   Medication Sig Start Date End Date Taking? Authorizing Provider  aspirin EC 81 MG tablet Take 1 tablet (81 mg total) by mouth daily. 08/14/15   Arbutus Leas, NP  atorvastatin (LIPITOR) 80 MG tablet Take 1 tablet (80 mg total) by mouth daily at 6 PM. 09/03/16   Deboraha Sprang, MD    Cholecalciferol (VITAMIN D) 2000 units CAPS Take 1 capsule by mouth daily.    [provider]  Coenzyme Q10 (COQ10) 200 MG CAPS Take 200 mg by mouth daily.    [provider]  digoxin (LANOXIN) 0.125 MG tablet Take 1 tablet (0.125 mg total) by mouth daily. 07/14/16   Shirley Friar, PA-C  furosemide (LASIX) 40 MG tablet Take 1 tablet (40 mg total) by mouth daily. 06/12/16   Leanor Kail, PA  Investigational - Study Medication Take 1 tablet by mouth 2 (two) times daily. Study name: Galactic HF Study Additional study details: Omecamtiv Mecarbil or Placebo 06/25/16   Larey Dresser, MD  MAGNESIUM-OXIDE 400 (241.3 Mg) MG tablet TAKE 1  TABLET BY MOUTH TWICE DAILY. 07/17/16   Leonie Man, MD  metoprolol succinate (TOPROL-XL) 25 MG 24 hr tablet Take 25 mg by mouth daily. 05/17/16   [provider]  Multiple Vitamin (MULTIVITAMIN) tablet Take 1 tablet by mouth daily.    [provider]  nitroGLYCERIN (NITROSTAT) 0.4 MG SL tablet Place 1 tablet (0.4 mg total) under the tongue every 5 (five) minutes as needed for chest pain. 08/14/15   Arbutus Leas, NP  Omega-3 Fatty Acids (FISH OIL) 1200 MG CPDR Take 1 tablet by mouth daily.    [provider]  sacubitril-valsartan (ENTRESTO) 49-51 MG Take 1 tablet by mouth 2 (two) times daily. 09/04/16   Deboraha Sprang, MD  spironolactone (ALDACTONE) 25 MG tablet Take 0.5 tablets (12.5 mg total) by mouth daily. 07/14/16 10/12/16  Shirley Friar, PA-C  ticagrelor (BRILINTA) 90 MG TABS tablet Take 1 tablet (90 mg total) by mouth 2 (two) times daily. 09/03/16   Deboraha Sprang, MD  venlafaxine (EFFEXOR) 75 MG tablet TAKE 1 TABLET BY MOUTH EVERY DAY 11/30/16   Henson, Vickie L, NP-C    Allergies  Allergen Reactions  . Other Itching    Ragweed causes watery eyes, itching, sneezing  . Sulfa Antibiotics Other (See Comments)    Pt unsure of reaction, mother told him he was allergic     Social History    Socioeconomic History  . Marital status: Married    Spouse name: None  . Number of children: None  . Years of education: None  . Highest education level: None  Social Needs  . Financial resource strain: None  . Food insecurity - worry: None  . Food insecurity - inability: None  . Transportation needs - medical: None  . Transportation needs - non-medical: None  Occupational History  . None  Tobacco Use  . Smoking status: Never Smoker  . Smokeless tobacco: Never Used  Substance and Sexual Activity  . Alcohol use: Yes    Alcohol/week: 1.8 - 2.4 oz    Types: 3 - 4 Shots of liquor per week    Comment: 3-4 glasses of scotch 3-4 days per week  . Drug use: No  . Sexual activity: None  Other Topics Concern  . None  Social History Narrative   He tells me that his daughter died on Halloween 04/29/15. She had a diving accident at the age of 22 and been quadriplegic for 25 years. She had been incredibly vigorous girl. He showed me her high school graduation picture.She was as he reported beautiful.  Her name was [name].    family history includes Diabetes in his mother; Heart disease in his paternal grandfather and paternal grandmother.  Wt Readings from Last 3 Encounters:  12/14/16 298 lb (135.2 kg)  11/27/16 299 lb (135.6 kg)  09/02/16 298 lb (135.2 kg)    PHYSICAL EXAM BP 120/70   Pulse 94   Ht 6\' 1"  (1.854 m)   Wt 298 lb (135.2 kg)   SpO2 97%   BMI 39.32 kg/m  Physical Exam  Constitutional: He appears well-developed and well-nourished. No distress.  Remains borderline morbidly obese  HENT:  Head: Normocephalic and atraumatic.  Mouth/Throat: No oropharyngeal exudate.  Eyes: Conjunctivae and EOM are normal. No scleral icterus.  Neck: Normal range of motion. No hepatojugular reflux and no JVD (Roughly 6 cm water) present. Carotid bruit is not present.  Cardiovascular: Normal rate, regular rhythm and intact distal pulses.  No extrasystoles are present.  PMI is not displaced  (Unable to palpate). Exam reveals distant heart sounds. Exam reveals no gallop and no friction rub.  No murmur heard. Very difficult to hear heart sounds due to body habitus  Pulmonary/Chest: Effort normal and breath sounds normal. No respiratory distress. He has no wheezes. He has no rales.  Abdominal: Soft. Bowel sounds are normal. He exhibits no distension. There is no tenderness. There is no rebound.  Musculoskeletal: Normal range of motion. He exhibits no edema.  Mild venous stasis changes  Skin: Skin is warm and dry. No rash noted. No erythema.  Psychiatric: He has a normal mood and affect. His behavior is normal. Judgment and thought content normal.  Nursing note and vitals reviewed.   Adult ECG Report n/a  Other studies Reviewed: Additional studies/ records that were reviewed today include:  Recent Labs:   Lab Results  Component Value Date   CHOL 134 06/10/2016   HDL 34 (L) 06/10/2016   LDLCALC 68 06/10/2016   TRIG 160 (H) 06/10/2016   CHOLHDL 3.9 06/10/2016   Lab Results  Component Value Date   CREATININE 1.33 (H) 07/23/2016   BUN 22 (H) 07/23/2016   NA 139 07/23/2016   K 4.9 07/23/2016   CL 102 07/23/2016   CO2 27 07/23/2016   Lab Results  Component Value Date   WBC 11.9 (H) 07/14/2016   HGB 15.3 07/14/2016   HCT 47.0 07/14/2016   MCV 98.9 07/14/2016   PLT 221 07/14/2016    ASSESSMENT / PLAN: Problem List Items Addressed This Visit    Chronic combined systolic and diastolic CHF (congestive heart failure) (HCC) - Primary (Chronic)   Relevant Medications   metoprolol succinate (TOPROL-XL) 25 MG 24 hr tablet   Coronary artery disease involving native coronary artery with angina pectoris (HCC) (Chronic)    Pretty much euvolemic on exam today.  No edema, no significant JVD.  He has no PND or orthopnea symptoms. He is on a standard dose of Lasix 40 mg daily and we discussed sliding scale dosing for weight gain more than 3 pounds. He is maintaining sodium and  fluid restrictions as noted. He is now on significant medications including Entresto, Toprol along with digoxin and spironolactone.  He is also on the Galactic Heart Failure Study investigational medication  --> Could probably tolerate titration of medications, my consideration will be to increase Toprol first for better rate control, however he seems to be doing quite well and I would prefer to hold off on doing so. --> Will discuss with Dr. Leo Rod on determine if he would pursue for to titrate Entresto versus beta-blocker, versus spironolactone.  Has repeat 2D echo planned for December.      Relevant Medications   metoprolol succinate (TOPROL-XL) 25 MG 24 hr tablet   Essential hypertension (Chronic)    Relatively well controlled currently.  I suspect that we can probably gradually titrate up his Toprol and Entresto dose.      Relevant Medications   metoprolol succinate (TOPROL-XL) 25 MG 24 hr tablet   H/O ventricular tachycardia    No apparent repeat events by ICD interrogation      Hyperlipidemia LDL goal <70 (Chronic)    On high-dose atorvastatin.   LDL in May was well within goal.  Recheck labs after I see him back in 3 months.      Relevant Medications   metoprolol succinate (TOPROL-XL) 25 MG 24 hr tablet   Other Relevant Orders   Comprehensive metabolic panel  Lipid panel   Ischemic cardiomyopathy (Chronic)    Interestingly, his EF is almost improved back to borderline normal after his MI, but now he has had a significant drop that is not explained by his coronary anatomy.  I suspect it may be related to pacing and bundle branch block.  When I saw him in April he was not requiring any diuretic, but is now on Lasix and spironolactone.  He is on Toprol, Entresto and digoxin as well.   Will need to gradually titrate up his spironolactone, Entresto and Toprol.  Steady dose of Lasix with sliding scale      Relevant Medications   metoprolol succinate (TOPROL-XL) 25 MG 24  hr tablet   Morbid obesity (HCC) (Chronic)    He has lost some weight.  Hopefully as he expected his activity level with cardiac rehab, he will continue to lose weight. Talked about dietary modification and continue to increase activity level.         Current medicines are reviewed at length with the patient today. (+/- concerns) n/a The following changes have been made: n/a  Patient Instructions  Medication Instructions:  Your physician recommends that you continue on your current medications as directed. Please refer to the Current Medication list given to you today.  ---If you gain 3 lbs overnight, ok to take extra dose of furosemide (Lasix)  Labwork: Please return for FASTING labs (CMET,Lipid)-lab slips provided.   Testing/Procedures: Echo scheduled-12/10  Follow-Up: Your physician recommends that you schedule a follow-up appointment in: 3 months with Dr. Ellyn Hack.   Any Other Special Instructions Will Be Listed Below (If Applicable).     If you need a refill on your cardiac medications before your next appointment, please call your pharmacy.     Studies Ordered:   Orders Placed This Encounter  Procedures  . Comprehensive metabolic panel  . Lipid panel      Glenetta Hew, M.D., M.S. Interventional Cardiologist   Pager # [phone] Phone # [phone] [address] [place], [place] [address]

## 2016-12-14 NOTE — Telephone Encounter (Signed)
This is Dr. Harding's pt. °

## 2016-12-15 ENCOUNTER — Encounter: Payer: Self-pay | Admitting: Cardiology

## 2016-12-15 NOTE — Assessment & Plan Note (Signed)
Pretty much euvolemic on exam today.  No edema, no significant JVD.  He has no PND or orthopnea symptoms. He is on a standard dose of Lasix 40 mg daily and we discussed sliding scale dosing for weight gain more than 3 pounds. He is maintaining sodium and fluid restrictions as noted. He is now on significant medications including Entresto, Toprol along with digoxin and spironolactone.  He is also on the Galactic Heart Failure Study investigational medication  --> Could probably tolerate titration of medications, my consideration will be to increase Toprol first for better rate control, however he seems to be doing quite well and I would prefer to hold off on doing so. --> Will discuss with Dr. Leo Rod on determine if he would pursue for to titrate Entresto versus beta-blocker, versus spironolactone.  Has repeat 2D echo planned for December.

## 2016-12-15 NOTE — Assessment & Plan Note (Signed)
Relatively well controlled currently.  I suspect that we can probably gradually titrate up his Toprol and Entresto dose.

## 2016-12-15 NOTE — Assessment & Plan Note (Signed)
On high-dose atorvastatin.   LDL in May was well within goal.  Recheck labs after I see him back in 3 months.

## 2016-12-15 NOTE — Assessment & Plan Note (Signed)
He has lost some weight.  Hopefully as he expected his activity level with cardiac rehab, he will continue to lose weight. Talked about dietary modification and continue to increase activity level.

## 2016-12-15 NOTE — Assessment & Plan Note (Signed)
No apparent repeat events by ICD interrogation

## 2016-12-15 NOTE — Assessment & Plan Note (Signed)
Interestingly, his EF is almost improved back to borderline normal after his MI, but now he has had a significant drop that is not explained by his coronary anatomy.  I suspect it may be related to pacing and bundle branch block.  When I saw him in April he was not requiring any diuretic, but is now on Lasix and spironolactone.  He is on Toprol, Entresto and digoxin as well.   Will need to gradually titrate up his spironolactone, Entresto and Toprol.  Steady dose of Lasix with sliding scale

## 2016-12-16 ENCOUNTER — Encounter (HOSPITAL_COMMUNITY)
Admission: RE | Admit: 2016-12-16 | Discharge: 2016-12-16 | Disposition: A | Discharge status: Home / Self Care | Payer: Self-pay | Source: Ambulatory Visit | Attending: Cardiology | Admitting: Cardiology

## 2016-12-18 ENCOUNTER — Encounter (HOSPITAL_COMMUNITY)
Admission: RE | Admit: 2016-12-18 | Discharge: 2016-12-18 | Disposition: A | Discharge status: Home / Self Care | Payer: Self-pay | Source: Ambulatory Visit | Attending: Cardiology | Admitting: Cardiology

## 2016-12-21 ENCOUNTER — Encounter (HOSPITAL_COMMUNITY): Payer: Self-pay

## 2016-12-21 DIAGNOSIS — I2129 ST elevation (STEMI) myocardial infarction involving other sites: Secondary | ICD-10-CM | POA: Insufficient documentation

## 2016-12-21 DIAGNOSIS — I10 Essential (primary) hypertension: Secondary | ICD-10-CM | POA: Insufficient documentation

## 2016-12-21 DIAGNOSIS — I255 Ischemic cardiomyopathy: Secondary | ICD-10-CM | POA: Insufficient documentation

## 2016-12-23 ENCOUNTER — Encounter (HOSPITAL_COMMUNITY)
Admission: RE | Admit: 2016-12-23 | Discharge: 2016-12-23 | Disposition: A | Discharge status: Home / Self Care | Payer: Self-pay | Source: Ambulatory Visit | Attending: Cardiology | Admitting: Cardiology

## 2016-12-25 ENCOUNTER — Encounter (HOSPITAL_COMMUNITY)
Admission: RE | Admit: 2016-12-25 | Discharge: 2016-12-25 | Disposition: A | Discharge status: Home / Self Care | Payer: Medicare Other | Source: Ambulatory Visit | Attending: Cardiology | Admitting: Cardiology

## 2016-12-28 ENCOUNTER — Other Ambulatory Visit (HOSPITAL_COMMUNITY): Payer: Medicare Other

## 2016-12-28 ENCOUNTER — Other Ambulatory Visit: Payer: Medicare Other

## 2016-12-28 ENCOUNTER — Encounter (HOSPITAL_COMMUNITY): Payer: Self-pay

## 2016-12-30 ENCOUNTER — Encounter (HOSPITAL_COMMUNITY): Payer: Self-pay

## 2017-01-01 ENCOUNTER — Encounter (HOSPITAL_COMMUNITY): Payer: Self-pay

## 2017-01-04 ENCOUNTER — Encounter (HOSPITAL_COMMUNITY)
Admission: RE | Admit: 2017-01-04 | Discharge: 2017-01-04 | Disposition: A | Discharge status: Home / Self Care | Payer: Self-pay | Source: Ambulatory Visit | Attending: Cardiology | Admitting: Cardiology

## 2017-01-06 ENCOUNTER — Encounter (HOSPITAL_COMMUNITY)
Admission: RE | Admit: 2017-01-06 | Discharge: 2017-01-06 | Disposition: A | Discharge status: Home / Self Care | Payer: Medicare Other | Source: Ambulatory Visit | Attending: Cardiology | Admitting: Cardiology

## 2017-01-08 ENCOUNTER — Encounter (HOSPITAL_COMMUNITY): Payer: Self-pay

## 2017-01-13 ENCOUNTER — Encounter (HOSPITAL_COMMUNITY): Payer: Self-pay | Admitting: Radiology

## 2017-01-13 ENCOUNTER — Encounter (HOSPITAL_COMMUNITY): Payer: Self-pay

## 2017-01-14 ENCOUNTER — Telehealth: Payer: Self-pay | Admitting: Cardiology

## 2017-01-14 ENCOUNTER — Telehealth (HOSPITAL_COMMUNITY): Payer: Self-pay | Admitting: Internal Medicine

## 2017-01-14 MED ORDER — METOPROLOL SUCCINATE ER 25 MG PO TB24: 25.0000 mg | ORAL_TABLET | Freq: Every day | ORAL | 3 refills | Status: DC

## 2017-01-14 NOTE — Telephone Encounter (Signed)
12/25/16 lmom to call D. Miller 12/12 lmom to sched. echo evd 12/10 NOS echo 12/18 lmom to sched. echo evd 01/13/17 lmom to sched. echo and sent letter evd  Patient cxed appt on /911,10/11,11/13 and no-showed on 12/10..RG  He will be removed from the workqueue.

## 2017-01-14 NOTE — Telephone Encounter (Signed)
New Message     *STAT* If patient is at the pharmacy, call can be transferred to refill team.   1. Which medications need to be refilled? (please list name of each medication and dose if known) metoprolol succinate 25mg    2. Which pharmacy/location (including street and city if local pharmacy) is medication to be sent to? [place] [phone]   3. Do they need a 30 day or 90 day supply? 30   Patient is out took last one this morning.

## 2017-01-15 ENCOUNTER — Encounter (HOSPITAL_COMMUNITY): Payer: Self-pay

## 2017-01-15 ENCOUNTER — Telehealth: Payer: Self-pay | Admitting: Cardiology

## 2017-01-15 MED ORDER — FUROSEMIDE 40 MG PO TABS: 40.0000 mg | ORAL_TABLET | Freq: Every day | ORAL | 6 refills | Status: DC

## 2017-01-15 NOTE — Addendum Note (Signed)
Addended by: Leanord Asal T on: 01/15/2017 09:15 AM   Modules accepted: Orders

## 2017-01-15 NOTE — Telephone Encounter (Signed)
°*  STAT* If patient is at the pharmacy, call can be transferred to refill team.   1. Which medications need to be refilled? (please list name of each medication and dose if known) lasix 40mg   2. Which pharmacy/location (including street and city if local pharmacy) is medication to be sent to? [facility] on [address]. Do they need a 30 day or 90 day supply? [place]

## 2017-01-18 ENCOUNTER — Encounter (HOSPITAL_COMMUNITY): Payer: Self-pay

## 2017-01-19 HISTORY — PX: TRANSTHORACIC ECHOCARDIOGRAM: SHX275

## 2017-01-20 ENCOUNTER — Encounter (HOSPITAL_COMMUNITY): Payer: Self-pay

## 2017-01-20 DIAGNOSIS — I2129 ST elevation (STEMI) myocardial infarction involving other sites: Secondary | ICD-10-CM | POA: Insufficient documentation

## 2017-01-20 DIAGNOSIS — I255 Ischemic cardiomyopathy: Secondary | ICD-10-CM | POA: Insufficient documentation

## 2017-01-20 DIAGNOSIS — I10 Essential (primary) hypertension: Secondary | ICD-10-CM | POA: Insufficient documentation

## 2017-01-22 ENCOUNTER — Encounter (HOSPITAL_COMMUNITY): Payer: Self-pay

## 2017-01-22 ENCOUNTER — Encounter: Payer: Medicare Other | Admitting: Internal Medicine

## 2017-01-25 ENCOUNTER — Encounter (HOSPITAL_COMMUNITY)
Admission: RE | Admit: 2017-01-25 | Discharge: 2017-01-25 | Disposition: A | Discharge status: Home / Self Care | Payer: Self-pay | Source: Ambulatory Visit | Attending: Cardiology | Admitting: Cardiology

## 2017-01-27 ENCOUNTER — Encounter (HOSPITAL_COMMUNITY): Payer: Self-pay

## 2017-01-29 ENCOUNTER — Encounter (HOSPITAL_COMMUNITY)
Admission: RE | Admit: 2017-01-29 | Discharge: 2017-01-29 | Disposition: A | Discharge status: Home / Self Care | Payer: Medicare Other | Source: Ambulatory Visit | Attending: Cardiology | Admitting: Cardiology

## 2017-02-01 ENCOUNTER — Encounter (HOSPITAL_COMMUNITY)
Admission: RE | Admit: 2017-02-01 | Discharge: 2017-02-01 | Disposition: A | Discharge status: Home / Self Care | Payer: Self-pay | Source: Ambulatory Visit | Attending: Cardiology | Admitting: Cardiology

## 2017-02-03 ENCOUNTER — Encounter (HOSPITAL_COMMUNITY): Payer: Self-pay

## 2017-02-04 ENCOUNTER — Other Ambulatory Visit: Payer: Self-pay

## 2017-02-04 ENCOUNTER — Ambulatory Visit (HOSPITAL_COMMUNITY): Payer: Medicare Other | Attending: Cardiovascular Disease

## 2017-02-04 DIAGNOSIS — I252 Old myocardial infarction: Secondary | ICD-10-CM | POA: Diagnosis not present

## 2017-02-04 DIAGNOSIS — I2510 Atherosclerotic heart disease of native coronary artery without angina pectoris: Secondary | ICD-10-CM | POA: Diagnosis not present

## 2017-02-04 DIAGNOSIS — I255 Ischemic cardiomyopathy: Secondary | ICD-10-CM | POA: Insufficient documentation

## 2017-02-04 DIAGNOSIS — I110 Hypertensive heart disease with heart failure: Secondary | ICD-10-CM | POA: Diagnosis not present

## 2017-02-04 DIAGNOSIS — E785 Hyperlipidemia, unspecified: Secondary | ICD-10-CM | POA: Diagnosis not present

## 2017-02-04 DIAGNOSIS — I340 Nonrheumatic mitral (valve) insufficiency: Secondary | ICD-10-CM | POA: Insufficient documentation

## 2017-02-04 DIAGNOSIS — I5022 Chronic systolic (congestive) heart failure: Secondary | ICD-10-CM | POA: Insufficient documentation

## 2017-02-04 MED ORDER — PERFLUTREN LIPID MICROSPHERE
1.0000 mL | INTRAVENOUS | Status: AC | PRN
  Administered 2017-02-04: 2 mL via INTRAVENOUS

## 2017-02-05 ENCOUNTER — Encounter (HOSPITAL_COMMUNITY)
Admission: RE | Admit: 2017-02-05 | Discharge: 2017-02-05 | Disposition: A | Discharge status: Home / Self Care | Payer: Self-pay | Source: Ambulatory Visit | Attending: Cardiology | Admitting: Cardiology

## 2017-02-08 ENCOUNTER — Encounter (HOSPITAL_COMMUNITY): Payer: Self-pay

## 2017-02-10 ENCOUNTER — Encounter (HOSPITAL_COMMUNITY): Payer: Self-pay

## 2017-02-12 ENCOUNTER — Encounter (HOSPITAL_COMMUNITY): Payer: Self-pay

## 2017-02-12 ENCOUNTER — Telehealth: Payer: Self-pay | Admitting: Internal Medicine

## 2017-02-12 NOTE — Telephone Encounter (Signed)
Follow Up:    Returning your call,cocnerning his Echo results.

## 2017-02-15 ENCOUNTER — Encounter (HOSPITAL_COMMUNITY): Payer: Self-pay

## 2017-02-17 ENCOUNTER — Encounter (HOSPITAL_COMMUNITY)
Admission: RE | Admit: 2017-02-17 | Discharge: 2017-02-17 | Disposition: A | Discharge status: Home / Self Care | Payer: Self-pay | Source: Ambulatory Visit | Attending: Cardiology | Admitting: Cardiology

## 2017-02-18 NOTE — Telephone Encounter (Signed)
See note on Echo report

## 2017-02-19 ENCOUNTER — Encounter: Payer: Medicare Other | Admitting: *Deleted

## 2017-02-19 ENCOUNTER — Encounter (HOSPITAL_COMMUNITY)
Admission: RE | Admit: 2017-02-19 | Discharge: 2017-02-19 | Disposition: A | Discharge status: Home / Self Care | Payer: Self-pay | Source: Ambulatory Visit | Attending: Cardiology | Admitting: Cardiology

## 2017-02-19 VITALS — BP 134/76 | HR 77 | Wt 292.0 lb

## 2017-02-19 DIAGNOSIS — I10 Essential (primary) hypertension: Secondary | ICD-10-CM | POA: Insufficient documentation

## 2017-02-19 DIAGNOSIS — Z006 Encounter for examination for normal comparison and control in clinical research program: Secondary | ICD-10-CM

## 2017-02-19 DIAGNOSIS — I255 Ischemic cardiomyopathy: Secondary | ICD-10-CM | POA: Insufficient documentation

## 2017-02-19 DIAGNOSIS — I2129 ST elevation (STEMI) myocardial infarction involving other sites: Secondary | ICD-10-CM | POA: Insufficient documentation

## 2017-02-19 NOTE — Progress Notes (Signed)
RESEARCH ENCOUNTER  Patient ID: Leonard [name]  DOB: 1945/11/08  [name] presented to the [facility] for the [facility] Trial Week 36 visit.  No signs/symptoms of ACS since the last visit. Subject states he has had a cold over the past week and he did not take any OTC medications or ABX.    Subject returned IP.  Box [id number] dispensed during week 24 not returned; subject states that the box and the pill sleeve were thrown away while [place] the beach.  He states that there were no pills remaining when the sleeve was thrown away.  In addition, the subject did return the empty foil sleeve from box [id number] and the box/empty sleeve [id number] dispensed during the week 12 visit.    Again, I discussed the importance of compliance and the need to take one pill in the morning and one pill in the evening each day.  Subject 74% compliant.  Subject verbalized understanding.  [facility].

## 2017-02-22 ENCOUNTER — Encounter (HOSPITAL_COMMUNITY): Payer: Self-pay

## 2017-02-23 ENCOUNTER — Other Ambulatory Visit: Payer: Self-pay | Admitting: Family Medicine

## 2017-02-23 NOTE — Telephone Encounter (Signed)
ok 

## 2017-02-23 NOTE — Telephone Encounter (Signed)
Is this okay to refill? 

## 2017-02-24 ENCOUNTER — Encounter (HOSPITAL_COMMUNITY)
Admission: RE | Admit: 2017-02-24 | Discharge: 2017-02-24 | Disposition: A | Discharge status: Home / Self Care | Payer: Self-pay | Source: Ambulatory Visit | Attending: Cardiology | Admitting: Cardiology

## 2017-02-25 ENCOUNTER — Encounter: Payer: Medicare Other | Admitting: Internal Medicine

## 2017-02-26 ENCOUNTER — Encounter (HOSPITAL_COMMUNITY): Payer: Self-pay

## 2017-03-01 ENCOUNTER — Encounter (HOSPITAL_COMMUNITY)
Admission: RE | Admit: 2017-03-01 | Discharge: 2017-03-01 | Disposition: A | Discharge status: Home / Self Care | Payer: Medicare Other | Source: Ambulatory Visit | Attending: Cardiology | Admitting: Cardiology

## 2017-03-03 ENCOUNTER — Encounter (HOSPITAL_COMMUNITY)
Admission: RE | Admit: 2017-03-03 | Discharge: 2017-03-03 | Disposition: A | Discharge status: Home / Self Care | Payer: Self-pay | Source: Ambulatory Visit | Attending: Cardiology | Admitting: Cardiology

## 2017-03-05 ENCOUNTER — Encounter (HOSPITAL_COMMUNITY): Payer: Self-pay

## 2017-03-08 ENCOUNTER — Encounter (HOSPITAL_COMMUNITY)
Admission: RE | Admit: 2017-03-08 | Discharge: 2017-03-08 | Disposition: A | Discharge status: Home / Self Care | Payer: Self-pay | Source: Ambulatory Visit | Attending: Cardiology | Admitting: Cardiology

## 2017-03-09 ENCOUNTER — Other Ambulatory Visit: Payer: Self-pay | Admitting: Internal Medicine

## 2017-03-09 NOTE — Telephone Encounter (Signed)
Pt's medication of Entresto was taken off pt's medication list and I do not see where the Doctor D/C this medication. Pt's pharmacy is requesting a refill on this medication. Please clarify if pt is still suppose to be taking this medication. Please address

## 2017-03-10 ENCOUNTER — Encounter (HOSPITAL_COMMUNITY): Payer: Self-pay

## 2017-03-11 ENCOUNTER — Ambulatory Visit (INDEPENDENT_AMBULATORY_CARE_PROVIDER_SITE_OTHER): Payer: Medicare Other | Admitting: Internal Medicine

## 2017-03-11 ENCOUNTER — Encounter: Payer: Self-pay | Admitting: Internal Medicine

## 2017-03-11 ENCOUNTER — Encounter (INDEPENDENT_AMBULATORY_CARE_PROVIDER_SITE_OTHER): Payer: Self-pay

## 2017-03-11 VITALS — BP 128/82 | HR 97 | Ht 73.5 in | Wt 294.2 lb

## 2017-03-11 DIAGNOSIS — I5022 Chronic systolic (congestive) heart failure: Secondary | ICD-10-CM

## 2017-03-11 DIAGNOSIS — I447 Left bundle-branch block, unspecified: Secondary | ICD-10-CM

## 2017-03-11 DIAGNOSIS — I255 Ischemic cardiomyopathy: Secondary | ICD-10-CM

## 2017-03-11 DIAGNOSIS — Z95810 Presence of automatic (implantable) cardiac defibrillator: Secondary | ICD-10-CM

## 2017-03-11 DIAGNOSIS — Z79899 Other long term (current) drug therapy: Secondary | ICD-10-CM | POA: Diagnosis not present

## 2017-03-11 LAB — CUP PACEART INCLINIC DEVICE CHECK
Implantable Lead Implant Date: 20170807
Implantable Pulse Generator Implant Date: 20170807
MDC IDC LEAD LOCATION: 753860
MDC IDC SESS DTM: 20190221165304
Pulse Gen Serial Number: 7377761

## 2017-03-11 MED ORDER — SACUBITRIL-VALSARTAN 49-51 MG PO TABS: 1.0000 | ORAL_TABLET | Freq: Two times a day (BID) | ORAL | 11 refills | Status: DC

## 2017-03-11 NOTE — Progress Notes (Signed)
Patient Care Team: Girtha Rm, NP-C as PCP - General (Family Medicine)   HPI  Leonard [name] is a 72 y.o. male Seen in follow-up for ventricular tachycardia and ICD implantation for secondary prevention 8/17. He had presented in the context of ischemic heart disease prior PCI with sustained wide complex tachycardia and was cardioverted. EP testing confirmed ventricular tachycardia.  9/17   appropriate therapy for polymorphic ventricular tachycardia with some degree of long short coupling>>  beta blockers were increased.   Date Cr K Mg  9/17  0.93 4.4 1.5  5/18  1.41 4.9     DATE TEST    8/17  Cath   EF 35-40 % Widely patent right coronary stents,Ostial 50-60% LAD  5/18   Echo   EF 20-25 %   5/18  Cath  Findings unchanged   1/19 Echo EF 15-20%     He has been feeling better.  He has mild shortness of breath but is quite active.  He has had no peripheral edema.   He snores.  He has daytime somnolence.  His wife is observed apneic spells   Records and Results Reviewed As above he had gone to the heart failure clinic.  He found it overly negative  He tells me that his daughter died on Halloween 2017. She had a diving accident at the age of 70 and been quadriplegic for 25 years. She had been incredibly vigorous girl. He showed me her high school graduation picture.She was as he reported beautiful.  Her name was [name] Past Medical History:  Diagnosis Date  . AICD (automatic cardioverter/defibrillator) present 08/26/2015  . Anxiety   . Atherosclerotic heart disease of native coronary artery with angina pectoris ([place]) 07/2015   a. 07/2015 Posterior STEMI/PCI: LM nl, LAd 40ost, RI 40, RCA 172m (3.0x22 Resolute Integrity DES distal, 3.0x12 Resolute Integrity DES prox), 90d (PTCA), RPDA small, nl, RPLB2 100 - too small for PTCA. -- patent in f/u cath 08/2015 & 05/2016 - patent RCA stents (~10%), RI 40%, LM ~30% ost LAD ~40%.  . Chronic combined systolic and diastolic CHF  (congestive heart failure) ([place]) 07/2015   a. 07/2015 Ehco: EF 45-50%, basal-mid inf and infsept AK, inflat, apical inf, and apical septal HK, Gr1 DD, triv MR.;; b - Echo 05/2016 -> EF 20-25%, Gr 2 DD.  Severe diffuse HK disproportionately severe HK and thinning of apex, anterior septum and anterior wall-with septal dyssynchrony.  PAP ~53 mmHg.  [name] Hyperlipidemia with target low density lipoprotein (LDL) cholesterol less than 70 mg/dL   . Hypertensive heart disease   . Ischemic cardiomyopathy 07/2015 - 05/2016   a. 07/2015 Echo: EF 45-50%.; Follow-up Echo 08/23/2015: EF 50-55% with mild inferior hypokinesis --> 05/2016: EF 20-25%, diffuse HK. PAP ~53 mmHg.  . Kidney disease, chronic, stage [name] (GFR 30-59 ml/min) ([place]) 08/12/2015  . Morbid obesity ([place])   . ST elevation myocardial infarction (STEMI) of inferior wall ([place]) 08/12/2015   Subacute presentation for inferior MI with post infarction angina on the following day. EKG still shows injury current. Therefore decided to proceed with intervention of 100% mRCA thrombotic lesion.  . Sustained ventricular fibrillation ([place]) 08/22/2015   Underwent EP evaluation with final ICD implantation.    Past Surgical History:  Procedure Laterality Date  . CARDIAC CATHETERIZATION N/A 08/12/2015   Procedure: Left Heart Cath and Coronary Angiography;  Surgeon: Leonie Man, MD;  Location: [facility];  Service: Cardiovascular: Inferior STEMI: 100% mid  RCA, 90% distal RCA, 100% RPL. 40% ostial and proximal LAD and branch of ramus.  [name] CARDIAC CATHETERIZATION N/A 08/26/2015   Procedure: Left Heart Cath and Coronary Angiography;  Surgeon: Belva Crome, MD;  Location: [facility];  Service: Cardiovascular: To evaluate sustained VT. Widely patent RCA stent and distal PTCA site. Also patent RPL branch. LAD lesion appeared to be more consistent with 50-55% and 40%. Otherwise stable from previous cath. EF estimated 35-45%.  [name] CARDIOVERSION N/A 08/23/2015    Procedure: CARDIOVERSION;  Surgeon: Deboraha Sprang, MD;  Location: [facility];  Service: Cardiovascular;  Laterality: N/A;  . CORONARY ANGIOPLASTY WITH STENT PLACEMENT  08/12/2015   Mid RCA 100% reduced to 0% with 2 overlapping Resolute DES.  3.0 x 22 mm with a 3.0 x 12 mm prox overlarp (postdilated to 3.4 mm); PTCA of dRCA 90%. (Very dificult,complex case - tortuous, [place] RCA - unable to advance longer stents.  RPL2 noted to have thromboembolic 161% occlusion - unable to reach.  . ELECTROPHYSIOLOGIC STUDY N/A 08/26/2015   Procedure: Electrophysiology Study;  Surgeon: Deboraha Sprang, MD;  Location: [facility];  Service: Cardiovascular;  Laterality: N/A;  . EP IMPLANTABLE DEVICE N/A 08/26/2015   Procedure: ICD Implant;  Surgeon: Deboraha Sprang, MD;  Location: [facility];  Service: Cardiovascular;  Laterality: LEFT:  St Jude ICD, serial number [id number].   [name] HEMORRHOID SURGERY    . RIGHT/LEFT HEART CATH AND CORONARY ANGIOGRAPHY N/A 06/10/2016   Procedure: Right/Left Heart Cath and Coronary Angiography;  Surgeon: Martinique, Peter M, MD;  [facility] IN[facility]:  Nonobstructive CAD. RCA stents PATENT.  Ost-Prox RCA, 10 %. Ost LM 30 %. Ost LAD 40 %. RI, 40 %; High PCWP  & LVEDP (23 mmHg) ; PAP 43/23/31 mmHg. CO/CI 3.89 / 1/5 (Severely Reduced)  . SKIN SURGERY    . TRANSTHORACIC ECHOCARDIOGRAM  08/13/2015; 08/23/2015   a. Mild concentric LVH. EF 45-50%. Basal-mid inferior and inferoseptal akinesis with hypokinesis of inferolateral and apical inferior wall. 1 DD. ;; b. Technically difficult study. Definity contrast administered.  Compared to a prior echo in 07/2015, the LVEF is slightly higher at 50-55% with mild inferior hypokinesis.  . TRANSTHORACIC ECHOCARDIOGRAM  06/09/2016   EF  20-25%, GR 2 DD. Severe diffuse  hypokinesis with distinct regional wall motion abnormalities.   There is disproportionately severe hypokinesis and thinning of the apex, anterior septum and anterior wall (septal  dyssynchrony). However, there is severe dyssynchrony , making it difficult to assess regional function.  PA P ~53 mmHg    Current Outpatient Medications  Medication Sig Dispense Refill  . aspirin EC 81 MG tablet Take 1 tablet (81 mg total) by mouth daily.    [name] atorvastatin (LIPITOR) 80 MG tablet Take 1 tablet (80 mg total) by mouth daily at 6 PM. 30 tablet 12  . Cholecalciferol (VITAMIN D) 2000 units CAPS Take 1 capsule by mouth daily.    . Coenzyme Q10 (COQ10) 200 MG CAPS Take 200 mg by mouth daily.    . digoxin (LANOXIN) 0.125 MG tablet Take 1 tablet (0.125 mg total) by mouth daily. 90 tablet 3  . furosemide (LASIX) 40 MG tablet Take 1 tablet (40 mg total) by mouth daily. 30 tablet 6  . Investigational - Study Medication Take 1 tablet by mouth 2 (two) times daily. Study name: Galactic HF Study Additional study details: Omecamtiv Mecarbil or Placebo 14 each PRN  . MAGNESIUM-OXIDE 400 (241.3 Mg)  MG tablet TAKE 1 TABLET BY MOUTH TWICE DAILY. 60 tablet 8  . metoprolol succinate (TOPROL-XL) 25 MG 24 hr tablet Take 1 tablet (25 mg total) by mouth daily. 30 tablet 3  . Multiple Vitamin (MULTIVITAMIN) tablet Take 1 tablet by mouth daily.    . nitroGLYCERIN (NITROSTAT) 0.4 MG SL tablet Place 1 tablet (0.4 mg total) under the tongue every 5 (five) minutes as needed for chest pain. 25 tablet 2  . Omega-3 Fatty Acids (FISH OIL) 1200 MG CPDR Take 1 tablet by mouth daily.    . sacubitril-valsartan (ENTRESTO) 49-51 MG Take 1 tablet by mouth 2 (two) times daily. 60 tablet 11  . ticagrelor (BRILINTA) 90 MG TABS tablet Take 1 tablet (90 mg total) by mouth 2 (two) times daily. 60 tablet 12  . venlafaxine (EFFEXOR) 75 MG tablet TAKE 1 TABLET BY MOUTH EVERY DAY 30 tablet 2  . spironolactone (ALDACTONE) 25 MG tablet Take 0.5 tablets (12.5 mg total) by mouth daily. 45 tablet 3   No current facility-administered medications for this visit.     Allergies  Allergen Reactions  . Other Itching    Ragweed causes  watery eyes, itching, sneezing  . Sulfa Antibiotics Other (See Comments)    Pt unsure of reaction, mother told him he was allergic       Review of Systems negative except from HPI and PMH  Physical Exam BP 128/82   Pulse 97   Ht 6' 1.5" (1.867 m)   Wt 294 lb 4 oz (133.5 kg)   BMI 38.30 kg/m  Well developed and Morbidly obese in no acute distress HENT normal Neck supple with JVP-flat Clear Regular rate and rhythm, no murmurs or gallops Abd-soft with active BS No Clubbing cyanosis edema Skin-warm and dry A & Oriented  Grossly normal sensory and motor function    Sinus rhythm at 100 19/16/41 LBBB -60      Assessment and  Plan Ischemic cardiomyopathy  Congestive heart failure-chronic-systolic  Ventricular tachycardia-monomorphic and polymorphic  Hypomagnesemia   Implantable defibrillator-St. Jude  LBBB  Sinus tachycardia   Sleep disordered breathing and daytime somnolence    The patient needs a sleep study.  He is agreeable.  His wife is enthusiastic.    His LV dysfunction has continued to worsen.  We discussed the physiology of left bundle branch block and the data regarding improvement and the decrease in the likelihood of the development of worsening heart failure symptoms.    His sinus tachycardia is interesting.  His mean heart rate on device interrogation is in the 70s and 80s.  According to his wife he is exceedingly anxious about being here.  He is currently on excellent medical therapy  We will plan CRT upgrade.  Have reviewed the potential benefits and risks of ICD implantation including but not limited to death, perforation of heart or lung, lead dislodgement, infection,  device malfunction and inappropriate shocks.  The patient and family express understanding  and are willing to proceed.     No intercurrent ventricular tachycardia  More than 50% of 40 min was spent in counseling related to the above

## 2017-03-11 NOTE — Patient Instructions (Addendum)
Medication Instructions:  Your physician recommends that you continue on your current medications as directed. Please refer to the Current Medication list given to you today.  Labwork: Your physician recommends that you have labs today: BMP  Testing/Procedures:  Your physician has recommended that you have a sleep study. This test records several body functions during sleep, including: brain activity, eye movement, oxygen and carbon dioxide blood levels, heart rate and rhythm, breathing rate and rhythm, the flow of air through your mouth and nose, snoring, body muscle movements, and chest and belly movement.   Follow-Up:  Your physician recommends that you schedule a follow-up appointment in: One Year with Dr Caryl Comes  Any Other Special Instructions Will Be Listed Below (If Applicable).   Biventricular Pacemaker Implantation A biventricular pacemaker implantation is a procedure to place (implant) a pacemaker into both of the lower chambers (ventricles) of the heart. A pacemaker is a small, battery-powered device that helps control the heartbeat. If the heart beats irregularly or too slowly (bradycardia), the pacemaker will pace the heart so that it beats at a normal rate or a programmed rate. The parts of a biventricular pacemaker include:  The pulse generator. The pulse generator contains a small computer and a memory system that is programmed to keep the heart beating at a certain rate. The pulse generator also produces the electrical signal that triggers the heart to beat. This is implanted under the skin of the upper chest, near the collarbone.  Wires (leads). The leads are placed in the left and right ventricles of the heart. The leads are connected to the pulse generator. They transmit electrical pulses from the pulse generator to the heart.  This procedure may be done to treat:  Bradycardia.  Symptoms of severe heart failure, such as shortness of breath (dyspnea).  Loss of  consciousness that happens repeatedly (syncope) because of an irregular heart rate.  Tell a health care provider about:  Any allergies you have.  All medicines you are taking, including vitamins, herbs, eye drops, creams, and over-the-counter medicines.  Any problems you or family members have had with anesthetic medicines.  Any blood disorders you have.  Any surgeries you have had.  Any medical conditions you have.  Whether you are pregnant or may be pregnant. What are the risks? Generally, this is a safe procedure. However, problems may occur, including:  Infection.  Bleeding.  Allergic reactions to medicines or dyes.  Damage to other structures or organs, such as your blood vessels, lungs, or heart.  Failure of the pacemaker to improve your condition.  What happens before the procedure?  Ask your health care provider about: ? Changing or stopping your regular medicines. This is especially important if you are taking diabetes medicines or blood thinners. ? Taking medicines such as aspirin and ibuprofen. These medicines can thin your blood. Do not take these medicines before your procedure if your health care provider instructs you not to.  Follow instructions from your health care provider about eating or drinking restrictions.  Do not use any tobacco products for at least 24 hours before your procedure. This includes cigarettes, chewing tobacco, or e-cigarettes.  Ask your health care provider how your surgical site will be marked or identified.  You may be given antibiotic medicine to help prevent infection.  You may have tests, including: ? Blood tests. ? Chest X-rays.  Plan to have someone take you home after the procedure.  If you go home right after the procedure, plan to have  someone with you for 24 hours. What happens during the procedure?  To reduce your risk of infection: ? Your health care team will wash or sanitize their hands. ? Your skin will be  washed with soap. ? Hair may be removed from your surgical area.  An IV tube will be inserted into one of your veins.  You will be given one or more of the following: ? A medicine to help you relax (sedative). ? A medicine to make you fall asleep (general anesthetic). ? A medicine that is injected into your spine to numb the area below and slightly above the injection site (spinal anesthetic). ? A medicine that is injected into an area of your body to numb everything below the injection site (regional anesthetic).  An incision will be made in your upper chest, near your heart.  The leads will be guided into your incision, through your blood vessels, and into your ventricles. Your surgeon will use an X-ray machine (fluoroscope) to guide the leads into your heart.  The leads will be attached to your heart muscles and to the pulse generator.  The leads will be tested to make sure that they work correctly.  The pulse generator will be implanted under your skin, near your incision.  Your incision will be closed with stitches (sutures), skin glue, or adhesive tape.  A bandage (dressing) will be placed over your incision. The procedure may vary among health care providers and hospitals. What happens after the procedure?  Your blood pressure, heart rate, breathing rate, and blood oxygen level will be monitored often until the medicines you were given have worn off.  You may continue to receive fluids and medicines through an IV tube.  You will have some pain. Pain medicines will be available to help you.  You will have a chest X-ray done. This is to make sure that your pacemaker is in the right place.  You may have to wear compression stockings. These stockings help to prevent blood clots and reduce swelling in your legs.  You will be given a pacemaker identification card. This card lists the implant date, device model, and manufacturer of your pacemaker.  Do not drive for 24 hours if  you received a sedative. This information is not intended to replace advice given to you by your health care provider. Make sure you discuss any questions you have with your health care provider. Document Released: 09/30/2011 Document Revised: 06/13/2015 Document Reviewed: 09/30/2014 Elsevier Interactive Patient Education  [employer].      If you need a refill on your cardiac medications before your next appointment, please call your pharmacy.

## 2017-03-12 ENCOUNTER — Encounter (HOSPITAL_COMMUNITY): Payer: Self-pay

## 2017-03-12 LAB — BASIC METABOLIC PANEL
BUN/Creatinine Ratio: 15 (ref 10–24)
BUN: 18 mg/dL (ref 8–27)
CO2: 24 mmol/L (ref 20–29)
CREATININE: 1.21 mg/dL (ref 0.76–1.27)
Calcium: 9.4 mg/dL (ref 8.6–10.2)
Chloride: 96 mmol/L (ref 96–106)
GFR calc Af Amer: 68 mL/min/{1.73_m2} (ref >59)
GFR, EST NON AFRICAN AMERICAN: 59 mL/min/{1.73_m2} — AB (ref >59)
Glucose: 101 mg/dL — ABNORMAL HIGH (ref 65–99)
POTASSIUM: 4.9 mmol/L (ref 3.5–5.2)
SODIUM: 138 mmol/L (ref 134–144)

## 2017-03-15 ENCOUNTER — Encounter (HOSPITAL_COMMUNITY)
Admission: RE | Admit: 2017-03-15 | Discharge: 2017-03-15 | Disposition: A | Discharge status: Home / Self Care | Payer: Self-pay | Source: Ambulatory Visit | Attending: Cardiology | Admitting: Cardiology

## 2017-03-16 ENCOUNTER — Ambulatory Visit (INDEPENDENT_AMBULATORY_CARE_PROVIDER_SITE_OTHER): Payer: Medicare Other | Admitting: Cardiology

## 2017-03-16 ENCOUNTER — Encounter: Payer: Self-pay | Admitting: Cardiology

## 2017-03-16 VITALS — BP 130/72 | HR 95 | Ht 73.5 in | Wt 297.4 lb

## 2017-03-16 DIAGNOSIS — I25119 Atherosclerotic heart disease of native coronary artery with unspecified angina pectoris: Secondary | ICD-10-CM

## 2017-03-16 DIAGNOSIS — I255 Ischemic cardiomyopathy: Secondary | ICD-10-CM | POA: Diagnosis not present

## 2017-03-16 DIAGNOSIS — I10 Essential (primary) hypertension: Secondary | ICD-10-CM | POA: Diagnosis not present

## 2017-03-16 DIAGNOSIS — I4901 Ventricular fibrillation: Secondary | ICD-10-CM | POA: Diagnosis not present

## 2017-03-16 DIAGNOSIS — R7309 Other abnormal glucose: Secondary | ICD-10-CM | POA: Diagnosis not present

## 2017-03-16 DIAGNOSIS — I5042 Chronic combined systolic (congestive) and diastolic (congestive) heart failure: Secondary | ICD-10-CM | POA: Diagnosis not present

## 2017-03-16 DIAGNOSIS — I2129 ST elevation (STEMI) myocardial infarction involving other sites: Secondary | ICD-10-CM | POA: Diagnosis not present

## 2017-03-16 DIAGNOSIS — E785 Hyperlipidemia, unspecified: Secondary | ICD-10-CM | POA: Diagnosis not present

## 2017-03-16 MED ORDER — METOPROLOL SUCCINATE ER 50 MG PO TB24: ORAL_TABLET | ORAL | 3 refills | Status: DC

## 2017-03-16 MED ORDER — SPIRONOLACTONE 25 MG PO TABS: ORAL_TABLET | ORAL | 3 refills | Status: DC

## 2017-03-16 NOTE — Patient Instructions (Addendum)
Increase Toprol to 50 mg every afternoon   Start taking Spironolactone 12.5 mg every morning   Take Lasix 40 mg every morning   Lab work in 2 weeks  ( cmet,lipid panel )     Your physician wants you to follow-up in June. You will receive a reminder letter in the mail two months in advance. If you don't receive a letter, please call our office to schedule the follow-up appointment.

## 2017-03-16 NOTE — Progress Notes (Signed)
PCP: Leonard Rm, NP-C  Clinic Note: Chief Complaint  Patient presents with  . Follow-up    Pt has no complaints   . Coronary Artery Disease    Large inferior MI, complicated by VT, now with chronic LBBB.  . Cardiomyopathy    Combined ischemic and nonischemic    HPI: Leonard [name] is a 72 y.o. male with a PMH below who presents today for 8 month f/u for CAD-inferior STEMI complicated by sustained VT actually noticed during follow-up visit.  He was cardioverted and then had ICD placed..  Seen in Clinic March 19, 2016.  Was doing well from a cardiac standpoint.  Was upset as a result of his daughter's recent passing (she had been quadriplegic since age 5 surviving 25+ years) at that time he was on aspirin/Brilinta, atorvastatin 80 mg, 50 mg Toprol,  and 300 mg Avapro -> he was referred to cardiac rehab   Jun 09, 2016 admitted for heart failure - notably reduced LVEF & RWMA (see Echo), Cath with no new lesions.  Was (enrolled in the [facility]) discharged on ARB, Toprol and Lasix.   They noted that he had been drinking quite a bit of scotch whiskey since his daughter's death.   Follow-up with Dr. Caryl [name] June 13: Converted to [facility] .-Noted to have new left bundle branch block (considering possible resynchronization therapy - ) CRT-D  Seen by Dr. Haroldine [name] in [place] clinic June 28: Continued on Toprol 25 mg, Entresto 49/51 mg twice daily, Lasix 40 mg daily.  Added spironolactone 12.5 mg daily along with digoxin 0.25 mg daily.   Na+ restriction < 2000 mg daily, fluid restriction < 2L daily, daily weights.  Never did follow-up --> for titration of medications.  Was actually seen by Leonard [name] for hospital follow-up.   --> Planned recheck Echo in December - January  Studies Personally Reviewed - (if available, images/films reviewed: From Epic Chart or Care Everywhere)  Echo January 2019: EF 15-20%.  Severely reduced EF.  GI to DD.  Moderate MR.  Mild to have a moderately  elevated PAP (peak 56mmHg)  I last saw Leonard [name] in [place] November 18 to follow-up.  Apparently his medications were never titrated up.  He said he was feeling great, almost back to baseline.  No significant exertional dyspnea.  No chest tightness or pressure.  Stable voids with no significant edema.  -No significant chest pain issues or arrhythmia issues. Was trying to reduce alcohol use.  Recent Hospitalizations:   None  He was just seen by Dr. Caryl [name] on February 21 -follow-up for ventricular tachycardia and ICD implantation.-->  Plan was to move forward with CRT-D.  Interval History: Leonard [name] presents here today for follow-up after having seen heart failure clinic and EP.  He never did follow-up to titrate his medications, but he tells me that he is feeling great.  He is pretty much try to get back to baseline.   He indicates that he anticipates being evaluated for sleep apnea.  He has been having some issues with some daytime somnolence and snoring.  As for his cart heart failure goes, his home scale has been slightly lighter than here today, but he says he usually at home is roughly about the weight that he is today.  He may be 1 or 2 pounds up.  He is now doing cardiac rehab, very active. He denies any chest tightness or pressure with rest or exertion.  No more than usual, if not  somewhat improved exertional dyspnea.   No real PND orthopnea with trace to 1+ edema of which she is not overly concerned.  He usually controls this every now and then with Lasix dosing.  May be 1 or 2 times since last saw him is taken and actually  He has not had any further sensation of rapid or irregular heartbeats/palpitations.  Just the rare occasional "flip flop sensation from a PVC.  But no prolonged episodes.  No lightheadedness, Ding dizziness, syncope/near syncope or TIA/amaurosis fugax.  No real claudication symptoms. Continues to indicate that he is cutting back alcohol, but I do not get a full  sense of how much she is actually still drinking.   No claudication.  ROS: A comprehensive was performed. Review of Systems  Constitutional: Negative for chills, fever, malaise/fatigue and weight loss.  HENT: Negative for congestion.   Respiratory: Negative for cough and shortness of breath.   Musculoskeletal: Negative for falls, joint pain and myalgias.  Neurological: Negative for dizziness and weakness.  Psychiatric/Behavioral: Negative for depression (Mood seems to be much better) and memory loss. The patient does not have insomnia.   All other systems reviewed and are negative.  I have reviewed and (if needed) personally updated the patient's problem list, medications, allergies, past medical and surgical history, social and family history.   Past Medical History:  Diagnosis Date  . AICD (automatic cardioverter/defibrillator) present 08/26/2015  . Anxiety   . Atherosclerotic heart disease of native coronary artery with angina pectoris ([place]) 07/2015   a. 07/2015 Posterior STEMI/PCI: LM nl, LAd 40ost, RI 40, RCA 133m (3.0x22 Resolute Integrity DES distal, 3.0x12 Resolute Integrity DES prox), 90d (PTCA), RPDA small, nl, RPLB2 100 - too small for PTCA. -- patent in f/u cath 08/2015 & 05/2016 - patent RCA stents (~10%), RI 40%, LM ~30% ost LAD ~40%.  . Chronic combined systolic and diastolic CHF (congestive heart failure) ([place]) 07/2015   a. 07/2015 Ehco: EF 45-50%, basal-mid inf and infsept AK, inflat, apical inf, and apical septal HK, Gr1 DD, triv MR.;; b - Echo 05/2016 -> EF 20-25%, Gr 2 DD.  Severe diffuse HK disproportionately severe HK and thinning of apex, anterior septum and anterior wall-with septal dyssynchrony.  PAP ~53 mmHg.  [name] Hyperlipidemia with target low density lipoprotein (LDL) cholesterol less than 70 mg/dL   . Hypertensive heart disease   . Ischemic cardiomyopathy 07/2015 - 05/2016   a. 07/2015 Echo: EF 45-50%.; Follow-up Echo 08/23/2015: EF 50-55% with mild inferior hypokinesis  --> 05/2016: EF 20-25%, diffuse HK. PAP ~53 mmHg.  . Kidney disease, chronic, stage [name] (GFR 30-59 ml/min) ([place]) 08/12/2015  . Morbid obesity ([place])   . ST elevation myocardial infarction (STEMI) of inferior wall ([place]) 08/12/2015   Subacute presentation for inferior MI with post infarction angina on the following day. EKG still shows injury current. Therefore decided to proceed with intervention of 100% mRCA thrombotic lesion.  . Sustained ventricular fibrillation ([place]) 08/22/2015   Underwent EP evaluation with final ICD implantation.    Past Surgical History:  Procedure Laterality Date  . CARDIAC CATHETERIZATION N/A 08/12/2015   Procedure: Left Heart Cath and Coronary Angiography;  Surgeon: Leonie Man, MD;  Location: [place];  Service: Cardiovascular: Inferior STEMI: 100% mid RCA, 90% distal RCA, 100% RPL. 40% ostial and proximal LAD and branch of ramus.  [name] CARDIAC CATHETERIZATION N/A 08/26/2015   Procedure: Left Heart Cath and Coronary Angiography;  Surgeon: Belva Crome, MD;  Location: [facility] [place];  Service: Cardiovascular: To evaluate sustained VT. Widely patent RCA stent and distal PTCA site. Also patent RPL branch. LAD lesion appeared to be more consistent with 50-55% and 40%. Otherwise stable from previous cath. EF estimated 35-45%.  [name] CARDIOVERSION N/A 08/23/2015   Procedure: CARDIOVERSION;  Surgeon: Deboraha Sprang, MD;  Location: [facility];  Service: Cardiovascular;  Laterality: N/A;  . CORONARY ANGIOPLASTY WITH STENT PLACEMENT  08/12/2015   Mid RCA 100% reduced to 0% with 2 overlapping Resolute DES.  3.0 x 22 mm with a 3.0 x 12 mm prox overlarp (postdilated to 3.4 mm); PTCA of dRCA 90%. (Very dificult,complex case - tortuous, [place] RCA - unable to advance longer stents.  RPL2 noted to have thromboembolic 166% occlusion - unable to reach.  . ELECTROPHYSIOLOGIC STUDY N/A 08/26/2015   Procedure: Electrophysiology Study;  Surgeon: Deboraha Sprang, MD;  Location: [place] [facility];  Service: Cardiovascular;  Laterality: N/A;  . EP IMPLANTABLE DEVICE N/A 08/26/2015   Procedure: ICD Implant;  Surgeon: Deboraha Sprang, MD;  Location: [facility];  Service: Cardiovascular;  Laterality: LEFT:  St Jude ICD, serial number [id number].   [name] HEMORRHOID SURGERY    . RIGHT/LEFT HEART CATH AND CORONARY ANGIOGRAPHY N/A 06/10/2016   Procedure: Right/Left Heart Cath and Coronary Angiography;  Surgeon: Martinique, Peter M, MD;  Location: [facility] INVASIVE CV LAB:  Nonobstructive CAD. RCA stents PATENT.  Ost-Prox RCA, 10 %. Ost LM 30 %. Ost LAD 40 %. RI, 40 %; High PCWP  & LVEDP (23 mmHg) ; PAP 43/23/31 mmHg. CO/CI 3.89 / 1/5 (Severely Reduced)  . SKIN SURGERY    . TRANSTHORACIC ECHOCARDIOGRAM  08/13/2015; 08/23/2015   a. Mild concentric LVH. EF 45-50%. Basal-mid inferior and inferoseptal akinesis with hypokinesis of inferolateral and apical inferior wall. 1 DD. ;; b. Technically difficult study. Definity contrast administered.  Compared to a prior echo in 07/2015, the LVEF is slightly higher at 50-55% with mild inferior hypokinesis.  . TRANSTHORACIC ECHOCARDIOGRAM  06/09/2016   EF  20-25%, GR 2 DD. Severe diffuse  hypokinesis with distinct regional wall motion abnormalities.   There is disproportionately severe hypokinesis and thinning of the apex, anterior septum and anterior wall (septal dyssynchrony). However, there is severe dyssynchrony , making it difficult to assess regional function.  PA P ~53 mmHg  . TRANSTHORACIC ECHOCARDIOGRAM  01/2017   Severely reduced EF.  GI to DD.  Moderate MR.  Mild to have a moderately elevated PAP (peak 45mmHg)     Current Outpatient Medications on File Prior to Visit  Medication Sig Dispense Refill  . aspirin EC 81 MG tablet Take 1 tablet (81 mg total) by mouth daily.    [name] atorvastatin (LIPITOR) 80 MG tablet Take 1 tablet (80 mg total) by mouth daily at 6 PM. 30 tablet 12  . Cholecalciferol (VITAMIN D) 2000 units CAPS Take 1 capsule by mouth daily.      . Coenzyme Q10 (COQ10) 200 MG CAPS Take 200 mg by mouth daily.    . digoxin (LANOXIN) 0.125 MG tablet Take 1 tablet (0.125 mg total) by mouth daily. 90 tablet 3  . Investigational - Study Medication Take 1 tablet by mouth 2 (two) times daily. Study name: Galactic HF Study Additional study details: Omecamtiv Mecarbil or Placebo 14 each PRN  . MAGNESIUM-OXIDE 400 (241.3 Mg) MG tablet TAKE 1 TABLET BY MOUTH TWICE DAILY. 60 tablet 8  . Multiple Vitamin (MULTIVITAMIN) tablet Take 1 tablet by mouth daily.    [name]  nitroGLYCERIN (NITROSTAT) 0.4 MG SL tablet Place 1 tablet (0.4 mg total) under the tongue every 5 (five) minutes as needed for chest pain. 25 tablet 2  . Omega-3 Fatty Acids (FISH OIL) 1200 MG CPDR Take 1 tablet by mouth daily.    . sacubitril-valsartan (ENTRESTO) 49-51 MG Take 1 tablet by mouth 2 (two) times daily. 60 tablet 11  . ticagrelor (BRILINTA) 90 MG TABS tablet Take 1 tablet (90 mg total) by mouth 2 (two) times daily. 60 tablet 12  . venlafaxine (EFFEXOR) 75 MG tablet TAKE 1 TABLET BY MOUTH EVERY DAY 30 tablet 2  . furosemide (LASIX) 40 MG tablet Take 40 mg every morning 90 tablet 3   No current facility-administered medications on file prior to visit.      Allergies  Allergen Reactions  . Other Itching    Ragweed causes watery eyes, itching, sneezing  . Sulfa Antibiotics Other (See Comments)    Pt unsure of reaction, mother told him he was allergic    Social History   Tobacco Use  . Smoking status: Never Smoker  . Smokeless tobacco: Never Used  Substance Use Topics  . Alcohol use: Yes    Alcohol/week: 1.8 - 2.4 oz    Types: 3 - 4 Shots of liquor per week    Comment: 3-4 glasses of scotch 3-4 days per week  . Drug use: No   Social History   Social History Narrative   He tells me that his daughter died on Halloween 05/12/15. She had a diving accident at the age of 38 and been quadriplegic for 25 years. She had been incredibly vigorous girl. He showed me her high school  graduation picture.She was as he reported beautiful.  Her name was [name].   Family History family history includes Diabetes in his mother; Heart disease in his paternal grandfather and paternal grandmother.  Wt Readings from Last 3 Encounters:  03/16/17 297 lb 6.4 oz (134.9 kg)  03/11/17 294 lb 4 oz (133.5 kg)  02/19/17 292 lb (132.5 kg)  -- last wgt here 298 lb.  PHYSICAL EXAM BP 130/72 (BP Location: Right Arm, Patient Position: Sitting, Cuff Size: Normal)   Pulse 95   Ht 6' 1.5" (1.867 m)   Wt 297 lb 6.4 oz (134.9 kg)   BMI 38.71 kg/m  - home wgt: stable ~292-293 pre-breakfast & nude. Physical Exam  Constitutional: He is oriented to person, place, and time. He appears well-developed and well-nourished. No distress.  Remains borderline morbidly obese  HENT:  Head: Normocephalic and atraumatic.  Mouth/Throat: No oropharyngeal exudate.  Eyes: Conjunctivae and EOM are normal. No scleral icterus.  Neck: Normal range of motion. Neck supple. No hepatojugular reflux and no JVD (Really cannot tell due to body habitus) present. Carotid bruit is not present.  Cardiovascular: Normal rate, regular rhythm and intact distal pulses. Frequent extrasystoles are present. PMI is not displaced (Unable to palpate). Exam reveals distant heart sounds. Exam reveals no gallop and no friction rub.  No murmur heard. Very difficult to hear heart sounds due to body habitus  Pulmonary/Chest: Effort normal and breath sounds normal. No respiratory distress. He has no wheezes. He has no rales.  Abdominal: Soft. Bowel sounds are normal. He exhibits no distension. There is no tenderness. There is no rebound.  Morbidly obese  Musculoskeletal: Normal range of motion. He exhibits edema (1-2+ left greater than trace-1+ right lower leg edema.).  Mild venous stasis changes  Neurological: He is alert and oriented to person,  place, and time.  Skin: Skin is warm and dry. No rash noted. No erythema.  Psychiatric: He has a  normal mood and affect. His behavior is normal. Judgment and thought content normal.  Nursing note and vitals reviewed.    Adult ECG Report  Rate: 95 ;  Rhythm: normal sinus rhythm, premature ventricular contractions (PVC) and Possible fusion beats.  Left axis deviation (-65) with LBBB.;   Narrative Interpretation: Relatively stable EKG.  PVCs are more notable.  Other studies Reviewed: Additional studies/ records that were reviewed today include:  Recent Labs:   Lab Results  Component Value Date   CHOL 134 06/10/2016   HDL 34 (L) 06/10/2016   LDLCALC 68 06/10/2016   TRIG 160 (H) 06/10/2016   CHOLHDL 3.9 06/10/2016   Lab Results  Component Value Date   CREATININE 1.21 03/11/2017   BUN 18 03/11/2017   NA 138 03/11/2017   K 4.9 03/11/2017   CL 96 03/11/2017   CO2 24 03/11/2017   Lab Results  Component Value Date   WBC 11.9 (H) 07/14/2016   HGB 15.3 07/14/2016   HCT 47.0 07/14/2016   MCV 98.9 07/14/2016   PLT 221 07/14/2016   Lab Results  Component Value Date   HGBA1C 6.1 (H) 08/12/2015    ASSESSMENT / PLAN: Problem List Items Addressed This Visit    Chronic combined systolic and diastolic CHF (congestive heart failure) ([place]) - Primary (Chronic)    He actually seems borderline euvolemic today may be just a little bit up as far as his dry weight scale.  He has combined severe systolic dysfunction as well as clearly diastolic dysfunction.  He is now on a stable standing dose of Lasix and the freedom to take additional doses as needed weight gain. He had been on spironolactone, but I guess [place] did not realize that it was a new medicine and never got it filled.  Plan: Restart Spironolactone 12.5 mg daily.  Continue to take Lasix standing every morning -with as needed dosing for increased weight gain  Continue current dose of Entresto, if pressure continues to be stable after increasing Toprol, I would further titrate up Entresto.  For now we will increase Toprol to 50 mg  daily given his heart rate of 95 and increased PVCs.  Continue digoxin  Continues to be enrolled in the Glactic HF study.      Relevant Medications   spironolactone (ALDACTONE) 25 MG tablet   furosemide (LASIX) 40 MG tablet   metoprolol succinate (TOPROL-XL) 50 MG 24 hr tablet   Other Relevant Orders   EKG 12-Lead (Completed)   Comprehensive Metabolic Panel (CMET)   Lipid Panel w/o Chol/HDL Ratio   Coronary artery disease involving native coronary artery with angina pectoris (HCC) (Chronic)    Extensive PCI to the RCA with 2 overlapping stents.  He had PTCA of the PDA as well.  His coronaries have been stable now for 2 separate follow-up cath. He remains on aspirin plus Brilinta (would probably be okay to change to 60 mg Brilinta and stop aspirin -can discuss this in follow-up visits))  He is on atorvastatin, Toprol as well as Entresto.  No further anginal symptoms.  For now only titrating up his metoprolol succinate to 50 mg      Relevant Medications   spironolactone (ALDACTONE) 25 MG tablet   furosemide (LASIX) 40 MG tablet   metoprolol succinate (TOPROL-XL) 50 MG 24 hr tablet   Other Relevant Orders   EKG 12-Lead (Completed)  Comprehensive Metabolic Panel (CMET)   Lipid Panel w/o Chol/HDL Ratio   Elevated glucose   Essential hypertension (Chronic)    Blood pressure looks pretty good, although he may still have room to further titrate meds.  Plan for now will be to titrate Toprol to 50 mg.  Then next step would be Entresto to final dose.      Relevant Medications   spironolactone (ALDACTONE) 25 MG tablet   furosemide (LASIX) 40 MG tablet   metoprolol succinate (TOPROL-XL) 50 MG 24 hr tablet   Other Relevant Orders   EKG 12-Lead (Completed)   Comprehensive Metabolic Panel (CMET)   Lipid Panel w/o Chol/HDL Ratio   Hyperlipidemia LDL goal <70 (Chronic)    LDL is pretty good last May on high-dose atorvastatin.  He is due for recheck now.  (Was supposed to have been checked  prior to this visit, but was not)  Needs to work on diet/exercise and weight loss, perhaps this would allow [place] to reduce his atorvastatin dose.  Otherwise continue co-Q10 and fish well as well.      Relevant Medications   spironolactone (ALDACTONE) 25 MG tablet   furosemide (LASIX) 40 MG tablet   metoprolol succinate (TOPROL-XL) 50 MG 24 hr tablet   Ischemic cardiomyopathy (Chronic)    Interestingly the he initially had notable improvement in his EF after his MI/infarct, but then had sustained ventricular tachycardia leading to ICD placement and EP procedure.  Ever since then he has had this left bundle branch block and I suspect that that is partly responsible for his worsening cardiomyopathy.  Unfortunately now his EF is even worse than it was last year.   He is on a relatively stable regimen having minimal converted over to [place].  He remains on stable dose of furosemide along with Toprol which I will increase today to 50 mg daily.  I will also add back his Spironolactone that was somehow discontinued.  Plan per EP is to proceed with CRT-D upgrade during the spring, prior to summer.       Relevant Medications   spironolactone (ALDACTONE) 25 MG tablet   furosemide (LASIX) 40 MG tablet   metoprolol succinate (TOPROL-XL) 50 MG 24 hr tablet   Morbid obesity (HCC) (Chronic)    He had been doing pretty well with weight loss, but is now become stagnant.  He is now working with cardiac rehab, and needs to adjust his diet.  Hopefully this will allow for continued weight loss and therefore decrease stress on his heart.      ST elevation myocardial infarction (STEMI) of true posterior wall, subsequent episode of care [facility]) (Chronic)    Initial presenting infarct was in late July 2017 with an occluded RCA and extensive disease throughout the RCA system.  He initially did relatively well postop, and had EF of 35-40%.  Unfortunately 1 month later on follow-up visit, he was found to be in  apparently sustained VT (versus A. fib with aberrancy) he ended up undergoing cardioversion and ICD implant in August 2017. He has had 2 follow-up cardiac catheterizations in August 2017 and then May of last year showing widely patent RCA stents and distal RCA with moderate disease elsewhere.  Unfortunately his EF has declined, probably more related to LBBB and potentially RV pacing.      Relevant Medications   spironolactone (ALDACTONE) 25 MG tablet   furosemide (LASIX) 40 MG tablet   metoprolol succinate (TOPROL-XL) 50 MG 24 hr tablet   Sustained ventricular fibrillation (  [place]) (Chronic)    No further episodes of sustained VT.  Status post ICD placement with plan to upgrade to [facility] for cardiac resynchronization (CRT)      Relevant Medications   spironolactone (ALDACTONE) 25 MG tablet   furosemide (LASIX) 40 MG tablet   metoprolol succinate (TOPROL-XL) 50 MG 24 hr tablet      Current medicines are reviewed at length with the patient today. (+/- concerns) n/a The following changes have been made: n/a  Patient Instructions  Increase Toprol to 50 mg every afternoon   Start taking Spironolactone 12.5 mg every morning   Take Lasix 40 mg every morning   Lab work in 2 weeks  ( cmet,lipid panel )     Your physician wants you to follow-up in June. You will receive a reminder letter in the mail two months in advance. If you don't receive a letter, please call our office to schedule the follow-up appointment.    Studies Ordered:   Orders Placed This Encounter  Procedures  . Comprehensive Metabolic Panel (CMET)  . Lipid Panel w/o Chol/HDL Ratio  . EKG 12-Lead      Glenetta Hew, M.D., M.S. Interventional Cardiologist   Pager # [phone] Phone # [phone] [address] [place], [place] [address]

## 2017-03-17 ENCOUNTER — Encounter: Payer: Self-pay | Admitting: Cardiology

## 2017-03-17 ENCOUNTER — Encounter (HOSPITAL_COMMUNITY): Payer: Self-pay

## 2017-03-17 NOTE — Assessment & Plan Note (Signed)
No further episodes of sustained VT.  Status post ICD placement with plan to upgrade to [facility] for cardiac resynchronization (CRT)

## 2017-03-17 NOTE — Assessment & Plan Note (Signed)
Extensive PCI to the RCA with 2 overlapping stents.  He had PTCA of the PDA as well.  His coronaries have been stable now for 2 separate follow-up cath. He remains on aspirin plus Brilinta (would probably be okay to change to 60 mg Brilinta and stop aspirin -can discuss this in follow-up visits))  He is on atorvastatin, Toprol as well as Entresto.  No further anginal symptoms.  For now only titrating up his metoprolol succinate to 50 mg

## 2017-03-17 NOTE — Assessment & Plan Note (Addendum)
Interestingly the he initially had notable improvement in his EF after his MI/infarct, but then had sustained ventricular tachycardia leading to ICD placement and EP procedure.  Ever since then he has had this left bundle branch block and I suspect that that is partly responsible for his worsening cardiomyopathy.  Unfortunately now his EF is even worse than it was last year.   He is on a relatively stable regimen having minimal converted over to [place].  He remains on stable dose of furosemide along with Toprol which I will increase today to 50 mg daily.  I will also add back his Spironolactone that was somehow discontinued.  Plan per EP is to proceed with CRT-D upgrade during the spring, prior to summer.

## 2017-03-17 NOTE — Assessment & Plan Note (Addendum)
He actually seems borderline euvolemic today may be just a little bit up as far as his dry weight scale.  He has combined severe systolic dysfunction as well as clearly diastolic dysfunction.  He is now on a stable standing dose of Lasix and the freedom to take additional doses as needed weight gain. He had been on spironolactone, but I guess [place] did not realize that it was a new medicine and never got it filled.  Plan: Restart Spironolactone 12.5 mg daily.  Continue to take Lasix standing every morning -with as needed dosing for increased weight gain  Continue current dose of Entresto, if pressure continues to be stable after increasing Toprol, I would further titrate up Entresto.  For now we will increase Toprol to 50 mg daily given his heart rate of 95 and increased PVCs.  Continue digoxin  Continues to be enrolled in the Glactic HF study.

## 2017-03-17 NOTE — Assessment & Plan Note (Signed)
LDL is pretty good last May on high-dose atorvastatin.  He is due for recheck now.  (Was supposed to have been checked prior to this visit, but was not)  Needs to work on diet/exercise and weight loss, perhaps this would allow [place] to reduce his atorvastatin dose.  Otherwise continue co-Q10 and fish well as well.

## 2017-03-17 NOTE — Assessment & Plan Note (Addendum)
Initial presenting infarct was in late July 2017 with an occluded RCA and extensive disease throughout the RCA system.  He initially did relatively well postop, and had EF of 35-40%.  Unfortunately 1 month later on follow-up visit, he was found to be in apparently sustained VT (versus A. fib with aberrancy) he ended up undergoing cardioversion and ICD implant in August 2017. He has had 2 follow-up cardiac catheterizations in August 2017 and then May of last year showing widely patent RCA stents and distal RCA with moderate disease elsewhere.  Unfortunately his EF has declined, probably more related to LBBB and potentially RV pacing.

## 2017-03-17 NOTE — Assessment & Plan Note (Signed)
Blood pressure looks pretty good, although he may still have room to further titrate meds.  Plan for now will be to titrate Toprol to 50 mg.  Then next step would be Entresto to final dose.

## 2017-03-17 NOTE — Assessment & Plan Note (Signed)
He had been doing pretty well with weight loss, but is now become stagnant.  He is now working with cardiac rehab, and needs to adjust his diet.  Hopefully this will allow for continued weight loss and therefore decrease stress on his heart.

## 2017-03-19 ENCOUNTER — Encounter (HOSPITAL_COMMUNITY)
Admission: RE | Admit: 2017-03-19 | Discharge: 2017-03-19 | Disposition: A | Discharge status: Home / Self Care | Payer: Self-pay | Source: Ambulatory Visit | Attending: Cardiology | Admitting: Cardiology

## 2017-03-19 DIAGNOSIS — I10 Essential (primary) hypertension: Secondary | ICD-10-CM | POA: Insufficient documentation

## 2017-03-19 DIAGNOSIS — I2129 ST elevation (STEMI) myocardial infarction involving other sites: Secondary | ICD-10-CM | POA: Insufficient documentation

## 2017-03-19 DIAGNOSIS — I255 Ischemic cardiomyopathy: Secondary | ICD-10-CM | POA: Insufficient documentation

## 2017-03-22 ENCOUNTER — Encounter (HOSPITAL_COMMUNITY)
Admission: RE | Admit: 2017-03-22 | Discharge: 2017-03-22 | Disposition: A | Discharge status: Home / Self Care | Payer: Self-pay | Source: Ambulatory Visit | Attending: Cardiology | Admitting: Cardiology

## 2017-03-23 ENCOUNTER — Telehealth: Payer: Self-pay | Admitting: *Deleted

## 2017-03-23 NOTE — Telephone Encounter (Signed)
Per Dr Caryl Comes Home sleep test ordered.  Informed patient of upcoming home sleep study and patient understanding was verbalized. Patient understands his sleep study will be done at [place] with [facility].. Patient understands he will receive a CALL in a week or so. Patient understands to call if he does not receive the sleep packet in a timely manner. Patient agrees with treatment and thanked me for call.

## 2017-03-24 ENCOUNTER — Encounter (HOSPITAL_COMMUNITY)
Admission: RE | Admit: 2017-03-24 | Discharge: 2017-03-24 | Disposition: A | Discharge status: Home / Self Care | Payer: Self-pay | Source: Ambulatory Visit | Attending: Cardiology | Admitting: Cardiology

## 2017-03-26 ENCOUNTER — Encounter (HOSPITAL_COMMUNITY): Payer: Self-pay

## 2017-03-29 ENCOUNTER — Encounter (HOSPITAL_COMMUNITY)
Admission: RE | Admit: 2017-03-29 | Discharge: 2017-03-29 | Disposition: A | Discharge status: Home / Self Care | Payer: Self-pay | Source: Ambulatory Visit | Attending: Cardiology | Admitting: Cardiology

## 2017-03-30 DIAGNOSIS — H52203 Unspecified astigmatism, bilateral: Secondary | ICD-10-CM | POA: Diagnosis not present

## 2017-03-30 DIAGNOSIS — H2513 Age-related nuclear cataract, bilateral: Secondary | ICD-10-CM | POA: Diagnosis not present

## 2017-03-31 ENCOUNTER — Encounter (HOSPITAL_COMMUNITY)
Admission: RE | Admit: 2017-03-31 | Discharge: 2017-03-31 | Disposition: A | Discharge status: Home / Self Care | Payer: Self-pay | Source: Ambulatory Visit | Attending: Cardiology | Admitting: Cardiology

## 2017-04-02 ENCOUNTER — Encounter (HOSPITAL_COMMUNITY)
Admission: RE | Admit: 2017-04-02 | Discharge: 2017-04-02 | Disposition: A | Discharge status: Home / Self Care | Payer: Medicare Other | Source: Ambulatory Visit | Attending: Cardiology | Admitting: Cardiology

## 2017-04-05 ENCOUNTER — Encounter (HOSPITAL_COMMUNITY): Payer: Self-pay

## 2017-04-07 ENCOUNTER — Encounter (HOSPITAL_COMMUNITY)
Admission: RE | Admit: 2017-04-07 | Discharge: 2017-04-07 | Disposition: A | Discharge status: Home / Self Care | Payer: Self-pay | Source: Ambulatory Visit | Attending: Cardiology | Admitting: Cardiology

## 2017-04-08 ENCOUNTER — Other Ambulatory Visit (HOSPITAL_COMMUNITY): Payer: Self-pay | Admitting: Student

## 2017-04-09 ENCOUNTER — Other Ambulatory Visit: Payer: Self-pay | Admitting: Cardiology

## 2017-04-09 ENCOUNTER — Encounter (HOSPITAL_COMMUNITY): Payer: Self-pay

## 2017-04-09 DIAGNOSIS — I472 Ventricular tachycardia, unspecified: Secondary | ICD-10-CM

## 2017-04-09 NOTE — Telephone Encounter (Signed)
REFILL 

## 2017-04-12 ENCOUNTER — Encounter (HOSPITAL_COMMUNITY)
Admission: RE | Admit: 2017-04-12 | Discharge: 2017-04-12 | Disposition: A | Discharge status: Home / Self Care | Payer: Self-pay | Source: Ambulatory Visit | Attending: Cardiology | Admitting: Cardiology

## 2017-04-12 ENCOUNTER — Other Ambulatory Visit: Payer: Self-pay

## 2017-04-12 MED ORDER — METOPROLOL SUCCINATE ER 50 MG PO TB24: ORAL_TABLET | ORAL | 3 refills | Status: DC

## 2017-04-12 NOTE — Telephone Encounter (Signed)
This is Dr. Harding's pt. °

## 2017-04-13 ENCOUNTER — Telehealth (HOSPITAL_COMMUNITY): Payer: Self-pay | Admitting: Internal Medicine

## 2017-04-13 NOTE — Telephone Encounter (Signed)
Left message for patient to call back.  Per staff message from Georgeanna Lea, RN, pt needs f/u with APP side for med refills

## 2017-04-14 ENCOUNTER — Encounter (HOSPITAL_COMMUNITY)
Admission: RE | Admit: 2017-04-14 | Discharge: 2017-04-14 | Disposition: A | Discharge status: Home / Self Care | Payer: Self-pay | Source: Ambulatory Visit | Attending: Cardiology | Admitting: Cardiology

## 2017-04-16 ENCOUNTER — Encounter (HOSPITAL_COMMUNITY): Payer: Self-pay

## 2017-04-19 NOTE — Telephone Encounter (Signed)
Tried to reach pt to schedule appt.  LVM for pt to call our office

## 2017-04-21 ENCOUNTER — Encounter (HOSPITAL_COMMUNITY)
Admission: RE | Admit: 2017-04-21 | Discharge: 2017-04-21 | Disposition: A | Discharge status: Home / Self Care | Payer: Self-pay | Source: Ambulatory Visit | Attending: Cardiology | Admitting: Cardiology

## 2017-04-21 ENCOUNTER — Telehealth: Payer: Self-pay

## 2017-04-21 DIAGNOSIS — I2129 ST elevation (STEMI) myocardial infarction involving other sites: Secondary | ICD-10-CM | POA: Insufficient documentation

## 2017-04-21 NOTE — Telephone Encounter (Signed)
Pt was called to schedule AWV due in May 2019 No answer lvm. [place]

## 2017-04-22 NOTE — Telephone Encounter (Signed)
Patient received home sleep kit on 03/30/17. As of 04/22/17 patient had not completed or returned the kit. lmtcb to get status update from patient. Will mail incomplete testing letter next week if no contact from patient.

## 2017-04-23 ENCOUNTER — Encounter (HOSPITAL_COMMUNITY)
Admission: RE | Admit: 2017-04-23 | Discharge: 2017-04-23 | Disposition: A | Discharge status: Home / Self Care | Payer: Self-pay | Source: Ambulatory Visit | Attending: Cardiology | Admitting: Cardiology

## 2017-04-26 ENCOUNTER — Encounter (HOSPITAL_COMMUNITY): Payer: Self-pay

## 2017-04-28 ENCOUNTER — Encounter (HOSPITAL_COMMUNITY)
Admission: RE | Admit: 2017-04-28 | Discharge: 2017-04-28 | Disposition: A | Discharge status: Home / Self Care | Payer: Self-pay | Source: Ambulatory Visit | Attending: Cardiology | Admitting: Cardiology

## 2017-04-28 ENCOUNTER — Encounter: Payer: Self-pay | Admitting: *Deleted

## 2017-04-30 ENCOUNTER — Encounter (HOSPITAL_COMMUNITY)
Admission: RE | Admit: 2017-04-30 | Discharge: 2017-04-30 | Disposition: A | Discharge status: Home / Self Care | Payer: Self-pay | Source: Ambulatory Visit | Attending: Cardiology | Admitting: Cardiology

## 2017-04-30 NOTE — Telephone Encounter (Signed)
Called pt and LVM to call our office to schedule f/u for med refills.  Will mail letter to patient asking him to call our office.

## 2017-05-03 ENCOUNTER — Encounter (HOSPITAL_COMMUNITY)
Admission: RE | Admit: 2017-05-03 | Discharge: 2017-05-03 | Disposition: A | Discharge status: Home / Self Care | Payer: Self-pay | Source: Ambulatory Visit | Attending: Cardiology | Admitting: Cardiology

## 2017-05-05 ENCOUNTER — Encounter (HOSPITAL_COMMUNITY): Payer: Self-pay

## 2017-05-07 ENCOUNTER — Encounter (HOSPITAL_COMMUNITY): Payer: Self-pay

## 2017-05-10 ENCOUNTER — Encounter (HOSPITAL_COMMUNITY): Payer: Self-pay

## 2017-05-11 ENCOUNTER — Encounter (HOSPITAL_COMMUNITY): Payer: Self-pay | Admitting: Internal Medicine

## 2017-05-12 ENCOUNTER — Encounter (HOSPITAL_COMMUNITY): Payer: Self-pay

## 2017-05-14 ENCOUNTER — Encounter (HOSPITAL_COMMUNITY): Payer: Self-pay

## 2017-05-17 ENCOUNTER — Encounter: Payer: Medicare Other | Admitting: *Deleted

## 2017-05-17 ENCOUNTER — Encounter (HOSPITAL_COMMUNITY): Payer: Self-pay

## 2017-05-17 VITALS — BP 112/72 | HR 85 | Wt 292.8 lb

## 2017-05-17 DIAGNOSIS — Z006 Encounter for examination for normal comparison and control in clinical research program: Secondary | ICD-10-CM

## 2017-05-17 NOTE — Progress Notes (Signed)
RESEARCH ENCOUNTER  Patient ID: Leonard [name]  DOB: 1945/03/17  Leonard [name] presented to the [facility] for the Week 48 visit of the [facility].  All Week 48 study procedures completed without event.  No signs/symptoms of ACS since the last visit. IP returned and additional IP dispensed.  New ICF signed.  Patient will follow up with [facility] in 16 weeks.

## 2017-05-19 ENCOUNTER — Encounter (HOSPITAL_COMMUNITY)
Admission: RE | Admit: 2017-05-19 | Discharge: 2017-05-19 | Disposition: A | Discharge status: Home / Self Care | Payer: Self-pay | Source: Ambulatory Visit | Attending: Cardiology | Admitting: Cardiology

## 2017-05-19 DIAGNOSIS — I2129 ST elevation (STEMI) myocardial infarction involving other sites: Secondary | ICD-10-CM | POA: Insufficient documentation

## 2017-05-21 ENCOUNTER — Encounter (HOSPITAL_COMMUNITY)
Admission: RE | Admit: 2017-05-21 | Discharge: 2017-05-21 | Disposition: A | Discharge status: Home / Self Care | Payer: Self-pay | Source: Ambulatory Visit | Attending: Cardiology | Admitting: Cardiology

## 2017-05-24 ENCOUNTER — Encounter (HOSPITAL_COMMUNITY)
Admission: RE | Admit: 2017-05-24 | Discharge: 2017-05-24 | Disposition: A | Discharge status: Home / Self Care | Payer: Self-pay | Source: Ambulatory Visit | Attending: Cardiology | Admitting: Cardiology

## 2017-05-26 ENCOUNTER — Other Ambulatory Visit: Payer: Self-pay | Admitting: Family Medicine

## 2017-05-26 ENCOUNTER — Encounter (HOSPITAL_COMMUNITY)
Admission: RE | Admit: 2017-05-26 | Discharge: 2017-05-26 | Disposition: A | Discharge status: Home / Self Care | Payer: Self-pay | Source: Ambulatory Visit | Attending: Cardiology | Admitting: Cardiology

## 2017-05-26 NOTE — Telephone Encounter (Signed)
Fax came today for patient from [employer]. stating his test sensors were returned unopened therefore no test data was recorded. Reached out to patient and left a detailed message on voicemail to call back to either reschedule or leave incomplete.

## 2017-05-26 NOTE — Telephone Encounter (Signed)
Pt is due for an appt, I called and left a message for him to call back and schedule an appt. Is this okay to refill for 30 days only

## 2017-05-26 NOTE — Telephone Encounter (Signed)
yes

## 2017-05-28 ENCOUNTER — Encounter (HOSPITAL_COMMUNITY)
Admission: RE | Admit: 2017-05-28 | Discharge: 2017-05-28 | Disposition: A | Discharge status: Home / Self Care | Payer: Self-pay | Source: Ambulatory Visit | Attending: Cardiology | Admitting: Cardiology

## 2017-05-31 ENCOUNTER — Encounter (HOSPITAL_COMMUNITY)
Admission: RE | Admit: 2017-05-31 | Discharge: 2017-05-31 | Disposition: A | Discharge status: Home / Self Care | Payer: Self-pay | Source: Ambulatory Visit | Attending: Cardiology | Admitting: Cardiology

## 2017-06-02 ENCOUNTER — Encounter (HOSPITAL_COMMUNITY): Payer: Self-pay

## 2017-06-04 ENCOUNTER — Encounter (HOSPITAL_COMMUNITY)
Admission: RE | Admit: 2017-06-04 | Discharge: 2017-06-04 | Disposition: A | Discharge status: Home / Self Care | Payer: Self-pay | Source: Ambulatory Visit | Attending: Cardiology | Admitting: Cardiology

## 2017-06-07 ENCOUNTER — Encounter (HOSPITAL_COMMUNITY)
Admission: RE | Admit: 2017-06-07 | Discharge: 2017-06-07 | Disposition: A | Discharge status: Home / Self Care | Payer: Self-pay | Source: Ambulatory Visit | Attending: Cardiology | Admitting: Cardiology

## 2017-06-09 ENCOUNTER — Encounter (HOSPITAL_COMMUNITY)
Admission: RE | Admit: 2017-06-09 | Discharge: 2017-06-09 | Disposition: A | Discharge status: Home / Self Care | Payer: Self-pay | Source: Ambulatory Visit | Attending: Cardiology | Admitting: Cardiology

## 2017-06-11 ENCOUNTER — Encounter (HOSPITAL_COMMUNITY)
Admission: RE | Admit: 2017-06-11 | Discharge: 2017-06-11 | Disposition: A | Discharge status: Home / Self Care | Payer: Self-pay | Source: Ambulatory Visit | Attending: Cardiology | Admitting: Cardiology

## 2017-06-16 ENCOUNTER — Encounter (HOSPITAL_COMMUNITY)
Admission: RE | Admit: 2017-06-16 | Discharge: 2017-06-16 | Disposition: A | Discharge status: Home / Self Care | Payer: Medicare Other | Source: Ambulatory Visit | Attending: Cardiology | Admitting: Cardiology

## 2017-06-18 ENCOUNTER — Encounter (HOSPITAL_COMMUNITY): Payer: Self-pay

## 2017-06-21 ENCOUNTER — Encounter (HOSPITAL_COMMUNITY)
Admission: RE | Admit: 2017-06-21 | Discharge: 2017-06-21 | Disposition: A | Discharge status: Home / Self Care | Payer: Self-pay | Source: Ambulatory Visit | Attending: Cardiology | Admitting: Cardiology

## 2017-06-21 DIAGNOSIS — I2129 ST elevation (STEMI) myocardial infarction involving other sites: Secondary | ICD-10-CM | POA: Insufficient documentation

## 2017-06-23 ENCOUNTER — Encounter (HOSPITAL_COMMUNITY): Payer: Self-pay

## 2017-06-25 ENCOUNTER — Encounter (HOSPITAL_COMMUNITY)
Admission: RE | Admit: 2017-06-25 | Discharge: 2017-06-25 | Disposition: A | Discharge status: Home / Self Care | Payer: Self-pay | Source: Ambulatory Visit | Attending: Cardiology | Admitting: Cardiology

## 2017-06-25 ENCOUNTER — Other Ambulatory Visit: Payer: Self-pay | Admitting: Family Medicine

## 2017-06-25 ENCOUNTER — Telehealth: Payer: Self-pay | Admitting: Family Medicine

## 2017-06-25 NOTE — Telephone Encounter (Signed)
Pt called and made a medcheck appt for June the 27th if we could refill his Venlafaxine informed pt that Loletha Carrow was out of the office until Monday and that she would have to approve this

## 2017-06-25 NOTE — Telephone Encounter (Signed)
Pt was called on 5/8 as well to schedule an appt as we refilled this for 30 days and pt did not schedule and now another refill coming in. I have called pt again today to let him know on VM that he needs an appt before this can be refilled as we refilled this last month for him and he did not schedule anything. If he does schedule something we can see if we can get ok from pcp to refill this until pt's appt but pt must schedule something before refilling

## 2017-06-28 ENCOUNTER — Encounter (HOSPITAL_COMMUNITY)
Admission: RE | Admit: 2017-06-28 | Discharge: 2017-06-28 | Disposition: A | Discharge status: Home / Self Care | Payer: Self-pay | Source: Ambulatory Visit | Attending: Cardiology | Admitting: Cardiology

## 2017-06-28 MED ORDER — VENLAFAXINE HCL 75 MG PO TABS: 75.0000 mg | ORAL_TABLET | Freq: Every day | ORAL | 0 refills | Status: DC

## 2017-06-28 NOTE — Telephone Encounter (Signed)
done

## 2017-06-28 NOTE — Telephone Encounter (Signed)
Ok to refill 

## 2017-06-30 ENCOUNTER — Encounter (HOSPITAL_COMMUNITY)
Admission: RE | Admit: 2017-06-30 | Discharge: 2017-06-30 | Disposition: A | Discharge status: Home / Self Care | Payer: Self-pay | Source: Ambulatory Visit | Attending: Cardiology | Admitting: Cardiology

## 2017-07-01 ENCOUNTER — Ambulatory Visit: Payer: Medicare Other | Admitting: Cardiology

## 2017-07-02 ENCOUNTER — Encounter (HOSPITAL_COMMUNITY)
Admission: RE | Admit: 2017-07-02 | Discharge: 2017-07-02 | Disposition: A | Discharge status: Home / Self Care | Payer: Self-pay | Source: Ambulatory Visit | Attending: Cardiology | Admitting: Cardiology

## 2017-07-05 ENCOUNTER — Encounter (HOSPITAL_COMMUNITY): Payer: Self-pay

## 2017-07-07 ENCOUNTER — Encounter (HOSPITAL_COMMUNITY)
Admission: RE | Admit: 2017-07-07 | Discharge: 2017-07-07 | Disposition: A | Discharge status: Home / Self Care | Payer: Self-pay | Source: Ambulatory Visit | Attending: Cardiology | Admitting: Cardiology

## 2017-07-09 ENCOUNTER — Encounter (HOSPITAL_COMMUNITY)
Admission: RE | Admit: 2017-07-09 | Discharge: 2017-07-09 | Disposition: A | Discharge status: Home / Self Care | Payer: Self-pay | Source: Ambulatory Visit | Attending: Cardiology | Admitting: Cardiology

## 2017-07-12 ENCOUNTER — Encounter (HOSPITAL_COMMUNITY)
Admission: RE | Admit: 2017-07-12 | Discharge: 2017-07-12 | Disposition: A | Discharge status: Home / Self Care | Payer: Self-pay | Source: Ambulatory Visit | Attending: Cardiology | Admitting: Cardiology

## 2017-07-14 ENCOUNTER — Encounter (HOSPITAL_COMMUNITY): Payer: Self-pay

## 2017-07-15 ENCOUNTER — Encounter: Payer: Medicare Other | Admitting: Family Medicine

## 2017-07-16 ENCOUNTER — Encounter (HOSPITAL_COMMUNITY): Payer: Self-pay

## 2017-07-19 ENCOUNTER — Encounter (HOSPITAL_COMMUNITY)
Admission: RE | Admit: 2017-07-19 | Discharge: 2017-07-19 | Disposition: A | Discharge status: Home / Self Care | Payer: Self-pay | Source: Ambulatory Visit | Attending: Cardiology | Admitting: Cardiology

## 2017-07-19 DIAGNOSIS — I2129 ST elevation (STEMI) myocardial infarction involving other sites: Secondary | ICD-10-CM | POA: Insufficient documentation

## 2017-07-21 ENCOUNTER — Encounter (HOSPITAL_COMMUNITY): Payer: Self-pay

## 2017-07-23 ENCOUNTER — Encounter (HOSPITAL_COMMUNITY): Payer: Self-pay

## 2017-07-26 ENCOUNTER — Other Ambulatory Visit: Payer: Self-pay | Admitting: Family Medicine

## 2017-07-26 ENCOUNTER — Encounter (HOSPITAL_COMMUNITY)
Admission: RE | Admit: 2017-07-26 | Discharge: 2017-07-26 | Disposition: A | Discharge status: Home / Self Care | Payer: Self-pay | Source: Ambulatory Visit | Attending: Cardiology | Admitting: Cardiology

## 2017-07-26 NOTE — Telephone Encounter (Signed)
Scheduled for 7/16.

## 2017-07-28 ENCOUNTER — Encounter (HOSPITAL_COMMUNITY): Payer: Self-pay

## 2017-07-29 ENCOUNTER — Telehealth: Payer: Self-pay

## 2017-07-29 NOTE — Telephone Encounter (Signed)
Pt was called to see if we can move his 08-03-17 from 9:15 to 8:45 . LVM for pt to call and confirm. [place]

## 2017-07-30 ENCOUNTER — Encounter (HOSPITAL_COMMUNITY)
Admission: RE | Admit: 2017-07-30 | Discharge: 2017-07-30 | Disposition: A | Discharge status: Home / Self Care | Payer: Self-pay | Source: Ambulatory Visit | Attending: Cardiology | Admitting: Cardiology

## 2017-08-02 ENCOUNTER — Encounter (HOSPITAL_COMMUNITY): Payer: Self-pay

## 2017-08-03 ENCOUNTER — Encounter: Payer: Medicare Other | Admitting: Family Medicine

## 2017-08-04 ENCOUNTER — Encounter (HOSPITAL_COMMUNITY): Payer: Self-pay

## 2017-08-06 ENCOUNTER — Encounter (HOSPITAL_COMMUNITY): Payer: Self-pay

## 2017-08-09 ENCOUNTER — Encounter (HOSPITAL_COMMUNITY)
Admission: RE | Admit: 2017-08-09 | Discharge: 2017-08-09 | Disposition: A | Discharge status: Home / Self Care | Payer: Medicare Other | Source: Ambulatory Visit | Attending: Cardiology | Admitting: Cardiology

## 2017-08-11 ENCOUNTER — Encounter (HOSPITAL_COMMUNITY): Payer: Self-pay

## 2017-08-13 ENCOUNTER — Encounter (HOSPITAL_COMMUNITY)
Admission: RE | Admit: 2017-08-13 | Discharge: 2017-08-13 | Disposition: A | Discharge status: Home / Self Care | Payer: Self-pay | Source: Ambulatory Visit | Attending: Cardiology | Admitting: Cardiology

## 2017-08-16 ENCOUNTER — Encounter (HOSPITAL_COMMUNITY)
Admission: RE | Admit: 2017-08-16 | Discharge: 2017-08-16 | Disposition: A | Discharge status: Home / Self Care | Payer: Self-pay | Source: Ambulatory Visit | Attending: Cardiology | Admitting: Cardiology

## 2017-08-18 ENCOUNTER — Encounter (HOSPITAL_COMMUNITY): Payer: Self-pay

## 2017-08-19 ENCOUNTER — Other Ambulatory Visit: Payer: Self-pay

## 2017-08-19 ENCOUNTER — Ambulatory Visit: Payer: Medicare Other | Admitting: Cardiology

## 2017-08-19 MED ORDER — FUROSEMIDE 40 MG PO TABS: ORAL_TABLET | ORAL | 1 refills | Status: DC

## 2017-08-20 ENCOUNTER — Encounter (HOSPITAL_COMMUNITY): Payer: Self-pay

## 2017-08-20 DIAGNOSIS — I2129 ST elevation (STEMI) myocardial infarction involving other sites: Secondary | ICD-10-CM | POA: Insufficient documentation

## 2017-08-22 NOTE — Progress Notes (Deleted)
   Subjective:    Patient ID: Leonard [name], male    DOB: 1945/03/13, 72 y.o.   MRN: [id number]  HPI No chief complaint on file.     Review of Systems Denies fever, chills, dizziness, chest pain, palpitations, shortness of breath, abdominal pain, N/V/D, urinary symptoms, LE edema.      Objective:   Physical Exam There were no vitals taken for this visit.        Assessment & Plan:  Essential hypertension  Chronic combined systolic and diastolic CHF (congestive heart failure) ([place])  Hyperlipidemia LDL goal <70  Morbid obesity ([place])

## 2017-08-23 ENCOUNTER — Encounter: Payer: Medicare Other | Admitting: Family Medicine

## 2017-08-23 ENCOUNTER — Other Ambulatory Visit: Payer: Self-pay | Admitting: Family Medicine

## 2017-08-23 ENCOUNTER — Encounter (HOSPITAL_COMMUNITY)
Admission: RE | Admit: 2017-08-23 | Discharge: 2017-08-23 | Disposition: A | Discharge status: Home / Self Care | Payer: Medicare Other | Source: Ambulatory Visit | Attending: Cardiology | Admitting: Cardiology

## 2017-08-24 NOTE — Telephone Encounter (Signed)
Pt has an appt on august 23rd

## 2017-08-25 ENCOUNTER — Encounter (HOSPITAL_COMMUNITY): Payer: Self-pay

## 2017-08-27 ENCOUNTER — Encounter (HOSPITAL_COMMUNITY): Payer: Self-pay

## 2017-08-30 ENCOUNTER — Encounter (HOSPITAL_COMMUNITY): Payer: Self-pay

## 2017-09-01 ENCOUNTER — Other Ambulatory Visit: Payer: Self-pay | Admitting: Cardiology

## 2017-09-01 ENCOUNTER — Encounter (HOSPITAL_COMMUNITY): Payer: Self-pay

## 2017-09-01 DIAGNOSIS — I472 Ventricular tachycardia, unspecified: Secondary | ICD-10-CM

## 2017-09-03 ENCOUNTER — Encounter: Payer: Self-pay | Admitting: *Deleted

## 2017-09-03 ENCOUNTER — Encounter: Payer: Medicare Other | Admitting: *Deleted

## 2017-09-03 ENCOUNTER — Encounter (HOSPITAL_COMMUNITY): Payer: Self-pay

## 2017-09-03 DIAGNOSIS — Z006 Encounter for examination for normal comparison and control in clinical research program: Secondary | ICD-10-CM

## 2017-09-03 NOTE — Progress Notes (Signed)
Subject to research clinic for Visit Week 64 in the Galactic HF study.  No cos, aes or saes to report.  Subject did not return 3 boxes for med accountability.  Stated wife packed up boxes and he will look in the drawer for the 3 not returned.  If found will bring with him to Cardiac Rehab and we'll meet with him to pick them up.  Next appointment scheduled.

## 2017-09-06 ENCOUNTER — Encounter (HOSPITAL_COMMUNITY): Payer: Self-pay

## 2017-09-08 ENCOUNTER — Encounter (HOSPITAL_COMMUNITY)
Admission: RE | Admit: 2017-09-08 | Discharge: 2017-09-08 | Disposition: A | Discharge status: Home / Self Care | Payer: Self-pay | Source: Ambulatory Visit | Attending: Cardiology | Admitting: Cardiology

## 2017-09-10 ENCOUNTER — Encounter (HOSPITAL_COMMUNITY): Payer: Self-pay

## 2017-09-10 ENCOUNTER — Encounter: Payer: Medicare Other | Admitting: Family Medicine

## 2017-09-13 ENCOUNTER — Encounter (HOSPITAL_COMMUNITY): Payer: Self-pay

## 2017-09-15 ENCOUNTER — Encounter (HOSPITAL_COMMUNITY): Payer: Self-pay

## 2017-09-17 ENCOUNTER — Encounter (HOSPITAL_COMMUNITY): Payer: Self-pay

## 2017-09-22 ENCOUNTER — Other Ambulatory Visit: Payer: Self-pay | Admitting: Family Medicine

## 2017-09-22 ENCOUNTER — Encounter (HOSPITAL_COMMUNITY): Payer: Self-pay

## 2017-09-22 ENCOUNTER — Encounter: Payer: Self-pay | Admitting: Family Medicine

## 2017-09-22 ENCOUNTER — Ambulatory Visit (INDEPENDENT_AMBULATORY_CARE_PROVIDER_SITE_OTHER): Payer: Medicare Other | Admitting: Family Medicine

## 2017-09-22 ENCOUNTER — Other Ambulatory Visit: Payer: Self-pay | Admitting: Internal Medicine

## 2017-09-22 VITALS — BP 120/70 | HR 102 | Ht 73.0 in | Wt 296.6 lb

## 2017-09-22 DIAGNOSIS — E785 Hyperlipidemia, unspecified: Secondary | ICD-10-CM | POA: Diagnosis not present

## 2017-09-22 DIAGNOSIS — F341 Dysthymic disorder: Secondary | ICD-10-CM

## 2017-09-22 DIAGNOSIS — I10 Essential (primary) hypertension: Secondary | ICD-10-CM

## 2017-09-22 DIAGNOSIS — I5042 Chronic combined systolic (congestive) and diastolic (congestive) heart failure: Secondary | ICD-10-CM | POA: Diagnosis not present

## 2017-09-22 DIAGNOSIS — I2129 ST elevation (STEMI) myocardial infarction involving other sites: Secondary | ICD-10-CM | POA: Insufficient documentation

## 2017-09-22 NOTE — Patient Instructions (Signed)
Your blood pressure today is 120/70. Continue your current medications. We will call you with lab results.   Call and schedule your follow up with Dr. Ellyn Hack and Dr. Caryl Comes.   When you are ready, call and let me know and I will refer you to Dr. Watt Climes at [place].

## 2017-09-22 NOTE — Progress Notes (Signed)
Subjective:    Patient ID: Leonard [name], male    DOB: 07/19/1945, 72 y.o.   MRN: [id number]  HPI Chief Complaint  Patient presents with  . med check    med check.    He is here for a medication check and labs. I am currently only refilling his Effexor for depression and grief.  Depression- death of his daughter over a year ago. Reports having some bad days were he is sad "part of the day". States this has improved some. Reports good compliance with Effexor and would like to continue on this. Reports going to grief counseling in the past. Not currently interested in counseling.   Drinking alcohol and states he has "several glasses of scotch some days". States he is aware that he needs to cut back. Unclear as to how much he is drinking.   He is seeing cardiology for HTN, CHF, CAD.   Other providers: Cardiologist- Dr. Ellyn Hack and Dr. Roselee Nova he has an appointment next month with Dr. Ellyn Hack   Hypertension and CHF- spironolactone and furosemide.  In the [place] study for CHF.  Cardiac rehab 3 times per week.  Denies chest pain, palpitations, shortness of breath, orthopnea, weight gain, LE edema.   States he is able to sleep in his bed on his back without any breathing difficulties and in the past could not do this.   Hyperlipidemia- taking statin daily without any issues.   Reports taking Brilinta and aspirin daily. Denies bleeding.   Unsure of his last colonoscopy. Declines this for now. States he would like to see Dr. Watt Climes when he is ready.   Past Medical History:  Diagnosis Date  . AICD (automatic cardioverter/defibrillator) present 08/26/2015  . Anxiety   . Atherosclerotic heart disease of native coronary artery with angina pectoris ([place]) 07/2015   a. 07/2015 Posterior STEMI/PCI: LM nl, LAd 40ost, RI 40, RCA 116m (3.0x22 Resolute Integrity DES distal, 3.0x12 Resolute Integrity DES prox), 90d (PTCA), RPDA small, nl, RPLB2 100 - too small for PTCA. -- patent in  f/u cath 08/2015 & 05/2016 - patent RCA stents (~10%), RI 40%, LM ~30% ost LAD ~40%.  . Chronic combined systolic and diastolic CHF (congestive heart failure) ([place]) 07/2015   a. 07/2015 Ehco: EF 45-50%, basal-mid inf and infsept AK, inflat, apical inf, and apical septal HK, Gr1 DD, triv MR.;; b - Echo 05/2016 -> EF 20-25%, Gr 2 DD.  Severe diffuse HK disproportionately severe HK and thinning of apex, anterior septum and anterior wall-with septal dyssynchrony.  PAP ~53 mmHg.  [name] Hyperlipidemia with target low density lipoprotein (LDL) cholesterol less than 70 mg/dL   . Hypertensive heart disease   . Ischemic cardiomyopathy 07/2015 - 05/2016   a. 07/2015 Echo: EF 45-50%.; Follow-up Echo 08/23/2015: EF 50-55% with mild inferior hypokinesis --> 05/2016: EF 20-25%, diffuse HK. PAP ~53 mmHg.  . Kidney disease, chronic, stage [name] (GFR 30-59 ml/min) ([place]) 08/12/2015  . Morbid obesity ([place])   . ST elevation myocardial infarction (STEMI) of inferior wall ([place]) 08/12/2015   Subacute presentation for inferior MI with post infarction angina on the following day. EKG still shows injury current. Therefore decided to proceed with intervention of 100% mRCA thrombotic lesion.  . Sustained ventricular fibrillation ([place]) 08/22/2015   Underwent EP evaluation with final ICD implantation.   Past Surgical History:  Procedure Laterality Date  . CARDIAC CATHETERIZATION N/A 08/12/2015   Procedure: Left Heart Cath and Coronary Angiography;  Surgeon: Leonie Man, MD;  Location: [place] [facility];  Service: Cardiovascular: Inferior STEMI: 100% mid RCA, 90% distal RCA, 100% RPL. 40% ostial and proximal LAD and branch of ramus.  [name] CARDIAC CATHETERIZATION N/A 08/26/2015   Procedure: Left Heart Cath and Coronary Angiography;  Surgeon: Belva Crome, MD;  Location: [place] [facility];  Service: Cardiovascular: To evaluate sustained VT. Widely patent RCA stent and distal PTCA site. Also patent RPL branch. LAD lesion appeared to be more  consistent with 50-55% and 40%. Otherwise stable from previous cath. EF estimated 35-45%.  [name] CARDIOVERSION N/A 08/23/2015   Procedure: CARDIOVERSION;  Surgeon: Deboraha Sprang, MD;  Location: [facility];  Service: Cardiovascular;  Laterality: N/A;  . CORONARY ANGIOPLASTY WITH STENT PLACEMENT  08/12/2015   Mid RCA 100% reduced to 0% with 2 overlapping Resolute DES.  3.0 x 22 mm with a 3.0 x 12 mm prox overlarp (postdilated to 3.4 mm); PTCA of dRCA 90%. (Very dificult,complex case - tortuous, [place] RCA - unable to advance longer stents.  RPL2 noted to have thromboembolic 425% occlusion - unable to reach.  . ELECTROPHYSIOLOGIC STUDY N/A 08/26/2015   Procedure: Electrophysiology Study;  Surgeon: Deboraha Sprang, MD;  Location: [place] [facility];  Service: Cardiovascular;  Laterality: N/A;  . EP IMPLANTABLE DEVICE N/A 08/26/2015   Procedure: ICD Implant;  Surgeon: Deboraha Sprang, MD;  Location: [place] [facility];  Service: Cardiovascular;  Laterality: LEFT:  St Jude ICD, serial number [id number].   [name] HEMORRHOID SURGERY    . RIGHT/LEFT HEART CATH AND CORONARY ANGIOGRAPHY N/A 06/10/2016   Procedure: Right/Left Heart Cath and Coronary Angiography;  Surgeon: Martinique, Peter M, MD;  Location: [facility] INVASIVE CV LAB:  Nonobstructive CAD. RCA stents PATENT.  Ost-Prox RCA, 10 %. Ost LM 30 %. Ost LAD 40 %. RI, 40 %; High PCWP  & LVEDP (23 mmHg) ; PAP 43/23/31 mmHg. CO/CI 3.89 / 1/5 (Severely Reduced)  . SKIN SURGERY    . TRANSTHORACIC ECHOCARDIOGRAM  08/13/2015; 08/23/2015   a. Mild concentric LVH. EF 45-50%. Basal-mid inferior and inferoseptal akinesis with hypokinesis of inferolateral and apical inferior wall. 1 DD. ;; b. Technically difficult study. Definity contrast administered.  Compared to a prior echo in 07/2015, the LVEF is slightly higher at 50-55% with mild inferior hypokinesis.  . TRANSTHORACIC ECHOCARDIOGRAM  06/09/2016   EF  20-25%, GR 2 DD. Severe diffuse  hypokinesis with distinct regional wall motion  abnormalities.   There is disproportionately severe hypokinesis and thinning of the apex, anterior septum and anterior wall (septal dyssynchrony). However, there is severe dyssynchrony , making it difficult to assess regional function.  PA P ~53 mmHg  . TRANSTHORACIC ECHOCARDIOGRAM  01/2017   Severely reduced EF.  GI to DD.  Moderate MR.  Mild to have a moderately elevated PAP (peak 33mmHg)   Current Outpatient Medications on File Prior to Visit  Medication Sig Dispense Refill  . aspirin EC 81 MG tablet Take 1 tablet (81 mg total) by mouth daily.    [name] atorvastatin (LIPITOR) 80 MG tablet Take 1 tablet (80 mg total) by mouth daily at 6 PM. 30 tablet 12  . Cholecalciferol (VITAMIN D) 2000 units CAPS Take 1 capsule by mouth daily.    . Coenzyme Q10 (COQ10) 200 MG CAPS Take 200 mg by mouth daily.    [name] DIGOX 125 MCG tablet TAKE 1 TABLET BY MOUTH DAILY 90 tablet 0  . furosemide (LASIX) 40 MG tablet Take 40 mg every morning 90 tablet 1  .  MAGNESIUM-OXIDE 400 (241.3 Mg) MG tablet TAKE 1 TABLET BY MOUTH TWICE DAILY 60 tablet 2  . metoprolol succinate (TOPROL-XL) 50 MG 24 hr tablet Take 50 mg every afternoon 90 tablet 3  . Multiple Vitamin (MULTIVITAMIN) tablet Take 1 tablet by mouth daily.    . nitroGLYCERIN (NITROSTAT) 0.4 MG SL tablet Place 1 tablet (0.4 mg total) under the tongue every 5 (five) minutes as needed for chest pain. 25 tablet 2  . Omega-3 Fatty Acids (FISH OIL) 1200 MG CPDR Take 1 tablet by mouth daily.    . sacubitril-valsartan (ENTRESTO) 49-51 MG Take 1 tablet by mouth 2 (two) times daily. 60 tablet 11  . spironolactone (ALDACTONE) 25 MG tablet Take 1/2 tablet every morning 45 tablet 3  . ticagrelor (BRILINTA) 90 MG TABS tablet Take 1 tablet (90 mg total) by mouth 2 (two) times daily. 60 tablet 12  . Investigational - Study Medication Take 1 tablet by mouth 2 (two) times daily. Study name: Galactic HF Study Additional study details: Omecamtiv Mecarbil or Placebo 14 each PRN   No current  facility-administered medications on file prior to visit.      Reviewed allergies, medications, past medical, surgical, family, and social history.    Review of Systems Pertinent positives and negatives in the history of present illness.     Objective:   Physical Exam BP 120/70   Pulse (!) 102   Ht 6\' 1"  (1.854 m)   Wt 296 lb 9.6 oz (134.5 kg)   BMI 39.13 kg/m   Alert and oriented and in no acute distress.  Pharyngeal area is normal. Neck is supple without adenopathy or thyromegaly. Cardiac exam shows regular rhythm but difficult to hear.  Lungs are clear to auscultation. Extremities without edema, pulses intact. Normal strength.  Skin is warm and dry, no pallor.  PERRLA, conjunctiva normal.       Assessment & Plan:  Essential hypertension - Plan: CBC with Differential/Platelet, Comprehensive metabolic panel  Chronic combined systolic and diastolic CHF (congestive heart failure) ([place]) - Plan: CBC with Differential/Platelet, Comprehensive metabolic panel, Lipid panel  Morbid obesity (HCC) - Plan: CBC with Differential/Platelet, Comprehensive metabolic panel, Lipid panel  Hyperlipidemia LDL goal <70 - Plan: Lipid panel  Dysthymia  Blood pressure is in goal range today.  He will continue on current medication regimen and follow-up with his cardiologist as discussed. Weight stable.  Appears to be doing fairly well physically and emotionally however he does report having some days where he is quite sad about the loss of his daughter particularly when he is doing things that they used to do together such as fantasy football.  States he has been able to talk to other adults who have lost children and has a supportive significant other.  Declines counseling at this time. No thoughts of self harm.  He will continue on Effexor. Advised him to cut back on alcohol use and that this can cause worsening depression and worsening health. He will continue cardiac rehab 3 days/week and  continues in the Galactic heart failure study. Discussed that he is overdue for colonoscopy.  He declines referral at this time.  He would like to give me a call when he is ready to see GI and would like to be referred to Dr. Watt Climes. Hyperlipidemia- continue on statin and check lipids today.  Follow up pending labs or in 6 months.

## 2017-09-23 LAB — LIPID PANEL
CHOL/HDL RATIO: 3.9 ratio (ref 0.0–5.0)
Cholesterol, Total: 170 mg/dL (ref 100–199)
HDL: 44 mg/dL (ref >39)
LDL Calculated: 80 mg/dL (ref 0–99)
Triglycerides: 229 mg/dL — ABNORMAL HIGH (ref 0–149)
VLDL Cholesterol Cal: 46 mg/dL — ABNORMAL HIGH (ref 5–40)

## 2017-09-23 LAB — CBC WITH DIFFERENTIAL/PLATELET
BASOS: 0 %
Basophils Absolute: 0 10*3/uL (ref 0.0–0.2)
EOS (ABSOLUTE): 0.2 10*3/uL (ref 0.0–0.4)
Eos: 2 %
Hematocrit: 48.1 % (ref 37.5–51.0)
Hemoglobin: 16.6 g/dL (ref 13.0–17.7)
IMMATURE GRANS (ABS): 0.1 10*3/uL (ref 0.0–0.1)
Immature Granulocytes: 1 %
Lymphocytes Absolute: 2.1 10*3/uL (ref 0.7–3.1)
Lymphs: 18 %
MCH: 32.9 pg (ref 26.6–33.0)
MCHC: 34.5 g/dL (ref 31.5–35.7)
MCV: 95 fL (ref 79–97)
MONOS ABS: 0.5 10*3/uL (ref 0.1–0.9)
Monocytes: 4 %
NEUTROS PCT: 75 %
Neutrophils Absolute: 8.8 10*3/uL — ABNORMAL HIGH (ref 1.4–7.0)
PLATELETS: 249 10*3/uL (ref 150–450)
RBC: 5.05 x10E6/uL (ref 4.14–5.80)
RDW: 12.8 % (ref 12.3–15.4)
WBC: 11.7 10*3/uL — ABNORMAL HIGH (ref 3.4–10.8)

## 2017-09-23 LAB — COMPREHENSIVE METABOLIC PANEL
ALT: 19 IU/L (ref 0–44)
AST: 30 IU/L (ref 0–40)
Albumin/Globulin Ratio: 1.7 (ref 1.2–2.2)
Albumin: 4.4 g/dL (ref 3.5–4.8)
Alkaline Phosphatase: 100 IU/L (ref 39–117)
BUN/Creatinine Ratio: 11 (ref 10–24)
BUN: 15 mg/dL (ref 8–27)
Bilirubin Total: 0.6 mg/dL (ref 0.0–1.2)
CALCIUM: 9.6 mg/dL (ref 8.6–10.2)
CO2: 21 mmol/L (ref 20–29)
Chloride: 97 mmol/L (ref 96–106)
Creatinine, Ser: 1.37 mg/dL — ABNORMAL HIGH (ref 0.76–1.27)
GFR calc Af Amer: 58 mL/min/{1.73_m2} — ABNORMAL LOW (ref >59)
GFR, EST NON AFRICAN AMERICAN: 50 mL/min/{1.73_m2} — AB (ref >59)
GLUCOSE: 153 mg/dL — AB (ref 65–99)
Globulin, Total: 2.6 g/dL (ref 1.5–4.5)
Potassium: 5.2 mmol/L (ref 3.5–5.2)
Sodium: 139 mmol/L (ref 134–144)
TOTAL PROTEIN: 7 g/dL (ref 6.0–8.5)

## 2017-09-24 ENCOUNTER — Encounter (HOSPITAL_COMMUNITY): Payer: Self-pay

## 2017-09-27 ENCOUNTER — Encounter (HOSPITAL_COMMUNITY): Payer: Self-pay

## 2017-09-29 ENCOUNTER — Encounter (HOSPITAL_COMMUNITY): Payer: Self-pay

## 2017-09-30 ENCOUNTER — Ambulatory Visit (INDEPENDENT_AMBULATORY_CARE_PROVIDER_SITE_OTHER): Payer: Medicare Other | Admitting: *Deleted

## 2017-09-30 DIAGNOSIS — I255 Ischemic cardiomyopathy: Secondary | ICD-10-CM

## 2017-09-30 LAB — CUP PACEART INCLINIC DEVICE CHECK
Battery Remaining Longevity: 82 mo
Brady Statistic RV Percent Paced: 0.16 %
Date Time Interrogation Session: 20190912093137
HighPow Impedance: 72 Ohm
Implantable Lead Implant Date: 20170807
Implantable Lead Location: 753860
Implantable Pulse Generator Implant Date: 20170807
Lead Channel Pacing Threshold Amplitude: 0.5 V
Lead Channel Pacing Threshold Pulse Width: 0.5 ms
Lead Channel Sensing Intrinsic Amplitude: 12 mV
Lead Channel Setting Pacing Amplitude: 2.5 V
MDC IDC MSMT LEADCHNL RV IMPEDANCE VALUE: 400 Ohm
MDC IDC PG SERIAL: 7377761
MDC IDC SET LEADCHNL RV PACING PULSEWIDTH: 0.5 ms
MDC IDC SET LEADCHNL RV SENSING SENSITIVITY: 0.5 mV

## 2017-09-30 NOTE — Progress Notes (Signed)
ICD check in clinic. Normal device function. Thresholds and sensing consistent with previous device measurements. Impedance trends stable over time. 9 NSVT episodes-- longest 8 seconds. Histogram distribution appropriate for patient and level of activity. Optivol stable. No changes made this session. Device programmed at appropriate safety margins. Device programmed to optimize intrinsic conduction. Estimated longevity 6.8 years. Patient education completed including shock plan. ROV with [facility] 01/03/18, ROV with [facility] 03/15/18

## 2017-10-01 ENCOUNTER — Encounter (HOSPITAL_COMMUNITY): Payer: Self-pay

## 2017-10-04 ENCOUNTER — Encounter (HOSPITAL_COMMUNITY): Payer: Self-pay

## 2017-10-06 ENCOUNTER — Telehealth: Payer: Self-pay | Admitting: Internal Medicine

## 2017-10-06 ENCOUNTER — Encounter (HOSPITAL_COMMUNITY): Payer: Self-pay

## 2017-10-06 NOTE — Telephone Encounter (Signed)
Pt called and left a vm on my phone stating he wanted to get a pneumonia when he gets his flu shot. I do not see any documentation on what pneumonia shot he needs. Please advise so I can call pt back to inform him and set it up for nurse visit

## 2017-10-06 NOTE — Telephone Encounter (Signed)
He has had both recommended pneumonia vaccines fairly recently so he does not need a pneumonia vaccine.

## 2017-10-07 NOTE — Telephone Encounter (Signed)
Left message for pt to call back to schedule nurse visit for flu shot

## 2017-10-08 ENCOUNTER — Telehealth (HOSPITAL_COMMUNITY): Payer: Self-pay | Admitting: Cardiac Rehabilitation

## 2017-10-08 ENCOUNTER — Encounter (HOSPITAL_COMMUNITY): Payer: Self-pay

## 2017-10-08 NOTE — Telephone Encounter (Signed)
pc to pt to assess reason for continued absence from cardiac rehab maintenance.  LMOM  Andi Hence, RN, BSN Cardiac Pulmonary Rehab

## 2017-10-09 ENCOUNTER — Other Ambulatory Visit: Payer: Self-pay | Admitting: Internal Medicine

## 2017-10-11 ENCOUNTER — Encounter (HOSPITAL_COMMUNITY)
Admission: RE | Admit: 2017-10-11 | Discharge: 2017-10-11 | Disposition: A | Discharge status: Home / Self Care | Payer: Medicare Other | Source: Ambulatory Visit | Attending: Cardiology | Admitting: Cardiology

## 2017-10-12 ENCOUNTER — Other Ambulatory Visit: Payer: Medicare Other

## 2017-10-13 ENCOUNTER — Encounter (HOSPITAL_COMMUNITY)
Admission: RE | Admit: 2017-10-13 | Discharge: 2017-10-13 | Disposition: A | Discharge status: Home / Self Care | Payer: Self-pay | Source: Ambulatory Visit | Attending: Cardiology | Admitting: Cardiology

## 2017-10-15 ENCOUNTER — Encounter (HOSPITAL_COMMUNITY): Payer: Self-pay

## 2017-10-18 ENCOUNTER — Encounter (HOSPITAL_COMMUNITY): Payer: Self-pay

## 2017-10-18 ENCOUNTER — Other Ambulatory Visit: Payer: Medicare Other

## 2017-10-20 ENCOUNTER — Encounter (HOSPITAL_COMMUNITY): Payer: Self-pay

## 2017-10-20 ENCOUNTER — Other Ambulatory Visit (INDEPENDENT_AMBULATORY_CARE_PROVIDER_SITE_OTHER): Payer: Medicare Other

## 2017-10-20 DIAGNOSIS — I2129 ST elevation (STEMI) myocardial infarction involving other sites: Secondary | ICD-10-CM | POA: Insufficient documentation

## 2017-10-20 DIAGNOSIS — Z23 Encounter for immunization: Secondary | ICD-10-CM

## 2017-10-22 ENCOUNTER — Encounter (HOSPITAL_COMMUNITY): Payer: Self-pay

## 2017-10-25 ENCOUNTER — Encounter (HOSPITAL_COMMUNITY)
Admission: RE | Admit: 2017-10-25 | Discharge: 2017-10-25 | Disposition: A | Discharge status: Home / Self Care | Payer: Self-pay | Source: Ambulatory Visit | Attending: Cardiology | Admitting: Cardiology

## 2017-10-27 ENCOUNTER — Encounter (HOSPITAL_COMMUNITY)
Admission: RE | Admit: 2017-10-27 | Discharge: 2017-10-27 | Disposition: A | Discharge status: Home / Self Care | Payer: Self-pay | Source: Ambulatory Visit | Attending: Cardiology | Admitting: Cardiology

## 2017-10-29 ENCOUNTER — Encounter (HOSPITAL_COMMUNITY): Payer: Self-pay

## 2017-11-01 ENCOUNTER — Other Ambulatory Visit: Payer: Self-pay

## 2017-11-01 ENCOUNTER — Encounter (HOSPITAL_COMMUNITY): Payer: Self-pay

## 2017-11-01 MED ORDER — DIGOXIN 125 MCG PO TABS: 125.0000 ug | ORAL_TABLET | Freq: Every day | ORAL | 0 refills | Status: DC

## 2017-11-03 ENCOUNTER — Encounter (HOSPITAL_COMMUNITY): Payer: Self-pay

## 2017-11-05 ENCOUNTER — Encounter (HOSPITAL_COMMUNITY): Payer: Self-pay

## 2017-11-08 ENCOUNTER — Encounter (HOSPITAL_COMMUNITY): Payer: Self-pay

## 2017-11-10 ENCOUNTER — Encounter (HOSPITAL_COMMUNITY): Payer: Self-pay

## 2017-11-12 ENCOUNTER — Encounter (HOSPITAL_COMMUNITY): Payer: Self-pay

## 2017-11-15 ENCOUNTER — Encounter (HOSPITAL_COMMUNITY)
Admission: RE | Admit: 2017-11-15 | Discharge: 2017-11-15 | Disposition: A | Discharge status: Home / Self Care | Payer: Self-pay | Source: Ambulatory Visit | Attending: Cardiology | Admitting: Cardiology

## 2017-11-17 ENCOUNTER — Encounter (HOSPITAL_COMMUNITY): Payer: Self-pay

## 2017-11-19 ENCOUNTER — Encounter (HOSPITAL_COMMUNITY): Payer: Self-pay

## 2017-11-19 DIAGNOSIS — I10 Essential (primary) hypertension: Secondary | ICD-10-CM | POA: Insufficient documentation

## 2017-11-19 DIAGNOSIS — I255 Ischemic cardiomyopathy: Secondary | ICD-10-CM | POA: Insufficient documentation

## 2017-11-19 DIAGNOSIS — I219 Acute myocardial infarction, unspecified: Secondary | ICD-10-CM | POA: Insufficient documentation

## 2017-11-22 ENCOUNTER — Encounter (HOSPITAL_COMMUNITY)
Admission: RE | Admit: 2017-11-22 | Discharge: 2017-11-22 | Disposition: A | Discharge status: Home / Self Care | Payer: Self-pay | Source: Ambulatory Visit | Attending: Cardiology | Admitting: Cardiology

## 2017-11-24 ENCOUNTER — Encounter (HOSPITAL_COMMUNITY): Payer: Self-pay

## 2017-11-26 ENCOUNTER — Encounter (HOSPITAL_COMMUNITY): Payer: Self-pay

## 2017-11-29 ENCOUNTER — Encounter (HOSPITAL_COMMUNITY)
Admission: RE | Admit: 2017-11-29 | Discharge: 2017-11-29 | Disposition: A | Discharge status: Home / Self Care | Payer: Self-pay | Source: Ambulatory Visit | Attending: Cardiology | Admitting: Cardiology

## 2017-12-01 ENCOUNTER — Encounter (HOSPITAL_COMMUNITY): Payer: Self-pay

## 2017-12-03 ENCOUNTER — Encounter (HOSPITAL_COMMUNITY)
Admission: RE | Admit: 2017-12-03 | Discharge: 2017-12-03 | Disposition: A | Discharge status: Home / Self Care | Payer: Self-pay | Source: Ambulatory Visit | Attending: Cardiology | Admitting: Cardiology

## 2017-12-06 ENCOUNTER — Encounter (HOSPITAL_COMMUNITY): Payer: Self-pay

## 2017-12-08 ENCOUNTER — Encounter (HOSPITAL_COMMUNITY)
Admission: RE | Admit: 2017-12-08 | Discharge: 2017-12-08 | Disposition: A | Discharge status: Home / Self Care | Payer: Self-pay | Source: Ambulatory Visit | Attending: Cardiology | Admitting: Cardiology

## 2017-12-10 ENCOUNTER — Encounter (HOSPITAL_COMMUNITY)
Admission: RE | Admit: 2017-12-10 | Discharge: 2017-12-10 | Disposition: A | Discharge status: Home / Self Care | Payer: Self-pay | Source: Ambulatory Visit | Attending: Cardiology | Admitting: Cardiology

## 2017-12-11 ENCOUNTER — Other Ambulatory Visit: Payer: Self-pay | Admitting: Cardiology

## 2017-12-11 DIAGNOSIS — I472 Ventricular tachycardia, unspecified: Secondary | ICD-10-CM

## 2017-12-13 ENCOUNTER — Encounter (HOSPITAL_COMMUNITY): Payer: Self-pay

## 2017-12-15 ENCOUNTER — Encounter (HOSPITAL_COMMUNITY)
Admission: RE | Admit: 2017-12-15 | Discharge: 2017-12-15 | Disposition: A | Discharge status: Home / Self Care | Payer: Self-pay | Source: Ambulatory Visit | Attending: Cardiology | Admitting: Cardiology

## 2017-12-20 ENCOUNTER — Encounter (HOSPITAL_COMMUNITY)
Admission: RE | Admit: 2017-12-20 | Discharge: 2017-12-20 | Disposition: A | Discharge status: Home / Self Care | Payer: Self-pay | Source: Ambulatory Visit | Attending: Cardiology | Admitting: Cardiology

## 2017-12-20 DIAGNOSIS — I219 Acute myocardial infarction, unspecified: Secondary | ICD-10-CM | POA: Insufficient documentation

## 2017-12-20 DIAGNOSIS — I255 Ischemic cardiomyopathy: Secondary | ICD-10-CM | POA: Insufficient documentation

## 2017-12-20 DIAGNOSIS — I10 Essential (primary) hypertension: Secondary | ICD-10-CM | POA: Insufficient documentation

## 2017-12-22 ENCOUNTER — Encounter (HOSPITAL_COMMUNITY): Payer: Self-pay

## 2017-12-24 ENCOUNTER — Encounter (HOSPITAL_COMMUNITY)
Admission: RE | Admit: 2017-12-24 | Discharge: 2017-12-24 | Disposition: A | Discharge status: Home / Self Care | Payer: Self-pay | Source: Ambulatory Visit | Attending: Cardiology | Admitting: Cardiology

## 2017-12-27 ENCOUNTER — Encounter (HOSPITAL_COMMUNITY): Payer: Self-pay

## 2017-12-27 ENCOUNTER — Encounter: Payer: Medicare Other | Admitting: *Deleted

## 2017-12-27 VITALS — BP 120/78 | HR 88 | Resp 17 | Wt 290.0 lb

## 2017-12-27 DIAGNOSIS — Z006 Encounter for examination for normal comparison and control in clinical research program: Secondary | ICD-10-CM

## 2017-12-27 NOTE — Research (Signed)
Subject to research clinic for visit Wk 80 in the [place] research trial.  No cos, aes or saes to report. Subject 77% compliant with meds and new meds dispensed.  Next appointment scheduled.

## 2017-12-29 ENCOUNTER — Encounter (HOSPITAL_COMMUNITY): Payer: Self-pay

## 2017-12-31 ENCOUNTER — Encounter (HOSPITAL_COMMUNITY): Payer: Self-pay

## 2018-01-03 ENCOUNTER — Encounter (HOSPITAL_COMMUNITY)
Admission: RE | Admit: 2018-01-03 | Discharge: 2018-01-03 | Disposition: A | Discharge status: Home / Self Care | Payer: Self-pay | Source: Ambulatory Visit | Attending: Cardiology | Admitting: Cardiology

## 2018-01-05 ENCOUNTER — Encounter (HOSPITAL_COMMUNITY): Payer: Self-pay

## 2018-01-07 ENCOUNTER — Encounter (HOSPITAL_COMMUNITY): Payer: Self-pay

## 2018-01-10 ENCOUNTER — Encounter (HOSPITAL_COMMUNITY): Payer: Self-pay

## 2018-01-14 ENCOUNTER — Encounter (HOSPITAL_COMMUNITY): Payer: Self-pay

## 2018-01-17 ENCOUNTER — Encounter (HOSPITAL_COMMUNITY): Payer: Self-pay

## 2018-01-21 ENCOUNTER — Encounter (HOSPITAL_COMMUNITY): Payer: Self-pay

## 2018-01-21 DIAGNOSIS — I10 Essential (primary) hypertension: Secondary | ICD-10-CM | POA: Insufficient documentation

## 2018-01-21 DIAGNOSIS — I219 Acute myocardial infarction, unspecified: Secondary | ICD-10-CM | POA: Insufficient documentation

## 2018-01-21 DIAGNOSIS — I255 Ischemic cardiomyopathy: Secondary | ICD-10-CM | POA: Insufficient documentation

## 2018-01-24 ENCOUNTER — Encounter (HOSPITAL_COMMUNITY): Payer: Self-pay

## 2018-01-25 ENCOUNTER — Telehealth: Payer: Self-pay | Admitting: Internal Medicine

## 2018-01-25 NOTE — Telephone Encounter (Signed)
  Patient called to cancel Defib Check with [facility] due to fever and sore throat. Patient needs to reschedule appt.

## 2018-01-26 ENCOUNTER — Encounter (HOSPITAL_COMMUNITY)
Admission: RE | Admit: 2018-01-26 | Discharge: 2018-01-26 | Disposition: A | Discharge status: Home / Self Care | Payer: Self-pay | Source: Ambulatory Visit | Attending: Cardiology | Admitting: Cardiology

## 2018-01-28 ENCOUNTER — Encounter (HOSPITAL_COMMUNITY): Payer: Self-pay

## 2018-01-31 ENCOUNTER — Encounter (HOSPITAL_COMMUNITY): Payer: Self-pay

## 2018-02-02 ENCOUNTER — Encounter (HOSPITAL_COMMUNITY): Payer: Self-pay

## 2018-02-04 ENCOUNTER — Encounter (HOSPITAL_COMMUNITY): Payer: Self-pay

## 2018-02-04 ENCOUNTER — Other Ambulatory Visit: Payer: Self-pay | Admitting: Cardiology

## 2018-02-07 ENCOUNTER — Encounter (HOSPITAL_COMMUNITY): Payer: Self-pay

## 2018-02-09 ENCOUNTER — Encounter (HOSPITAL_COMMUNITY): Payer: Self-pay

## 2018-02-11 ENCOUNTER — Encounter (HOSPITAL_COMMUNITY): Payer: Self-pay

## 2018-02-14 ENCOUNTER — Encounter (HOSPITAL_COMMUNITY): Payer: Self-pay

## 2018-02-16 ENCOUNTER — Encounter (HOSPITAL_COMMUNITY): Payer: Self-pay

## 2018-02-18 ENCOUNTER — Encounter (HOSPITAL_COMMUNITY): Payer: Self-pay

## 2018-02-18 ENCOUNTER — Other Ambulatory Visit: Payer: Self-pay | Admitting: Internal Medicine

## 2018-02-21 ENCOUNTER — Encounter (HOSPITAL_COMMUNITY): Payer: Self-pay

## 2018-02-21 DIAGNOSIS — I255 Ischemic cardiomyopathy: Secondary | ICD-10-CM | POA: Insufficient documentation

## 2018-02-21 DIAGNOSIS — I10 Essential (primary) hypertension: Secondary | ICD-10-CM | POA: Insufficient documentation

## 2018-02-21 DIAGNOSIS — I219 Acute myocardial infarction, unspecified: Secondary | ICD-10-CM | POA: Insufficient documentation

## 2018-02-22 ENCOUNTER — Encounter: Payer: Self-pay | Admitting: Internal Medicine

## 2018-02-23 ENCOUNTER — Encounter (HOSPITAL_COMMUNITY): Payer: Self-pay

## 2018-02-25 ENCOUNTER — Encounter (HOSPITAL_COMMUNITY): Payer: Self-pay

## 2018-02-28 ENCOUNTER — Encounter (HOSPITAL_COMMUNITY): Payer: Self-pay

## 2018-03-01 ENCOUNTER — Other Ambulatory Visit: Payer: Self-pay | Admitting: Cardiology

## 2018-03-02 ENCOUNTER — Encounter (HOSPITAL_COMMUNITY): Payer: Self-pay

## 2018-03-04 ENCOUNTER — Encounter (HOSPITAL_COMMUNITY): Payer: Self-pay

## 2018-03-07 ENCOUNTER — Encounter (HOSPITAL_COMMUNITY)
Admission: RE | Admit: 2018-03-07 | Discharge: 2018-03-07 | Disposition: A | Discharge status: Home / Self Care | Payer: Self-pay | Source: Ambulatory Visit | Attending: Cardiology | Admitting: Cardiology

## 2018-03-09 ENCOUNTER — Encounter (HOSPITAL_COMMUNITY): Payer: Self-pay

## 2018-03-11 ENCOUNTER — Encounter (HOSPITAL_COMMUNITY): Payer: Self-pay

## 2018-03-14 ENCOUNTER — Encounter (HOSPITAL_COMMUNITY): Payer: Self-pay

## 2018-03-15 ENCOUNTER — Encounter: Payer: Medicare Other | Admitting: Internal Medicine

## 2018-03-16 ENCOUNTER — Encounter (HOSPITAL_COMMUNITY): Payer: Self-pay

## 2018-03-18 ENCOUNTER — Other Ambulatory Visit: Payer: Self-pay | Admitting: Internal Medicine

## 2018-03-18 ENCOUNTER — Encounter (HOSPITAL_COMMUNITY): Payer: Self-pay

## 2018-03-21 ENCOUNTER — Other Ambulatory Visit: Payer: Self-pay | Admitting: Internal Medicine

## 2018-03-21 ENCOUNTER — Encounter (HOSPITAL_COMMUNITY): Payer: Self-pay | Attending: Cardiology

## 2018-03-21 ENCOUNTER — Other Ambulatory Visit: Payer: Self-pay | Admitting: Family Medicine

## 2018-03-21 DIAGNOSIS — I255 Ischemic cardiomyopathy: Secondary | ICD-10-CM | POA: Insufficient documentation

## 2018-03-21 DIAGNOSIS — I219 Acute myocardial infarction, unspecified: Secondary | ICD-10-CM | POA: Insufficient documentation

## 2018-03-21 DIAGNOSIS — I10 Essential (primary) hypertension: Secondary | ICD-10-CM | POA: Insufficient documentation

## 2018-03-21 NOTE — Telephone Encounter (Signed)
Is this okay to refill? 

## 2018-03-21 NOTE — Telephone Encounter (Signed)
Pt has an appt in April, will not refill again until appt

## 2018-03-21 NOTE — Telephone Encounter (Signed)
Ok to give him refills until his visit.

## 2018-03-23 ENCOUNTER — Other Ambulatory Visit: Payer: Self-pay | Admitting: Cardiology

## 2018-03-23 ENCOUNTER — Encounter (HOSPITAL_COMMUNITY): Payer: Self-pay

## 2018-03-23 ENCOUNTER — Ambulatory Visit: Payer: Medicare Other | Admitting: Family Medicine

## 2018-03-25 ENCOUNTER — Encounter (HOSPITAL_COMMUNITY): Payer: Self-pay

## 2018-03-28 ENCOUNTER — Encounter (HOSPITAL_COMMUNITY): Payer: Self-pay

## 2018-03-30 ENCOUNTER — Encounter (HOSPITAL_COMMUNITY): Payer: Self-pay

## 2018-04-01 ENCOUNTER — Encounter (HOSPITAL_COMMUNITY): Payer: Self-pay

## 2018-04-01 ENCOUNTER — Other Ambulatory Visit: Payer: Self-pay | Admitting: Cardiology

## 2018-04-01 NOTE — Telephone Encounter (Signed)
Rx(s) sent to pharmacy electronically.  

## 2018-04-04 ENCOUNTER — Telehealth (HOSPITAL_COMMUNITY): Payer: Self-pay | Admitting: *Deleted

## 2018-04-04 ENCOUNTER — Encounter (HOSPITAL_COMMUNITY): Payer: Self-pay

## 2018-04-04 NOTE — Telephone Encounter (Signed)
Contacted patient to notify of Cardiac Rehab department closure x2 weeks. Message left on patient's voicemail. Seward Carol, Exercise Physiologist Cardiac and Pulmonary Rehabilitaiton

## 2018-04-06 ENCOUNTER — Encounter (HOSPITAL_COMMUNITY): Payer: Self-pay

## 2018-04-08 ENCOUNTER — Encounter (HOSPITAL_COMMUNITY): Payer: Self-pay

## 2018-04-11 ENCOUNTER — Encounter (HOSPITAL_COMMUNITY): Payer: Self-pay

## 2018-04-11 ENCOUNTER — Telehealth (HOSPITAL_COMMUNITY): Payer: Self-pay | Admitting: Family Medicine

## 2018-04-13 ENCOUNTER — Encounter (HOSPITAL_COMMUNITY): Payer: Self-pay

## 2018-04-13 ENCOUNTER — Telehealth: Payer: Self-pay | Admitting: *Deleted

## 2018-04-13 ENCOUNTER — Encounter: Payer: Medicare Other | Admitting: Internal Medicine

## 2018-04-13 NOTE — Telephone Encounter (Signed)
Called to let patient know that we are not seeing clinic patients at this time and to make alternate plans to ensure that he had enough study medication.  Left message and cell phone number for him to return the call.

## 2018-04-14 ENCOUNTER — Encounter: Payer: Medicare Other | Admitting: *Deleted

## 2018-04-15 ENCOUNTER — Encounter: Payer: Self-pay | Admitting: *Deleted

## 2018-04-15 ENCOUNTER — Encounter (HOSPITAL_COMMUNITY): Payer: Self-pay

## 2018-04-15 DIAGNOSIS — Z006 Encounter for examination for normal comparison and control in clinical research program: Secondary | ICD-10-CM

## 2018-04-15 NOTE — Research (Unsigned)
Subject received study medication at curbside pickup.  Next study visit scheduled.

## 2018-04-15 NOTE — Research (Signed)
Subject received IP via curbside pickup as approved by sponsor.

## 2018-04-18 ENCOUNTER — Encounter (HOSPITAL_COMMUNITY): Payer: Self-pay

## 2018-04-20 ENCOUNTER — Ambulatory Visit: Payer: Medicare Other | Admitting: Family Medicine

## 2018-04-20 ENCOUNTER — Other Ambulatory Visit: Payer: Self-pay | Admitting: Internal Medicine

## 2018-04-20 ENCOUNTER — Encounter (HOSPITAL_COMMUNITY): Payer: Self-pay

## 2018-04-22 ENCOUNTER — Other Ambulatory Visit: Payer: Self-pay | Admitting: Cardiology

## 2018-04-22 ENCOUNTER — Encounter (HOSPITAL_COMMUNITY): Payer: Self-pay

## 2018-04-22 DIAGNOSIS — I472 Ventricular tachycardia, unspecified: Secondary | ICD-10-CM

## 2018-04-22 NOTE — Telephone Encounter (Signed)
Mag OX refilled.

## 2018-04-25 ENCOUNTER — Encounter (HOSPITAL_COMMUNITY): Payer: Self-pay

## 2018-04-27 ENCOUNTER — Encounter (HOSPITAL_COMMUNITY): Payer: Self-pay

## 2018-04-28 ENCOUNTER — Telehealth (HOSPITAL_COMMUNITY): Payer: Self-pay | Admitting: Cardiac Rehabilitation

## 2018-04-28 NOTE — Telephone Encounter (Signed)
Pt phone call to inform of continued Outpatient Cardiac Rehab departmental closure for COVID 19 precautions. Future opening date to be determined. Pt instructed to continue exercising on his own following home exercise guidelines.Heis continuing to walk on [place].Pt advised to contact cardiology or PCP PRN symptoms, questions or concerns. Understanding verbalized. Pt denies food insecurity or other needs at this time. Pt offered emotional support and understanding. Pt expressed gratitude for the call and is interested in resuming CRPII when available.  Andi Hence, RN, BSN Cardiac Pulmonary Rehab

## 2018-04-29 ENCOUNTER — Encounter (HOSPITAL_COMMUNITY): Payer: Self-pay

## 2018-05-02 ENCOUNTER — Encounter (HOSPITAL_COMMUNITY): Payer: Self-pay

## 2018-05-04 ENCOUNTER — Encounter (HOSPITAL_COMMUNITY): Payer: Self-pay

## 2018-05-06 ENCOUNTER — Encounter (HOSPITAL_COMMUNITY): Payer: Self-pay

## 2018-05-09 ENCOUNTER — Encounter: Payer: Medicare Other | Admitting: Internal Medicine

## 2018-05-09 ENCOUNTER — Encounter (HOSPITAL_COMMUNITY): Payer: Self-pay

## 2018-05-11 ENCOUNTER — Encounter (HOSPITAL_COMMUNITY): Payer: Self-pay

## 2018-05-13 ENCOUNTER — Encounter (HOSPITAL_COMMUNITY): Payer: Self-pay

## 2018-05-16 ENCOUNTER — Encounter (HOSPITAL_COMMUNITY): Payer: Self-pay

## 2018-05-18 ENCOUNTER — Other Ambulatory Visit: Payer: Self-pay | Admitting: Family Medicine

## 2018-05-18 ENCOUNTER — Other Ambulatory Visit: Payer: Self-pay | Admitting: Internal Medicine

## 2018-05-18 ENCOUNTER — Encounter (HOSPITAL_COMMUNITY): Payer: Self-pay

## 2018-05-18 NOTE — Telephone Encounter (Signed)
Left detailed message that vickie ok'ed 30 days only for pt but he will need an virtual or inperson med check

## 2018-06-14 ENCOUNTER — Other Ambulatory Visit: Payer: Self-pay

## 2018-06-14 ENCOUNTER — Encounter: Payer: Self-pay | Admitting: Family Medicine

## 2018-06-14 ENCOUNTER — Ambulatory Visit (INDEPENDENT_AMBULATORY_CARE_PROVIDER_SITE_OTHER): Payer: Medicare Other | Admitting: Family Medicine

## 2018-06-14 VITALS — BP 118/68 | HR 88 | Temp 98.4°F | Ht 74.0 in | Wt 292.0 lb

## 2018-06-14 DIAGNOSIS — Z23 Encounter for immunization: Secondary | ICD-10-CM

## 2018-06-14 DIAGNOSIS — Z7189 Other specified counseling: Secondary | ICD-10-CM

## 2018-06-14 DIAGNOSIS — F329 Major depressive disorder, single episode, unspecified: Secondary | ICD-10-CM | POA: Diagnosis not present

## 2018-06-14 DIAGNOSIS — Z0000 Encounter for general adult medical examination without abnormal findings: Secondary | ICD-10-CM | POA: Diagnosis not present

## 2018-06-14 DIAGNOSIS — I10 Essential (primary) hypertension: Secondary | ICD-10-CM

## 2018-06-14 DIAGNOSIS — F32A Depression, unspecified: Secondary | ICD-10-CM

## 2018-06-14 DIAGNOSIS — Z7185 Encounter for immunization safety counseling: Secondary | ICD-10-CM

## 2018-06-14 NOTE — Patient Instructions (Addendum)
MEDICARE PREVENTATIVE SERVICES (MALE) AND PERSONALIZED PLAN for  Leonard [name] Jun 14, 2018  CONDITIONS OR RISKS IDENTIFIED TODAY: Overdue for colonoscopy. (May do Cologuard instead, read below)   SPECIFIC RECOMMENDATIONS: Call and schedule a visit with Dr. Watt Climes as we discussed to talk about a colonoscopy.  If you decide against this and you are of average risk for colon cancer, then you may be eligible for a Cologuard test which is a stool test that is done at home and mailed. I can order this for you if you would like.   You are getting a shingles vaccine on 06/15/18. As we discussed, this vaccine may make your arm sore and you may not feel your usual self for a day or two.  You will need the 2nd vaccine in 1-2 months.   I recommend that you follow up with your cardiologist. Your last blood work was done in 09/2017  Influenza vaccine: will be due in August 2020 Pneumococcal vaccine: you are up to date with both Prevnar 13 and Pneumovax 23.  Shingles vaccine: Zostavax (the old one) in 2014. Shingrix to be done Tdap vaccine: up to date- 2017 Colonoscopy: last one unknown date   Return in 6 months for a medication management visit.    GENERAL RECOMMENDATIONS FOR GOOD HEALTH:  Supplements:  . Take a daily baby Aspirin 81mg  at bedtime for heart health unless you have a history of gastrointestinal bleed, allergy to aspirin, or are already taking higher dose Aspirin or other antiplatelet or blood thinner medication.   . Consume 1200 mg of Calcium daily through dietary calcium or supplement if you are male age 75 or older, or men 18 and older.   Men aged 55-70 should consume 1000 mg of Calcium daily. . Take 600 IU of Vitamin D daily.  Take 800 IU of Calcium daily if you are older than age 75.  . Take a general multivitamin daily.   Healthy diet: Eat a variety of foods, including fruits, vegetables, vegetable protein such as beans, lentils, tofu, and grains, such as rice.  Limit  meat or animal protein, but if you eat meat, choose leans cuts such as chicken, fish, or [place].  Drink plenty of water daily.  Decrease saturated fat in the diet, avoid lots of red meat, processed foods, sweets, fast foods, and fried foods.  Limit salt and caffeine intake.  Exercise: Aerobic exercise helps maintain good heart health. Weight bearing exercise helps keep bones and muscles working strong.  We recommend at least 30-40 minutes of exercise most days of the week.   Fall prevention: Falls are the leading cause of injuries, accidents, and accidental deaths in people over the age of 79. Falling is a real threat to your ability to live on your own.  Causes include poor eyesight or poor hearing, illness, poor lighting, throw rugs, clutter in your home, and medication side effects causing dizziness or balance problems.  Such medications can include medications for depression, sleep problems, high blood pressure, diabetes, and heart conditions.   PREVENTION  Be sure your home is as safe as possible. Here are some tips:  Wear shoes with non-skid soles (not house slippers).   Be sure your home and outside area are well lit.   Use night lights throughout your house, including hallways and stairways.   Remove clutter and clean up spills on floors and walkways.   Remove throw rugs or fasten them to the floor with carpet tape. Tack down carpet edges.  Do not place electrical cords across pathways.   Install grab bars in your bathtub, shower, and toilet area. Towel bars should not be used as a grab bar.   Install handrails on both sides of stairways.   Do not climb on stools or stepladders. Get someone else to help with jobs that require climbing.   Do not wax your floors at all, or use a non-skid wax.   Repair uneven or unsafe sidewalks, walkways or stairs.   Keep frequently used items within reach.   Be aware of pets so you do not trip.  Get regular check-ups from your doctor, and  take good care of yourself:  Have your eyes checked every year for vision changes, cataracts, glaucoma, and other eye problems. Wear eyeglasses as directed.   Have your hearing checked every 2 years, or anytime you or others think that you cannot hear well. Use hearing aids as directed.   See your caregiver if you have foot pain or corns. Sore feet can contribute to falls.   Let your caregiver know if a medicine is making you feel dizzy or making you lose your balance.   Use a cane, walker, or wheelchair as directed. Use walker or wheelchair brakes when getting in and out.   When you get up from bed, sit on the side of the bed for 1 to 2 minutes before you stand up. This will give your blood pressure time to adjust, and you will feel less dizzy.   If you need to go to the bathroom often, consider using a bedside commode.  Disease prevention:  If you smoke or chew tobacco, find out from your caregiver how to quit. It can literally save your life, no matter how long you have been a tobacco user. If you do not use tobacco, never begin. Medicare does cover some smoking cessation counseling.  Maintain a healthy diet and normal weight. Increased weight leads to problems with blood pressure and diabetes. We check your height, weight, and BMI as part of your yearly visit.  The Body Mass Index or BMI is a way of measuring how much of your body is fat. Having a BMI above 27 increases the risk of heart disease, diabetes, hypertension, stroke and other problems related to obesity. Your caregiver can help determine your BMI and based on it develop an exercise and dietary program to help you achieve or maintain this important measurement at a healthful level.  High blood pressure causes heart and blood vessel problems.  Persistent high blood pressure should be treated with medicine if weight loss and exercise do not work.  We check your blood pressure as part of your yearly visit.  Avoid drinking alcohol  in excess (more than two drinks per day).  Avoid use of street drugs. Do not share needles with anyone. Ask for professional help if you need assistance or instructions on stopping the use of alcohol, cigarettes, and/or drugs.  Brush your teeth twice a day with fluoride toothpaste, and floss once a day. Good oral hygiene prevents tooth decay and gum disease. The problems can be painful, unattractive, and can cause other health problems. Visit your dentist for a routine oral and dental checkup and preventive care every 6-12 months.   See your eye doctor yearly for routine screening for things like glaucoma.  Look at your skin regularly.  Use a mirror to look at your back. Notify your caregivers of changes in moles, especially if there are changes in shapes,  colors, a size larger than a pencil eraser, an irregular border, or development of new moles.  Safety:  Use seatbelts 100% of the time, whether driving or as a passenger.  Use safety devices such as hearing protection if you work in environments with loud noise or significant background noise.  Use safety glasses when doing any work that could send debris in to the eyes.  Use a helmet if you ride a bike or motorcycle.  Use appropriate safety gear for contact sports.  Talk to your caregiver about gun safety.  Use sunscreen with a SPF (or skin protection factor) of 15 or greater.  Lighter skinned people are at a greater risk of skin cancer. Don't forget to also wear sunglasses in order to protect your eyes from too much damaging sunlight. Damaging sunlight can accelerate cataract formation.   If you have multiple sexual partners, or if you are not in a monogamous relationship, practice safe sex. Use condoms. Condoms are used to help reduce the spread of sexually transmitted infections (or STIs).  Consider an HIV test if you have never been tested.  Consider routine screening for STIs if you have multiple sexual partners.   Keep carbon monoxide and  smoke detectors in your home functioning at all times. Change the batteries every 6 months or use a model that plugs into the wall or is hard wired in.   END OF LIFE PLANNING/ADVANCED DIRECTIVES Advance health-care planning is deciding the kind of care you want at the end of life. While alert competent adults are able to exercise their rights to make health care and financial decisions, problems arise when an individual becomes unconscious, incapacitated, or otherwise unable to communicate or make such decisions. Advance health care directives are the legal documents in which you give written instructions about your choices limited, aggressive or palliative care if, in the future, you cannot speak for yourself.  Advanced directives include the following: [place] allows you to appoint someone to act as your health care agent to make health care decisions for you should it be determined by your health care provider that you are no longer able to make these decisions for yourself.  A Living Will is a legal document in which you can declare that under certain conditions you desire your life not be prolonged by extraordinary or artificial means during your last illness or when you are near death. We can provide you with sample advanced directives, you can get an attorney to prepare these for you, or you can visit [place] Secretary of State's website for additional information and resources at [email or web address]  Further, I recommend you have an attorney prepare a Will and Durable Power of Attorney if you haven't done so already.  Please get [place] a copy of your health care Advanced Directives.   PREVENTATIV E CARE RECOMMENDATIONS:  Vaccinations: We recommend the following vaccinations as part of your preventative care:  Pneumococcal vaccine is recommended to protect against certain types of pneumonia.  This is normally recommended for adults age 61 or older once, or  up to every 5 years for those at high risk.  The vaccine is also recommended for adults younger than 75 years old with certain underlying conditions that make them high risk for pneumonia.  Influenza vaccine is recommended to protect against seasonal influenza or "the flu." Influenza is a serious disease that can lead to hospitalization and sometimes even death. Traditional flu vaccines (called trivalent vaccines) are made to  protect against three flu viruses; an influenza A (H1N1) virus, an influenza A (H3N2) virus, and an influenza B virus. In addition, there are flu vaccines made to protect against four flu viruses (called "quadrivalent" vaccines). These vaccines protect against the same viruses as the trivalent vaccine and an additional B virus.  We recommend the high dose influenza vaccine to those 65 years and older.  Hepatitis B vaccine to protect against a form of infection of the liver by a virus acquired from blood or body fluids, particularly for high risk groups.  Td or Tdap vaccine to protect against Tetanus, diphtheria and pertussis which can be very serious.  These diseases are caused by bacteria.  Diphtheria and pertussis are spread from person to person through coughing or sneezing.  Tetanus enters the body through cuts, scratches, or wounds.  Tetanus (Lockjaw) causes painful muscle tightening and stiffness, usually all over the body.  Diphtheria can cause a thick coating to form in the back of the throat.  It can lead to breathing problems, paralysis, heart failure, and death.  Pertussis (Whooping Cough) causes severe coughing spells, which can cause difficulty breathing, vomiting and disturbed sleep.  Td or Tdap is usually given every 10 years.  Shingles vaccine to protect against Varicella Zoster if you are older than age 72, or younger than 75 years old with certain underlying illness.    Cancer Screening: Most routine colon cancer screening begins at the age of 50.  Subsequent  colonoscopies are performed either every 5-10 years for normal screening, or every 2-5 years for higher risks patients, up until age 42 years of age. Annual screening is done with easy to use take-home tests to check for hidden blood in the stool called hemoccult tests.  Sigmoidoscopy or colonoscopy can detect the earliest forms of colon cancer and is life saving. These tests use a small camera at the end of a tube to directly examine the colon.   Prostate cancer screening usually begins at age 84 years old, or younger age for those with higher risk.  Those at higher risk include African-Americans or having a family history of prostate cancer. There are two types of tests for prostate cancer - Prostate-specific antigen (PSA) testing. Recent studies raise questions about prostate cancer using PSA and you should discuss this with your caregiver.  The other type of test is the digital rectal exam (in which your doctor's lubricated and gloved finger feels for enlargement of the prostate through the anus).  We routinely stop testing at age 57 years of age.  Osteoporosis Screening: Screening for osteoporosis usually begins at age 71 for women, and can be done as frequent as every 2 years.  However, women or men with higher risk of osteoporosis may be screened earlier than age 69.  Osteoporosis or low bone mass is diminished bone strength from alterations in bone architecture leading to bone fragility and increased fracture risk.     Cardiovascular Screening: Fat and cholesterol leaves deposits in your arteries that can block them. This causes heart disease and vessel disease elsewhere in your body.  If your cholesterol is found to be high, or if you have heart disease or certain other medical conditions, then you may need to have your cholesterol monitored frequently and be treated with medication. Cardiovascular screening in the form of lab tests for cholesterol, HDL and triglycerides can be done every 5 years.   A screening electrocardiogram can be done as part of the Welcome to Medicare physical.  Diabetes Screening: Diabetes screening can be done at least every 3 years for those with risk factors,  or every 6-16months for prediabetic patients.  Screening includes fasting blood sugar test or glucose tolerance test.  Risk factors include hypertension, dyslipidemia, obesity, previously abnormal glucose tests, family history of diabetes, age 67 years or older, and history of gestations diabetes.   AAA (abdominal aortic aneurysm) Screening: Medicare allows for a one time ultrasound to screen for abdominal aortic aneurysm if done as a referral as part of the Welcome to Medicare exam.  Men eligible for this screening include those men between age 35-15 years of age who have smoked at least 100 cigarettes in his lifetime and/or has a family history of AAA.  HIV Screening:  Medicare allows for yearly screening for patients at high risk for contracting HIV disease.

## 2018-06-14 NOTE — Progress Notes (Addendum)
Leonard [name] is a 73 y.o. male who is due for an annual wellness visit and follow-up on chronic medical conditions.   Documentation for virtual telephone encounter. He denies having video capability.   The patient was located at home. The provider was located in the office. The patient did consent to this visit and is aware of possible charges through their insurance for this visit.  The other persons participating in this telemedicine service were none.   He has the following concerns:  He would like to get a Shingrix vaccine. States he is not concerned about how much it costs. Does not want to check with insurance.   States his mood is fine. Taking Effexor daily. No concerns.   Followed by cardiology for HTN, CAD, CHF.  Denies significant weight gain.    Immunization History  Administered Date(s) Administered  . Influenza, High Dose Seasonal PF 10/14/2015, 10/06/2016, 10/20/2017  . Influenza-Unspecified 10/20/2014  . Pneumococcal Conjugate-13 10/06/2016  . Pneumococcal Polysaccharide-23 07/21/2012, 08/13/2015  . Td 05/20/2007, 08/26/2010  . Tdap 02/12/2015  . Zoster 07/21/2012   Last colonoscopy: unknown  Last PSA: several years ago. Has seen urology.  Reports having a biopsy which was negative. Declines this test today.  Dentist: Dr. Mariel Sleet Ophtho: Dr. Ellie Lunch Exercise: was doing cardiac rehab  Other doctors caring for patient include: Dr. Ellyn Hack- Cardio Dr. Caryl Comes- Cardiology  GI- plans to see Dr. Watt Climes    Depression screen:  See questionnaire below.     Depression screen [facility] 2/9 06/14/2018 09/22/2017  Decreased Interest 0 0  Down, Depressed, Hopeless 0 0  PHQ - 2 Score 0 0  Some recent data might be hidden    Fall Screen: See Questionaire below.   Fall Risk  06/14/2018 09/22/2017 10/14/2015 10/10/2015 10/10/2015  Falls in the past year? 0 No Yes No -  Number falls in past yr: 0 - - - -  Injury with Fall? 0 - Yes - -  Risk Factor Category  - - High Fall  Risk - -  Risk for fall due to : - - Other (Comment) Impaired balance/gait Impaired balance/gait;Impaired mobility  Risk for fall due to: Comment - - fell last thursday as he tripped over step - -    ADL screen:  See questionnaire below.  Functional Status Survey: Is the patient deaf or have difficulty hearing?: No Does the patient have difficulty seeing, even when wearing glasses/contacts?: No Does the patient have difficulty concentrating, remembering, or making decisions?: No Does the patient have difficulty walking or climbing stairs?: No Does the patient have difficulty dressing or bathing?: No Does the patient have difficulty doing errands alone such as visiting a doctor's office or shopping?: No   End of Life Discussion:  Patient has a living will and medical power of attorney   Review of Systems  Constitutional: -fever, -chills, -sweats, -unexpected weight change, -anorexia, -fatigue Allergy: -sneezing, -itching, -congestion Dermatology: denies changing moles, rash, lumps, new worrisome lesions ENT: -runny nose, -ear pain, -sore throat, -hoarseness, -sinus pain, -teeth pain, -tinnitus, -hearing loss, -epistaxis Cardiology:  -chest pain, -palpitations, -edema, -orthopnea, -paroxysmal nocturnal dyspnea Respiratory: -cough, -shortness of breath, -dyspnea on exertion, -wheezing, -hemoptysis Gastroenterology: -abdominal pain, -nausea, -vomiting, -diarrhea, -constipation, -blood in stool, -changes in bowel movement, -dysphagia Hematology: -bleeding or bruising problems Musculoskeletal: -arthralgias, -myalgias, -joint swelling, -back pain, -neck pain, -cramping, -gait changes Ophthalmology: -vision changes, -eye redness, -itching, -discharge Urology: -dysuria, -difficulty urinating, -hematuria, -urinary frequency, -urgency, incontinence Neurology: -headache, -weakness, -tingling, -numbness, -speech abnormality, -  memory loss, -falls, -dizziness Psychology:  -depressed mood,  -agitation, -sleep problems   PHYSICAL EXAM:  BP 118/68   Pulse 88   Temp 98.4 F (36.9 C) (Oral)   Ht 6\' 2"  (1.88 m)   Wt 292 lb (132.5 kg)   BMI 37.49 kg/m   Alert and oriented and in no acute distress. Speaking in complete sentences without difficulty. Normal speech, mood and thought process. Unable to perform physical exam due to this being a physical exam.   ASSESSMENT/PLAN: Medicare annual wellness visit, subsequent  Essential hypertension  Morbid obesity ([place])  Depression, unspecified depression type  He appears to be doing well.  No concerns with memory, ability to care for himself or with mood.  Reports doing well on Effexor and will continue.  Immunization counseling done. He will come in for a lab visit to get the Shingrix vaccine. Counseling on potential side effects.  He is hoping to be able to start back with cardiac rehab soon. Has not been able to do this since the pandemic. States he will see cardiology soon. He is followed for HTN, CHF, CAD.  BP seems to be in goal range.  Counseling on healthy diet.  Advance directives discussed and he reports having these.  Recommend that he come in for labs and he declines.  Discussed that he has not had a PSA in several years. He declines this test. States he was cleared by urology in the past.  Discussed that he appears to be due for colonoscopy. He plans to call and schedule with Dr. Watt Climes at [place].  Follow up in 6 months.   Time spent on call was 19minutes and in review of previous records 5 minutes total.  This virtual service is not related to other E/M service within previous 7 days.   Discussed PSA screening (risks/benefits), recommended at least 30 minutes of aerobic activity at least 5 days/week; proper sunscreen use reviewed; healthy diet and alcohol recommendations (less than or equal to 2 drinks/day) reviewed; regular seatbelt use; changing batteries in smoke detectors. Immunization recommendations  discussed.  Colonoscopy recommendations reviewed.   Medicare Attestation I have personally reviewed: The patient's medical and social history Their use of alcohol, tobacco or illicit drugs Their current medications and supplements The patient's functional ability including ADLs,fall risks, home safety risks, cognitive, and hearing and visual impairment Diet and physical activities Evidence for depression or mood disorders  The patient's weight, height, and BMI have been recorded in the chart.  I have made referrals, counseling, and provided education to the patient based on review of the above and I have provided the patient with a written personalized care plan for preventive services.     [name], NP-C   06/14/2018

## 2018-06-16 ENCOUNTER — Other Ambulatory Visit: Payer: Medicare Other

## 2018-06-19 ENCOUNTER — Other Ambulatory Visit: Payer: Self-pay | Admitting: Family Medicine

## 2018-06-20 NOTE — Telephone Encounter (Signed)
Is this okay to refill? 

## 2018-06-20 NOTE — Telephone Encounter (Signed)
Ok to refill for 6 months. Thanks.

## 2018-06-22 ENCOUNTER — Other Ambulatory Visit (INDEPENDENT_AMBULATORY_CARE_PROVIDER_SITE_OTHER): Payer: Medicare Other

## 2018-06-22 ENCOUNTER — Other Ambulatory Visit: Payer: Self-pay

## 2018-06-22 DIAGNOSIS — Z23 Encounter for immunization: Secondary | ICD-10-CM

## 2018-06-27 ENCOUNTER — Other Ambulatory Visit: Payer: Self-pay | Admitting: Internal Medicine

## 2018-07-19 ENCOUNTER — Telehealth (HOSPITAL_COMMUNITY): Payer: Self-pay | Admitting: *Deleted

## 2018-07-19 ENCOUNTER — Ambulatory Visit (INDEPENDENT_AMBULATORY_CARE_PROVIDER_SITE_OTHER): Payer: Medicare Other | Admitting: *Deleted

## 2018-07-19 DIAGNOSIS — I255 Ischemic cardiomyopathy: Secondary | ICD-10-CM | POA: Diagnosis not present

## 2018-07-19 LAB — CUP PACEART REMOTE DEVICE CHECK
Battery Remaining Longevity: 76 mo
Battery Remaining Percentage: 74 %
Battery Voltage: 2.95 V
Brady Statistic RV Percent Paced: 1 %
Date Time Interrogation Session: 20200630013038
HighPow Impedance: 68 Ohm
HighPow Impedance: 68 Ohm
Implantable Lead Implant Date: 20170807
Implantable Lead Location: 753860
Implantable Pulse Generator Implant Date: 20170807
Lead Channel Impedance Value: 400 Ohm
Lead Channel Pacing Threshold Amplitude: 0.5 V
Lead Channel Pacing Threshold Pulse Width: 0.5 ms
Lead Channel Sensing Intrinsic Amplitude: 11.8 mV
Lead Channel Setting Pacing Amplitude: 2.5 V
Lead Channel Setting Pacing Pulse Width: 0.5 ms
Lead Channel Setting Sensing Sensitivity: 0.5 mV
Pulse Gen Serial Number: 7377761

## 2018-07-19 NOTE — Telephone Encounter (Signed)
Called to update patient on the status of the maintenance Cardiac Rehab exercise classes.  At this point, they remain on hold due to COVID-19 pandemic precautions.

## 2018-07-20 ENCOUNTER — Encounter: Payer: Medicare Other | Admitting: *Deleted

## 2018-07-20 ENCOUNTER — Other Ambulatory Visit: Payer: Self-pay

## 2018-07-20 ENCOUNTER — Other Ambulatory Visit: Payer: Self-pay | Admitting: Internal Medicine

## 2018-07-20 DIAGNOSIS — Z006 Encounter for examination for normal comparison and control in clinical research program: Secondary | ICD-10-CM

## 2018-07-20 NOTE — Research (Signed)
Subject drove to curb to return IP for [employer] week 288/EOS. Subject concerned with coming in for labs and ECG due to COVID-19. Subject did not return sleeve #1, stated " I took all the pills and threw the box away." Compliance was based on this statement. No AE/SAE?PEP to report. Thank subject for participating in the study.

## 2018-07-30 ENCOUNTER — Encounter: Payer: Self-pay | Admitting: Cardiology

## 2018-07-30 NOTE — Progress Notes (Signed)
Remote ICD transmission.   

## 2018-08-31 ENCOUNTER — Other Ambulatory Visit: Payer: Self-pay | Admitting: Cardiology

## 2018-09-16 ENCOUNTER — Other Ambulatory Visit: Payer: Self-pay

## 2018-09-16 ENCOUNTER — Other Ambulatory Visit: Payer: Self-pay | Admitting: Internal Medicine

## 2018-09-16 ENCOUNTER — Other Ambulatory Visit (INDEPENDENT_AMBULATORY_CARE_PROVIDER_SITE_OTHER): Payer: Medicare Other

## 2018-09-16 DIAGNOSIS — Z23 Encounter for immunization: Secondary | ICD-10-CM | POA: Diagnosis not present

## 2018-09-23 ENCOUNTER — Other Ambulatory Visit: Payer: Self-pay | Admitting: Internal Medicine

## 2018-09-27 ENCOUNTER — Other Ambulatory Visit: Payer: Self-pay | Admitting: Cardiology

## 2018-10-18 ENCOUNTER — Ambulatory Visit (INDEPENDENT_AMBULATORY_CARE_PROVIDER_SITE_OTHER): Payer: Medicare Other | Admitting: *Deleted

## 2018-10-18 DIAGNOSIS — I255 Ischemic cardiomyopathy: Secondary | ICD-10-CM | POA: Diagnosis not present

## 2018-10-18 DIAGNOSIS — I5042 Chronic combined systolic (congestive) and diastolic (congestive) heart failure: Secondary | ICD-10-CM

## 2018-10-23 ENCOUNTER — Other Ambulatory Visit: Payer: Self-pay | Admitting: Cardiology

## 2018-10-23 ENCOUNTER — Other Ambulatory Visit: Payer: Self-pay | Admitting: Internal Medicine

## 2018-10-23 LAB — CUP PACEART REMOTE DEVICE CHECK
Battery Remaining Longevity: 73 mo
Battery Remaining Percentage: 72 %
Battery Voltage: 2.96 V
Brady Statistic RV Percent Paced: 1 %
Date Time Interrogation Session: 20201002143443
HighPow Impedance: 66 Ohm
HighPow Impedance: 66 Ohm
Implantable Lead Implant Date: 20170807
Implantable Lead Location: 753860
Implantable Pulse Generator Implant Date: 20170807
Lead Channel Impedance Value: 400 Ohm
Lead Channel Pacing Threshold Amplitude: 0.5 V
Lead Channel Pacing Threshold Pulse Width: 0.5 ms
Lead Channel Sensing Intrinsic Amplitude: 12 mV
Lead Channel Setting Pacing Amplitude: 2.5 V
Lead Channel Setting Pacing Pulse Width: 0.5 ms
Lead Channel Setting Sensing Sensitivity: 0.5 mV
Pulse Gen Serial Number: 7377761

## 2018-10-27 DIAGNOSIS — L304 Erythema intertrigo: Secondary | ICD-10-CM | POA: Diagnosis not present

## 2018-10-27 DIAGNOSIS — Z85828 Personal history of other malignant neoplasm of skin: Secondary | ICD-10-CM | POA: Diagnosis not present

## 2018-10-27 NOTE — Progress Notes (Signed)
Remote ICD transmission.   

## 2018-10-28 ENCOUNTER — Encounter: Payer: Medicare Other | Admitting: Internal Medicine

## 2018-11-07 ENCOUNTER — Other Ambulatory Visit: Payer: Self-pay | Admitting: Cardiology

## 2018-11-07 DIAGNOSIS — I472 Ventricular tachycardia, unspecified: Secondary | ICD-10-CM

## 2018-11-18 NOTE — Progress Notes (Deleted)
Cardiology Office Note Date:  11/18/2018  Patient ID:  Leonard [name], Leonard [name] December 27, 1945, MRN [id number] PCP:  Girtha Rm, NP-C  Cardiologist:  Dr. Ellyn Hack Electrophysiologist: Dr. Caryl Comes AHF: Dr. Haroldine Laws  ***refresh   Chief Complaint: ***  History of Present Illness: Leonard [name] is a 73 y.o. male with history of CAD (most recent PCI  2017, cath 2018 no intervention), ICM, VT, ICD, chronic CHF (systolic), CKD ([name]), HTN, HLD, LBBB   He comes in today to be seen for Dr.Klein, last seen by him Feb 2019, at that time, with worsening LVEF, LBBB, and worsening HF symptoms, planned to undergo ICD upgrade to CRT device >> I do not see that this was done (likely 2/2 COVID emergence shortly after this visit) He saw Dr. Ellyn Hack shortly afterwards, his toprol increased for PVCs   *** research?? Glaactic HF study *** still?? *** needs BMET, dig level *** remotes? *** meds, CM, CAD *** symptoms, syncope, shocks *** upgrade?, repeat echo? *** volume status *** re-establish w/harding and AHF   Device information SJM single chamber ICD, implanted 08/26/15, secondary prevention 09/2015 appropriate tx for PMVT (Dr. Caryl Comes mentions some degree of long short coupling) No h/o AAD  Past Medical History:  Diagnosis Date  . AICD (automatic cardioverter/defibrillator) present 08/26/2015  . Anxiety   . Atherosclerotic heart disease of native coronary artery with angina pectoris ([place]) 07/2015   a. 07/2015 Posterior STEMI/PCI: LM nl, LAd 40ost, RI 40, RCA 193m (3.0x22 Resolute Integrity DES distal, 3.0x12 Resolute Integrity DES prox), 90d (PTCA), RPDA small, nl, RPLB2 100 - too small for PTCA. -- patent in f/u cath 08/2015 & 05/2016 - patent RCA stents (~10%), RI 40%, LM ~30% ost LAD ~40%.  . Chronic combined systolic and diastolic CHF (congestive heart failure) ([place]) 07/2015   a. 07/2015 Ehco: EF 45-50%, basal-mid inf and infsept AK, inflat, apical inf, and apical septal HK, Gr1 DD, triv  MR.;; b - Echo 05/2016 -> EF 20-25%, Gr 2 DD.  Severe diffuse HK disproportionately severe HK and thinning of apex, anterior septum and anterior wall-with septal dyssynchrony.  PAP ~53 mmHg.  [name] Hyperlipidemia with target low density lipoprotein (LDL) cholesterol less than 70 mg/dL   . Hypertensive heart disease   . Ischemic cardiomyopathy 07/2015 - 05/2016   a. 07/2015 Echo: EF 45-50%.; Follow-up Echo 08/23/2015: EF 50-55% with mild inferior hypokinesis --> 05/2016: EF 20-25%, diffuse HK. PAP ~53 mmHg.  . Kidney disease, chronic, stage [name] (GFR 30-59 ml/min) ([place]) 08/12/2015  . Morbid obesity ([place])   . ST elevation myocardial infarction (STEMI) of inferior wall ([place]) 08/12/2015   Subacute presentation for inferior MI with post infarction angina on the following day. EKG still shows injury current. Therefore decided to proceed with intervention of 100% mRCA thrombotic lesion.  . Sustained ventricular fibrillation ([place]) 08/22/2015   Underwent EP evaluation with final ICD implantation.    Past Surgical History:  Procedure Laterality Date  . CARDIAC CATHETERIZATION N/A 08/12/2015   Procedure: Left Heart Cath and Coronary Angiography;  Surgeon: Leonie Man, MD;  Location: [facility];  Service: Cardiovascular: Inferior STEMI: 100% mid RCA, 90% distal RCA, 100% RPL. 40% ostial and proximal LAD and branch of ramus.  [name] CARDIAC CATHETERIZATION N/A 08/26/2015   Procedure: Left Heart Cath and Coronary Angiography;  Surgeon: Belva Crome, MD;  Location: [facility];  Service: Cardiovascular: To evaluate sustained VT. Widely patent RCA stent and distal PTCA site. Also patent RPL branch.  LAD lesion appeared to be more consistent with 50-55% and 40%. Otherwise stable from previous cath. EF estimated 35-45%.  [name] CARDIOVERSION N/A 08/23/2015   Procedure: CARDIOVERSION;  Surgeon: Deboraha Sprang, MD;  Location: [facility];  Service: Cardiovascular;  Laterality: N/A;  . CORONARY ANGIOPLASTY WITH STENT PLACEMENT   08/12/2015   Mid RCA 100% reduced to 0% with 2 overlapping Resolute DES.  3.0 x 22 mm with a 3.0 x 12 mm prox overlarp (postdilated to 3.4 mm); PTCA of dRCA 90%. (Very dificult,complex case - tortuous, [place] RCA - unable to advance longer stents.  RPL2 noted to have thromboembolic 542% occlusion - unable to reach.  . ELECTROPHYSIOLOGIC STUDY N/A 08/26/2015   Procedure: Electrophysiology Study;  Surgeon: Deboraha Sprang, MD;  Location: [facility];  Service: Cardiovascular;  Laterality: N/A;  . EP IMPLANTABLE DEVICE N/A 08/26/2015   Procedure: ICD Implant;  Surgeon: Deboraha Sprang, MD;  Location: [facility];  Service: Cardiovascular;  Laterality: LEFT:  St Jude ICD, serial number [id number].   [name] HEMORRHOID SURGERY    . RIGHT/LEFT HEART CATH AND CORONARY ANGIOGRAPHY N/A 06/10/2016   Procedure: Right/Left Heart Cath and Coronary Angiography;  Surgeon: Martinique, Peter M, MD;  Location: [facility]:  Nonobstructive CAD. RCA stents PATENT.  Ost-Prox RCA, 10 %. Ost LM 30 %. Ost LAD 40 %. RI, 40 %; High PCWP  & LVEDP (23 mmHg) ; PAP 43/23/31 mmHg. CO/CI 3.89 / 1/5 (Severely Reduced)  . SKIN SURGERY    . TRANSTHORACIC ECHOCARDIOGRAM  08/13/2015; 08/23/2015   a. Mild concentric LVH. EF 45-50%. Basal-mid inferior and inferoseptal akinesis with hypokinesis of inferolateral and apical inferior wall. 1 DD. ;; b. Technically difficult study. Definity contrast administered.  Compared to a prior echo in 07/2015, the LVEF is slightly higher at 50-55% with mild inferior hypokinesis.  . TRANSTHORACIC ECHOCARDIOGRAM  06/09/2016   EF  20-25%, GR 2 DD. Severe diffuse  hypokinesis with distinct regional wall motion abnormalities.   There is disproportionately severe hypokinesis and thinning of the apex, anterior septum and anterior wall (septal dyssynchrony). However, there is severe dyssynchrony , making it difficult to assess regional function.  PA P ~53 mmHg  . TRANSTHORACIC ECHOCARDIOGRAM  01/2017    Severely reduced EF.  GI to DD.  Moderate MR.  Mild to have a moderately elevated PAP (peak 26mmHg)    Current Outpatient Medications  Medication Sig Dispense Refill  . aspirin EC 81 MG tablet Take 1 tablet (81 mg total) by mouth daily.    [name] atorvastatin (LIPITOR) 80 MG tablet TAKE 1 TABLET BY MOUTH DAILY AT 6 PM; MAKE ANNUAL APPOINTMENT WITH DOCTOR KLEIN FOR FUTURE REFILLS 90 tablet 0  . BRILINTA 90 MG TABS tablet TAKE 1 TABLET BY MOUTH TWICE DAILY(NEEDS OFFICE VISIT FOR REFILLS) 180 tablet 2  . Cholecalciferol (VITAMIN D) 2000 units CAPS Take 1 capsule by mouth daily.    . Coenzyme Q10 (COQ10) 200 MG CAPS Take 200 mg by mouth daily.    . digoxin (LANOXIN) 0.125 MG tablet Take 1 tablet (0.125 mg total) by mouth daily. NEED OV. 90 tablet 0  . ENTRESTO 49-51 MG TAKE 1 TABLET BY MOUTH TWICE DAILY 180 tablet 0  . furosemide (LASIX) 40 MG tablet TAKE 1 TABLET BY MOUTH EVERY MORNING 30 tablet 10  . Investigational - Study Medication Take 1 tablet by mouth 2 (two) times daily. Study name: Galactic HF Study Additional study details: Omecamtiv Mecarbil or Placebo  14 each PRN  . MAGNESIUM-OXIDE 400 (241.3 Mg) MG tablet TAKE 1 TABLET BY MOUTH TWICE DAILY 60 tablet 3  . metoprolol succinate (TOPROL-XL) 50 MG 24 hr tablet TAKE 1 TABLET BY MOUTH DAILY 90 tablet 1  . Multiple Vitamin (MULTIVITAMIN) tablet Take 1 tablet by mouth daily.    . nitroGLYCERIN (NITROSTAT) 0.4 MG SL tablet Place 1 tablet (0.4 mg total) under the tongue every 5 (five) minutes as needed for chest pain. 25 tablet 2  . Omega-3 Fatty Acids (FISH OIL) 1200 MG CPDR Take 1 tablet by mouth daily.    [name] spironolactone (ALDACTONE) 25 MG tablet TAKE 1/2 TABLET BY MOUTH EVERY MORNING 45 tablet 3  . venlafaxine (EFFEXOR) 75 MG tablet TAKE 1 TABLET BY MOUTH EVERY DAY 30 tablet 5   No current facility-administered medications for this visit.     Allergies:   Other and Sulfa antibiotics   Social History:  The patient  reports that he has never  smoked. He has never used smokeless tobacco. He reports current alcohol use of about 3.0 - 4.0 standard drinks of alcohol per week. He reports that he does not use drugs.   Family History:  The patient's family history includes Diabetes in his mother; Heart disease in his paternal grandfather and paternal grandmother.  ROS:  Please see the history of present illness.  All other systems are reviewed and otherwise negative.   PHYSICAL EXAM: *** VS:  There were no vitals taken for this visit. BMI: There is no height or weight on file to calculate BMI. Well nourished, well developed, in no acute distress  HEENT: normocephalic, atraumatic  Neck: no *** JVD, carotid bruits or masses Cardiac:  *** RRR; no significant murmurs, no rubs, or gallops Lungs:  *** CTA b/l, no wheezing, rhonchi or rales  Abd: soft, nontender MS: no deformity or *** atrophy Ext: *** no edema  Skin: warm and dry, no rash Neuro:  No gross deficits appreciated Psych: euthymic mood, full affect  *** ICD site is stable, no tethering or discomfort   EKG:  Done today and reviewed by myself shows *** ICD interrogation done today and reviewed by myself; ***  02/04/17: TTE Study Conclusions - Left ventricle: The cavity size was severely dilated. Systolic   function was severely reduced. The estimated ejection fraction   was in the range of 15% to 20%. Diffuse hypokinesis. Features are   consistent with a pseudonormal left ventricular filling pattern,   with concomitant abnormal relaxation and increased filling   pressure (grade 2 diastolic dysfunction). There was moderate   spontaneous echo contrast, indicative of stasis. - Mitral valve: There was mild to moderate regurgitation. - Left atrium: The atrium was mildly dilated. - Pulmonary arteries: Systolic pressure was mildly to moderately   increased. PA peak pressure: 39 mm Hg (S).   06/10/16: LHC  Ramus lesion, 40 %stenosed.  Ost RCA to Prox RCA lesion, 10  %stenosed.  Mid RCA lesion, 0 %stenosed.  Dist RCA lesion, 10 %stenosed.  Ost LM to LM lesion, 30 %stenosed.  Ost LAD lesion, 40 %stenosed.  LV end diastolic pressure is moderately elevated.  Hemodynamic findings consistent with mild pulmonary hypertension.   1. Nonobstructive CAD. Continued patency of stents in the RCA 2. Elevated LVEDP 3. Mild pulmonary HTN. 4. Reduced cardiac output. 3.89 L/min with index of 1.5.   Recent Labs: No results found for requested labs within last 8760 hours.  No results found for requested labs within last 8760  hours.   CrCl cannot be calculated (Patient's most recent lab result is older than the maximum 21 days allowed.).   Wt Readings from Last 3 Encounters:  06/14/18 292 lb (132.5 kg)  12/27/17 290 lb (131.5 kg)  09/22/17 296 lb 9.6 oz (134.5 kg)     Other studies reviewed: Additional studies/records reviewed today include: summarized above  ASSESSMENT AND PLAN:  1. ICD     ***  2. ICM 3. Chronic CHF (systolic) 4. LBBB     ***  5. CAD     ***  Disposition: F/u with ***  Current medicines are reviewed at length with the patient today.  The patient did not have any concerns regarding medicines.***  Venetia Night, PA-C 11/18/2018 8:19 AM     [facility] [address] [place] [address] [phone] (office)  [phone] (fax)

## 2018-11-20 HISTORY — PX: TRANSTHORACIC ECHOCARDIOGRAM: SHX275

## 2018-11-21 ENCOUNTER — Encounter: Payer: Medicare Other | Admitting: Physician Assistant

## 2018-11-22 ENCOUNTER — Other Ambulatory Visit: Payer: Self-pay | Admitting: Cardiology

## 2018-11-22 ENCOUNTER — Other Ambulatory Visit: Payer: Self-pay | Admitting: Family Medicine

## 2018-11-22 NOTE — Telephone Encounter (Signed)
Is this okay to refill? 

## 2018-11-22 NOTE — Telephone Encounter (Signed)
Ok to give 3 months but will need a visit before then.

## 2018-11-22 NOTE — Telephone Encounter (Signed)
Rx has been sent to the pharmacy electronically. ° °

## 2018-11-22 NOTE — Telephone Encounter (Signed)
Left detailed message for pt to call back to schedule a 3 month follow-up before med runs out

## 2018-11-28 NOTE — Progress Notes (Addendum)
Cardiology Office Note Date:  12/05/2018  Patient ID:  Leonard, [name] [birth year], MRN [id number] PCP:  Girtha Rm, NP-C  Cardiologist:  Dr. Ellyn Hack Electrophysiologist: Dr. Caryl Comes AHF: Dr. Haroldine Laws   Chief Complaint: over due EP visit, LE swelling  History of Present Illness: Leonard [name] is a 73 y.o. male with history of CAD (most recent PCI  2017, cath 2018 no intervention), ICM, VT, ICD, chronic CHF (systolic), CKD ([name]), HTN, HLD, LBBB   He comes in today to be seen for Dr.Klein, last seen by him Feb 2019, at that time, with worsening LVEF, LBBB, and worsening HF symptoms, planned to undergo ICD upgrade to CRT device >> I do not see that this was done He saw Dr. Ellyn Hack shortly afterwards, his toprol increased for PVCs  He comes accompanied by his wife, in a wheelchair.  His wife is surprised to hear it has been nearly 2 years since his last visit with Dr. Caryl Comes or Dr. Ellyn Hack, the patient states he had not realized had been that long either.  He has seen his primary, the last time in 5 mo,reportedly had labs and a visit, he thinks everything looked ok.  1. He reports ambulating at home, uses a cane, mentions after years of sports as a young man he has many orthopedic  Issues.  He despite help today was unable to stand up on the scale to be weighed.  He reports that he has much difficulty getting up with ankle pains, knee pains , but once up is ok.  His wife seems to hedge that this is worse then he lets on but does get up and around the house, but not much.  2. He has marked weeping edema He denies rest SOB, unclear how much DOE he has, does not sound like he does much walking  He reports he has nights he feels comfortable in bed, sleeping with HOB up some, his wife says more ofthen is in the recliner, or sits at the edge of the bed  He insists that he weighed a week or so ago at home, was 291lbs (his wife seems less convinced)  3. He denies any kind of CP or  palpitations, no dizziness, near syncope or syncope  4. Denies shocks  5. Reports no longer in study (completed) about 2 mo ago  6. We are getting remotes intermittently  7. He drinks 5-6 scotch drinks nightly   He says he was doing quite well until COVID shut down cardiac rehab.  Since then he has had a slow steady decline in activity, overall wellbeing, mood, with steadily increasing swelling, weight. He has not had much appetite in a few months, continues to gain weight  Cardiac rehab seemed to be his link to a sens of wellbeing and he very much wants to get back to this.   Device information SJM single chamber ICD, implanted 08/26/15, secondary prevention 09/2015 appropriate tx for PMVT (Dr. Caryl Comes mentions some degree of long short coupling) No h/o AAD  Past Medical History:  Diagnosis Date   AICD (automatic cardioverter/defibrillator) present 08/26/2015   Anxiety    Atherosclerotic heart disease of native coronary artery with angina pectoris ([place]) 07/2015   a. 07/2015 Posterior STEMI/PCI: LM nl, LAd 40ost, RI 40, RCA 157m (3.0x22 Resolute Integrity DES distal, 3.0x12 Resolute Integrity DES prox), 90d (PTCA), RPDA small, nl, RPLB2 100 - too small for PTCA. -- patent in f/u cath 08/2015 & 05/2016 - patent RCA stents (~10%),  RI 40%, LM ~30% ost LAD ~40%.   Chronic combined systolic and diastolic CHF (congestive heart failure) ([place]) 07/2015   a. 07/2015 Ehco: EF 45-50%, basal-mid inf and infsept AK, inflat, apical inf, and apical septal HK, Gr1 DD, triv MR.;; b - Echo 05/2016 -> EF 20-25%, Gr 2 DD.  Severe diffuse HK disproportionately severe HK and thinning of apex, anterior septum and anterior wall-with septal dyssynchrony.  PAP ~53 mmHg.   Hyperlipidemia with target low density lipoprotein (LDL) cholesterol less than 70 mg/dL    Hypertensive heart disease    Ischemic cardiomyopathy 07/2015 - 05/2016   a. 07/2015 Echo: EF 45-50%.; Follow-up Echo 08/23/2015: EF 50-55% with mild  inferior hypokinesis --> 05/2016: EF 20-25%, diffuse HK. PAP ~53 mmHg.   Kidney disease, chronic, stage [name] (GFR 30-59 ml/min) (HCC) 08/12/2015   Morbid obesity (HCC)    ST elevation myocardial infarction (STEMI) of inferior wall ([place]) 08/12/2015   Subacute presentation for inferior MI with post infarction angina on the following day. EKG still shows injury current. Therefore decided to proceed with intervention of 100% mRCA thrombotic lesion.   Sustained ventricular fibrillation ([place]) 08/22/2015   Underwent EP evaluation with final ICD implantation.    Past Surgical History:  Procedure Laterality Date   CARDIAC CATHETERIZATION N/A 08/12/2015   Procedure: Left Heart Cath and Coronary Angiography;  Surgeon: Leonie Man, MD;  Location: [facility];  Service: Cardiovascular: Inferior STEMI: 100% mid RCA, 90% distal RCA, 100% RPL. 40% ostial and proximal LAD and branch of ramus.   CARDIAC CATHETERIZATION N/A 08/26/2015   Procedure: Left Heart Cath and Coronary Angiography;  Surgeon: Belva Crome, MD;  Location: [facility];  Service: Cardiovascular: To evaluate sustained VT. Widely patent RCA stent and distal PTCA site. Also patent RPL branch. LAD lesion appeared to be more consistent with 50-55% and 40%. Otherwise stable from previous cath. EF estimated 35-45%.   CARDIOVERSION N/A 08/23/2015   Procedure: CARDIOVERSION;  Surgeon: Deboraha Sprang, MD;  Location: [facility];  Service: Cardiovascular;  Laterality: N/A;   CORONARY ANGIOPLASTY WITH STENT PLACEMENT  08/12/2015   Mid RCA 100% reduced to 0% with 2 overlapping Resolute DES.  3.0 x 22 mm with a 3.0 x 12 mm prox overlarp (postdilated to 3.4 mm); PTCA of dRCA 90%. (Very dificult,complex case - tortuous, [place] RCA - unable to advance longer stents.  RPL2 noted to have thromboembolic 308% occlusion - unable to reach.   ELECTROPHYSIOLOGIC STUDY N/A 08/26/2015   Procedure: Electrophysiology Study;  Surgeon: Deboraha Sprang,  MD;  Location: [facility];  Service: Cardiovascular;  Laterality: N/A;   EP IMPLANTABLE DEVICE N/A 08/26/2015   Procedure: ICD Implant;  Surgeon: Deboraha Sprang, MD;  Location: [facility];  Service: Cardiovascular;  Laterality: LEFT:  St Jude ICD, serial number [id number].    HEMORRHOID SURGERY     RIGHT/LEFT HEART CATH AND CORONARY ANGIOGRAPHY N/A 06/10/2016   Procedure: Right/Left Heart Cath and Coronary Angiography;  Surgeon: Martinique, Peter M, MD;  Location: [facility]AS[facility]:  Nonobstructive CAD. RCA stents PATENT.  Ost-Prox RCA, 10 %. Ost LM 30 %. Ost LAD 40 %. RI, 40 %; High PCWP  & LVEDP (23 mmHg) ; PAP 43/23/31 mmHg. CO/CI 3.89 / 1/5 (Severely Reduced)   SKIN SURGERY     TRANSTHORACIC ECHOCARDIOGRAM  08/13/2015; 08/23/2015   a. Mild concentric LVH. EF 45-50%. Basal-mid inferior and inferoseptal akinesis with hypokinesis of inferolateral and apical  inferior wall. 1 DD. ;; b. Technically difficult study. Definity contrast administered.  Compared to a prior echo in 07/2015, the LVEF is slightly higher at 50-55% with mild inferior hypokinesis.   TRANSTHORACIC ECHOCARDIOGRAM  06/09/2016   EF  20-25%, GR 2 DD. Severe diffuse  hypokinesis with distinct regional wall motion abnormalities.   There is disproportionately severe hypokinesis and thinning of the apex, anterior septum and anterior wall (septal dyssynchrony). However, there is severe dyssynchrony , making it difficult to assess regional function.  PA P ~53 mmHg   TRANSTHORACIC ECHOCARDIOGRAM  01/2017   Severely reduced EF.  GI to DD.  Moderate MR.  Mild to have a moderately elevated PAP (peak 24mmHg)    Current Outpatient Medications  Medication Sig Dispense Refill   aspirin EC 81 MG tablet Take 1 tablet (81 mg total) by mouth daily.     atorvastatin (LIPITOR) 80 MG tablet TAKE 1 TABLET BY MOUTH DAILY AT 6 PM; MAKE ANNUAL APPOINTMENT WITH DOCTOR KLEIN FOR FUTURE REFILLS 90 tablet 0   BRILINTA 90 MG TABS tablet TAKE 1  TABLET BY MOUTH TWICE DAILY(NEEDS OFFICE VISIT FOR REFILLS) 180 tablet 2   Cholecalciferol (VITAMIN D) 2000 units CAPS Take 1 capsule by mouth daily.     Coenzyme Q10 (COQ10) 200 MG CAPS Take 200 mg by mouth daily.     digoxin (LANOXIN) 0.125 MG tablet TAKE 1 TABLET BY MOUTH DAILY 90 tablet 2   ENTRESTO 49-51 MG TAKE 1 TABLET BY MOUTH TWICE DAILY 180 tablet 0   Investigational - Study Medication Take 1 tablet by mouth 2 (two) times daily. Study name: Galactic HF Study Additional study details: Omecamtiv Mecarbil or Placebo 14 each PRN   MAGNESIUM-OXIDE 400 (241.3 Mg) MG tablet TAKE 1 TABLET BY MOUTH TWICE DAILY 60 tablet 3   metoprolol succinate (TOPROL-XL) 50 MG 24 hr tablet TAKE 1 TABLET BY MOUTH DAILY 90 tablet 1   Multiple Vitamin (MULTIVITAMIN) tablet Take 1 tablet by mouth daily.     nitroGLYCERIN (NITROSTAT) 0.4 MG SL tablet Place 1 tablet (0.4 mg total) under the tongue every 5 (five) minutes as needed for chest pain. 25 tablet 2   Omega-3 Fatty Acids (FISH OIL) 1200 MG CPDR Take 1 tablet by mouth daily.     spironolactone (ALDACTONE) 25 MG tablet Take 1 tablet (25 mg total) by mouth daily. 90 tablet 2   venlafaxine (EFFEXOR) 75 MG tablet TAKE 1 TABLET BY MOUTH EVERY DAY 30 tablet 2   cephALEXin (KEFLEX) 500 MG capsule Take 1 capsule (500 mg total) by mouth 2 (two) times daily. 14 capsule 0   torsemide (DEMADEX) 20 MG tablet Take 4 tablets (80 mg total) by mouth 2 (two) times daily. Start by taking (40 mg) two tablets daily if no increase in urine output increase to (80 mg) 4 tablets daily and call clinic for further instructions. 240 tablet 1   No current facility-administered medications for this visit.     Allergies:   Other and Sulfa antibiotics   Social History:  The patient  reports that he has never smoked. He has never used smokeless tobacco. He reports current alcohol use of about 3.0 - 4.0 standard drinks of alcohol per week. He reports that he does not use  drugs.   Family History:  The patient's family history includes Diabetes in his mother; Heart disease in his paternal grandfather and paternal grandmother.  ROS:  Please see the history of present illness.  All other systems are  reviewed and otherwise negative.   PHYSICAL EXAM:  VS:  BP 110/68    Pulse 89    Ht 6\' 2"  (1.88 m)    Wt 291 lb (132 kg)    BMI 37.36 kg/m  BMI: Body mass index is 37.36 kg/m. Well nourished, well developed, in no acute distress  HEENT: normocephalic, atraumatic  Neck: + 6 JVD, carotid bruits or masses Cardiac:  RRR; no significant murmurs, no rubs, or gallops Lungs:  CTA b/l, no wheezing, rhonchi or rales  Abd: soft, nontender, obese, not clearly distendied MS: no deformity or atrophy Ext:  Marked edema, weeping, several small superficial wounds, none bleeding, some scabbed, erythematous R>L with somewhat of chronic looking discoloration as well Skin: warm and dry, no rash Neuro:  No gross deficits appreciated Psych: euthymic mood, full affect  ICD site is stable, no tethering or discomfort   EKG:  Done today and reviewed by myself shows: SR, 1st degree AVblock, LAD, LBBB, QRS 133ms ICD interrogation done today and reviewed by myself;  Battery and lead measurements are good No therapies 3 NSVT only    02/04/17: TTE Study Conclusions - Left ventricle: The cavity size was severely dilated. Systolic   function was severely reduced. The estimated ejection fraction   was in the range of 15% to 20%. Diffuse hypokinesis. Features are   consistent with a pseudonormal left ventricular filling pattern,   with concomitant abnormal relaxation and increased filling   pressure (grade 2 diastolic dysfunction). There was moderate   spontaneous echo contrast, indicative of stasis. - Mitral valve: There was mild to moderate regurgitation. - Left atrium: The atrium was mildly dilated. - Pulmonary arteries: Systolic pressure was mildly to moderately   increased. PA  peak pressure: 39 mm Hg (S).   06/10/16: LHC  Ramus lesion, 40 %stenosed.  Ost RCA to Prox RCA lesion, 10 %stenosed.  Mid RCA lesion, 0 %stenosed.  Dist RCA lesion, 10 %stenosed.  Ost LM to LM lesion, 30 %stenosed.  Ost LAD lesion, 40 %stenosed.  LV end diastolic pressure is moderately elevated.  Hemodynamic findings consistent with mild pulmonary hypertension.   1. Nonobstructive CAD. Continued patency of stents in the RCA 2. Elevated LVEDP 3. Mild pulmonary HTN. 4. Reduced cardiac output. 3.89 L/min with index of 1.5.   Recent Labs: No results found for requested labs within last 8760 hours.  No results found for requested labs within last 8760 hours.   CrCl cannot be calculated (Patient's most recent lab result is older than the maximum 21 days allowed.).   Wt Readings from Last 3 Encounters:  12/05/18 291 lb (132 kg)  06/14/18 292 lb (132.5 kg)  12/27/17 290 lb (131.5 kg)     Other studies reviewed: Additional studies/records reviewed today include: summarized above  ASSESSMENT AND PLAN:   Dr. Caryl Comes participated in today's visit  1. ICD     Intact function, no programming changes made     Asked him to make sure his transmitter is lugged in at all times  2. ICM 3. Chronic CHF (systolic) 4. LBBB     On BB, Entresto, furosemide, aldactone (taking only a few days a week), dig     Markedly volume OL     Suprisingly not overly SOB  5. CAD     No CP, on AS, Brilinta, BB, statin       With Dr. Caryl Comes: Not a ACUTE looking picture, sounds like he has been at this stage for quite a while  though discussed hospitalization and the most efficient, best way to proceed.  In discussion with the patient, will try outpatient first though if unable to get some early successes, will need to revisit hospital management 1. Stop furosemide 2. Start Torsemide     write for 20mg  (4 tabs 80mg ) BID.  The patient instructed to START by taking 2 tabs (40mg ) once daily.  If he  does not have a clear increase with brisk urine output after his 1st day of this to increase to 4 tablets (80mg ) once daily, if he still has no clear increase in urine output discussed will likely move to BID, though to call the office for instructions 3. Increase his aldactone to 25mg  daily 4. Keflex 500mg  BID (LE erythema, superficial wounds)  Instructed lab results may alter these instructions once we have results  Monitor his response closely Daily weights (if/when able), instructed on how, and looking for steady weight loss Reduction in swelling    5. CMP dig level tomorrow AM (lab is closed/staff gone already here) and in 1 week 6. See Dr. Ellyn Hack or one of his APP's in 2 weeks 7. Minimize sodium intake to no more then 2gm daily8 8. No more then 2L oral fluid daily 9. Reduce ETOH intake in 1/2 10. Discuss his depression with his PMD       Hopefully he can soon get back to cardiac rehab, this seems to be very good for him emotionally as well            Disposition: F/u with Q 3 mo device remotes and annual device in-clinic checks.  He has not seen Dr. Haroldine Laws since 2018, Dr. Ellyn Hack since feb 2019.  Will have him start by seeing  Dr. Ellyn Hack or one of his APPs in 2 weeks.   ADDEND: I have staff messaged the EP scheduler to make follow up appointment with Dr. Caryl Comes in a month or so to discuss ICD upgrade to CRT device.   Current medicines are reviewed at length with the patient today.  The patient did not have any concerns regarding medicines.  Venetia Night, PA-C 12/05/2018 5:43 PM     [address] [place] [id number] [phone] (office)  [phone] (fax)

## 2018-11-29 ENCOUNTER — Telehealth: Payer: Self-pay | Admitting: Internal Medicine

## 2018-11-29 NOTE — Telephone Encounter (Signed)
  Wife is calling because she would like to speak to the nurse to give updates on the patient. She is concerned that he will not let the PA know what is going on. He has an appointment on 12/05/18 with Tommye Standard.

## 2018-11-29 NOTE — Telephone Encounter (Signed)
Patient's wife calling with updates on the patient with concerns that he will not be truthful in his upcoming visit about how he is doing.  She states that he has not been in rehab because of COVID and over the past month the patient has begun to decline. He has trouble lying down all the way, a non-productive cough and some SOB. He has also been nauseous and has not been eating very well. He has swelling in his feet and ankles which causes him difficulty walking. He does not want to do very much and his wife believes that he is depressed. He is not having any chest pain and she reports that his defibrillator has not gone off.   He does take an extra dose of Lasix when needed which does help. I advised the wife to encourage him to elevate his feet which she reports that he does not like to do. I also advised that the pt should reduce his salt intake. The patient has an appointment on 11/16 with Tommye Standard, PA-C, I offered to try and find the patient an earlier appointment but his wife declined. I advised her to call back if she changed her mind or if anything got worse.

## 2018-12-05 ENCOUNTER — Ambulatory Visit (INDEPENDENT_AMBULATORY_CARE_PROVIDER_SITE_OTHER): Payer: Medicare Other | Admitting: Physician Assistant

## 2018-12-05 ENCOUNTER — Other Ambulatory Visit: Payer: Self-pay

## 2018-12-05 VITALS — BP 110/68 | HR 89 | Ht 74.0 in | Wt 291.0 lb

## 2018-12-05 DIAGNOSIS — I5022 Chronic systolic (congestive) heart failure: Secondary | ICD-10-CM | POA: Diagnosis not present

## 2018-12-05 DIAGNOSIS — I2129 ST elevation (STEMI) myocardial infarction involving other sites: Secondary | ICD-10-CM | POA: Diagnosis not present

## 2018-12-05 DIAGNOSIS — I447 Left bundle-branch block, unspecified: Secondary | ICD-10-CM

## 2018-12-05 DIAGNOSIS — I5042 Chronic combined systolic (congestive) and diastolic (congestive) heart failure: Secondary | ICD-10-CM

## 2018-12-05 DIAGNOSIS — Z95810 Presence of automatic (implantable) cardiac defibrillator: Secondary | ICD-10-CM | POA: Diagnosis not present

## 2018-12-05 DIAGNOSIS — I255 Ischemic cardiomyopathy: Secondary | ICD-10-CM | POA: Diagnosis not present

## 2018-12-05 DIAGNOSIS — I2510 Atherosclerotic heart disease of native coronary artery without angina pectoris: Secondary | ICD-10-CM | POA: Diagnosis not present

## 2018-12-05 MED ORDER — TORSEMIDE 20 MG PO TABS: 80.0000 mg | ORAL_TABLET | Freq: Two times a day (BID) | ORAL | 1 refills | Status: DC

## 2018-12-05 MED ORDER — CEPHALEXIN 500 MG PO CAPS: 500.0000 mg | ORAL_CAPSULE | Freq: Two times a day (BID) | ORAL | 0 refills | Status: DC

## 2018-12-05 MED ORDER — SPIRONOLACTONE 25 MG PO TABS: 25.0000 mg | ORAL_TABLET | Freq: Every day | ORAL | 2 refills | Status: DC

## 2018-12-05 NOTE — Patient Instructions (Signed)
Medication Instructions:   STOP TAKING :  FUROSEMIDE   START TAKING :   1.  Torsemide  Start by taking (40 mg) two tablets daily if no increase in urine output increase to (80 mg) 4 tablets daily and call clinic for further instructions.  2. ALDACTONE 25 MG ONCE A DAY   3. KEFLEX 500 MG TWICE A DAY   *If you need a refill on your cardiac medications before your next appointment, please call your pharmacy*  Lab Work:  CMET AND  DIGOXIN  RETURN BMET IN ONE WEEK   If you have labs (blood work) drawn today and your tests are completely normal, you will receive your results only by:  [place] (if you have MyChart) OR  A paper copy in the mail If you have any lab test that is abnormal or we need to change your treatment, we will call you to review the results.  Testing/Procedures: NONE ORDERED  TODAY   Follow-Up: At [facility], you and your health needs are our priority.  As part of our continuing mission to provide you with exceptional heart care, we have created designated Provider Care Teams.  These Care Teams include your primary Cardiologist (physician) and Advanced Practice Providers (APPs -  Physician Assistants and Nurse Practitioners) who all work together to provide you with the care you need, when you need it.  Your next appointment:   2 WEEKS   The format for your next appointment:   In Person  Provider:   Glenetta Hew, MD or APP  Other Instructions  CUT FLUID INTAKE TO 2 LITERS A DAY  MAKE SURE YOU ARE GETTING WEIGHED DAILY   CUT BACK ON ALCOHOL INTAKE  TO HALF    Low-Sodium Eating Plan Sodium, which is an element that makes up salt, helps you maintain a healthy balance of fluids in your body. Too much sodium can increase your blood pressure and cause fluid and waste to be held in your body. Your health care provider or dietitian may recommend following this plan if you have high blood pressure (hypertension), kidney disease, liver disease, or  heart failure. Eating less sodium can help lower your blood pressure, reduce swelling, and protect your heart, liver, and kidneys. What are tips for following this plan? General guidelines  Most people on this plan should limit their sodium intake to 1,500-2,000 mg (milligrams) of sodium each day. Reading food labels   The Nutrition Facts label lists the amount of sodium in one serving of the food. If you eat more than one serving, you must multiply the listed amount of sodium by the number of servings.  Choose foods with less than 140 mg of sodium per serving.  Avoid foods with 300 mg of sodium or more per serving. Shopping  Look for lower-sodium products, often labeled as "low-sodium" or "no salt added."  Always check the sodium content even if foods are labeled as "unsalted" or "no salt added".  Buy fresh foods. ? Avoid canned foods and premade or frozen meals. ? Avoid canned, cured, or processed meats  Buy breads that have less than 80 mg of sodium per slice. Cooking  Eat more home-cooked food and less restaurant, buffet, and fast food.  Avoid adding salt when cooking. Use salt-free seasonings or herbs instead of table salt or sea salt. Check with your health care provider or pharmacist before using salt substitutes.  Cook with plant-based oils, such as canola, sunflower, or olive oil. Meal planning  When  eating at a restaurant, ask that your food be prepared with less salt or no salt, if possible.  Avoid foods that contain MSG (monosodium glutamate). MSG is sometimes added to [place] food, bouillon, and some canned foods. What foods are recommended? The items listed may not be a complete list. Talk with your dietitian about what dietary choices are best for you. Grains Low-sodium cereals, including oats, puffed wheat and rice, and shredded wheat. Low-sodium crackers. Unsalted rice. Unsalted pasta. Low-sodium bread. Whole-grain breads and whole-grain  pasta. Vegetables Fresh or frozen vegetables. "No salt added" canned vegetables. "No salt added" tomato sauce and paste. Low-sodium or reduced-sodium tomato and vegetable juice. Fruits Fresh, frozen, or canned fruit. Fruit juice. Meats and other protein foods Fresh or frozen (no salt added) meat, poultry, seafood, and fish. Low-sodium canned tuna and salmon. Unsalted nuts. Dried peas, beans, and lentils without added salt. Unsalted canned beans. Eggs. Unsalted nut butters. Dairy Milk. Soy milk. Cheese that is naturally low in sodium, such as ricotta cheese, fresh mozzarella, or Swiss cheese Low-sodium or reduced-sodium cheese. Cream cheese. Yogurt. Fats and oils Unsalted butter. Unsalted margarine with no trans fat. Vegetable oils such as canola or olive oils. Seasonings and other foods Fresh and dried herbs and spices. Salt-free seasonings. Low-sodium mustard and ketchup. Sodium-free salad dressing. Sodium-free light mayonnaise. Fresh or refrigerated horseradish. Lemon juice. Vinegar. Homemade, reduced-sodium, or low-sodium soups. Unsalted popcorn and pretzels. Low-salt or salt-free chips. What foods are not recommended? The items listed may not be a complete list. Talk with your dietitian about what dietary choices are best for you. Grains Instant hot cereals. Bread stuffing, pancake, and biscuit mixes. Croutons. Seasoned rice or pasta mixes. Noodle soup cups. Boxed or frozen macaroni and cheese. Regular salted crackers. Self-rising flour. Vegetables Sauerkraut, pickled vegetables, and relishes. Olives. [place] fries. Onion rings. Regular canned vegetables (not low-sodium or reduced-sodium). Regular canned tomato sauce and paste (not low-sodium or reduced-sodium). Regular tomato and vegetable juice (not low-sodium or reduced-sodium). Frozen vegetables in sauces. Meats and other protein foods Meat or fish that is salted, canned, smoked, spiced, or pickled. Bacon, ham, sausage, hotdogs, corned  beef, chipped beef, packaged lunch meats, salt pork, jerky, pickled herring, anchovies, regular canned tuna, sardines, salted nuts. Dairy Processed cheese and cheese spreads. Cheese curds. Blue cheese. Feta cheese. String cheese. Regular cottage cheese. Buttermilk. Canned milk. Fats and oils Salted butter. Regular margarine. Ghee. Bacon fat. Seasonings and other foods Onion salt, garlic salt, seasoned salt, table salt, and sea salt. Canned and packaged gravies. Worcestershire sauce. Tartar sauce. Barbecue sauce. Teriyaki sauce. Soy sauce, including reduced-sodium. Steak sauce. Fish sauce. Oyster sauce. Cocktail sauce. Horseradish that you find on the shelf. Regular ketchup and mustard. Meat flavorings and tenderizers. Bouillon cubes. Hot sauce and Tabasco sauce. Premade or packaged marinades. Premade or packaged taco seasonings. Relishes. Regular salad dressings. Salsa. Potato and tortilla chips. Corn chips and puffs. Salted popcorn and pretzels. Canned or dried soups. Pizza. Frozen entrees and pot pies. Summary  Eating less sodium can help lower your blood pressure, reduce swelling, and protect your heart, liver, and kidneys.  Most people on this plan should limit their sodium intake to 1,500-2,000 mg (milligrams) of sodium each day.  Canned, boxed, and frozen foods are high in sodium. Restaurant foods, fast foods, and pizza are also very high in sodium. You also get sodium by adding salt to food.  Try to cook at home, eat more fresh fruits and vegetables, and eat less fast food, canned, processed,  or prepared foods. This information is not intended to replace advice given to you by your health care provider. Make sure you discuss any questions you have with your health care provider. Document Released: 06/27/2001 Document Revised: 12/18/2016 Document Reviewed: 12/30/2015 Elsevier Patient Education  2020 [employer].

## 2018-12-06 ENCOUNTER — Other Ambulatory Visit: Payer: Medicare Other

## 2018-12-06 DIAGNOSIS — I447 Left bundle-branch block, unspecified: Secondary | ICD-10-CM | POA: Diagnosis not present

## 2018-12-06 LAB — DIGOXIN LEVEL: Digoxin, Serum: <0.4 ng/mL — ABNORMAL LOW (ref 0.5–0.9)

## 2018-12-06 LAB — COMPREHENSIVE METABOLIC PANEL
ALT: 17 IU/L (ref 0–44)
AST: 27 IU/L (ref 0–40)
Albumin/Globulin Ratio: 1.5 (ref 1.2–2.2)
Albumin: 3.6 g/dL — ABNORMAL LOW (ref 3.7–4.7)
Alkaline Phosphatase: 167 IU/L — ABNORMAL HIGH (ref 39–117)
BUN/Creatinine Ratio: 24 (ref 10–24)
BUN: 41 mg/dL — ABNORMAL HIGH (ref 8–27)
Bilirubin Total: 1 mg/dL (ref 0.0–1.2)
CO2: 20 mmol/L (ref 20–29)
Calcium: 9.6 mg/dL (ref 8.6–10.2)
Chloride: 96 mmol/L (ref 96–106)
Creatinine, Ser: 1.73 mg/dL — ABNORMAL HIGH (ref 0.76–1.27)
GFR calc Af Amer: 44 mL/min/{1.73_m2} — ABNORMAL LOW (ref >59)
GFR calc non Af Amer: 38 mL/min/{1.73_m2} — ABNORMAL LOW (ref >59)
Globulin, Total: 2.4 g/dL (ref 1.5–4.5)
Glucose: 93 mg/dL (ref 65–99)
Potassium: 5.5 mmol/L — ABNORMAL HIGH (ref 3.5–5.2)
Sodium: 133 mmol/L — ABNORMAL LOW (ref 134–144)
Total Protein: 6 g/dL (ref 6.0–8.5)

## 2018-12-08 ENCOUNTER — Encounter: Payer: Self-pay | Admitting: *Deleted

## 2018-12-09 ENCOUNTER — Telehealth: Payer: Self-pay | Admitting: Internal Medicine

## 2018-12-09 NOTE — Telephone Encounter (Signed)
Per his appt with Tommye Standard on 12/05/18, she believes the patient does need to be put on an anti-depressent.

## 2018-12-12 ENCOUNTER — Telehealth: Payer: Self-pay | Admitting: Internal Medicine

## 2018-12-12 ENCOUNTER — Other Ambulatory Visit: Payer: Self-pay | Admitting: Internal Medicine

## 2018-12-12 MED ORDER — ENTRESTO 49-51 MG PO TABS: 1.0000 | ORAL_TABLET | Freq: Two times a day (BID) | ORAL | 3 refills | Status: DC

## 2018-12-12 MED ORDER — ATORVASTATIN CALCIUM 80 MG PO TABS: ORAL_TABLET | ORAL | 3 refills | Status: DC

## 2018-12-12 NOTE — Telephone Encounter (Signed)
I spoke with pt's wife. She reports pt is continuing to urinate frequently on 40 mg torsemide daily. She states pt's lower legs are continuing to weep. Right leg is showing some improvement but left leg looks the same. Pt is now complaining of stinging feeling in lower legs. This is new. Wife also states pt spends a lot of time in his chair and won't get up much.  She is asking if she should encourage him to ambulate more. Wife also talked about patient's depression and I asked her to follow up with primary care for this.  Pt is coming in for lab work tomorrow. Will forward to Tommye Standard regarding leg pain

## 2018-12-12 NOTE — Telephone Encounter (Signed)
Patient's wife needs to ask Dr. Olin Pia nurse a question. Would not discuss anything further.

## 2018-12-13 ENCOUNTER — Other Ambulatory Visit: Payer: Self-pay

## 2018-12-13 ENCOUNTER — Other Ambulatory Visit: Payer: Medicare Other

## 2018-12-13 DIAGNOSIS — I447 Left bundle-branch block, unspecified: Secondary | ICD-10-CM

## 2018-12-13 LAB — BASIC METABOLIC PANEL
BUN/Creatinine Ratio: 23 (ref 10–24)
BUN: 38 mg/dL — ABNORMAL HIGH (ref 8–27)
CO2: 25 mmol/L (ref 20–29)
Calcium: 9.3 mg/dL (ref 8.6–10.2)
Chloride: 91 mmol/L — ABNORMAL LOW (ref 96–106)
Creatinine, Ser: 1.67 mg/dL — ABNORMAL HIGH (ref 0.76–1.27)
GFR calc Af Amer: 46 mL/min/{1.73_m2} — ABNORMAL LOW (ref >59)
GFR calc non Af Amer: 39 mL/min/{1.73_m2} — ABNORMAL LOW (ref >59)
Glucose: 156 mg/dL — ABNORMAL HIGH (ref 65–99)
Potassium: 4.6 mmol/L (ref 3.5–5.2)
Sodium: 134 mmol/L (ref 134–144)

## 2018-12-13 NOTE — Telephone Encounter (Signed)
I spoke with pt's wife. Pt is no longer keeping track of urine out put. She will encourage him to do this.  Pt has scales at home but has not been weighing.  Wife will try to have patient weigh every morning and keep record of weights She states pt's legs are not stinging today and she has been putting lotion on them.  Wife states they are looking into having home health nurse or aide come out to the house. I told her we would call once lab results are available.  Wife states it is OK to call pt with results but she would also like results called to her.

## 2018-12-14 ENCOUNTER — Other Ambulatory Visit: Payer: Self-pay | Admitting: *Deleted

## 2018-12-14 MED ORDER — TORSEMIDE 20 MG PO TABS: 40.0000 mg | ORAL_TABLET | Freq: Two times a day (BID) | ORAL | 1 refills | Status: DC

## 2018-12-14 NOTE — Telephone Encounter (Signed)
Notes recorded by Claude Manges, CMA on 12/14/2018 at 11:48 AM EST  Spoke with patient aware of changes and recommendations and verbalized understanding . Patient was also told about lab work to be drawn at appt on 12-20-18 with Purcell Nails NP [facility] due to these chagnes  ------  Notes recorded by Baldwin [place], PA-C on 12/14/2018 at 10:11 AM EST  Labs stable from last week  Reviewed with Dr. Caryl Comes  Increase his torsemide to 40mg  BID every other day starting today, second dose should be about 2:00PM  Keep appointment 12/20/2018 as scheduled, should have labs then as well   Thanks  renee   The above documentation was located on the lab results from 11/24.  This message has been completed.

## 2018-12-17 ENCOUNTER — Emergency Department (HOSPITAL_COMMUNITY): Payer: Medicare Other

## 2018-12-17 ENCOUNTER — Other Ambulatory Visit: Payer: Self-pay

## 2018-12-17 ENCOUNTER — Encounter (HOSPITAL_COMMUNITY): Payer: Self-pay

## 2018-12-17 ENCOUNTER — Inpatient Hospital Stay (HOSPITAL_COMMUNITY)
Admission: EM | Admit: 2018-12-17 | Discharge: 2018-12-23 | DRG: 291 | Disposition: A | Discharge status: Rehabilitation Facility | Payer: Medicare Other | Attending: Family Medicine | Admitting: Family Medicine

## 2018-12-17 DIAGNOSIS — Z20828 Contact with and (suspected) exposure to other viral communicable diseases: Secondary | ICD-10-CM | POA: Diagnosis present

## 2018-12-17 DIAGNOSIS — N183 Chronic kidney disease, stage 3 unspecified: Secondary | ICD-10-CM | POA: Diagnosis present

## 2018-12-17 DIAGNOSIS — Z95810 Presence of automatic (implantable) cardiac defibrillator: Secondary | ICD-10-CM

## 2018-12-17 DIAGNOSIS — I252 Old myocardial infarction: Secondary | ICD-10-CM

## 2018-12-17 DIAGNOSIS — R531 Weakness: Secondary | ICD-10-CM | POA: Diagnosis not present

## 2018-12-17 DIAGNOSIS — I25119 Atherosclerotic heart disease of native coronary artery with unspecified angina pectoris: Secondary | ICD-10-CM | POA: Diagnosis present

## 2018-12-17 DIAGNOSIS — I10 Essential (primary) hypertension: Secondary | ICD-10-CM | POA: Diagnosis present

## 2018-12-17 DIAGNOSIS — E871 Hypo-osmolality and hyponatremia: Secondary | ICD-10-CM | POA: Diagnosis present

## 2018-12-17 DIAGNOSIS — I130 Hypertensive heart and chronic kidney disease with heart failure and stage 1 through stage 4 chronic kidney disease, or unspecified chronic kidney disease: Principal | ICD-10-CM | POA: Diagnosis present

## 2018-12-17 DIAGNOSIS — N1832 Chronic kidney disease, stage 3b: Secondary | ICD-10-CM | POA: Diagnosis not present

## 2018-12-17 DIAGNOSIS — I5043 Acute on chronic combined systolic (congestive) and diastolic (congestive) heart failure: Secondary | ICD-10-CM | POA: Diagnosis present

## 2018-12-17 DIAGNOSIS — E785 Hyperlipidemia, unspecified: Secondary | ICD-10-CM | POA: Diagnosis present

## 2018-12-17 DIAGNOSIS — N179 Acute kidney failure, unspecified: Secondary | ICD-10-CM | POA: Diagnosis present

## 2018-12-17 DIAGNOSIS — N184 Chronic kidney disease, stage 4 (severe): Secondary | ICD-10-CM | POA: Diagnosis present

## 2018-12-17 DIAGNOSIS — I420 Dilated cardiomyopathy: Secondary | ICD-10-CM | POA: Diagnosis present

## 2018-12-17 DIAGNOSIS — F1010 Alcohol abuse, uncomplicated: Secondary | ICD-10-CM | POA: Diagnosis present

## 2018-12-17 DIAGNOSIS — L8990 Pressure ulcer of unspecified site, unspecified stage: Secondary | ICD-10-CM | POA: Insufficient documentation

## 2018-12-17 DIAGNOSIS — R4182 Altered mental status, unspecified: Secondary | ICD-10-CM | POA: Diagnosis not present

## 2018-12-17 DIAGNOSIS — I2510 Atherosclerotic heart disease of native coronary artery without angina pectoris: Secondary | ICD-10-CM | POA: Diagnosis present

## 2018-12-17 DIAGNOSIS — I959 Hypotension, unspecified: Secondary | ICD-10-CM | POA: Diagnosis not present

## 2018-12-17 DIAGNOSIS — Z7189 Other specified counseling: Secondary | ICD-10-CM

## 2018-12-17 DIAGNOSIS — T148XXA Other injury of unspecified body region, initial encounter: Secondary | ICD-10-CM

## 2018-12-17 DIAGNOSIS — L03119 Cellulitis of unspecified part of limb: Secondary | ICD-10-CM | POA: Diagnosis present

## 2018-12-17 DIAGNOSIS — S80812A Abrasion, left lower leg, initial encounter: Secondary | ICD-10-CM | POA: Diagnosis not present

## 2018-12-17 DIAGNOSIS — F419 Anxiety disorder, unspecified: Secondary | ICD-10-CM | POA: Diagnosis present

## 2018-12-17 DIAGNOSIS — F329 Major depressive disorder, single episode, unspecified: Secondary | ICD-10-CM | POA: Diagnosis present

## 2018-12-17 DIAGNOSIS — L89152 Pressure ulcer of sacral region, stage 2: Secondary | ICD-10-CM | POA: Diagnosis present

## 2018-12-17 DIAGNOSIS — R5381 Other malaise: Secondary | ICD-10-CM | POA: Diagnosis not present

## 2018-12-17 DIAGNOSIS — I5042 Chronic combined systolic (congestive) and diastolic (congestive) heart failure: Secondary | ICD-10-CM | POA: Diagnosis present

## 2018-12-17 DIAGNOSIS — S80811A Abrasion, right lower leg, initial encounter: Secondary | ICD-10-CM | POA: Diagnosis not present

## 2018-12-17 DIAGNOSIS — I472 Ventricular tachycardia: Secondary | ICD-10-CM | POA: Diagnosis present

## 2018-12-17 DIAGNOSIS — Z8249 Family history of ischemic heart disease and other diseases of the circulatory system: Secondary | ICD-10-CM

## 2018-12-17 DIAGNOSIS — E875 Hyperkalemia: Secondary | ICD-10-CM | POA: Diagnosis present

## 2018-12-17 DIAGNOSIS — I447 Left bundle-branch block, unspecified: Secondary | ICD-10-CM

## 2018-12-17 DIAGNOSIS — Z6837 Body mass index (BMI) 37.0-37.9, adult: Secondary | ICD-10-CM

## 2018-12-17 DIAGNOSIS — L03115 Cellulitis of right lower limb: Secondary | ICD-10-CM | POA: Diagnosis present

## 2018-12-17 DIAGNOSIS — F1020 Alcohol dependence, uncomplicated: Secondary | ICD-10-CM | POA: Diagnosis present

## 2018-12-17 DIAGNOSIS — Z7982 Long term (current) use of aspirin: Secondary | ICD-10-CM

## 2018-12-17 DIAGNOSIS — Z79899 Other long term (current) drug therapy: Secondary | ICD-10-CM

## 2018-12-17 DIAGNOSIS — R627 Adult failure to thrive: Secondary | ICD-10-CM | POA: Diagnosis present

## 2018-12-17 DIAGNOSIS — I255 Ischemic cardiomyopathy: Secondary | ICD-10-CM | POA: Diagnosis present

## 2018-12-17 DIAGNOSIS — I5082 Biventricular heart failure: Secondary | ICD-10-CM | POA: Diagnosis present

## 2018-12-17 DIAGNOSIS — L03116 Cellulitis of left lower limb: Secondary | ICD-10-CM | POA: Diagnosis present

## 2018-12-17 DIAGNOSIS — Z515 Encounter for palliative care: Secondary | ICD-10-CM

## 2018-12-17 DIAGNOSIS — Z833 Family history of diabetes mellitus: Secondary | ICD-10-CM

## 2018-12-17 DIAGNOSIS — Z955 Presence of coronary angioplasty implant and graft: Secondary | ICD-10-CM

## 2018-12-17 LAB — URINALYSIS, ROUTINE W REFLEX MICROSCOPIC
Bacteria, UA: NONE SEEN
Bilirubin Urine: NEGATIVE
Glucose, UA: NEGATIVE mg/dL
Ketones, ur: NEGATIVE mg/dL
Leukocytes,Ua: NEGATIVE
Nitrite: NEGATIVE
Protein, ur: NEGATIVE mg/dL
Specific Gravity, Urine: 1.013 (ref 1.005–1.030)
pH: 6 (ref 5.0–8.0)

## 2018-12-17 LAB — CBC
HCT: 51.2 % (ref 39.0–52.0)
Hemoglobin: 16.5 g/dL (ref 13.0–17.0)
MCH: 28.8 pg (ref 26.0–34.0)
MCHC: 32.2 g/dL (ref 30.0–36.0)
MCV: 89.5 fL (ref 80.0–100.0)
Platelets: 334 10*3/uL (ref 150–400)
RBC: 5.72 MIL/uL (ref 4.22–5.81)
RDW: 15.5 % (ref 11.5–15.5)
WBC: 14.8 10*3/uL — ABNORMAL HIGH (ref 4.0–10.5)
nRBC: 0 % (ref 0.0–0.2)

## 2018-12-17 LAB — BASIC METABOLIC PANEL
Anion gap: 16 — ABNORMAL HIGH (ref 5–15)
BUN: 41 mg/dL — ABNORMAL HIGH (ref 8–23)
CO2: 26 mmol/L (ref 22–32)
Calcium: 9.5 mg/dL (ref 8.9–10.3)
Chloride: 91 mmol/L — ABNORMAL LOW (ref 98–111)
Creatinine, Ser: 1.82 mg/dL — ABNORMAL HIGH (ref 0.61–1.24)
GFR calc Af Amer: 41 mL/min — ABNORMAL LOW (ref >60)
GFR calc non Af Amer: 36 mL/min — ABNORMAL LOW (ref >60)
Glucose, Bld: 141 mg/dL — ABNORMAL HIGH (ref 70–99)
Potassium: 5.3 mmol/L — ABNORMAL HIGH (ref 3.5–5.1)
Sodium: 133 mmol/L — ABNORMAL LOW (ref 135–145)

## 2018-12-17 MED ORDER — SODIUM CHLORIDE 0.9 % IV SOLN: 250.0000 mL | INTRAVENOUS | Status: DC | PRN

## 2018-12-17 MED ORDER — DIGOXIN 125 MCG PO TABS
0.1250 mg | ORAL_TABLET | Freq: Every day | ORAL | Status: DC
  Administered 2018-12-18 – 2018-12-20 (×3): 0.125 mg via ORAL
  Filled 2018-12-17 (×3): qty 1

## 2018-12-17 MED ORDER — CEPHALEXIN 250 MG PO CAPS
500.0000 mg | ORAL_CAPSULE | Freq: Two times a day (BID) | ORAL | Status: AC
  Administered 2018-12-18 (×2): 500 mg via ORAL
  Filled 2018-12-17 (×2): qty 2

## 2018-12-17 MED ORDER — SODIUM CHLORIDE 0.9% FLUSH
3.0000 mL | Freq: Two times a day (BID) | INTRAVENOUS | Status: DC
  Administered 2018-12-18 – 2018-12-22 (×3): 3 mL via INTRAVENOUS

## 2018-12-17 MED ORDER — SODIUM CHLORIDE 0.9 % IV BOLUS
250.0000 mL | Freq: Once | INTRAVENOUS | Status: AC
  Administered 2018-12-17: 250 mL via INTRAVENOUS

## 2018-12-17 MED ORDER — HEPARIN SODIUM (PORCINE) 5000 UNIT/ML IJ SOLN
5000.0000 [IU] | Freq: Three times a day (TID) | INTRAMUSCULAR | Status: DC
  Administered 2018-12-18: 5000 [IU] via SUBCUTANEOUS
  Filled 2018-12-17: qty 1

## 2018-12-17 MED ORDER — SODIUM CHLORIDE 0.9% FLUSH: 3.0000 mL | INTRAVENOUS | Status: DC | PRN

## 2018-12-17 MED ORDER — TICAGRELOR 90 MG PO TABS: 90.0000 mg | ORAL_TABLET | Freq: Two times a day (BID) | ORAL | Status: DC

## 2018-12-17 MED ORDER — LORAZEPAM 1 MG PO TABS: 1.0000 mg | ORAL_TABLET | ORAL | Status: AC | PRN

## 2018-12-17 MED ORDER — ATORVASTATIN CALCIUM 80 MG PO TABS
80.0000 mg | ORAL_TABLET | Freq: Every day | ORAL | Status: DC
  Administered 2018-12-18 – 2018-12-22 (×5): 80 mg via ORAL
  Filled 2018-12-17 (×6): qty 1

## 2018-12-17 MED ORDER — THIAMINE HCL 100 MG/ML IJ SOLN
100.0000 mg | Freq: Every day | INTRAMUSCULAR | Status: DC
  Filled 2018-12-17: qty 2

## 2018-12-17 MED ORDER — ACETAMINOPHEN 325 MG PO TABS
650.0000 mg | ORAL_TABLET | ORAL | Status: DC | PRN
  Administered 2018-12-20: 650 mg via ORAL
  Filled 2018-12-17: qty 2

## 2018-12-17 MED ORDER — LORAZEPAM 1 MG PO TABS
0.0000 mg | ORAL_TABLET | Freq: Two times a day (BID) | ORAL | Status: DC
  Administered 2018-12-20: 1 mg via ORAL
  Filled 2018-12-17 (×2): qty 1

## 2018-12-17 MED ORDER — FUROSEMIDE 10 MG/ML IJ SOLN
40.0000 mg | Freq: Once | INTRAMUSCULAR | Status: AC
  Administered 2018-12-18: 40 mg via INTRAVENOUS
  Filled 2018-12-17: qty 4

## 2018-12-17 MED ORDER — ADULT MULTIVITAMIN W/MINERALS CH
1.0000 | ORAL_TABLET | Freq: Every day | ORAL | Status: DC
  Administered 2018-12-18 – 2018-12-22 (×5): 1 via ORAL
  Filled 2018-12-17 (×5): qty 1

## 2018-12-17 MED ORDER — LORAZEPAM 2 MG/ML IJ SOLN: 1.0000 mg | INTRAMUSCULAR | Status: AC | PRN

## 2018-12-17 MED ORDER — SODIUM CHLORIDE 0.9% FLUSH
3.0000 mL | Freq: Two times a day (BID) | INTRAVENOUS | Status: DC
  Administered 2018-12-22 – 2018-12-23 (×2): 3 mL via INTRAVENOUS

## 2018-12-17 MED ORDER — FOLIC ACID 1 MG PO TABS
1.0000 mg | ORAL_TABLET | Freq: Every day | ORAL | Status: DC
  Administered 2018-12-18 – 2018-12-22 (×5): 1 mg via ORAL
  Filled 2018-12-17 (×5): qty 1

## 2018-12-17 MED ORDER — VITAMIN B-1 100 MG PO TABS
100.0000 mg | ORAL_TABLET | Freq: Every day | ORAL | Status: DC
  Administered 2018-12-18 – 2018-12-22 (×5): 100 mg via ORAL
  Filled 2018-12-17 (×5): qty 1

## 2018-12-17 MED ORDER — ONDANSETRON HCL 4 MG/2ML IJ SOLN: 4.0000 mg | Freq: Four times a day (QID) | INTRAMUSCULAR | Status: DC | PRN

## 2018-12-17 MED ORDER — LORAZEPAM 1 MG PO TABS: 0.0000 mg | ORAL_TABLET | Freq: Four times a day (QID) | ORAL | Status: DC

## 2018-12-17 MED ORDER — VENLAFAXINE HCL 75 MG PO TABS
75.0000 mg | ORAL_TABLET | Freq: Every day | ORAL | Status: DC
  Administered 2018-12-18 – 2018-12-22 (×5): 75 mg via ORAL
  Filled 2018-12-17 (×5): qty 1

## 2018-12-17 MED ORDER — ASPIRIN EC 81 MG PO TBEC
81.0000 mg | DELAYED_RELEASE_TABLET | Freq: Every day | ORAL | Status: DC
  Administered 2018-12-18 – 2018-12-23 (×6): 81 mg via ORAL
  Filled 2018-12-17 (×6): qty 1

## 2018-12-17 MED ORDER — METOPROLOL SUCCINATE ER 50 MG PO TB24
50.0000 mg | ORAL_TABLET | Freq: Every day | ORAL | Status: DC
  Administered 2018-12-18: 50 mg via ORAL
  Filled 2018-12-17: qty 1

## 2018-12-17 NOTE — ED Provider Notes (Signed)
[facility] EMERGENCY DEPARTMENT Provider Note   CSN: [id number] Arrival date & time: 12/17/18  1802     History   Chief Complaint Chief Complaint  Patient presents with   Weakness    HPI Leonard [name] is a 73 y.o. male.     HPI   He presents for evaluation of general weakness.  He had trouble getting up at all today and came here by EMS for evaluation.  He saw his cardiologist, on 12/05/2018, and subsequent to that he was placed on an increased dose of torsemide, doubling it every other day.  Currently he is taking torsemide 20 mg 1 day and 40 mg the next.  He has been getting gradually weaker, which causes him to have difficulty walking for the last 3 days.  He is taking his medications as prescribed.  His wife reports that he is urinating less than usual, and usually in the past he has been urinating between 3 and 600 cc/day there has been no fever, chills, cough, shortness of breath, chest pain, abdominal pain, back pain, focal weakness or paresthesia.  His behavior has been normal according to his wife.  No prior similar problems.  Past Medical History:  Diagnosis Date   AICD (automatic cardioverter/defibrillator) present 08/26/2015   Anxiety    Atherosclerotic heart disease of native coronary artery with angina pectoris ([place]) 07/2015   a. 07/2015 Posterior STEMI/PCI: LM nl, LAd 40ost, RI 40, RCA 139m (3.0x22 Resolute Integrity DES distal, 3.0x12 Resolute Integrity DES prox), 90d (PTCA), RPDA small, nl, RPLB2 100 - too small for PTCA. -- patent in f/u cath 08/2015 & 05/2016 - patent RCA stents (~10%), RI 40%, LM ~30% ost LAD ~40%.   Chronic combined systolic and diastolic CHF (congestive heart failure) ([place]) 07/2015   a. 07/2015 Ehco: EF 45-50%, basal-mid inf and infsept AK, inflat, apical inf, and apical septal HK, Gr1 DD, triv MR.;; b - Echo 05/2016 -> EF 20-25%, Gr 2 DD.  Severe diffuse HK disproportionately severe HK and thinning of apex, anterior  septum and anterior wall-with septal dyssynchrony.  PAP ~53 mmHg.   Hyperlipidemia with target low density lipoprotein (LDL) cholesterol less than 70 mg/dL    Hypertensive heart disease    Ischemic cardiomyopathy 07/2015 - 05/2016   a. 07/2015 Echo: EF 45-50%.; Follow-up Echo 08/23/2015: EF 50-55% with mild inferior hypokinesis --> 05/2016: EF 20-25%, diffuse HK. PAP ~53 mmHg.   Kidney disease, chronic, stage [name] (GFR 30-59 ml/min) 08/12/2015   Morbid obesity ([place])    ST elevation myocardial infarction (STEMI) of inferior wall ([place]) 08/12/2015   Subacute presentation for inferior MI with post infarction angina on the following day. EKG still shows injury current. Therefore decided to proceed with intervention of 100% mRCA thrombotic lesion.   Sustained ventricular fibrillation ([place]) 08/22/2015   Underwent EP evaluation with final ICD implantation.    Patient Active Problem List   Diagnosis Date Noted   Depression 06/14/2018   Need for shingles vaccine 06/14/2018   AKI (acute kidney injury) ([place]) 06/11/2016   S/P implantation of automatic cardioverter/defibrillator (AICD)    Grief 02/27/2016   Ischemic cardiomyopathy    Morbid obesity ([place])    Chronic combined systolic and diastolic CHF (congestive heart failure) ([place])    Sustained ventricular fibrillation ([place]) 08/20/2015   Elevated glucose 08/14/2015   ST elevation myocardial infarction (STEMI) of true posterior wall, subsequent episode of care [facility]) 08/12/2015   Coronary artery disease involving native coronary artery with angina  pectoris ([place]) 08/12/2015   Essential hypertension 02/12/2015   Hyperlipidemia LDL goal <70 02/12/2015   Erectile dysfunction 02/12/2015   BPH (benign prostatic hyperplasia) 02/12/2015    Past Surgical History:  Procedure Laterality Date   CARDIAC CATHETERIZATION N/A 08/12/2015   Procedure: Left Heart Cath and Coronary Angiography;  Surgeon: Leonie Man, MD;  Location: [facility]  CV LAB;  Service: Cardiovascular: Inferior STEMI: 100% mid RCA, 90% distal RCA, 100% RPL. 40% ostial and proximal LAD and branch of ramus.   CARDIAC CATHETERIZATION N/A 08/26/2015   Procedure: Left Heart Cath and Coronary Angiography;  Surgeon: Belva Crome, MD;  Location: [facility] CV LAB;  Service: Cardiovascular: To evaluate sustained VT. Widely patent RCA stent and distal PTCA site. Also patent RPL branch. LAD lesion appeared to be more consistent with 50-55% and 40%. Otherwise stable from previous cath. EF estimated 35-45%.   CARDIOVERSION N/A 08/23/2015   Procedure: CARDIOVERSION;  Surgeon: Deboraha Sprang, MD;  Location: [facility];  Service: Cardiovascular;  Laterality: N/A;   CORONARY ANGIOPLASTY WITH STENT PLACEMENT  08/12/2015   Mid RCA 100% reduced to 0% with 2 overlapping Resolute DES.  3.0 x 22 mm with a 3.0 x 12 mm prox overlarp (postdilated to 3.4 mm); PTCA of dRCA 90%. (Very dificult,complex case - tortuous, [place] RCA - unable to advance longer stents.  RPL2 noted to have thromboembolic 242% occlusion - unable to reach.   ELECTROPHYSIOLOGIC STUDY N/A 08/26/2015   Procedure: Electrophysiology Study;  Surgeon: Deboraha Sprang, MD;  Location: [facility] CV LAB;  Service: Cardiovascular;  Laterality: N/A;   EP IMPLANTABLE DEVICE N/A 08/26/2015   Procedure: ICD Implant;  Surgeon: Deboraha Sprang, MD;  Location: [facility] CV LAB;  Service: Cardiovascular;  Laterality: LEFT:  St Jude ICD, serial number [id number].    HEMORRHOID SURGERY     RIGHT/LEFT HEART CATH AND CORONARY ANGIOGRAPHY N/A 06/10/2016   Procedure: Right/Left Heart Cath and Coronary Angiography;  Surgeon: Martinique, Peter M, MD;  Location: [facility] INVASIVE CV LAB:  Nonobstructive CAD. RCA stents PATENT.  Ost-Prox RCA, 10 %. Ost LM 30 %. Ost LAD 40 %. RI, 40 %; High PCWP  & LVEDP (23 mmHg) ; PAP 43/23/31 mmHg. CO/CI 3.89 / 1/5 (Severely Reduced)   SKIN SURGERY     TRANSTHORACIC ECHOCARDIOGRAM  08/13/2015; 08/23/2015   a. Mild  concentric LVH. EF 45-50%. Basal-mid inferior and inferoseptal akinesis with hypokinesis of inferolateral and apical inferior wall. 1 DD. ;; b. Technically difficult study. Definity contrast administered.  Compared to a prior echo in 07/2015, the LVEF is slightly higher at 50-55% with mild inferior hypokinesis.   TRANSTHORACIC ECHOCARDIOGRAM  06/09/2016   EF  20-25%, GR 2 DD. Severe diffuse  hypokinesis with distinct regional wall motion abnormalities.   There is disproportionately severe hypokinesis and thinning of the apex, anterior septum and anterior wall (septal dyssynchrony). However, there is severe dyssynchrony , making it difficult to assess regional function.  PA P ~53 mmHg   TRANSTHORACIC ECHOCARDIOGRAM  01/2017   Severely reduced EF.  GI to DD.  Moderate MR.  Mild to have a moderately elevated PAP (peak 67mmHg)        Home Medications    Prior to Admission medications   Medication Sig Start Date End Date Taking? Authorizing Provider  aspirin EC 81 MG tablet Take 1 tablet (81 mg total) by mouth daily. 08/14/15   Arbutus Leas, NP  atorvastatin (LIPITOR) 80 MG tablet TAKE 1 TABLET  BY MOUTH DAILY AT 6 PM; MAKE ANNUAL APPOINTMENT WITH DOCTOR Medill FOR FUTURE REFILLS 12/12/18   Deboraha Sprang, MD  BRILINTA 90 MG TABS tablet TAKE 1 TABLET BY MOUTH TWICE DAILY(NEEDS OFFICE VISIT FOR REFILLS) 10/25/18   Deboraha Sprang, MD  cephALEXin (KEFLEX) 500 MG capsule Take 1 capsule (500 mg total) by mouth 2 (two) times daily. 12/05/18   Baldwin Jamaica, PA-C  Cholecalciferol (VITAMIN D) 2000 units CAPS Take 1 capsule by mouth daily.    [provider]  Coenzyme Q10 (COQ10) 200 MG CAPS Take 200 mg by mouth daily.    [provider]  digoxin (LANOXIN) 0.125 MG tablet TAKE 1 TABLET BY MOUTH DAILY 11/22/18   Leonie Man, MD  Investigational - Study Medication Take 1 tablet by mouth 2 (two) times daily. Study name: Galactic HF Study Additional study details: Omecamtiv Mecarbil or  Placebo 06/25/16   Larey Dresser, MD  MAGNESIUM-OXIDE 400 (241.3 Mg) MG tablet TAKE 1 TABLET BY MOUTH TWICE DAILY 11/09/18   Leonie Man, MD  metoprolol succinate (TOPROL-XL) 50 MG 24 hr tablet TAKE 1 TABLET BY MOUTH DAILY 10/24/18   Leonie Man, MD  Multiple Vitamin (MULTIVITAMIN) tablet Take 1 tablet by mouth daily.    [provider]  nitroGLYCERIN (NITROSTAT) 0.4 MG SL tablet Place 1 tablet (0.4 mg total) under the tongue every 5 (five) minutes as needed for chest pain. 08/14/15   Arbutus Leas, NP  Omega-3 Fatty Acids (FISH OIL) 1200 MG CPDR Take 1 tablet by mouth daily.    [provider]  sacubitril-valsartan (ENTRESTO) 49-51 MG Take 1 tablet by mouth 2 (two) times daily. 12/12/18   Deboraha Sprang, MD  spironolactone (ALDACTONE) 25 MG tablet Take 1 tablet (25 mg total) by mouth daily. Patient taking differently: Take 12.5 mg by mouth daily.  12/05/18   Baldwin Jamaica, PA-C  torsemide (DEMADEX) 20 MG tablet Take 2 tablets (40 mg total) by mouth 2 (two) times daily. every other day 12/14/18   Baldwin Jamaica, PA-C  venlafaxine [facility]) 75 MG tablet TAKE 1 TABLET BY MOUTH EVERY DAY 11/22/18   Girtha Rm, NP-C    Family History Family History  Problem Relation Age of Onset   Diabetes Mother    Heart disease Paternal Grandmother    Heart disease Paternal Grandfather     Social History Social History   Tobacco Use   Smoking status: Never Smoker   Smokeless tobacco: Never Used  Substance Use Topics   Alcohol use: Yes    Alcohol/week: 3.0 - 4.0 standard drinks    Types: 3 - 4 Shots of liquor per week    Comment: 3-4 glasses of scotch 3-4 days per week   Drug use: No     Allergies   Other and Sulfa antibiotics   Review of Systems Review of Systems  All other systems reviewed and are negative.    Physical Exam Updated Vital Signs BP (!) 176/159    Pulse (!) 116    Temp 98.1 F (36.7 C) (Oral)    Resp (!) 30    Ht 6\' 2"  (1.88  m)    Wt 132 kg    SpO2 92%    BMI 37.36 kg/m   Physical Exam Vitals signs and nursing note reviewed.  Constitutional:      General: He is not in acute distress.    Appearance: He is well-developed. He is not ill-appearing, toxic-appearing or  diaphoretic.  HENT:     Head: Normocephalic and atraumatic.     Right Ear: External ear normal.     Left Ear: External ear normal.  Eyes:     Conjunctiva/sclera: Conjunctivae normal.     Pupils: Pupils are equal, round, and reactive to light.  Neck:     Musculoskeletal: Normal range of motion and neck supple.     Trachea: Phonation normal.  Cardiovascular:     Rate and Rhythm: Normal rate and regular rhythm.     Heart sounds: Normal heart sounds.  Pulmonary:     Effort: Pulmonary effort is normal.     Breath sounds: Normal breath sounds.  Chest:     Chest wall: No tenderness.  Abdominal:     General: There is no distension.     Palpations: Abdomen is soft. There is no mass.     Tenderness: There is no abdominal tenderness.  Musculoskeletal: Normal range of motion.        General: Swelling present.     Right lower leg: Edema present.     Left lower leg: Edema present.  Skin:    General: Skin is warm and dry.     Comments: Numerous excoriations both legs bilaterally, some with signs of recent bleeding.  Patient states he scratches at the legs frequently and that this causes the excoriations.  Neurological:     Mental Status: He is alert and oriented to person, place, and time.     Cranial Nerves: No cranial nerve deficit.     Sensory: No sensory deficit.     Motor: No abnormal muscle tone.     Coordination: Coordination normal.  Psychiatric:        Mood and Affect: Mood normal.        Behavior: Behavior normal.        Thought Content: Thought content normal.        Judgment: Judgment normal.      ED Treatments / Results  Labs (all labs ordered are listed, but only abnormal results are displayed) Labs Reviewed  BASIC  METABOLIC PANEL - Abnormal; Notable for the following components:      Result Value   Sodium 133 (*)    Potassium 5.3 (*)    Chloride 91 (*)    Glucose, Bld 141 (*)    BUN 41 (*)    Creatinine, Ser 1.82 (*)    GFR calc non Af Amer 36 (*)    GFR calc Af Amer 41 (*)    Anion gap 16 (*)    All other components within normal limits  CBC - Abnormal; Notable for the following components:   WBC 14.8 (*)    All other components within normal limits  URINALYSIS, ROUTINE W REFLEX MICROSCOPIC - Abnormal; Notable for the following components:   Hgb urine dipstick SMALL (*)    All other components within normal limits  SARS CORONAVIRUS 2 (TAT 6-24 HRS)  CBG MONITORING, ED    EKG EKG Interpretation  Date/Time:  Saturday December 17 2018 18:21:44 EST Ventricular Rate:  111 PR Interval:    QRS Duration: 178 QT Interval:  394 QTC Calculation: 535 R Axis:   -68 Text Interpretation: Wide QRS rhythm with occasional Premature ventricular complexes Left axis deviation Left bundle branch block Abnormal ECG old LBBB, similar to old tracing Confirmed by Charlesetta Shanks [phone]) on 12/17/2018 6:28:51 PM   Radiology Dg Chest 2 View  Result Date: 12/17/2018 CLINICAL DATA:  Weakness EXAM: CHEST -  2 VIEW COMPARISON:  Radiograph Jun 09, 2016 FINDINGS: Hazy interstitial opacities cardiomegaly with cephalized vascularity and central cuffing. No pneumothorax. No visible effusion. AICD battery pack overlies the left chest wall with leads at the cardiac apex. The aorta is calcified. The remaining cardiomediastinal contours are unremarkable. No acute osseous or soft tissue abnormality. IMPRESSION: 1. Hazy interstitial opacities and central cuffing, consistent with interstitial edema. 2. Cardiomegaly.  No pleural effusion. Electronically Signed   By: Lovena Le M.D.   On: 12/17/2018 20:54   Ct Head Wo Contrast  Result Date: 12/17/2018 CLINICAL DATA:  73 year old male with altered mental status. EXAM: CT HEAD  WITHOUT CONTRAST TECHNIQUE: Contiguous axial images were obtained from the base of the skull through the vertex without intravenous contrast. COMPARISON:  None. FINDINGS: Brain: There is mild age-related atrophy and chronic microvascular ischemic changes. There is no acute intracranial hemorrhage. No mass effect or midline shift. No extra-axial fluid collection. Vascular: No hyperdense vessel or unexpected calcification. Skull: Normal. Negative for fracture or focal lesion. Sinuses/Orbits: Partial opacification of several ethmoid air cells. No air-fluid level. The mastoid air cells are clear. Other: None IMPRESSION: 1. No acute intracranial hemorrhage. 2. Mild age-related atrophy and chronic microvascular ischemic changes. Electronically Signed   By: Anner Crete M.D.   On: 12/17/2018 20:43    Procedures Procedures (including critical care time)  Medications Ordered in ED Medications  sodium chloride 0.9 % bolus 250 mL (has no administration in time range)     Initial Impression / Assessment and Plan / ED Course  I have reviewed the triage vital signs and the nursing notes.  Pertinent labs & imaging results that were available during my care of the patient were reviewed by me and considered in my medical decision making (see chart for details).  Clinical Course as of Dec 17 2122  Sat Dec 17, 2018  1938 Normal except white count high  CBC(!) [EW]  1938 Normal except sodium high, potassium high, chloride low, glucose high, BUN high, creatinine high, GFR low, anion gap high  Basic metabolic panel(!) [EW]  9937 Normal  Urinalysis, Routine w reflex microscopic(!) [EW]  2059 Per radiologist consistent with fluid overload/interstitial opacities  DG Chest 2 View [EW]  2100 Per radiologist no acute abnormalities.  CT HEAD WO CONTRAST [EW]    Clinical Course User Index [EW] Daleen Bo, MD        Patient Vitals for the past 24 hrs:  BP Temp Temp src Pulse Resp SpO2 Height Weight    12/17/18 2121 (!) 176/159 -- -- (!) 116 (!) 30 92 % -- --  12/17/18 2118 (!) 170/136 -- -- -- (!) 26 -- -- --  12/17/18 2116 113/84 -- -- -- (!) 27 -- -- --  12/17/18 2115 104/84 -- -- -- 19 -- -- --  12/17/18 2114 -- -- -- (!) 106 (!) 23 99 % -- --  12/17/18 2112 104/80 -- -- 99 19 94 % -- --  12/17/18 1945 95/64 -- -- 98 20 98 % -- --  12/17/18 1900 90/73 -- -- 97 19 99 % -- --  12/17/18 1817 98/74 98.1 F (36.7 C) Oral (!) 103 18 100 % 6\' 2"  (1.88 m) 132 kg    9:00 PM Reevaluation with update and discussion. After initial assessment and treatment, an updated evaluation reveals continues to be hypotensive.  Oxygenation normal.  Findings discussed and questions answered. Daleen Bo   Medical Decision Making: Nonspecific weakness.  Fluid volume status is unclear.  Patient being managed for congestive heart failure by recently increasing diuretic dosing.  Doubt ACS, or metabolic instability.  Renal function, creatinine and BUN, are at baseline.  Blood pressure persistently lower than usual by 20-30 points.  Nonspecific elevation of white count with both chest x-ray and urinalysis not indicate pneumonia.  Patient has excoriations, and a rash-like pattern, in his perineal area and legs, however these do not appear infectious.  No known sick contacts or suspected Covid infection.  Patient appears moderately deconditioned.  He may benefit from physical therapy evaluation.  Family member states that they are working on getting an aide in the home and I left chair to help with his mobilization.   Leonard [name] was evaluated in Emergency Department on 12/17/2018 for the symptoms described in the history of present illness. He was evaluated in the context of the global COVID-19 pandemic, which necessitated consideration that the patient might be at risk for infection with the SARS-CoV-2 virus that causes COVID-19. Institutional protocols and algorithms that pertain to the evaluation of patients at  risk for COVID-19 are in a state of rapid change based on information released by regulatory bodies including the CDC and federal and state organizations. These policies and algorithms were followed during the patient's care in the ED.  CRITICAL CARE-yes Performed by: Daleen Bo  Nursing Notes Reviewed/ Care Coordinated Applicable Imaging Reviewed Interpretation of Laboratory Data incorporated into ED treatment  9:10 PM-Consult complete with hospitalist. Patient case explained and discussed.  He agrees to admit patient for further evaluation and treatment. Call ended at 9:22 PM  Plan: Admit  Final Clinical Impressions(s) / ED Diagnoses   Final diagnoses:  Weakness  Hypotension, unspecified hypotension type  Excoriation    ED Discharge Orders    None       Daleen Bo, MD 12/17/18 2124

## 2018-12-17 NOTE — ED Triage Notes (Signed)
Pt BIB [facility] for eval of progressive weakness x 2 days, wife reports today he is unable to get up. EMS reports weak peripheral pulses, poor cap refill on their exam. Pt w/ hx of CHF.

## 2018-12-17 NOTE — H&P (Signed)
History and Physical    Leonard [name] FAO:[id number] DOB: [birth year] DOA: 12/17/2018  PCP: Girtha Rm, NP-C   Patient coming from: Home   Chief Complaint: Generalized weakness, can't get up   HPI: Leonard [name] is a 73 y.o. male with medical history significant for coronary artery disease, ischemic cardiomyopathy with EF 15 to 20%, chronic kidney disease stage [name], and excessive daily alcohol use, now presenting to the emergency department for evaluation of generalized weakness.  Patient has been using a cane and wheelchair, was too weak to stand up when he was in the cardiology clinic on 12/05/2018, as reportedly been experiencing progressive generalized weakness.  He appeared hypervolemic in the cardiology clinic on 12/05/2018, Aldactone was increased and Lasix switched to torsemide, and they discussed ICD upgrade to CRT device.  Patient's wife is at the bedside and adds to the history, noting insidiously worsening generalized weakness, now to the point where he cannot bear his own weight when standing.  She has tried to help him obtain daily weight measurements at home, but he has been too weak to stand on the scale for the past few days.  The patient himself has no complaints, and says that he feels well, denies any recent chest pain or palpitations, denies fevers or chills, and denies cough or shortness of breath.  He had serous weeping from his edematous lower extremities, was suspected to have a superimposed cellulitis, and he was started on Keflex earlier this month.  ED Course: Upon arrival to the ED, patient is found to be afebrile, saturating mid 90s on room air, slightly tachycardic, and with blood pressure 90/70.  EKG features chronic left bundle branch block and QTc interval of 535 ms.  Noncontrast head CT is negative for acute intracranial hemorrhage.  Chest x-ray is notable for hazy interstitial opacities consistent with interstitial edema.  Chemistry panel features a  mild hyponatremia and hyperkalemia with creatinine 1.82, up from 1.67 earlier this month.  CBC is notable for a chronic leukocytosis that appears stable.  Screening COVID-19 test has not yet resulted.  The patient was given 250 cc normal saline in the ED.  Review of Systems:  All other systems reviewed and apart from HPI, are negative.  Past Medical History:  Diagnosis Date  . AICD (automatic cardioverter/defibrillator) present 08/26/2015  . Anxiety   . Atherosclerotic heart disease of native coronary artery with angina pectoris ([place]) 07/2015   a. 07/2015 Posterior STEMI/PCI: LM nl, LAd 40ost, RI 40, RCA 172m (3.0x22 Resolute Integrity DES distal, 3.0x12 Resolute Integrity DES prox), 90d (PTCA), RPDA small, nl, RPLB2 100 - too small for PTCA. -- patent in f/u cath 08/2015 & 05/2016 - patent RCA stents (~10%), RI 40%, LM ~30% ost LAD ~40%.  . Chronic combined systolic and diastolic CHF (congestive heart failure) ([place]) 07/2015   a. 07/2015 Ehco: EF 45-50%, basal-mid inf and infsept AK, inflat, apical inf, and apical septal HK, Gr1 DD, triv MR.;; b - Echo 05/2016 -> EF 20-25%, Gr 2 DD.  Severe diffuse HK disproportionately severe HK and thinning of apex, anterior septum and anterior wall-with septal dyssynchrony.  PAP ~53 mmHg.  [name] Hyperlipidemia with target low density lipoprotein (LDL) cholesterol less than 70 mg/dL   . Hypertensive heart disease   . Ischemic cardiomyopathy 07/2015 - 05/2016   a. 07/2015 Echo: EF 45-50%.; Follow-up Echo 08/23/2015: EF 50-55% with mild inferior hypokinesis --> 05/2016: EF 20-25%, diffuse HK. PAP ~53 mmHg.  . Kidney disease, chronic, stage [name] (  GFR 30-59 ml/min) 08/12/2015  . Morbid obesity ([place])   . ST elevation myocardial infarction (STEMI) of inferior wall ([place]) 08/12/2015   Subacute presentation for inferior MI with post infarction angina on the following day. EKG still shows injury current. Therefore decided to proceed with intervention of 100% mRCA thrombotic lesion.   . Sustained ventricular fibrillation ([place]) 08/22/2015   Underwent EP evaluation with final ICD implantation.    Past Surgical History:  Procedure Laterality Date  . CARDIAC CATHETERIZATION N/A 08/12/2015   Procedure: Left Heart Cath and Coronary Angiography;  Surgeon: Leonie Man, Leonard;  Location: [facility];  Service: Cardiovascular: Inferior STEMI: 100% mid RCA, 90% distal RCA, 100% RPL. 40% ostial and proximal LAD and branch of ramus.  [name] CARDIAC CATHETERIZATION N/A 08/26/2015   Procedure: Left Heart Cath and Coronary Angiography;  Surgeon: Belva Crome, Leonard;  Location: [facility];  Service: Cardiovascular: To evaluate sustained VT. Widely patent RCA stent and distal PTCA site. Also patent RPL branch. LAD lesion appeared to be more consistent with 50-55% and 40%. Otherwise stable from previous cath. EF estimated 35-45%.  [name] CARDIOVERSION N/A 08/23/2015   Procedure: CARDIOVERSION;  Surgeon: Deboraha Sprang, Leonard;  Location: [facility];  Service: Cardiovascular;  Laterality: N/A;  . CORONARY ANGIOPLASTY WITH STENT PLACEMENT  08/12/2015   Mid RCA 100% reduced to 0% with 2 overlapping Resolute DES.  3.0 x 22 mm with a 3.0 x 12 mm prox overlarp (postdilated to 3.4 mm); PTCA of dRCA 90%. (Very dificult,complex case - tortuous, [place] RCA - unable to advance longer stents.  RPL2 noted to have thromboembolic 914% occlusion - unable to reach.  . ELECTROPHYSIOLOGIC STUDY N/A 08/26/2015   Procedure: Electrophysiology Study;  Surgeon: Deboraha Sprang, Leonard;  Location: [facility];  Service: Cardiovascular;  Laterality: N/A;  . EP IMPLANTABLE DEVICE N/A 08/26/2015   Procedure: ICD Implant;  Surgeon: Deboraha Sprang, Leonard;  Location: [facility];  Service: Cardiovascular;  Laterality: LEFT:  St Jude ICD, serial number [id number].   [name] HEMORRHOID SURGERY    . RIGHT/LEFT HEART CATH AND CORONARY ANGIOGRAPHY N/A 06/10/2016   Procedure: Right/Left Heart Cath and Coronary Angiography;  Surgeon:  Martinique, Peter M, Leonard;  Location: [facility] IN[facility]ASIVE [facility]:  Nonobstructive CAD. RCA stents PATENT.  Ost-Prox RCA, 10 %. Ost LM 30 %. Ost LAD 40 %. RI, 40 %; High PCWP  & LVEDP (23 mmHg) ; PAP 43/23/31 mmHg. CO/CI 3.89 / 1/5 (Severely Reduced)  . SKIN SURGERY    . TRANSTHORACIC ECHOCARDIOGRAM  08/13/2015; 08/23/2015   a. Mild concentric LVH. EF 45-50%. Basal-mid inferior and inferoseptal akinesis with hypokinesis of inferolateral and apical inferior wall. 1 DD. ;; b. Technically difficult study. Definity contrast administered.  Compared to a prior echo in 07/2015, the LVEF is slightly higher at 50-55% with mild inferior hypokinesis.  . TRANSTHORACIC ECHOCARDIOGRAM  06/09/2016   EF  20-25%, GR 2 DD. Severe diffuse  hypokinesis with distinct regional wall motion abnormalities.   There is disproportionately severe hypokinesis and thinning of the apex, anterior septum and anterior wall (septal dyssynchrony). However, there is severe dyssynchrony , making it difficult to assess regional function.  PA P ~53 mmHg  . TRANSTHORACIC ECHOCARDIOGRAM  01/2017   Severely reduced EF.  GI to DD.  Moderate MR.  Mild to have a moderately elevated PAP (peak 34mmHg)     reports that he has never smoked. He has never used smokeless tobacco.  He reports current alcohol use of about 3.0 - 4.0 standard drinks of alcohol per week. He reports that he does not use drugs.  Allergies  Allergen Reactions  . Clams [Shellfish Allergy] Shortness Of Breath, Itching and Swelling  . Shellfish-Derived Products Shortness Of Breath, Itching and Swelling  . Other Itching and Other (See Comments)    Ragweed causes watery eyes, itching, sneezing  . Sulfa Antibiotics Other (See Comments)    Pt unsure of reaction, mother told him he was allergic     Family History  Problem Relation Age of Onset  . Diabetes Mother   . Heart disease Paternal Grandmother   . Heart disease Paternal Grandfather      Prior to Admission medications   Medication  Sig Start Date End Date Taking? Authorizing Provider  aspirin EC 81 MG tablet Take 1 tablet (81 mg total) by mouth daily. 08/14/15   Arbutus Leas, NP  atorvastatin (LIPITOR) 80 MG tablet TAKE 1 TABLET BY MOUTH DAILY AT 6 PM; MAKE ANNUAL APPOINTMENT WITH DOCTOR [place] FOR FUTURE REFILLS 12/12/18   Deboraha Sprang, Leonard  BRILINTA 90 MG TABS tablet TAKE 1 TABLET BY MOUTH TWICE DAILY(NEEDS OFFICE VISIT FOR REFILLS) 10/25/18   Deboraha Sprang, Leonard  cephALEXin (KEFLEX) 500 MG capsule Take 1 capsule (500 mg total) by mouth 2 (two) times daily. 12/05/18   Baldwin Jamaica, PA-C  Cholecalciferol (VITAMIN D) 2000 units CAPS Take 1 capsule by mouth daily.    Provider, Historical, Leonard  Coenzyme Q10 (COQ10) 200 MG CAPS Take 200 mg by mouth daily.    Provider, Historical, Leonard  digoxin (LANOXIN) 0.125 MG tablet TAKE 1 TABLET BY MOUTH DAILY 11/22/18   Leonie Man, Leonard  Investigational - Study Medication Take 1 tablet by mouth 2 (two) times daily. Study name: Galactic HF Study Additional study details: Omecamtiv Mecarbil or Placebo 06/25/16   Larey Dresser, Leonard  MAGNESIUM-OXIDE 400 (241.3 Mg) MG tablet TAKE 1 TABLET BY MOUTH TWICE DAILY 11/09/18   Leonie Man, Leonard  metoprolol succinate (TOPROL-XL) 50 MG 24 hr tablet TAKE 1 TABLET BY MOUTH DAILY 10/24/18   Leonie Man, Leonard  Multiple Vitamin (MULTIVITAMIN) tablet Take 1 tablet by mouth daily.    Provider, Historical, Leonard  nitroGLYCERIN (NITROSTAT) 0.4 MG SL tablet Place 1 tablet (0.4 mg total) under the tongue every 5 (five) minutes as needed for chest pain. 08/14/15   Arbutus Leas, NP  Omega-3 Fatty Acids (FISH OIL) 1200 MG CPDR Take 1 tablet by mouth daily.    Provider, Historical, Leonard  sacubitril-valsartan (ENTRESTO) 49-51 MG Take 1 tablet by mouth 2 (two) times daily. 12/12/18   Deboraha Sprang, Leonard  spironolactone (ALDACTONE) 25 MG tablet Take 1 tablet (25 mg total) by mouth daily. Patient taking differently: Take 12.5 mg by mouth daily.  12/05/18   Baldwin Jamaica, PA-C  torsemide (DEMADEX) 20 MG tablet Take 2 tablets (40 mg total) by mouth 2 (two) times daily. every other day 12/14/18   Baldwin Jamaica, PA-C  venlafaxine [facility]) 75 MG tablet TAKE 1 TABLET BY MOUTH EVERY DAY 11/22/18   Girtha Rm, NP-C    Physical Exam: Vitals:   12/17/18 2115 12/17/18 2116 12/17/18 2118 12/17/18 2121  BP: 104/84 113/84 (!) 170/136 (!) 176/159  Pulse:    (!) 116  Resp: 19 (!) 27 (!) 26 (!) 30  Temp:      TempSrc:      SpO2:  92%  Weight:      Height:        Constitutional: NAD, calm  Eyes: PERTLA, lids and conjunctivae normal ENMT: Mucous membranes are moist. Posterior pharynx clear of any exudate or lesions.   Neck: normal, supple, no masses, no thyromegaly Respiratory: no wheezing, no rhonchi. No accessory muscle use.  Cardiovascular: S1 & S2 heard, regular rate and rhythm. Pitting edema to bilateral LE's. Abdomen: No distension, no tenderness, soft. Bowel sounds active.  Musculoskeletal: no clubbing / cyanosis. No joint deformity upper and lower extremities.   Skin: Serous weeping and excoriations about the distal LE's bilaterally. Warm, dry, well-perfused. Neurologic: CN 2-12 grossly intact. Sensation to light touch intact. Strength 4-5/5 in all 4 limbs.  Psychiatric: Alert and oriented to person, place, and situation. Calm, cooperative.       Labs on Admission: I have personally reviewed following labs and imaging studies  CBC: Recent Labs  Lab 12/17/18 1822  WBC 14.8*  HGB 16.5  HCT 51.2  MCV 89.5  PLT 448   Basic Metabolic Panel: Recent Labs  Lab 12/13/18 0938 12/17/18 1822  NA 134 133*  K 4.6 5.3*  CL 91* 91*  CO2 25 26  GLUCOSE 156* 141*  BUN 38* 41*  CREATININE 1.67* 1.82*  CALCIUM 9.3 9.5   GFR: Estimated Creatinine Clearance: 50.6 mL/min (A) (by C-G formula based on SCr of 1.82 mg/dL (H)). Liver Function Tests: No results for input(s): AST, ALT, ALKPHOS, BILITOT, PROT, ALBUMIN in the last 168 hours. No  results for input(s): LIPASE, AMYLASE in the last 168 hours. No results for input(s): AMMONIA in the last 168 hours. Coagulation Profile: No results for input(s): INR, PROTIME in the last 168 hours. Cardiac Enzymes: No results for input(s): CKTOTAL, CKMB, CKMBINDEX, TROPONINI in the last 168 hours. BNP (last 3 results) No results for input(s): PROBNP in the last 8760 hours. HbA1C: No results for input(s): HGBA1C in the last 72 hours. CBG: No results for input(s): GLUCAP in the last 168 hours. Lipid Profile: No results for input(s): CHOL, HDL, LDLCALC, TRIG, CHOLHDL, LDLDIRECT in the last 72 hours. Thyroid Function Tests: No results for input(s): TSH, T4TOTAL, FREET4, T3FREE, THYROIDAB in the last 72 hours. Anemia Panel: No results for input(s): VITAMINB12, FOLATE, FERRITIN, TIBC, IRON, RETICCTPCT in the last 72 hours. Urine analysis:    Component Value Date/Time   COLORURINE YELLOW 12/17/2018 2021   APPEARANCEUR CLEAR 12/17/2018 2021   LABSPEC 1.013 12/17/2018 2021   PHURINE 6.0 12/17/2018 2021   GLUCOSEU NEGATIVE 12/17/2018 2021   HGBUR SMALL (A) 12/17/2018 2021   BILIRUBINUR NEGATIVE 12/17/2018 2021   BILIRUBINUR n 04/18/2015 1106   [place] 12/17/2018 2021   PROTEINUR NEGATIVE 12/17/2018 2021   UROBILINOGEN negative 04/18/2015 1106   NITRITE NEGATIVE 12/17/2018 2021   LEUKOCYTESUR NEGATIVE 12/17/2018 2021   Sepsis Labs: @LABRCNTIP (procalcitonin:4,lacticidven:4) )No results found for this or any previous visit (from the past 240 hour(s)).   Radiological Exams on Admission: Dg Chest 2 View  Result Date: 12/17/2018 CLINICAL DATA:  Weakness EXAM: CHEST - 2 VIEW COMPARISON:  Radiograph Jun 09, 2016 FINDINGS: Hazy interstitial opacities cardiomegaly with cephalized vascularity and central cuffing. No pneumothorax. No visible effusion. AICD battery pack overlies the left chest wall with leads at the cardiac apex. The aorta is calcified. The remaining cardiomediastinal  contours are unremarkable. No acute osseous or soft tissue abnormality. IMPRESSION: 1. Hazy interstitial opacities and central cuffing, consistent with interstitial edema. 2. Cardiomegaly.  No pleural effusion. Electronically Signed  By: Lovena Le M.D.   On: 12/17/2018 20:54   Ct Head Wo Contrast  Result Date: 12/17/2018 CLINICAL DATA:  73 year old male with altered mental status. EXAM: CT HEAD WITHOUT CONTRAST TECHNIQUE: Contiguous axial images were obtained from the base of the skull through the vertex without intravenous contrast. COMPARISON:  None. FINDINGS: Brain: There is mild age-related atrophy and chronic microvascular ischemic changes. There is no acute intracranial hemorrhage. No mass effect or midline shift. No extra-axial fluid collection. Vascular: No hyperdense vessel or unexpected calcification. Skull: Normal. Negative for fracture or focal lesion. Sinuses/Orbits: Partial opacification of several ethmoid air cells. No air-fluid level. The mastoid air cells are clear. Other: None IMPRESSION: 1. No acute intracranial hemorrhage. 2. Mild age-related atrophy and chronic microvascular ischemic changes. Electronically Signed   By: Anner Crete M.D.   On: 12/17/2018 20:43    EKG: Independently reviewed. Sinus rhythm, chronic LBBB, QTc 535 ms, not significantly changed from prior.   Assessment/Plan   1. Generalized weakness  - Presents with insidiously worsening generalized weakness, now unable to stand  - Head CT is negative for acute findings  - DDx is broad and this could be related to worsening cardiomyopathy, vitamin-deficiency associated with his alcoholism, digoxin toxicity, myopathy, thyroid disease,  - Check CK, digoxin level, thiamine, B12, folate, and TSH - Continue supportive care, consult with PT    2. Acute on chronic combined systolic & diastolic CHF  - Patient is hypervolemic on admission with marked peripheral edema and interstitial edema on CXR  - This is  complicated by worsened CKD  - Plan to treat with Lasix 40 mg IV now, follow daily wt and I/O's, hold Entresto in light of increased creatinine, continue beta-blocker and digoxin as tolerated, and reassess in am   3. CKD stage [name]  - SCr is 1.82 on admission, up from 1.67 earlier this month  - Hold Entresto initially, renally-dose medications, and repeat chem panel in am    4. CAD  - No anginal complaints  - Continue ASA, Brilinta, statin, and beta-blocker    5. Lower extremity cellulitis  - Patient started Keflex recently for mild cellulitis involving lower extremities; will continue   6. Excessive alcohol use  - Patient has 5-7 drinks every night for many years  - He does not appear intoxicated or withdrawing in ED - He has been counseled to cut back but has not yet started to do this  - Monitor with CIWA, supplement vitamins    PPE: Mask, face shield  DVT prophylaxis: sq heparin  Code Status: Full  Family Communication: Wife updated at bedside Consults called: None  Admission status: Observation     Vianne Bulls, Leonard [facility] Pager [phone]  If 7PM-7AM, please contact night-coverage [email or web address] Password [facility]  12/17/2018, 9:44 PM

## 2018-12-17 NOTE — ED Notes (Signed)
Wife cell [phone]

## 2018-12-17 NOTE — ED Notes (Signed)
Bladder scan shows 20ml in bladder.

## 2018-12-18 ENCOUNTER — Observation Stay: Payer: Self-pay

## 2018-12-18 ENCOUNTER — Other Ambulatory Visit: Payer: Self-pay

## 2018-12-18 ENCOUNTER — Observation Stay (HOSPITAL_COMMUNITY): Payer: Medicare Other

## 2018-12-18 ENCOUNTER — Encounter (HOSPITAL_COMMUNITY): Payer: Self-pay | Admitting: Physician Assistant

## 2018-12-18 DIAGNOSIS — L03116 Cellulitis of left lower limb: Secondary | ICD-10-CM | POA: Diagnosis present

## 2018-12-18 DIAGNOSIS — Z7982 Long term (current) use of aspirin: Secondary | ICD-10-CM | POA: Diagnosis not present

## 2018-12-18 DIAGNOSIS — I361 Nonrheumatic tricuspid (valve) insufficiency: Secondary | ICD-10-CM

## 2018-12-18 DIAGNOSIS — Z91013 Allergy to seafood: Secondary | ICD-10-CM | POA: Diagnosis not present

## 2018-12-18 DIAGNOSIS — Z955 Presence of coronary angioplasty implant and graft: Secondary | ICD-10-CM | POA: Diagnosis not present

## 2018-12-18 DIAGNOSIS — F1020 Alcohol dependence, uncomplicated: Secondary | ICD-10-CM | POA: Diagnosis present

## 2018-12-18 DIAGNOSIS — Z882 Allergy status to sulfonamides status: Secondary | ICD-10-CM | POA: Diagnosis not present

## 2018-12-18 DIAGNOSIS — L89152 Pressure ulcer of sacral region, stage 2: Secondary | ICD-10-CM | POA: Diagnosis present

## 2018-12-18 DIAGNOSIS — I5042 Chronic combined systolic (congestive) and diastolic (congestive) heart failure: Secondary | ICD-10-CM | POA: Diagnosis not present

## 2018-12-18 DIAGNOSIS — I5043 Acute on chronic combined systolic (congestive) and diastolic (congestive) heart failure: Secondary | ICD-10-CM | POA: Diagnosis not present

## 2018-12-18 DIAGNOSIS — N183 Chronic kidney disease, stage 3 unspecified: Secondary | ICD-10-CM | POA: Diagnosis present

## 2018-12-18 DIAGNOSIS — I5082 Biventricular heart failure: Secondary | ICD-10-CM | POA: Diagnosis present

## 2018-12-18 DIAGNOSIS — N1832 Chronic kidney disease, stage 3b: Secondary | ICD-10-CM | POA: Diagnosis not present

## 2018-12-18 DIAGNOSIS — F419 Anxiety disorder, unspecified: Secondary | ICD-10-CM | POA: Diagnosis present

## 2018-12-18 DIAGNOSIS — E871 Hypo-osmolality and hyponatremia: Secondary | ICD-10-CM | POA: Diagnosis present

## 2018-12-18 DIAGNOSIS — I509 Heart failure, unspecified: Secondary | ICD-10-CM | POA: Diagnosis not present

## 2018-12-18 DIAGNOSIS — E785 Hyperlipidemia, unspecified: Secondary | ICD-10-CM | POA: Diagnosis present

## 2018-12-18 DIAGNOSIS — I25119 Atherosclerotic heart disease of native coronary artery with unspecified angina pectoris: Secondary | ICD-10-CM | POA: Diagnosis present

## 2018-12-18 DIAGNOSIS — I2510 Atherosclerotic heart disease of native coronary artery without angina pectoris: Secondary | ICD-10-CM | POA: Diagnosis present

## 2018-12-18 DIAGNOSIS — I959 Hypotension, unspecified: Secondary | ICD-10-CM | POA: Diagnosis present

## 2018-12-18 DIAGNOSIS — Z833 Family history of diabetes mellitus: Secondary | ICD-10-CM | POA: Diagnosis not present

## 2018-12-18 DIAGNOSIS — Z79899 Other long term (current) drug therapy: Secondary | ICD-10-CM | POA: Diagnosis not present

## 2018-12-18 DIAGNOSIS — N184 Chronic kidney disease, stage 4 (severe): Secondary | ICD-10-CM | POA: Diagnosis present

## 2018-12-18 DIAGNOSIS — F329 Major depressive disorder, single episode, unspecified: Secondary | ICD-10-CM | POA: Diagnosis present

## 2018-12-18 DIAGNOSIS — I255 Ischemic cardiomyopathy: Secondary | ICD-10-CM | POA: Diagnosis not present

## 2018-12-18 DIAGNOSIS — Z7189 Other specified counseling: Secondary | ICD-10-CM | POA: Diagnosis not present

## 2018-12-18 DIAGNOSIS — N179 Acute kidney failure, unspecified: Secondary | ICD-10-CM | POA: Diagnosis present

## 2018-12-18 DIAGNOSIS — I5030 Unspecified diastolic (congestive) heart failure: Secondary | ICD-10-CM

## 2018-12-18 DIAGNOSIS — I340 Nonrheumatic mitral (valve) insufficiency: Secondary | ICD-10-CM

## 2018-12-18 DIAGNOSIS — Z95810 Presence of automatic (implantable) cardiac defibrillator: Secondary | ICD-10-CM | POA: Diagnosis not present

## 2018-12-18 DIAGNOSIS — R627 Adult failure to thrive: Secondary | ICD-10-CM | POA: Diagnosis present

## 2018-12-18 DIAGNOSIS — E875 Hyperkalemia: Secondary | ICD-10-CM

## 2018-12-18 DIAGNOSIS — I252 Old myocardial infarction: Secondary | ICD-10-CM | POA: Diagnosis not present

## 2018-12-18 DIAGNOSIS — I447 Left bundle-branch block, unspecified: Secondary | ICD-10-CM | POA: Diagnosis not present

## 2018-12-18 DIAGNOSIS — I10 Essential (primary) hypertension: Secondary | ICD-10-CM

## 2018-12-18 DIAGNOSIS — Z20828 Contact with and (suspected) exposure to other viral communicable diseases: Secondary | ICD-10-CM | POA: Diagnosis present

## 2018-12-18 DIAGNOSIS — I472 Ventricular tachycardia: Secondary | ICD-10-CM | POA: Diagnosis present

## 2018-12-18 DIAGNOSIS — Z8249 Family history of ischemic heart disease and other diseases of the circulatory system: Secondary | ICD-10-CM | POA: Diagnosis not present

## 2018-12-18 DIAGNOSIS — I130 Hypertensive heart and chronic kidney disease with heart failure and stage 1 through stage 4 chronic kidney disease, or unspecified chronic kidney disease: Secondary | ICD-10-CM | POA: Diagnosis present

## 2018-12-18 DIAGNOSIS — R531 Weakness: Secondary | ICD-10-CM | POA: Diagnosis not present

## 2018-12-18 DIAGNOSIS — Z7902 Long term (current) use of antithrombotics/antiplatelets: Secondary | ICD-10-CM | POA: Diagnosis not present

## 2018-12-18 DIAGNOSIS — L8990 Pressure ulcer of unspecified site, unspecified stage: Secondary | ICD-10-CM | POA: Insufficient documentation

## 2018-12-18 DIAGNOSIS — R5381 Other malaise: Secondary | ICD-10-CM | POA: Diagnosis present

## 2018-12-18 DIAGNOSIS — Z515 Encounter for palliative care: Secondary | ICD-10-CM | POA: Diagnosis not present

## 2018-12-18 DIAGNOSIS — L03115 Cellulitis of right lower limb: Secondary | ICD-10-CM | POA: Diagnosis present

## 2018-12-18 LAB — T4, FREE: Free T4: 1.09 ng/dL (ref 0.61–1.12)

## 2018-12-18 LAB — BASIC METABOLIC PANEL
Anion gap: 17 — ABNORMAL HIGH (ref 5–15)
BUN: 42 mg/dL — ABNORMAL HIGH (ref 8–23)
CO2: 26 mmol/L (ref 22–32)
Calcium: 9.5 mg/dL (ref 8.9–10.3)
Chloride: 90 mmol/L — ABNORMAL LOW (ref 98–111)
Creatinine, Ser: 1.86 mg/dL — ABNORMAL HIGH (ref 0.61–1.24)
GFR calc Af Amer: 40 mL/min — ABNORMAL LOW (ref >60)
GFR calc non Af Amer: 35 mL/min — ABNORMAL LOW (ref >60)
Glucose, Bld: 103 mg/dL — ABNORMAL HIGH (ref 70–99)
Potassium: 5.2 mmol/L — ABNORMAL HIGH (ref 3.5–5.1)
Sodium: 133 mmol/L — ABNORMAL LOW (ref 135–145)

## 2018-12-18 LAB — CBC
HCT: 49.8 % (ref 39.0–52.0)
Hemoglobin: 15.6 g/dL (ref 13.0–17.0)
MCH: 28.4 pg (ref 26.0–34.0)
MCHC: 31.3 g/dL (ref 30.0–36.0)
MCV: 90.7 fL (ref 80.0–100.0)
Platelets: 335 10*3/uL (ref 150–400)
RBC: 5.49 MIL/uL (ref 4.22–5.81)
RDW: 15.6 % — ABNORMAL HIGH (ref 11.5–15.5)
WBC: 14.8 10*3/uL — ABNORMAL HIGH (ref 4.0–10.5)
nRBC: 0 % (ref 0.0–0.2)

## 2018-12-18 LAB — HEPATIC FUNCTION PANEL
ALT: 13 U/L (ref 0–44)
AST: 22 U/L (ref 15–41)
Albumin: 3.1 g/dL — ABNORMAL LOW (ref 3.5–5.0)
Alkaline Phosphatase: 111 U/L (ref 38–126)
Bilirubin, Direct: 0.5 mg/dL — ABNORMAL HIGH (ref 0.0–0.2)
Indirect Bilirubin: 1.5 mg/dL — ABNORMAL HIGH (ref 0.3–0.9)
Total Bilirubin: 2 mg/dL — ABNORMAL HIGH (ref 0.3–1.2)
Total Protein: 6.4 g/dL — ABNORMAL LOW (ref 6.5–8.1)

## 2018-12-18 LAB — VITAMIN B12: Vitamin B-12: 580 pg/mL (ref 180–914)

## 2018-12-18 LAB — TSH: TSH: 9.093 u[IU]/mL — ABNORMAL HIGH (ref 0.350–4.500)

## 2018-12-18 LAB — MRSA PCR SCREENING: MRSA by PCR: NEGATIVE

## 2018-12-18 LAB — CK: Total CK: 119 U/L (ref 49–397)

## 2018-12-18 LAB — ECHOCARDIOGRAM COMPLETE
Height: 73.5 in
Weight: 4617.31 oz

## 2018-12-18 LAB — BRAIN NATRIURETIC PEPTIDE: B Natriuretic Peptide: 2328.4 pg/mL — ABNORMAL HIGH (ref 0.0–100.0)

## 2018-12-18 LAB — MAGNESIUM: Magnesium: 2.3 mg/dL (ref 1.7–2.4)

## 2018-12-18 LAB — DIGOXIN LEVEL: Digoxin Level: <0.2 ng/mL — ABNORMAL LOW (ref 0.8–2.0)

## 2018-12-18 LAB — SARS CORONAVIRUS 2 (TAT 6-24 HRS): SARS Coronavirus 2: NEGATIVE

## 2018-12-18 LAB — PHOSPHORUS: Phosphorus: 4.6 mg/dL (ref 2.5–4.6)

## 2018-12-18 MED ORDER — SODIUM ZIRCONIUM CYCLOSILICATE 10 G PO PACK
10.0000 g | PACK | Freq: Two times a day (BID) | ORAL | Status: AC
  Administered 2018-12-18 (×2): 10 g via ORAL
  Filled 2018-12-18 (×2): qty 1

## 2018-12-18 MED ORDER — FUROSEMIDE 10 MG/ML IJ SOLN
80.0000 mg | Freq: Two times a day (BID) | INTRAMUSCULAR | Status: DC
  Administered 2018-12-18 – 2018-12-19 (×3): 80 mg via INTRAVENOUS
  Filled 2018-12-18 (×3): qty 8

## 2018-12-18 MED ORDER — FUROSEMIDE 10 MG/ML IJ SOLN: 40.0000 mg | Freq: Two times a day (BID) | INTRAMUSCULAR | Status: DC

## 2018-12-18 MED ORDER — HEPARIN SODIUM (PORCINE) 5000 UNIT/ML IJ SOLN
5000.0000 [IU] | Freq: Three times a day (TID) | INTRAMUSCULAR | Status: DC
  Administered 2018-12-18 – 2018-12-23 (×15): 5000 [IU] via SUBCUTANEOUS
  Filled 2018-12-18 (×16): qty 1

## 2018-12-18 MED ORDER — CHLORHEXIDINE GLUCONATE CLOTH 2 % EX PADS
6.0000 | MEDICATED_PAD | Freq: Every day | CUTANEOUS | Status: DC
  Administered 2018-12-18 – 2018-12-22 (×5): 6 via TOPICAL

## 2018-12-18 MED ORDER — SODIUM CHLORIDE 0.9% FLUSH: 10.0000 mL | INTRAVENOUS | Status: DC | PRN

## 2018-12-18 MED ORDER — SODIUM CHLORIDE 0.9% FLUSH
10.0000 mL | Freq: Two times a day (BID) | INTRAVENOUS | Status: DC
  Administered 2018-12-18: 10 mL
  Administered 2018-12-19: 20 mL
  Administered 2018-12-20 – 2018-12-23 (×5): 10 mL

## 2018-12-18 MED ORDER — CEPHALEXIN 250 MG PO CAPS
500.0000 mg | ORAL_CAPSULE | Freq: Two times a day (BID) | ORAL | Status: DC
  Administered 2018-12-18 – 2018-12-21 (×7): 500 mg via ORAL
  Filled 2018-12-18 (×8): qty 2

## 2018-12-18 MED ORDER — TICAGRELOR 90 MG PO TABS
90.0000 mg | ORAL_TABLET | Freq: Two times a day (BID) | ORAL | Status: DC
  Administered 2018-12-18 – 2018-12-23 (×11): 90 mg via ORAL
  Filled 2018-12-18 (×11): qty 1

## 2018-12-18 NOTE — ED Notes (Signed)
ED TO INPATIENT HANDOFF REPORT  ED Nurse Name and Phone #:  [phone]  S Name/Age/Gender Leonard [name] 73 y.o. male Room/Bed: 013C/013C  Code Status   Code Status: Full Code  Home/SNF/Other Home Patient oriented to: self, place, situation and time Is this baseline? Yes   Triage Complete: Triage complete  Chief Complaint Weakness  Triage Note Pt BIB [facility] for eval of progressive weakness x 2 days, wife reports today he is unable to get up. EMS reports weak peripheral pulses, poor cap refill on their exam. Pt w/ hx of CHF.    Allergies Allergies  Allergen Reactions  . Clams [Shellfish Allergy] Shortness Of Breath, Itching and Swelling  . Shellfish-Derived Products Shortness Of Breath, Itching and Swelling  . Other Itching and Other (See Comments)    Ragweed causes watery eyes, itching, sneezing  . Sulfa Antibiotics Other (See Comments)    Pt unsure of reaction, mother told him he was allergic     Level of Care/Admitting Diagnosis ED Disposition    ED Disposition Condition [place]: [place] [[id number]]  Level of Care: Telemetry Cardiac [103]  I expect the patient will be discharged within 24 hours: Yes  LOW acuity---Tx typically complete <24 hrs---ACUTE conditions typically can be evaluated <24 hours---LABS likely to return to acceptable levels <24 hours---IS near functional baseline---EXPECTED to return to current living arrangement---NOT newly hypoxic: Does not meet criteria for 5C-Observation unit  Covid Evaluation: Asymptomatic Screening Protocol (No Symptoms)  Diagnosis: Generalized weakness [944967]  Admitting Physician: Vianne Bulls [[id number]]  Attending Physician: Vianne Bulls [[id number]]  PT Class (Do Not Modify): Observation [104]  PT Acc Code (Do Not Modify): Observation [10022]       B Medical/Surgery History Past Medical History:  Diagnosis Date  . AICD (automatic cardioverter/defibrillator)  present 08/26/2015  . Anxiety   . Atherosclerotic heart disease of native coronary artery with angina pectoris ([place]) 07/2015   a. 07/2015 Posterior STEMI/PCI: LM nl, LAd 40ost, RI 40, RCA 144m (3.0x22 Resolute Integrity DES distal, 3.0x12 Resolute Integrity DES prox), 90d (PTCA), RPDA small, nl, RPLB2 100 - too small for PTCA. -- patent in f/u cath 08/2015 & 05/2016 - patent RCA stents (~10%), RI 40%, LM ~30% ost LAD ~40%.  . Chronic combined systolic and diastolic CHF (congestive heart failure) ([place]) 07/2015   a. 07/2015 [facility]: EF 45-50%, basal-mid inf and infsept AK, inflat, apical inf, and apical septal HK, Gr1 DD, triv MR.;; b - Echo 05/2016 -> EF 20-25%, Gr 2 DD.  Severe diffuse HK disproportionately severe HK and thinning of apex, anterior septum and anterior wall-with septal dyssynchrony.  PAP ~53 mmHg.  [name] Hyperlipidemia with target low density lipoprotein (LDL) cholesterol less than 70 mg/dL   . Hypertensive heart disease   . Ischemic cardiomyopathy 07/2015 - 05/2016   a. 07/2015 Echo: EF 45-50%.; Follow-up Echo 08/23/2015: EF 50-55% with mild inferior hypokinesis --> 05/2016: EF 20-25%, diffuse HK. PAP ~53 mmHg.  . Kidney disease, chronic, stage [name] (GFR 30-59 ml/min) 08/12/2015  . Morbid obesity ([place])   . ST elevation myocardial infarction (STEMI) of inferior wall ([place]) 08/12/2015   Subacute presentation for inferior MI with post infarction angina on the following day. EKG still shows injury current. Therefore decided to proceed with intervention of 100% mRCA thrombotic lesion.  . Sustained ventricular fibrillation ([place]) 08/22/2015   Underwent EP evaluation with final ICD implantation.   Past Surgical History:  Procedure Laterality Date  .  CARDIAC CATHETERIZATION N/A 08/12/2015   Procedure: Left Heart Cath and Coronary Angiography;  Surgeon: Leonie Man, MD;  Location: [facility];  Service: Cardiovascular: Inferior STEMI: 100% mid RCA, 90% distal RCA, 100% RPL. 40% ostial and proximal  LAD and branch of ramus.  [name] CARDIAC CATHETERIZATION N/A 08/26/2015   Procedure: Left Heart Cath and Coronary Angiography;  Surgeon: Belva Crome, MD;  Location: [facility];  Service: Cardiovascular: To evaluate sustained VT. Widely patent RCA stent and distal PTCA site. Also patent RPL branch. LAD lesion appeared to be more consistent with 50-55% and 40%. Otherwise stable from previous cath. EF estimated 35-45%.  [name] CARDIOVERSION N/A 08/23/2015   Procedure: CARDIOVERSION;  Surgeon: Deboraha Sprang, MD;  Location: [facility];  Service: Cardiovascular;  Laterality: N/A;  . CORONARY ANGIOPLASTY WITH STENT PLACEMENT  08/12/2015   Mid RCA 100% reduced to 0% with 2 overlapping Resolute DES.  3.0 x 22 mm with a 3.0 x 12 mm prox overlarp (postdilated to 3.4 mm); PTCA of dRCA 90%. (Very dificult,complex case - tortuous, [place] RCA - unable to advance longer stents.  RPL2 noted to have thromboembolic 637% occlusion - unable to reach.  . ELECTROPHYSIOLOGIC STUDY N/A 08/26/2015   Procedure: Electrophysiology Study;  Surgeon: Deboraha Sprang, MD;  Location: [facility];  Service: Cardiovascular;  Laterality: N/A;  . EP IMPLANTABLE DEVICE N/A 08/26/2015   Procedure: ICD Implant;  Surgeon: Deboraha Sprang, MD;  Location: [facility];  Service: Cardiovascular;  Laterality: LEFT:  St Jude ICD, serial number [id number].   [name] HEMORRHOID SURGERY    . RIGHT/LEFT HEART CATH AND CORONARY ANGIOGRAPHY N/A 06/10/2016   Procedure: Right/Left Heart Cath and Coronary Angiography;  Surgeon: Martinique, Peter M, MD;  Location: [facility]:  Nonobstructive CAD. RCA stents PATENT.  Ost-Prox RCA, 10 %. Ost LM 30 %. Ost LAD 40 %. RI, 40 %; High PCWP  & LVEDP (23 mmHg) ; PAP 43/23/31 mmHg. CO/CI 3.89 / 1/5 (Severely Reduced)  . SKIN SURGERY    . TRANSTHORACIC ECHOCARDIOGRAM  08/13/2015; 08/23/2015   a. Mild concentric LVH. EF 45-50%. Basal-mid inferior and inferoseptal akinesis with hypokinesis of inferolateral and apical  inferior wall. 1 DD. ;; b. Technically difficult study. Definity contrast administered.  Compared to a prior echo in 07/2015, the LVEF is slightly higher at 50-55% with mild inferior hypokinesis.  . TRANSTHORACIC ECHOCARDIOGRAM  06/09/2016   EF  20-25%, GR 2 DD. Severe diffuse  hypokinesis with distinct regional wall motion abnormalities.   There is disproportionately severe hypokinesis and thinning of the apex, anterior septum and anterior wall (septal dyssynchrony). However, there is severe dyssynchrony , making it difficult to assess regional function.  PA P ~53 mmHg  . TRANSTHORACIC ECHOCARDIOGRAM  01/2017   Severely reduced EF.  GI to DD.  Moderate MR.  Mild to have a moderately elevated PAP (peak 3mmHg)     A IV Location/Drains/Wounds Patient Lines/Drains/Airways Status   Active Line/Drains/Airways    Name:   Placement date:   Placement time:   Site:   Days:   Peripheral IV 02/04/17 Right Hand   02/04/17    0902    Hand   682   Peripheral IV 12/17/18 Right Antecubital   12/17/18    2155    Antecubital   1   Incision (Closed) 08/26/15 Chest Left   08/26/15    1730     1210  Intake/Output Last 24 hours  Intake/Output Summary (Last 24 hours) at 12/18/2018 0500 Last data filed at 12/18/2018 0443 Gross per 24 hour  Intake -  Output 220 ml  Net -220 ml    Labs/Imaging Results for orders placed or performed during the hospital encounter of 12/17/18 (from the past 48 hour(s))  Basic metabolic panel     Status: Abnormal   Collection Time: 12/17/18  6:22 PM  Result Value Ref Range   Sodium 133 (L) 135 - 145 mmol/L   Potassium 5.3 (H) 3.5 - 5.1 mmol/L   Chloride 91 (L) 98 - 111 mmol/L   CO2 26 22 - 32 mmol/L   Glucose, Bld 141 (H) 70 - 99 mg/dL   BUN 41 (H) 8 - 23 mg/dL   Creatinine, Ser 1.82 (H) 0.61 - 1.24 mg/dL   Calcium 9.5 8.9 - 10.3 mg/dL   GFR calc non Af Amer 36 (L) >60 mL/min   GFR calc Af Amer 41 (L) >60 mL/min   Anion gap 16 (H) 5 - 15    Comment: Performed  at [facility], [address], [place], [place] [address]  CBC     Status: Abnormal   Collection Time: 12/17/18  6:22 PM  Result Value Ref Range   WBC 14.8 (H) 4.0 - 10.5 K/uL   RBC 5.72 4.22 - 5.81 MIL/uL   Hemoglobin 16.5 13.0 - 17.0 g/dL   HCT 51.2 39.0 - 52.0 %   MCV 89.5 80.0 - 100.0 fL   MCH 28.8 26.0 - 34.0 pg   MCHC 32.2 30.0 - 36.0 g/dL   RDW 15.5 11.5 - 15.5 %   Platelets 334 150 - 400 K/uL   nRBC 0.0 0.0 - 0.2 %    Comment: Performed at [facility], [place] [address], [place], [place] [address]  Urinalysis, Routine w reflex microscopic     Status: Abnormal   Collection Time: 12/17/18  8:21 PM  Result Value Ref Range   Color, Urine YELLOW YELLOW   APPearance CLEAR CLEAR   Specific Gravity, Urine 1.013 1.005 - 1.030   pH 6.0 5.0 - 8.0   Glucose, UA NEGATIVE NEGATIVE mg/dL   Hgb urine dipstick SMALL (A) NEGATIVE   Bilirubin Urine NEGATIVE NEGATIVE   Ketones, ur NEGATIVE NEGATIVE mg/dL   Protein, ur NEGATIVE NEGATIVE mg/dL   Nitrite NEGATIVE NEGATIVE   Leukocytes,Ua NEGATIVE NEGATIVE   RBC / HPF 0-5 0 - 5 RBC/hpf   WBC, UA 0-5 0 - 5 WBC/hpf   Bacteria, UA NONE SEEN NONE SEEN   Squamous Epithelial / LPF 0-5 0 - 5   Mucus PRESENT    Hyaline Casts, UA PRESENT     Comment: Performed at [facility], [place] [address], [place], [place] [address]  SARS CORONAVIRUS 2 (TAT 6-24 HRS) Nasopharyngeal Nasopharyngeal Swab     Status: None   Collection Time: 12/17/18  9:40 PM   Specimen: Nasopharyngeal Swab  Result Value Ref Range   SARS Coronavirus 2 NEGATIVE NEGATIVE    Comment: (NOTE) SARS-CoV-2 target nucleic acids are NOT DETECTED. The SARS-CoV-2 RNA is generally detectable in upper and lower respiratory specimens during the acute phase of infection. Negative results do not preclude SARS-CoV-2 infection, do not rule out co-infections with other pathogens, and should not be used as the sole basis for treatment or other patient management decisions. Negative  results must be combined with clinical observations, patient history, and epidemiological information. The expected result is  Negative. Fact Sheet for Patients: SugarRoll.be Fact Sheet for Healthcare Providers: https://www.woods-mathews.com/ This test is not yet approved or cleared by the [place] FDA and  has been authorized for detection and/or diagnosis of SARS-CoV-2 by FDA under an Emergency Use Authorization (EUA). This EUA will remain  in effect (meaning this test can be used) for the duration of the COVID-19 declaration under Section 56 4(b)(1) of the Act, 21 U.S.C. section 360bbb-3(b)(1), unless the authorization is terminated or revoked sooner. Performed at [facility], [place] [address] [place], [place] [address]    Dg Chest 2 View  Result Date: 12/17/2018 CLINICAL DATA:  Weakness EXAM: CHEST - 2 VIEW COMPARISON:  Radiograph Jun 09, 2016 FINDINGS: Hazy interstitial opacities cardiomegaly with cephalized vascularity and central cuffing. No pneumothorax. No visible effusion. AICD battery pack overlies the left chest wall with leads at the cardiac apex. The aorta is calcified. The remaining cardiomediastinal contours are unremarkable. No acute osseous or soft tissue abnormality. IMPRESSION: 1. Hazy interstitial opacities and central cuffing, consistent with interstitial edema. 2. Cardiomegaly.  No pleural effusion. Electronically Signed   By: Lovena Le M.D.   On: 12/17/2018 20:54   Ct Head Wo Contrast  Result Date: 12/17/2018 CLINICAL DATA:  73 year old male with altered mental status. EXAM: CT HEAD WITHOUT CONTRAST TECHNIQUE: Contiguous axial images were obtained from the base of the skull through the vertex without intravenous contrast. COMPARISON:  None. FINDINGS: Brain: There is mild age-related atrophy and chronic microvascular ischemic changes. There is no acute intracranial hemorrhage. No mass effect or midline shift. No  extra-axial fluid collection. Vascular: No hyperdense vessel or unexpected calcification. Skull: Normal. Negative for fracture or focal lesion. Sinuses/Orbits: Partial opacification of several ethmoid air cells. No air-fluid level. The mastoid air cells are clear. Other: None IMPRESSION: 1. No acute intracranial hemorrhage. 2. Mild age-related atrophy and chronic microvascular ischemic changes. Electronically Signed   By: Anner Crete M.D.   On: 12/17/2018 20:43    Pending Labs Unresulted Labs (From admission, onward)    Start     Ordered   12/18/18 9476  Basic metabolic panel  Daily,   R     12/17/18 2319   12/18/18 0500  Magnesium  Tomorrow morning,   R     12/17/18 2319   12/18/18 0500  Phosphorus  Tomorrow morning,   R     12/17/18 2319   12/18/18 0500  CBC  Tomorrow morning,   R     12/17/18 2319   12/18/18 0500  Digoxin level  Tomorrow morning,   R     12/17/18 2205   12/17/18 2143  Vitamin B1  Add-on,   AD     12/17/18 2142   12/17/18 2131  CK  Add-on,   AD     12/17/18 2130   12/17/18 2131  TSH  Add-on,   AD     12/17/18 2130   12/17/18 2131  Vitamin B12  Add-on,   AD     12/17/18 2130   12/17/18 2131  Folate RBC  Add-on,   AD     12/17/18 2130   12/17/18 2131  Hepatic function panel  Add-on,   AD     12/17/18 2130          Vitals/Pain Today's Vitals   12/18/18 0345 12/18/18 0351 12/18/18 0420 12/18/18 0443  BP: 91/62  98/76 98/76  Pulse: 91  91 91  Resp: (!) 22  17   Temp:  TempSrc:      SpO2: 100%  98%   Weight:      Height:      PainSc:  0-No pain 0-No pain     Isolation Precautions No active isolations  Medications Medications  aspirin EC tablet 81 mg (has no administration in time range)  cephALEXin (KEFLEX) capsule 500 mg (500 mg Oral Given 12/18/18 0115)  atorvastatin (LIPITOR) tablet 80 mg (has no administration in time range)  digoxin (LANOXIN) tablet 0.125 mg (has no administration in time range)  metoprolol succinate (TOPROL-XL) 24 hr  tablet 50 mg (has no administration in time range)  venlafaxine (EFFEXOR) tablet 75 mg (has no administration in time range)  sodium chloride flush (NS) 0.9 % injection 3 mL (3 mLs Intravenous Not Given 12/18/18 0106)  sodium chloride flush (NS) 0.9 % injection 3 mL (has no administration in time range)  0.9 %  sodium chloride infusion (has no administration in time range)  acetaminophen (TYLENOL) tablet 650 mg (has no administration in time range)  ondansetron (ZOFRAN) injection 4 mg (has no administration in time range)  heparin injection 5,000 Units (5,000 Units Subcutaneous Given 12/18/18 0235)  LORazepam (ATIVAN) tablet 1-4 mg (has no administration in time range)    Or  LORazepam (ATIVAN) injection 1-4 mg (has no administration in time range)  thiamine (VITAMIN B-1) tablet 100 mg (has no administration in time range)    Or  thiamine (B-1) injection 100 mg (has no administration in time range)  folic acid (FOLVITE) tablet 1 mg (has no administration in time range)  multivitamin with minerals tablet 1 tablet (has no administration in time range)  sodium chloride flush (NS) 0.9 % injection 3 mL (3 mLs Intravenous Not Given 12/18/18 0050)  sodium chloride flush (NS) 0.9 % injection 3 mL (has no administration in time range)  0.9 %  sodium chloride infusion (has no administration in time range)  LORazepam (ATIVAN) tablet 0-4 mg (0 mg Oral Not Given 12/18/18 0447)    Followed by  LORazepam (ATIVAN) tablet 0-4 mg (has no administration in time range)  ticagrelor (BRILINTA) tablet 90 mg (90 mg Oral Not Given 12/18/18 0206)  sodium chloride 0.9 % bolus 250 mL (0 mLs Intravenous Stopped 12/17/18 2318)  furosemide (LASIX) injection 40 mg (40 mg Intravenous Given 12/18/18 0118)    Mobility walks with device High fall risk   Focused Assessments -   R Recommendations: See Admitting Provider Note  Report given to:   Additional Notes: -

## 2018-12-18 NOTE — [place] Notes (Signed)
PT Cancellation Note  Patient Details Name: Leonard [name] MRN: [id number] DOB: 01-29-1945   Cancelled Treatment:    Reason Eval/Treat Not Completed: Patient at procedure or test/unavailable  Patient with admissions' nurse. Will see ASAP as noted an imminent discharge order for PT.    Barry Brunner, PT Pager [phone]   Rexanne Mano 12/18/2018, 9:44 AM

## 2018-12-18 NOTE — TOC Initial Note (Addendum)
Transition of Care [facility]) - Initial/Assessment Note    Patient Details  Name: Leonard [name] MRN: [id number] Date of Birth: 16-Sep-1945  [facility] CM/SW Contact:    Carles Collet, RN Phone Number: 12/18/2018, 7:46 AM  Clinical Narrative:             Patient from home w wife. Increased weakness, unable to get up out of WC he normally uses at home. He is noted to have pressure wound this admission and cellulitis. He follows at [facility] and has not been going to cardiac rehab due to fears of getting COVID. He would benefit from [facility] services and [facility] follow up after DC. [facility] consult order has been placed.  PT eval pending.  PCP   Girtha Rm, NP-C Coverage Medicare /THN   Expected Discharge Plan: [place] Barriers to Discharge: Continued Medical Work up   Patient Goals and CMS Choice        Expected Discharge Plan and Services Expected Discharge Plan: [employer]                                              Prior Living Arrangements/Services   Lives with:: Spouse                   Activities of Daily Living      Permission Sought/Granted                  Emotional Assessment              Admission diagnosis:  Excoriation [T14.8XXA] Weakness [R53.1] Hypotension, unspecified hypotension type [I95.9] Patient Active Problem List   Diagnosis Date Noted  . Pressure injury of skin 12/18/2018  . Generalized weakness 12/17/2018  . Excessive drinking of alcohol 12/17/2018  . Lower extremity cellulitis 12/17/2018  . Depression 06/14/2018  . Need for shingles vaccine 06/14/2018  . AKI (acute kidney injury) ([place]) 06/11/2016  . S/P implantation of automatic cardioverter/defibrillator (AICD)   . Grief 02/27/2016  . Ischemic cardiomyopathy   . Morbid obesity ([place])   . Chronic combined systolic and diastolic CHF (congestive heart failure) ([place])   . Sustained ventricular  fibrillation ([place]) 08/20/2015  . CKD (chronic kidney disease), stage III 08/14/2015  . Elevated glucose 08/14/2015  . ST elevation myocardial infarction (STEMI) of true posterior wall, subsequent episode of care ([place]) 08/12/2015  . Coronary artery disease involving native coronary artery with angina pectoris ([place]) 08/12/2015  . Essential hypertension 02/12/2015  . Hyperlipidemia LDL goal <70 02/12/2015  . Erectile dysfunction 02/12/2015  . BPH (benign prostatic hyperplasia) 02/12/2015   PCP:  Girtha Rm, NP-C Pharmacy:   [facility] [place], [place] - [address] AT [place] [address] [place] [address] Phone: [phone] Fax: [phone]  [facility] [place], [place] [address] Phone: [phone] Fax: [phone]     Social Determinants of Health (SDOH) Interventions    Readmission Risk Interventions No flowsheet data found.

## 2018-12-18 NOTE — Progress Notes (Signed)
PROGRESS NOTE  Leonard [name] WUJ:[id number] DOB: 11/26/1945 DOA: 12/17/2018 PCP: Girtha Rm, NP-C  HPI/Recap of past 24 hours: HPI from Dr Hilary Hertz [name] is a 73 y.o. male with medical history significant for coronary artery disease, ischemic cardiomyopathy with EF 15 to 20%, chronic kidney disease stage [name], and excessive daily alcohol use, now presenting to the ED for evaluation of generalized weakness/unable to ambulate. Pt was seen in cardiology clinic on 12/05/18, appeared hypervolemic, aldactone was increased and Lasix switched to torsemide. Pt denied any shortness of breath, but reports significant serous weeping from his edematous lower extremities, which was suspected to have a superimposed cellulitis, and he was started on Keflex earlier this month.  In the ED, patient afebrile, systolic BP in the 95A, head CT is negative for acute intracranial hemorrhage. Chest x-ray is notable for hazy interstitial opacities consistent with interstitial edema.  Labs showed mild hyponatremia, mild AKI with creatinine up to 1.82 from 1.67 earlier this month, CBC with chronic leukocytosis, COVID-19 screening negative.  Patient admitted for further management   Today, patient denies any worsening shortness of breath, chest pain, abdominal pain, nausea/vomiting, fever/chills.  Noted significant bilateral lower extremity edema with bloody weeping, with multiple excoriations.  Patient reports he plays with his wife's cat who scratches him.    Assessment/Plan: Principal Problem:   Generalized weakness Active Problems:   Essential hypertension   CKD (chronic kidney disease), stage [name]   Ischemic cardiomyopathy   Chronic combined systolic and diastolic CHF (congestive heart failure) (HCC)   Coronary artery disease involving native coronary artery with angina pectoris (HCC)   Excessive drinking of alcohol   Lower extremity cellulitis   Pressure injury of skin  Acute on chronic  combined systolic/diastolic HF s/p AICD Significant bilateral lower extremity edema BNP pending Chest x-ray with hazy interstitial opacities consistent with interstitial edema Echo done on 01/2017 showed EF of 15 to 21%, grade 2 diastolic dysfunction Continue IV Lasix, digoxin, Cardiology/HF consulted Strict I's and O's, daily weight Telemetry monitoring  Generalized weakness/deconditioned Likely due to above Used to go for cardiac rehab for have not been since the Covid pandemic CK level, B12, TSH, all within normal limits CT head negative for acute findings PT/OT  Bilateral lower extremity cellulitis Currently afebrile, chronic leukocytosis Multiple excoriations with serous weeping noted Continue p.o. Keflex  Mild AKI on CKD stage [name] New baseline creatinine since this year around 1.67 Monitor closely with diuresis  Hyperkalemia Mild Give Lokelma  Hypotension Patient's BP normally runs on the low side Monitor closely  Elevated TSH TSH 9, free T4 1.09 Outpatient follow-up  CAD Currently chest pain-free Continue ASA, Brilinta, statin, beta-blocker  Alcohol abuse Patient has 5-7 drinks every night for many years Patient counseled, advised to quit Continue CIWA protocol            Malnutrition Type:      Malnutrition Characteristics:      Nutrition Interventions:       Estimated body mass index is 37.56 kg/m as calculated from the following:   Height as of this encounter: 6' 1.5" (1.867 m).   Weight as of this encounter: 130.9 kg.     Code Status: Full  Family Communication: None at bedside  Disposition Plan: CIR   Consultants:  Cardiology  Procedures:  None  Antimicrobials:  Keflex  DVT prophylaxis: Heparin [place]   Objective: Vitals:   12/18/18 0748 12/18/18 1106 12/18/18 1109 12/18/18 1112  BP: 93/65 (!) 73/48 [name])  78/54 109/85  Pulse: (!) 103 92 93 95  Resp: 18 19    Temp: 97.6 F (36.4 C) 98 F (36.7 C)     TempSrc: Oral Oral    SpO2: 100% 100% 100%   Weight:      Height:        Intake/Output Summary (Last 24 hours) at 12/18/2018 1240 Last data filed at 12/18/2018 0908 Gross per 24 hour  Intake 240 ml  Output 445 ml  Net -205 ml   Filed Weights   12/17/18 1817 12/18/18 0651  Weight: 132 kg 130.9 kg    Exam:  General: NAD, conically ill-appearing  Cardiovascular: S1, S2 present  Respiratory:  Diminished breath sounds bilaterally  Abdomen: Soft, nontender, nondistended, bowel sounds present  Musculoskeletal: 2+ bilateral pedal edema noted  Skin: Multiple excoriations with serous weeping noted bilaterally  Psychiatry: Normal mood    Data Reviewed: CBC: Recent Labs  Lab 12/17/18 1822 12/18/18 0456  WBC 14.8* 14.8*  HGB 16.5 15.6  HCT 51.2 49.8  MCV 89.5 90.7  PLT 334 956   Basic Metabolic Panel: Recent Labs  Lab 12/13/18 0938 12/17/18 1822 12/18/18 0456  NA 134 133* 133*  K 4.6 5.3* 5.2*  CL 91* 91* 90*  CO2 25 26 26   GLUCOSE 156* 141* 103*  BUN 38* 41* 42*  CREATININE 1.67* 1.82* 1.86*  CALCIUM 9.3 9.5 9.5  MG  --   --  2.3  PHOS  --   --  4.6   GFR: Estimated Creatinine Clearance: 49 mL/min (A) (by C-G formula based on SCr of 1.86 mg/dL (H)). Liver Function Tests: Recent Labs  Lab 12/18/18 0444  AST 22  ALT 13  ALKPHOS 111  BILITOT 2.0*  PROT 6.4*  ALBUMIN 3.1*   No results for input(s): LIPASE, AMYLASE in the last 168 hours. No results for input(s): AMMONIA in the last 168 hours. Coagulation Profile: No results for input(s): INR, PROTIME in the last 168 hours. Cardiac Enzymes: Recent Labs  Lab 12/18/18 0444  CKTOTAL 119   BNP (last 3 results) No results for input(s): PROBNP in the last 8760 hours. HbA1C: No results for input(s): HGBA1C in the last 72 hours. CBG: No results for input(s): GLUCAP in the last 168 hours. Lipid Profile: No results for input(s): CHOL, HDL, LDLCALC, TRIG, CHOLHDL, LDLDIRECT in the last 72 hours.  Thyroid Function Tests: Recent Labs    12/18/18 0445 12/18/18 0536  TSH 9.093*  --   FREET4  --  1.09   Anemia Panel: Recent Labs    12/18/18 0444  VITAMINB12 580   Urine analysis:    Component Value Date/Time   COLORURINE YELLOW 12/17/2018 2021   APPEARANCEUR CLEAR 12/17/2018 2021   LABSPEC 1.013 12/17/2018 2021   PHURINE 6.0 12/17/2018 2021   GLUCOSEU NEGATIVE 12/17/2018 2021   HGBUR SMALL (A) 12/17/2018 2021   BILIRUBINUR NEGATIVE 12/17/2018 2021   BILIRUBINUR n 04/18/2015 1106   [place] 12/17/2018 2021   PROTEINUR NEGATIVE 12/17/2018 2021   UROBILINOGEN negative 04/18/2015 1106   NITRITE NEGATIVE 12/17/2018 2021   LEUKOCYTESUR NEGATIVE 12/17/2018 2021   Sepsis Labs: @LABRCNTIP (procalcitonin:4,lacticidven:4)  ) Recent Results (from the past 240 hour(s))  SARS CORONAVIRUS 2 (TAT 6-24 HRS) Nasopharyngeal Nasopharyngeal Swab     Status: None   Collection Time: 12/17/18  9:40 PM   Specimen: Nasopharyngeal Swab  Result Value Ref Range Status   SARS Coronavirus 2 NEGATIVE NEGATIVE Final    Comment: (NOTE) SARS-CoV-2 target nucleic acids are  NOT DETECTED. The SARS-CoV-2 RNA is generally detectable in upper and lower respiratory specimens during the acute phase of infection. Negative results do not preclude SARS-CoV-2 infection, do not rule out co-infections with other pathogens, and should not be used as the sole basis for treatment or other patient management decisions. Negative results must be combined with clinical observations, patient history, and epidemiological information. The expected result is Negative. Fact Sheet for Patients: SugarRoll.be Fact Sheet for Healthcare Providers: https://www.woods-mathews.com/ This test is not yet approved or cleared by the [place] FDA and  has been authorized for detection and/or diagnosis of SARS-CoV-2 by FDA under an Emergency Use Authorization (EUA). This EUA  will remain  in effect (meaning this test can be used) for the duration of the COVID-19 declaration under Section 56 4(b)(1) of the Act, 21 U.S.C. section 360bbb-3(b)(1), unless the authorization is terminated or revoked sooner. Performed at [facility], [address], [place], [place] [address]   MRSA PCR Screening     Status: None   Collection Time: 12/18/18  9:07 AM   Specimen: Nasal Mucosa; Nasopharyngeal  Result Value Ref Range Status   MRSA by PCR NEGATIVE NEGATIVE Final    Comment:        The GeneXpert MRSA Assay (FDA approved for NASAL specimens only), is one component of a comprehensive MRSA colonization surveillance program. It is not intended to diagnose MRSA infection nor to guide or monitor treatment for MRSA infections. Performed at [facility], [place] [address] [place], [place] [address]       Studies: Dg Chest 2 View  Result Date: 12/17/2018 CLINICAL DATA:  Weakness EXAM: CHEST - 2 VIEW COMPARISON:  Radiograph Jun 09, 2016 FINDINGS: Hazy interstitial opacities cardiomegaly with cephalized vascularity and central cuffing. No pneumothorax. No visible effusion. AICD battery pack overlies the left chest wall with leads at the cardiac apex. The aorta is calcified. The remaining cardiomediastinal contours are unremarkable. No acute osseous or soft tissue abnormality. IMPRESSION: 1. Hazy interstitial opacities and central cuffing, consistent with interstitial edema. 2. Cardiomegaly.  No pleural effusion. Electronically Signed   By: Lovena Le M.D.   On: 12/17/2018 20:54   Ct Head Wo Contrast  Result Date: 12/17/2018 CLINICAL DATA:  73 year old male with altered mental status. EXAM: CT HEAD WITHOUT CONTRAST TECHNIQUE: Contiguous axial images were obtained from the base of the skull through the vertex without intravenous contrast. COMPARISON:  None. FINDINGS: Brain: There is mild age-related atrophy and chronic microvascular ischemic changes. There is no  acute intracranial hemorrhage. No mass effect or midline shift. No extra-axial fluid collection. Vascular: No hyperdense vessel or unexpected calcification. Skull: Normal. Negative for fracture or focal lesion. Sinuses/Orbits: Partial opacification of several ethmoid air cells. No air-fluid level. The mastoid air cells are clear. Other: None IMPRESSION: 1. No acute intracranial hemorrhage. 2. Mild age-related atrophy and chronic microvascular ischemic changes. Electronically Signed   By: Anner Crete M.D.   On: 12/17/2018 20:43    Scheduled Meds: . aspirin EC  81 mg Oral Daily  . atorvastatin  80 mg Oral q1800  . digoxin  0.125 mg Oral q1800  . folic acid  1 mg Oral Daily  . heparin  5,000 Units Subcutaneous Q8H  . LORazepam  0-4 mg Oral Q6H   Followed by  . [START ON 12/19/2018] LORazepam  0-4 mg Oral Q12H  . metoprolol succinate  50 mg Oral Daily  . multivitamin with minerals  1 tablet Oral Daily  .  sodium chloride flush  3 mL Intravenous Q12H  . sodium chloride flush  3 mL Intravenous Q12H  . sodium zirconium cyclosilicate  10 g Oral BID  . thiamine  100 mg Oral Daily   Or  . thiamine  100 mg Intravenous Daily  . ticagrelor  90 mg Oral BID  . venlafaxine  75 mg Oral Daily    Continuous Infusions: . sodium chloride    . sodium chloride       LOS: 0 days     Alma Friendly, MD [facility]  If 7PM-7AM, please contact night-coverage [email or web address] 12/18/2018, 12:40 PM

## 2018-12-18 NOTE — Progress Notes (Signed)
Orthopedic Tech Progress Note Patient Details:  Leonard [name] [birth year] [id number]  Ortho Devices Type of Ortho Device: Louretta Parma boot Ortho Device/Splint Location: Bilateral unna boots Ortho Device/Splint Interventions: Application   Post Interventions Patient Tolerated: Well Instructions Provided: Care of device   Leonard [name] 12/18/2018, 6:07 PM

## 2018-12-18 NOTE — Consult Note (Addendum)
Cardiology Consultation:   Patient ID: Leonard [name] [id number]; Mar 09, 1945   Admit date: 12/17/2018 Date of Consult: 12/18/2018  Primary Care Provider: Girtha Rm, NP-C Primary Cardiologist: Glenetta Hew, MD 03/07/2017 Primary Electrophysiologist:  Virl Axe, MD 03/11/2017 R. Charlcie Cradle, [facility] 12/05/2018   Patient Profile:   Colton Tassin [name] is a 73 y.o. male with a hx of STEMI 2017 s/p DES LAD, non-obs dz at cath 2018, ICM w/ EF 15-20% & grade 2 dd at 2019 echo s/p STJ ICD, NSVT w/out shocks, S-D-CHF, CKD [name], VF 2017, HTN, HLD, LBBB, ETOH use, who is being seen today for the evaluation of CHF at the request of Dr Horris Latino.  History of Present Illness:   [name] was seen in the office by EP on 12/05/2018. He was markedly volume overloaded, but refused hospital admission. He was previously on Lasix 40 mg qd, was changed to torsemide 40 mg qd, increase to 80 mg qd>>80 mg bid if inadequate response. Aldactone increased to 25 mg qd, was put on Keflex 500 mg bid for LE wounds/erythema.  He was too weak to stand on the scales.  His weight was reported at 291 pounds, his wife is not sure that was accurate.  In 11/23 phone notes, patient was urinating frequently on torsemide 40 mg a day but minimal improvement in lower extremity edema.  He was not weighing himself daily.  He was not keeping track of his urine output.  The wife was requesting a home health nurse. 11/25, Dr. Caryl Comes increased the torsemide to 40 mg twice daily every other day, appointment on 12/1 with lab work made  Patient came to the ER on 11/28 complaining of general weakness and difficulty with ambulation.  He was reporting that he was taking torsemide 20 mg alternating with 40 mg/day.  He reported problems with walking over the last 3 days.  Wife states he was urinating less than usual.  He was hypotensive in the ER with her in the 24s.  He received a 250 cc fluid bolus.  Potassium on admission was 5.3 with  a creatinine of 1.82. He got Lasix 40 mg IV at 1 AM.  He got Toprol-XL 50 mg at 6:26 AM, systolic blood pressure was in the 70s at 11 AM..  Mr. Leonard Cranker feels that his volume and respiratory status are not much different.  He denies shortness of breath.  He denies chest pain.  He states that he walks from one end of the house to the other several times a day.  He does not feel his lower extremity edema or his legs are worse than usual.  He says that his legs have been sweating, denies that they are weeping.  He denies orthopnea or PND, but sleeps with the head of the bed elevated chronically.  He states he does that because his wife likes it.  He denies weakness at this time, but says he liked walking with a walker better than walking with a cane.  He expresses frustration with the decline in his physical condition and mentions that he used to be quite athletic, playing on multiple sports teams.  He admits that his medical condition depresses him, and he does not feel the Effexor is doing the job.   Past Medical History:  Diagnosis Date  . AICD (automatic cardioverter/defibrillator) present 08/26/2015  . Anxiety   . Atherosclerotic heart disease of native coronary artery with angina pectoris ([place]) 07/2015   a. 07/2015 Posterior STEMI/PCI: LM nl, LAd  40ost, RI 40, RCA 177m (3.0x22 Resolute Integrity DES distal, 3.0x12 Resolute Integrity DES prox), 90d (PTCA), RPDA small, nl, RPLB2 100 - too small for PTCA. -- patent in f/u cath 08/2015 & 05/2016 - patent RCA stents (~10%), RI 40%, LM ~30% ost LAD ~40%.  . Chronic combined systolic and diastolic CHF (congestive heart failure) ([place]) 07/2015   a. 07/2015 Ehco: EF 45-50%, basal-mid inf and infsept AK, inflat, apical inf, and apical septal HK, Gr1 DD, triv MR.;; b - Echo 05/2016 -> EF 20-25%, Gr 2 DD.  Severe diffuse HK disproportionately severe HK and thinning of apex, anterior septum and anterior wall-with septal dyssynchrony.  PAP ~53 mmHg.  [name]  Hyperlipidemia with target low density lipoprotein (LDL) cholesterol less than 70 mg/dL   . Hypertensive heart disease   . Ischemic cardiomyopathy 07/2015 - 05/2016   a. 07/2015 Echo: EF 45-50%.; Follow-up Echo 08/23/2015: EF 50-55% with mild inferior hypokinesis --> 05/2016: EF 20-25%, diffuse HK. PAP ~53 mmHg.  . Kidney disease, chronic, stage [name] (GFR 30-59 ml/min) 08/12/2015  . Morbid obesity ([place])   . ST elevation myocardial infarction (STEMI) of inferior wall ([place]) 08/12/2015   Subacute presentation for inferior MI with post infarction angina on the following day. EKG still shows injury current. Therefore decided to proceed with intervention of 100% mRCA thrombotic lesion.  . Sustained ventricular fibrillation ([place]) 08/22/2015   Underwent EP evaluation with final ICD implantation.    Past Surgical History:  Procedure Laterality Date  . CARDIAC CATHETERIZATION N/A 08/12/2015   Procedure: Left Heart Cath and Coronary Angiography;  Surgeon: Leonie Man, MD;  Location: [place] [facility];  Service: Cardiovascular: Inferior STEMI: 100% mid RCA, 90% distal RCA, 100% RPL. 40% ostial and proximal LAD and branch of ramus.  [name] CARDIAC CATHETERIZATION N/A 08/26/2015   Procedure: Left Heart Cath and Coronary Angiography;  Surgeon: Belva Crome, MD;  Location: [place] [facility];  Service: Cardiovascular: To evaluate sustained VT. Widely patent RCA stent and distal PTCA site. Also patent RPL branch. LAD lesion appeared to be more consistent with 50-55% and 40%. Otherwise stable from previous cath. EF estimated 35-45%.  [name] CARDIOVERSION N/A 08/23/2015   Procedure: CARDIOVERSION;  Surgeon: Deboraha Sprang, MD;  Location: [facility];  Service: Cardiovascular;  Laterality: N/A;  . CORONARY ANGIOPLASTY WITH STENT PLACEMENT  08/12/2015   Mid RCA 100% reduced to 0% with 2 overlapping Resolute DES.  3.0 x 22 mm with a 3.0 x 12 mm prox overlarp (postdilated to 3.4 mm); PTCA of dRCA 90%. (Very dificult,complex case -  tortuous, [place] RCA - unable to advance longer stents.  RPL2 noted to have thromboembolic 637% occlusion - unable to reach.  . ELECTROPHYSIOLOGIC STUDY N/A 08/26/2015   Procedure: Electrophysiology Study;  Surgeon: Deboraha Sprang, MD;  Location: [place] [facility];  Service: Cardiovascular;  Laterality: N/A;  . EP IMPLANTABLE DEVICE N/A 08/26/2015   Procedure: ICD Implant;  Surgeon: Deboraha Sprang, MD;  Location: [place] [facility];  Service: Cardiovascular;  Laterality: LEFT:  St Jude ICD, serial number [id number].   [name] HEMORRHOID SURGERY    . RIGHT/LEFT HEART CATH AND CORONARY ANGIOGRAPHY N/A 06/10/2016   Procedure: Right/Left Heart Cath and Coronary Angiography;  Surgeon: Martinique, Peter M, MD;  [facility] INVASIVE [facility] LAB:  Nonobstructive CAD. RCA stents PATENT.  Ost-Prox RCA, 10 %. Ost LM 30 %. Ost LAD 40 %. RI, 40 %; High PCWP  & LVEDP (23 mmHg) ; PAP 43/23/31  mmHg. CO/CI 3.89 / 1/5 (Severely Reduced)  . SKIN SURGERY    . TRANSTHORACIC ECHOCARDIOGRAM  08/13/2015; 08/23/2015   a. Mild concentric LVH. EF 45-50%. Basal-mid inferior and inferoseptal akinesis with hypokinesis of inferolateral and apical inferior wall. 1 DD. ;; b. Technically difficult study. Definity contrast administered.  Compared to a prior echo in 07/2015, the LVEF is slightly higher at 50-55% with mild inferior hypokinesis.  . TRANSTHORACIC ECHOCARDIOGRAM  06/09/2016   EF  20-25%, GR 2 DD. Severe diffuse  hypokinesis with distinct regional wall motion abnormalities.   There is disproportionately severe hypokinesis and thinning of the apex, anterior septum and anterior wall (septal dyssynchrony). However, there is severe dyssynchrony , making it difficult to assess regional function.  PA P ~53 mmHg  . TRANSTHORACIC ECHOCARDIOGRAM  01/2017   Severely reduced EF.  GI to DD.  Moderate MR.  Mild to have a moderately elevated PAP (peak 44mmHg)     Prior to Admission medications   Medication Sig Start Date End Date Taking? Authorizing  Provider  acetaminophen (TYLENOL) 500 MG tablet Take 500-1,000 mg by mouth every 6 (six) hours as needed for mild pain or headache.   Yes [provider]  aspirin EC 81 MG tablet Take 1 tablet (81 mg total) by mouth daily. 08/14/15  Yes Arbutus Leas, NP  atorvastatin (LIPITOR) 80 MG tablet TAKE 1 TABLET BY MOUTH DAILY AT 6 PM; [place] REFILLS Patient taking differently: Take 80 mg by mouth daily at 6 PM.  12/12/18  Yes Deboraha Sprang, MD  BRILINTA 90 MG TABS tablet TAKE 1 TABLET BY MOUTH TWICE DAILY(NEEDS OFFICE VISIT FOR REFILLS) Patient taking differently: Take 90 mg by mouth 2 (two) times daily.  10/25/18  Yes Deboraha Sprang, MD  cephALEXin (KEFLEX) 500 MG capsule Take 1 capsule (500 mg total) by mouth 2 (two) times daily. 12/05/18  Yes Baldwin Jamaica, PA-C  Coenzyme Q10 (COQ10) 200 MG CAPS Take 200 mg by mouth daily.   Yes [provider]  digoxin (LANOXIN) 0.125 MG tablet TAKE 1 TABLET BY MOUTH DAILY Patient taking differently: Take 0.125 mg by mouth daily at 6 PM.  11/22/18  Yes Leonie Man, MD  MAGNESIUM-OXIDE 400 (241.3 Mg) MG tablet TAKE 1 TABLET BY MOUTH TWICE DAILY Patient taking differently: Take 400 mg by mouth 2 (two) times daily.  11/09/18  Yes Leonie Man, MD  metoprolol succinate (TOPROL-XL) 50 MG 24 hr tablet TAKE 1 TABLET BY MOUTH DAILY Patient taking differently: Take 50 mg by mouth daily.  10/24/18  Yes Leonie Man, MD  Multiple Vitamin (MULTIVITAMIN) tablet Take 1 tablet by mouth daily.   Yes [provider]  nitroGLYCERIN (NITROSTAT) 0.4 MG SL tablet Place 1 tablet (0.4 mg total) under the tongue every 5 (five) minutes as needed for chest pain. 08/14/15  Yes Arbutus Leas, NP  Omega-3 Fatty Acids (FISH OIL) 1200 MG CPDR Take 1,200 mg by mouth daily.    Yes [provider]  sacubitril-valsartan (ENTRESTO) 49-51 MG Take 1 tablet by mouth 2 (two) times daily. 12/12/18  Yes Deboraha Sprang, MD  spironolactone (ALDACTONE) 25 MG tablet Take 1 tablet (25 mg total) by mouth daily. Patient taking differently: Take 12.5 mg by mouth daily.  12/05/18  Yes Baldwin Jamaica, PA-C  torsemide (DEMADEX) 20 MG tablet Take 2 tablets (40 mg total) by mouth 2 (two) times daily. every other day Patient taking  differently: Take 80 mg by mouth every other day.  12/14/18  Yes Baldwin Jamaica, PA-C  venlafaxine (EFFEXOR) 75 MG tablet TAKE 1 TABLET BY MOUTH EVERY DAY Patient taking differently: Take 75 mg by mouth daily.  11/22/18  Yes Henson, Vickie L, NP-C  Cholecalciferol (VITAMIN D) 2000 units CAPS Take 1 Units by mouth daily.     [provider]  Investigational - Study Medication Take 1 tablet by mouth 2 (two) times daily. Study name: Galactic HF Study Additional study details: Omecamtiv Mecarbil or Placebo Patient not taking: Reported on 12/17/2018 06/25/16   Larey Dresser, MD    Inpatient Medications: Scheduled Meds: . aspirin EC  81 mg Oral Daily  . atorvastatin  80 mg Oral q1800  . digoxin  0.125 mg Oral q1800  . folic acid  1 mg Oral Daily  . heparin  5,000 Units Subcutaneous Q8H  . LORazepam  0-4 mg Oral Q6H   Followed by  . [START ON 12/19/2018] LORazepam  0-4 mg Oral Q12H  . metoprolol succinate  50 mg Oral Daily  . multivitamin with minerals  1 tablet Oral Daily  . sodium chloride flush  3 mL Intravenous Q12H  . sodium chloride flush  3 mL Intravenous Q12H  . sodium zirconium cyclosilicate  10 g Oral BID  . thiamine  100 mg Oral Daily   Or  . thiamine  100 mg Intravenous Daily  . ticagrelor  90 mg Oral BID  . venlafaxine  75 mg Oral Daily   Continuous Infusions: . sodium chloride    . sodium chloride     PRN Meds: sodium chloride, sodium chloride, acetaminophen, LORazepam **OR** LORazepam, ondansetron (ZOFRAN) IV, sodium chloride flush, sodium chloride flush  Allergies:    Allergies  Allergen Reactions  . Clams [Shellfish Allergy] Shortness Of  Breath, Itching and Swelling  . Shellfish-Derived Products Shortness Of Breath, Itching and Swelling  . Other Itching and Other (See Comments)    Ragweed causes watery eyes, itching, sneezing  . Sulfa Antibiotics Other (See Comments)    Pt unsure of reaction, mother told him he was allergic     Social History:   Social History   Socioeconomic History  . Marital status: Married    Spouse name: Not on file  . Number of children: Not on file  . Years of education: Not on file  . Highest education level: Not on file  Occupational History  . Not on file  Social Needs  . Financial resource strain: Not on file  . Food insecurity    Worry: Not on file    Inability: Not on file  . Transportation needs    Medical: Not on file    Non-medical: Not on file  Tobacco Use  . Smoking status: Never Smoker  . Smokeless tobacco: Never Used  Substance and Sexual Activity  . Alcohol use: Yes    Alcohol/week: 3.0 - 4.0 standard drinks    Types: 3 - 4 Shots of liquor per week    Comment: 3-4 glasses of scotch 3-4 days per week  . Drug use: No  . Sexual activity: Not on file  Lifestyle  . Physical activity    Days per week: Not on file    Minutes per session: Not on file  . Stress: Not on file  Relationships  . Social Herbalist on phone: Not on file    Gets together: Not on file    Attends religious service:  Not on file    Active member of club or organization: Not on file    Attends meetings of clubs or organizations: Not on file    Relationship status: Not on file  . Intimate partner violence    Fear of current or ex partner: Not on file    Emotionally abused: Not on file    Physically abused: Not on file    Forced sexual activity: Not on file  Other Topics Concern  . Not on file  Social History Narrative   He tells me that his daughter died on Halloween 05-08-15. She had a diving accident at the age of 51 and been quadriplegic for 25 years. She had been incredibly  vigorous girl. He showed me her high school graduation picture.She was as he reported beautiful.  Her name was [name].    Family History:   Family History  Problem Relation Age of Onset  . Diabetes Mother   . Heart disease Paternal Grandmother   . Heart disease Paternal Grandfather    Family Status:  Family Status  Relation Name Status  . Mother  Deceased  . Father  Deceased at age 40  . PGM  Deceased       died at a relatively young age from heart disease  . PGF  Deceased       died at a relatively young age, from heart disease  . MGM  Deceased  . MGF  Deceased    ROS:  Please see the history of present illness.  All other ROS reviewed and negative.     Physical Exam/Data:   Vitals:   12/18/18 0748 12/18/18 1106 12/18/18 1109 12/18/18 1112  BP: 93/65 (!) 73/48 (!) 78/54 109/85  Pulse: (!) 103 92 93 95  Resp: 18 19    Temp: 97.6 F (36.4 C) 98 F (36.7 C)    TempSrc: Oral Oral    SpO2: 100% 100% 100%   Weight:      Height:        Intake/Output Summary (Last 24 hours) at 12/18/2018 1137 Last data filed at 12/18/2018 0908 Gross per 24 hour  Intake 240 ml  Output 445 ml  Net -205 ml   Filed Weights   12/17/18 1817 12/18/18 0651  Weight: 132 kg 130.9 kg   Body mass index is 37.56 kg/m.  General:  Well nourished, well developed, male in no acute distress HEENT: normal Lymph: no adenopathy Neck: JVD - 9-10 cm,  Endocrine:  No thryomegaly Vascular: No carotid bruits; upper extremity pulses 2+, lower extremity pulses difficult to palpate due to fluid Cardiac:  normal S1, S2; RRR; no murmur Lungs: Decreased breath sounds bases bilaterally, no wheezing, rhonchi or rales  Abd: soft, nontender, no hepatomegaly  Ext: 2+ lower extremity edema Musculoskeletal:  No deformities, BUE and BLE strength weak but equal Skin: warm and dry; significant erythema of both lower extremities with multiple areas of weeping, currently dry Neuro:  CNs 2-12 intact, no focal  abnormalities noted Psych:  Normal affect   EKG:  The EKG was personally reviewed and demonstrates: 11/28 ECG is sinus tachycardia, heart rate 111, LBBB with QRS duration 178 ms which is unchanged from 12/05/2018 Telemetry:  Telemetry was personally reviewed and demonstrates: Sinus rhythm with occasional PVCs   CV studies:   ECHO: 02/04/2017 - Left ventricle: The cavity size was severely dilated. Systolic   function was severely reduced. The estimated ejection fraction   was in the range of 15%  to 20%. Diffuse hypokinesis. Features are   consistent with a pseudonormal left ventricular filling pattern,   with concomitant abnormal relaxation and increased filling   pressure (grade 2 diastolic dysfunction). There was moderate   spontaneous echo contrast, indicative of stasis. - Mitral valve: There was mild to moderate regurgitation. - Left atrium: The atrium was mildly dilated. - Pulmonary arteries: Systolic pressure was mildly to moderately   increased. PA peak pressure: 39 mm Hg (S).  CATH: 06/10/2016 Fick Cardiac Output 3.89 L/min  Fick Cardiac Output Index 1.54 (L/min)/BSA  RA A Wave 17 mmHg  RA V Wave 17 mmHg  RA Mean 16 mmHg  RV Systolic Pressure 42 mmHg  RV Diastolic Pressure 11 mmHg  RV EDP 21 mmHg  PA Systolic Pressure 43 mmHg  PA Diastolic Pressure 23 mmHg  PA Mean 31 mmHg  PW A Wave 25 mmHg  PW V Wave 26 mmHg  PW Mean 23 mmHg  AO Systolic Pressure 161 mmHg  AO Diastolic Pressure 72 mmHg  AO Mean 87 mmHg  LV Systolic Pressure 096 mmHg  LV Diastolic Pressure 15 mmHg  LV EDP 23 mmHg  AOp Systolic Pressure 045 mmHg  AOp Diastolic Pressure 74 mmHg  AOp Mean Pressure 88 mmHg  LVp Systolic Pressure 409 mmHg  LVp Diastolic Pressure 15 mmHg  LVp EDP Pressure 28 mmHg  QP/QS 1  TPVR Index 20.15 HRUI  TSVR Index 56.54 HRUI  PVR SVR Ratio 0.11  TPVR/TSVR Ratio 0.36   Ramus lesion, 40 %stenosed.  Ost RCA to Prox RCA lesion, 10 %stenosed.  Mid RCA lesion, 0  %stenosed.  Dist RCA lesion, 10 %stenosed.  Ost LM to LM lesion, 30 %stenosed.  Ost LAD lesion, 40 %stenosed.  LV end diastolic pressure is moderately elevated.  Hemodynamic findings consistent with mild pulmonary hypertension.   1. Nonobstructive CAD. Continued patency of stents in the RCA 2. Elevated LVEDP 3. Mild pulmonary HTN. 4. Reduced cardiac output. 3.89 L/min with index of 1.5.  Laboratory Data:   Chemistry Recent Labs  Lab 12/13/18 0938 12/17/18 1822 12/18/18 0456  NA 134 133* 133*  K 4.6 5.3* 5.2*  CL 91* 91* 90*  CO2 25 26 26   GLUCOSE 156* 141* 103*  BUN 38* 41* 42*  CREATININE 1.67* 1.82* 1.86*  CALCIUM 9.3 9.5 9.5  GFRNONAA 39* 36* 35*  GFRAA 46* 41* 40*  ANIONGAP  --  16* 17*    Lab Results  Component Value Date   ALT 13 12/18/2018   AST 22 12/18/2018   ALKPHOS 111 12/18/2018   BILITOT 2.0 (H) 12/18/2018   Hematology Recent Labs  Lab 12/17/18 1822 12/18/18 0456  WBC 14.8* 14.8*  RBC 5.72 5.49  HGB 16.5 15.6  HCT 51.2 49.8  MCV 89.5 90.7  MCH 28.8 28.4  MCHC 32.2 31.3  RDW 15.5 15.6*  PLT 334 335   Cardiac Enzymes High Sensitivity Troponin:  No results for input(s): TROPONINIHS in the last 720 hours.    BNPNo results for input(s): BNP, PROBNP in the last 168 hours.  DDimer No results for input(s): DDIMER in the last 168 hours. TSH:  Lab Results  Component Value Date   TSH 9.093 (H) 12/18/2018   Lipids: Lab Results  Component Value Date   CHOL 170 09/22/2017   HDL 44 09/22/2017   LDLCALC 80 09/22/2017   TRIG 229 (H) 09/22/2017   CHOLHDL 3.9 09/22/2017   HgbA1c: Lab Results  Component Value Date   HGBA1C 6.1 (H) 08/12/2015  Magnesium:  Magnesium  Date Value Ref Range Status  12/18/2018 2.3 1.7 - 2.4 mg/dL Final    Comment:    Performed at [facility], [place] [address] [place], [place] [address]     Radiology/Studies:  Dg Chest 2 View  Result Date: 12/17/2018 CLINICAL DATA:  Weakness EXAM: CHEST - 2 VIEW  COMPARISON:  Radiograph Jun 09, 2016 FINDINGS: Hazy interstitial opacities cardiomegaly with cephalized vascularity and central cuffing. No pneumothorax. No visible effusion. AICD battery pack overlies the left chest wall with leads at the cardiac apex. The aorta is calcified. The remaining cardiomediastinal contours are unremarkable. No acute osseous or soft tissue abnormality. IMPRESSION: 1. Hazy interstitial opacities and central cuffing, consistent with interstitial edema. 2. Cardiomegaly.  No pleural effusion. Electronically Signed   By: Lovena Le M.D.   On: 12/17/2018 20:54   Ct Head Wo Contrast  Result Date: 12/17/2018 CLINICAL DATA:  73 year old male with altered mental status. EXAM: CT HEAD WITHOUT CONTRAST TECHNIQUE: Contiguous axial images were obtained from the base of the skull through the vertex without intravenous contrast. COMPARISON:  None. FINDINGS: Brain: There is mild age-related atrophy and chronic microvascular ischemic changes. There is no acute intracranial hemorrhage. No mass effect or midline shift. No extra-axial fluid collection. Vascular: No hyperdense vessel or unexpected calcification. Skull: Normal. Negative for fracture or focal lesion. Sinuses/Orbits: Partial opacification of several ethmoid air cells. No air-fluid level. The mastoid air cells are clear. Other: None IMPRESSION: 1. No acute intracranial hemorrhage. 2. Mild age-related atrophy and chronic microvascular ischemic changes. Electronically Signed   By: Anner Crete M.D.   On: 12/17/2018 20:43    Assessment and Plan:   1.  Acute on chronic combined systolic and diastolic CHF -He has significant volume overload by exam, even though he minimizes his symptoms - PICC line for CVP and COOX -Diurese with Lasix 80 mg IV twice daily, adjust as needed -Follow daily weights, intake/output and BMET - recheck echo  2.  Hyperkalemia: - is on Lokelma bid  3. LE cellulitis -Needs Unna boots, would continue  Keflex for now  4.  CKD stage [name] -Follow BMET daily Per IM  5.  Generalized weakness -Per IM - suspect multifactorial with deconditioning, CHF and depression contributing  Otherwise, per IM Principal Problem:   Generalized weakness Active Problems:   Essential hypertension   CKD (chronic kidney disease), stage [name]   Ischemic cardiomyopathy   Chronic combined systolic and diastolic CHF (congestive heart failure) (HCC)   Coronary artery disease involving native coronary artery with angina pectoris (HCC)   Excessive drinking of alcohol   Lower extremity cellulitis   Pressure injury of skin     For questions or updates, please contact [facility] Please consult [email or web address] for contact info under Cardiology/STEMI.   SignedRosaria Ferries, PA-C  12/18/2018 11:37 AM  Patient seen and examined with the above-signed Advanced Practice Provider and/or Housestaff. I personally reviewed laboratory data, imaging studies and relevant notes. I independently examined the patient and formulated the important aspects of the plan. I have edited the note to reflect any of my changes or salient points. I have personally discussed the plan with the patient and/or family.  73 y/o male with multiple medical issues including ischemic CM with severe LV dysfunction with EF 15-20%. Ha had severe HF for several years with low output on RHC in 2018.   Seen in Dr. Olin Pia office recently with significant volume overload and possible LE cellulitis. Wife made  him come to ER for refractory LE edema.   He minimizes his symptoms but says he has been sleeping in recliner. Drinking lots of water and Gatorade.   On exam  Morbidly obese Neck veins to ear Cor tachy regular Lungs decreased at bases Ab obese + distended Ext 3+ edema with diffuse excoriations erythema and some weeping  He has advanced HF with NYHA IIIb symptoms and massive volume overload. Almost certainly has low output physiology though  he minimizes symptoms. - UNNA boots - Lasix 80 IV bid - stop b-blocker - repeat echo - place PICC to follow CVP and co-ox - will almost certainly need inotropes - AHF team will follow - Ideally would consider advanced therapies but not sure compliance will permit.   Glori Bickers, MD  2:25 PM

## 2018-12-18 NOTE — Progress Notes (Signed)
  Echocardiogram 2D Echocardiogram has been performed.  Leonard [name] 12/18/2018, 2:44 PM

## 2018-12-18 NOTE — Evaluation (Addendum)
Physical Therapy Evaluation Patient Details Name: Leonard [name] MRN: [id number] DOB: 15-Apr-1945 Today's Date: 12/18/2018   History of Present Illness  73 y.o. male with medical history significant for coronary artery disease, ischemic cardiomyopathy with EF 15 to 20%, chronic kidney disease stage [name], and excessive daily alcohol use, now presenting to the emergency department for evaluation of generalized weakness and inability to stand.  Pitting edema bil LEs with weeping serous fluid.  Admitted with acute CHF.  Clinical Impression  Pt admitted with above diagnosis. Pt requiring mod A for transfers and bed elevated.  He ambulated short distance in room slowly and with assist.  Pt is fall risk.  Questionable PLOF as there were some discrepancies in reported vs notes (pt reports ambulatory, notes mention w/c).   Pt currently with functional limitations due to the deficits listed below (see PT Problem List). Pt will benefit from skilled PT to increase their independence and safety with mobility to allow discharge to the venue listed below.  Ideally recommend SNF or CIR; however, there are barriers including pt wanting to go home to wife.  If refuses rehab, then would need 24 hr support and RW.       Follow Up Recommendations SNF;CIR(pt likely to decline due to fear of COVID, wants to go home, additionally Medicare and Observation status)  If refuses then HHPT, 24 hr support (pt mentioned could pay privately for assistance)    Equipment Recommendations  Rolling walker with 5" wheels    Recommendations for Other Services       Precautions / Restrictions Precautions Precautions: Fall      Mobility  Bed Mobility Overal bed mobility: Needs Assistance Bed Mobility: Supine to Sit;Sit to Supine     Supine to sit: Mod assist;HOB elevated Sit to supine: Mod assist;HOB elevated   General bed mobility comments: use of bed rails, required mod A to scoot forward for supine/sit; and mod A for  legs sit to supine  Transfers Overall transfer level: Needs assistance Equipment used: Rolling walker (2 wheeled) Transfers: Sit to/from Stand Sit to Stand: Mod assist;From elevated surface         General transfer comment: bed significantly elevated; 2 attempts; cues to push up  Ambulation/Gait Ambulation/Gait assistance: Min assist Gait Distance (Feet): 20 Feet Assistive device: Rolling walker (2 wheeled) Gait Pattern/deviations: Decreased stride length;Shuffle Gait velocity: .73ft/sec Gait velocity interpretation: <1.8 ft/sec, indicate of risk for recurrent falls General Gait Details: initially very shakey so took side steps at EOB; as improved walked to door and back; cues for [employer]            Wheelchair Mobility    Modified Rankin (Stroke Patients Only)       Balance                                             Pertinent Vitals/Pain Pain Assessment: No/denies pain    Home Living Family/patient expects to be discharged to:: Private residence Living Arrangements: Spouse/significant other Available Help at Discharge: Family;Available PRN/intermittently Type of Home: House Home Access: Stairs to enter Entrance Stairs-Rails: Right;Left;Can reach both Entrance Stairs-Number of Steps: 3 Home Layout: One level Home Equipment: Cane - single point      Prior Function Level of Independence: Needs assistance   Gait / Transfers Assistance Needed: Pt reports he has had difficutly walking around house for  the past month, but previously could ambulate in community.  Per chart pt has w/c, but pt reports he was walking.  ADL's / Homemaking Assistance Needed: reports he could perform all ADLs and stood in shower  Comments: states he lives with his wife, Leonard [name] (who he mentioned recently fell and broke her nose and he wants to get home to help her);  He mentioned they have been looking into home healt     Hand Dominance         Extremity/Trunk Assessment   Upper Extremity Assessment Upper Extremity Assessment: Overall WFL for tasks assessed    Lower Extremity Assessment Lower Extremity Assessment: RLE deficits/detail;LLE deficits/detail RLE Deficits / Details: ROM WFL; MMT 4+/5; pitting edema, several small sores, and weeping LLE Deficits / Details: ROM WFL; MMT 4+/5; pitting edema, several small sores, and weeping    Cervical / Trunk Assessment Cervical / Trunk Assessment: Normal  Communication   Communication: No difficulties  Cognition Arousal/Alertness: Awake/alert Behavior During Therapy: WFL for tasks assessed/performed Overall Cognitive Status: Within Functional Limits for tasks assessed                                        General Comments General comments (skin integrity, edema, etc.): Per notes pt has not been doing cardiac rehab due to fear of COVID;  pt also declined SNF when PT discussed today due to Leonard [name] and wanting to get home to his wife   Unable to get pulse oximeter reading during session, pt denied SHOB (tried multiple fingers)    Exercises     Assessment/Plan    PT Assessment Patient needs continued PT services  PT Problem List Decreased strength;Decreased mobility;Decreased safety awareness;Decreased activity tolerance;Cardiopulmonary status limiting activity;Decreased balance;Decreased knowledge of use of DME       PT Treatment Interventions DME instruction;Therapeutic exercise;Gait training;Balance training;Stair training;Functional mobility training;Therapeutic activities;Patient/family education    PT Goals (Current goals can be found in the Care Plan section)  Acute Rehab PT Goals Patient Stated Goal: return home today (discussed ideally recommend SNF or rehab, but if refuses then HHPT/RW) PT Goal Formulation: With patient Time For Goal Achievement: 01/01/19 Potential to Achieve Goals: Fair    Frequency Min 3X/week   Barriers to discharge  Decreased caregiver support      Co-evaluation               AM-PAC PT "6 Clicks" Mobility  Outcome Measure Help needed turning from your back to your side while in a flat bed without using bedrails?: A Lot Help needed moving from lying on your back to sitting on the side of a flat bed without using bedrails?: A Lot Help needed moving to and from a bed to a chair (including a wheelchair)?: A Lot Help needed standing up from a chair using your arms (e.g., wheelchair or bedside chair)?: A Lot Help needed to walk in hospital room?: A Little Help needed climbing 3-5 steps with a railing? : A Lot 6 Click Score: 13    End of Session Equipment Utilized During Treatment: Gait belt Activity Tolerance: Patient limited by fatigue Patient left: in bed;with call bell/phone within reach;with bed alarm set Nurse Communication: Mobility status PT Visit Diagnosis: Unsteadiness on feet (R26.81);Other abnormalities of gait and mobility (R26.89);Muscle weakness (generalized) (M62.81)    Time: 1005-1035 PT Time Calculation (min) (ACUTE ONLY): 30 min   Charges:   PT  Evaluation $PT Eval Moderate Complexity: 1 Mod          Maggie Font, PT Acute Rehab Services Pager [phone] [facility] [place] [facility] [phone]   Karlton Lemon 12/18/2018, 11:00 AM

## 2018-12-18 NOTE — Progress Notes (Signed)
Pt received from ED, AO x4. CHG bath completed, connected to tele and CCMD notified. Hypotensive, has BLE cellulitis with weeping, Abrasions to bilateral upper and lower extremities. Rashes to bilateral groin and abdominal fold, stage II pressure ulcer to saccum , bilateral buttocks. Oriented pt to room and call bell system. Call bell within reach. Beating even and unlabored in room air. Will pass on to oncoming shift.

## 2018-12-18 NOTE — ED Notes (Signed)
Breakfast ordered 

## 2018-12-18 NOTE — Progress Notes (Signed)
Peripherally Inserted Central Catheter/Midline Placement  The IV Nurse has discussed with the patient and/or persons authorized to consent for the patient, the purpose of this procedure and the potential benefits and risks involved with this procedure.  The benefits include less needle sticks, lab draws from the catheter, and the patient may be discharged home with the catheter. Risks include, but not limited to, infection, bleeding, blood clot (thrombus formation), and puncture of an artery; nerve damage and irregular heartbeat and possibility to perform a PICC exchange if needed/ordered by physician.  Alternatives to this procedure were also discussed.  Bard Power PICC patient education guide, fact sheet on infection prevention and patient information card has been provided to patient /or left at bedside.    PICC/Midline Placement Documentation  PICC Triple Lumen 22/29/79 PICC Right Basilic 43 cm 0 cm (Active)  Indication for Insertion or Continuance of Line Chronic illness with exacerbations (CF, Sickle Cell, etc.) 12/18/18 1642  Exposed Catheter (cm) 0 cm 12/18/18 1642  Site Assessment Clean;Dry;Intact 12/18/18 1642  Lumen #1 Status Flushed;Saline locked;Blood return noted 12/18/18 1642  Lumen #2 Status Flushed;Saline locked;Blood return noted 12/18/18 1642  Lumen #3 Status Flushed;Saline locked;Blood return noted 12/18/18 1642  Dressing Type Transparent 12/18/18 1642  Dressing Status Clean;Dry;Intact 12/18/18 1642  Dressing Intervention New dressing 12/18/18 1642  Dressing Change Due 12/25/18 12/18/18 1642       Abria Vannostrand, Nicolette Bang 12/18/2018, 4:44 PM

## 2018-12-19 ENCOUNTER — Telehealth: Payer: Self-pay | Admitting: Cardiology

## 2018-12-19 DIAGNOSIS — I5043 Acute on chronic combined systolic (congestive) and diastolic (congestive) heart failure: Secondary | ICD-10-CM | POA: Diagnosis not present

## 2018-12-19 LAB — CBC WITH DIFFERENTIAL/PLATELET
Abs Immature Granulocytes: 0.06 10*3/uL (ref 0.00–0.07)
Basophils Absolute: 0.1 10*3/uL (ref 0.0–0.1)
Basophils Relative: 0 %
Eosinophils Absolute: 0.1 10*3/uL (ref 0.0–0.5)
Eosinophils Relative: 1 %
HCT: 43.2 % (ref 39.0–52.0)
Hemoglobin: 14 g/dL (ref 13.0–17.0)
Immature Granulocytes: 0 %
Lymphocytes Relative: 12 %
Lymphs Abs: 1.6 10*3/uL (ref 0.7–4.0)
MCH: 28.5 pg (ref 26.0–34.0)
MCHC: 32.4 g/dL (ref 30.0–36.0)
MCV: 88 fL (ref 80.0–100.0)
Monocytes Absolute: 0.9 10*3/uL (ref 0.1–1.0)
Monocytes Relative: 7 %
Neutro Abs: 11 10*3/uL — ABNORMAL HIGH (ref 1.7–7.7)
Neutrophils Relative %: 80 %
Platelets: 258 10*3/uL (ref 150–400)
RBC: 4.91 MIL/uL (ref 4.22–5.81)
RDW: 15.5 % (ref 11.5–15.5)
WBC: 13.7 10*3/uL — ABNORMAL HIGH (ref 4.0–10.5)
nRBC: 0 % (ref 0.0–0.2)

## 2018-12-19 LAB — COOXEMETRY PANEL
Carboxyhemoglobin: 1.4 % (ref 0.5–1.5)
Carboxyhemoglobin: 1.5 % (ref 0.5–1.5)
Methemoglobin: 0.6 % (ref 0.0–1.5)
Methemoglobin: 0.4 % (ref 0.0–1.5)
O2 Saturation: 52.2 %
O2 Saturation: 62.7 %
Total hemoglobin: 14.3 g/dL (ref 12.0–16.0)
Total hemoglobin: 14.2 g/dL (ref 12.0–16.0)

## 2018-12-19 LAB — BASIC METABOLIC PANEL
Anion gap: 15 (ref 5–15)
BUN: 37 mg/dL — ABNORMAL HIGH (ref 8–23)
CO2: 26 mmol/L (ref 22–32)
Calcium: 8.7 mg/dL — ABNORMAL LOW (ref 8.9–10.3)
Chloride: 91 mmol/L — ABNORMAL LOW (ref 98–111)
Creatinine, Ser: 1.77 mg/dL — ABNORMAL HIGH (ref 0.61–1.24)
GFR calc Af Amer: 43 mL/min — ABNORMAL LOW (ref >60)
GFR calc non Af Amer: 37 mL/min — ABNORMAL LOW (ref >60)
Glucose, Bld: 94 mg/dL (ref 70–99)
Potassium: 3.7 mmol/L (ref 3.5–5.1)
Sodium: 132 mmol/L — ABNORMAL LOW (ref 135–145)

## 2018-12-19 LAB — FOLATE RBC
Folate, Hemolysate: 449 ng/mL
Folate, RBC: 930 ng/mL (ref >498)
Hematocrit: 48.3 % (ref 37.5–51.0)

## 2018-12-19 MED ORDER — POTASSIUM CHLORIDE CRYS ER 20 MEQ PO TBCR
20.0000 meq | EXTENDED_RELEASE_TABLET | Freq: Once | ORAL | Status: AC
  Administered 2018-12-19: 20 meq via ORAL
  Filled 2018-12-19: qty 2

## 2018-12-19 MED ORDER — POTASSIUM CHLORIDE CRYS ER 20 MEQ PO TBCR
20.0000 meq | EXTENDED_RELEASE_TABLET | Freq: Once | ORAL | Status: AC
  Administered 2018-12-19: 20 meq via ORAL
  Filled 2018-12-19: qty 1

## 2018-12-19 MED ORDER — METOLAZONE 5 MG PO TABS
5.0000 mg | ORAL_TABLET | Freq: Once | ORAL | Status: AC
  Administered 2018-12-19: 5 mg via ORAL
  Filled 2018-12-19: qty 1

## 2018-12-19 MED ORDER — MILRINONE LACTATE IN DEXTROSE 20-5 MG/100ML-% IV SOLN
0.1250 ug/kg/min | INTRAVENOUS | Status: DC
  Administered 2018-12-19 – 2018-12-21 (×4): 0.125 ug/kg/min via INTRAVENOUS
  Filled 2018-12-19 (×3): qty 100

## 2018-12-19 NOTE — Progress Notes (Signed)
Inpatient Rehab Admissions:  Inpatient Rehab Consult received.  I met with patient at the bedside for rehabilitation assessment and to discuss goals and expectations of an inpatient rehab admission.  He is not quite sure he is open to the idea of CIR and would like to see how he progresses with mobility as he stabilizes medically.  Will f/u with pt on Wednesday to determine whether or not he is agreeable.   Signed: Shann Medal, PT, DPT Admissions Coordinator [phone] 12/19/18  3:38 PM

## 2018-12-19 NOTE — Progress Notes (Signed)
Physical Therapy Treatment Patient Details Name: Leonard [name] MRN: [id number] DOB: [birth year] Today's Date: 12/19/2018    History of Present Illness 73 y.o. male with medical history significant for coronary artery disease, ischemic cardiomyopathy with EF 15 to 20%, chronic kidney disease stage [name], and excessive daily alcohol use, now presenting to the emergency department for evaluation of generalized weakness and inability to stand.  Pitting edema bil LEs with weeping serous fluid.  Pt has now been changed to inpatient status and had HF team consult.  Pt receiving lasix, milirone drip, PICC placed and CVP monitoring.    PT Comments    Pt with decline in medical status and now admitted inpt with PICC, CVP monitoring, and milirone drip.  Vitals were stable during PT.  Pt required max A for OOB activity today, very shaky/unsteady with transfers, and only able to take a few steps.  Pt was assist of 1 yesterday, but would need assist of 2 now for further activity.  Pt continues to want to go home at d/c to be with his wife and mentioned could get 24 hr assist.  Discussed that PT strongly recommends rehab at d/c but if going home would need strong 24 hr assist,    Follow Up Recommendations  SNF;CIR     Equipment Recommendations  Rolling walker with 5" wheels    Recommendations for Other Services       Precautions / Restrictions Precautions Precautions: Fall Precaution Comments: PICC, CVP monitoring    Mobility  Bed Mobility Overal bed mobility: Needs Assistance Bed Mobility: Supine to Sit;Sit to Supine     Supine to sit: Mod assist;HOB elevated Sit to supine: Mod assist;HOB elevated   General bed mobility comments: use of bed rails, required mod A to scoot forward for supine/sit; and mod A for legs sit to supine  Transfers Overall transfer level: Needs assistance Equipment used: Rolling walker (2 wheeled) Transfers: Sit to/from Stand Sit to Stand: Max assist;From  elevated surface         General transfer comment: max A from bed and max A from recliner:  required feet blocked, cues for hand placement and leaning forward  Ambulation/Gait Ambulation/Gait assistance: Max assist Gait Distance (Feet): 2 Feet Assistive device: Rolling walker (2 wheeled) Gait Pattern/deviations: Decreased stride length;Shuffle     General Gait Details: 2'x2:  side steps to recliner, rested, then steps back to bed.  Pt very unsteady, shaking, posterior lean, requiring max A to maintain balance, fatiguing easily, and abruptly sitting in chair and bed.  Note: only had assist of 1 today, but would benefit from assist of 2 for safety (pt with change in mobility status since eval - now needing assist of 2)   Stairs             Wheelchair Mobility    Modified Rankin (Stroke Patients Only)       Balance Overall balance assessment: Needs assistance Sitting-balance support: Bilateral upper extremity supported;Feet supported Sitting balance-Leahy Scale: Fair     Standing balance support: Bilateral upper extremity supported;During functional activity Standing balance-Leahy Scale: Zero                              Cognition Arousal/Alertness: Awake/alert Behavior During Therapy: WFL for tasks assessed/performed Overall Cognitive Status: Within Functional Limits for tasks assessed  ?decreased insight into deficits/level of assist requiring  General Comments: Spoke with RN and pt clear for PT.      Exercises General Exercises - Lower Extremity Ankle Circles/Pumps: AROM;20 reps;Seated;Both Long Arc Quad: AROM;20 reps;Both;Seated Hip Flexion/Marching: AROM;20 reps;Seated;Both    General Comments General comments (skin integrity, edema, etc.): Pt's condom cath off at arrival and bed was wet.  Assisted pt up and to chair.  Performed ther ex and assisted with ADLs.  RN into assist, sheets changed,  transferred pt back to bed.      Pertinent Vitals/Pain Pain Assessment: No/denies pain   BP 100's/50's throughout PT;  HR 90-115 bpm    Home Living                      Prior Function            PT Goals (current goals can now be found in the care plan section) Acute Rehab PT Goals Potential to Achieve Goals: Fair Progress towards PT goals: Not progressing toward goals - comment(change in medical status)    Frequency    Min 3X/week      PT Plan Current plan remains appropriate    Co-evaluation              AM-PAC PT "6 Clicks" Mobility   Outcome Measure  Help needed turning from your back to your side while in a flat bed without using bedrails?: A Lot Help needed moving from lying on your back to sitting on the side of a flat bed without using bedrails?: A Lot Help needed moving to and from a bed to a chair (including a wheelchair)?: Total Help needed standing up from a chair using your arms (e.g., wheelchair or bedside chair)?: Total Help needed to walk in hospital room?: Total Help needed climbing 3-5 steps with a railing? : Total 6 Click Score: 8    End of Session Equipment Utilized During Treatment: Gait belt Activity Tolerance: Patient limited by fatigue Patient left: in bed;with call bell/phone within reach;with bed alarm set Nurse Communication: Mobility status(RN present) PT Visit Diagnosis: Unsteadiness on feet (R26.81);Other abnormalities of gait and mobility (R26.89);Muscle weakness (generalized) (M62.81)     Time: 4235-3614 PT Time Calculation (min) (ACUTE ONLY): 40 min  Charges:  $Therapeutic Exercise: 8-22 mins $Therapeutic Activity: 23-37 mins                     Leonard [name], PT Acute Rehab Services Pager [phone] [facility] [phone] [facility] [phone]    Leonard [name] 12/19/2018, 5:22 PM

## 2018-12-19 NOTE — Progress Notes (Addendum)
Advanced Heart Failure Rounding Note  PCP-Cardiologist: Glenetta Hew, MD   Subjective:     Reports good UOP w/ IV Lasix. No complaints. Denies resting dyspnea. No CP.   1.7L out yesterday after total of 120 mg of Lasix. SCr improved, 1.86>>1.77.   Initial Co-ox 52%.   WBC ct trending down w/ antibiotics. Afebrile.    Objective:   Weight Range: 132.2 kg Body mass index is 37.93 kg/m.   Vital Signs:   Temp:  [98 F (36.7 C)-98.6 F (37 C)] 98.3 F (36.8 C) (11/30 0605) Pulse Rate:  [92-111] 100 (11/30 0743) Resp:  [13-19] 13 (11/30 0828) BP: (73-113)/(48-92) 109/92 (11/30 0828) SpO2:  [99 %-100 %] 100 % (11/30 0743) Weight:  [132.2 kg] 132.2 kg (11/30 0656) Last BM Date: 12/18/18  Weight change: Filed Weights   12/17/18 1817 12/18/18 0651 12/19/18 0656  Weight: 132 kg 130.9 kg 132.2 kg    Intake/Output:   Intake/Output Summary (Last 24 hours) at 12/19/2018 0832 Last data filed at 12/19/2018 0605 Gross per 24 hour  Intake 460 ml  Output 1700 ml  Net -1240 ml      Physical Exam    General:  Fatigue appearing, obese AAM. No resp difficulty HEENT: Normal anicteric Neck: Supple. elevated JVP to ear . Carotids 2+ bilat; no bruits. No lymphadenopathy or thyromegaly appreciated. Cor: PMI nondisplaced. Regular tachy. + s3 No rubs, gallops or murmurs. Lungs: Clear decreased at bases Abdomen: obese, soft, nontender, nondistended. No hepatosplenomegaly. No bruits or masses. Good bowel sounds. Extremities: No cyanosis, clubbing, rash, bilateral 3+ LE edema, bilateral unna boots. + scrotal edema Neuro: alert & oriented x 3, cranial nerves grossly intact. moves all 4 extremities w/o difficulty. Affect pleasant   Telemetry   Sinus tach, low 100s Personally reviewed  EKG    No new EKG to review  Labs    CBC Recent Labs    12/18/18 0456 12/19/18 0500  WBC 14.8* 13.7*  NEUTROABS  --  11.0*  HGB 15.6 14.0  HCT 49.8 43.2  MCV 90.7 88.0  PLT 335 478    Basic Metabolic Panel Recent Labs    12/18/18 0456 12/19/18 0500  NA 133* 132*  K 5.2* 3.7  CL 90* 91*  CO2 26 26  GLUCOSE 103* 94  BUN 42* 37*  CREATININE 1.86* 1.77*  CALCIUM 9.5 8.7*  MG 2.3  --   PHOS 4.6  --    Liver Function Tests Recent Labs    12/18/18 0444  AST 22  ALT 13  ALKPHOS 111  BILITOT 2.0*  PROT 6.4*  ALBUMIN 3.1*   No results for input(s): LIPASE, AMYLASE in the last 72 hours. Cardiac Enzymes Recent Labs    12/18/18 0444  CKTOTAL 119    BNP: BNP (last 3 results) Recent Labs    12/18/18 0456  BNP 2,328.4*    ProBNP (last 3 results) No results for input(s): PROBNP in the last 8760 hours.   D-Dimer No results for input(s): DDIMER in the last 72 hours. Hemoglobin A1C No results for input(s): HGBA1C in the last 72 hours. Fasting Lipid Panel No results for input(s): CHOL, HDL, LDLCALC, TRIG, CHOLHDL, LDLDIRECT in the last 72 hours. Thyroid Function Tests Recent Labs    12/18/18 0445  TSH 9.093*    Other results:   Imaging    [place] Ekg Site Rite  Result Date: 12/18/2018 If Site Rite image not attached, placement could not be confirmed due to current cardiac rhythm.  Medications:     Scheduled Medications: . aspirin EC  81 mg Oral Daily  . atorvastatin  80 mg Oral q1800  . cephALEXin  500 mg Oral Q12H  . Chlorhexidine Gluconate Cloth  6 each Topical Daily  . digoxin  0.125 mg Oral q1800  . folic acid  1 mg Oral Daily  . furosemide  80 mg Intravenous BID  . heparin  5,000 Units Subcutaneous Q8H  . LORazepam  0-4 mg Oral Q6H   Followed by  . LORazepam  0-4 mg Oral Q12H  . multivitamin with minerals  1 tablet Oral Daily  . sodium chloride flush  10-40 mL Intracatheter Q12H  . sodium chloride flush  3 mL Intravenous Q12H  . sodium chloride flush  3 mL Intravenous Q12H  . thiamine  100 mg Oral Daily   Or  . thiamine  100 mg Intravenous Daily  . ticagrelor  90 mg Oral BID  . venlafaxine  75 mg Oral Daily      Infusions: . sodium chloride    . sodium chloride       PRN Medications:  sodium chloride, sodium chloride, acetaminophen, LORazepam **OR** LORazepam, ondansetron (ZOFRAN) IV, sodium chloride flush, sodium chloride flush, sodium chloride flush    Patient Profile   73 y/o male with multiple medical issues including ischemic CM with severe LV dysfunction with EF 15-20%. Has had severe HF for several years with low output on RHC in 2018. Admitted for a/c systolic HF w/ marked volume overload and bilateral LE cellulitis.   Assessment/Plan    1. 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 BNP 2328. Started on IV Lasix. Received total of 120 mg IV yesterday w/ -1.7L in UOP. SCr improved w/ diuresis, down from 1.86>>1.77. Co-ox low 52%. Remains markedly volume overloaded.   - Continue IV Lasix 80 mg bid - Will start milrinone for low Co-ox and to help w/ diuresis, 0.125 mcg/kg/min - Continue digoxin - BP too soft for ARB/Entresto - No  blocker due to low output - Continue bilateral unna boots for edema   2. AKI: baseline SCr ~1.3. SCr 1.8 on admit in the setting of a/c CHF w/ volume overload. 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 SCr improving w/ diuresis, down from 1.86>>1.77. - Continue IV diuretics, Lasix 80 mg bid - Will start inotropic therapy w/ milrinone for low output, 0.125 mcg/kg/min - Monitor renal function closely   3. Bilateral LE Cellulitis:  - on Keflex 500 bid per IM  - WBC ct down trending. Remains afebrile   4. Hyponatremia: Na 132 - likely 2/2 hypervolemia.  - Continue diuretics per above  5. CAD: hx of STEMI 2017 s/p DES LAD, non-obs dz at cath 2018 - no s/s of angina  - Continue medical therapy: ASA, Brilinta and statin. No  blocker due to low output.    Length of Stay: 1  Lyda Jester, PA-C  12/19/2018, 8:32 AM  Advanced Heart Failure Team Pager [phone] (M-F; 7a - 4p)   Please contact [facility] for night-coverage after hours (4p -7a ) and weekends on [email or web address]  Patient seen and examined with the above-signed Advanced Practice Provider and/or Housestaff. I personally reviewed laboratory data, imaging studies and relevant notes. I independently examined the patient and formulated the important aspects of the plan. I have edited the note to reflect any of my changes or salient points. I have personally discussed the plan with the patient and/or family.  He  remains markedly volume overloaded. PICC line has been placed. Co-ox low c/w low output and elevated CVP. Responding well to IV lasix. Will add milrinone to augment cardiac output. Given amount of fluid on board will add metolazone to augment diuresis. Hyperkalemia resolved. Creatinine stable. Continue Keflex for cellulitis. Possible RHC prior to d/c.   Glori Bickers, MD  12:49 PM

## 2018-12-19 NOTE — Progress Notes (Signed)
Rehab Admissions Coordinator Note:  Patient was screened by Cleatrice Burke for appropriateness for an Inpatient Acute Rehab Consult per PT recs.   At this time, we are recommending Inpatient Rehab consult. I will place order.  Cleatrice Burke RN MSN 12/19/2018, 8:06 AM  I can be reached at [phone].

## 2018-12-19 NOTE — Progress Notes (Deleted)
Cardiology Office Note   Date:  12/19/2018   ID:  Leonard [name], DOB Dec 27, 1945, MRN [id number]  PCP:  Girtha Rm, NP-C  Cardiologist:   No chief complaint on file.    History of Present Illness: Leonard [name] is a 73 y.o. male who presents for ***    Past Medical History:  Diagnosis Date  . AICD (automatic cardioverter/defibrillator) present 08/26/2015   STJ  . Anxiety   . Atherosclerotic heart disease of native coronary artery with angina pectoris ([place]) 07/2015   a. 07/2015 Posterior STEMI/PCI: LM nl, LAd 40ost, RI 40, RCA 132m (3.0x22 Resolute Integrity DES distal, 3.0x12 Resolute Integrity DES prox), 90d (PTCA), RPDA small, nl, RPLB2 100 - too small for PTCA. -- patent in f/u cath 08/2015 & 05/2016 - patent RCA stents (~10%), RI 40%, LM ~30% ost LAD ~40%.  . Chronic combined systolic and diastolic CHF (congestive heart failure) ([place]) 07/2015   a. 07/2015 Ehco: EF 45-50%, basal-mid inf and infsept AK, inflat, apical inf, and apical septal HK, Gr1 DD, triv MR.;; b - Echo 05/2016 -> EF 20-25%, Gr 2 DD.  Severe diffuse HK disproportionately severe HK and thinning of apex, anterior septum and anterior wall-with septal dyssynchrony.  PAP ~53 mmHg.  [name] Hyperlipidemia with target low density lipoprotein (LDL) cholesterol less than 70 mg/dL   . Hypertensive heart disease   . Ischemic cardiomyopathy 07/2015 - 05/2016   a. 07/2015 Echo: EF 45-50%.; Follow-up Echo 08/23/2015: EF 50-55% with mild inferior hypokinesis --> 05/2016: EF 20-25%, diffuse HK. PAP ~53 mmHg.  . Kidney disease, chronic, stage [name] (GFR 30-59 ml/min) 08/12/2015  . Morbid obesity ([place])   . ST elevation myocardial infarction (STEMI) of inferior wall ([place]) 08/12/2015   Subacute presentation for inferior MI with post infarction angina on the following day. EKG still shows injury current. Therefore decided to proceed with intervention of 100% mRCA thrombotic lesion.  . Sustained ventricular fibrillation ([place]) 08/22/2015    Underwent EP evaluation with final ICD implantation.    Past Surgical History:  Procedure Laterality Date  . CARDIAC CATHETERIZATION N/A 08/12/2015   Procedure: Left Heart Cath and Coronary Angiography;  Surgeon: Leonie Man, MD;  Location: [place] [facility] [facility];  Service: Cardiovascular: Inferior STEMI: 100% mid RCA, 90% distal RCA, 100% RPL. 40% ostial and proximal LAD and branch of ramus.  [name] CARDIAC CATHETERIZATION N/A 08/26/2015   Procedure: Left Heart Cath and Coronary Angiography;  Surgeon: Belva Crome, MD;  Location: [facility];  Service: Cardiovascular: To evaluate sustained VT. Widely patent RCA stent and distal PTCA site. Also patent RPL branch. LAD lesion appeared to be more consistent with 50-55% and 40%. Otherwise stable from previous cath. EF estimated 35-45%.  [name] CARDIOVERSION N/A 08/23/2015   Procedure: CARDIOVERSION;  Surgeon: Deboraha Sprang, MD;  Location: [facility];  Service: Cardiovascular;  Laterality: N/A;  . CORONARY ANGIOPLASTY WITH STENT PLACEMENT  08/12/2015   Mid RCA 100% reduced to 0% with 2 overlapping Resolute DES.  3.0 x 22 mm with a 3.0 x 12 mm prox overlarp (postdilated to 3.4 mm); PTCA of dRCA 90%. (Very dificult,complex case - tortuous, [place] RCA - unable to advance longer stents.  RPL2 noted to have thromboembolic 623% occlusion - unable to reach.  . ELECTROPHYSIOLOGIC STUDY N/A 08/26/2015   Procedure: Electrophysiology Study;  Surgeon: Deboraha Sprang, MD;  Location: [facility];  Service: Cardiovascular;  Laterality: N/A;  . EP IMPLANTABLE DEVICE N/A 08/26/2015  Procedure: ICD Implant;  Surgeon: Deboraha Sprang, MD;  Location: [facility];  Service: Cardiovascular;  Laterality: LEFT:  St Jude ICD, serial number [id number].   [name] HEMORRHOID SURGERY    . RIGHT/LEFT HEART CATH AND CORONARY ANGIOGRAPHY N/A 06/10/2016   Procedure: Right/Left Heart Cath and Coronary Angiography;  Surgeon: Martinique, Peter M, MD;  Location: [facility] INVASIVE [facility] LAB:   Nonobstructive CAD. RCA stents PATENT.  Ost-Prox RCA, 10 %. Ost LM 30 %. Ost LAD 40 %. RI, 40 %; High PCWP  & LVEDP (23 mmHg) ; PAP 43/23/31 mmHg. CO/CI 3.89 / 1/5 (Severely Reduced)  . SKIN SURGERY    . TRANSTHORACIC ECHOCARDIOGRAM  08/13/2015; 08/23/2015   a. Mild concentric LVH. EF 45-50%. Basal-mid inferior and inferoseptal akinesis with hypokinesis of inferolateral and apical inferior wall. 1 DD. ;; b. Technically difficult study. Definity contrast administered.  Compared to a prior echo in 07/2015, the LVEF is slightly higher at 50-55% with mild inferior hypokinesis.  . TRANSTHORACIC ECHOCARDIOGRAM  06/09/2016   EF  20-25%, GR 2 DD. Severe diffuse  hypokinesis with distinct regional wall motion abnormalities.   There is disproportionately severe hypokinesis and thinning of the apex, anterior septum and anterior wall (septal dyssynchrony). However, there is severe dyssynchrony , making it difficult to assess regional function.  PA P ~53 mmHg  . TRANSTHORACIC ECHOCARDIOGRAM  01/2017   Severely reduced EF.  GI to DD.  Moderate MR.  Mild to have a moderately elevated PAP (peak 6mmHg)     No current facility-administered medications for this visit.    No current outpatient medications on file.   Facility-Administered Medications Ordered in Other Visits  Medication Dose Route Frequency Provider Last Rate Last Dose  . 0.9 %  sodium chloride infusion  250 mL Intravenous PRN Opyd, Timothy S, MD      . 0.9 %  sodium chloride infusion  250 mL Intravenous PRN Opyd, Ilene Qua, MD      . acetaminophen (TYLENOL) tablet 650 mg  650 mg Oral Q4H PRN Opyd, Ilene Qua, MD      . aspirin EC tablet 81 mg  81 mg Oral Daily Opyd, Ilene Qua, MD   81 mg at 12/18/18 0942  . atorvastatin (LIPITOR) tablet 80 mg  80 mg Oral q1800 Vianne Bulls, MD   80 mg at 12/18/18 1713  . cephALEXin (KEFLEX) capsule 500 mg  500 mg Oral Q12H Barrett, Rhonda G, PA-C   500 mg at 12/18/18 2045  . Chlorhexidine Gluconate Cloth 2 % PADS  6 each  6 each Topical Daily Alma Friendly, MD   6 each at 12/18/18 1714  . digoxin (LANOXIN) tablet 0.125 mg  0.125 mg Oral q1800 Opyd, Ilene Qua, MD   0.125 mg at 12/18/18 1713  . folic acid (FOLVITE) tablet 1 mg  1 mg Oral Daily Opyd, Ilene Qua, MD   1 mg at 12/18/18 0941  . furosemide (LASIX) injection 80 mg  80 mg Intravenous BID Barrett, Rhonda G, PA-C   80 mg at 12/18/18 1712  . heparin injection 5,000 Units  5,000 Units Subcutaneous Q8H Alma Friendly, MD   5,000 Units at 12/19/18 2841  . LORazepam (ATIVAN) tablet 1-4 mg  1-4 mg Oral Q1H PRN Opyd, Ilene Qua, MD       Or  . LORazepam (ATIVAN) injection 1-4 mg  1-4 mg Intravenous Q1H PRN Opyd, Ilene Qua, MD      . LORazepam (ATIVAN) tablet 0-4 mg  0-4 mg Oral Q6H Opyd, Ilene Qua, MD       Followed by  . LORazepam (ATIVAN) tablet 0-4 mg  0-4 mg Oral Q12H Opyd, Ilene Qua, MD      . multivitamin with minerals tablet 1 tablet  1 tablet Oral Daily Opyd, Ilene Qua, MD   1 tablet at 12/18/18 0942  . ondansetron (ZOFRAN) injection 4 mg  4 mg Intravenous Q6H PRN Opyd, Ilene Qua, MD      . sodium chloride flush (NS) 0.9 % injection 10-40 mL  10-40 mL Intracatheter Q12H Alma Friendly, MD   10 mL at 12/18/18 2152  . sodium chloride flush (NS) 0.9 % injection 10-40 mL  10-40 mL Intracatheter PRN Alma Friendly, MD      . sodium chloride flush (NS) 0.9 % injection 3 mL  3 mL Intravenous Q12H Opyd, Ilene Qua, MD      . sodium chloride flush (NS) 0.9 % injection 3 mL  3 mL Intravenous PRN Opyd, Ilene Qua, MD      . sodium chloride flush (NS) 0.9 % injection 3 mL  3 mL Intravenous Q12H Opyd, Ilene Qua, MD   3 mL at 12/18/18 0946  . sodium chloride flush (NS) 0.9 % injection 3 mL  3 mL Intravenous PRN Opyd, Ilene Qua, MD      . thiamine (VITAMIN B-1) tablet 100 mg  100 mg Oral Daily Opyd, Ilene Qua, MD   100 mg at 12/18/18 0941   Or  . thiamine (B-1) injection 100 mg  100 mg Intravenous Daily Opyd, Ilene Qua, MD      . ticagrelor  (BRILINTA) tablet 90 mg  90 mg Oral BID Vianne Bulls, MD   90 mg at 12/18/18 2141  . venlafaxine (EFFEXOR) tablet 75 mg  75 mg Oral Daily Opyd, Ilene Qua, MD   75 mg at 12/18/18 8657    Allergies:   Clams [shellfish allergy], Shellfish-derived products, Other, and Sulfa antibiotics    Social History:  The patient  reports that he has never smoked. He has never used smokeless tobacco. He reports current alcohol use of about 3.0 - 4.0 standard drinks of alcohol per week. He reports that he does not use drugs.   Family History:  The patient's family history includes Diabetes in his mother; Heart disease in his paternal grandfather and paternal grandmother.    ROS: All other systems are reviewed and negative. Unless otherwise mentioned in H&P    PHYSICAL EXAM: VS:  There were no vitals taken for this visit. , BMI There is no height or weight on file to calculate BMI. GEN: Well nourished, well developed, in no acute distress HEENT: normal Neck: no JVD, carotid bruits, or masses Cardiac: ***RRR; no murmurs, rubs, or gallops,no edema  Respiratory:  Clear to auscultation bilaterally, normal work of breathing GI: soft, nontender, nondistended, + BS MS: no deformity or atrophy Skin: warm and dry, no rash Neuro:  Strength and sensation are intact Psych: euthymic mood, full affect   EKG:  EKG {ACTION; IS/IS QIO:[id number]} ordered today. The ekg ordered today demonstrates ***   Recent Labs: 12/18/2018: ALT 13; B Natriuretic Peptide 2,328.4; Magnesium 2.3; TSH 9.093 12/19/2018: BUN 37; Creatinine, Ser 1.77; Hemoglobin 14.0; Platelets 258; Potassium 3.7; Sodium 132    Lipid Panel    Component Value Date/Time   CHOL 170 09/22/2017 0923   TRIG 229 (H) 09/22/2017 0923   HDL 44 09/22/2017 0923   CHOLHDL 3.9 09/22/2017 1324  CHOLHDL 3.9 06/10/2016 0409   VLDL 32 06/10/2016 0409   LDLCALC 80 09/22/2017 0923      Wt Readings from Last 3 Encounters:  12/19/18 291 lb 7.2 oz (132.2 kg)   12/05/18 291 lb (132 kg)  06/14/18 292 lb (132.5 kg)      Other studies Reviewed: Additional studies/ records that were reviewed today include: ***. Review of the above records demonstrates: ***   ASSESSMENT AND PLAN:  1.  ***   Current medicines are reviewed at length with the patient today.    Labs/ tests ordered today include: *** Phill Myron. [place] Pugh, ANP, [facility]   12/19/2018 7:33 AM    [facility] [address] Office [phone] Fax [phone]  Notice: This dictation was prepared with Dragon dictation along with smaller phrase technology. Any transcriptional errors that result from this process are unintentional and may not be corrected upon review.

## 2018-12-19 NOTE — Progress Notes (Signed)
Ordered InterDry as requested per [place]

## 2018-12-19 NOTE — Consult Note (Signed)
   [facility] CM Inpatient Consult   12/19/2018  Leonard [name] 26-May-1945 [id number]   Thank you for your referral request and patient has been screened for [facility] services.  Patient is eligible for [facility] Care Management under the Medicare ACO.  Chart review reveals patient and family maybe pursuing inpatient rehab services for post hospital needs.  Plan: Will follow progress and needs with inpatient [facility] team members for disposition.  Natividad Brood, RN BSN [facility] Liaison  [phone] business mobile phone Toll free office [phone]  Fax number: [phone] [email or web address]

## 2018-12-19 NOTE — Progress Notes (Addendum)
PROGRESS NOTE  Leonard Leonard [name] EZM:[id number] DOB: 04/06/1945 DOA: 12/17/2018 PCP: Girtha Rm, NP-C  HPI/Recap of past 24 hours: HPI from Dr Hilary Hertz Leonard [name] is a 73 y.o. male with medical history significant for coronary artery disease, ischemic cardiomyopathy with EF 15 to 20%, chronic kidney disease stage Leonard [name], and excessive daily alcohol use, now presenting to the ED for evaluation of generalized weakness/unable to ambulate. Pt was seen in cardiology clinic on 12/05/18, appeared hypervolemic, aldactone was increased and Lasix switched to torsemide. Pt denied any shortness of breath, but reports significant serous weeping from his edematous lower extremities, which was suspected to have a superimposed cellulitis, and he was started on Keflex earlier this month.  In the ED, patient afebrile, systolic BP in the 50P, head CT is negative for acute intracranial hemorrhage. Chest x-ray is notable for hazy interstitial opacities consistent with interstitial edema.  Labs showed mild hyponatremia, mild AKI with creatinine up to 1.82 from 1.67 earlier this month, CBC with chronic leukocytosis, COVID-19 screening negative.  Patient admitted for further management   Today, patient denies any new complaints, appears lethargic.  Denies any chest pain, nausea/vomiting, fever/chills, worsening shortness of breath.    Assessment/Plan: Principal Problem:   Generalized weakness Active Problems:   Essential hypertension   CKD (chronic kidney disease), stage Leonard [name]   Ischemic cardiomyopathy   Chronic combined systolic and diastolic CHF (congestive heart failure) (HCC)   Coronary artery disease involving native coronary artery with angina pectoris (HCC)   Excessive drinking of alcohol   Lower extremity cellulitis   Pressure injury of skin  Acute on chronic combined systolic/diastolic HF s/p AICD Significant bilateral lower extremity edema/overloaded BNP 2328 Chest x-ray with hazy  interstitial opacities consistent with interstitial edema Echo done on 01/2017 showed EF of 15 to 54%, grade 2 diastolic dysfunction Cardiology/HF consulted: Continue digoxin, Lasix 80 IV twice daily, start milrinone drip Strict I's and O's, daily weight, CVP monitoring, cooxemetry panel Telemetry monitoring  Generalized weakness/deconditioned Likely due to above Used to go for cardiac rehab for have not been since the Covid pandemic CK level, B12, TSH, all within normal limits CT head negative for acute findings PT/OT-recommend CIR  Bilateral lower extremity cellulitis Currently afebrile, chronic leukocytosis Multiple excoriations with serous weeping noted Continue p.o. Keflex  Mild AKI on CKD stage Leonard [name] Improving with diuresis New baseline creatinine since this year around 1.67 Monitor closely with diuresis  Hyperkalemia Resolved S/p Lokelma  Hypotension Patient's BP normally runs on the low side (poor cardiac reserves) Monitor closely  Elevated TSH TSH 9, free T4 1.09 Outpatient follow-up  CAD Currently chest pain-free Continue ASA, Brilinta, statin  Alcohol abuse Patient has 5-7 drinks every night for many years Patient counseled, advised to quit Continue CIWA protocol  [facility] Palliative consulted       Malnutrition Type:      Malnutrition Characteristics:      Nutrition Interventions:       Estimated body mass index is 37.93 kg/m as calculated from the following:   Height as of this encounter: 6' 1.5" (1.867 m).   Weight as of this encounter: 132.2 kg.     Code Status: Full  Family Communication: None at bedside  Disposition Plan: CIR   Consultants:  Cardiology  Procedures:  None  Antimicrobials:  Keflex  DVT prophylaxis: Heparin [place]   Objective: Vitals:   12/19/18 1057 12/19/18 1220 12/19/18 1229 12/19/18 1240  BP: (!) 87/64 99/75 98/68    Pulse:   [name])  104 100  Resp: (!) 25 20 20    Temp:      TempSrc:      SpO2:  100%  96%   Weight:      Height:        Intake/Output Summary (Last 24 hours) at 12/19/2018 1417 Last data filed at 12/19/2018 1248 Gross per 24 hour  Intake 460 ml  Output 2275 ml  Net -1815 ml   Filed Weights   12/17/18 1817 12/18/18 0651 12/19/18 0656  Weight: 132 kg 130.9 kg 132.2 kg    Exam:  General: NAD, lethargic, chronically ill-appearing  Cardiovascular: S1, S2 present  Respiratory:  Diminished breath sounds bilaterally  Abdomen: Soft, nontender, nondistended, bowel sounds present  Musculoskeletal: 3+ bilateral pedal edema noted  Skin:  Multiple excoriations with serous weeping noted bilaterally now with Ace wrap  Psychiatry: Normal mood    Data Reviewed: CBC: Recent Labs  Lab 12/17/18 1822 12/18/18 0456 12/19/18 0500  WBC 14.8* 14.8* 13.7*  NEUTROABS  --   --  11.0*  HGB 16.5 15.6 14.0  HCT 51.2 49.8 43.2  MCV 89.5 90.7 88.0  PLT 334 335 010   Basic Metabolic Panel: Recent Labs  Lab 12/13/18 0938 12/17/18 1822 12/18/18 0456 12/19/18 0500  NA 134 133* 133* 132*  K 4.6 5.3* 5.2* 3.7  CL 91* 91* 90* 91*  CO2 25 26 26 26   GLUCOSE 156* 141* 103* 94  BUN 38* 41* 42* 37*  CREATININE 1.67* 1.82* 1.86* 1.77*  CALCIUM 9.3 9.5 9.5 8.7*  MG  --   --  2.3  --   PHOS  --   --  4.6  --    GFR: Estimated Creatinine Clearance: 51.8 mL/min (A) (by C-G formula based on SCr of 1.77 mg/dL (H)). Liver Function Tests: Recent Labs  Lab 12/18/18 0444  AST 22  ALT 13  ALKPHOS 111  BILITOT 2.0*  PROT 6.4*  ALBUMIN 3.1*   No results for input(s): LIPASE, AMYLASE in the last 168 hours. No results for input(s): AMMONIA in the last 168 hours. Coagulation Profile: No results for input(s): INR, PROTIME in the last 168 hours. Cardiac Enzymes: Recent Labs  Lab 12/18/18 0444  CKTOTAL 119   BNP (last 3 results) No results for input(s): PROBNP in the last 8760 hours. HbA1C: No results for input(s): HGBA1C in the last 72 hours. CBG: No results for  input(s): GLUCAP in the last 168 hours. Lipid Profile: No results for input(s): CHOL, HDL, LDLCALC, TRIG, CHOLHDL, LDLDIRECT in the last 72 hours. Thyroid Function Tests: Recent Labs    12/18/18 0445 12/18/18 0536  TSH 9.093*  --   FREET4  --  1.09   Anemia Panel: Recent Labs    12/18/18 0444  VITAMINB12 580   Urine analysis:    Component Value Date/Time   COLORURINE YELLOW 12/17/2018 2021   APPEARANCEUR CLEAR 12/17/2018 2021   LABSPEC 1.013 12/17/2018 2021   PHURINE 6.0 12/17/2018 2021   GLUCOSEU NEGATIVE 12/17/2018 2021   HGBUR SMALL (A) 12/17/2018 2021   BILIRUBINUR NEGATIVE 12/17/2018 2021   BILIRUBINUR n 04/18/2015 1106   [place] 12/17/2018 2021   PROTEINUR NEGATIVE 12/17/2018 2021   UROBILINOGEN negative 04/18/2015 1106   NITRITE NEGATIVE 12/17/2018 2021   LEUKOCYTESUR NEGATIVE 12/17/2018 2021   Sepsis Labs: @LABRCNTIP (procalcitonin:4,lacticidven:4)  ) Recent Results (from the past 240 hour(s))  SARS CORONAVIRUS 2 (TAT 6-24 HRS) Nasopharyngeal Nasopharyngeal Swab     Status: None   Collection Time: 12/17/18  9:40  PM   Specimen: Nasopharyngeal Swab  Result Value Ref Range Status   SARS Coronavirus 2 NEGATIVE NEGATIVE Final    Comment: (NOTE) SARS-CoV-2 target nucleic acids are NOT DETECTED. The SARS-CoV-2 RNA is generally detectable in upper and lower respiratory specimens during the acute phase of infection. Negative results do not preclude SARS-CoV-2 infection, do not rule out co-infections with other pathogens, and should not be used as the sole basis for treatment or other patient management decisions. Negative results must be combined with clinical observations, patient history, and epidemiological information. The expected result is Negative. Fact Sheet for Patients: SugarRoll.be Fact Sheet for Healthcare Providers: https://www.woods-mathews.com/ This test is not yet approved or cleared by the  [place] FDA and  has been authorized for detection and/or diagnosis of SARS-CoV-2 by FDA under an Emergency Use Authorization (EUA). This EUA will remain  in effect (meaning this test can be used) for the duration of the COVID-19 declaration under Section 56 4(b)(1) of the Act, 21 U.S.C. section 360bbb-3(b)(1), unless the authorization is terminated or revoked sooner. Performed at [facility], [place] [address], [place], [place] [address]   MRSA PCR Screening     Status: None   Collection Time: 12/18/18  9:07 AM   Specimen: Nasal Mucosa; Nasopharyngeal  Result Value Ref Range Status   MRSA by PCR NEGATIVE NEGATIVE Final    Comment:        The GeneXpert MRSA Assay (FDA approved for NASAL specimens only), is one component of a comprehensive MRSA colonization surveillance program. It is not intended to diagnose MRSA infection nor to guide or monitor treatment for MRSA infections. Performed at [facility], [place] [address] [place], [place] [address]       Studies: No results found.  Scheduled Meds: . aspirin EC  81 mg Oral Daily  . atorvastatin  80 mg Oral q1800  . cephALEXin  500 mg Oral Q12H  . Chlorhexidine Gluconate Cloth  6 each Topical Daily  . digoxin  0.125 mg Oral q1800  . folic acid  1 mg Oral Daily  . furosemide  80 mg Intravenous BID  . heparin  5,000 Units Subcutaneous Q8H  . LORazepam  0-4 mg Oral Q6H   Followed by  . LORazepam  0-4 mg Oral Q12H  . multivitamin with minerals  1 tablet Oral Daily  . sodium chloride flush  10-40 mL Intracatheter Q12H  . sodium chloride flush  3 mL Intravenous Q12H  . sodium chloride flush  3 mL Intravenous Q12H  . thiamine  100 mg Oral Daily   Or  . thiamine  100 mg Intravenous Daily  . ticagrelor  90 mg Oral BID  . venlafaxine  75 mg Oral Daily    Continuous Infusions: . sodium chloride    . sodium chloride    . milrinone 0.125 mcg/kg/min (12/19/18 1015)     LOS: 1 day     Alma Friendly, MD [facility]  If 7PM-7AM, please contact night-coverage [email or web address] 12/19/2018, 2:17 PM

## 2018-12-19 NOTE — Consult Note (Signed)
WOC Nurse Consult Note: UNNA boots placed and will be changed weekly on Friday.  Will need MD direction after discharge.  Can he transition to removable compression garment?   Reason for Consult:Moisture associated skin damage to inguinal folds.  This was an issue prior to admission and wife was applying Lotrimin cream.  She admits she was not consistent.  He has condom catheter in place and I will implement a skin fold management textile.  Wound type:MASD Pressure Injury POA: /NA Measurement:Erythema and serosanguinous oozing from inguinal and perineal areas.  Wound HOZ:YYQM and moist  Partial thickness Drainage (amount, consistency, odor) Moderate moisture  With condom cath in place.  Periwound:intact Dressing procedure/placement/frequency:   COntinue condom catheter Cleanse inguinal folds with soap and water and pat dry.   Measure and cut length of InterDry to fit in skin folds that have skin breakdown Tuck InterDry fabric into skin folds in a single layer, allow for 2 inches of overhang from skin edges to allow for wicking to occur May remove to bathe; dry area thoroughly and then tuck into affected areas again Do not apply any creams or ointments when using InterDry DO NOT THROW AWAY FOR 5 DAYS unless soiled with stool DO NOT [facility] product, this will inactivate the silver in the material  New sheet of Interdry should be applied after 5 days of use if patient continues to have skin breakdown   Will not follow at this time.  Please re-consult if needed.  Domenic Moras MSN, RN, FNP-BC CWON Wound, Ostomy, Continence Nurse Pager [phone]

## 2018-12-19 NOTE — Telephone Encounter (Signed)
New Message:       Wife wants Dr Ellyn Hack to know that the pt is in [facility].

## 2018-12-19 NOTE — Telephone Encounter (Signed)
Will route to make MD aware.

## 2018-12-19 NOTE — Telephone Encounter (Signed)
Thank you :)

## 2018-12-20 ENCOUNTER — Telehealth (HOSPITAL_COMMUNITY): Payer: Self-pay

## 2018-12-20 ENCOUNTER — Ambulatory Visit: Payer: Medicare Other | Admitting: Adult Health

## 2018-12-20 DIAGNOSIS — Z7189 Other specified counseling: Secondary | ICD-10-CM

## 2018-12-20 DIAGNOSIS — I5043 Acute on chronic combined systolic (congestive) and diastolic (congestive) heart failure: Secondary | ICD-10-CM | POA: Diagnosis not present

## 2018-12-20 DIAGNOSIS — Z515 Encounter for palliative care: Secondary | ICD-10-CM

## 2018-12-20 LAB — CBC WITH DIFFERENTIAL/PLATELET
Abs Immature Granulocytes: 0.04 10*3/uL (ref 0.00–0.07)
Basophils Absolute: 0 10*3/uL (ref 0.0–0.1)
Basophils Relative: 0 %
Eosinophils Absolute: 0.2 10*3/uL (ref 0.0–0.5)
Eosinophils Relative: 1 %
HCT: 45.7 % (ref 39.0–52.0)
Hemoglobin: 14.7 g/dL (ref 13.0–17.0)
Immature Granulocytes: 0 %
Lymphocytes Relative: 11 %
Lymphs Abs: 1.5 10*3/uL (ref 0.7–4.0)
MCH: 28.3 pg (ref 26.0–34.0)
MCHC: 32.2 g/dL (ref 30.0–36.0)
MCV: 88.1 fL (ref 80.0–100.0)
Monocytes Absolute: 0.9 10*3/uL (ref 0.1–1.0)
Monocytes Relative: 7 %
Neutro Abs: 10.1 10*3/uL — ABNORMAL HIGH (ref 1.7–7.7)
Neutrophils Relative %: 81 %
Platelets: 272 10*3/uL (ref 150–400)
RBC: 5.19 MIL/uL (ref 4.22–5.81)
RDW: 15.5 % (ref 11.5–15.5)
WBC: 12.7 10*3/uL — ABNORMAL HIGH (ref 4.0–10.5)
nRBC: 0 % (ref 0.0–0.2)

## 2018-12-20 LAB — BASIC METABOLIC PANEL
Anion gap: 14 (ref 5–15)
BUN: 34 mg/dL — ABNORMAL HIGH (ref 8–23)
CO2: 28 mmol/L (ref 22–32)
Calcium: 8.9 mg/dL (ref 8.9–10.3)
Chloride: 90 mmol/L — ABNORMAL LOW (ref 98–111)
Creatinine, Ser: 1.73 mg/dL — ABNORMAL HIGH (ref 0.61–1.24)
GFR calc Af Amer: 44 mL/min — ABNORMAL LOW (ref >60)
GFR calc non Af Amer: 38 mL/min — ABNORMAL LOW (ref >60)
Glucose, Bld: 127 mg/dL — ABNORMAL HIGH (ref 70–99)
Potassium: 3.4 mmol/L — ABNORMAL LOW (ref 3.5–5.1)
Sodium: 132 mmol/L — ABNORMAL LOW (ref 135–145)

## 2018-12-20 LAB — COOXEMETRY PANEL
Carboxyhemoglobin: 1.8 % — ABNORMAL HIGH (ref 0.5–1.5)
Methemoglobin: 0.9 % (ref 0.0–1.5)
O2 Saturation: 58.5 %
Total hemoglobin: 15.2 g/dL (ref 12.0–16.0)

## 2018-12-20 MED ORDER — SACUBITRIL-VALSARTAN 24-26 MG PO TABS
1.0000 | ORAL_TABLET | Freq: Two times a day (BID) | ORAL | Status: DC
  Filled 2018-12-20: qty 1

## 2018-12-20 MED ORDER — POTASSIUM CHLORIDE CRYS ER 20 MEQ PO TBCR
40.0000 meq | EXTENDED_RELEASE_TABLET | Freq: Once | ORAL | Status: AC
  Administered 2018-12-20: 09:00:00 40 meq via ORAL
  Filled 2018-12-20: qty 2

## 2018-12-20 MED ORDER — TORSEMIDE 20 MG PO TABS
40.0000 mg | ORAL_TABLET | Freq: Two times a day (BID) | ORAL | Status: DC
  Administered 2018-12-20: 40 mg via ORAL
  Filled 2018-12-20 (×2): qty 2

## 2018-12-20 MED ORDER — SACUBITRIL-VALSARTAN 24-26 MG PO TABS: 1.0000 | ORAL_TABLET | Freq: Two times a day (BID) | ORAL | Status: DC

## 2018-12-20 NOTE — Significant Event (Addendum)
Rapid Response Event Note  Overview: Hypotension  Initial Focused Assessment: Called by nurse with concerns of patient having transient low blood pressures this afternoon. Nurse had already contacted [facility] provider. Upon arrival, patient was alert and oriented, he endorsed being tired overall but he tells me that he got no sleep last night. HR 100-120s - appears irregular, SBP 79-100s with MAPs 60-70s. 96% on [place] 2L. RR normal effort, denies CP/SOB, denies any symptoms, skin warm and dry, trace edema in extremities. Per nurse and patient leg edema has improved significantly. CVP 3. + UOP   Interventions: -- No RRT Interventions   Plan of Care: -- Monitor VS, particularly BP, check prior to and after giving medications that affect BP.  -- Perhaps hold 1800 dose of Torsemide if okay with provider - patient has voided 1500cc today per nurse. I did not see in that flowsheet, nurse to updated I/O - strict I/O -- Rest per medicine/cardiology services  Event Summary:  Call Time 1639 Arrival Time 1641 End Time 1720   Christella App R

## 2018-12-20 NOTE — Progress Notes (Signed)
Pt SPO2 decreased to 82.  Started pt on 2 liters O2 via nasal canula.

## 2018-12-20 NOTE — Progress Notes (Signed)
Patient assume responsibility for wallet and cell phone at bedside. patient stated he will give the wallet to his wife  Tomorrow.

## 2018-12-20 NOTE — Progress Notes (Signed)
Patient SBP fluctuate from lower 100 to low 80, patient asymptomatic denies pain, dizziness and and oriented easily falls back to sleep and easily arousable. Brittney HF PA notified, PA instruct to continue to monitor the patient  .

## 2018-12-20 NOTE — Telephone Encounter (Signed)
Returned call to patients wife as she is inquiring about patients status while in patient. Advised will forward to NP/PA to reach out to her with information about patient.

## 2018-12-20 NOTE — Progress Notes (Addendum)
Advanced Heart Failure Rounding Note  PCP-Cardiologist: Glenetta Hew, MD   Subjective:    Yesterday milrinone 0.125 mcg added due to low CO-OX. Todays CO-OX 58.5%. Brisk diuresis noted with IV lasix +metolazone.  Weight down 291-->272 pounds.   Feeling better today. Denies SOB,   Objective:   Weight Range: 123.6 kg Body mass index is 35.46 kg/m.   Vital Signs:   Temp:  [98.1 F (36.7 C)-98.5 F (36.9 C)] 98.2 F (36.8 C) (12/01 0729) Pulse Rate:  [100-114] 114 (12/01 0729) Resp:  [13-25] 18 (12/01 0729) BP: (87-110)/(62-92) 105/78 (12/01 0729) SpO2:  [93 %-100 %] 93 % (12/01 0729) Weight:  [123.6 kg] 123.6 kg (12/01 0649) Last BM Date: 12/19/18  Weight change: Filed Weights   12/18/18 0651 12/19/18 0656 12/20/18 0649  Weight: 130.9 kg 132.2 kg 123.6 kg    Intake/Output:   Intake/Output Summary (Last 24 hours) at 12/20/2018 0737 Last data filed at 12/20/2018 2952 Gross per 24 hour  Intake 1143.02 ml  Output 4800 ml  Net -3656.98 ml      Physical Exam   CVP 5 personally checked.  General:  Obese male lying in bed . No resp difficulty HEENT: normal Neck: supple. JVP 5-6. Carotids 2+ bilat; no bruits. No lymphadenopathy or thryomegaly appreciated. Cor: PMI nondisplaced. Tachy irregular  rate & rhythm. No rubs, or murmurs. +S3  Lungs: clear no wheeze Abdomen: obese soft, nontender, nondistended. No hepatosplenomegaly. No bruits or masses. Good bowel sounds. Extremities: no cyanosis, clubbing, rash, R and LLE unna boots. 1+ edema  RUE PICC Neuro: alert & oriented x 3, cranial nerves grossly intact. moves all 4 extremities w/o difficulty. Affect pleasant   Telemetry   Sinus Tach 105-115 with occasional PVCs.    EKG    No new EKG to review  Labs    CBC Recent Labs    12/19/18 0500 12/20/18 0434  WBC 13.7* 12.7*  NEUTROABS 11.0* 10.1*  HGB 14.0 14.7  HCT 43.2 45.7  MCV 88.0 88.1  PLT 258 841   Basic Metabolic Panel Recent Labs    12/18/18  0456 12/19/18 0500 12/20/18 0434  NA 133* 132* 132*  K 5.2* 3.7 3.4*  CL 90* 91* 90*  CO2 26 26 28   GLUCOSE 103* 94 127*  BUN 42* 37* 34*  CREATININE 1.86* 1.77* 1.73*  CALCIUM 9.5 8.7* 8.9  MG 2.3  --   --   PHOS 4.6  --   --    Liver Function Tests Recent Labs    12/18/18 0444  AST 22  ALT 13  ALKPHOS 111  BILITOT 2.0*  PROT 6.4*  ALBUMIN 3.1*   No results for input(s): LIPASE, AMYLASE in the last 72 hours. Cardiac Enzymes Recent Labs    12/18/18 0444  CKTOTAL 119    BNP: BNP (last 3 results) Recent Labs    12/18/18 0456  BNP 2,328.4*    ProBNP (last 3 results) No results for input(s): PROBNP in the last 8760 hours.   D-Dimer No results for input(s): DDIMER in the last 72 hours. Hemoglobin A1C No results for input(s): HGBA1C in the last 72 hours. Fasting Lipid Panel No results for input(s): CHOL, HDL, LDLCALC, TRIG, CHOLHDL, LDLDIRECT in the last 72 hours. Thyroid Function Tests Recent Labs    12/18/18 0445  TSH 9.093*    Other results:   Imaging    No results found.   Medications:     Scheduled Medications: . aspirin EC  81 mg  Oral Daily  . atorvastatin  80 mg Oral q1800  . cephALEXin  500 mg Oral Q12H  . Chlorhexidine Gluconate Cloth  6 each Topical Daily  . digoxin  0.125 mg Oral q1800  . folic acid  1 mg Oral Daily  . furosemide  80 mg Intravenous BID  . heparin  5,000 Units Subcutaneous Q8H  . LORazepam  0-4 mg Oral Q12H  . multivitamin with minerals  1 tablet Oral Daily  . potassium chloride  40 mEq Oral Once  . sodium chloride flush  10-40 mL Intracatheter Q12H  . sodium chloride flush  3 mL Intravenous Q12H  . sodium chloride flush  3 mL Intravenous Q12H  . thiamine  100 mg Oral Daily   Or  . thiamine  100 mg Intravenous Daily  . ticagrelor  90 mg Oral BID  . venlafaxine  75 mg Oral Daily    Infusions: . sodium chloride    . sodium chloride    . milrinone 0.125 mcg/kg/min (12/20/18 0653)    PRN Medications:  sodium chloride, sodium chloride, acetaminophen, LORazepam **OR** LORazepam, ondansetron (ZOFRAN) IV, sodium chloride flush, sodium chloride flush, sodium chloride flush    Patient Profile   73 y/o male with multiple medical issues including ischemic CM with severe LV dysfunction with EF 15-20%. Has had severe HF for several years with low output on RHC in 2018. Admitted for a/c systolic HF w/ marked volume overload and bilateral LE cellulitis.   Assessment/Plan    1. 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  Admitted w/ NYHA IIIb symptoms and massive volume overload. BNP 2328.  ECHO this admit EF 10-15% RV moderately entarged. RA/LA severely dilated.  Has [employer] Jude ICD . Has LBBB will ask EP consider upgrade. CRT-D  Milrinone started due to low CO-OX and to assist with diuresis.  Todays CO-OX improved, 58.5% on milrinone 0.125 mcg.  - CVP down to 5. Stop IV lasix. Start torsemide 40 mg twice a day.  K 3.4 . Supp K.   - Continue digoxin - Will restart low-dose Entresto 24/26. Watch BP closely  - No  blocker due to low output - No SGLt2 with chronic rash under his pannus.  - Continue bilateral unna boots for edema   2. AKI: baseline SCr ~1.3. SCr 1.8 on admit in the setting of a/c CHF w/ volume overload. 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 SCr improving w/ diuresis, down from 1.86>>1.77>>1.73 -  Monitor renal function closely   3. Bilateral LE Cellulitis:  - on Keflex 500 bid per IM  - WBC trending down. Remains afebrile   4. Hyponatremia: - Sodium 132 -Watch closely.  - likely 2/2 hypervolemia.   5. CAD: hx of STEMI 2017 s/p DES LAD, non-obs dz at cath 2018 - no s/s of angina  - Continue medical therapy: ASA, Brilinta and statin. No  blocker due to low output.   6. LBBB  Back in 2019 Dr Caryl Comes had planned to upgrade to CRT-D. We will need to follow up with EP.  QRS 178 ms   7. Deconditioning CIR consulted.   Length of Stay: 2  Darrick Grinder, NP  12/20/2018, 7:37 AM  Advanced Heart Failure Team Pager [phone] (M-F; 7a - 4p)  Please contact [facility] for night-coverage after hours (4p -7a ) and weekends on [email or web address]   Patient seen and examined with the above-signed Advanced Practice Provider and/or Housestaff. I personally reviewed laboratory data, imaging studies and relevant notes. I  independently examined the patient and formulated the important aspects of the plan. I have edited the note to reflect any of my changes or salient points. I have personally discussed the plan with the patient and/or family.  Weight down 19 pounds in 2 days. Co-ox modestly improved with milrinone. CVP down to 5-6. Creatinine stable at 1.7. K low. Will continue milrinone. Switch IV lasix to po torsemide. Restart low-dose Entresto but will need to watch BO closely.   Consult CIR. Will eventually need CRT upgrade. Hopefully in the near future as outpatient.   Glori Bickers, MD  9:25 AM

## 2018-12-20 NOTE — Progress Notes (Signed)
Patient SBP still soft  See epic, patient asymptomatic cardiology PA notified also PA instruct to hold the evening Torsemide and continue to monitor the patient.

## 2018-12-20 NOTE — Progress Notes (Signed)
Inpatient Rehab Admissions Coordinator:   Met with pt to discuss regress with therapy and he is now agreeable to CIR.  Will call wife to confirm discharge support, and plan for possible admission when medically ready.    Shann Medal, PT, DPT Admissions Coordinator [phone] 12/20/18  12:46 PM

## 2018-12-20 NOTE — Progress Notes (Signed)
PROGRESS NOTE  Leonard [name] MGN:[id number] DOB: 12/02/1945 DOA: 12/17/2018 PCP: Leonard Rm, NP-C  HPI/Recap of past 24 hours: HPI from Dr Leonard [name] is a 73 y.o. male with medical history significant for coronary artery disease, ischemic cardiomyopathy with EF 15 to 20%, chronic kidney disease stage [name], and excessive daily alcohol use, now presenting to the ED for evaluation of generalized weakness/unable to ambulate. Pt was seen in cardiology clinic on 12/05/18, appeared hypervolemic, aldactone was increased and Lasix switched to torsemide. Pt denied any shortness of breath, but reports significant serous weeping from his edematous lower extremities, which was suspected to have a superimposed cellulitis, and he was started on Keflex earlier this month.  In the ED, patient afebrile, systolic BP in the 91Q, head CT is negative for acute intracranial hemorrhage. Chest x-ray is notable for hazy interstitial opacities consistent with interstitial edema.  Labs showed mild hyponatremia, mild AKI with creatinine up to 1.82 from 1.67 earlier this month, CBC with chronic leukocytosis, COVID-19 screening negative.  Patient admitted for further management   Today, patient denies any new complaints, appears lethargic, but easily arousable oriented.  Denies any worsening shortness of breath, chest pain, abdominal pain, nausea/vomiting.    Assessment/Plan: Principal Problem:   Generalized weakness Active Problems:   Essential hypertension   CKD (chronic kidney disease), stage [name]   Ischemic cardiomyopathy   Chronic combined systolic and diastolic CHF (congestive heart failure) (HCC)   Coronary artery disease involving native coronary artery with angina pectoris (HCC)   Excessive drinking of alcohol   Lower extremity cellulitis   Pressure injury of skin   Palliative care by specialist   DNR (do not resuscitate) discussion  Acute on chronic combined systolic/diastolic HF s/p  AICD Significant bilateral lower extremity edema/overloaded BNP 2328 Chest x-ray with hazy interstitial opacities consistent with interstitial edema Echo done on 01/2017 showed EF of 15 to 94%, grade 2 diastolic dysfunction Cardiology/HF consulted, appreciate recs, currently on milrinone drip, torsemide with effective diuresis.  Plan to restart low-dose Entresto Strict I's and O's, daily weight, CVP monitoring, cooxemetry panel Patient may eventually need CRRT, will need to follow-up with EP Telemetry monitoring  Generalized weakness/deconditioned Likely due to above Used to go for cardiac rehab for have not been since the Covid pandemic CK level, B12, TSH, all within normal limits CT head negative for acute findings PT/OT-recommend CIR  Bilateral lower extremity cellulitis Currently afebrile, chronic leukocytosis Multiple excoriations with serous weeping noted Continue p.o. Keflex  Mild AKI on CKD stage [name] Improving with diuresis New baseline creatinine since this year around 1.67 Monitor closely with diuresis  Hyperkalemia Resolved S/p Lok[place]ma  Hypotension Patient's BP normally runs on the low side (poor cardiac reserves) Monitor closely  Elevated TSH TSH 9, free T4 1.09 Outpatient follow-up  CAD Currently chest pain-free Continue ASA, Brilinta, statin  Alcohol abuse Patient has 5-7 drinks every night for many years Patient counseled, advised to quit Continue CIWA protocol  [facility] Palliative consulted       Malnutrition Type:      Malnutrition Characteristics:      Nutrition Interventions:       Estimated body mass index is 35.46 kg/m as calculated from the following:   Height as of this encounter: 6' 1.5" (1.867 m).   Weight as of this encounter: 123.6 kg.     Code Status: Full  Family Communication: None at bedside  Disposition Plan: CIR   Consultants:  Cardiology  Palliative  Procedures:  None  Antimicrobials:  Keflex   DVT prophylaxis: Heparin Amasa   Objective: Vitals:   12/20/18 1706 12/20/18 1710 12/20/18 1715 12/20/18 1734  BP: (!) 82/60 (!) 79/63 (!) 74/55 (!) 87/60  Pulse: (!) 58 93 (!) 103 75  Resp:      Temp:      TempSrc:      SpO2:      Weight:      Height:        Intake/Output Summary (Last 24 hours) at 12/20/2018 1752 Last data filed at 12/20/2018 1308 Gross per 24 hour  Intake 1145.52 ml  Output 4250 ml  Net -3104.48 ml   Filed Weights   12/18/18 0651 12/19/18 0656 12/20/18 0649  Weight: 130.9 kg 132.2 kg 123.6 kg    Exam:  General: NAD, chronically ill-appearing, lethargic  Cardiovascular: S1, S2 present  Respiratory:  Diminished breath sounds bilaterally, with bilateral crackles noted  Abdomen: Soft, nontender, nondistended, bowel sounds present  Musculoskeletal: 3+ bilateral pedal edema noted  Skin:  Bilateral Ace wraps noted, groin excoriations  Psychiatry: Normal mood    Data Reviewed: CBC: Recent Labs  Lab 12/17/18 1822 12/18/18 0445 12/18/18 0456 12/19/18 0500 12/20/18 0434  WBC 14.8*  --  14.8* 13.7* 12.7*  NEUTROABS  --   --   --  11.0* 10.1*  HGB 16.5  --  15.6 14.0 14.7  HCT 51.2 48.3 49.8 43.2 45.7  MCV 89.5  --  90.7 88.0 88.1  PLT 334  --  335 258 093   Basic Metabolic Panel: Recent Labs  Lab 12/17/18 1822 12/18/18 0456 12/19/18 0500 12/20/18 0434  NA 133* 133* 132* 132*  K 5.3* 5.2* 3.7 3.4*  CL 91* 90* 91* 90*  CO2 26 26 26 28   GLUCOSE 141* 103* 94 127*  BUN 41* 42* 37* 34*  CREATININE 1.82* 1.86* 1.77* 1.73*  CALCIUM 9.5 9.5 8.7* 8.9  MG  --  2.3  --   --   PHOS  --  4.6  --   --    GFR: Estimated Creatinine Clearance: 51.2 mL/min (A) (by C-G formula based on SCr of 1.73 mg/dL (H)). Liver Function Tests: Recent Labs  Lab 12/18/18 0444  AST 22  ALT 13  ALKPHOS 111  BILITOT 2.0*  PROT 6.4*  ALBUMIN 3.1*   No results for input(s): LIPASE, AMYLASE in the last 168 hours. No results for input(s): AMMONIA in the last  168 hours. Coagulation Profile: No results for input(s): INR, PROTIME in the last 168 hours. Cardiac Enzymes: Recent Labs  Lab 12/18/18 0444  CKTOTAL 119   BNP (last 3 results) No results for input(s): PROBNP in the last 8760 hours. HbA1C: No results for input(s): HGBA1C in the last 72 hours. CBG: No results for input(s): GLUCAP in the last 168 hours. Lipid Profile: No results for input(s): CHOL, HDL, LDLCALC, TRIG, CHOLHDL, LDLDIRECT in the last 72 hours. Thyroid Function Tests: Recent Labs    12/18/18 0445 12/18/18 0536  TSH 9.093*  --   FREET4  --  1.09   Anemia Panel: Recent Labs    12/18/18 0444  VITAMINB12 580   Urine analysis:    Component Value Date/Time   COLORURINE YELLOW 12/17/2018 2021   APPEARANCEUR CLEAR 12/17/2018 2021   LABSPEC 1.013 12/17/2018 2021   PHURINE 6.0 12/17/2018 2021   GLUCOSEU NEGATIVE 12/17/2018 2021   HGBUR SMALL (A) 12/17/2018 2021   BILIRUBINUR NEGATIVE 12/17/2018 2021   BILIRUBINUR n 04/18/2015 1106  [place] NEGATIVE 12/17/2018 [place] NEGATIVE 12/17/2018 2021   UROBILINOGEN negative 04/18/2015 1106   NITRITE NEGATIVE 12/17/2018 2021   LEUKOCYTESUR NEGATIVE 12/17/2018 2021   Sepsis Labs: @LABRCNTIP (procalcitonin:4,lacticidven:4)  ) Recent Results (from the past 240 hour(s))  SARS CORONAVIRUS 2 (TAT 6-24 HRS) Nasopharyngeal Nasopharyngeal Swab     Status: None   Collection Time: 12/17/18  9:40 PM   Specimen: Nasopharyngeal Swab  Result Value Ref Range Status   SARS Coronavirus 2 NEGATIVE NEGATIVE Final    Comment: (NOTE) SARS-CoV-2 target nucleic acids are NOT DETECTED. The SARS-CoV-2 RNA is generally detectable in upper and lower respiratory specimens during the acute phase of infection. Negative results do not preclude SARS-CoV-2 infection, do not rule out co-infections with other pathogens, and should not be used as the sole basis for treatment or other patient management decisions. Negative results must  be combined with clinical observations, patient history, and epidemiological information. The expected result is Negative. Fact Sheet for Patients: SugarRoll.be Fact Sheet for Healthcare Providers: https://www.woods-mathews.com/ This test is not yet approved or cleared by the [place] FDA and  has been authorized for detection and/or diagnosis of SARS-CoV-2 by FDA under an Emergency Use Authorization (EUA). This EUA will remain  in effect (meaning this test can be used) for the duration of the COVID-19 declaration under Section 56 4(b)(1) of the Act, 21 U.S.C. section 360bbb-3(b)(1), unless the authorization is terminated or revoked sooner. Performed at [facility], [place] [address], [place], [place] [address]   MRSA PCR Screening     Status: None   Collection Time: 12/18/18  9:07 AM   Specimen: Nasal Mucosa; Nasopharyngeal  Result Value Ref Range Status   MRSA by PCR NEGATIVE NEGATIVE Final    Comment:        The GeneXpert MRSA Assay (FDA approved for NASAL specimens only), is one component of a comprehensive MRSA colonization surveillance program. It is not intended to diagnose MRSA infection nor to guide or monitor treatment for MRSA infections. Performed at [facility], [place] [address], [place], [place] [address]       Studies: No results found.  Scheduled Meds: . aspirin EC  81 mg Oral Daily  . atorvastatin  80 mg Oral q1800  . cephALEXin  500 mg Oral Q12H  . Chlorhexidine Gluconate Cloth  6 each Topical Daily  . digoxin  0.125 mg Oral q1800  . folic acid  1 mg Oral Daily  . heparin  5,000 Units Subcutaneous Q8H  . LORazepam  0-4 mg Oral Q12H  . multivitamin with minerals  1 tablet Oral Daily  . sacubitril-valsartan  1 tablet Oral BID  . sodium chloride flush  10-40 mL Intracatheter Q12H  . sodium chloride flush  3 mL Intravenous Q12H  . sodium chloride flush  3 mL Intravenous Q12H  . thiamine  100 mg  Oral Daily   Or  . thiamine  100 mg Intravenous Daily  . ticagrelor  90 mg Oral BID  . torsemide  40 mg Oral BID  . venlafaxine  75 mg Oral Daily    Continuous Infusions: . sodium chloride    . sodium chloride    . milrinone 0.125 mcg/kg/min (12/20/18 0370)     LOS: 2 days     Alma Friendly, MD [facility]  If 7PM-7AM, please contact night-coverage [email or web address] 12/20/2018, 5:52 PM

## 2018-12-20 NOTE — Consult Note (Signed)
Consultation Note Date: 12/20/2018   Patient Name: Leonard [name]  DOB: 12/26/1945  MRN: [id number]  Age / Sex: 73 y.o., male  PCP: Girtha Rm, NP-C Referring Physician: Alma Friendly, MD  Reason for Consultation: Establishing goals of care and Psychosocial/spiritual support  HPI/Patient Profile: 73 y.o. male   admitted on 12/17/2018 with past  medical history significant for coronary artery disease, ischemic cardiomyopathy with EF 15 to 20%, chronic kidney disease stage [name], and excessive daily alcohol use, now presenting to the emergency department for evaluation of generalized weakness.    Patient has been using a cane and wheelchair, was too weak to stand up when he was in the cardiology clinic on 12/05/2018, as reportedly been experiencing progressive generalized weakness.  He appeared hypervolemic in the cardiology clinic on 12/05/2018, Aldactone was increased and Lasix switched to torsemide, and they discussed ICD upgrade to CRT device.   Wife reports continued slow physical and functional decline.  He has serous weeping from bilateral edematous lower extremities.  She reports that he drinks 4-5 scotch drinks a night.  And she worries that he is "in denial" to the seriousness of his medical situation.  Patient with serious life limiting comorbidities.  Cute on chronic biventricular heart failure, currently on milrinone drip.  Acute on chronic kidney disease creatinine today is 1.78, lateral cellulitis and overall failure to thrive.   Patient and family face treatment option decisions, advanced directive decisions, anticipatory care needs.   Clinical Assessment and Goals of Care:   This NP Wadie Lessen reviewed medical records, received report from team, assessed the patient and then meet at the patient's bedside  to discuss diagnosis, prognosis, GOC, EOL wishes disposition and options.       Concept of Palliative Care was discussed  A  discussion was had today regarding advanced directives.  Concepts specific to code status, artifical feeding and hydration, continued IV antibiotics and rehospitalization was had.  The difference between a aggressive medical intervention path  and a palliative comfort care path for this patient at this time was had.  Values and goals of care important to patient and family were attempted to be elicited.  I then spoke to his wife by phone and had a conversation regarding the above concepts.  Create space  and opportunity for patient and his wife to explore their thoughts and feelings regarding current medical situation.  Patient verbalizes a sense of sadness as he realizes his loss of independence and realization of his human mortality.    Questions and concerns addressed.   Family encouraged to call with questions or concerns.    PMT will continue to support holistically.   Meeting is scheduled for tomorrow at noon to meet again with wife at bedside for further conversation    Family to look for documents and bring to hospital for scanning  Brynda Rim is his elder attorney     SUMMARY OF RECOMMENDATIONS    Code Status/Advance Care Planning:  Full code  Palliative Prophylaxis:  Delirium Protocol and Frequent Pain Assessment  Additional Recommendations (Limitations, Scope, Preferences):  Full Scope Treatment  Psycho-social/Spiritual:   Desire for further Chaplaincy support:no   Prognosis:   Unable to determine  Discharge Planning: To Be Determined      Primary Diagnoses: Present on Admission: . Ischemic cardiomyopathy . Essential hypertension . Coronary artery disease involving native coronary artery with angina pectoris ([place]) . Chronic combined systolic and diastolic CHF (congestive heart failure) ([place]) . CKD (chronic kidney disease), stage [name] . Excessive drinking of alcohol . Lower extremity cellulitis   I  have reviewed the medical record, interviewed the patient and family, and examined the patient. The following aspects are pertinent.  Past Medical History:  Diagnosis Date  . AICD (automatic cardioverter/defibrillator) present 08/26/2015   STJ  . Anxiety   . Atherosclerotic heart disease of native coronary artery with angina pectoris ([place]) 07/2015   a. 07/2015 Posterior STEMI/PCI: LM nl, LAd 40ost, RI 40, RCA 18m (3.0x22 Resolute Integrity DES distal, 3.0x12 Resolute Integrity DES prox), 90d (PTCA), RPDA small, nl, RPLB2 100 - too small for PTCA. -- patent in f/u cath 08/2015 & 05/2016 - patent RCA stents (~10%), RI 40%, LM ~30% ost LAD ~40%.  . Chronic combined systolic and diastolic CHF (congestive heart failure) ([place]) 07/2015   a. 07/2015 Ehco: EF 45-50%, basal-mid inf and infsept AK, inflat, apical inf, and apical septal HK, Gr1 DD, triv MR.;; b - Echo 05/2016 -> EF 20-25%, Gr 2 DD.  Severe diffuse HK disproportionately severe HK and thinning of apex, anterior septum and anterior wall-with septal dyssynchrony.  PAP ~53 mmHg.  [name] Hyperlipidemia with target low density lipoprotein (LDL) cholesterol less than 70 mg/dL   . Hypertensive heart disease   . Ischemic cardiomyopathy 07/2015 - 05/2016   a. 07/2015 Echo: EF 45-50%.; Follow-up Echo 08/23/2015: EF 50-55% with mild inferior hypokinesis --> 05/2016: EF 20-25%, diffuse HK. PAP ~53 mmHg.  . Kidney disease, chronic, stage [name] (GFR 30-59 ml/min) 08/12/2015  . Morbid obesity ([place])   . ST elevation myocardial infarction (STEMI) of inferior wall ([place]) 08/12/2015   Subacute presentation for inferior MI with post infarction angina on the following day. EKG still shows injury current. Therefore decided to proceed with intervention of 100% mRCA thrombotic lesion.  . Sustained ventricular fibrillation ([place]) 08/22/2015   Underwent EP evaluation with final ICD implantation.   Social History   Socioeconomic History  . Marital status: Married    Spouse name:  Not on file  . Number of children: Not on file  . Years of education: Not on file  . Highest education level: Not on file  Occupational History  . Not on file  Social Needs  . Financial resource strain: Not on file  . Food insecurity    Worry: Not on file    Inability: Not on file  . Transportation needs    Medical: Not on file    Non-medical: Not on file  Tobacco Use  . Smoking status: Never Smoker  . Smokeless tobacco: Never Used  Substance and Sexual Activity  . Alcohol use: Yes    Alcohol/week: 3.0 - 4.0 standard drinks    Types: 3 - 4 Shots of liquor per week    Comment: 3-4 glasses of scotch 3-4 days per week  . Drug use: No  . Sexual activity: Not on file  Lifestyle  . Physical activity    Days per week: Not on file    Minutes per session: Not on file  .  Stress: Not on file  Relationships  . Social Herbalist on phone: Not on file    Gets together: Not on file    Attends religious service: Not on file    Active member of club or organization: Not on file    Attends meetings of clubs or organizations: Not on file    Relationship status: Not on file  Other Topics Concern  . Not on file  Social History Narrative   He tells me that his daughter died on Halloween 05/05/15. She had a diving accident at the age of 65 and been quadriplegic for 25 years. She had been incredibly vigorous girl. He showed me her high school graduation picture.She was as he reported beautiful.  Her name was [name].   Family History  Problem Relation Age of Onset  . Diabetes Mother   . Heart disease Paternal Grandmother   . Heart disease Paternal Grandfather    Scheduled Meds: . aspirin EC  81 mg Oral Daily  . atorvastatin  80 mg Oral q1800  . cephALEXin  500 mg Oral Q12H  . Chlorhexidine Gluconate Cloth  6 each Topical Daily  . digoxin  0.125 mg Oral q1800  . folic acid  1 mg Oral Daily  . heparin  5,000 Units Subcutaneous Q8H  . LORazepam  0-4 mg Oral Q12H  . multivitamin with  minerals  1 tablet Oral Daily  . sodium chloride flush  10-40 mL Intracatheter Q12H  . sodium chloride flush  3 mL Intravenous Q12H  . sodium chloride flush  3 mL Intravenous Q12H  . thiamine  100 mg Oral Daily   Or  . thiamine  100 mg Intravenous Daily  . ticagrelor  90 mg Oral BID  . torsemide  40 mg Oral BID  . venlafaxine  75 mg Oral Daily   Continuous Infusions: . sodium chloride    . sodium chloride    . milrinone 0.125 mcg/kg/min (12/20/18 0653)   PRN Meds:.sodium chloride, sodium chloride, acetaminophen, LORazepam **OR** LORazepam, ondansetron (ZOFRAN) IV, sodium chloride flush, sodium chloride flush, sodium chloride flush Medications Prior to Admission:  Prior to Admission medications   Medication Sig Start Date End Date Taking? Authorizing Provider  acetaminophen (TYLENOL) 500 MG tablet Take 500-1,000 mg by mouth every 6 (six) hours as needed for mild pain or headache.   Yes [provider]  aspirin EC 81 MG tablet Take 1 tablet (81 mg total) by mouth daily. 08/14/15  Yes Arbutus Leas, NP  atorvastatin (LIPITOR) 80 MG tablet TAKE 1 TABLET BY MOUTH DAILY AT 6 PM; [place] REFILLS Patient taking differently: Take 80 mg by mouth daily at 6 PM.  12/12/18  Yes Deboraha Sprang, MD  BRILINTA 90 MG TABS tablet TAKE 1 TABLET BY MOUTH TWICE DAILY(NEEDS OFFICE VISIT FOR REFILLS) Patient taking differently: Take 90 mg by mouth 2 (two) times daily.  10/25/18  Yes Deboraha Sprang, MD  cephALEXin (KEFLEX) 500 MG capsule Take 1 capsule (500 mg total) by mouth 2 (two) times daily. 12/05/18  Yes Baldwin Jamaica, PA-C  Coenzyme Q10 (COQ10) 200 MG CAPS Take 200 mg by mouth daily.   Yes [provider]  digoxin (LANOXIN) 0.125 MG tablet TAKE 1 TABLET BY MOUTH DAILY Patient taking differently: Take 0.125 mg by mouth daily at 6 PM.  11/22/18  Yes Leonie Man, MD  MAGNESIUM-OXIDE 400 (241.3 Mg) MG tablet TAKE 1 TABLET  BY MOUTH TWICE  DAILY Patient taking differently: Take 400 mg by mouth 2 (two) times daily.  11/09/18  Yes Leonie Man, MD  metoprolol succinate (TOPROL-XL) 50 MG 24 hr tablet TAKE 1 TABLET BY MOUTH DAILY Patient taking differently: Take 50 mg by mouth daily.  10/24/18  Yes Leonie Man, MD  Multiple Vitamin (MULTIVITAMIN) tablet Take 1 tablet by mouth daily.   Yes [provider]  nitroGLYCERIN (NITROSTAT) 0.4 MG SL tablet Place 1 tablet (0.4 mg total) under the tongue every 5 (five) minutes as needed for chest pain. 08/14/15  Yes Arbutus Leas, NP  Omega-3 Fatty Acids (FISH OIL) 1200 MG CPDR Take 1,200 mg by mouth daily.    Yes [provider]  sacubitril-valsartan (ENTRESTO) 49-51 MG Take 1 tablet by mouth 2 (two) times daily. 12/12/18  Yes Deboraha Sprang, MD  spironolactone (ALDACTONE) 25 MG tablet Take 1 tablet (25 mg total) by mouth daily. Patient taking differently: Take 12.5 mg by mouth daily.  12/05/18  Yes Baldwin Jamaica, PA-C  torsemide (DEMADEX) 20 MG tablet Take 2 tablets (40 mg total) by mouth 2 (two) times daily. every other day Patient taking differently: Take 80 mg by mouth every other day.  12/14/18  Yes Baldwin Jamaica, PA-C  venlafaxine (EFFEXOR) 75 MG tablet TAKE 1 TABLET BY MOUTH EVERY DAY Patient taking differently: Take 75 mg by mouth daily.  11/22/18  Yes Henson, Vickie L, NP-C  Cholecalciferol (VITAMIN D) 2000 units CAPS Take 1 Units by mouth daily.     [provider]  Investigational - Study Medication Take 1 tablet by mouth 2 (two) times daily. Study name: Galactic HF Study Additional study details: Omecamtiv Mecarbil or Placebo Patient not taking: Reported on 12/17/2018 06/25/16   Larey Dresser, MD   Allergies  Allergen Reactions  . Clams [Shellfish Allergy] Shortness Of Breath, Itching and Swelling  . Shellfish-Derived Products Shortness Of Breath, Itching and Swelling  . Other Itching and Other (See Comments)    Ragweed causes watery  eyes, itching, sneezing  . Sulfa Antibiotics Other (See Comments)    Pt unsure of reaction, mother told him he was allergic    Review of Systems  Neurological: Positive for weakness.    Physical Exam  Vital Signs: BP 105/78 (BP Location: Left Arm)   Pulse (!) 114   Temp 98.2 F (36.8 C) (Oral)   Resp 18   Ht 6' 1.5" (1.867 m)   Wt 123.6 kg   SpO2 93%   BMI 35.46 kg/m  Pain Scale: 0-10   Pain Score: 0-No pain   SpO2: SpO2: 93 % O2 Device:SpO2: 93 % O2 Flow Rate: .   IO: Intake/output summary:   Intake/Output Summary (Last 24 hours) at 12/20/2018 0950 Last data filed at 12/20/2018 2585 Gross per 24 hour  Intake 1163.02 ml  Output 4800 ml  Net -3636.98 ml    LBM: Last BM Date: 12/19/18 Baseline Weight: Weight: 132 kg Most recent weight: Weight: 123.6 kg     Palliative Assessment/Data: 30 % at best    Discussed with Dr Horris Latino  Time In: 1300 Time Out: 1415 Time Total: 75 minutes Greater than 50%  of this time was spent counseling and coordinating care related to the above assessment and plan.  Signed by: Wadie Lessen, NP   Please contact Palliative Medicine Team phone at [phone] for questions and concerns.  For individual provider: See Shea Evans

## 2018-12-21 DIAGNOSIS — R531 Weakness: Secondary | ICD-10-CM

## 2018-12-21 LAB — CBC WITH DIFFERENTIAL/PLATELET
Abs Immature Granulocytes: 0.06 10*3/uL (ref 0.00–0.07)
Basophils Absolute: 0 10*3/uL (ref 0.0–0.1)
Basophils Relative: 0 %
Eosinophils Absolute: 0.3 10*3/uL (ref 0.0–0.5)
Eosinophils Relative: 3 %
HCT: 45.3 % (ref 39.0–52.0)
Hemoglobin: 14.9 g/dL (ref 13.0–17.0)
Immature Granulocytes: 1 %
Lymphocytes Relative: 12 %
Lymphs Abs: 1.5 10*3/uL (ref 0.7–4.0)
MCH: 28.6 pg (ref 26.0–34.0)
MCHC: 32.9 g/dL (ref 30.0–36.0)
MCV: 86.9 fL (ref 80.0–100.0)
Monocytes Absolute: 1 10*3/uL (ref 0.1–1.0)
Monocytes Relative: 8 %
Neutro Abs: 9.1 10*3/uL — ABNORMAL HIGH (ref 1.7–7.7)
Neutrophils Relative %: 76 %
Platelets: 274 10*3/uL (ref 150–400)
RBC: 5.21 MIL/uL (ref 4.22–5.81)
RDW: 15.4 % (ref 11.5–15.5)
WBC: 11.9 10*3/uL — ABNORMAL HIGH (ref 4.0–10.5)
nRBC: 0 % (ref 0.0–0.2)

## 2018-12-21 LAB — BASIC METABOLIC PANEL
Anion gap: 17 — ABNORMAL HIGH (ref 5–15)
BUN: 44 mg/dL — ABNORMAL HIGH (ref 8–23)
CO2: 26 mmol/L (ref 22–32)
Calcium: 8.7 mg/dL — ABNORMAL LOW (ref 8.9–10.3)
Chloride: 86 mmol/L — ABNORMAL LOW (ref 98–111)
Creatinine, Ser: 2.04 mg/dL — ABNORMAL HIGH (ref 0.61–1.24)
GFR calc Af Amer: 36 mL/min — ABNORMAL LOW (ref >60)
GFR calc non Af Amer: 31 mL/min — ABNORMAL LOW (ref >60)
Glucose, Bld: 101 mg/dL — ABNORMAL HIGH (ref 70–99)
Potassium: 3.8 mmol/L (ref 3.5–5.1)
Sodium: 129 mmol/L — ABNORMAL LOW (ref 135–145)

## 2018-12-21 LAB — COOXEMETRY PANEL
Carboxyhemoglobin: 1.2 % (ref 0.5–1.5)
Methemoglobin: 0.5 % (ref 0.0–1.5)
O2 Saturation: 58 %
Total hemoglobin: 15.2 g/dL (ref 12.0–16.0)

## 2018-12-21 NOTE — Progress Notes (Signed)
Patient ID: Leonard [name], male   DOB: April 12, 1945, 73 y.o.   MRN: [id number]  This NP visited patient at the bedside as a follow up to yesterday's goals of care meeting and to assess for palliative medicine needs and support.  I met at the bedside with his wife/Leonard [name] and close friend Leonard [name] to continue discussion regarding diagnosis, prognosis, goals of care, treatment option decisions, advanced directive decisions and anticipatory care needs.  We discussed the seriousness of the patient's current medical situation; ischemic CM with severe LV dysfunction with an EF of 15 to 20%/Saint Jude ICD, acute kidney injury/creatinine today is 2.04, hyponatremia, coronary artery disease, bilateral lower extremity cellulitis and weakness and deconditioning.  We discussed the difference between an aggressive medical intervention path versus a palliative comfort path for this patient at this time in this situation. Patient and his wife understand the seriousness of his current medical situation.  Leonard [name] "how hard it is" coming to the realization that his body is sick and that he is losing his independence.   Emotional support offered  Plan of care -Patient is open to all offered and available medical interventions to prolong life -Full code -Hopeful for continued improvement and transition to CIR for rehabilitation -Ultimately patient wishes to return home.  His wife is currently looking into in-home nursing aide assistance    Advanced Directive and Healthcare Power of Attorney  paperwork is placed in chart.  Discussed with patient the importance of continued conversation with family and their  medical providers regarding overall plan of care and treatment options,  ensuring decisions are within the context of the patients values and GOCs.  This nurse practitioner informed  the patient/family and the attending that I will be out of the hospital until Monday morning.  If the patient is  still hospitalized I will follow-up at that time.  Questions and concerns addressed  Call palliative medicine team phone # [phone] with questions or concerns.     Discussed with Dr Verlon Au  Total time spent on the unit was 60 minutes  Greater than 50% of the time was spent in counseling and coordination of care  Wadie Lessen NP  Palliative Medicine Team Team Phone # [phone] Pager [phone]

## 2018-12-21 NOTE — Progress Notes (Addendum)
Hospitalist progress note + chart summary  Leonard [name] [id number] DOB: Sep 04, 1945 DOA: 12/17/2018  PCP: Girtha Rm, NP-C   Narrative:  1 white male prio  posterior STEMI DES-RCA 07/2015 NYHA class II systolic and diastolic heart failure, -single-chamber ICD placed at that time-EF 15 to 20% % NSVT without shocks, VF 2000, chronic LBBB Ethanol habituation 3/day  Presented to the hospital 11/28 after 2 to 3 weeks of marked volume overload inadequate diuresis despite adjustment of torsemide and Aldactone in outpatient setting-found to be volume overloaded but hypotensive in the ED--found to have weeping lower extremities  Cardiology saw the patient and the heart failure team has been managing patient on inotropes  Data Reviewed:  Sodium 129 BUN/creatinine 44/2.01 WBC 11.9 hemoglobin 14.9 Assessment & Plan: NYHA class IIIb heart failure (last check weight 132 kg 12/05/2018 last check) in the setting of ischemic cardiomyopathy + PPM-deferring to cardiology management-refusing palliative care-currently cut back to torsemide 40 twice daily given hypotension--5.4 L weight now 124 kg, Primacor 0.125 disposition per cardiology-would keep Unna boots on to facilitate fluid removal but may give a break in the next couple of days if he allows-a.m. labs  CKD 3B-4 with hypervolemic hyponatremia, hyperkalemic this admission which is resolved after Lokelma-monitor trends with labs diurese as able-diuretics were adjusted earlier today see above discussion  Status post pacemaker placement 2017  lower extremity cellulitis-White count down from 14-11.9 continue Keflex X1 more day and reassess wounds in a.m.  Posterior STEMI DES 07/2015-hypotensive therefore no Toprol-XL 50 Aldactone 25 digoxin 0.125-continue aspirin 81 daily lower extremity cellulitis  Ethanol habituation-discontinue CIWA patient is not withdrawing  Subjective:   reiterates wishes to go to rehab and eventually home-I did not  broach palliative discussions but palliative care practitioner spoke with me and confirmed patient wants everything done Consultants:   AHF team  Procedures:   Echo this admission EF 15-20%  Antimicrobials:   Keflex   Objective: Vitals:   12/21/18 1136 12/21/18 1145 12/21/18 1228 12/21/18 1254  BP: (!) 73/56 90/61 (!) 83/56 (!) 86/50  Pulse: (!) 125 (!) 118    Resp:      Temp:      TempSrc:      SpO2: 97%     Weight:      Height:        Intake/Output Summary (Last 24 hours) at 12/21/2018 1541 Last data filed at 12/21/2018 1300 Gross per 24 hour  Intake 500 ml  Output 925 ml  Net -425 ml   Filed Weights   12/19/18 0656 12/20/18 0649 12/21/18 0527  Weight: 132.2 kg 123.6 kg 124.4 kg    Examination: Awake alert coherent no distress EOMI NCAT no focal deficit Somewhat weak Chest clear cannot appreciate JVD no bruit S1-S2 no murmur rub or gallop on monitors heart rates ranging A. fib 120s times Abdomen soft not distended no rebound no guarding Neurologically intact  Scheduled Meds: . aspirin EC  81 mg Oral Daily  . atorvastatin  80 mg Oral q1800  . cephALEXin  500 mg Oral Q12H  . Chlorhexidine Gluconate Cloth  6 each Topical Daily  . folic acid  1 mg Oral Daily  . heparin  5,000 Units Subcutaneous Q8H  . LORazepam  0-4 mg Oral Q12H  . multivitamin with minerals  1 tablet Oral Daily  . sodium chloride flush  10-40 mL Intracatheter Q12H  . sodium chloride flush  3 mL Intravenous Q12H  . sodium chloride flush  3 mL Intravenous Q12H  .  thiamine  100 mg Oral Daily   Or  . thiamine  100 mg Intravenous Daily  . ticagrelor  90 mg Oral BID  . torsemide  40 mg Oral BID  . venlafaxine  75 mg Oral Daily   Continuous Infusions: . sodium chloride    . sodium chloride    . milrinone 0.125 mcg/kg/min (12/20/18 2036)     LOS: 3 days 40 40 Time spent: Lynn, MD [facility]  12/21/2018, 3:41 PM

## 2018-12-21 NOTE — Progress Notes (Signed)
Patient BP 86/50 manually Torsemide hold per HF PA-C.pt. denies dizziness. Will continue to monitor the patient

## 2018-12-21 NOTE — Progress Notes (Signed)
Physical Therapy Treatment Patient Details Name: Leonard Leonard [name] MRN: [id number] DOB: [birth year] Today's Date: 12/21/2018    History of Present Illness 73 y.o. male with medical history significant for coronary artery disease, ischemic cardiomyopathy with EF 15 to 20%, chronic kidney disease stage Leonard [name], and excessive daily alcohol use, now presenting to the emergency department for evaluation of generalized weakness and inability to stand.  Pitting edema bil LEs with weeping serous fluid.  Pt has now been changed to inpatient status and had HF team consult.  Pt receiving lasix, milirone drip, PICC placed and CVP monitoring.  Note that pt has now agreed to CIR at d/c.    PT Comments    Pt's treatment was limited due to hypotension and tachycardia upon sitting that did not resolve.  He required increased time with transfers for his effort and vital monitoring.  Pt with decreased safety awareness and continues to need PT cues and assist for transfers.  Pt remains +2 for safety and assist.  Pt motivated to do more activity, but not safe to do so at this time due to vitals.   Follow Up Recommendations  CIR     Equipment Recommendations  None recommended by PT    Recommendations for Other Services       Precautions / Restrictions Precautions Precautions: Fall Precaution Comments: PICC, CVP monitoring, monitor vitals    Mobility  Bed Mobility Overal bed mobility: Needs Assistance Bed Mobility: Supine to Sit;Sit to Supine     Supine to sit: Mod assist;HOB elevated Sit to supine: Mod assist;+2 for physical assistance   General bed mobility comments: use of bed rails, required mod A to scoot forward for supine/sit; and mod A for legs sit to supine; +2 for safety  Transfers                 General transfer comment: Unable to perform OOB transfers due to unstable vitals.  Pt did sit EOB for 8 mins prior to returning to supine during prepping for OOB (waiting for assist) and then  during vital monitoring  Ambulation/Gait                 Stairs             Wheelchair Mobility    Modified Rankin (Stroke Patients Only)       Balance   Sitting-balance support: Bilateral upper extremity supported;Feet supported Sitting balance-Leahy Scale: Fair                                      Cognition Arousal/Alertness: Awake/alert Behavior During Therapy: WFL for tasks assessed/performed Overall Cognitive Status: Within Functional Limits for tasks assessed                                 General Comments: Spoke with RN and pt clear for PT;  pt with decreased safety awareness (wanting to still get in chair even when told unsafe due to tachycardia, stated "well my defibrillator would go off.")      Exercises      General Comments General comments (skin integrity, edema, etc.):  Pts vitals at rest: HR 105 bpm ; CVP 7; BP 90's/50's Pts vitals initially EOB: HR 110 bpm; CVP 9; BP 90's/50s Pts vitals after 6 min EOB: HR 130; CVP 9; BP 70's/40s Returned to supine with  nurse and nurse tech present      Pertinent Vitals/Pain Pain Assessment: No/denies pain    Home Living                      Prior Function            PT Goals (current goals can now be found in the care plan section) Progress towards PT goals: Not progressing toward goals - comment(limited by tachycardia and hypotension)    Frequency    Min 1X/week      PT Plan Current plan remains appropriate    Co-evaluation              AM-PAC PT "6 Clicks" Mobility   Outcome Measure  Help needed turning from your back to your side while in a flat bed without using bedrails?: A Lot Help needed moving from lying on your back to sitting on the side of a flat bed without using bedrails?: A Lot Help needed moving to and from a bed to a chair (including a wheelchair)?: Total Help needed standing up from a chair using your arms (e.g.,  wheelchair or bedside chair)?: Total Help needed to walk in hospital room?: Total Help needed climbing 3-5 steps with a railing? : Total 6 Click Score: 8    End of Session Equipment Utilized During Treatment: Gait belt Activity Tolerance: Treatment limited secondary to medical complications (Comment) Patient left: in bed;with call bell/phone within reach;with bed alarm set(RN and CNA present) Nurse Communication: Mobility status(vitals) PT Visit Diagnosis: Unsteadiness on feet (R26.81);Other abnormalities of gait and mobility (R26.89);Muscle weakness (generalized) (M62.81)     Time: 1115-1140 PT Time Calculation (min) (ACUTE ONLY): 25 min  Charges:  $Therapeutic Activity: 23-37 mins                     Maggie Font, PT Acute Rehab Services Pager [phone] [facility] [phone] [facility] [phone]    Karlton Lemon 12/21/2018, 12:56 PM

## 2018-12-21 NOTE — Progress Notes (Addendum)
Advanced Heart Failure Rounding Note  PCP-Cardiologist: Glenetta Hew, MD  AHF: Dr. Haroldine Laws   Subjective:    Milrinone 0.125 mcg added 11/30 due to low CO-OX. Todays CO-OX 58%.   1.6L out in UOP yesterday but wt up 2 lb, 272>>274. Overall 17 lb down from admit wt. Switched from IV Lasix to PO torsemide, 40 mg bid yesterday. Entresto held yesterday due to hypotension. BP improved but SBPs in the low 90s. Tachycardic in the 110s-120s.   Bump in SCr and BUN past 24 hrs. SCr up from 1.73>>2.04. BUN 34>>44. Na low at 129.   CVP 7. Only complaint is having unna boots on. Asking if they can be removed for a day. He wants to go to CIR for therapy.    Objective:   Weight Range: 124.4 kg Body mass index is 35.69 kg/m.   Vital Signs:   Temp:  [97.7 F (36.5 C)-98.6 F (37 C)] 97.8 F (36.6 C) (12/02 0720) Pulse Rate:  [42-133] 69 (12/02 0720) Resp:  [18-26] 18 (12/02 0720) BP: (74-119)/(44-99) 93/68 (12/02 0720) SpO2:  [81 %-98 %] 96 % (12/02 0720) Weight:  [124.4 kg] 124.4 kg (12/02 0527) Last BM Date: 12/20/18  Weight change: Filed Weights   12/19/18 0656 12/20/18 0649 12/21/18 0527  Weight: 132.2 kg 123.6 kg 124.4 kg    Intake/Output:   Intake/Output Summary (Last 24 hours) at 12/21/2018 0927 Last data filed at 12/21/2018 0527 Gross per 24 hour  Intake 620 ml  Output 1625 ml  Net -1005 ml      Physical Exam   CVP 7 (personally checked) PHYSICAL EXAM: General:  Obese, chronically ill appearing WM. No respiratory difficulty HEENT: normal anicteric  Neck: supple. JVP 6-7 . Carotids 2+ bilat; no bruits. No lymphadenopathy or thyromegaly appreciated. Cor: PMI nondisplaced. Regular rhythm, tachy rate. No rubs, gallops or murmurs. Lungs: clear decreased at bases Abdomen: obese soft, nontender, nondistended. No hepatosplenomegaly. No bruits or masses. Good bowel sounds. Extremities: no cyanosis, clubbing, rash, edema, bilateral unna boots 1+ edema  Neuro: alert &  oriented x 3, cranial nerves grossly intact. moves all 4 extremities w/o difficulty. Affect pleasant   Telemetry   Sinus Tach low 100s-120s Personally reviewed   EKG    No new EKG to review  Labs    CBC Recent Labs    12/20/18 0434 12/21/18 0425  WBC 12.7* 11.9*  NEUTROABS 10.1* 9.1*  HGB 14.7 14.9  HCT 45.7 45.3  MCV 88.1 86.9  PLT 272 540   Basic Metabolic Panel Recent Labs    12/20/18 0434 12/21/18 0425  NA 132* 129*  K 3.4* 3.8  CL 90* 86*  CO2 28 26  GLUCOSE 127* 101*  BUN 34* 44*  CREATININE 1.73* 2.04*  CALCIUM 8.9 8.7*   Liver Function Tests No results for input(s): AST, ALT, ALKPHOS, BILITOT, PROT, ALBUMIN in the last 72 hours. No results for input(s): LIPASE, AMYLASE in the last 72 hours. Cardiac Enzymes No results for input(s): CKTOTAL, CKMB, CKMBINDEX, TROPONINI in the last 72 hours.  BNP: BNP (last 3 results) Recent Labs    12/18/18 0456  BNP 2,328.4*    ProBNP (last 3 results) No results for input(s): PROBNP in the last 8760 hours.   D-Dimer No results for input(s): DDIMER in the last 72 hours. Hemoglobin A1C No results for input(s): HGBA1C in the last 72 hours. Fasting Lipid Panel No results for input(s): CHOL, HDL, LDLCALC, TRIG, CHOLHDL, LDLDIRECT in the last 72 hours. Thyroid  Function Tests No results for input(s): TSH, T4TOTAL, T3FREE, THYROIDAB in the last 72 hours.  Invalid input(s): FREET3  Other results:   Imaging    No results found.   Medications:     Scheduled Medications: . aspirin EC  81 mg Oral Daily  . atorvastatin  80 mg Oral q1800  . cephALEXin  500 mg Oral Q12H  . Chlorhexidine Gluconate Cloth  6 each Topical Daily  . digoxin  0.125 mg Oral q1800  . folic acid  1 mg Oral Daily  . heparin  5,000 Units Subcutaneous Q8H  . LORazepam  0-4 mg Oral Q12H  . multivitamin with minerals  1 tablet Oral Daily  . sodium chloride flush  10-40 mL Intracatheter Q12H  . sodium chloride flush  3 mL Intravenous  Q12H  . sodium chloride flush  3 mL Intravenous Q12H  . thiamine  100 mg Oral Daily   Or  . thiamine  100 mg Intravenous Daily  . ticagrelor  90 mg Oral BID  . torsemide  40 mg Oral BID  . venlafaxine  75 mg Oral Daily    Infusions: . sodium chloride    . sodium chloride    . milrinone 0.125 mcg/kg/min (12/20/18 2036)    PRN Medications: sodium chloride, sodium chloride, acetaminophen, ondansetron (ZOFRAN) IV, sodium chloride flush, sodium chloride flush, sodium chloride flush    Patient Profile   73 y/o male with multiple medical issues including ischemic CM with severe LV dysfunction with EF 15-20%. Has had severe HF for several years with low output on RHC in 2018. Admitted for a/c systolic HF w/ marked volume overload and bilateral LE cellulitis.   Assessment/Plan    1. 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  Admitted w/ NYHA IIIb symptoms and massive volume overload. BNP 2328.  ECHO this admit EF 10-15% RV moderately entarged. RA/LA severely dilated.  Has St Jude ICD . Has LBBB will ask EP consider upgrade to CRT-D  Milrinone started due to low CO-OX and to assist with diuresis.  Todays CO-OX 58 on milrinone 0.125 mcg.  - CVP 7.  - Continue torsemide. Had bump in SCr from 1.7>>2.0. ? Reducing torsemide dose to 40/20 dosing - Will hold digoxin and Entresto today given rise in SCr to 2.0 - BP too soft for Bidil  - No  blocker due to low output - No SGLt2 with chronic rash under his pannus.  - He is asking if bilateral unna boots can be removed for a day. Will defer to MD  2. AKI: baseline SCr ~1.3. SCr 1.8 on admit in the setting of a/c CHF w/ volume overload. 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 SCr was improving w/ diuresis but trending back up after change to PO torsemide, 1.86>>1.77>>1.73>>2.0 - Holding digoxin and Entresto today. ? Reducing torsemide dose to 40/20 dosing -  Monitor renal function closely   3. Bilateral LE  Cellulitis:  - on Keflex 500 bid per IM  - WBC trending down. Remains afebrile   4. Hyponatremia: - Sodium 129 -Watch closely.  - likely 2/2 hypervolemia.   5. CAD: hx of STEMI 2017 s/p DES LAD, non-obs dz at cath 2018 - no s/s of angina  - Continue medical therapy: ASA, Brilinta and statin. No  blocker due to low output.   6. LBBB  Back in 2019 Dr Caryl Comes had planned to upgrade to CRT-D. We will need to follow up with EP.  QRS 178 ms   7.  Deconditioning CIR consulted.   Length of Stay: [address], [place]  12/21/2018, 9:27 AM  Advanced Heart Failure Team Pager [phone] (M-F; 7a - 4p)  Please contact [facility] for night-coverage after hours (4p -7a ) and weekends on [email or web address]  Patient seen and examined with the above-signed Advanced Practice Provider and/or Housestaff. I personally reviewed laboratory data, imaging studies and relevant notes. I independently examined the patient and formulated the important aspects of the plan. I have edited the note to reflect any of my changes or salient points. I have personally discussed the plan with the patient and/or family.  Remains on milrinone. Co-ox 58% Weight up 2 pounds overnight but down 17 pounds total. Creatinine continues to rise. CVP 6-7. Will stop diuretics. Agree with holding spiro and Entresto for now due to hypotension. Continue milrinone. If creatinine stable or improved tomorrow will begin milrinone wean. OK to remove UNNA boots.   Tele reviewed personally. Suspect possible MAT. Will review with EP.   D/w Dr. Verlon Au at bedside  Glori Bickers, MD  4:11 PM

## 2018-12-21 NOTE — Progress Notes (Signed)
MD Notified see epic for medications adjustment

## 2018-12-22 DIAGNOSIS — R531 Weakness: Secondary | ICD-10-CM | POA: Diagnosis not present

## 2018-12-22 LAB — CBC WITH DIFFERENTIAL/PLATELET
Abs Immature Granulocytes: 0.04 10*3/uL (ref 0.00–0.07)
Basophils Absolute: 0 10*3/uL (ref 0.0–0.1)
Basophils Relative: 0 %
Eosinophils Absolute: 0.3 10*3/uL (ref 0.0–0.5)
Eosinophils Relative: 3 %
HCT: 45.1 % (ref 39.0–52.0)
Hemoglobin: 14.7 g/dL (ref 13.0–17.0)
Immature Granulocytes: 0 %
Lymphocytes Relative: 12 %
Lymphs Abs: 1.4 10*3/uL (ref 0.7–4.0)
MCH: 28.5 pg (ref 26.0–34.0)
MCHC: 32.6 g/dL (ref 30.0–36.0)
MCV: 87.4 fL (ref 80.0–100.0)
Monocytes Absolute: 0.9 10*3/uL (ref 0.1–1.0)
Monocytes Relative: 8 %
Neutro Abs: 9 10*3/uL — ABNORMAL HIGH (ref 1.7–7.7)
Neutrophils Relative %: 77 %
Platelets: 285 10*3/uL (ref 150–400)
RBC: 5.16 MIL/uL (ref 4.22–5.81)
RDW: 15.7 % — ABNORMAL HIGH (ref 11.5–15.5)
WBC: 11.7 10*3/uL — ABNORMAL HIGH (ref 4.0–10.5)
nRBC: 0 % (ref 0.0–0.2)

## 2018-12-22 LAB — BASIC METABOLIC PANEL
Anion gap: 15 (ref 5–15)
BUN: 47 mg/dL — ABNORMAL HIGH (ref 8–23)
CO2: 25 mmol/L (ref 22–32)
Calcium: 8.4 mg/dL — ABNORMAL LOW (ref 8.9–10.3)
Chloride: 91 mmol/L — ABNORMAL LOW (ref 98–111)
Creatinine, Ser: 2.12 mg/dL — ABNORMAL HIGH (ref 0.61–1.24)
GFR calc Af Amer: 34 mL/min — ABNORMAL LOW (ref >60)
GFR calc non Af Amer: 30 mL/min — ABNORMAL LOW (ref >60)
Glucose, Bld: 98 mg/dL (ref 70–99)
Potassium: 3.6 mmol/L (ref 3.5–5.1)
Sodium: 131 mmol/L — ABNORMAL LOW (ref 135–145)

## 2018-12-22 LAB — VITAMIN B1: Vitamin B1 (Thiamine): 126.8 nmol/L (ref 66.5–200.0)

## 2018-12-22 LAB — COOXEMETRY PANEL
Carboxyhemoglobin: 1.6 % — ABNORMAL HIGH (ref 0.5–1.5)
Methemoglobin: 0.8 % (ref 0.0–1.5)
O2 Saturation: 61.4 %
Total hemoglobin: 15.4 g/dL (ref 12.0–16.0)

## 2018-12-22 MED ORDER — IVABRADINE HCL 5 MG PO TABS
5.0000 mg | ORAL_TABLET | Freq: Two times a day (BID) | ORAL | Status: DC
  Administered 2018-12-22: 5 mg via ORAL
  Filled 2018-12-22 (×2): qty 1

## 2018-12-22 MED ORDER — FLUOXETINE HCL 10 MG PO CAPS
10.0000 mg | ORAL_CAPSULE | Freq: Every day | ORAL | Status: DC
  Administered 2018-12-22 – 2018-12-23 (×2): 10 mg via ORAL
  Filled 2018-12-22 (×3): qty 1

## 2018-12-22 NOTE — Plan of Care (Signed)

## 2018-12-22 NOTE — Progress Notes (Signed)
   Vital Signs MEWS/VS Documentation       12/22/2018 0651 12/22/2018 0700 12/22/2018 0729 12/22/2018 0750   MEWS Score:  3  3  2  2    MEWS Score Color:  Yellow  Yellow  Yellow  Yellow   Pulse:  --  --  96  --   BP:  --  --  100/64  --   Temp:  98.5 F (36.9 C)  --  --  --   O2 Device:  --  --  Nasal Cannula  --   O2 Flow Rate (L/min):  --  --  2 L/min  --   Level of Consciousness:  --  --  --  Alert            Leonard [name] 12/22/2018,10:09 AM  Patient BP is currently 107/72 (80) with HR of 101 after retaking it.

## 2018-12-22 NOTE — Care Management Important Message (Signed)
Important Message  Patient Details  Name: Leonard [name] MRN: [id number] Date of Birth: 04/06/1945   Medicare Important Message Given:  Yes     Zenon Mayo, RN 12/22/2018, 3:41 PM

## 2018-12-22 NOTE — Progress Notes (Signed)
  Discussed with Dr. Caryl Comes. He is to review EKGs and tele.    Legrand Como [address] [place], PA-C  12/22/2018

## 2018-12-22 NOTE — Progress Notes (Signed)
Patient's wife would like an update from cardiology. She is currently at the bedside. MD paged.

## 2018-12-22 NOTE — Progress Notes (Signed)
Orthopedic Tech Progress Note Patient Details:  Leonard [name] February 15, 1945 [id number]  Ortho Devices Type of Ortho Device: Louretta Parma boot Ortho Device/Splint Location: Bilateral unna boots Ortho Device/Splint Interventions: Application   Post Interventions Patient Tolerated: Well Instructions Provided: Care of device   [name] 12/22/2018, 4:09 PM

## 2018-12-22 NOTE — Progress Notes (Addendum)
Hospitalist progress note + chart summary  Leonard [name] [id number] DOB: 08-08-1945 DOA: 12/17/2018  PCP: Girtha Rm, NP-C   Narrative:  24 white male posterior STEMI DES-RCA 07/2015 NYHA class II systolic and diastolic heart failure, -single-chamber ICD placed at that time-EF 15 to 20% % NSVT without shocks, VF 2000, chronic LBBB Ethanol habituation 3/day  Presented to the hospital 11/28 after 2 to 3 weeks of marked volume overload inadequate diuresis despite adjustment of torsemide and Aldactone in outpatient setting-found to be volume overloaded but hypotensive in the ED--found to have weeping lower extremities  Cardiology saw the patient and the heart failure team has been managing patient on inotropes  Data Reviewed:  Sodium 129-->131 BUN/creatinine 44/2.01-->47/2.1 WBC 11.9-->11.7 hemoglobin 14.9 Assessment & Plan: NYHA class IIIb heart failure (last check weight 132 kg 12/05/2018 last check) in the setting of ischemic cardiomyopathy + PPM-deferring to cardiology management Current meds-atorvastatin Brilinta- --5.8 L, weight 124 kg,   CKD 3B-4 with hypervolemic hyponatremia, hyperkalemic this admission which is resolved after Lokelma-meds as above seems stabilized  Status post pacemaker placement 2017-MAT noted by Dr. Haroldine Laws yesterday-await EP input-?  Upgrade CRT PPM  lower extremity cellulitis-wounds reviewed at bedside 12/2 and 12/3-seems more stasis-discontinued Keflex 12/3 AM-replace Unna boots  Posterior STEMI DES 07/2015-hypotensive therefore no Toprol-XL 50 Aldactone 25 digoxin 0.125-continue aspirin 81 daily Meds deferred to cardiology  Ethanol habituation-discontinue CIWA patient is not withdrawing  Depression-long chat with wife-has been drinking 7-10 drinks a day "self medicates"-I have changed the patient over from Effexor which she has been on forever to Prozac 10 as it is a little bit activating and may help him get out of  His "rut"  D/w Wife on  phone, eliquis, inpatient but may be able to discharge as early as a.m.   Subjective:  Doing fair No new complaints Asking when he can go home No chest pain no fever  Consultants:   AHF team  Procedures:   Echo this admission EF 15-20%  Antimicrobials:   Keflex continue 12/2  Objective: Vitals:   12/22/18 0650 12/22/18 0651 12/22/18 0729 12/22/18 1141  BP: 115/69  100/64   Pulse: (!) 117  96   Resp: (!) 21     Temp:  98.5 F (36.9 C)  (!) 97.5 F (36.4 C)  TempSrc:  Oral  Oral  SpO2: 100%     Weight:      Height:        Intake/Output Summary (Last 24 hours) at 12/22/2018 1559 Last data filed at 12/22/2018 1329 Gross per 24 hour  Intake 696.38 ml  Output 1100 ml  Net -403.62 ml   Filed Weights   12/20/18 0649 12/21/18 0527 12/22/18 0116  Weight: 123.6 kg 124.4 kg 124.7 kg    Examination: Awake alert No distress but was sleeping and aroused him S1-S2 still has a regular heart rhythm on monitors cannot seem to determine if this is completely only sinus but does have PVCs Chest clinically clear no rales Lower extremities are soft slightly swollen but improved from prior Neurologically intact  Scheduled Meds: . aspirin EC  81 mg Oral Daily  . atorvastatin  80 mg Oral q1800  . Chlorhexidine Gluconate Cloth  6 each Topical Daily  . folic acid  1 mg Oral Daily  . heparin  5,000 Units Subcutaneous Q8H  . multivitamin with minerals  1 tablet Oral Daily  . sodium chloride flush  10-40 mL Intracatheter Q12H  . sodium chloride flush  3  mL Intravenous Q12H  . sodium chloride flush  3 mL Intravenous Q12H  . thiamine  100 mg Oral Daily   Or  . thiamine  100 mg Intravenous Daily  . ticagrelor  90 mg Oral BID  . venlafaxine  75 mg Oral Daily   Continuous Infusions: . sodium chloride    . sodium chloride       LOS: 4 days 40 40 Time spent: [place], MD [facility]  12/22/2018, 3:59 PM

## 2018-12-22 NOTE — Progress Notes (Signed)
Physical Therapy Treatment Patient Details Name: Leonard [name] MRN: [id number] DOB: 10-17-1945 Today's Date: 12/22/2018    History of Present Illness 73 y.o. male with medical history significant for coronary artery disease, ischemic cardiomyopathy with EF 15 to 20%, chronic kidney disease stage [name], and excessive daily alcohol use, now presenting to the emergency department for evaluation of generalized weakness and inability to stand.  Pitting edema bil LEs with weeping serous fluid.  Pt has now been changed to inpatient status and had HF team consult.  Pt receiving lasix, milirone drip, PICC placed and CVP monitoring.  Note that pt has now agreed to CIR at d/c.    PT Comments    Patient seen for mobility progression. Pt is in bed upon arrival eager to participate in therapy. Pt requires mod A for bed mobility and max A +2 for functional transfer training including stand pivot EOB to recliner. Pt demonstrates impaired balance with heavy posterior bias, impaired cognition, and tremors. Vitals monitored during session--see in general comments below. Pt will continue to benefit from further skilled PT services to maximize independence and safety with mobility.    Follow Up Recommendations  CIR     Equipment Recommendations  None recommended by PT    Recommendations for Other Services       Precautions / Restrictions Precautions Precautions: Fall Precaution Comments: watch BP and HR Restrictions Weight Bearing Restrictions: No    Mobility  Bed Mobility Overal bed mobility: Needs Assistance Bed Mobility: Supine to Sit     Supine to sit: Mod assist;HOB elevated     General bed mobility comments: increased time, pulled up on therapist's hand and needed assist with bed pad to pull L hip to EOB  Transfers Overall transfer level: Needs assistance Equipment used: Rolling walker (2 wheeled) Transfers: Sit to/from [facility] Sit to Stand: +2 physical  assistance;Max assist;From elevated surface Stand pivot transfers: Max assist;+2 physical assistance;+2 safety/equipment       General transfer comment: pt pulling up on stablized walker, blocked feet from slipping assist to rise and steady, pt with strong posterior lean and stabilizing LEs on bed; assistance required for sequencing, managing RW, and balance to pivot to recliner   Ambulation/Gait                 Stairs             Wheelchair Mobility    Modified Rankin (Stroke Patients Only)       Balance Overall balance assessment: Needs assistance   Sitting balance-Leahy Scale: Poor Sitting balance - Comments: posterior lean with one LOB     Standing balance-Leahy Scale: Zero                              Cognition Arousal/Alertness: Awake/alert Behavior During Therapy: Flat affect Overall Cognitive Status: Impaired/Different from baseline Area of Impairment: Orientation;Memory;Following commands;Safety/judgement;Problem solving;Awareness;Attention                 Orientation Level: Disoriented to;Time;Situation Current Attention Level: Sustained Memory: Decreased short-term memory Following Commands: Follows one step commands with increased time;Follows one step commands inconsistently Safety/Judgement: Decreased awareness of safety;Decreased awareness of deficits Awareness: Intellectual Problem Solving: Slow processing;Decreased initiation;Difficulty sequencing;Requires verbal cues;Requires tactile cues General Comments: pt repeating himself throughout session      Exercises      General Comments General comments (skin integrity, edema, etc.): SpO2 >92% on RA and no SOB, HR  106-126 bpm during session, BP 66/57 (61) up in chair and pt asymptomatic      Pertinent Vitals/Pain Pain Assessment: Faces Faces Pain Scale: Hurts little more Pain Location: feet  Pain Descriptors / Indicators: Sore;Grimacing;Guarding Pain  Intervention(s): Monitored during session;Repositioned    Home Living                      Prior Function            PT Goals (current goals can now be found in the care plan section) Acute Rehab PT Goals Patient Stated Goal: to get stronger in rehab Progress towards PT goals: Progressing toward goals    Frequency    Min 3X/week      PT Plan Current plan remains appropriate    Co-evaluation PT/OT/SLP Co-Evaluation/Treatment: Yes Reason for Co-Treatment: For patient/therapist safety;To address functional/ADL transfers PT goals addressed during session: Mobility/safety with mobility        AM-PAC PT "6 Clicks" Mobility   Outcome Measure  Help needed turning from your back to your side while in a flat bed without using bedrails?: A Lot Help needed moving from lying on your back to sitting on the side of a flat bed without using bedrails?: A Lot Help needed moving to and from a bed to a chair (including a wheelchair)?: A Lot Help needed standing up from a chair using your arms (e.g., wheelchair or bedside chair)?: A Lot Help needed to walk in hospital room?: Total Help needed climbing 3-5 steps with a railing? : Total 6 Click Score: 10    End of Session Equipment Utilized During Treatment: Gait belt Activity Tolerance: Patient tolerated treatment well Patient left: in chair;with call bell/phone within reach;with chair alarm set Nurse Communication: Mobility status PT Visit Diagnosis: Unsteadiness on feet (R26.81);Other abnormalities of gait and mobility (R26.89);Muscle weakness (generalized) (M62.81)     Time: 6073-7106 PT Time Calculation (min) (ACUTE ONLY): 38 min  Charges:  $Gait Training: 23-37 mins                     Earney Navy, PTA Acute Rehabilitation Services Pager: [phone] Office: [phone]     Darliss Cheney 12/22/2018, 5:04 PM

## 2018-12-22 NOTE — Progress Notes (Addendum)
Advanced Heart Failure Rounding Note  PCP-Cardiologist: Glenetta Hew, MD  AHF: Dr. Haroldine Laws   Subjective:    Milrinone 0.125 mcg added 11/30 due to low CO-OX.   CO-OX stable 61% on milrinone 0.125 mcg. Yesterday torsemide, dig, entresto held due to elevated creatinine.   Denies SOB.     Objective:   Weight Range: 124.7 kg Body mass index is 35.78 kg/m.   Vital Signs:   Temp:  [97.4 F (36.3 C)-98.5 F (36.9 C)] 98.5 F (36.9 C) (12/03 0651) Pulse Rate:  [43-133] 116 (12/03 0649) Resp:  [11-43] 23 (12/03 0649) BP: (73-133)/(33-111) 87/61 (12/03 0643) SpO2:  [64 %-100 %] 100 % (12/03 0649) Weight:  [124.7 kg] 124.7 kg (12/03 0116) Last BM Date: 12/20/18  Weight change: Filed Weights   12/20/18 0649 12/21/18 0527 12/22/18 0116  Weight: 123.6 kg 124.4 kg 124.7 kg    Intake/Output:   Intake/Output Summary (Last 24 hours) at 12/22/2018 0709 Last data filed at 12/22/2018 0553 Gross per 24 hour  Intake 836.38 ml  Output 450 ml  Net 386.38 ml      Physical Exam  CVP 6-7 personally checked.   General:  Obese male lying in bed . No resp difficulty HEENT: normal Neck: supple. JVP 7 Carotids 2+ bilat; no bruits. No lymphadenopathy or thryomegaly appreciated. Cor: PMI nondisplaced. Tachy Irregular rate & rhythm. No rubs, gallops or murmurs. Lungs: clear no wheeze Abdomen: sobese oft, nontender, nondistended. No hepatosplenomegaly. No bruits or masses. Good bowel sounds. Red rash under pannus  Extremities: no cyanosis, clubbing, rash, R and LLE dressings intact. Multiple open sores. RUE PICC  Neuro: alert & oriented x 3, cranial nerves grossly intact. moves all 4 extremities w/o difficulty. Affect pleasant   Telemetry   Sinus Tach 1110-120s vs MAT Personally reviewed   EKG    No new EKG to review  Labs    CBC Recent Labs    12/21/18 0425 12/22/18 0512  WBC 11.9* 11.7*  NEUTROABS 9.1* 9.0*  HGB 14.9 14.7  HCT 45.3 45.1  MCV 86.9 87.4  PLT 274  924   Basic Metabolic Panel Recent Labs    12/21/18 0425 12/22/18 0512  NA 129* 131*  K 3.8 3.6  CL 86* 91*  CO2 26 25  GLUCOSE 101* 98  BUN 44* 47*  CREATININE 2.04* 2.12*  CALCIUM 8.7* 8.4*   Liver Function Tests No results for input(s): AST, ALT, ALKPHOS, BILITOT, PROT, ALBUMIN in the last 72 hours. No results for input(s): LIPASE, AMYLASE in the last 72 hours. Cardiac Enzymes No results for input(s): CKTOTAL, CKMB, CKMBINDEX, TROPONINI in the last 72 hours.  BNP: BNP (last 3 results) Recent Labs    12/18/18 0456  BNP 2,328.4*    ProBNP (last 3 results) No results for input(s): PROBNP in the last 8760 hours.   D-Dimer No results for input(s): DDIMER in the last 72 hours. Hemoglobin A1C No results for input(s): HGBA1C in the last 72 hours. Fasting Lipid Panel No results for input(s): CHOL, HDL, LDLCALC, TRIG, CHOLHDL, LDLDIRECT in the last 72 hours. Thyroid Function Tests No results for input(s): TSH, T4TOTAL, T3FREE, THYROIDAB in the last 72 hours.  Invalid input(s): FREET3  Other results:   Imaging    No results found.   Medications:     Scheduled Medications: . aspirin EC  81 mg Oral Daily  . atorvastatin  80 mg Oral q1800  . cephALEXin  500 mg Oral Q12H  . Chlorhexidine Gluconate Cloth  6 each Topical Daily  . folic acid  1 mg Oral Daily  . heparin  5,000 Units Subcutaneous Q8H  . multivitamin with minerals  1 tablet Oral Daily  . sodium chloride flush  10-40 mL Intracatheter Q12H  . sodium chloride flush  3 mL Intravenous Q12H  . sodium chloride flush  3 mL Intravenous Q12H  . thiamine  100 mg Oral Daily   Or  . thiamine  100 mg Intravenous Daily  . ticagrelor  90 mg Oral BID  . venlafaxine  75 mg Oral Daily    Infusions: . sodium chloride    . sodium chloride    . milrinone 0.125 mcg/kg/min (12/21/18 1935)    PRN Medications: sodium chloride, sodium chloride, acetaminophen, ondansetron (ZOFRAN) IV, sodium chloride flush, sodium  chloride flush, sodium chloride flush    Patient Profile   73 y/o male with multiple medical issues including ischemic CM with severe LV dysfunction with EF 15-20%. Has had severe HF for several years with low output on RHC in 2018. Admitted for a/c systolic HF w/ marked volume overload and bilateral LE cellulitis.   Assessment/Plan    1. 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  Admitted w/ NYHA IIIb symptoms and massive volume overload. BNP 2328.  ECHO this admit EF 10-15% RV moderately entarged. RA/LA severely dilated.  Has [employer] Jude ICD . Has LBBB will ask EP consider upgrade to CRT-D  Milrinone started due to low CO-OX and to assist with diuresis.  CO-OX stable 61% on milrinone 0.125 mcg. Stop milrinone and see if that helps with tachycardia. Add 5 mg twice a day of corlanor.  CVP 6-7  Continue to hold diuretics. Weight unchanged.  - Will hold digoxin and Entresto today given rise in SCr to 2.0 - BP too soft for Bidil  - No  blocker due to low output - No SGLt2 with chronic rash under his pannus.   2. AKI: baseline SCr ~1.3. SCr 1.8 on admit in the setting of a/c CHF w/ volume overload. 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  Suspect elevated with overdiuresis.  1.86>>1.77>>1.73>>2.0>> 2.1  - Continue to hold digoxin and Entresto today.  -  BMET in am.   3. Bilateral LE Cellulitis:  - on Keflex 500 bid per IM  - WBC trending down. Remains afebrile   4. Hyponatremia: - Sodium trending up to 131  -Watch closely.  - likely 2/2 hypervolemia.   5. CAD: hx of STEMI 2017 s/p DES LAD, non-obs dz at cath 2018 -No chest pain.  - Continue medical therapy: ASA, Brilinta and statin. No  blocker due to low output.   6. LBBB  Back in 2019 Dr Caryl Comes had planned to upgrade to CRT-D. We will need to follow up with EP.  QRS 178 ms   7. Deconditioning CIR consulted.   Length of Stay: 4  Amy Clegg, NP  12/22/2018, 7:09 AM  Advanced Heart Failure  Team Pager [phone] (M-F; 7a - 4p)  Please contact [facility] for night-coverage after hours (4p -7a ) and weekends on [email or web address]  Patient seen and examined with the above-signed Advanced Practice Provider and/or Housestaff. I personally reviewed laboratory data, imaging studies and relevant notes. I independently examined the patient and formulated the important aspects of the plan. I have edited the note to reflect any of my changes or salient points. I have personally discussed the plan with the patient and/or family.  Volumes status ok. Co-ox stable. Creatinine climbing slightly  but starting to level out. Would continue to hold IV lasix. Can stop milrinone.   Suspect rhythm may be MAT. Await EP input. (I d/w them this am). Would hold off on Corlanor as this may precipitate AF. Will eventually need CRT upgrade but need to wait until leg wounds healed.   May be ok for CIR tomorrow if renal function stable and HR better controlled.   Glori Bickers, MD  3:05 PM

## 2018-12-22 NOTE — Evaluation (Signed)
Occupational Therapy Evaluation Patient Details Name: Leonard [name] MRN: [id number] DOB: 1945/05/07 Today's Date: 12/22/2018    History of Present Illness 73 y.o. male with medical history significant for coronary artery disease, ischemic cardiomyopathy with EF 15 to 20%, chronic kidney disease stage [name], and excessive daily alcohol use, now presenting to the emergency department for evaluation of generalized weakness and inability to stand.  Pitting edema bil LEs with weeping serous fluid.  Pt has now been changed to inpatient status and had HF team consult.  Pt receiving lasix, milirone drip, PICC placed and CVP monitoring.  Note that pt has now agreed to CIR at d/c.   Clinical Impression   Pt was ambulating with a cane and reports he was independent in self care prior to admission. He was home alone while his wife worked. Pt admits to a sedentary lifestyle. Pt presents with generalized weakness, B UE tremor, impaired cognition, poor balance and needs 2 person max assist for transfers. He demonstrates a strong posterior bias. Pt needs min to total assist for ADL. He will need post acute rehab prior to return home. Will follow acutely.    Follow Up Recommendations  CIR    Equipment Recommendations  Other (comment)(defer to next venue)    Recommendations for Other Services       Precautions / Restrictions Precautions Precautions: Fall Precaution Comments: watch BP and HR Restrictions Weight Bearing Restrictions: No      Mobility Bed Mobility Overal bed mobility: Needs Assistance Bed Mobility: Supine to Sit     Supine to sit: Mod assist;HOB elevated     General bed mobility comments: increased time, pulled up on therapist's hand and needed assist with bed pad to pull L hip to EOB  Transfers Overall transfer level: Needs assistance Equipment used: Rolling walker (2 wheeled) Transfers: Sit to/from Stand Sit to Stand: +2 physical assistance;Max assist;From elevated surface         General transfer comment: pt pulling up on stablized walker, blocked feet from slipping assist to rise and steady, pt with strong posterior lean and stabilizing LEs on bed    Balance Overall balance assessment: Needs assistance   Sitting balance-Leahy Scale: Poor Sitting balance - Comments: posterior lean with one LOB     Standing balance-Leahy Scale: Zero                             ADL either performed or assessed with clinical judgement   ADL Overall ADL's : Needs assistance/impaired Eating/Feeding: Set up;Sitting   Grooming: Oral care;Minimal assistance;Sitting Grooming Details (indicate cue type and reason): cues for sequencing Upper Body Bathing: Moderate assistance;Sitting   Lower Body Bathing: Total assistance;+2 for physical assistance;Sit to/from stand   Upper Body Dressing : Moderate assistance;Sitting   Lower Body Dressing: +2 for  physical assistance;Total assistance;Sit to/from stand Lower Body Dressing Details (indicate cue type and reason): pt does not wear socks typically, uses slip on shoes Toilet Transfer: +2 for physical assistance;Maximal assistance;Stand-pivot;RW;BSC   Toileting- Clothing Manipulation and Hygiene: +2 for physical assistance;Total assistance;Sit to/from stand               Vision Patient Visual Report: No change from baseline       Perception     Praxis      Pertinent Vitals/Pain Pain Assessment: Faces Faces Pain Scale: Hurts little more Pain Location: feet  Pain Descriptors / Indicators: Sore;Grimacing;Guarding Pain Intervention(s): Repositioned;Monitored during session     Hand Dominance Right   Extremity/Trunk Assessment Upper Extremity Assessment Upper Extremity Assessment: Generalized weakness(+ B tremor)   Lower Extremity Assessment Lower Extremity Assessment: Defer to PT evaluation       Communication Communication Communication: Expressive difficulties(low volume)   Cognition Arousal/Alertness: Awake/alert Behavior During Therapy: Flat affect Overall Cognitive Status: Impaired/Different from baseline Area of Impairment: Orientation;Memory;Following commands;Safety/judgement;Problem solving;Awareness;Attention                 Orientation Level: Disoriented to;Time;Situation Current Attention Level: Sustained Memory: Decreased short-term memory Following Commands: Follows one step commands with increased time;Follows one step commands inconsistently Safety/Judgement: Decreased awareness of safety;Decreased awareness of deficits Awareness: Intellectual Problem Solving: Slow processing;Decreased initiation;Difficulty sequencing;Requires verbal cues;Requires tactile cues General Comments: pt repeating himself throughout session   General Comments       Exercises     Shoulder Instructions      Home Living Family/patient expects to be discharged  to:: Private residence Living Arrangements: Spouse/significant other Available Help at Discharge: Family;Available PRN/intermittently Type of Home: House Home Access: Stairs to enter [employer] of Steps: 3 Entrance Stairs-Rails: Right;Left;Can reach both Home Layout: One level     Bathroom Shower/Tub: Teacher, early years/pre: Standard     Home Equipment: Cane - single point          Prior Functioning/Environment Level of Independence: Needs assistance  Gait / Transfers Assistance Needed: walking with a cane ADL's / Homemaking Assistance Needed: reports he could perform all ADLs and stood in shower   Comments: 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        OT Problem List: Decreased strength;Decreased activity tolerance;Impaired balance (sitting and/or standing);Decreased coordination;Decreased cognition;Decreased knowledge of use of DME or AE;Decreased safety awareness;Pain  OT Treatment/Interventions: Self-care/ADL training;DME and/or AE instruction;Therapeutic activities;Cognitive remediation/compensation;Patient/family education;Balance training;Therapeutic exercise    OT Goals(Current goals can be found in the care plan section) Acute Rehab OT Goals Patient Stated Goal: to get stronger in rehab OT Goal Formulation: With patient Time For Goal Achievement: 01/05/19 Potential to Achieve Goals: Good ADL Goals Pt Will Perform Grooming: with set-up;with supervision;sitting(at sink) Pt Will Perform Upper Body Dressing: with set-up;with supervision;sitting Pt Will Perform Lower Body Dressing: with mod assist;sit to/from stand Pt Will Transfer to Toilet: with mod assist;ambulating;bedside commode Pt Will Perform Toileting - Clothing Manipulation and hygiene: with mod assist;sit to/from stand Pt/caregiver will Perform Home Exercise Program: Increased strength;Both right and left upper extremity;With minimal assist Additional ADL Goal #1: Pt will follow one step commands  within 5 seconds of request with 75% accuracy.  OT Frequency: Min 2X/week   Barriers to D/C:            Co-evaluation PT/OT/SLP Co-Evaluation/Treatment: Yes Reason for Co-Treatment: For patient/therapist safety   OT goals addressed during session: ADL's and self-care;Proper use of Adaptive equipment and DME      AM-PAC OT "6 Clicks" Daily Activity     Outcome Measure Help from another person eating meals?: A Little Help from another person taking care of personal grooming?: A Little Help from another person toileting, which includes using toliet, bedpan, or urinal?: Total Help from another person bathing (including washing, rinsing, drying)?: A Lot Help from another person to put on and taking off regular upper body clothing?: A Lot Help from another person to put on and taking off regular lower body clothing?: Total 6 Click Score: 12   End of Session Equipment Utilized During Treatment: Gait belt;Rolling walker Nurse Communication: Mobility status;Other (comment)(BP and HR)  Activity Tolerance: Patient tolerated treatment well Patient left: in chair;with call bell/phone within reach;with chair alarm set  OT Visit Diagnosis: Unsteadiness on feet (R26.81);Muscle weakness (generalized) (M62.81);Other symptoms and signs involving cognitive function;Pain                Time: 2440-1027 OT Time Calculation (min): 66 min Charges:     Nestor Lewandowsky, OTR/L Acute Rehabilitation Services Pager: [phone] Office: [phone]  Malka So 12/22/2018, 11:41 AM

## 2018-12-22 NOTE — Progress Notes (Signed)
Inpatient Rehab Admissions Coordinator:   Was able to speak with [name] over the phone.  She is hopeful for CIR for pt, and states that as long as he is at least supervision level he will have 24/7 support at home.  Will plan to follow for timing of possible admission pending medical readiness and bed availability.   Shann Medal, PT, DPT Admissions Coordinator [phone] 12/22/18  12:29 PM

## 2018-12-22 NOTE — Progress Notes (Signed)
Patient having red MEWS score throughout day for low BP and high HR. MD aware. Medications adjusted. Will continue to monitor.

## 2018-12-22 NOTE — Consult Note (Signed)
[facility] Nurse Consult Note: Reason for Consult: Routine Unna's Boot changes, due tomorrow.  Bedside RN is requested to have Unna's Boots changed today instead. Two 4-inch coban self adherent wraps are requested to the bedside.  [employer] will change. Treatment plan for moisture associated skin damage, specifically intertriginous dermatitis in place, patient using InterDry moisture wicking fabric in abdominal skin fold. Wound type: Venous insufficiency (LEs), MASD/ITD   WOC nursing team will not follow, but will remain available to this patient, the nursing and medical teams.  Please re-consult if needed. Thanks, Maudie Flakes, MSN, RN, [place], Arther Abbott  Pager# [phone]

## 2018-12-23 ENCOUNTER — Inpatient Hospital Stay (HOSPITAL_COMMUNITY)
Admission: RE | Admit: 2018-12-23 | Discharge: 2019-01-01 | DRG: 945 | Disposition: A | Discharge status: Home Health | Payer: Medicare Other | Source: Intra-hospital | Attending: Physical Medicine and Rehabilitation | Admitting: Physical Medicine and Rehabilitation

## 2018-12-23 ENCOUNTER — Encounter: Payer: Medicare Other | Admitting: Family Medicine

## 2018-12-23 DIAGNOSIS — Z882 Allergy status to sulfonamides status: Secondary | ICD-10-CM | POA: Diagnosis not present

## 2018-12-23 DIAGNOSIS — N179 Acute kidney failure, unspecified: Secondary | ICD-10-CM | POA: Diagnosis not present

## 2018-12-23 DIAGNOSIS — I447 Left bundle-branch block, unspecified: Secondary | ICD-10-CM | POA: Diagnosis present

## 2018-12-23 DIAGNOSIS — Z7982 Long term (current) use of aspirin: Secondary | ICD-10-CM | POA: Diagnosis not present

## 2018-12-23 DIAGNOSIS — Z7902 Long term (current) use of antithrombotics/antiplatelets: Secondary | ICD-10-CM | POA: Diagnosis not present

## 2018-12-23 DIAGNOSIS — I959 Hypotension, unspecified: Secondary | ICD-10-CM | POA: Diagnosis present

## 2018-12-23 DIAGNOSIS — Z515 Encounter for palliative care: Secondary | ICD-10-CM

## 2018-12-23 DIAGNOSIS — R5381 Other malaise: Secondary | ICD-10-CM | POA: Diagnosis present

## 2018-12-23 DIAGNOSIS — E785 Hyperlipidemia, unspecified: Secondary | ICD-10-CM | POA: Diagnosis present

## 2018-12-23 DIAGNOSIS — Z833 Family history of diabetes mellitus: Secondary | ICD-10-CM

## 2018-12-23 DIAGNOSIS — R531 Weakness: Secondary | ICD-10-CM | POA: Diagnosis not present

## 2018-12-23 DIAGNOSIS — I5042 Chronic combined systolic (congestive) and diastolic (congestive) heart failure: Secondary | ICD-10-CM | POA: Diagnosis present

## 2018-12-23 DIAGNOSIS — N183 Chronic kidney disease, stage 3 unspecified: Secondary | ICD-10-CM | POA: Diagnosis present

## 2018-12-23 DIAGNOSIS — Z91013 Allergy to seafood: Secondary | ICD-10-CM | POA: Diagnosis not present

## 2018-12-23 DIAGNOSIS — I255 Ischemic cardiomyopathy: Secondary | ICD-10-CM

## 2018-12-23 DIAGNOSIS — I2510 Atherosclerotic heart disease of native coronary artery without angina pectoris: Secondary | ICD-10-CM | POA: Diagnosis present

## 2018-12-23 DIAGNOSIS — Z79899 Other long term (current) drug therapy: Secondary | ICD-10-CM

## 2018-12-23 DIAGNOSIS — I252 Old myocardial infarction: Secondary | ICD-10-CM | POA: Diagnosis not present

## 2018-12-23 DIAGNOSIS — Z8249 Family history of ischemic heart disease and other diseases of the circulatory system: Secondary | ICD-10-CM

## 2018-12-23 DIAGNOSIS — N1832 Chronic kidney disease, stage 3b: Secondary | ICD-10-CM | POA: Diagnosis not present

## 2018-12-23 DIAGNOSIS — I509 Heart failure, unspecified: Secondary | ICD-10-CM | POA: Diagnosis not present

## 2018-12-23 DIAGNOSIS — I130 Hypertensive heart and chronic kidney disease with heart failure and stage 1 through stage 4 chronic kidney disease, or unspecified chronic kidney disease: Secondary | ICD-10-CM | POA: Diagnosis present

## 2018-12-23 DIAGNOSIS — Z955 Presence of coronary angioplasty implant and graft: Secondary | ICD-10-CM | POA: Diagnosis not present

## 2018-12-23 DIAGNOSIS — E871 Hypo-osmolality and hyponatremia: Secondary | ICD-10-CM | POA: Diagnosis not present

## 2018-12-23 DIAGNOSIS — Z95810 Presence of automatic (implantable) cardiac defibrillator: Secondary | ICD-10-CM

## 2018-12-23 DIAGNOSIS — Z7189 Other specified counseling: Secondary | ICD-10-CM

## 2018-12-23 LAB — CBC
HCT: 49.1 % (ref 39.0–52.0)
Hemoglobin: 16 g/dL (ref 13.0–17.0)
MCH: 28.4 pg (ref 26.0–34.0)
MCHC: 32.6 g/dL (ref 30.0–36.0)
MCV: 87.1 fL (ref 80.0–100.0)
Platelets: 298 10*3/uL (ref 150–400)
RBC: 5.64 MIL/uL (ref 4.22–5.81)
RDW: 15.5 % (ref 11.5–15.5)
WBC: 11.4 10*3/uL — ABNORMAL HIGH (ref 4.0–10.5)
nRBC: 0 % (ref 0.0–0.2)

## 2018-12-23 LAB — CBC WITH DIFFERENTIAL/PLATELET
Abs Immature Granulocytes: 0.05 10*3/uL (ref 0.00–0.07)
Basophils Absolute: 0.1 10*3/uL (ref 0.0–0.1)
Basophils Relative: 1 %
Eosinophils Absolute: 0.3 10*3/uL (ref 0.0–0.5)
Eosinophils Relative: 3 %
HCT: 47.2 % (ref 39.0–52.0)
Hemoglobin: 15.1 g/dL (ref 13.0–17.0)
Immature Granulocytes: 1 %
Lymphocytes Relative: 15 %
Lymphs Abs: 1.6 10*3/uL (ref 0.7–4.0)
MCH: 28.3 pg (ref 26.0–34.0)
MCHC: 32 g/dL (ref 30.0–36.0)
MCV: 88.6 fL (ref 80.0–100.0)
Monocytes Absolute: 0.9 10*3/uL (ref 0.1–1.0)
Monocytes Relative: 8 %
Neutro Abs: 8.2 10*3/uL — ABNORMAL HIGH (ref 1.7–7.7)
Neutrophils Relative %: 72 %
Platelets: 298 10*3/uL (ref 150–400)
RBC: 5.33 MIL/uL (ref 4.22–5.81)
RDW: 15.8 % — ABNORMAL HIGH (ref 11.5–15.5)
WBC: 11.1 10*3/uL — ABNORMAL HIGH (ref 4.0–10.5)
nRBC: 0 % (ref 0.0–0.2)

## 2018-12-23 LAB — BASIC METABOLIC PANEL
Anion gap: 15 (ref 5–15)
BUN: 42 mg/dL — ABNORMAL HIGH (ref 8–23)
CO2: 25 mmol/L (ref 22–32)
Calcium: 9.1 mg/dL (ref 8.9–10.3)
Chloride: 89 mmol/L — ABNORMAL LOW (ref 98–111)
Creatinine, Ser: 1.9 mg/dL — ABNORMAL HIGH (ref 0.61–1.24)
GFR calc Af Amer: 39 mL/min — ABNORMAL LOW (ref >60)
GFR calc non Af Amer: 34 mL/min — ABNORMAL LOW (ref >60)
Glucose, Bld: 89 mg/dL (ref 70–99)
Potassium: 3.8 mmol/L (ref 3.5–5.1)
Sodium: 129 mmol/L — ABNORMAL LOW (ref 135–145)

## 2018-12-23 LAB — CREATININE, SERUM
Creatinine, Ser: 1.81 mg/dL — ABNORMAL HIGH (ref 0.61–1.24)
GFR calc Af Amer: 41 mL/min — ABNORMAL LOW (ref >60)
GFR calc non Af Amer: 36 mL/min — ABNORMAL LOW (ref >60)

## 2018-12-23 LAB — COOXEMETRY PANEL
Carboxyhemoglobin: 1.6 % — ABNORMAL HIGH (ref 0.5–1.5)
Methemoglobin: 0.9 % (ref 0.0–1.5)
O2 Saturation: 57 %
Total hemoglobin: 14.7 g/dL (ref 12.0–16.0)

## 2018-12-23 MED ORDER — HEPARIN SODIUM (PORCINE) 5000 UNIT/ML IJ SOLN: 5000.0000 [IU] | Freq: Three times a day (TID) | INTRAMUSCULAR | Status: DC

## 2018-12-23 MED ORDER — LIVING BETTER WITH HEART FAILURE BOOK: Freq: Once | Status: DC

## 2018-12-23 MED ORDER — AMIODARONE HCL 200 MG PO TABS
200.0000 mg | ORAL_TABLET | Freq: Two times a day (BID) | ORAL | Status: DC
  Administered 2018-12-23 – 2019-01-01 (×18): 200 mg via ORAL
  Filled 2018-12-23 (×17): qty 1

## 2018-12-23 MED ORDER — HEPARIN SODIUM (PORCINE) 5000 UNIT/ML IJ SOLN
5000.0000 [IU] | Freq: Three times a day (TID) | INTRAMUSCULAR | Status: DC
  Administered 2018-12-23: 19:00:00 5000 [IU] via SUBCUTANEOUS
  Filled 2018-12-23 (×2): qty 1

## 2018-12-23 MED ORDER — ACETAMINOPHEN 325 MG PO TABS
650.0000 mg | ORAL_TABLET | ORAL | Status: DC | PRN
  Administered 2018-12-24: 16:00:00 650 mg via ORAL
  Filled 2018-12-23 (×3): qty 2

## 2018-12-23 MED ORDER — AMIODARONE HCL 200 MG PO TABS: 200.0000 mg | ORAL_TABLET | Freq: Two times a day (BID) | ORAL | Status: DC

## 2018-12-23 MED ORDER — TICAGRELOR 90 MG PO TABS
90.0000 mg | ORAL_TABLET | Freq: Two times a day (BID) | ORAL | Status: DC
  Administered 2018-12-23 – 2019-01-01 (×18): 90 mg via ORAL
  Filled 2018-12-23 (×18): qty 1

## 2018-12-23 MED ORDER — AMIODARONE HCL 200 MG PO TABS
200.0000 mg | ORAL_TABLET | Freq: Two times a day (BID) | ORAL | Status: DC
  Administered 2018-12-23: 200 mg via ORAL
  Filled 2018-12-23: qty 1

## 2018-12-23 MED ORDER — FLUOXETINE HCL 10 MG PO CAPS: 10.0000 mg | ORAL_CAPSULE | Freq: Every day | ORAL | 0 refills | Status: DC

## 2018-12-23 MED ORDER — FLUOXETINE HCL 10 MG PO CAPS
10.0000 mg | ORAL_CAPSULE | Freq: Every day | ORAL | Status: DC
  Administered 2018-12-24 – 2019-01-01 (×9): 10 mg via ORAL
  Filled 2018-12-23 (×9): qty 1

## 2018-12-23 MED ORDER — SORBITOL 70 % SOLN: 30.0000 mL | Freq: Every day | Status: DC | PRN

## 2018-12-23 MED ORDER — ASPIRIN EC 81 MG PO TBEC
81.0000 mg | DELAYED_RELEASE_TABLET | Freq: Every day | ORAL | Status: DC
  Administered 2018-12-24 – 2019-01-01 (×9): 81 mg via ORAL
  Filled 2018-12-23 (×9): qty 1

## 2018-12-23 MED ORDER — ATORVASTATIN CALCIUM 80 MG PO TABS
80.0000 mg | ORAL_TABLET | Freq: Every day | ORAL | Status: DC
  Administered 2018-12-23 – 2018-12-31 (×9): 80 mg via ORAL
  Filled 2018-12-23 (×9): qty 1

## 2018-12-23 NOTE — Progress Notes (Deleted)
Late entry due to missed charges.

## 2018-12-23 NOTE — Progress Notes (Addendum)
Inpatient Rehab Admissions Coordinator:   I have approval from Dr. Verlon Au for pt to d/c to CIR today and a bed available. Will let pt/family and CM know.  Shann Medal, PT, DPT Admissions Coordinator [phone] 12/23/18  12:53 PM

## 2018-12-23 NOTE — H&P (Signed)
Physical Medicine and Rehabilitation Admission H&P    Chief Complaint  Patient presents with  . Weakness  : HPI: Leonard [name] is a 73 year old right-handed male with history of CAD/myocardial infarction//PTCA/Saint Jude ICD, left bundle branch block, ischemic cardiomyopathy ejection fraction of 15 to 20%, CKD stage [name], lower extremity venous stasis changes , as well as alcohol use.  Per chart review and patient, patient lives with spouse. 1 level home 3 steps to entry.  Walks with a cane prior to admission.  He was able to perform his own ADLs.  Presented 12/17/2018 with generalized weakness and hypotensive. Chemistries upon admission to the ED showed a sodium 133, potassium 5.3, BUN 41, creatinine 1.18, SARS coronavirus negative, WBC 14,800, urinalysis negative nitrite, CK 119.  Cranial CT unremarkable for acute changes. Chest x-ray hazy interstitial opacities and central cuffing consistent with interstitial edema.  No pleural effusion.  Cardiology services consulted for acute on chronic systolic congestive heart failure.  Initially maintained on milrinone for blood pressure.  A follow-up echo completed, showing EF of 15-20%. His digoxin and Entresto were held due to elevated creatinine with latest creatinine 1.90.  Patient currently remains on aspirin as well as Brilinta.  Noted stasis changes lower extremities versus cellulitis and placed on Keflex for coverage.  Unna boots were added for lower extremity ischemic changes.  Subcutaneous heparin for DVT prophylaxis.  Tolerating a regular diet with fluid restrictions.  Therapy evaluations completed and patient was admitted for a comprehensive rehab program. Please see preadmission assessment from earlier today as well.  Review of Systems  Constitutional: Negative for chills and fever.  HENT: Negative for hearing loss.   Eyes: Negative for blurred vision and double vision.  Respiratory: Positive for shortness of breath.   Cardiovascular:  Positive for leg swelling.  Gastrointestinal: Positive for constipation. Negative for heartburn, nausea and vomiting.  Genitourinary: Negative for dysuria, flank pain and hematuria.  Musculoskeletal: Positive for joint pain and myalgias.  Skin: Negative for rash.  Neurological: Positive for weakness.  Psychiatric/Behavioral: The patient has insomnia.        Anxiety   Past Medical History:  Diagnosis Date  . AICD (automatic cardioverter/defibrillator) present 08/26/2015   STJ  . Anxiety   . Atherosclerotic heart disease of native coronary artery with angina pectoris ([place]) 07/2015   a. 07/2015 Posterior STEMI/PCI: LM nl, LAd 40ost, RI 40, RCA 152m(3.0x22 Resolute Integrity DES distal, 3.0x12 Resolute Integrity DES prox), 90d (PTCA), RPDA small, nl, RPLB2 100 - too small for PTCA. -- patent in f/u cath 08/2015 & 05/2016 - patent RCA stents (~10%), RI 40%, LM ~30% ost LAD ~40%.  . Chronic combined systolic and diastolic CHF (congestive heart failure) [place] 07/[place]   a. 07/2015 Ehco: EF 45-50%, basal-mid inf and infsept AK, inflat, apical inf, and apical septal HK, Gr1 DD, triv MR.;; b - Echo 05/2016 -> EF 20-25%, Gr 2 DD.  Severe diffuse HK disproportionately severe HK and thinning of apex, anterior septum and anterior wall-with septal dyssynchrony.  PAP ~53 mmHg.  .Marland KitchenHyperlipidemia with target low density lipoprotein (LDL) cholesterol less than 70 mg/dL   . Hypertensive heart disease   . Ischemic cardiomyopathy 07/2015 - 05/2016   a. 07/2015 Echo: EF 45-50%.; Follow-up Echo 08/23/2015: EF 50-55% with mild inferior hypokinesis --> 05/2016: EF 20-25%, diffuse HK. PAP ~53 mmHg.  . Kidney disease, chronic, stage [name] (GFR 30-59 ml/min) 08/12/2015  . Morbid obesity ([place]   . ST elevation myocardial infarction (STEMI) of  inferior wall ([place]) 08/12/2015   Subacute presentation for inferior MI with post infarction angina on the following day. EKG still shows injury current. Therefore decided to proceed with  intervention of 100% mRCA thrombotic lesion.  . Sustained ventricular fibrillation ([place]) 08/22/2015   Underwent EP evaluation with final ICD implantation.   Past Surgical History:  Procedure Laterality Date  . CARDIAC CATHETERIZATION N/A 08/12/2015   Procedure: Left Heart Cath and Coronary Angiography;  Surgeon: Leonie Man, MD;  Location: [facility];  Service: Cardiovascular: Inferior STEMI: 100% mid RCA, 90% distal RCA, 100% RPL. 40% ostial and proximal LAD and branch of ramus.  [name] CARDIAC CATHETERIZATION N/A 08/26/2015   Procedure: Left Heart Cath and Coronary Angiography;  Surgeon: Belva Crome, MD;  Location: [facility];  Service: Cardiovascular: To evaluate sustained VT. Widely patent RCA stent and distal PTCA site. Also patent RPL branch. LAD lesion appeared to be more consistent with 50-55% and 40%. Otherwise stable from previous cath. EF estimated 35-45%.  [name] CARDIOVERSION N/A 08/23/2015   Procedure: CARDIOVERSION;  Surgeon: Deboraha Sprang, MD;  Location: [facility];  Service: Cardiovascular;  Laterality: N/A;  . CORONARY ANGIOPLASTY WITH STENT PLACEMENT  08/12/2015   Mid RCA 100% reduced to 0% with 2 overlapping Resolute DES.  3.0 x 22 mm with a 3.0 x 12 mm prox overlarp (postdilated to 3.4 mm); PTCA of dRCA 90%. (Very dificult,complex case - tortuous, [place] RCA - unable to advance longer stents.  RPL2 noted to have thromboembolic 597% occlusion - unable to reach.  . ELECTROPHYSIOLOGIC STUDY N/A 08/26/2015   Procedure: Electrophysiology Study;  Surgeon: Deboraha Sprang, MD;  Location: [facility];  Service: Cardiovascular;  Laterality: N/A;  . EP IMPLANTABLE DEVICE N/A 08/26/2015   Procedure: ICD Implant;  Surgeon: Deboraha Sprang, MD;  Location: [facility];  Service: Cardiovascular;  Laterality: LEFT:  St Jude ICD, serial number [id number].   [name] HEMORRHOID SURGERY    . RIGHT/LEFT HEART CATH AND CORONARY ANGIOGRAPHY N/A 06/10/2016   Procedure: Right/Left Heart  Cath and Coronary Angiography;  Surgeon: Martinique, Peter M, MD;  Location: [facility] INVASIVE CV LAB:  Nonobstructive CAD. RCA stents PATENT.  Ost-Prox RCA, 10 %. Ost LM 30 %. Ost LAD 40 %. RI, 40 %; High PCWP  & LVEDP (23 mmHg) ; PAP 43/23/31 mmHg. CO/CI 3.89 / 1/5 (Severely Reduced)  . SKIN SURGERY    . TRANSTHORACIC ECHOCARDIOGRAM  08/13/2015; 08/23/2015   a. Mild concentric LVH. EF 45-50%. Basal-mid inferior and inferoseptal akinesis with hypokinesis of inferolateral and apical inferior wall. 1 DD. ;; b. Technically difficult study. Definity contrast administered.  Compared to a prior echo in 07/2015, the LVEF is slightly higher at 50-55% with mild inferior hypokinesis.  . TRANSTHORACIC ECHOCARDIOGRAM  06/09/2016   EF  20-25%, GR 2 DD. Severe diffuse  hypokinesis with distinct regional wall motion abnormalities.   There is disproportionately severe hypokinesis and thinning of the apex, anterior septum and anterior wall (septal dyssynchrony). However, there is severe dyssynchrony , making it difficult to assess regional function.  PA P ~53 mmHg  . TRANSTHORACIC ECHOCARDIOGRAM  01/2017   Severely reduced EF.  GI to DD.  Moderate MR.  Mild to have a moderately elevated PAP (peak 89mHg)   Family History  Problem Relation Age of Onset  . Diabetes Mother   . Heart disease Paternal Grandmother   . Heart disease Paternal Grandfather    Social History:  reports  that he has never smoked. He has never used smokeless tobacco. He reports current alcohol use of about 3.0 - 4.0 standard drinks of alcohol per week. He reports that he does not use drugs. Allergies:  Allergies  Allergen Reactions  . Clams [Shellfish Allergy] Shortness Of Breath, Itching and Swelling  . Shellfish-Derived Products Shortness Of Breath, Itching and Swelling  . Other Itching and Other (See Comments)    Ragweed causes watery eyes, itching, sneezing  . Sulfa Antibiotics Other (See Comments)    Pt unsure of reaction, mother told him he was  allergic    Medications Prior to Admission  Medication Sig Dispense Refill  . acetaminophen (TYLENOL) 500 MG tablet Take 500-1,000 mg by mouth every 6 (six) hours as needed for mild pain or headache.    [name] aspirin EC 81 MG tablet Take 1 tablet (81 mg total) by mouth daily.    [name] atorvastatin (LIPITOR) 80 MG tablet TAKE 1 TABLET BY MOUTH DAILY AT 6 PM; MAKE ANNUAL APPOINTMENT WITH DOCTOR KLEIN FOR FUTURE REFILLS (Patient taking differently: Take 80 mg by mouth daily at 6 PM. ) 90 tablet 3  . BRILINTA 90 MG TABS tablet TAKE 1 TABLET BY MOUTH TWICE DAILY(NEEDS OFFICE VISIT FOR REFILLS) (Patient taking differently: Take 90 mg by mouth 2 (two) times daily. ) 180 tablet 2  . cephALEXin (KEFLEX) 500 MG capsule Take 1 capsule (500 mg total) by mouth 2 (two) times daily. 14 capsule 0  . Coenzyme Q10 (COQ10) 200 MG CAPS Take 200 mg by mouth daily.    . digoxin (LANOXIN) 0.125 MG tablet TAKE 1 TABLET BY MOUTH DAILY (Patient taking differently: Take 0.125 mg by mouth daily at 6 PM. ) 90 tablet 2  . MAGNESIUM-OXIDE 400 (241.3 Mg) MG tablet TAKE 1 TABLET BY MOUTH TWICE DAILY (Patient taking differently: Take 400 mg by mouth 2 (two) times daily. ) 60 tablet 3  . metoprolol succinate (TOPROL-XL) 50 MG 24 hr tablet TAKE 1 TABLET BY MOUTH DAILY (Patient taking differently: Take 50 mg by mouth daily. ) 90 tablet 1  . Multiple Vitamin (MULTIVITAMIN) tablet Take 1 tablet by mouth daily.    . nitroGLYCERIN (NITROSTAT) 0.4 MG SL tablet Place 1 tablet (0.4 mg total) under the tongue every 5 (five) minutes as needed for chest pain. 25 tablet 2  . Omega-3 Fatty Acids (FISH OIL) 1200 MG CPDR Take 1,200 mg by mouth daily.     . sacubitril-valsartan (ENTRESTO) 49-51 MG Take 1 tablet by mouth 2 (two) times daily. 180 tablet 3  . spironolactone (ALDACTONE) 25 MG tablet Take 1 tablet (25 mg total) by mouth daily. (Patient taking differently: Take 12.5 mg by mouth daily. ) 90 tablet 2  . torsemide (DEMADEX) 20 MG tablet Take 2  tablets (40 mg total) by mouth 2 (two) times daily. every other day (Patient taking differently: Take 80 mg by mouth every other day. ) 360 tablet 1  . venlafaxine (EFFEXOR) 75 MG tablet TAKE 1 TABLET BY MOUTH EVERY DAY (Patient taking differently: Take 75 mg by mouth daily. ) 30 tablet 2  . Cholecalciferol (VITAMIN D) 2000 units CAPS Take 1 Units by mouth daily.     . Investigational - Study Medication Take 1 tablet by mouth 2 (two) times daily. Study name: Galactic HF Study Additional study details: Omecamtiv Mecarbil or Placebo (Patient not taking: Reported on 12/17/2018) 14 each PRN    Drug Regimen Review Drug regimen was reviewed and remains appropriate with no significant  issues identified  Home: Home Living Family/patient expects to be discharged to:: Private residence Living Arrangements: Spouse/significant other Available Help at Discharge: Family, Available PRN/intermittently Type of Home: House Home Access: Stairs to enter [employer] of Steps: 3 Entrance Stairs-Rails: Right, Left, Can reach both Home Layout: One level Bathroom Shower/Tub: Chiropodist: Standard Bathroom Accessibility: Yes Home Equipment: Cane - single point   Functional History: Prior Function Level of Independence: Needs assistance Gait / Transfers Assistance Needed: walking with a cane ADL's / Homemaking Assistance Needed: reports he could perform all ADLs and stood in shower Comments: 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  Functional Status:  Mobility: Bed Mobility Overal bed mobility: Needs Assistance Bed Mobility: Supine to Sit Supine to sit: Mod assist, HOB elevated Sit to supine: Mod assist, +2 for physical assistance General bed mobility comments: increased time, pulled up on therapist's hand and needed assist with bed pad to pull L hip to EOB Transfers Overall transfer level: Needs assistance Equipment used: Rolling walker (2 wheeled) Transfers: Sit to/from Stand, Stand Pivot  Transfers Sit to Stand: +2 physical assistance, Max assist, From elevated surface Stand pivot transfers: Max assist, +2 physical assistance, +2 safety/equipment General transfer comment: pt pulling up on stablized walker, blocked feet from slipping assist to rise and steady, pt with strong posterior lean and stabilizing LEs on bed; assistance required for sequencing, managing RW, and balance to pivot to recliner  Ambulation/Gait Ambulation/Gait assistance: Max assist Gait Distance (Feet): 2 Feet Assistive device: Rolling walker (2 wheeled) Gait Pattern/deviations: Decreased stride length, Shuffle General Gait Details: 2'x2:  side steps to recliner, rested, then steps back to bed.  Pt very unsteady, posterior lean, requiring max A to maintain balance, fatiguing easily, and abruptly sitting in chair and bed Gait velocity: .64f/sec Gait velocity interpretation: <1.8 ft/sec, indicate of risk for recurrent falls    ADL: ADL Overall ADL's : Needs assistance/impaired Eating/Feeding: Set up, Sitting Grooming: Oral care, Minimal assistance, Sitting Grooming Details (indicate cue type and reason): cues for sequencing Upper Body Bathing: Moderate assistance, Sitting Lower Body Bathing: Total assistance, +2 for physical assistance, Sit to/from stand Upper Body Dressing : Moderate assistance, Sitting Lower Body Dressing: +2 for physical assistance, Total assistance, Sit to/from stand Lower Body Dressing Details (indicate cue type and reason): pt does not wear socks typically, uses slip on shoes Toilet Transfer: +2 for physical assistance, Maximal assistance, Stand-pivot, RW, BSC Toileting- Clothing Manipulation and Hygiene: +2 for physical assistance, Total assistance, Sit to/from stand  Cognition: Cognition Overall Cognitive Status: Impaired/Different from baseline Orientation Level: Oriented X4 Cognition Arousal/Alertness: Awake/alert Behavior During Therapy: Flat affect Overall Cognitive  Status: Impaired/Different from baseline Area of Impairment: Orientation, Memory, Following commands, Safety/judgement, Problem solving, Awareness, Attention Orientation Level: Disoriented to, Time, Situation Current Attention Level: Sustained Memory: Decreased short-term memory Following Commands: Follows one step commands with increased time, Follows one step commands inconsistently Safety/Judgement: Decreased awareness of safety, Decreased awareness of deficits Awareness: Intellectual Problem Solving: Slow processing, Decreased initiation, Difficulty sequencing, Requires verbal cues, Requires tactile cues General Comments: pt repeating himself throughout session  Physical Exam: Blood pressure 98/74, pulse 97, temperature 98.2 F (36.8 C), temperature source Oral, resp. rate (!) 23, height 6' 1.5" (1.867 m), weight 122.8 kg, SpO2 94 %. Physical Exam  Vitals reviewed. Constitutional: He appears well-developed and well-nourished.  HENT:  Head: Normocephalic and atraumatic.  Eyes: EOM are normal. Right eye exhibits no discharge. Left eye exhibits no discharge.  Neck: No tracheal deviation present. No thyromegaly present.  Respiratory: Effort normal. No respiratory distress.  GI: He exhibits no distension.  Musculoskeletal:     Comments: Some edema in b/l feet  Neurological: He is alert.  He does make good eye contact with examiner.   He was a bit slow to process but was able to provide his name age in place.  He was however limited medical historian. Motor: B/l UE 4-4+/5 proximal to distal B/l LE: HF, KE 4/5, ADF limited due to unna boots  Skin:  Bilateral Unna boots lower extremities Ulcer on dorsal left second digit  Psychiatric: He has a normal mood and affect. His behavior is normal.    Results for orders placed or performed during the hospital encounter of 12/17/18 (from the past 48 hour(s))  Basic metabolic panel     Status: Abnormal   Collection Time: 12/22/18  5:12 AM   Result Value Ref Range   Sodium 131 (L) 135 - 145 mmol/L   Potassium 3.6 3.5 - 5.1 mmol/L   Chloride 91 (L) 98 - 111 mmol/L   CO2 25 22 - 32 mmol/L   Glucose, Bld 98 70 - 99 mg/dL   BUN 47 (H) 8 - 23 mg/dL   Creatinine, Ser 2.12 (H) 0.61 - 1.24 mg/dL   Calcium 8.4 (L) 8.9 - 10.3 mg/dL   GFR calc non Af Amer 30 (L) >60 mL/min   GFR calc Af Amer 34 (L) >60 mL/min   Anion gap 15 5 - 15    Comment: Performed at [facility], [address] [place], [place] [address]  CBC with Differential/Platelet     Status: Abnormal   Collection Time: 12/22/18  5:12 AM  Result Value Ref Range   WBC 11.7 (H) 4.0 - 10.5 K/uL   RBC 5.16 4.22 - 5.81 MIL/uL   Hemoglobin 14.7 13.0 - 17.0 g/dL   HCT 45.1 39.0 - 52.0 %   MCV 87.4 80.0 - 100.0 fL   MCH 28.5 26.0 - 34.0 pg   MCHC 32.6 30.0 - 36.0 g/dL   RDW 15.7 (H) 11.5 - 15.5 %   Platelets 285 150 - 400 K/uL   nRBC 0.0 0.0 - 0.2 %   Neutrophils Relative % 77 %   Neutro Abs 9.0 (H) 1.7 - 7.7 K/uL   Lymphocytes Relative 12 %   Lymphs Abs 1.4 0.7 - 4.0 K/uL   Monocytes Relative 8 %   Monocytes Absolute 0.9 0.1 - 1.0 K/uL   Eosinophils Relative 3 %   Eosinophils Absolute 0.3 0.0 - 0.5 K/uL   Basophils Relative 0 %   Basophils Absolute 0.0 0.0 - 0.1 K/uL   Immature Granulocytes 0 %   Abs Immature Granulocytes 0.04 0.00 - 0.07 K/uL    Comment: Performed at [facility], [address], [place], [place] [address]  .Cooxemetry Panel (carboxy, met, total hgb, O2 sat)     Status: Abnormal   Collection Time: 12/22/18  5:12 AM  Result Value Ref Range   Total hemoglobin 15.4 12.0 - 16.0 g/dL   O2 Saturation 61.4 %   Carboxyhemoglobin 1.6 (H) 0.5 - 1.5 %   Methemoglobin 0.8 0.0 - 1.5 %    Comment: Performed at [facility] [address] [place], [place] [address]  CBC with Differential/Platelet     Status: Abnormal   Collection Time: 12/23/18  4:30 AM  Result Value Ref Range   WBC 11.1 (H) 4.0 - 10.5 K/uL   RBC 5.33 4.22 -  5.81  MIL/uL   Hemoglobin 15.1 13.0 - 17.0 g/dL   HCT 47.2 39.0 - 52.0 %   MCV 88.6 80.0 - 100.0 fL   MCH 28.3 26.0 - 34.0 pg   MCHC 32.0 30.0 - 36.0 g/dL   RDW 15.8 (H) 11.5 - 15.5 %   Platelets 298 150 - 400 K/uL   nRBC 0.0 0.0 - 0.2 %   Neutrophils Relative % 72 %   Neutro Abs 8.2 (H) 1.7 - 7.7 K/uL   Lymphocytes Relative 15 %   Lymphs Abs 1.6 0.7 - 4.0 K/uL   Monocytes Relative 8 %   Monocytes Absolute 0.9 0.1 - 1.0 K/uL   Eosinophils Relative 3 %   Eosinophils Absolute 0.3 0.0 - 0.5 K/uL   Basophils Relative 1 %   Basophils Absolute 0.1 0.0 - 0.1 K/uL   Immature Granulocytes 1 %   Abs Immature Granulocytes 0.05 0.00 - 0.07 K/uL    Comment: Performed at [facility] [address], [place], [place] [address]  .Cooxemetry Panel (carboxy, met, total hgb, O2 sat)     Status: Abnormal   Collection Time: 12/23/18  4:30 AM  Result Value Ref Range   Total hemoglobin 14.7 12.0 - 16.0 g/dL   O2 Saturation 57.0 %   Carboxyhemoglobin 1.6 (H) 0.5 - 1.5 %   Methemoglobin 0.9 0.0 - 1.5 %    Comment: Performed at [facility] [address], [place], [place] [address]  Basic metabolic panel     Status: Abnormal   Collection Time: 12/23/18  4:30 AM  Result Value Ref Range   Sodium 129 (L) 135 - 145 mmol/L   Potassium 3.8 3.5 - 5.1 mmol/L   Chloride 89 (L) 98 - 111 mmol/L   CO2 25 22 - 32 mmol/L   Glucose, Bld 89 70 - 99 mg/dL   BUN 42 (H) 8 - 23 mg/dL   Creatinine, Ser 1.90 (H) 0.61 - 1.24 mg/dL   Calcium 9.1 8.9 - 10.3 mg/dL   GFR calc non Af Amer 34 (L) >60 mL/min   GFR calc Af Amer 39 (L) >60 mL/min   Anion gap 15 5 - 15    Comment: Performed at [facility] [address] [place], [place] [address]   No results found.   Medical Problem List and Plan: 1.  Debility secondary to acute on chronic systolic congestive heart failure with history of CAD, ischemic cardiomyopathy ejection fraction 15 to 20%  -patient may may not shower  -ELOS/Goals: 14-17 days.  Supervision/Min A  Admit to CIR 2.  Antithrombotics: -DVT/anticoagulation: Subcutaneous heparin  -antiplatelet therapy: Aspirin 81 mg daily, Brilinta 90 mg twice daily 3. Pain Management: Tylenol as needed 4. Mood: Prozac 10 mg daily  -antipsychotic agents: N/A 5. Neuropsych: This patient is capable of making decisions on his own behalf. 6. Skin/Wound Care: Routine skin checks/ UNNA boots as directed 7. Fluids/Electrolytes/Nutrition: Routine in and outs. CMP ordered.  8.  CKD stage [name].  Labs ordered. 9.  Hypotension.  Digoxin and Entresto on hold as well as Bidil with follow-up per cardiology services.  Monitor with increased mobility.  10.  Left bundle branch block.  Patient currently on amiodarone 200 mg twice daily.  Follow-up in EP lab Dr. Caryl Comes 11.  Hyperlipidemia.  Lipitor 12. Combined CHF  Daily weights  Cathlyn Parsons, PA-C 12/23/2018  I have personally performed a face to face diagnostic evaluation, including, but not limited to relevant  history and physical exam findings, of this patient and developed relevant assessment and plan.  Additionally, I have reviewed and concur with the physician assistant's documentation above.  Delice Lesch, MD, ABPMR  The patient's status has not changed. The original post admission physician evaluation remains appropriate, and any changes from the pre-admission screening or documentation from the acute chart are noted above.   Delice Lesch, MD, ABPMR

## 2018-12-23 NOTE — Progress Notes (Signed)
Patient admitted to 4M07 from 3E12. Patient is A&Ox4, complains of 0 pain, VSS. Patient oriented to unit, room, and rehab fall  and visitor policy.

## 2018-12-23 NOTE — Discharge Summary (Signed)
Physician Discharge Summary  Leonard [name] KWI:[id number] DOB: 1945/08/27 DOA: 12/17/2018  PCP: Girtha Rm, NP-C  Admit date: 12/17/2018 Discharge date: 12/23/2018  Time spent: 45 minutes  Recommendations for Outpatient Follow-up:  1. Cardiology will follow patient on CIR and adjust medications 2. New medication amiodarone 200 twice daily suggest TSH LFT and may be cutting back dose in 2 to 3 weeks or as per cardiology In 2 weeks- 3. discharge weight 122 kgcareful ins and outs and maintain 1500 ccFluid intake Discharge Diagnoses:  Principal Problem:   Generalized weakness Active Problems:   Essential hypertension   CKD (chronic kidney disease), stage [name]   Ischemic cardiomyopathy   Chronic combined systolic and diastolic CHF (congestive heart failure) (HCC)   Coronary artery disease involving native coronary artery with angina pectoris (HCC)   Excessive drinking of alcohol   Lower extremity cellulitis   Pressure injury of skin   Palliative care by specialist   DNR (do not resuscitate) discussion   Discharge Condition: Improved  Diet recommendation: Heart healthy  Filed Weights   12/21/18 0527 12/22/18 0116 12/23/18 0435  Weight: 124.4 kg 124.7 kg 122.8 kg    History of present illness:  73 white male posterior STEMI DES-RCA 07/2015 NYHA class II systolic and diastolic heart failure, -single-chamber ICD placed at that time-EF 15 to 20% % NSVT without shocks, VF h/o, chronic LBBB Ethanol habituation 3/day  Presented to the hospital 11/28 after 2 to 3 weeks of marked volume overload inadequate diuresis despite adjustment of torsemide and Aldactone in outpatient setting-found to be volume overloaded but hypotensive in the ED--found to have weeping lower extremities  Cardiology saw the patient and the heart failure team has been managing patient on inotropes  Hospital Course:  NYHA class IIIb heart failure (last check weight 132 kg 12/05/2018 last check) in the  setting of ischemic cardiomyopathy + PPM-deferring to cardiology management Current meds-atorvastatin Brilinta aspirin --5.4 for admission L, weight 122 kg,   CKD 3B-4 with hypervolemic hyponatremia, hyperkalemic this admission which is resolved after Lokelma-meds as above seems stabilized-potassium 3.8 see above discussion  Status post pacemaker placement 2017-MAT noted by Dr. Haroldine Laws yesterday-amiodarone was started at 200 twice daily and will need outpatient follow-up with regards to management of the same with EP Dr. Caryl Comes and or Dr. Haroldine Laws  lower extremity cellulitis with history of contact dermatitis.-wounds reviewed at bedside 12/2 and 12/3-seems more stasis-discontinued Keflex 12/3 AM-replace Unna boots Consider Eucerin cream in the outpatient setting for contact dermatitis  Posterior STEMI DES 07/2015-hypotensive therefore no Toprol-XL 50 Aldactone 25 digoxin 0.125-continue aspirin 81 daily Meds deferred to cardiology  Ethanol habituation-discontinue CIWA patient is not withdrawing  [place] chat with wife-has been drinking 7-10 drinks a day "self medicates"-I have changed the patient over from Effexor which she has been on forever to Prozac 10 as it is a little bit activating and may help him get out of  His "rut"  Procedures: Sonographer Comments: Patient movement. IMPRESSIONS    1. Left ventricular ejection fraction, by visual estimation, is 15-20%%. The left ventricle has normal function. Left ventricular septal wall thickness was mildly increased. Mildly increased left ventricular posterior wall thickness. There is mildly  increased left ventricular hypertrophy.  2. Mildly dilated left ventricular internal cavity size.  3. The left ventricle demonstrates global hypokinesis.  4. Global right ventricle has mildly reduced systolic function.The right ventricular size is moderately enlarged. No increase in right ventricular wall thickness.  5. Left atrial size  was severely  dilated.  6. Right atrial size was severely dilated.  7. Moderate pleural effusion in the left lateral region.  8. Moderate mitral annular calcification.  9. The mitral valve is normal in structure. Mild mitral valve regurgitation. No evidence of mitral stenosis. 10. The tricuspid valve is normal in structure. Tricuspid valve regurgitation moderate. 11. The aortic valve is normal in structure. Aortic valve regurgitation is not visualized. No evidence of aortic valve sclerosis or stenosis. 12. There is Moderate sclerosis of the aortic valve. 13. There is Moderate thickening of the aortic valve. 14. The pulmonic valve was normal in structure. Pulmonic valve regurgitation is not visualized. 15. There is mild dilatation of the ascending aorta measuring 41 mm. 16. Moderately elevated pulmonary artery systolic pressure. 17. A pacer wire is visualized. 18. The inferior vena cava is dilated in size with <50% respiratory variability, suggesting right atrial pressure of 15 mmHg. Consultations:  Cardiology  Discharge Exam: Vitals:   12/23/18 0436 12/23/18 0750  BP: 103/73 98/74  Pulse: (!) 104 97  Resp:  (!) 23  Temp: 98 F (36.7 C) 98.2 F (36.8 C)  SpO2:  94%    General: Awake alert coherent no distress EOMI NCAT no focal deficit feels well no new issues eating drinking Cardiovascular: S1-S2 irregularly regular rhythm Respiratory: Clear no added sound no rales Rhonchi Abdomen has contact dermatitis with excoriations on upper thighs in addition Unna boots are on do not feel swollen Wounds are edematous Neurologically intact moving around  Discharge Instructions   Discharge Instructions    Diet - low sodium heart healthy   Complete by: As directed    Increase activity slowly   Complete by: As directed    dc   Complete by: As directed    Discharge disposition: 02-Transferred to [facility]   Discharge patient date: 12/23/2018     Allergies as of 12/23/2018       Reactions   Clams [shellfish Allergy] Shortness Of Breath, Itching, Swelling   Shellfish-derived Products Shortness Of Breath, Itching, Swelling   Other Itching, Other (See Comments)   Ragweed causes watery eyes, itching, sneezing   Sulfa Antibiotics Other (See Comments)   Pt unsure of reaction, mother told him he was allergic       Medication List    STOP taking these medications   cephALEXin 500 MG capsule Commonly known as: Keflex   digoxin 0.125 MG tablet Commonly known as: LANOXIN   Entresto 49-51 MG Generic drug: sacubitril-valsartan   Fish Oil 1200 MG Cpdr   Investigational - Study Medication   MAGnesium-Oxide 400 (241.3 Mg) MG tablet Generic drug: magnesium oxide   metoprolol succinate 50 MG 24 hr tablet Commonly known as: TOPROL-XL   multivitamin tablet   nitroGLYCERIN 0.4 MG SL tablet Commonly known as: NITROSTAT   spironolactone 25 MG tablet Commonly known as: ALDACTONE   torsemide 20 MG tablet Commonly known as: DEMADEX   venlafaxine 75 MG tablet Commonly known as: EFFEXOR     TAKE these medications   acetaminophen 500 MG tablet Commonly known as: TYLENOL Take 500-1,000 mg by mouth every 6 (six) hours as needed for mild pain or headache.   amiodarone 200 MG tablet Commonly known as: PACERONE Take 1 tablet (200 mg total) by mouth 2 (two) times daily.   aspirin EC 81 MG tablet Take 1 tablet (81 mg total) by mouth daily.   atorvastatin 80 MG tablet Commonly known as: LIPITOR TAKE 1 TABLET BY MOUTH DAILY AT 6 PM; MAKE  [place] FUTURE REFILLS What changed:   how much to take  how to take this  when to take this  additional instructions   Brilinta 90 MG Tabs tablet Generic drug: ticagrelor TAKE 1 TABLET BY MOUTH TWICE DAILY(NEEDS OFFICE VISIT FOR REFILLS) What changed: See the new instructions.   CoQ10 200 MG Caps Take 200 mg by mouth daily.   FLUoxetine 10 MG capsule Commonly known as:  PROZAC Take 1 capsule (10 mg total) by mouth daily. Start taking on: December 24, 2018   Vitamin D 50 MCG (2000 UT) Caps Take 1 Units by mouth daily.      Allergies  Allergen Reactions  . Clams [Shellfish Allergy] Shortness Of Breath, Itching and Swelling  . Shellfish-Derived Products Shortness Of Breath, Itching and Swelling  . Other Itching and Other (See Comments)    Ragweed causes watery eyes, itching, sneezing  . Sulfa Antibiotics Other (See Comments)    Pt unsure of reaction, mother told him he was allergic       The results of significant diagnostics from this hospitalization (including imaging, microbiology, ancillary and laboratory) are listed below for reference.    Significant Diagnostic Studies: Dg Chest 2 View  Result Date: 12/17/2018 CLINICAL DATA:  Weakness EXAM: CHEST - 2 VIEW COMPARISON:  Radiograph Jun 09, 2016 FINDINGS: Hazy interstitial opacities cardiomegaly with cephalized vascularity and central cuffing. No pneumothorax. No visible effusion. AICD battery pack overlies the left chest wall with leads at the cardiac apex. The aorta is calcified. The remaining cardiomediastinal contours are unremarkable. No acute osseous or soft tissue abnormality. IMPRESSION: 1. Hazy interstitial opacities and central cuffing, consistent with interstitial edema. 2. Cardiomegaly.  No pleural effusion. Electronically Signed   By: Lovena Le M.D.   On: 12/17/2018 20:54   Ct Head Wo Contrast  Result Date: 12/17/2018 CLINICAL DATA:  73 year old male with altered mental status. EXAM: CT HEAD WITHOUT CONTRAST TECHNIQUE: Contiguous axial images were obtained from the base of the skull through the vertex without intravenous contrast. COMPARISON:  None. FINDINGS: Brain: There is mild age-related atrophy and chronic microvascular ischemic changes. There is no acute intracranial hemorrhage. No mass effect or midline shift. No extra-axial fluid collection. Vascular: No hyperdense vessel or  unexpected calcification. Skull: Normal. Negative for fracture or focal lesion. Sinuses/Orbits: Partial opacification of several ethmoid air cells. No air-fluid level. The mastoid air cells are clear. Other: None IMPRESSION: 1. No acute intracranial hemorrhage. 2. Mild age-related atrophy and chronic microvascular ischemic changes. Electronically Signed   By: Anner Crete M.D.   On: 12/17/2018 20:43   [place] Ekg Site Rite  Result Date: 12/18/2018 If Site Rite image not attached, placement could not be confirmed due to current cardiac rhythm.   Microbiology: Recent Results (from the past 240 hour(s))  SARS CORONAVIRUS 2 (TAT 6-24 HRS) Nasopharyngeal Nasopharyngeal Swab     Status: None   Collection Time: 12/17/18  9:40 PM   Specimen: Nasopharyngeal Swab  Result Value Ref Range Status   SARS Coronavirus 2 NEGATIVE NEGATIVE Final    Comment: (NOTE) SARS-CoV-2 target nucleic acids are NOT DETECTED. The SARS-CoV-2 RNA is generally detectable in upper and lower respiratory specimens during the acute phase of infection. Negative results do not preclude SARS-CoV-2 infection, do not rule out co-infections with other pathogens, and should not be used as the sole basis for treatment or other patient management decisions. Negative results must be combined with clinical observations, patient history, and epidemiological information. The  expected result is Negative. Fact Sheet for Patients: SugarRoll.be Fact Sheet for Healthcare Providers: https://www.woods-mathews.com/ This test is not yet approved or cleared by the [place] FDA and  has been authorized for detection and/or diagnosis of SARS-CoV-2 by FDA under an Emergency Use Authorization (EUA). This EUA will remain  in effect (meaning this test can be used) for the duration of the COVID-19 declaration under Section 56 4(b)(1) of the Act, 21 U.S.C. section 360bbb-3(b)(1), unless the authorization  is terminated or revoked sooner. Performed at [facility], [place] [address], [place], [place] [address]   MRSA PCR Screening     Status: None   Collection Time: 12/18/18  9:07 AM   Specimen: Nasal Mucosa; Nasopharyngeal  Result Value Ref Range Status   MRSA by PCR NEGATIVE NEGATIVE Final    Comment:        The GeneXpert MRSA Assay (FDA approved for NASAL specimens only), is one component of a comprehensive MRSA colonization surveillance program. It is not intended to diagnose MRSA infection nor to guide or monitor treatment for MRSA infections. Performed at [facility], [place] [address], [place], [place] [address]      Labs: Basic Metabolic Panel: Recent Labs  Lab 12/18/18 0456 12/19/18 0500 12/20/18 0434 12/21/18 0425 12/22/18 0512 12/23/18 0430  NA 133* 132* 132* 129* 131* 129*  K 5.2* 3.7 3.4* 3.8 3.6 3.8  CL 90* 91* 90* 86* 91* 89*  CO2 26 26 28 26 25 25   GLUCOSE 103* 94 127* 101* 98 89  BUN 42* 37* 34* 44* 47* 42*  CREATININE 1.86* 1.77* 1.73* 2.04* 2.12* 1.90*  CALCIUM 9.5 8.7* 8.9 8.7* 8.4* 9.1  MG 2.3  --   --   --   --   --   PHOS 4.6  --   --   --   --   --    Liver Function Tests: Recent Labs  Lab 12/18/18 0444  AST 22  ALT 13  ALKPHOS 111  BILITOT 2.0*  PROT 6.4*  ALBUMIN 3.1*   No results for input(s): LIPASE, AMYLASE in the last 168 hours. No results for input(s): AMMONIA in the last 168 hours. CBC: Recent Labs  Lab 12/19/18 0500 12/20/18 0434 12/21/18 0425 12/22/18 0512 12/23/18 0430  WBC 13.7* 12.7* 11.9* 11.7* 11.1*  NEUTROABS 11.0* 10.1* 9.1* 9.0* 8.2*  HGB 14.0 14.7 14.9 14.7 15.1  HCT 43.2 45.7 45.3 45.1 47.2  MCV 88.0 88.1 86.9 87.4 88.6  PLT 258 272 274 285 298   Cardiac Enzymes: Recent Labs  Lab 12/18/18 0444  CKTOTAL 119   BNP: BNP (last 3 results) Recent Labs    12/18/18 0456  BNP 2,328.4*    ProBNP (last 3 results) No results for input(s): PROBNP in the last 8760 hours.  CBG: No results for  input(s): GLUCAP in the last 168 hours.     Signed:  Nita Sells MD   [facility] 12/23/2018, 12:47 PM

## 2018-12-23 NOTE — Progress Notes (Signed)
Jamse Arn, MD  Physician  Physical Medicine and Rehabilitation  PMR Pre-admission  Signed  Date of Service:  12/23/2018 12:56 PM      Related encounter: ED to Hosp-Admission (Current) from 12/17/2018 in [place]      Signed         Show:Clear all _0 Manual_1 Template_2 Copied  Added by: _3 Jamse Arn, MD_4 Michel Santee, PT  _5 Hover for details PMR Admission Coordinator Pre-Admission Assessment  Patient: Leonard [name] is an 73 y.o., male MRN: [id number] DOB: [birth year] Height: 6' 1.5" (186.7 cm) Weight: 122.8 kg  Insurance Information HMO:     PPO:      PCP:      IPA:      80/20:      OTHER:  PRIMARY: Medicare A and B      Policy#: [id number]      Subscriber: patient CM Name:       Phone#:      Fax#:  Pre-Cert#: verified Civil engineer, contracting:  Benefits:  Phone #:      Name:  Eff. Date: 01/20/08     Deduct: $1408      Out of Pocket Max: n/a      Life Max: n/a CIR: 100%      SNF: 20 full days Outpatient: 80%     Co-Pay: 20% Home Health: 100%      Co-Pay:  DME: 80%     Co-Pay: 20% Providers: pt choice  SECONDARY: BCBS       Policy#: [id number]       Subscriber:  CM Name:       Phone#:      Fax#:  Pre-Cert#:       Employer:  Benefits:  Phone #:      Name:  Eff. Date:      Deduct:       Out of Pocket Max:       Life Max:  CIR:       SNF:  Outpatient:      Co-Pay:  Home Health:       Co-Pay:  DME:      Co-Pay:   Medicaid Application Date:       Case Manager:  Disability Application Date:       Case Worker:   The Data Collection Information Summary for patients in Inpatient Rehabilitation Facilities with attached Privacy Act [place] Records was provided and verbally reviewed with: Patient  Emergency Contact Information         Contact Information    Name Relation Home Work Mobile   [place] Spouse [phone]        Current Medical History  Patient Admitting Diagnosis:  cardiac debility   History of Present Illness: Leonard Bosserman IIIis a 73 year old right-handed male with history of CAD/myocardial infarction//PTCA/Saint JudeICD,left bundle branch block,ischemic cardiomyopathy ejection fraction of 15 to 20%, CKD stage [name], lower extremity venous stasis changes , as well as alcohol use. Presented 12/17/2018 with generalized weakness as well as hypotensive. Chemistries upon admission to the ED showed a sodium 133, potassium 5.3, BUN 41, creatinine 1.18, SARS coronavirus negative, WBC 14,800, urinalysis negative nitrite, CK 119. Cranial CT scan negative for acute changes. Chest x-ray hazy interstitial opacities and central cuffing consistent with interstitial edema. No pleural effusion. Cardiology services consulted for acute on chronic systolic congestive heart failure.Initially maintained on milrinone for blood pressure. A follow-up echocardiogram completed with ejection fraction of 15 to 20%. His  digoxin and Entresto were held due to elevated creatinine with latest creatinine 1.90. Patient currently remains on aspirin as well as Brilinta. Noted stasis changes lower extremities versus cellulitis and placed on Keflex for coverage.Unna boots were added for lower extremity ischemic changes. Subcutaneous heparin for DVT prophylaxis. Tolerating a regular diet with fluid restrictions. Therapy evaluations completed and patient was recommended for a comprehensive rehab program.  Patient's medical [facility] from [facility] has been reviewed by the rehabilitation admission coordinator and physician.  Past Medical History      Past Medical History:  Diagnosis Date   AICD (automatic cardioverter/defibrillator) present 08/26/2015   STJ   Anxiety    Atherosclerotic heart disease of native coronary artery with angina pectoris ([place]) 07/2015   a. 07/2015 Posterior STEMI/PCI: LM nl, LAd 40ost, RI 40, RCA 120m(3.0x22 Resolute Integrity DES distal,  3.0x12 Resolute Integrity DES prox), 90d (PTCA), RPDA small, nl, RPLB2 100 - too small for PTCA. -- patent in f/u cath 08/2015 & 05/2016 - patent RCA stents (~10%), RI 40%, LM ~30% ost LAD ~40%.   Chronic combined systolic and diastolic CHF (congestive heart failure) ([place] 07/2015   a. 07/2015 Ehco: EF 45-50%, basal-mid inf and infsept AK, inflat, apical inf, and apical septal HK, Gr1 DD, triv MR.;; b - Echo 05/2016 -> EF 20-25%, Gr 2 DD.  Severe diffuse HK disproportionately severe HK and thinning of apex, anterior septum and anterior wall-with septal dyssynchrony.  PAP ~53 mmHg.   Hyperlipidemia with target low density lipoprotein (LDL) cholesterol less than 70 mg/dL    Hypertensive heart disease    Ischemic cardiomyopathy 07/2015 - 05/2016   a. 07/2015 Echo: EF 45-50%.; Follow-up Echo 08/23/2015: EF 50-55% with mild inferior hypokinesis --> 05/2016: EF 20-25%, diffuse HK. PAP ~53 mmHg.   Kidney disease, chronic, stage [name] (GFR 30-59 ml/min) 08/12/2015   Morbid obesity ([facility]    ST elevation myocardial infarction (STEMI) of inferior wall ([facility] 08/12/2015   Subacute presentation for inferior MI with post infarction angina on the following day. EKG still shows injury current. Therefore decided to proceed with intervention of 100% mRCA thrombotic lesion.   Sustained ventricular fibrillation ([facility] [place] 08/22/2015   Underwent EP evaluation with final ICD implantation.    Family History   family history includes Diabetes in his mother; Heart disease in his paternal grandfather and paternal grandmother.  Prior Rehab/Hospitalizations Has the patient had prior rehab or hospitalizations prior to admission? No  Has the patient had major surgery during 100 days prior to admission? No              Current Medications  Current Facility-Administered Medications:    0.9 %  sodium chloride infusion, 250 mL, Intravenous, PRN, Opyd, TIlene Qua MD   acetaminophen (TYLENOL) tablet 650 mg, 650 mg,  Oral, Q4H PRN, Opyd, TIlene Qua MD, 650 mg at 12/20/18 05035  amiodarone (PACERONE) tablet 200 mg, 200 mg, Oral, BID, Clegg, Amy D, NP, 200 mg at 12/23/18 1249   aspirin EC tablet 81 mg, 81 mg, Oral, Daily, Opyd, Timothy S, MD, 81 mg at 12/23/18 1032   atorvastatin (LIPITOR) tablet 80 mg, 80 mg, Oral, q1800, Opyd, TIlene Qua MD, 80 mg at 12/22/18 1740   Chlorhexidine Gluconate Cloth 2 % PADS 6 each, 6 each, Topical, Daily, EAlma Friendly MD, 6 each at 12/22/18 1857   FLUoxetine (PROZAC) capsule 10 mg, 10 mg, Oral, Daily, Samtani, Jai-Gurmukh, MD, 10 mg at 12/23/18 1034   heparin injection 5,000 Units,  5,000 Units, Subcutaneous, Q8H, Alma Friendly, MD, 5,000 Units at 12/23/18 1032   ondansetron (ZOFRAN) injection 4 mg, 4 mg, Intravenous, Q6H PRN, Opyd, Ilene Qua, MD   sodium chloride flush (NS) 0.9 % injection 10-40 mL, 10-40 mL, Intracatheter, Q12H, Alma Friendly, MD, 10 mL at 12/23/18 1033   sodium chloride flush (NS) 0.9 % injection 10-40 mL, 10-40 mL, Intracatheter, PRN, Alma Friendly, MD   sodium chloride flush (NS) 0.9 % injection 3 mL, 3 mL, Intravenous, Q12H, Opyd, Timothy S, MD, 3 mL at 12/23/18 1033   sodium chloride flush (NS) 0.9 % injection 3 mL, 3 mL, Intravenous, PRN, Opyd, Ilene Qua, MD   ticagrelor (BRILINTA) tablet 90 mg, 90 mg, Oral, BID, Opyd, Ilene Qua, MD, 90 mg at 12/23/18 1032  Patients Current Diet:     Diet Order                  Diet - low sodium heart healthy         Diet Heart Room service appropriate? Yes; Fluid consistency: Thin; Fluid restriction: 1500 mL Fluid  Diet effective now               Precautions / Restrictions Precautions Precautions: Fall Precaution Comments: watch BP and HR Restrictions Weight Bearing Restrictions: No   Has the patient had 2 or more falls or a fall with injury in the past year? Yes  Prior Activity Level Limited Community (1-2x/wk): per wife, had been driving her  to/from work daily up until a few months ago when he slowly started to decline, amb without AD at home but was more sedentary   Prior Functional Level Self Care: Did the patient need help bathing, dressing, using the toilet or eating? Independent  Indoor Mobility: Did the patient need assistance with walking from room to room (with or without device)? Independent  Stairs: Did the patient need assistance with internal or external stairs (with or without device)? Independent  Functional Cognition: Did the patient need help planning regular tasks such as shopping or remembering to take medications? [place] / [place] Devices/Equipment: Cane (specify quad or straight)(straight) Home Equipment: Cane - single point  Prior Device Use: Indicate devices/aids used by the patient prior to current illness, exacerbation or injury? cane occasionally  Current Functional Level Cognition  Overall Cognitive Status: Impaired/Different from baseline Current Attention Level: Sustained Orientation Level: Oriented X4 Following Commands: Follows one step commands with increased time, Follows one step commands inconsistently Safety/Judgement: Decreased awareness of safety, Decreased awareness of deficits General Comments: pt repeating himself throughout session    Extremity Assessment (includes Sensation/Coordination)  Upper Extremity Assessment: Generalized weakness(+ B tremor)  Lower Extremity Assessment: Defer to PT evaluation RLE Deficits / Details: ROM WFL; MMT 4+/5; pitting edema, several small sores, and weeping LLE Deficits / Details: ROM WFL; MMT 4+/5; pitting edema, several small sores, and weeping    ADLs  Overall ADL's : Needs assistance/impaired Eating/Feeding: Set up, Sitting Grooming: Oral care, Minimal assistance, Sitting Grooming Details (indicate cue type and reason): cues for sequencing Upper Body Bathing: Moderate assistance,  Sitting Lower Body Bathing: Total assistance, +2 for physical assistance, Sit to/from stand Upper Body Dressing : Moderate assistance, Sitting Lower Body Dressing: +2 for physical assistance, Total assistance, Sit to/from stand Lower Body Dressing Details (indicate cue type and reason): pt does not wear socks typically, uses slip on shoes Toilet Transfer: +2 for physical assistance, Maximal assistance, Stand-pivot, RW, BSC Toileting-  Clothing Manipulation and Hygiene: +2 for physical assistance, Total assistance, Sit to/from stand    Mobility  Overal bed mobility: Needs Assistance Bed Mobility: Supine to Sit Supine to sit: Mod assist, HOB elevated Sit to supine: Mod assist, +2 for physical assistance General bed mobility comments: increased time, pulled up on therapist's hand and needed assist with bed pad to pull L hip to EOB    Transfers  Overall transfer level: Needs assistance Equipment used: Rolling walker (2 wheeled) Transfers: Sit to/from Stand, Stand Pivot Transfers Sit to Stand: +2 physical assistance, Max assist, From elevated surface Stand pivot transfers: Max assist, +2 physical assistance, +2 safety/equipment General transfer comment: pt pulling up on stablized walker, blocked feet from slipping assist to rise and steady, pt with strong posterior lean and stabilizing LEs on bed; assistance required for sequencing, managing RW, and balance to pivot to recliner     Ambulation / Gait / Stairs / Wheelchair Mobility  Ambulation/Gait Ambulation/Gait assistance: Max Web designer (Feet): 2 Feet Assistive device: Rolling walker (2 wheeled) Gait Pattern/deviations: Decreased stride length, Shuffle General Gait Details: 2'x2:  side steps to recliner, rested, then steps back to bed.  Pt very unsteady, posterior lean, requiring max A to maintain balance, fatiguing easily, and abruptly sitting in chair and bed Gait velocity: .37f/sec Gait velocity interpretation: <1.8  ft/sec, indicate of risk for recurrent falls    Posture / Balance Dynamic Sitting Balance Sitting balance - Comments: posterior lean with one LOB Balance Overall balance assessment: Needs assistance Sitting-balance support: Bilateral upper extremity supported, Feet supported Sitting balance-Leahy Scale: Poor Sitting balance - Comments: posterior lean with one LOB Standing balance support: Bilateral upper extremity supported, During functional activity Standing balance-Leahy Scale: Zero    Special needs/care consideration BiPAP/CPAP no CPM no Continuous Drip IV no Dialysis no        Days n/a Life Vest no Oxygen no Special Bed no Trach Size no Wound Vac (area) no      Location n/a Skin   Stage II pressure, undocumented location     Diabetic mgmt: no Behavioral consideration no Chemo/radiation no   Previous Home Environment (from acute therapy documentation) Living Arrangements: Spouse/significant other Available Help at Discharge: Family, Available PRN/intermittently Type of Home: House Home Layout: One level Home Access: Stairs to enter Entrance Stairs-Rails: Right, Left, Can reach both Entrance Stairs-Number of Steps: 3 Bathroom Shower/Tub: TOptometrist Yes Home Care Services: No  Discharge Living Setting Plans for Discharge Living Setting: Patient's home Type of Home at Discharge: House Discharge Home Layout: One level Discharge Home Access: Stairs to enter Entrance Stairs-Rails: Can reach both Entrance Stairs-Number of Steps: 3 Discharge Bathroom Shower/Tub: Tub/shower unit Discharge Bathroom Toilet: Standard Discharge Bathroom Accessibility: Yes How Accessible: Accessible via walker Does the patient have any problems obtaining your medications?: No  Social/Family/Support Systems Patient Roles: Spouse Anticipated Caregiver: KClaiborne Billings(spouse) and hired caregivers Anticipated CAmbulance personInformation:  [phone]/Limitations of Caregiver: works during the day, plans for 24/7 at discharge or SNF if needed Caregiver Availability: Evenings only Discharge Plan Discussed with Primary Caregiver: Yes Is Caregiver In Agreement with Plan?: Yes Does Caregiver/Family have Issues with Lodging/Transportation while Pt is in Rehab?: No  Goals/Additional Needs Patient/Family Goal for Rehab: PT/OT/SLP supervision Expected length of stay: 14-21 days Pt/Family Agrees to Admission and willing to participate: Yes Program Orientation Provided & Reviewed with Pt/Caregiver Including Roles  & Responsibilities: Yes  Decrease burden of Care through IP rehab admission: n/a  Possible need for SNF placement upon discharge: Not anticipated.  Plan for home with 24/7 supervision from family and hired help.    Patient Condition: I have reviewed medical records [facility], spoken with CM, and patient and spouse. I met with patient at the bedside and discussed via phone for inpatient rehabilitation assessment.  Patient will benefit from ongoing PT, OT and SLP, can actively participate in 3 hours of therapy a day 5 days of the week, and can make measurable gains during the admission.  Patient will also benefit from the coordinated team approach during an Inpatient Acute Rehabilitation admission.  The patient will receive intensive therapy as well as Rehabilitation physician, nursing, social worker, and care management interventions.  Due to safety, skin/wound care, disease management, medication administration, pain management and patient education the patient requires 24 hour a day rehabilitation nursing.  The patient is currently max +2 with mobility and basic ADLs.  Discharge setting and therapy post discharge at home is anticipated.  Patient has agreed to participate in the Acute Inpatient Rehabilitation Program and will admit today.  Preadmission Screen Completed By:  Michel Santee, PT, DPT  12/23/2018 12:56 PM ______________________________________________________________________   Discussed status with Dr. Posey Pronto on 12/23/18  at 1:08 PM  and received approval for admission today.  Admission Coordinator:  Michel Santee, PT, DPT time 1:08 PM Sudie Grumbling 12/23/18    Assessment/Plan: Diagnosis: cardiac debility   1. Does the need for close, 24 hr/day Medical supervision in concert with the patient's rehab needs make it unreasonable for this patient to be served in a less intensive setting? Yes  2. Co-Morbidities requiring supervision/potential complications: CAD/myocardial infarction//PTCA/Saint JudeICD,left bundle branch block,ischemic cardiomyopathy ejection fraction of 15 to 20%, CKD stage [name], lower extremity venous stasis changes, alcohol use 3. Due to bladder management, safety, skin/wound care, disease management and patient education, does the patient require 24 hr/day rehab nursing? Yes 4. Does the patient require coordinated care of a physician, rehab nurse, PT, OT, and SLP to address physical and functional deficits in the context of the above medical diagnosis(es)? Yes Addressing deficits in the following areas: balance, endurance, locomotion, strength, transferring, bathing, dressing, toileting and psychosocial support 5. Can the patient actively participate in an intensive therapy program of at least 3 hrs of therapy 5 days a week? Yes 6. The potential for patient to make measurable gains while on inpatient rehab is excellent 7. Anticipated functional outcomes upon discharge from inpatient rehab: supervision and min assist PT, supervision and min assist OT, n/a SLP 8. Estimated rehab length of stay to reach the above functional goals is: 17-20 days. 9. Anticipated discharge destination: Home 10. Overall Rehab/Functional Prognosis: excellent and good   MD Signature: Delice Lesch, MD, ABPMR        Revision History

## 2018-12-23 NOTE — H&P (Signed)
Physical Medicine and Rehabilitation Admission H&P    Chief Complaint  Patient presents with  . Weakness  : HPI: Leonard [name] is a 73 year old right-handed male with history of CAD/myocardial infarction//PTCA/Saint Jude ICD, left bundle branch block, ischemic cardiomyopathy ejection fraction of 15 to 20%, CKD stage [name], lower extremity venous stasis changes , as well as alcohol use.  Per chart review and patient, patient lives with spouse. 1 level home 3 steps to entry.  Walks with a cane prior to admission.  He was able to perform his own ADLs.  Presented 12/17/2018 with generalized weakness and hypotensive. Chemistries upon admission to the ED showed a sodium 133, potassium 5.3, BUN 41, creatinine 1.18, SARS coronavirus negative, WBC 14,800, urinalysis negative nitrite, CK 119.  Cranial CT unremarkable for acute changes. Chest x-ray hazy interstitial opacities and central cuffing consistent with interstitial edema.  No pleural effusion.  Cardiology services consulted for acute on chronic systolic congestive heart failure.  Initially maintained on milrinone for blood pressure.  A follow-up echo completed, showing EF of 15-20%. His digoxin and Entresto were held due to elevated creatinine with latest creatinine 1.90.  Patient currently remains on aspirin as well as Brilinta.  Noted stasis changes lower extremities versus cellulitis and placed on Keflex for coverage.  Unna boots were added for lower extremity ischemic changes.  Subcutaneous heparin for DVT prophylaxis.  Tolerating a regular diet with fluid restrictions.  Therapy evaluations completed and patient was admitted for a comprehensive rehab program. Please see preadmission assessment from earlier today as well.  Review of Systems  Constitutional: Negative for chills and fever.  HENT: Negative for hearing loss.   Eyes: Negative for blurred vision and double vision.  Respiratory: Positive for shortness of breath.   Cardiovascular:  Positive for leg swelling.  Gastrointestinal: Positive for constipation. Negative for heartburn, nausea and vomiting.  Genitourinary: Negative for dysuria, flank pain and hematuria.  Musculoskeletal: Positive for joint pain and myalgias.  Skin: Negative for rash.  Neurological: Positive for weakness.  Psychiatric/Behavioral: The patient has insomnia.        Anxiety   Past Medical History:  Diagnosis Date  . AICD (automatic cardioverter/defibrillator) present 08/26/2015   STJ  . Anxiety   . Atherosclerotic heart disease of native coronary artery with angina pectoris ([place]) 07/2015   a. 07/2015 Posterior STEMI/PCI: LM nl, LAd 40ost, RI 40, RCA 147m(3.0x22 Resolute Integrity DES distal, 3.0x12 Resolute Integrity DES prox), 90d (PTCA), RPDA small, nl, RPLB2 100 - too small for PTCA. -- patent in f/u cath 08/2015 & 05/2016 - patent RCA stents (~10%), RI 40%, LM ~30% ost LAD ~40%.  . Chronic combined systolic and diastolic CHF (congestive heart failure) ([place] 07/2015   a. 07/2015 Ehco: EF 45-50%, basal-mid inf and infsept AK, inflat, apical inf, and apical septal HK, Gr1 DD, triv MR.;; b - Echo 05/2016 -> EF 20-25%, Gr 2 DD.  Severe diffuse HK disproportionately severe HK and thinning of apex, anterior septum and anterior wall-with septal dyssynchrony.  PAP ~53 mmHg.  .Marland KitchenHyperlipidemia with target low density lipoprotein (LDL) cholesterol less than 70 mg/dL   . Hypertensive heart disease   . Ischemic cardiomyopathy 07/2015 - 05/2016   a. 07/2015 Echo: EF 45-50%.; Follow-up Echo 08/23/2015: EF 50-55% with mild inferior hypokinesis --> 05/2016: EF 20-25%, diffuse HK. PAP ~53 mmHg.  . Kidney disease, chronic, stage [name] (GFR 30-59 ml/min) 08/12/2015  . Morbid obesity ([place]   . ST elevation myocardial infarction (STEMI) of  inferior wall ([place]) 08/12/2015   Subacute presentation for inferior MI with post infarction angina on the following day. EKG still shows injury current. Therefore decided to proceed with  intervention of 100% mRCA thrombotic lesion.  . Sustained ventricular fibrillation ([place]) 08/22/2015   Underwent EP evaluation with final ICD implantation.   Past Surgical History:  Procedure Laterality Date  . CARDIAC CATHETERIZATION N/A 08/12/2015   Procedure: Left Heart Cath and Coronary Angiography;  Surgeon: Leonie Man, MD;  Location: [facility];  Service: Cardiovascular: Inferior STEMI: 100% mid RCA, 90% distal RCA, 100% RPL. 40% ostial and proximal LAD and branch of ramus.  [name] CARDIAC CATHETERIZATION N/A 08/26/2015   Procedure: Left Heart Cath and Coronary Angiography;  Surgeon: Belva Crome, MD;  Location: [facility];  Service: Cardiovascular: To evaluate sustained VT. Widely patent RCA stent and distal PTCA site. Also patent RPL branch. LAD lesion appeared to be more consistent with 50-55% and 40%. Otherwise stable from previous cath. EF estimated 35-45%.  [name] CARDIOVERSION N/A 08/23/2015   Procedure: CARDIOVERSION;  Surgeon: Deboraha Sprang, MD;  Location: [facility];  Service: Cardiovascular;  Laterality: N/A;  . CORONARY ANGIOPLASTY WITH STENT PLACEMENT  08/12/2015   Mid RCA 100% reduced to 0% with 2 overlapping Resolute DES.  3.0 x 22 mm with a 3.0 x 12 mm prox overlarp (postdilated to 3.4 mm); PTCA of dRCA 90%. (Very dificult,complex case - tortuous, [place] RCA - unable to advance longer stents.  RPL2 noted to have thromboembolic 121% occlusion - unable to reach.  . ELECTROPHYSIOLOGIC STUDY N/A 08/26/2015   Procedure: Electrophysiology Study;  Surgeon: Deboraha Sprang, MD;  Location: [facility];  Service: Cardiovascular;  Laterality: N/A;  . EP IMPLANTABLE DEVICE N/A 08/26/2015   Procedure: ICD Implant;  Surgeon: Deboraha Sprang, MD;  Location: [facility];  Service: Cardiovascular;  Laterality: LEFT:  St Jude ICD, serial number [id number].   [name] HEMORRHOID SURGERY    . RIGHT/LEFT HEART CATH AND CORONARY ANGIOGRAPHY N/A 06/10/2016   Procedure: Right/Left Heart  Cath and Coronary Angiography;  Surgeon: Martinique, Peter M, MD;  Location: [facility]:  Nonobstructive CAD. RCA stents PATENT.  Ost-Prox RCA, 10 %. Ost LM 30 %. Ost LAD 40 %. RI, 40 %; High PCWP  & LVEDP (23 mmHg) ; PAP 43/23/31 mmHg. CO/CI 3.89 / 1/5 (Severely Reduced)  . SKIN SURGERY    . TRANSTHORACIC ECHOCARDIOGRAM  08/13/2015; 08/23/2015   a. Mild concentric LVH. EF 45-50%. Basal-mid inferior and inferoseptal akinesis with hypokinesis of inferolateral and apical inferior wall. 1 DD. ;; b. Technically difficult study. Definity contrast administered.  Compared to a prior echo in 07/2015, the LVEF is slightly higher at 50-55% with mild inferior hypokinesis.  . TRANSTHORACIC ECHOCARDIOGRAM  06/09/2016   EF  20-25%, GR 2 DD. Severe diffuse  hypokinesis with distinct regional wall motion abnormalities.   There is disproportionately severe hypokinesis and thinning of the apex, anterior septum and anterior wall (septal dyssynchrony). However, there is severe dyssynchrony , making it difficult to assess regional function.  PA P ~53 mmHg  . TRANSTHORACIC ECHOCARDIOGRAM  01/2017   Severely reduced EF.  GI to DD.  Moderate MR.  Mild to have a moderately elevated PAP (peak 23mHg)   Family History  Problem Relation Age of Onset  . Diabetes Mother   . Heart disease Paternal Grandmother   . Heart disease Paternal Grandfather    Social History:  reports  that he has never smoked. He has never used smokeless tobacco. He reports current alcohol use of about 3.0 - 4.0 standard drinks of alcohol per week. He reports that he does not use drugs. Allergies:  Allergies  Allergen Reactions  . Clams [Shellfish Allergy] Shortness Of Breath, Itching and Swelling  . Shellfish-Derived Products Shortness Of Breath, Itching and Swelling  . Other Itching and Other (See Comments)    Ragweed causes watery eyes, itching, sneezing  . Sulfa Antibiotics Other (See Comments)    Pt unsure of reaction, mother told him he was  allergic    Medications Prior to Admission  Medication Sig Dispense Refill  . acetaminophen (TYLENOL) 500 MG tablet Take 500-1,000 mg by mouth every 6 (six) hours as needed for mild pain or headache.    [name] aspirin EC 81 MG tablet Take 1 tablet (81 mg total) by mouth daily.    [name] atorvastatin (LIPITOR) 80 MG tablet TAKE 1 TABLET BY MOUTH DAILY AT 6 PM; MAKE ANNUAL APPOINTMENT WITH DOCTOR KLEIN FOR FUTURE REFILLS (Patient taking differently: Take 80 mg by mouth daily at 6 PM. ) 90 tablet 3  . BRILINTA 90 MG TABS tablet TAKE 1 TABLET BY MOUTH TWICE DAILY(NEEDS OFFICE VISIT FOR REFILLS) (Patient taking differently: Take 90 mg by mouth 2 (two) times daily. ) 180 tablet 2  . cephALEXin (KEFLEX) 500 MG capsule Take 1 capsule (500 mg total) by mouth 2 (two) times daily. 14 capsule 0  . Coenzyme Q10 (COQ10) 200 MG CAPS Take 200 mg by mouth daily.    . digoxin (LANOXIN) 0.125 MG tablet TAKE 1 TABLET BY MOUTH DAILY (Patient taking differently: Take 0.125 mg by mouth daily at 6 PM. ) 90 tablet 2  . MAGNESIUM-OXIDE 400 (241.3 Mg) MG tablet TAKE 1 TABLET BY MOUTH TWICE DAILY (Patient taking differently: Take 400 mg by mouth 2 (two) times daily. ) 60 tablet 3  . metoprolol succinate (TOPROL-XL) 50 MG 24 hr tablet TAKE 1 TABLET BY MOUTH DAILY (Patient taking differently: Take 50 mg by mouth daily. ) 90 tablet 1  . Multiple Vitamin (MULTIVITAMIN) tablet Take 1 tablet by mouth daily.    . nitroGLYCERIN (NITROSTAT) 0.4 MG SL tablet Place 1 tablet (0.4 mg total) under the tongue every 5 (five) minutes as needed for chest pain. 25 tablet 2  . Omega-3 Fatty Acids (FISH OIL) 1200 MG CPDR Take 1,200 mg by mouth daily.     . sacubitril-valsartan (ENTRESTO) 49-51 MG Take 1 tablet by mouth 2 (two) times daily. 180 tablet 3  . spironolactone (ALDACTONE) 25 MG tablet Take 1 tablet (25 mg total) by mouth daily. (Patient taking differently: Take 12.5 mg by mouth daily. ) 90 tablet 2  . torsemide (DEMADEX) 20 MG tablet Take 2  tablets (40 mg total) by mouth 2 (two) times daily. every other day (Patient taking differently: Take 80 mg by mouth every other day. ) 360 tablet 1  . venlafaxine (EFFEXOR) 75 MG tablet TAKE 1 TABLET BY MOUTH EVERY DAY (Patient taking differently: Take 75 mg by mouth daily. ) 30 tablet 2  . Cholecalciferol (VITAMIN D) 2000 units CAPS Take 1 Units by mouth daily.     . Investigational - Study Medication Take 1 tablet by mouth 2 (two) times daily. Study name: Galactic HF Study Additional study details: Omecamtiv Mecarbil or Placebo (Patient not taking: Reported on 12/17/2018) 14 each PRN    Drug Regimen Review Drug regimen was reviewed and remains appropriate with no significant  issues identified  Home: Home Living Family/patient expects to be discharged to:: Private residence Living Arrangements: Spouse/significant other Available Help at Discharge: Family, Available PRN/intermittently Type of Home: House Home Access: Stairs to enter [employer] of Steps: 3 Entrance Stairs-Rails: Right, Left, Can reach both Home Layout: One level Bathroom Shower/Tub: Chiropodist: Standard Bathroom Accessibility: Yes Home Equipment: Cane - single point   Functional History: Prior Function Level of Independence: Needs assistance Gait / Transfers Assistance Needed: walking with a cane ADL's / Homemaking Assistance Needed: reports he could perform all ADLs and stood in shower Comments: 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  Functional Status:  Mobility: Bed Mobility Overal bed mobility: Needs Assistance Bed Mobility: Supine to Sit Supine to sit: Mod assist, HOB elevated Sit to supine: Mod assist, +2 for physical assistance General bed mobility comments: increased time, pulled up on therapist's hand and needed assist with bed pad to pull L hip to EOB Transfers Overall transfer level: Needs assistance Equipment used: Rolling walker (2 wheeled) Transfers: Sit to/from Stand, Stand Pivot  Transfers Sit to Stand: +2 physical assistance, Max assist, From elevated surface Stand pivot transfers: Max assist, +2 physical assistance, +2 safety/equipment General transfer comment: pt pulling up on stablized walker, blocked feet from slipping assist to rise and steady, pt with strong posterior lean and stabilizing LEs on bed; assistance required for sequencing, managing RW, and balance to pivot to recliner  Ambulation/Gait Ambulation/Gait assistance: Max assist Gait Distance (Feet): 2 Feet Assistive device: Rolling walker (2 wheeled) Gait Pattern/deviations: Decreased stride length, Shuffle General Gait Details: 2'x2:  side steps to recliner, rested, then steps back to bed.  Pt very unsteady, posterior lean, requiring max A to maintain balance, fatiguing easily, and abruptly sitting in chair and bed Gait velocity: .51f/sec Gait velocity interpretation: <1.8 ft/sec, indicate of risk for recurrent falls    ADL: ADL Overall ADL's : Needs assistance/impaired Eating/Feeding: Set up, Sitting Grooming: Oral care, Minimal assistance, Sitting Grooming Details (indicate cue type and reason): cues for sequencing Upper Body Bathing: Moderate assistance, Sitting Lower Body Bathing: Total assistance, +2 for physical assistance, Sit to/from stand Upper Body Dressing : Moderate assistance, Sitting Lower Body Dressing: +2 for physical assistance, Total assistance, Sit to/from stand Lower Body Dressing Details (indicate cue type and reason): pt does not wear socks typically, uses slip on shoes Toilet Transfer: +2 for physical assistance, Maximal assistance, Stand-pivot, RW, BSC Toileting- Clothing Manipulation and Hygiene: +2 for physical assistance, Total assistance, Sit to/from stand  Cognition: Cognition Overall Cognitive Status: Impaired/Different from baseline Orientation Level: Oriented X4 Cognition Arousal/Alertness: Awake/alert Behavior During Therapy: Flat affect Overall Cognitive  Status: Impaired/Different from baseline Area of Impairment: Orientation, Memory, Following commands, Safety/judgement, Problem solving, Awareness, Attention Orientation Level: Disoriented to, Time, Situation Current Attention Level: Sustained Memory: Decreased short-term memory Following Commands: Follows one step commands with increased time, Follows one step commands inconsistently Safety/Judgement: Decreased awareness of safety, Decreased awareness of deficits Awareness: Intellectual Problem Solving: Slow processing, Decreased initiation, Difficulty sequencing, Requires verbal cues, Requires tactile cues General Comments: pt repeating himself throughout session  Physical Exam: Blood pressure 98/74, pulse 97, temperature 98.2 F (36.8 C), temperature source Oral, resp. rate (!) 23, height 6' 1.5" (1.867 m), weight 122.8 kg, SpO2 94 %. Physical Exam  Vitals reviewed. Constitutional: He appears well-developed and well-nourished.  HENT:  Head: Normocephalic and atraumatic.  Eyes: EOM are normal. Right eye exhibits no discharge. Left eye exhibits no discharge.  Neck: No tracheal deviation present. No thyromegaly present.  Respiratory: Effort normal. No respiratory distress.  GI: He exhibits no distension.  Musculoskeletal:     Comments: Some edema in b/l feet  Neurological: He is alert.  He does make good eye contact with examiner.   He was a bit slow to process but was able to provide his name age in place.  He was however limited medical historian. Motor: B/l UE 4-4+/5 proximal to distal B/l LE: HF, KE 4/5, ADF limited due to unna boots  Skin:  Bilateral Unna boots lower extremities Ulcer on dorsal left second digit  Psychiatric: He has a normal mood and affect. His behavior is normal.    Results for orders placed or performed during the hospital encounter of 12/17/18 (from the past 48 hour(s))  Basic metabolic panel     Status: Abnormal   Collection Time: 12/22/18  5:12 AM   Result Value Ref Range   Sodium 131 (L) 135 - 145 mmol/L   Potassium 3.6 3.5 - 5.1 mmol/L   Chloride 91 (L) 98 - 111 mmol/L   CO2 25 22 - 32 mmol/L   Glucose, Bld 98 70 - 99 mg/dL   BUN 47 (H) 8 - 23 mg/dL   Creatinine, Ser 2.12 (H) 0.61 - 1.24 mg/dL   Calcium 8.4 (L) 8.9 - 10.3 mg/dL   GFR calc non Af Amer 30 (L) >60 mL/min   GFR calc Af Amer 34 (L) >60 mL/min   Anion gap 15 5 - 15    Comment: Performed at [facility], [address] [place], [place] [address]  CBC with Differential/Platelet     Status: Abnormal   Collection Time: 12/22/18  5:12 AM  Result Value Ref Range   WBC 11.7 (H) 4.0 - 10.5 K/uL   RBC 5.16 4.22 - 5.81 MIL/uL   Hemoglobin 14.7 13.0 - 17.0 g/dL   HCT 45.1 39.0 - 52.0 %   MCV 87.4 80.0 - 100.0 fL   MCH 28.5 26.0 - 34.0 pg   MCHC 32.6 30.0 - 36.0 g/dL   RDW 15.7 (H) 11.5 - 15.5 %   Platelets 285 150 - 400 K/uL   nRBC 0.0 0.0 - 0.2 %   Neutrophils Relative % 77 %   Neutro Abs 9.0 (H) 1.7 - 7.7 K/uL   Lymphocytes Relative 12 %   Lymphs Abs 1.4 0.7 - 4.0 K/uL   Monocytes Relative 8 %   Monocytes Absolute 0.9 0.1 - 1.0 K/uL   Eosinophils Relative 3 %   Eosinophils Absolute 0.3 0.0 - 0.5 K/uL   Basophils Relative 0 %   Basophils Absolute 0.0 0.0 - 0.1 K/uL   Immature Granulocytes 0 %   Abs Immature Granulocytes 0.04 0.00 - 0.07 K/uL    Comment: Performed at [facility], [address], [place], [place] [address]  .Cooxemetry Panel (carboxy, met, total hgb, O2 sat)     Status: Abnormal   Collection Time: 12/22/18  5:12 AM  Result Value Ref Range   Total hemoglobin 15.4 12.0 - 16.0 g/dL   O2 Saturation 61.4 %   Carboxyhemoglobin 1.6 (H) 0.5 - 1.5 %   Methemoglobin 0.8 0.0 - 1.5 %    Comment: Performed at [facility] [address], [place], [place] [address]  CBC with Differential/Platelet     Status: Abnormal   Collection Time: 12/23/18  4:30 AM  Result Value Ref Range   WBC 11.1 (H) 4.0 - 10.5 K/uL   RBC 5.33 4.22 -  5.81  MIL/uL   Hemoglobin 15.1 13.0 - 17.0 g/dL   HCT 47.2 39.0 - 52.0 %   MCV 88.6 80.0 - 100.0 fL   MCH 28.3 26.0 - 34.0 pg   MCHC 32.0 30.0 - 36.0 g/dL   RDW 15.8 (H) 11.5 - 15.5 %   Platelets 298 150 - 400 K/uL   nRBC 0.0 0.0 - 0.2 %   Neutrophils Relative % 72 %   Neutro Abs 8.2 (H) 1.7 - 7.7 K/uL   Lymphocytes Relative 15 %   Lymphs Abs 1.6 0.7 - 4.0 K/uL   Monocytes Relative 8 %   Monocytes Absolute 0.9 0.1 - 1.0 K/uL   Eosinophils Relative 3 %   Eosinophils Absolute 0.3 0.0 - 0.5 K/uL   Basophils Relative 1 %   Basophils Absolute 0.1 0.0 - 0.1 K/uL   Immature Granulocytes 1 %   Abs Immature Granulocytes 0.05 0.00 - 0.07 K/uL    Comment: Performed at [facility] [address] [place], [place] [address]  .Cooxemetry Panel (carboxy, met, total hgb, O2 sat)     Status: Abnormal   Collection Time: 12/23/18  4:30 AM  Result Value Ref Range   Total hemoglobin 14.7 12.0 - 16.0 g/dL   O2 Saturation 57.0 %   Carboxyhemoglobin 1.6 (H) 0.5 - 1.5 %   Methemoglobin 0.9 0.0 - 1.5 %    Comment: Performed at [facility] [address], [place], [place] [address]  Basic metabolic panel     Status: Abnormal   Collection Time: 12/23/18  4:30 AM  Result Value Ref Range   Sodium 129 (L) 135 - 145 mmol/L   Potassium 3.8 3.5 - 5.1 mmol/L   Chloride 89 (L) 98 - 111 mmol/L   CO2 25 22 - 32 mmol/L   Glucose, Bld 89 70 - 99 mg/dL   BUN 42 (H) 8 - 23 mg/dL   Creatinine, Ser 1.90 (H) 0.61 - 1.24 mg/dL   Calcium 9.1 8.9 - 10.3 mg/dL   GFR calc non Af Amer 34 (L) >60 mL/min   GFR calc Af Amer 39 (L) >60 mL/min   Anion gap 15 5 - 15    Comment: Performed at [facility] [address], [place], [place] [address]   No results found.   Medical Problem List and Plan: 1.  Debility secondary to acute on chronic systolic congestive heart failure with history of CAD, ischemic cardiomyopathy ejection fraction 15 to 20%  -patient may may not shower  -ELOS/Goals: 14-17 days.  Supervision/Min A  Admit to CIR 2.  Antithrombotics: -DVT/anticoagulation: Subcutaneous heparin  -antiplatelet therapy: Aspirin 81 mg daily, Brilinta 90 mg twice daily 3. Pain Management: Tylenol as needed 4. Mood: Prozac 10 mg daily  -antipsychotic agents: N/A 5. Neuropsych: This patient is capable of making decisions on his own behalf. 6. Skin/Wound Care: Routine skin checks/ UNNA boots as directed 7. Fluids/Electrolytes/Nutrition: Routine in and outs. CMP ordered.  8.  CKD stage [name].  Labs ordered. 9.  Hypotension.  Digoxin and Entresto on hold as well as Bidil with follow-up per cardiology services.  Monitor with increased mobility.  10.  Left bundle branch block.  Patient currently on amiodarone 200 mg twice daily.  Follow-up in EP lab Dr. Caryl Comes 11.  Hyperlipidemia.  Lipitor 12. Combined CHF  Daily weights  Cathlyn Parsons, PA-C 12/23/2018  I have personally performed a face to face diagnostic evaluation, including, but not limited to relevant  history and physical exam findings, of this patient and developed relevant assessment and plan.  Additionally, I have reviewed and concur with the physician assistant's documentation above.  Delice Lesch, MD, ABPMR

## 2018-12-23 NOTE — Progress Notes (Signed)
Late entry due to missed charges.   12/22/18 1100  OT Time Calculation  OT Start Time (ACUTE ONLY) 1044  OT Stop Time (ACUTE ONLY) 1120  OT Time Calculation (min) 36 min  OT General Charges  $OT Visit 1 Visit  OT Evaluation  $OT Eval Moderate Complexity 1 Mod

## 2018-12-23 NOTE — Plan of Care (Signed)

## 2018-12-23 NOTE — Progress Notes (Addendum)
Advanced Heart Failure Rounding Note  PCP-Cardiologist: Glenetta Hew, MD  AHF: Dr. Haroldine Laws   Subjective:    Yesterday milrinone stopped. Co-ox 57%.   Complaining of fatigue this morning. Denies CP or SOB. Falls asleep easily.   Creatinine 2.12 -> 1.90    Objective:   Weight Range: 122.8 kg Body mass index is 35.23 kg/m.   Vital Signs:   Temp:  [97.5 F (36.4 C)-98.4 F (36.9 C)] 98.2 F (36.8 C) (12/04 0750) Pulse Rate:  [79-107] 97 (12/04 0750) Resp:  [18-23] 23 (12/04 0750) BP: (85-103)/(64-74) 98/74 (12/04 0750) SpO2:  [88 %-97 %] 94 % (12/04 0750) Weight:  [122.8 kg] 122.8 kg (12/04 0435) Last BM Date: 12/22/18  Weight change: Filed Weights   12/21/18 0527 12/22/18 0116 12/23/18 0435  Weight: 124.4 kg 124.7 kg 122.8 kg    Intake/Output:   Intake/Output Summary (Last 24 hours) at 12/23/2018 0902 Last data filed at 12/23/2018 0849 Gross per 24 hour  Intake 720 ml  Output 850 ml  Net -130 ml      Physical Exam  CVP 5-6  General:  Appears chronically ill. No resp difficulty HEENT: normal anicteric Neck: supple. no JVD. Carotids 2+ bilat; no bruits. No lymphadenopathy or thryomegaly appreciated. Cor: PMI nondisplaced. Irregular rate & rhythm. No rubs, gallops or murmurs. Lungs: clear no wheeze Abdomen: obese soft, nontender, nondistended. No hepatosplenomegaly. No bruits or masses. Good bowel sounds. Extremities: no cyanosis, clubbing, rash, R and LLE unna boots. RUE PICC . No edema. Multiple excoriations and open sores Neuro: alert & oriented x 3, cranial nerves grossly intact. moves all 4 extremities w/o difficulty. Affect pleasant    Telemetry   SR PVCs LBBB.    EKG    No new EKG to review  Labs    CBC Recent Labs    12/22/18 0512 12/23/18 0430  WBC 11.7* 11.1*  NEUTROABS 9.0* 8.2*  HGB 14.7 15.1  HCT 45.1 47.2  MCV 87.4 88.6  PLT 285 128   Basic Metabolic Panel Recent Labs    12/22/18 0512 12/23/18 0430  NA 131* 129*   K 3.6 3.8  CL 91* 89*  CO2 25 25  GLUCOSE 98 89  BUN 47* 42*  CREATININE 2.12* 1.90*  CALCIUM 8.4* 9.1   Liver Function Tests No results for input(s): AST, ALT, ALKPHOS, BILITOT, PROT, ALBUMIN in the last 72 hours. No results for input(s): LIPASE, AMYLASE in the last 72 hours. Cardiac Enzymes No results for input(s): CKTOTAL, CKMB, CKMBINDEX, TROPONINI in the last 72 hours.  BNP: BNP (last 3 results) Recent Labs    12/18/18 0456  BNP 2,328.4*    ProBNP (last 3 results) No results for input(s): PROBNP in the last 8760 hours.   D-Dimer No results for input(s): DDIMER in the last 72 hours. Hemoglobin A1C No results for input(s): HGBA1C in the last 72 hours. Fasting Lipid Panel No results for input(s): CHOL, HDL, LDLCALC, TRIG, CHOLHDL, LDLDIRECT in the last 72 hours. Thyroid Function Tests No results for input(s): TSH, T4TOTAL, T3FREE, THYROIDAB in the last 72 hours.  Invalid input(s): FREET3  Other results:   Imaging    No results found.   Medications:     Scheduled Medications: . aspirin EC  81 mg Oral Daily  . atorvastatin  80 mg Oral q1800  . Chlorhexidine Gluconate Cloth  6 each Topical Daily  . FLUoxetine  10 mg Oral Daily  . heparin  5,000 Units Subcutaneous Q8H  . sodium chloride  flush  10-40 mL Intracatheter Q12H  . sodium chloride flush  3 mL Intravenous Q12H  . ticagrelor  90 mg Oral BID    Infusions: . sodium chloride      PRN Medications: sodium chloride, acetaminophen, ondansetron (ZOFRAN) IV, sodium chloride flush, sodium chloride flush    Patient Profile   73 y/o male with multiple medical issues including ischemic CM with severe LV dysfunction with EF 15-20%. Has had severe HF for several years with low output on RHC in 2018. Admitted for a/c systolic HF w/ marked volume overload and bilateral LE cellulitis.   Assessment/Plan    1. 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  Admitted w/ NYHA IIIb  symptoms and massive volume overload. BNP 2328.  ECHO this admit EF 10-15% RV moderately entarged. RA/LA severely dilated.  Has St Jude ICD . Has LBBB will ask EP consider upgrade to CRT-D  Milrinone started due to low CO-OX and to assist with diuresis.  CO-OX stable off milrinone. CVP remains low.  - Off  digoxin and Entresto due to elevated creatinine.  - BP too soft for Bidil  - No  blocker due to low output - No SGLt2 with chronic rash under his pannus.   2. AKI: baseline SCr ~1.3. SCr 1.8 on admit in the setting of a/c CHF w/ volume overload. 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  Suspect elevated with overdiuresis.  1.86>>1.77>>1.73>>2.0>> 2.1 >1.9  - Continue to hold digoxin, diuretics and Entresto today.   3. Bilateral LE Cellulitis:  - on Keflex 500 bid per IM  - WBC trending down. Remains afebrile   4. Hyponatremia: - Sodium 129  -Watch closely.   5. CAD: hx of STEMI 2017 s/p DES LAD, non-obs dz at cath 2018 -No chest pain.  - Continue medical therapy: ASA, Brilinta and statin. No  blocker due to low output.   6. LBBB  Back in 2019 Dr Caryl Comes had planned to upgrade to CRT-D. We will need to follow up with EP.  QRS 192 ms   7. Deconditioning CIR consulted.   8. PVCs ? EP evaluating. ?possible addition of amiodarone.   Length of Stay: [place], NP  12/23/2018, 9:02 AM  Advanced Heart Failure Team Pager [phone] (M-F; 7a - 4p)  Please contact [facility] for night-coverage after hours (4p -7a ) and weekends on [email or web address]  Patient seen and examined with the above-signed Advanced Practice Provider and/or Housestaff. I personally reviewed laboratory data, imaging studies and relevant notes. I independently examined the patient and formulated the important aspects of the plan. I have edited the note to reflect any of my changes or salient points. I have personally discussed the plan with the patient and/or family.  Volume status looks good. CVP 5-6  (checked personally) Co-ox 57% of milrinone x 24 hr. Lasix on hold. Creatinine improving 2.1 -> 1.9   ECG and tele reviewed and d/w EP. Suspect MAT. Will proceed with rate control for now and can consider amiodarone as needed. Not candidate for CRT upgrade with LE wounds at this time.   Would watch one more day off milrinone. Continue to hold digoxin, entresto and diuretics today. Possible restart tomorrow.   He can got to CIR today from my standpoint and we will follow.   Will need outpatient sleeps study.   Glori Bickers, MD  11:45 AM

## 2018-12-23 NOTE — PMR Pre-admission (Signed)
PMR Admission Coordinator Pre-Admission Assessment  Patient: Leonard [name] is an 73 y.o., male MRN: [id number] DOB: November 20, 1945 Height: 6' 1.5" (186.7 cm) Weight: 122.8 kg  Insurance Information HMO:     PPO:      PCP:      IPA:      80/20:      OTHER:  PRIMARY: Medicare A and B      Policy#: [id number]      Subscriber: patient CM Name:       Phone#:      Fax#:  Pre-Cert#: verified Civil engineer, contracting:  Benefits:  Phone #:      Name:  Eff. Date: 01/20/08     Deduct: $1408      Out of Pocket Max: n/a      Life Max: n/a CIR: 100%      SNF: 20 full days Outpatient: 80%     Co-Pay: 20% Home Health: 100%      Co-Pay:  DME: 80%     Co-Pay: 20% Providers: pt choice  SECONDARY: BCBS       Policy#: [id number]       Subscriber:  CM Name:       Phone#:      Fax#:  Pre-Cert#:       Employer:  Benefits:  Phone #:      Name:  Eff. Date:      Deduct:       Out of Pocket Max:       Life Max:  CIR:       SNF:  Outpatient:      Co-Pay:  Home Health:       Co-Pay:  DME:      Co-Pay:   Medicaid Application Date:       Case Manager:  Disability Application Date:       Case Worker:   The "Data Collection Information Summary" for patients in Inpatient Rehabilitation Facilities with attached "Privacy Act [place] Records" was provided and verbally reviewed with: Patient  Emergency Contact Information Contact Information    Name Relation Home Work Mobile   [place] Spouse [phone]        Current Medical History  Patient Admitting Diagnosis: cardiac debility   History of Present Illness: Leonard [name] is a 73 year old right-handed male with history of CAD/myocardial infarction//PTCA/Saint Jude ICD, left bundle branch block, ischemic cardiomyopathy ejection fraction of 15 to 20%, CKD stage [name], lower extremity venous stasis changes , as well as alcohol use.  Presented 12/17/2018 with generalized weakness as well as hypotensive.  Chemistries upon admission to the  ED showed a sodium 133, potassium 5.3, BUN 41, creatinine 1.18, SARS coronavirus negative, WBC 14,800, urinalysis negative nitrite, CK 119.  Cranial CT scan negative for acute changes.  Chest x-ray hazy interstitial opacities and central cuffing consistent with interstitial edema.  No pleural effusion.  Cardiology services consulted for acute on chronic systolic congestive heart failure.  Initially maintained on milrinone for blood pressure.  A follow-up echocardiogram completed with ejection fraction of 15 to 20%.  His digoxin and Entresto were held due to elevated creatinine with latest creatinine 1.90.  Patient currently remains on aspirin as well as Brilinta.  Noted stasis changes lower extremities versus cellulitis and placed on Keflex for coverage.  Unna boots were added for lower extremity ischemic changes.  Subcutaneous heparin for DVT prophylaxis.  Tolerating a regular diet with fluid restrictions.  Therapy evaluations completed and patient was recommended  for a comprehensive rehab program.    Patient's medical record from [facility] has been reviewed by the rehabilitation admission coordinator and physician.  Past Medical History  Past Medical History:  Diagnosis Date  . AICD (automatic cardioverter/defibrillator) present 08/26/2015   STJ  . Anxiety   . Atherosclerotic heart disease of native coronary artery with angina pectoris ([place]) 07/2015   a. 07/2015 Posterior STEMI/PCI: LM nl, LAd 40ost, RI 40, RCA 110m(3.0x22 Resolute Integrity DES distal, 3.0x12 Resolute Integrity DES prox), 90d (PTCA), RPDA small, nl, RPLB2 100 - too small for PTCA. -- patent in f/u cath 08/2015 & 05/2016 - patent RCA stents (~10%), RI 40%, LM ~30% ost LAD ~40%.  . Chronic combined systolic and diastolic CHF (congestive heart failure) ([place] 07/2015   a. 07/2015 Ehco: EF 45-50%, basal-mid inf and infsept AK, inflat, apical inf, and apical septal HK, Gr1 DD, triv MR.;; b - Echo 05/2016 -> EF 20-25%, Gr 2 DD.   Severe diffuse HK disproportionately severe HK and thinning of apex, anterior septum and anterior wall-with septal dyssynchrony.  PAP ~53 mmHg.  .Marland KitchenHyperlipidemia with target low density lipoprotein (LDL) cholesterol less than 70 mg/dL   . Hypertensive heart disease   . Ischemic cardiomyopathy 07/2015 - 05/2016   a. 07/2015 Echo: EF 45-50%.; Follow-up Echo 08/23/2015: EF 50-55% with mild inferior hypokinesis --> 05/2016: EF 20-25%, diffuse HK. PAP ~53 mmHg.  . Kidney disease, chronic, stage [name] (GFR 30-59 ml/min) 08/12/2015  . [place]bid [place]   . ST elevation myocardial infarction (STEMI) of inferior wall ([place] 08/12/2015   Subacute presentation for inferior MI with post infarction angina on the following day. EKG still shows injury current. Therefore decided to proceed with intervention of 100% mRCA thrombotic lesion.  . Sustained ventricular fibrillation ([place] 08/22/2015   Underwent EP evaluation with final ICD implantation.    Family History   family history includes Diabetes in his mother; Heart disease in his paternal grandfather and paternal grandmother.  Prior Rehab/Hospitalizations Has the patient had prior rehab or hospitalizations prior to admission? No  Has the patient had major surgery during 100 days prior to admission? No   Current Medications  Current Facility-Administered Medications:  .  0.9 %  sodium chloride infusion, 250 mL, Intravenous, PRN, Opyd, TIlene Qua MD .  acetaminophen (TYLENOL) tablet 650 mg, 650 mg, Oral, Q4H PRN, Opyd, TIlene Qua MD, 650 mg at 12/20/18 0659 .  amiodarone (PACERONE) tablet 200 mg, 200 mg, Oral, BID, Clegg, Amy D, NP, 200 mg at 12/23/18 1249 .  aspirin EC tablet 81 mg, 81 mg, Oral, Daily, Opyd, TIlene Qua MD, 81 mg at 12/23/18 1032 .  atorvastatin (LIPITOR) tablet 80 mg, 80 mg, Oral, q1800, Opyd, TIlene Qua MD, 80 mg at 12/22/18 1740 .  Chlorhexidine Gluconate Cloth 2 % PADS 6 each, 6 each, Topical, Daily, EAlma Friendly MD, 6 each  at 12/22/18 1857 .  FLUoxetine (PROZAC) capsule 10 mg, 10 mg, Oral, Daily, Samtani, Jai-Gurmukh, MD, 10 mg at 12/23/18 1034 .  heparin injection 5,000 Units, 5,000 Units, Subcutaneous, Q8H, EAlma Friendly MD, 5,000 Units at 12/23/18 1032 .  ondansetron (ZOFRAN) injection 4 mg, 4 mg, Intravenous, Q6H PRN, Opyd, Timothy S, MD .  sodium chloride flush (NS) 0.9 % injection 10-40 mL, 10-40 mL, Intracatheter, Q12H, EAlma Friendly MD, 10 mL at 12/23/18 1033 .  sodium chloride flush (NS) 0.9 % injection 10-40 mL, 10-40 mL, Intracatheter, PRN, EAlma Friendly MD .  sodium  chloride flush (NS) 0.9 % injection 3 mL, 3 mL, Intravenous, Q12H, Opyd, Ilene Qua, MD, 3 mL at 12/23/18 1033 .  sodium chloride flush (NS) 0.9 % injection 3 mL, 3 mL, Intravenous, PRN, Opyd, Ilene Qua, MD .  ticagrelor (BRILINTA) tablet 90 mg, 90 mg, Oral, BID, Opyd, Ilene Qua, MD, 90 mg at 12/23/18 1032  Patients Current Diet:  Diet Order            Diet - low sodium heart healthy        Diet Heart Room service appropriate? Yes; Fluid consistency: Thin; Fluid restriction: 1500 mL Fluid  Diet effective now              Precautions / Restrictions Precautions Precautions: Fall Precaution Comments: watch BP and HR Restrictions Weight Bearing Restrictions: No   Has the patient had 2 or more falls or a fall with injury in the past year? Yes  Prior Activity Level Limited Community (1-2x/wk): per wife, had been driving her to/from work daily up until a few months ago when he slowly started to decline, amb without AD at home but was more sedentary   Prior Functional Level Self Care: Did the patient need help bathing, dressing, using the toilet or eating? Independent  Indoor Mobility: Did the patient need assistance with walking from room to room (with or without device)? Independent  Stairs: Did the patient need assistance with internal or external stairs (with or without device)? Independent  Functional  Cognition: Did the patient need help planning regular tasks such as shopping or remembering to take medications? [place] / [place] Devices/Equipment: Cane (specify quad or straight)(straight) Home Equipment: Cane - single point  Prior Device Use: Indicate devices/aids used by the patient prior to current illness, exacerbation or injury? cane occasionally  Current Functional Level Cognition  Overall Cognitive Status: Impaired/Different from baseline Current Attention Level: Sustained Orientation Level: Oriented X4 Following Commands: Follows one step commands with increased time, Follows one step commands inconsistently Safety/Judgement: Decreased awareness of safety, Decreased awareness of deficits General Comments: pt repeating himself throughout session    Extremity Assessment (includes Sensation/Coordination)  Upper Extremity Assessment: Generalized weakness(+ B tremor)  Lower Extremity Assessment: Defer to PT evaluation RLE Deficits / Details: ROM WFL; MMT 4+/5; pitting edema, several small sores, and weeping LLE Deficits / Details: ROM WFL; MMT 4+/5; pitting edema, several small sores, and weeping    ADLs  Overall ADL's : Needs assistance/impaired Eating/Feeding: Set up, Sitting Grooming: Oral care, Minimal assistance, Sitting Grooming Details (indicate cue type and reason): cues for sequencing Upper Body Bathing: Moderate assistance, Sitting Lower Body Bathing: Total assistance, +2 for physical assistance, Sit to/from stand Upper Body Dressing : Moderate assistance, Sitting Lower Body Dressing: +2 for physical assistance, Total assistance, Sit to/from stand Lower Body Dressing Details (indicate cue type and reason): pt does not wear socks typically, uses slip on shoes Toilet Transfer: +2 for physical assistance, Maximal assistance, Stand-pivot, RW, BSC Toileting- Clothing Manipulation and Hygiene: +2 for physical assistance, Total  assistance, Sit to/from stand    Mobility  Overal bed mobility: Needs Assistance Bed Mobility: Supine to Sit Supine to sit: Mod assist, HOB elevated Sit to supine: Mod assist, +2 for physical assistance General bed mobility comments: increased time, pulled up on therapist's hand and needed assist with bed pad to pull L hip to EOB    Transfers  Overall transfer level: Needs assistance Equipment used: Rolling walker (2 wheeled) Transfers: Sit to/from  Stand, Stand Pivot Transfers Sit to Stand: +2 physical assistance, Max assist, From elevated surface Stand pivot transfers: Max assist, +2 physical assistance, +2 safety/equipment General transfer comment: pt pulling up on stablized walker, blocked feet from slipping assist to rise and steady, pt with strong posterior lean and stabilizing LEs on bed; assistance required for sequencing, managing RW, and balance to pivot to recliner     Ambulation / Gait / Stairs / Wheelchair Mobility  Ambulation/Gait Ambulation/Gait assistance: Max Web designer (Feet): 2 Feet Assistive device: Rolling walker (2 wheeled) Gait Pattern/deviations: Decreased stride length, Shuffle General Gait Details: 2'x2:  side steps to recliner, rested, then steps back to bed.  Pt very unsteady, posterior lean, requiring max A to maintain balance, fatiguing easily, and abruptly sitting in chair and bed Gait velocity: .65f/sec Gait velocity interpretation: <1.8 ft/sec, indicate of risk for recurrent falls    Posture / Balance Dynamic Sitting Balance Sitting balance - Comments: posterior lean with one LOB Balance Overall balance assessment: Needs assistance Sitting-balance support: Bilateral upper extremity supported, Feet supported Sitting balance-Leahy Scale: Poor Sitting balance - Comments: posterior lean with one LOB Standing balance support: Bilateral upper extremity supported, During functional activity Standing balance-Leahy Scale: Zero    Special  needs/care consideration BiPAP/CPAP no CPM no Continuous Drip IV no Dialysis no        Days n/a Life Vest no Oxygen no Special Bed no Trach Size no Wound Vac (area) no      Location n/a Skin   Stage II pressure, undocumented location     Diabetic mgmt: no Behavioral consideration no Chemo/radiation no   Previous Home Environment (from acute therapy documentation) Living Arrangements: Spouse/significant other Available Help at Discharge: Family, Available PRN/intermittently Type of Home: House Home Layout: One level Home Access: Stairs to enter Entrance Stairs-Rails: Right, Left, Can reach both Entrance Stairs-Number of Steps: 3 Bathroom Shower/Tub: TOptometrist Yes Home Care Services: No  Discharge Living Setting Plans for Discharge Living Setting: Patient's home Type of Home at Discharge: House Discharge Home Layout: One level Discharge Home Access: Stairs to enter Entrance Stairs-Rails: Can reach both Entrance Stairs-Number of Steps: 3 Discharge Bathroom Shower/Tub: Tub/shower unit Discharge Bathroom Toilet: Standard Discharge Bathroom Accessibility: Yes How Accessible: Accessible via walker Does the patient have any problems obtaining your medications?: No  Social/Family/Support Systems Patient Roles: Spouse Anticipated Caregiver: KClaiborne Billings(spouse) and hired caregivers Anticipated CAmbulance personInformation: [phone]/Limitations of Caregiver: works during the day, plans for 24/7 at discharge or SNF if needed Caregiver Availability: Evenings only Discharge Plan Discussed with Primary Caregiver: Yes Is Caregiver In Agreement with Plan?: Yes Does Caregiver/Family have Issues with Lodging/Transportation while Pt is in Rehab?: No  Goals/Additional Needs Patient/Family Goal for Rehab: PT/OT/SLP supervision Expected length of stay: 14-21 days Pt/Family Agrees to Admission and willing to participate:  Yes Program Orientation Provided & Reviewed with Pt/Caregiver Including Roles  & Responsibilities: Yes  Decrease burden of Care through IP rehab admission: n/a  Possible need for SNF placement upon discharge: Not anticipated.  Plan for home with 24/7 supervision from family and hired help.    Patient Condition: [facility] CM, and patient and spouse. I met with patient at the bedside and discussed via phone for inpatient rehabilitation assessment.  Patient will benefit from ongoing PT, OT and SLP, can actively participate in 3 hours of therapy a day 5 days of the week, and can make  measurable gains during the admission.  Patient will also benefit from the coordinated team approach during an Inpatient Acute Rehabilitation admission.  The patient will receive intensive therapy as well as Rehabilitation physician, nursing, social worker, and care management interventions.  Due to safety, skin/wound care, disease management, medication administration, pain management and patient education the patient requires 24 hour a day rehabilitation nursing.  The patient is currently max +2 with mobility and basic ADLs.  Discharge setting and therapy post discharge at home is anticipated.  Patient has agreed to participate in the Acute Inpatient Rehabilitation Program and will admit today.  Preadmission Screen Completed By:  Michel Santee, PT, DPT 12/23/2018 12:56 PM ______________________________________________________________________   Discussed status with Dr. Posey Pronto on 12/23/18  at 1:08 PM  and received approval for admission today.  Admission Coordinator:  Michel Santee, PT, DPT time 1:08 PM Sudie Grumbling 12/23/18    Assessment/Plan: Diagnosis: cardiac debility   1. Does the need for close, 24 hr/day Medical supervision in concert with the patient's rehab needs make it unreasonable for this patient to be served in a less intensive setting? Yes   2. Co-Morbidities requiring supervision/potential complications: CAD/myocardial infarction//PTCA/Saint Jude ICD, left bundle branch block, ischemic cardiomyopathy ejection fraction of 15 to 20%, CKD stage [name], lower extremity venous stasis changes, alcohol use 3. Due to bladder management, safety, skin/wound care, disease management and patient education, does the patient require 24 hr/day rehab nursing? Yes 4. Does the patient require coordinated care of a physician, rehab nurse, PT, OT, and SLP to address physical and functional deficits in the context of the above medical diagnosis(es)? Yes Addressing deficits in the following areas: balance, endurance, locomotion, strength, transferring, bathing, dressing, toileting and psychosocial support 5. Can the patient actively participate in an intensive therapy program of at least 3 hrs of therapy 5 days a week? Yes 6. The potential for patient to make measurable gains while on inpatient rehab is excellent 7. Anticipated functional outcomes upon discharge from inpatient rehab: supervision and min assist PT, supervision and min assist OT, n/a SLP 8. Estimated rehab length of stay to reach the above functional goals is: 17-20 days. 9. Anticipated discharge destination: Home 10. Overall Rehab/Functional Prognosis: excellent and good   MD Signature: Delice Lesch, MD, ABPMR

## 2018-12-24 ENCOUNTER — Inpatient Hospital Stay (HOSPITAL_COMMUNITY): Payer: Medicare Other | Admitting: Physical Therapy

## 2018-12-24 ENCOUNTER — Inpatient Hospital Stay (HOSPITAL_COMMUNITY): Payer: Medicare Other

## 2018-12-24 ENCOUNTER — Inpatient Hospital Stay (HOSPITAL_COMMUNITY): Payer: Medicare Other | Admitting: Speech Pathology

## 2018-12-24 DIAGNOSIS — R5381 Other malaise: Secondary | ICD-10-CM | POA: Diagnosis not present

## 2018-12-24 LAB — PROTIME-INR
INR: 1.1 (ref 0.8–1.2)
Prothrombin Time: 13.8 seconds (ref 11.4–15.2)

## 2018-12-24 LAB — APTT: aPTT: 34 seconds (ref 24–36)

## 2018-12-24 MED ORDER — ENOXAPARIN SODIUM 30 MG/0.3ML ~~LOC~~ SOLN
30.0000 mg | SUBCUTANEOUS | Status: DC
  Administered 2018-12-24: 30 mg via SUBCUTANEOUS
  Filled 2018-12-24: qty 0.3

## 2018-12-24 MED ORDER — SODIUM CHLORIDE 0.9% FLUSH
10.0000 mL | Freq: Two times a day (BID) | INTRAVENOUS | Status: DC
  Administered 2018-12-25: 30 mL

## 2018-12-24 MED ORDER — SODIUM CHLORIDE 0.9% FLUSH: 10.0000 mL | INTRAVENOUS | Status: DC | PRN

## 2018-12-24 MED ORDER — CHLORHEXIDINE GLUCONATE CLOTH 2 % EX PADS
6.0000 | MEDICATED_PAD | Freq: Every day | CUTANEOUS | Status: DC
  Administered 2018-12-24 – 2018-12-25 (×2): 6 via TOPICAL

## 2018-12-24 NOTE — Progress Notes (Signed)
Pt has personal wallet in the room. Pt was offered to lock personal belongings in safe area. Pt denied, and stated he wanted it placed in patient belonging bag with important documents, and placed in the bedside night stand.

## 2018-12-24 NOTE — Evaluation (Signed)
Physical Therapy Assessment and Plan  Patient Details  Name: Leonard [name] MRN: [id number] Date of Birth: Feb 02, 1945  PT Diagnosis: Difficulty walking, Muscle weakness and Pain in joint Rehab Potential: Good ELOS: 12 to 14 days   Today's Date: 12/24/2018 PT Individual Time: 1100-1205 PT Individual Time Calculation (min): 65 min    Problem List:  Patient Active Problem List   Diagnosis Date Noted  . Debility 12/23/2018  . Left bundle branch block   . Palliative care by specialist   . DNR (do not resuscitate) discussion   . Pressure injury of skin 12/18/2018  . Generalized weakness 12/17/2018  . Excessive drinking of alcohol 12/17/2018  . Lower extremity cellulitis 12/17/2018  . Depression 06/14/2018  . Need for shingles vaccine 06/14/2018  . AKI (acute kidney injury) ([place]) 06/11/2016  . S/P implantation of automatic cardioverter/defibrillator (AICD)   . Grief 02/27/2016  . Ischemic cardiomyopathy   . Morbid obesity ([place])   . Chronic combined systolic and diastolic CHF (congestive heart failure) ([place])   . Sustained ventricular fibrillation ([place]) 08/20/2015  . CKD (chronic kidney disease), stage III 08/14/2015  . Elevated glucose 08/14/2015  . ST elevation myocardial infarction (STEMI) of true posterior wall, subsequent episode of care ([place]) 08/12/2015  . Coronary artery disease involving native coronary artery with angina pectoris ([place]) 08/12/2015  . Essential hypertension 02/12/2015  . Hyperlipidemia LDL goal <70 02/12/2015  . Erectile dysfunction 02/12/2015  . BPH (benign prostatic hyperplasia) 02/12/2015    Past Medical History:  Past Medical History:  Diagnosis Date  . AICD (automatic cardioverter/defibrillator) present 08/26/2015   STJ  . Anxiety   . Atherosclerotic heart disease of native coronary artery with angina pectoris ([place]) 07/2015   a. 07/2015 Posterior STEMI/PCI: LM nl, LAd 40ost, RI 40, RCA 151m(3.0x22 Resolute Integrity DES distal, 3.0x12 Resolute  Integrity DES prox), 90d (PTCA), RPDA small, nl, RPLB2 100 - too small for PTCA. -- patent in f/u cath 08/2015 & 05/2016 - patent RCA stents (~10%), RI 40%, LM ~30% ost LAD ~40%.  . Chronic combined systolic and diastolic CHF (congestive heart failure) ([place] 07/2015   a. 07/2015 Ehco: EF 45-50%, basal-mid inf and infsept AK, inflat, apical inf, and apical septal HK, Gr1 DD, triv MR.;; b - Echo 05/2016 -> EF 20-25%, Gr 2 DD.  Severe diffuse HK disproportionately severe HK and thinning of apex, anterior septum and anterior wall-with septal dyssynchrony.  PAP ~53 mmHg.  .Marland KitchenHyperlipidemia with target low density lipoprotein (LDL) cholesterol less than 70 mg/dL   . Hypertensive heart disease   . Ischemic cardiomyopathy 07/2015 - 05/2016   a. 07/2015 Echo: EF 45-50%.; Follow-up Echo 08/23/2015: EF 50-55% with mild inferior hypokinesis --> 05/2016: EF 20-25%, diffuse HK. PAP ~53 mmHg.  . Kidney disease, chronic, stage III (GFR 30-59 ml/min) 08/12/2015  . Morbid obesity ([place]   . ST elevation myocardial infarction (STEMI) of inferior wall ([place] 08/12/2015   Subacute presentation for inferior MI with post infarction angina on the following day. EKG still shows injury current. Therefore decided to proceed with intervention of 100% mRCA thrombotic lesion.  . Sustained ventricular fibrillation ([place] 08/22/2015   Underwent EP evaluation with final ICD implantation.   Past Surgical History:  Past Surgical History:  Procedure Laterality Date  . CARDIAC CATHETERIZATION N/A 08/12/2015   Procedure: Left Heart Cath and Coronary Angiography;  Surgeon: DLeonie Man MD;  [facility];  Service: Cardiovascular: Inferior STEMI: 100% mid RCA, 90% distal RCA, 100% RPL.  40% ostial and proximal LAD and branch of ramus.  [name] CARDIAC CATHETERIZATION N/A 08/26/2015   Procedure: Left Heart Cath and Coronary Angiography;  Surgeon: Belva Crome, MD;  Location: [facility];  Service: Cardiovascular: To evaluate  sustained VT. Widely patent RCA stent and distal PTCA site. Also patent RPL branch. LAD lesion appeared to be more consistent with 50-55% and 40%. Otherwise stable from previous cath. EF estimated 35-45%.  [name] CARDIOVERSION N/A 08/23/2015   Procedure: CARDIOVERSION;  Surgeon: Deboraha Sprang, MD;  Location: [facility];  Service: Cardiovascular;  Laterality: N/A;  . CORONARY ANGIOPLASTY WITH STENT PLACEMENT  08/12/2015   Mid RCA 100% reduced to 0% with 2 overlapping Resolute DES.  3.0 x 22 mm with a 3.0 x 12 mm prox overlarp (postdilated to 3.4 mm); PTCA of dRCA 90%. (Very dificult,complex case - tortuous, [place] RCA - unable to advance longer stents.  RPL2 noted to have thromboembolic 267% occlusion - unable to reach.  . ELECTROPHYSIOLOGIC STUDY N/A 08/26/2015   Procedure: Electrophysiology Study;  Surgeon: Deboraha Sprang, MD;  Location: [facility];  Service: Cardiovascular;  Laterality: N/A;  . EP IMPLANTABLE DEVICE N/A 08/26/2015   Procedure: ICD Implant;  Surgeon: Deboraha Sprang, MD;  Location: [facility];  Service: Cardiovascular;  Laterality: LEFT:  St Jude ICD, serial number [id number].   [name] HEMORRHOID SURGERY    . RIGHT/LEFT HEART CATH AND CORONARY ANGIOGRAPHY N/A 06/10/2016   Procedure: Right/Left Heart Cath and Coronary Angiography;  Surgeon: Martinique, Peter M, MD;  Location: [facility] INVASIVE CV LAB:  Nonobstructive CAD. RCA stents PATENT.  Ost-Prox RCA, 10 %. Ost LM 30 %. Ost LAD 40 %. RI, 40 %; High PCWP  & LVEDP (23 mmHg) ; PAP 43/23/31 mmHg. CO/CI 3.89 / 1/5 (Severely Reduced)  . SKIN SURGERY    . TRANSTHORACIC ECHOCARDIOGRAM  08/13/2015; 08/23/2015   a. Mild concentric LVH. EF 45-50%. Basal-mid inferior and inferoseptal akinesis with hypokinesis of inferolateral and apical inferior wall. 1 DD. ;; b. Technically difficult study. Definity contrast administered.  Compared to a prior echo in 07/2015, the LVEF is slightly higher at 50-55% with mild inferior hypokinesis.  . TRANSTHORACIC  ECHOCARDIOGRAM  06/09/2016   EF  20-25%, GR 2 DD. Severe diffuse  hypokinesis with distinct regional wall motion abnormalities.   There is disproportionately severe hypokinesis and thinning of the apex, anterior septum and anterior wall (septal dyssynchrony). However, there is severe dyssynchrony , making it difficult to assess regional function.  PA P ~53 mmHg  . TRANSTHORACIC ECHOCARDIOGRAM  01/2017   Severely reduced EF.  GI to DD.  Moderate MR.  Mild to have a moderately elevated PAP (peak 37mHg)    Assessment & Plan Clinical Impression: Patient is a 73year old right-handed male with history of CAD/myocardial infarction//PTCA/Saint Jude ICD, left bundle branch block, ischemic cardiomyopathy ejection fraction of 15 to 20%, CKD stage III, lower extremity venous stasis changes , as well as alcohol use.  Per chart review and patient, patient lives with spouse. 1 level home 3 steps to entry.  Walks with a cane prior to admission.  He was able to perform his own ADLs.  Presented 12/17/2018 with generalized weakness and hypotensive. Chemistries upon admission to the ED showed a sodium 133, potassium 5.3, BUN 41, creatinine 1.18, SARS coronavirus negative, WBC 14,800, urinalysis negative nitrite, CK 119.  Cranial CT unremarkable for acute changes. Chest x-ray hazy interstitial opacities and central cuffing consistent with interstitial edema.  No pleural effusion.  Cardiology services consulted for acute on chronic systolic congestive heart failure.  Initially maintained on milrinone for blood pressure.  A follow-up echo completed, showing EF of 15-20%. His digoxin and Entresto were held due to elevated creatinine with latest creatinine 1.90.  Patient currently remains on aspirin as well as Brilinta.  Noted stasis changes lower extremities versus cellulitis and placed on Keflex for coverage.  Unna boots were added for lower extremity ischemic changes.  Subcutaneous heparin for DVT prophylaxis.  Tolerating a  regular diet with fluid restrictions.   Patient transferred to CIR on 12/23/2018 .   Patient currently requires mod with mobility secondary to muscle weakness, decreased cardiorespiratoy endurance and decreased problem solving.  Prior to hospitalization, patient was modified independent  with mobility and lived with Spouse in a Other(Comment)(Condo) home.  Home access is 1 step with B rails, short walkway, then 2 steps with B railsStairs to enter.  Patient will benefit from skilled PT intervention to maximize safe functional mobility, minimize fall risk and decrease caregiver burden for planned discharge home with intermittent assist.  Anticipate patient will benefit from follow up [facility] at discharge.  PT - End of Session Activity Tolerance: Tolerates 30+ min activity with multiple rests Endurance Deficit: Yes PT Assessment Rehab Potential (ACUTE/IP ONLY): Good PT Barriers to Discharge: Inaccessible home environment;Decreased caregiver support PT Patient demonstrates impairments in the following area(s): Endurance;Balance;Motor PT Transfers Functional Problem(s): Bed Mobility;Bed to Chair;Car PT Locomotion Functional Problem(s): Ambulation;Stairs;Wheelchair Mobility PT Plan PT Intensity: Minimum of 1-2 x/day ,45 to 90 minutes PT Frequency: 5 out of 7 days PT Duration Estimated Length of Stay: 12 to 14 days PT Treatment/Interventions: Ambulation/gait training;Balance/vestibular training;Discharge planning;Patient/family education;Therapeutic Exercise;Therapeutic Activities;Stair training;Neuromuscular re-education;UE/LE Coordination activities;UE/LE Strength taining/ROM;Wheelchair propulsion/positioning PT Transfers Anticipated Outcome(s): mod I transfers PT Locomotion Anticipated Outcome(s): mod I gait and stairs PT Recommendation Follow Up Recommendations: Home health PT Patient destination: Home Equipment Recommended: To be determined  Skilled Therapeutic Intervention PT evaluation  completed and treatment plan initiated. Pt performed multiple sit to stands in the parallel bars with c/g to min A with verbal cues. Pt's standing tolerance was about 15 to 30 seconds. Pt performed sit to stand in stedy with min A and verbal cues. Following treatment pt returned to room and left sitting up in w/c with all needs within reach. Answered all questions in regards to PT plan of care.   PT Evaluation Precautions/Restrictions Precautions Precautions: Fall Precaution Comments: watch BP and HR Restrictions Weight Bearing Restrictions: No General Chart Reviewed: Yes Family/Caregiver Present: No Vital Signs Pain Pain Assessment Pain Score: 0-No pain Home Living/Prior Functioning Home Living Available Help at Discharge: Family;Available PRN/intermittently Type of Home: Other(Comment)(Condo) Home Access: Stairs to enter Entrance Stairs-Number of Steps: 1 step with B rails, short walkway, then 2 steps with B rails Entrance Stairs-Rails: Right;Left;Can reach both Home Layout: One level Bathroom Shower/Tub: Chiropodist: Standard Bathroom Accessibility: Yes  Lives With: Spouse Prior Function Level of Independence: Independent with gait;Independent with transfers;Other (comment)(over last 6 months utilizing walking stick for increased stability)  Able to Take Stairs?: Yes Driving: Yes 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[employer] Comments: 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 Vision/Perception  Vision - Assessment Additional Comments: able to see clock on wall and read menu Perception Perception: Within Functional Limits Praxis Praxis: Intact  Cognition Overall Cognitive Status: Impaired/Different from baseline Arousal/Alertness: Awake/alert Orientation Level: Oriented X4 Memory: Appears intact Awareness: Appears intact Problem Solving: Impaired Safety/Judgment: Appears intact Sensation Sensation Light Touch: Impaired  by gross assessment(decreased B  feet) Coordination Gross Motor Movements are Fluid and Coordinated: Yes Motor  Motor Motor - Skilled Clinical Observations: generalized weakness B LEs  Mobility Bed Mobility Bed Mobility: Supine to Sit Supine to Sit: Contact Guard/Touching assist Transfers Transfers: Sit to Stand;Stand to Sit;Lateral/Scoot Transfers Sit to Stand: Moderate Assistance - Patient 50-74%;Minimal Assistance - Patient > 75% Stand to Sit: Moderate Assistance - Patient 50-74%;Minimal Assistance - Patient > 75% Lateral/Scoot Transfers: Moderate Assistance - Patient 50-74% Locomotion  Gait Ambulation: Yes Gait Assistance: Moderate Assistance - Patient 50-74% Gait Distance (Feet): 2 Feet Assistive device: Parallel bars Stairs / Additional Locomotion Stairs: Yes Stairs Assistance: Other (comment)(deferred secondary to fatigue) Wheelchair Mobility Wheelchair Mobility: Yes Wheelchair Assistance: Minimal assistance - Patient >75% Wheelchair Propulsion: Both upper extremities Distance: 50  Trunk/Postural Assessment  Cervical Assessment Cervical Assessment: Exceptions to [facility] Thoracic Assessment Thoracic Assessment: Exceptions to [facility] Lumbar Assessment Lumbar Assessment: Exceptions to [facility] Postural Control Postural Control: Deficits on evaluation  Balance Balance Balance Assessed: Yes Static Sitting Balance Static Sitting - Balance Support: Bilateral upper extremity supported;Feet supported Static Sitting - Level of Assistance: 5: Stand by assistance Static Standing Balance Static Standing - Balance Support: During functional activity Static Standing - Level of Assistance: 3: Mod assist;4: Min assist Dynamic Standing Balance Dynamic Standing - Level of Assistance: 3: Mod assist Extremity Assessment B UEs as per OT evaluation.   RLE Assessment RLE Assessment: Exceptions to [facility] Passive Range of Motion (PROM) Comments: grossly WFLs except decreased DF General Strength Comments: grossly 3-/5 LLE  Assessment Passive Range of Motion (PROM) Comments: grossly WFLs except decreased DF General Strength Comments: grossly 3-/5    Refer to Care Plan for Long Term Goals  Recommendations for other services: None   Discharge Criteria: Patient will be discharged from PT if patient refuses treatment 3 consecutive times without medical reason, if treatment goals not met, if there is a change in medical status, if patient makes no progress towards goals or if patient is discharged from hospital.  The above assessment, treatment plan, treatment alternatives and goals were discussed and mutually agreed upon: by patient  Dub Amis 12/24/2018, 12:53 PM

## 2018-12-24 NOTE — Progress Notes (Signed)
Advanced Heart Failure Rounding Note  PCP-Cardiologist: Glenetta Hew, MD  AHF: Dr. Haroldine Laws   Subjective:    Off milrinone. Now in CIR.  No co-ox drawn.   Weight down another 4 pounds on po torsemide.   Feels ok. Working with CIR. Denies CP, SO, orthopnea or PND. + fatigue  Amio started 12/4   Creatinine 2.12 -> 1.90 -> 1.8   Objective:   Weight Range: 120.9 kg Body mass index is 36.15 kg/m.   Vital Signs:   Temp:  [97.5 F (36.4 C)-97.9 F (36.6 C)] 97.5 F (36.4 C) (12/05 1340) Pulse Rate:  [91-104] 98 (12/05 1448) Resp:  [16-18] 18 (12/05 1340) BP: (89-106)/(64-81) 100/74 (12/05 1448) SpO2:  [97 %-99 %] 98 % (12/05 1340) Weight:  [120.9 kg-122.6 kg] 120.9 kg (12/05 0346) Last BM Date: 12/22/18  Weight change: Filed Weights   12/23/18 1743 12/24/18 0346  Weight: 122.6 kg 120.9 kg    Intake/Output:   Intake/Output Summary (Last 24 hours) at 12/24/2018 1508 Last data filed at 12/24/2018 1133 Gross per 24 hour  Intake 120 ml  Output -  Net 120 ml      Physical Exam   General:  Obese male sitting in bed No resp difficulty HEENT: normal Neck: supple. no JVD. Carotids 2+ bilat; no bruits. No lymphadenopathy or thryomegaly appreciated. Cor: PMI nondisplaced. Irregular rate & rhythm. No rubs, gallops or murmurs. Lungs: clear Abdomen: obese soft, nontender, nondistended. No hepatosplenomegaly. No bruits or masses. Good bowel sounds. Extremities: no cyanosis, clubbing, rash> + UNNA boots trace edema. Multiple excoriations  Neuro: alert & orientedx3, cranial nerves grossly intact. moves all 4 extremities w/o difficulty. Affect pleasant   Telemetry   Off tele  (in CIR)  Labs    CBC Recent Labs    12/22/18 0512 12/23/18 0430 12/23/18 1741  WBC 11.7* 11.1* 11.4*  NEUTROABS 9.0* 8.2*  --   HGB 14.7 15.1 16.0  HCT 45.1 47.2 49.1  MCV 87.4 88.6 87.1  PLT 285 298 540   Basic Metabolic Panel Recent Labs    12/22/18 0512 12/23/18 0430  12/23/18 1741  NA 131* 129*  --   K 3.6 3.8  --   CL 91* 89*  --   CO2 25 25  --   GLUCOSE 98 89  --   BUN 47* 42*  --   CREATININE 2.12* 1.90* 1.81*  CALCIUM 8.4* 9.1  --    Liver Function Tests No results for input(s): AST, ALT, ALKPHOS, BILITOT, PROT, ALBUMIN in the last 72 hours. No results for input(s): LIPASE, AMYLASE in the last 72 hours. Cardiac Enzymes No results for input(s): CKTOTAL, CKMB, CKMBINDEX, TROPONINI in the last 72 hours.  BNP: BNP (last 3 results) Recent Labs    12/18/18 0456  BNP 2,328.4*    ProBNP (last 3 results) No results for input(s): PROBNP in the last 8760 hours.   D-Dimer No results for input(s): DDIMER in the last 72 hours. Hemoglobin A1C No results for input(s): HGBA1C in the last 72 hours. Fasting Lipid Panel No results for input(s): CHOL, HDL, LDLCALC, TRIG, CHOLHDL, LDLDIRECT in the last 72 hours. Thyroid Function Tests No results for input(s): TSH, T4TOTAL, T3FREE, THYROIDAB in the last 72 hours.  Invalid input(s): FREET3  Other results:   Imaging    No results found.   Medications:     Scheduled Medications: . amiodarone  200 mg Oral BID  . aspirin EC  81 mg Oral Daily  . atorvastatin  80 mg Oral q1800  . Chlorhexidine Gluconate Cloth  6 each Topical Daily  . FLUoxetine  10 mg Oral Daily  . Living Better with Heart Failure Book   Does not apply Once  . ticagrelor  90 mg Oral BID    Infusions:   PRN Medications: acetaminophen, sorbitol    Patient Profile   73 y/o male with multiple medical issues including ischemic CM with severe LV dysfunction with EF 15-20%. Has had severe HF for several years with low output on RHC in 2018. Admitted for a/c systolic HF w/ marked volume overload and bilateral LE cellulitis.   Assessment/Plan    1. 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  Admitted w/ NYHA IIIb symptoms and massive volume overload. BNP 2328.  ECHO this admit EF 10-15% RV  moderately entarged. RA/LA severely dilated.  Has St Jude ICD .  - Volume status looks good on po torsemide. Continue  - Off  digoxin and Entresto due to elevated creatinine. BP too soft to restart entresto currently  - BP too soft for Bidil  - No  blocker due to low output - No SGLt2 with chronic rash under his pannus.  - Repeat co-ox in am  2. AKI: baseline SCr ~1.3. SCr 1.8 on admit in the setting of a/c CHF w/ volume overload. 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  - Creatinine peaked at 2.1 due to overdiuresis. Now back to 1.8. Will follow.   3. Bilateral LE Cellulitis:  - completed Keflex  - resolved  4. Hyponatremia: - Sodium 129 on 12/4. Receck in am  - Limit free water   5. CAD: hx of STEMI 2017 s/p DES LAD, non-obs dz at cath 2018 - No s/s ischemia - Continue medical therapy: ASA, Brilinta and statin. No  blocker due to low output.   6. LBBB  Back in 2019 Dr Caryl Comes had planned to upgrade to CRT-D. We will need to follow up with EP.  QRS 192 ms  - Currently not candidate for upgrade due to LE wounds and risk of infections  7. MAT - EP has seen. amio started 12/4 - Will get ECG in am    8. Deconditioning  - Appreciate CIR's help   9. DVT prophylaxis - start enox    Length of Stay: 1  Glori Bickers, MD  12/24/2018, 3:08 PM  Advanced Heart Failure Team Pager [phone] (M-F; 7a - 4p)  Please contact [facility] for night-coverage after hours (4p -7a ) and weekends on [email or web address]

## 2018-12-24 NOTE — Progress Notes (Signed)
[facility] PHYSICAL MEDICINE & REHABILITATION PROGRESS NOTE   Subjective/Complaints:  No issues  overnite per  Pt, pt had bleeding which required multiple dressing changes at Heparin injection site   ROS- neg CP, SOB, N/V/D  Objective:   No results found. Recent Labs    12/23/18 0430 12/23/18 1741  WBC 11.1* 11.4*  HGB 15.1 16.0  HCT 47.2 49.1  PLT 298 298   Recent Labs    12/22/18 0512 12/23/18 0430 12/23/18 1741  NA 131* 129*  --   K 3.6 3.8  --   CL 91* 89*  --   CO2 25 25  --   GLUCOSE 98 89  --   BUN 47* 42*  --   CREATININE 2.12* 1.90* 1.81*  CALCIUM 8.4* 9.1  --    No intake or output data in the 24 hours ending 12/24/18 0753   Physical Exam: Vital Signs Blood pressure 106/77, pulse (!) 102, temperature 97.7 F (36.5 C), temperature source Oral, resp. rate 18, height 6' (1.829 m), weight 120.9 kg, SpO2 98 %.  General: No acute distress Mood and affect are appropriate Heart: Regular rate and rhythm no rubs murmurs or extra sounds Lungs: Clear to auscultation, breathing unlabored, no rales or wheezes Abdomen: Positive bowel sounds, soft nontender to palpation, nondistended LLQ hematoma, dried blood on 2x2  Extremities: No clubbing, cyanosis, or edema Skin: No evidence of breakdown, no evidence of rash Neurologic: Cranial nerves II through XII intact, motor strength is 5/5 in bilateral deltoid, bicep, tricep, grip,4/6  hip flexor, knee extensors, ankle dorsiflexor and plantar flexor Sensory exam normal sensation to light touch and proprioception in bilateral upper and lower extremities Cerebellar exam normal finger to nose to finger as well as heel to shin in bilateral upper and lower extremities Musculoskeletal: Full range of motion in all 4 extremities. No joint swelling    Assessment/Plan: 1. Functional deficits secondary to debility  which require 3+ hours per day of interdisciplinary therapy in a comprehensive inpatient rehab setting.  Physiatrist  is providing close team supervision and 24 hour management of active medical problems listed below.  Physiatrist and rehab team continue to assess barriers to discharge/monitor patient progress toward functional and medical goals  Care Tool:  Bathing              Bathing assist       Upper Body Dressing/Undressing Upper body dressing        Upper body assist      Lower Body Dressing/Undressing Lower body dressing            Lower body assist       Toileting Toileting    Toileting assist       Transfers Chair/bed transfer  Transfers assist           Locomotion Ambulation   Ambulation assist              Walk 10 feet activity   Assist           Walk 50 feet activity   Assist           Walk 150 feet activity   Assist           Walk 10 feet on uneven surface  activity   Assist           Wheelchair     Assist               Wheelchair 50 feet with 2  turns activity    Assist            Wheelchair 150 feet activity     Assist          Blood pressure 106/77, pulse (!) 102, temperature 97.7 F (36.5 C), temperature source Oral, resp. rate 18, height 6' (1.829 m), weight 120.9 kg, SpO2 98 %.  Medical Problem List and Plan: 1.  Debility secondary to acute on chronic systolic congestive heart failure with history of CAD, ischemic cardiomyopathy ejection fraction 15 to 20%             -patient may may not shower             -ELOS/Goals: 14-17 days. Supervision/Min A    CIR evals  2.  Antithrombotics: -DVT/anticoagulation: Subcutaneous heparin- will hold today due to hematoma , check PT.PTT, may resume in am at BID dosing  Vs renal dosing for lovenox              -antiplatelet therapy: Aspirin 81 mg daily, Brilinta 90 mg twice daily 3. Pain Management: Tylenol as needed 4. Mood: Prozac 10 mg daily             -antipsychotic agents: N/A 5. Neuropsych: This patient is capable of making  decisions on his own behalf. 6. Skin/Wound Care: Routine skin checks/ UNNA boots as directed 7. Fluids/Electrolytes/Nutrition: Routine in and outs. CMP ordered.  8.  CKD stage III.  Labs ordered. 9.  Hypotension.  Digoxin and Entresto on hold as well as Bidil with follow-up per cardiology services.             Monitor with increased mobility.  10.  Left bundle branch block.  Patient currently on amiodarone 200 mg twice daily.  Follow-up in EP lab Dr. Caryl Comes 11.  Hyperlipidemia.  Lipitor 12. Combined CHF             Daily weights    LOS: 1 days A FACE TO FACE EVALUATION WAS PERFORMED  Charlett Blake 12/24/2018, 7:53 AM

## 2018-12-24 NOTE — Evaluation (Signed)
Speech Language Pathology Assessment and Plan  Patient Details  Name: Leonard [name] MRN: [id number] Date of Birth: 04-27-1945  Evaluation Only  Today's Date: 12/24/2018 SLP Individual Time: 1235-1300 SLP Individual Time Calculation (min): 25 min   Problem List:  Patient Active Problem List   Diagnosis Date Noted  . Debility 12/23/2018  . Left bundle branch block   . Palliative care by specialist   . DNR (do not resuscitate) discussion   . Pressure injury of skin 12/18/2018  . Generalized weakness 12/17/2018  . Excessive drinking of alcohol 12/17/2018  . Lower extremity cellulitis 12/17/2018  . Depression 06/14/2018  . Need for shingles vaccine 06/14/2018  . AKI (acute kidney injury) ([place]) 06/11/2016  . S/P implantation of automatic cardioverter/defibrillator (AICD)   . Grief 02/27/2016  . Ischemic cardiomyopathy   . Morbid obesity ([place])   . Chronic combined systolic and diastolic CHF (congestive heart failure) ([place])   . Sustained ventricular fibrillation ([place]) 08/20/2015  . CKD (chronic kidney disease), stage [name] 08/14/2015  . Elevated glucose 08/14/2015  . ST elevation myocardial infarction (STEMI) of true posterior wall, subsequent episode of care ([place]) 08/12/2015  . Coronary artery disease involving native coronary artery with angina pectoris ([place]) 08/12/2015  . Essential hypertension 02/12/2015  . Hyperlipidemia LDL goal <70 02/12/2015  . Erectile dysfunction 02/12/2015  . BPH (benign prostatic hyperplasia) 02/12/2015   Past Medical History:  Past Medical History:  Diagnosis Date  . AICD (automatic cardioverter/defibrillator) present 08/26/2015   STJ  . Anxiety   . Atherosclerotic heart disease of native coronary artery with angina pectoris ([place]) 07/2015   a. 07/2015 Posterior STEMI/PCI: LM nl, LAd 40ost, RI 40, RCA 142m(3.0x22 Resolute Integrity DES distal, 3.0x12 Resolute Integrity DES prox), 90d (PTCA), RPDA small, nl, RPLB2 100 - too small for PTCA. --  patent in f/u cath 08/2015 & 05/2016 - patent RCA stents (~10%), RI 40%, LM ~30% ost LAD ~40%.  . Chronic combined systolic and diastolic CHF (congestive heart failure) ([place] 07/2015   a. 07/2015 Ehco: EF 45-50%, basal-mid inf and infsept AK, inflat, apical inf, and apical septal HK, Gr1 DD, triv MR.;; b - Echo 05/2016 -> EF 20-25%, Gr 2 DD.  Severe diffuse HK disproportionately severe HK and thinning of apex, anterior septum and anterior wall-with septal dyssynchrony.  PAP ~53 mmHg.  .Marland KitchenHyperlipidemia with target low density lipoprotein (LDL) cholesterol less than 70 mg/dL   . Hypertensive heart disease   . Ischemic cardiomyopathy 07/2015 - 05/2016   a. 07/2015 Echo: EF 45-50%.; Follow-up Echo 08/23/2015: EF 50-55% with mild inferior hypokinesis --> 05/2016: EF 20-25%, diffuse HK. PAP ~53 mmHg.  . Kidney disease, chronic, stage [name] (GFR 30-59 ml/min) 08/12/2015  . Morbid obesity ([place]   . ST elevation myocardial infarction (STEMI) of inferior wall ([place] 08/12/2015   Subacute presentation for inferior MI with post infarction angina on the following day. EKG still shows injury current. Therefore decided to proceed with intervention of 100% mRCA thrombotic lesion.  . Sustained ventricular fibrillation ([place] 08/22/2015   Underwent EP evaluation with final ICD implantation.   Past Surgical History:  Past Surgical History:  Procedure Laterality Date  . CARDIAC CATHETERIZATION N/A 08/12/2015   Procedure: Left Heart Cath and Coronary Angiography;  Surgeon: DLeonie Man MD;  [facility];  Service: Cardiovascular: Inferior STEMI: 100% mid RCA, 90% distal RCA, 100% RPL. 40% ostial and proximal LAD and branch of ramus.  .Marland KitchenCARDIAC CATHETERIZATION N/A 08/26/2015   Procedure:  Left Heart Cath and Coronary Angiography;  Surgeon: Belva Crome, MD;  Location: [facility];  Service: Cardiovascular: To evaluate sustained VT. Widely patent RCA stent and distal PTCA site. Also patent RPL branch. LAD  lesion appeared to be more consistent with 50-55% and 40%. Otherwise stable from previous cath. EF estimated 35-45%.  [name] CARDIOVERSION N/A 08/23/2015   Procedure: CARDIOVERSION;  Surgeon: Deboraha Sprang, MD;  Location: [facility];  Service: Cardiovascular;  Laterality: N/A;  . CORONARY ANGIOPLASTY WITH STENT PLACEMENT  08/12/2015   Mid RCA 100% reduced to 0% with 2 overlapping Resolute DES.  3.0 x 22 mm with a 3.0 x 12 mm prox overlarp (postdilated to 3.4 mm); PTCA of dRCA 90%. (Very dificult,complex case - tortuous, [place] RCA - unable to advance longer stents.  RPL2 noted to have thromboembolic 322% occlusion - unable to reach.  . ELECTROPHYSIOLOGIC STUDY N/A 08/26/2015   Procedure: Electrophysiology Study;  Surgeon: Deboraha Sprang, MD;  Location: [facility];  Service: Cardiovascular;  Laterality: N/A;  . EP IMPLANTABLE DEVICE N/A 08/26/2015   Procedure: ICD Implant;  Surgeon: Deboraha Sprang, MD;  Location: [facility];  Service: Cardiovascular;  Laterality: LEFT:  St Jude ICD, serial number [id number].   [name] HEMORRHOID SURGERY    . RIGHT/LEFT HEART CATH AND CORONARY ANGIOGRAPHY N/A 06/10/2016   Procedure: Right/Left Heart Cath and Coronary Angiography;  Surgeon: Martinique, Peter M, MD;  Location[facility] IN[facility]ASIVE CV LAB:  Nonobstructive CAD. RCA stents PATENT.  Ost-Prox RCA, 10 %. Ost LM 30 %. Ost LAD 40 %. RI, 40 %; High PCWP  & LVEDP (23 mmHg) ; PAP 43/23/31 mmHg. CO/CI 3.89 / 1/5 (Severely Reduced)  . SKIN SURGERY    . TRANSTHORACIC ECHOCARDIOGRAM  08/13/2015; 08/23/2015   a. Mild concentric LVH. EF 45-50%. Basal-mid inferior and inferoseptal akinesis with hypokinesis of inferolateral and apical inferior wall. 1 DD. ;; b. Technically difficult study. Definity contrast administered.  Compared to a prior echo in 07/2015, the LVEF is slightly higher at 50-55% with mild inferior hypokinesis.  . TRANSTHORACIC ECHOCARDIOGRAM  06/09/2016   EF  20-25%, GR 2 DD. Severe diffuse  hypokinesis with distinct  regional wall motion abnormalities.   There is disproportionately severe hypokinesis and thinning of the apex, anterior septum and anterior wall (septal dyssynchrony). However, there is severe dyssynchrony , making it difficult to assess regional function.  PA P ~53 mmHg  . TRANSTHORACIC ECHOCARDIOGRAM  01/2017   Severely reduced EF.  GI to DD.  Moderate MR.  Mild to have a moderately elevated PAP (peak 57mHg)    Assessment / Plan / Recommendation Clinical Impression HDeangleo PassageIII is a 73year old right-handed male with history of CAD/myocardial infarction//PTCA/Saint Jude ICD, left bundle branch block, ischemic cardiomyopathy ejection fraction of 15 to 20%, CKD stage [name], lower extremity venous stasis changes, as well as alcohol use.  Per chart review and patient, patient lives with spouse. He was able to perform his own ADLs.  Presented 12/17/2018 with generalized weakness and hypotensive. Chemistries upon admission to the ED showed a sodium 133, potassium 5.3, BUN 41, creatinine 1.18, SARS coronavirus negative, WBC 14,800, urinalysis negative nitrite, CK 119.  Cranial CT unremarkable for acute changes. Chest x-ray hazy interstitial opacities and central cuffing consistent with interstitial edema.  No pleural effusion.  Cardiology services consulted for acute on chronic systolic congestive heart failure.  Initially maintained on milrinone for blood pressure.  A follow-up echo completed, showing EF of  15-20%. His digoxin and Entresto were held due to elevated creatinine with latest creatinine 1.90.  Patient currently remains on aspirin as well as Brilinta.  Noted stasis changes lower extremities versus cellulitis and placed on Keflex for coverage.  Unna boots were added for lower extremity ischemic changes.  Subcutaneous heparin for DVT prophylaxis.  Tolerating a regular diet with fluid restrictions.  Therapy evaluations completed and patient was admitted for a comprehensive rehab program.  Pt  presents with functional cognitive abilities during today's evaluation. Pt able to complete complex problem solving and demonstrates good awareness. Pt initially presents with some delayed processing however feel this isn't related to cognition but some depressive feelings per pt report. And as session progress and more familiar topics of interest were established, pt with much improved personality and timely responses. Education provided that given his low EF, he may experience some deficits with memory however none were witnessed during evaluation.    Skilled Therapeutic Interventions          Evaluation completed.   SLP Assessment  Patient does not need any further Speech Lanaguage Pathology Services    Recommendations  Recommendations for Other Services: Neuropsych consult Patient destination: Home Follow up Recommendations: None Equipment Recommended: None recommended by SLP    SLP Frequency   N/A  SLP Duration  SLP Intensity  SLP Treatment/Interventions  N/A   N/A    N/A   Pain Pain Assessment Pain Score: 0-No pain  Prior Functioning Cognitive/Linguistic Baseline: Within functional limits Type of Home: Other(Comment)(Condo)  Lives With: Spouse Available Help at Discharge: Family;Available PRN/intermittently Vocation: Retired  SLP Evaluation Cognition Overall Cognitive Status: Within Functional Limits for tasks assessed Arousal/Alertness: Awake/alert Orientation Level: Oriented X4 Memory: Appears intact Awareness: Appears intact Problem Solving: Impaired Safety/Judgment: Appears intact  Comprehension Auditory Comprehension Overall Auditory Comprehension: Appears within functional limits for tasks assessed Expression Expression Primary Mode of Expression: Verbal Verbal Expression Overall Verbal Expression: Appears within functional limits for tasks assessed Oral Motor Oral Motor/Sensory Function Overall Oral Motor/Sensory Function: Within functional  limits Motor Speech Overall Motor Speech: Appears within functional limits for tasks assessed   Short Term Goals: No short term goals set  Refer to Care Plan for Long Term Goals  Recommendations for other services: Neuropsych  Discharge Criteria: Patient will be discharged from SLP if patient refuses treatment 3 consecutive times without medical reason, if treatment goals not met, if there is a change in medical status, if patient makes no progress towards goals or if patient is discharged from hospital.  The above assessment, treatment plan, treatment alternatives and goals were discussed and mutually agreed upon: by patient  Siddhant Hashemi 12/24/2018, 3:02 PM

## 2018-12-24 NOTE — Evaluation (Signed)
Occupational Therapy Assessment and Plan  Patient Details  Name: Leonard [name] MRN: [id number] Date of Birth: [birth year]  OT Diagnosis: abnormal posture, cognitive deficits, muscle weakness (generalized) and swelling of limb Rehab Potential:   ELOS: 14-18 days     Today's Date: 12/24/2018 OT Individual Time: 0800-0900 OT Individual Time Calculation (min): 60 min     Problem List:  Patient Active Problem List   Diagnosis Date Noted  . Debility 12/23/2018  . Left bundle branch block   . Palliative care by specialist   . DNR (do not resuscitate) discussion   . Pressure injury of skin 12/18/2018  . Generalized weakness 12/17/2018  . Excessive drinking of alcohol 12/17/2018  . Lower extremity cellulitis 12/17/2018  . Depression 06/14/2018  . Need for shingles vaccine 06/14/2018  . AKI (acute kidney injury) ([place]) 06/11/2016  . S/P implantation of automatic cardioverter/defibrillator (AICD)   . Grief 02/27/2016  . Ischemic cardiomyopathy   . Morbid obesity ([place])   . Chronic combined systolic and diastolic CHF (congestive heart failure) ([place])   . Sustained ventricular fibrillation ([place]) 08/20/2015  . CKD (chronic kidney disease), stage [name] 08/14/2015  . Elevated glucose 08/14/2015  . ST elevation myocardial infarction (STEMI) of true posterior wall, subsequent episode of care ([place]) 08/12/2015  . Coronary artery disease involving native coronary artery with angina pectoris ([place]) 08/12/2015  . Essential hypertension 02/12/2015  . Hyperlipidemia LDL goal <70 02/12/2015  . Erectile dysfunction 02/12/2015  . BPH (benign prostatic hyperplasia) 02/12/2015    Past Medical History:  Past Medical History:  Diagnosis Date  . AICD (automatic cardioverter/defibrillator) present 08/26/2015   STJ  . Anxiety   . Atherosclerotic heart disease of native coronary artery with angina pectoris ([place]) 07/2015   a. 07/2015 Posterior STEMI/PCI: LM nl, LAd 40ost, RI 40, RCA 141m(3.0x22 Resolute  Integrity DES distal, 3.0x12 Resolute Integrity DES prox), 90d (PTCA), RPDA small, nl, RPLB2 100 - too small for PTCA. -- patent in f/u cath 08/2015 & 05/2016 - patent RCA stents (~10%), RI 40%, LM ~30% ost LAD ~40%.  . Chronic combined systolic and diastolic CHF (congestive heart failure) ([place] 07/2015   a. 07/2015 Ehco: EF 45-50%, basal-mid inf and infsept AK, inflat, apical inf, and apical septal HK, Gr1 DD, triv MR.;; b - Echo 05/2016 -> EF 20-25%, Gr 2 DD.  Severe diffuse HK disproportionately severe HK and thinning of apex, anterior septum and anterior wall-with septal dyssynchrony.  PAP ~53 mmHg.  .Marland KitchenHyperlipidemia with target low density lipoprotein (LDL) cholesterol less than 70 mg/dL   . Hypertensive heart disease   . Ischemic cardiomyopathy 07/2015 - 05/2016   a. 07/2015 Echo: EF 45-50%.; Follow-up Echo 08/23/2015: EF 50-55% with mild inferior hypokinesis --> 05/2016: EF 20-25%, diffuse HK. PAP ~53 mmHg.  . Kidney disease, chronic, stage [name] (GFR 30-59 ml/min) 08/12/2015  . Morbid obesity ([place]   . ST elevation myocardial infarction (STEMI) of inferior wall ([place] 08/12/2015   Subacute presentation for inferior MI with post infarction angina on the following day. EKG still shows injury current. Therefore decided to proceed with intervention of 100% mRCA thrombotic lesion.  . Sustained ventricular fibrillation ([place] 08/22/2015   Underwent EP evaluation with final ICD implantation.   Past Surgical History:  Past Surgical History:  Procedure Laterality Date  . CARDIAC CATHETERIZATION N/A 08/12/2015   Procedure: Left Heart Cath and Coronary Angiography;  Surgeon: DLeonie Man MD;  [facility];  Service: Cardiovascular: Inferior STEMI: 100% mid RCA,  90% distal RCA, 100% RPL. 40% ostial and proximal LAD and branch of ramus.  [name] CARDIAC CATHETERIZATION N/A 08/26/2015   Procedure: Left Heart Cath and Coronary Angiography;  Surgeon: Belva Crome, MD;  Location: [facility];   Service: Cardiovascular: To evaluate sustained VT. Widely patent RCA stent and distal PTCA site. Also patent RPL branch. LAD lesion appeared to be more consistent with 50-55% and 40%. Otherwise stable from previous cath. EF estimated 35-45%.  [name] CARDIOVERSION N/A 08/23/2015   Procedure: CARDIOVERSION;  Surgeon: Deboraha Sprang, MD;  Location: [facility];  Service: Cardiovascular;  Laterality: N/A;  . CORONARY ANGIOPLASTY WITH STENT PLACEMENT  08/12/2015   Mid RCA 100% reduced to 0% with 2 overlapping Resolute DES.  3.0 x 22 mm with a 3.0 x 12 mm prox overlarp (postdilated to 3.4 mm); PTCA of dRCA 90%. (Very dificult,complex case - tortuous, [place] RCA - unable to advance longer stents.  RPL2 noted to have thromboembolic 397% occlusion - unable to reach.  . ELECTROPHYSIOLOGIC STUDY N/A 08/26/2015   Procedure: Electrophysiology Study;  Surgeon: Deboraha Sprang, MD;  Location: [facility];  Service: Cardiovascular;  Laterality: N/A;  . EP IMPLANTABLE DEVICE N/A 08/26/2015   Procedure: ICD Implant;  Surgeon: Deboraha Sprang, MD;  Location: [facility];  Service: Cardiovascular;  Laterality: LEFT:  St Jude ICD, serial number [id number].   [name] HEMORRHOID SURGERY    . RIGHT/LEFT HEART CATH AND CORONARY ANGIOGRAPHY N/A 06/10/2016   Procedure: Right/Left Heart Cath and Coronary Angiography;  Surgeon: Martinique, Peter M, MD;  Location: [facility] INVASIVE CV LAB:  Nonobstructive CAD. RCA stents PATENT.  Ost-Prox RCA, 10 %. Ost LM 30 %. Ost LAD 40 %. RI, 40 %; High PCWP  & LVEDP (23 mmHg) ; PAP 43/23/31 mmHg. CO/CI 3.89 / 1/5 (Severely Reduced)  . SKIN SURGERY    . TRANSTHORACIC ECHOCARDIOGRAM  08/13/2015; 08/23/2015   a. Mild concentric LVH. EF 45-50%. Basal-mid inferior and inferoseptal akinesis with hypokinesis of inferolateral and apical inferior wall. 1 DD. ;; b. Technically difficult study. Definity contrast administered.  Compared to a prior echo in 07/2015, the LVEF is slightly higher at 50-55% with mild inferior  hypokinesis.  . TRANSTHORACIC ECHOCARDIOGRAM  06/09/2016   EF  20-25%, GR 2 DD. Severe diffuse  hypokinesis with distinct regional wall motion abnormalities.   There is disproportionately severe hypokinesis and thinning of the apex, anterior septum and anterior wall (septal dyssynchrony). However, there is severe dyssynchrony , making it difficult to assess regional function.  PA P ~53 mmHg  . TRANSTHORACIC ECHOCARDIOGRAM  01/2017   Severely reduced EF.  GI to DD.  Moderate MR.  Mild to have a moderately elevated PAP (peak 35mHg)    Assessment & Plan Clinical Impression: Leonard [name] is a 73year old right-handed male with history of CAD/myocardial infarction//PTCA/Saint Jude ICD, left bundle branch block, ischemic cardiomyopathy ejection fraction of 15 to 20%, CKD stage [name], lower extremity venous stasis changes , as well as alcohol use.  Per chart review and patient, patient lives with spouse. 1 level home 3 steps to entry.  Walks with a cane prior to admission.  He was able to perform his own ADLs.  Presented 12/17/2018 with generalized weakness and hypotensive. Chemistries upon admission to the ED showed a sodium 133, potassium 5.3, BUN 41, creatinine 1.18, SARS coronavirus negative, WBC 14,800, urinalysis negative nitrite, CK 119.  Cranial CT unremarkable for acute changes. Chest x-ray hazy interstitial opacities and  central cuffing consistent with interstitial edema.  No pleural effusion.  Cardiology services consulted for acute on chronic systolic congestive heart failure.  Initially maintained on milrinone for blood pressure.  A follow-up echo completed, showing EF of 15-20%. His digoxin and Entresto were held due to elevated creatinine with latest creatinine 1.90.  Patient currently remains on aspirin as well as Brilinta.  Noted stasis changes lower extremities versus cellulitis and placed on Keflex for coverage.  Unna boots were added for lower extremity ischemic changes.  Subcutaneous  heparin for DVT prophylaxis.  Tolerating a regular diet with fluid restrictions  Patient currently requires max with basic self-care skills secondary to muscle weakness, decreased cardiorespiratoy endurance, delayed processing and decreased standing balance, decreased postural control and decreased balance strategies.  Prior to hospitalization, patient could complete BADL with modified independent .  Patient will benefit from skilled intervention to decrease level of assist with basic self-care skills and increase independence with basic self-care skills prior to discharge home with care partner.  Anticipate patient will require 24 hour supervision and follow up home health.  OT - End of Session Activity Tolerance: Tolerates 30+ min activity with multiple rests Endurance Deficit: Yes OT Assessment Rehab Potential (ACUTE ONLY): Good OT Barriers to Discharge: Decreased caregiver support OT Barriers to Discharge Comments: wife works during day OT Patient demonstrates impairments in the following area(s): Balance;Cognition;Edema;Endurance;Safety;Sensory;Skin Integrity OT Basic ADL's Functional Problem(s): Grooming;Bathing;Dressing;Toileting OT Transfers Functional Problem(s): Toilet;Tub/Shower OT Plan OT Intensity: Minimum of 1-2 x/day, 45 to 90 minutes OT Frequency: 5 out of 7 days OT Duration/Estimated Length of Stay: 14-18 days OT Treatment/Interventions: Balance/vestibular training;Discharge planning;Pain management;Therapeutic Activities;Self Care/advanced ADL retraining;UE/LE Coordination activities;Cognitive remediation/compensation;Disease mangement/prevention;Functional mobility training;Patient/family education;Skin care/wound managment;Therapeutic Exercise;Community reintegration;DME/adaptive equipment instruction;Neuromuscular re-education;Psychosocial support;Splinting/orthotics;UE/LE Strength taining/ROM;Wheelchair propulsion/positioning OT Self Feeding Anticipated Outcome(s): no  goal OT Basic Self-Care Anticipated Outcome(s): S OT Toileting Anticipated Outcome(s): S OT Bathroom Transfers Anticipated Outcome(s): S OT Recommendation Patient destination: Home Follow Up Recommendations: Home health OT Equipment Recommended: 3 in 1 bedside comode;Tub/shower bench     Skilled Therapeutic Intervention 1:1. Pt received in bed agreeable to session. Pt familiar with OT services as daughter had tetraplegia from diving accident. Edu re POC, CIR, ELOS and goals. Pt bathes and dresses from seated level on EOB with A to wash back and buttocks. Una boots present to keep from washing B feet. Pt unable to don LB clothing requiring total A at sit to stand level and requires MIN A to don shirt. Pt given MAX A for sit to stand in stedy to cleanse bottom, change soiled brief and advance pants past hips. Exited session with pt seated in bed, exit alarm on and call light inreach  OT Evaluation Precautions/Restrictions  Precautions Precautions: Fall Precaution Comments: watch BP and HR Restrictions Weight Bearing Restrictions: No General Chart Reviewed: Yes Family/Caregiver Present: No Vital Signs  Pain Pain Assessment Pain Score: 0-No pain Home Living/Prior Functioning Home Living Family/patient expects to be discharged to:: Private residence Living Arrangements: Spouse/significant other Available Help at Discharge: Family, Available PRN/intermittently Type of Home: House Home Access: Stairs to enter [employer] of Steps: 3 Entrance Stairs-Rails: Right, Left, Can reach both Home Layout: One level Bathroom Shower/Tub: Chiropodist: Standard Bathroom Accessibility: Yes  Lives With: Spouse IADL History Homemaking Responsibilities: Yes Meal Prep Responsibility: Secondary Laundry Responsibility: Secondary Cleaning Responsibility: Secondary Bill Paying/Finance Responsibility: Secondary Shopping Responsibility: Secondary Current License:  Yes Mode of Transportation: Car Occupation: 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 Hobbies: reading;  follow sports "religiously" Prior Function Level of Independence: Independent with basic ADLs, Independent with homemaking with ambulation, Requires assistive device for independence(SPC) Comments: 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 ADL   Vision Baseline Vision/History: No visual deficits Patient Visual Report: No change from baseline Vision Assessment?: No apparent visual deficits Additional Comments: able to see clock on wall and read menu Perception  Perception: Within Functional Limits Praxis Praxis: Intact Cognition Overall Cognitive Status: Impaired/Different from baseline Arousal/Alertness: Awake/alert Orientation Level: Person;Place;Situation Person: Oriented Place: Oriented Situation: Oriented Year: 2020 Month: December Day of Week: Correct Memory: Appears intact Immediate Memory Recall: Sock;Blue;Bed Memory Recall Sock: Without Cue Memory Recall Blue: Without Cue Memory Recall Bed: Without Cue Attention: Sustained Sustained Attention: Appears intact Sensation Sensation Light Touch: Impaired by gross assessment(occasional tingling in toes) Coordination Gross Motor Movements are Fluid and Coordinated: Yes Fine Motor Movements are Fluid and Coordinated: Yes Motor  Motor Motor: Abnormal postural alignment and control Mobility    MAX A sit to stand in stedy  Trunk/Postural Assessment  Cervical Assessment Cervical Assessment: Exceptions to WFL(head forward) Thoracic Assessment Thoracic Assessment: Exceptions to WFL(rounded shoulders) Lumbar Assessment Lumbar Assessment: Exceptions to WFL(post pelvic tilt)  Balance Balance Balance Assessed: (P) Yes Extremity/Trunk Assessment RUE Assessment RUE Assessment: Exceptions to [facility] General Strength Comments: generalized weakness LUE Assessment LUE Assessment: Exceptions to [facility] General Strength Comments: generalized  weakness     Refer to Care Plan for Long Term Goals  Recommendations for other services: Therapeutic Recreation  Pet therapy and Stress management   Discharge Criteria: Patient will be discharged from OT if patient refuses treatment 3 consecutive times without medical reason, if treatment goals not met, if there is a change in medical status, if patient makes no progress towards goals or if patient is discharged from hospital.  The above assessment, treatment plan, treatment alternatives and goals were discussed and mutually agreed upon: by patient  Tonny Branch 12/24/2018, 9:25 AM

## 2018-12-24 NOTE — Progress Notes (Signed)
Pt had 3 incont episodes through the night, dressing changed to stage 2 on pts bottom. Pt had significant bruising to buttocks but unknown how it got there.  Pt has to sites on abd where heparin was administered, both sites soaking through 2-2x2 gauze multiple times. Provider will be notified during rounds.  Pt has 2cm x 1cm unstageable wound to L foot, 2nd toe. Pt denies pain when palpated.

## 2018-12-25 ENCOUNTER — Inpatient Hospital Stay (HOSPITAL_COMMUNITY): Payer: Medicare Other | Admitting: Speech Pathology

## 2018-12-25 DIAGNOSIS — R5381 Other malaise: Secondary | ICD-10-CM | POA: Diagnosis not present

## 2018-12-25 MED ORDER — ENOXAPARIN SODIUM 40 MG/0.4ML ~~LOC~~ SOLN
40.0000 mg | SUBCUTANEOUS | Status: DC
  Administered 2018-12-25 – 2018-12-31 (×7): 40 mg via SUBCUTANEOUS
  Filled 2018-12-25 (×7): qty 0.4

## 2018-12-25 NOTE — Plan of Care (Addendum)
  Problem: RH BOWEL ELIMINATION Goal: RH STG MANAGE BOWEL WITH ASSISTANCE Description: STG Manage Bowel with min Assistance. Outcome: Not Progressing; LBM 12/3; pt having smears 12/5 and 12/6 ; pt refused  laxative when offered .   Problem: RH BLADDER ELIMINATION Goal: RH STG MANAGE BLADDER WITH ASSISTANCE Description: STG Manage Bladder With min Assistance Outcome: Not Progressing; incontinence; abdominal folds healing; interdry changed.

## 2018-12-25 NOTE — Progress Notes (Signed)
   Weight stable. No labs today.   ECG reviewed. Remains with wandering atrial pacemaker. Rate improved on po amio.   HF team will see again in am.   Glori Bickers, MD  11:39 AM

## 2018-12-25 NOTE — Progress Notes (Signed)
[facility] PHYSICAL MEDICINE & REHABILITATION PROGRESS NOTE   Subjective/Complaints:  Appreciate cards note, now on lovenox renal dose  Off milrinone   PT, a PTT nl  ROS- neg CP, SOB, N/V/D  Objective:   No results found. Recent Labs    12/23/18 0430 12/23/18 1741  WBC 11.1* 11.4*  HGB 15.1 16.0  HCT 47.2 49.1  PLT 298 298   Recent Labs    12/23/18 0430 12/23/18 1741  NA 129*  --   K 3.8  --   CL 89*  --   CO2 25  --   GLUCOSE 89  --   BUN 42*  --   CREATININE 1.90* 1.81*  CALCIUM 9.1  --     Intake/Output Summary (Last 24 hours) at 12/25/2018 0854 Last data filed at 12/24/2018 1320 Gross per 24 hour  Intake 240 ml  Output -  Net 240 ml     Physical Exam: Vital Signs Blood pressure 99/75, pulse 94, temperature 97.7 F (36.5 C), resp. rate 16, height 6' (1.829 m), weight 121.4 kg, SpO2 97 %.  General: No acute distress Mood and affect are appropriate Heart: Regular rate and rhythm no rubs murmurs or extra sounds Lungs: Clear to auscultation, breathing unlabored, no rales or wheezes Abdomen: Positive bowel sounds, soft nontender to palpation, nondistended LLQ hematoma, dried blood on 2x2  Extremities: No clubbing, cyanosis, or edema Skin: No evidence of breakdown, no evidence of rash Neurologic: Cranial nerves II through XII intact, motor strength is 5/5 in bilateral deltoid, bicep, tricep, grip,4/6  hip flexor, knee extensors, ankle dorsiflexor and plantar flexor Sensory exam normal sensation to light touch and proprioception in bilateral upper and lower extremities Cerebellar exam normal finger to nose to finger as well as heel to shin in bilateral upper and lower extremities Musculoskeletal: Full range of motion in all 4 extremities. No joint swelling    Assessment/Plan: 1. Functional deficits secondary to debility  which require 3+ hours per day of interdisciplinary therapy in a comprehensive inpatient rehab setting.  Physiatrist is providing close  team supervision and 24 hour management of active medical problems listed below.  Physiatrist and rehab team continue to assess barriers to discharge/monitor patient progress toward functional and medical goals  Care Tool:  Bathing    Body parts bathed by patient: Right arm, Left arm, Chest, Abdomen, Front perineal area, Right upper leg, Left upper leg, Face   Body parts bathed by helper: Buttocks, Right lower leg, Left lower leg     Bathing assist Assist Level: Maximal Assistance - Patient 24 - 49%     Upper Body Dressing/Undressing Upper body dressing   What is the patient wearing?: Hospital gown only    Upper body assist Assist Level: Minimal Assistance - Patient > 75%    Lower Body Dressing/Undressing Lower body dressing      What is the patient wearing?: Hospital gown only     Lower body assist Assist for lower body dressing: Total Assistance - Patient < 25%     Toileting Toileting Toileting Activity did not occur Landscape architect and hygiene only): N/A (no void or bm)  Toileting assist Assist for toileting: 2 Helpers     Transfers Chair/bed transfer  Transfers assist     Chair/bed transfer assist level: Moderate Assistance - Patient 50 - 74%     Locomotion Ambulation   Ambulation assist      Assist level: Moderate Assistance - Patient 50 - 74% Assistive device: Parallel bars Max  distance: 2   Walk 10 feet activity   Assist  Walk 10 feet activity did not occur: Safety/medical concerns        Walk 50 feet activity   Assist Walk 50 feet with 2 turns activity did not occur: Safety/medical concerns         Walk 150 feet activity   Assist Walk 150 feet activity did not occur: Safety/medical concerns         Walk 10 feet on uneven surface  activity   Assist Walk 10 feet on uneven surfaces activity did not occur: Safety/medical concerns         Wheelchair     Assist   Type of Wheelchair: Manual    Wheelchair  assist level: Minimal Assistance - Patient > 75% Max wheelchair distance: 50    Wheelchair 50 feet with 2 turns activity    Assist        Assist Level: Minimal Assistance - Patient > 75%   Wheelchair 150 feet activity     Assist  Wheelchair 150 feet activity did not occur: Safety/medical concerns       Blood pressure 99/75, pulse 94, temperature 97.7 F (36.5 C), resp. rate 16, height 6' (1.829 m), weight 121.4 kg, SpO2 97 %.  Medical Problem List and Plan: 1.  Debility secondary to acute on chronic systolic congestive heart failure with history of CAD, ischemic cardiomyopathy ejection fraction 15 to 20%, off milrinone, will d/c PICC              -patient may may not shower             -ELOS/Goals: 14-17 days. Supervision/Min A    CIR PT, OT  2.  Antithrombotics: -DVT/anticoagulation: Subcutaneous heparin- will hold today due to hematoma , no further bleeding at heparin injection site  PT.PTT normal , per cards renal dosing for lovenox              -antiplatelet therapy: Aspirin 81 mg daily, Brilinta 90 mg twice daily 3. Pain Management: Tylenol as needed 4. Mood: Prozac 10 mg daily             -antipsychotic agents: N/A 5. Neuropsych: This patient is capable of making decisions on his own behalf. 6. Skin/Wound Care: Routine skin checks/ UNNA boots as directed 7. Fluids/Electrolytes/Nutrition: Routine in and outs. CMP ordered.  8.  CKD stage III.  Labs ordered. 9.  Hypotension.  Digoxin and Entresto on hold as well as Bidil with follow-up per cardiology services.             Monitor with increased mobility.  10.  Left bundle branch block.  Patient currently on amiodarone 200 mg twice daily.  Follow-up in EP lab Dr. Caryl Comes 11.  Hyperlipidemia.  Lipitor 12. Combined CHF             Daily weights    LOS: 2 days A FACE TO FACE EVALUATION WAS PERFORMED  Charlett Blake 12/25/2018, 8:54 AM

## 2018-12-26 ENCOUNTER — Encounter (HOSPITAL_COMMUNITY): Payer: Self-pay | Admitting: *Deleted

## 2018-12-26 ENCOUNTER — Inpatient Hospital Stay (HOSPITAL_COMMUNITY): Payer: Medicare Other | Admitting: Speech Pathology

## 2018-12-26 ENCOUNTER — Inpatient Hospital Stay (HOSPITAL_COMMUNITY): Payer: Medicare Other

## 2018-12-26 ENCOUNTER — Other Ambulatory Visit: Payer: Self-pay

## 2018-12-26 DIAGNOSIS — R5381 Other malaise: Secondary | ICD-10-CM | POA: Diagnosis not present

## 2018-12-26 LAB — COMPREHENSIVE METABOLIC PANEL
ALT: 19 U/L (ref 0–44)
AST: 30 U/L (ref 15–41)
Albumin: 2.9 g/dL — ABNORMAL LOW (ref 3.5–5.0)
Alkaline Phosphatase: 131 U/L — ABNORMAL HIGH (ref 38–126)
Anion gap: 13 (ref 5–15)
BUN: 39 mg/dL — ABNORMAL HIGH (ref 8–23)
CO2: 24 mmol/L (ref 22–32)
Calcium: 8.9 mg/dL (ref 8.9–10.3)
Chloride: 96 mmol/L — ABNORMAL LOW (ref 98–111)
Creatinine, Ser: 1.57 mg/dL — ABNORMAL HIGH (ref 0.61–1.24)
GFR calc Af Amer: 49 mL/min — ABNORMAL LOW (ref >60)
GFR calc non Af Amer: 42 mL/min — ABNORMAL LOW (ref >60)
Glucose, Bld: 112 mg/dL — ABNORMAL HIGH (ref 70–99)
Potassium: 3.6 mmol/L (ref 3.5–5.1)
Sodium: 133 mmol/L — ABNORMAL LOW (ref 135–145)
Total Bilirubin: 1.5 mg/dL — ABNORMAL HIGH (ref 0.3–1.2)
Total Protein: 6.3 g/dL — ABNORMAL LOW (ref 6.5–8.1)

## 2018-12-26 LAB — CBC WITH DIFFERENTIAL/PLATELET
Abs Immature Granulocytes: 0.05 10*3/uL (ref 0.00–0.07)
Basophils Absolute: 0.1 10*3/uL (ref 0.0–0.1)
Basophils Relative: 1 %
Eosinophils Absolute: 0.3 10*3/uL (ref 0.0–0.5)
Eosinophils Relative: 3 %
HCT: 48.3 % (ref 39.0–52.0)
Hemoglobin: 15.5 g/dL (ref 13.0–17.0)
Immature Granulocytes: 1 %
Lymphocytes Relative: 17 %
Lymphs Abs: 1.7 10*3/uL (ref 0.7–4.0)
MCH: 28.4 pg (ref 26.0–34.0)
MCHC: 32.1 g/dL (ref 30.0–36.0)
MCV: 88.6 fL (ref 80.0–100.0)
Monocytes Absolute: 0.7 10*3/uL (ref 0.1–1.0)
Monocytes Relative: 7 %
Neutro Abs: 6.9 10*3/uL (ref 1.7–7.7)
Neutrophils Relative %: 71 %
Platelets: 295 10*3/uL (ref 150–400)
RBC: 5.45 MIL/uL (ref 4.22–5.81)
RDW: 15.8 % — ABNORMAL HIGH (ref 11.5–15.5)
WBC: 9.6 10*3/uL (ref 4.0–10.5)
nRBC: 0 % (ref 0.0–0.2)

## 2018-12-26 MED ORDER — TORSEMIDE 20 MG PO TABS
20.0000 mg | ORAL_TABLET | Freq: Every day | ORAL | Status: DC
  Administered 2018-12-26 – 2019-01-01 (×7): 20 mg via ORAL
  Filled 2018-12-26 (×7): qty 1

## 2018-12-26 NOTE — Progress Notes (Signed)
Social Work Assessment and Plan   Patient Details  Name: Leonard [name] MRN: [id number] Date of Birth: 06-01-1945  Today's Date: 12/26/2018  Problem List:  Patient Active Problem List   Diagnosis Date Noted  . Debility 12/23/2018  . Left bundle branch block   . Palliative care by specialist   . DNR (do not resuscitate) discussion   . Pressure injury of skin 12/18/2018  . Generalized weakness 12/17/2018  . Excessive drinking of alcohol 12/17/2018  . Lower extremity cellulitis 12/17/2018  . Depression 06/14/2018  . Need for shingles vaccine 06/14/2018  . AKI (acute kidney injury) ([place]) 06/11/2016  . S/P implantation of automatic cardioverter/defibrillator (AICD)   . Grief 02/27/2016  . Ischemic cardiomyopathy   . Morbid obesity ([place])   . Chronic combined systolic and diastolic CHF (congestive heart failure) ([place])   . Sustained ventricular fibrillation ([place]) 08/20/2015  . CKD (chronic kidney disease), stage III 08/14/2015  . Elevated glucose 08/14/2015  . ST elevation myocardial infarction (STEMI) of true posterior wall, subsequent episode of care ([place]) 08/12/2015  . Coronary artery disease involving native coronary artery with angina pectoris ([place]) 08/12/2015  . Essential hypertension 02/12/2015  . Hyperlipidemia LDL goal <70 02/12/2015  . Erectile dysfunction 02/12/2015  . BPH (benign prostatic hyperplasia) 02/12/2015   Past Medical History:  Past Medical History:  Diagnosis Date  . AICD (automatic cardioverter/defibrillator) present 08/26/2015   STJ  . Anxiety   . Atherosclerotic heart disease of native coronary artery with angina pectoris ([place]) 07/2015   a. 07/2015 Posterior STEMI/PCI: LM nl, LAd 40ost, RI 40, RCA 112m (3.0x22 Resolute Integrity DES distal, 3.0x12 Resolute Integrity DES prox), 90d (PTCA), RPDA small, nl, RPLB2 100 - too small for PTCA. -- patent in f/u cath 08/2015 & 05/2016 - patent RCA stents (~10%), RI 40%, LM ~30% ost LAD ~40%.  . Chronic combined  systolic and diastolic CHF (congestive heart failure) ([place]) 07/2015   a. 07/2015 Ehco: EF 45-50%, basal-mid inf and infsept AK, inflat, apical inf, and apical septal HK, Gr1 DD, triv MR.;; b - Echo 05/2016 -> EF 20-25%, Gr 2 DD.  Severe diffuse HK disproportionately severe HK and thinning of apex, anterior septum and anterior wall-with septal dyssynchrony.  PAP ~53 mmHg.  [name] Hyperlipidemia with target low density lipoprotein (LDL) cholesterol less than 70 mg/dL   . Hypertensive heart disease   . Ischemic cardiomyopathy 07/2015 - 05/2016   a. 07/2015 Echo: EF 45-50%.; Follow-up Echo 08/23/2015: EF 50-55% with mild inferior hypokinesis --> 05/2016: EF 20-25%, diffuse HK. PAP ~53 mmHg.  . Kidney disease, chronic, stage III (GFR 30-59 ml/min) 08/12/2015  . Morbid obesity ([name])   . ST elevation myocardial infarction (STEMI) of inferior wall ([place]) 08/12/2015   Subacute presentation for inferior MI with post infarction angina on the following day. EKG still shows injury current. Therefore decided to proceed with intervention of 100% mRCA thrombotic lesion.  . Sustained ventricular fibrillation ([place]) 08/22/2015   Underwent EP evaluation with final ICD implantation.   Past Surgical History:  Past Surgical History:  Procedure Laterality Date  . CARDIAC CATHETERIZATION N/A 08/12/2015   Procedure: Left Heart Cath and Coronary Angiography;  Surgeon: Leonie Man, MD;  Location: [place] CV LAB;  Service: Cardiovascular: Inferior STEMI: 100% mid RCA, 90% distal RCA, 100% RPL. 40% ostial and proximal LAD and branch of ramus.  [name] CARDIAC CATHETERIZATION N/A 08/26/2015   Procedure: Left Heart Cath and Coronary Angiography;  Surgeon: Belva Crome, MD;  Location: [facility]  INVASIVE CV LAB;  Service: Cardiovascular: To evaluate sustained VT. Widely patent RCA stent and distal PTCA site. Also patent RPL branch. LAD lesion appeared to be more consistent with 50-55% and 40%. Otherwise stable from previous cath. EF estimated  35-45%.  [name] CARDIOVERSION N/A 08/23/2015   Procedure: CARDIOVERSION;  Surgeon: Deboraha Sprang, MD;  Location: [place];  Service: Cardiovascular;  Laterality: N/A;  . CORONARY ANGIOPLASTY WITH STENT PLACEMENT  08/12/2015   Mid RCA 100% reduced to 0% with 2 overlapping Resolute DES.  3.0 x 22 mm with a 3.0 x 12 mm prox overlarp (postdilated to 3.4 mm); PTCA of dRCA 90%. (Very dificult,complex case - tortuous, [place] RCA - unable to advance longer stents.  RPL2 noted to have thromboembolic 659% occlusion - unable to reach.  . ELECTROPHYSIOLOGIC STUDY N/A 08/26/2015   Procedure: Electrophysiology Study;  Surgeon: Deboraha Sprang, MD;  Location: [place] CV LAB;  Service: Cardiovascular;  Laterality: N/A;  . EP IMPLANTABLE DEVICE N/A 08/26/2015   Procedure: ICD Implant;  Surgeon: Deboraha Sprang, MD;  Location: [place] CV LAB;  Service: Cardiovascular;  Laterality: LEFT:  St Jude ICD, serial number [id number].   [name] HEMORRHOID SURGERY    . RIGHT/LEFT HEART CATH AND CORONARY ANGIOGRAPHY N/A 06/10/2016   Procedure: Right/Left Heart Cath and Coronary Angiography;  Surgeon: Martinique, Peter M, MD;  Location[facility] INVASIVE CV LAB:  Nonobstructive CAD. RCA stents PATENT.  Ost-Prox RCA, 10 %. Ost LM 30 %. Ost LAD 40 %. RI, 40 %; High PCWP  & LVEDP (23 mmHg) ; PAP 43/23/31 mmHg. CO/CI 3.89 / 1/5 (Severely Reduced)  . SKIN SURGERY    . TRANSTHORACIC ECHOCARDIOGRAM  08/13/2015; 08/23/2015   a. Mild concentric LVH. EF 45-50%. Basal-mid inferior and inferoseptal akinesis with hypokinesis of inferolateral and apical inferior wall. 1 DD. ;; b. Technically difficult study. Definity contrast administered.  Compared to a prior echo in 07/2015, the LVEF is slightly higher at 50-55% with mild inferior hypokinesis.  . TRANSTHORACIC ECHOCARDIOGRAM  06/09/2016   EF  20-25%, GR 2 DD. Severe diffuse  hypokinesis with distinct regional wall motion abnormalities.   There is disproportionately severe hypokinesis and thinning of the apex,  anterior septum and anterior wall (septal dyssynchrony). However, there is severe dyssynchrony , making it difficult to assess regional function.  PA P ~53 mmHg  . TRANSTHORACIC ECHOCARDIOGRAM  01/2017   Severely reduced EF.  GI to DD.  Moderate MR.  Mild to have a moderately elevated PAP (peak 87mmHg)   Social History:  reports that he has never smoked. He has never used smokeless tobacco. He reports current alcohol use of about 3.0 - 4.0 standard drinks of alcohol per week. He reports that he does not use drugs.  Family / Support Systems Marital Status: Married How Long?: 2 yrs Patient Roles: Spouse, Parent Spouse/Significant Other: wife, [name] @ [phone] Children: daughter, [employer] [employer] (local) Anticipated Caregiver: [name] (spouse) and hired caregivers Ability/Limitations of Caregiver: works during the day, plans for 24/7 at discharge Caregiver Availability: Evenings only Family Dynamics: Pt describes wife as very supportive, however, she does still work f/t.  Good support of daughter who is local as well.  Social History Preferred language: English Religion: Episcopalian Cultural Background: NA Read: Yes Write: Yes Employment Status: Employed Name of Employer: still Loss adjuster, chartered "but cutting it down" Return to Work Plans: TBD Public relations account executive Issues: None Guardian/Conservator: none -per MD, pt is capable of making decisions on his own behalf.  Abuse/Neglect Abuse/Neglect Assessment Can Be Completed: Yes Physical Abuse: Denies Verbal Abuse: Denies Sexual Abuse: Denies Exploitation of patient/patient's resources: Denies Self-Neglect: Denies  Emotional Status Pt's affect, behavior and adjustment status: Pt sitting up in w/c.  Flat affect, however, fairly engaged and able to complete assessment interview without any difficulty.  He is frustrated with his level of debility and eager to d/c ASAP.  Denies any significant emotional distress, however, may benefit from  neuropsychology consult while here. will monitor. Recent Psychosocial Issues: none Psychiatric History: none Substance Abuse History: none  Patient / Family Perceptions, Expectations & Goals Pt/Family understanding of illness & functional limitations: Pt states, "I don't really know what all happened.  I just started building up a lot of fluid and I couldn't get around at all."  He does note significant cardiac h/o and understands his current debiliyt related to CHF. 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 Anticipated changes in roles/activities/participation: Per goals of supervision, wife may need to provide this in addition to possible private duty care. Pt/family expectations/goals: "I just want to get home."  [employer]: None Premorbid Home Care/DME Agencies: None Transportation available at discharge: yes Resource referrals recommended: Neuropsychology  Discharge Planning Living Arrangements: Spouse/significant other Support Systems: Spouse/significant other, Children Type of Residence: Private residence Insurance Resources: [employer], Multimedia programmer (specify) Financial Resources: Employment, Radio broadcast assistant Screen Referred: No Living Expenses: Own Money Management: Patient Does the patient have any problems obtaining your medications?: No Home Management: pt and spouse Patient/Family Preliminary Plans: Pt to d/c home with wife who can provide intermittent support.  Local daughter may also be available to assist. Social Work Anticipated Follow Up Needs: HH/OP Expected length of stay: 12-14 days  Clinical Impression Pleasant gentleman here for debility following CHF exacerbation.  Living with wife who does work days.  Pt may need 24/7 support and he notes that his local daughter can also assist.  Pt very eager to d/c ASAP.  Denies any significant emotional distress.  Will follow  for support and d/c planning needs.  Gumecindo Hopkin 12/26/2018, 2:47 PM

## 2018-12-26 NOTE — Progress Notes (Addendum)
Advanced Heart Failure Rounding Note  PCP-Cardiologist: Glenetta Hew, MD  AHF: Dr. Haroldine Laws   Subjective:    Off milrinone. Now in CIR.  No co-ox drawn.   Wt has been stable past 72 hr at 266-267lb. Per MAR, has not been getting torsemide. Not ordered.  Creatinine gradually improving 2.12 -> 1.90 -> 1.8->1.5   Amio started 12/4 for MAT.   No cardiac complaints. Resting comfortably in bed. States rehab is going ok.    Objective:   Weight Range: 121.1 kg Body mass index is 36.21 kg/m.   Vital Signs:   Temp:  [97.6 F (36.4 C)-98.4 F (36.9 C)] 97.8 F (36.6 C) (12/07 0451) Pulse Rate:  [96-101] 99 (12/07 0451) Resp:  [16-18] 16 (12/07 0451) BP: (89-103)/(65-79) 101/79 (12/07 0451) SpO2:  [96 %-99 %] 97 % (12/07 0451) Weight:  [121.1 kg] 121.1 kg (12/07 0451) Last BM Date: 12/25/18  Weight change: Filed Weights   12/24/18 0346 12/25/18 0423 12/26/18 0451  Weight: 120.9 kg 121.4 kg 121.1 kg    Intake/Output:   Intake/Output Summary (Last 24 hours) at 12/26/2018 5621 Last data filed at 12/25/2018 1700 Gross per 24 hour  Intake 476 ml  Output -  Net 476 ml      Physical Exam   PHYSICAL EXAM:  General:  Chronically ill appearing, obese WM. No respiratory difficulty HEENT: normal Neck: supple. no JVD. Carotids 2+ bilat; no bruits. No lymphadenopathy or thyromegaly appreciated. Cor: PMI nondisplaced. Regular rate & rhythm. No rubs, gallops or murmurs. Lungs: clear Abdomen: soft, nontender, nondistended. No hepatosplenomegaly. No bruits or masses. Good bowel sounds. Extremities: no cyanosis, clubbing, rash, 1+ bilateral edema, bilateral unna boots  Neuro: alert & oriented x 3, cranial nerves grossly intact. moves all 4 extremities w/o difficulty. Affect pleasant.  Telemetry   Off tele  (in CIR)  Labs    CBC Recent Labs    12/23/18 1741 12/26/18 0340  WBC 11.4* 9.6  NEUTROABS  --  6.9  HGB 16.0 15.5  HCT 49.1 48.3  MCV 87.1 88.6  PLT 298  308   Basic Metabolic Panel Recent Labs    12/23/18 1741 12/26/18 0340  NA  --  133*  K  --  3.6  CL  --  96*  CO2  --  24  GLUCOSE  --  112*  BUN  --  39*  CREATININE 1.81* 1.57*  CALCIUM  --  8.9   Liver Function Tests Recent Labs    12/26/18 0340  AST 30  ALT 19  ALKPHOS 131*  BILITOT 1.5*  PROT 6.3*  ALBUMIN 2.9*   No results for input(s): LIPASE, AMYLASE in the last 72 hours. Cardiac Enzymes No results for input(s): CKTOTAL, CKMB, CKMBINDEX, TROPONINI in the last 72 hours.  BNP: BNP (last 3 results) Recent Labs    12/18/18 0456  BNP 2,328.4*    ProBNP (last 3 results) No results for input(s): PROBNP in the last 8760 hours.   D-Dimer No results for input(s): DDIMER in the last 72 hours. Hemoglobin A1C No results for input(s): HGBA1C in the last 72 hours. Fasting Lipid Panel No results for input(s): CHOL, HDL, LDLCALC, TRIG, CHOLHDL, LDLDIRECT in the last 72 hours. Thyroid Function Tests No results for input(s): TSH, T4TOTAL, T3FREE, THYROIDAB in the last 72 hours.  Invalid input(s): FREET3  Other results:   Imaging    No results found.   Medications:     Scheduled Medications: . amiodarone  200 mg Oral BID  . aspirin EC  81 mg Oral Daily  . atorvastatin  80 mg Oral q1800  . Chlorhexidine Gluconate Cloth  6 each Topical Daily  . enoxaparin (LOVENOX) injection  40 mg Subcutaneous Q24H  . FLUoxetine  10 mg Oral Daily  . Living Better with Heart Failure Book   Does not apply Once  . sodium chloride flush  10-40 mL Intracatheter Q12H  . ticagrelor  90 mg Oral BID    Infusions:   PRN Medications: acetaminophen, sodium chloride flush, sorbitol    Patient Profile   73 y/o male with multiple medical issues including ischemic CM with severe LV dysfunction with EF 15-20%. Has had severe HF for several years with low output on RHC in 2018. Admitted for a/c systolic HF w/ marked volume overload and bilateral LE cellulitis.    Assessment/Plan    1. 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  Admitted w/ NYHA IIIb symptoms and massive volume overload. BNP 2328.  ECHO this admit EF 10-15% RV moderately entarged. RA/LA severely dilated.  Has St Jude ICD .  - Wt stable past 72 hrs. Renal fx improving. Per MAR, has not been getting torsemide. Not ordered. Continue to monitor daily wts closely and if trending up, would have low threshold to resume diuretics.  - Off  digoxin and Entresto due to elevated creatinine. BP too soft to restart entresto currently  - BP too soft for Bidil  - No  blocker due to low output - No SGLt2 with chronic rash under his pannus.    2. AKI: baseline SCr ~1.3. SCr 1.8 on admit in the setting of a/c CHF w/ volume overload. 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  - Creatinine peaked at 2.1 due to overdiuresis. Gradually trending down to 1.8>>1.57. Will follow.   3. Bilateral LE Cellulitis:  - completed Keflex  - resolved  4. Hyponatremia: - Sodium 133 today - Limit free water   5. CAD: hx of STEMI 2017 s/p DES LAD, non-obs dz at cath 2018 - No s/s ischemia - Continue medical therapy: ASA, Brilinta and statin. No  blocker due to low output.   6. LBBB  Back in 2019 Dr Caryl Comes had planned to upgrade to CRT-D. We will need to follow up with EP.  QRS 192 ms  - Currently not candidate for upgrade due to LE wounds and risk of infections  7. MAT - EP has seen. Amio started 12/4   8. Deconditioning  - Appreciate CIR's help   9. DVT prophylaxis - start enox    Length of Stay: [address], PA-C  12/26/2018, 7:12 AM  Advanced Heart Failure Team Pager [phone] (M-F; 7a - 4p)  Please contact [facility] for night-coverage after hours (4p -7a ) and weekends on [email or web address]   Patient seen and examined with the above-signed Advanced Practice Provider and/or Housestaff. I personally reviewed laboratory data, imaging studies and relevant notes.  I independently examined the patient and formulated the important aspects of the plan. I have edited the note to reflect any of my changes or salient points. I have personally discussed the plan with the patient and/or family.  Stable from cardiac perspective. MAT improved with amio. Weight stable off torsemide but we need to restart. Will order. Would likely benefit from CRT upgrade once LE sores heal and he is o/w stable. Continue rehab.  Glori Bickers, MD  8:46 AM

## 2018-12-26 NOTE — IPOC Note (Signed)
Overall Plan of Care [facility]) Patient Details Name: Leonard [name] MRN: [id number] DOB: Sep 29, 1945  Admitting Diagnosis: Debility  Hospital Problems: Principal Problem:   Debility     Functional Problem List: Nursing Behavior, Bowel, Endurance, Edema, Motor, Pain, Perception, Safety, Skin Integrity  PT Endurance, Balance, Motor  OT Balance, Cognition, Edema, Endurance, Safety, Sensory, Skin Integrity  SLP    TR         Basic ADL's: OT Grooming, Bathing, Dressing, Toileting     Advanced  ADL's: OT       Transfers: PT Bed Mobility, Bed to Chair, Teacher, early years/pre, Tub/Shower     Locomotion: PT Ambulation, Stairs, Wheelchair Mobility     Additional Impairments: OT    SLP        TR      Anticipated Outcomes Item Anticipated Outcome  Self Feeding no goal  Swallowing      Basic self-care  S  Toileting  S   Bathroom Transfers S  Bowel/Bladder  manage bowel and bladder with min assist  Transfers  mod I transfers  Locomotion  mod I gait and stairs  Communication     Cognition     Pain  pain level less than 4 on sclale of 0-10  Safety/Judgment  rremain free of injury, prevent falls with cues and reminders   Therapy Plan: PT Intensity: Minimum of 1-2 x/day ,45 to 90 minutes PT Frequency: 5 out of 7 days PT Duration Estimated Length of Stay: 12 to 14 days OT Intensity: Minimum of 1-2 x/day, 45 to 90 minutes OT Frequency: 5 out of 7 days OT Duration/Estimated Length of Stay: 14-18 days     Due to the current state of emergency, patients may not be receiving their 3-hours of Medicare-mandated therapy.   Team Interventions: Nursing Interventions Patient/Family Education, Bladder Management, Pain Management, Skin Care/Wound Management, Psychosocial Support, Cognitive Remediation/Compensation, Bowel Management, Medication Management  PT interventions Ambulation/gait training, Balance/vestibular training, Discharge planning, Patient/family education,  Therapeutic Exercise, Therapeutic Activities, Stair training, Neuromuscular re-education, UE/LE Coordination activities, UE/LE Strength taining/ROM, Wheelchair propulsion/positioning  OT Interventions Balance/vestibular training, Discharge planning, Pain management, Therapeutic Activities, Self Care/advanced ADL retraining, UE/LE Coordination activities, Cognitive remediation/compensation, Disease mangement/prevention, Functional mobility training, Patient/family education, Skin care/wound managment, Therapeutic Exercise, Community reintegration, DME/adaptive equipment instruction, Neuromuscular re-education, Psychosocial support, Splinting/orthotics, UE/LE Strength taining/ROM, Wheelchair propulsion/positioning  SLP Interventions    TR Interventions    SW/CM Interventions Discharge Planning, Psychosocial Support, Patient/Family Education   Barriers to Discharge MD  Medical stability, Wound care, Weight, Medication compliance and Behavior  Nursing      PT Inaccessible home environment, Decreased caregiver support    OT Decreased caregiver support wife works during day  SLP      SW       Team Discharge Planning: Destination: PT-Home ,OT- Home , SLP-Home Projected Follow-up: PT-Home health PT, OT-  Home health OT, SLP-None Projected Equipment Needs: PT-To be determined, OT- 3 in 1 bedside comode, Tub/shower bench, SLP-None recommended by SLP Equipment Details: PT- , OT-  Patient/family involved in discharge planning: PT- Patient,  OT-Patient, SLP-Patient  MD ELOS: 12-14 days Medical Rehab Prognosis:  Fair Assessment: Pt here with debility due to CHF and EF of 15-20%, on Renal dose lovenox for DVT prophylaxis, HTN with hypotension and meds on hold due to soft BP, CKD stage [name]- with Cr back down to 1.57- probably affected by cardiorenal syndrome, L bundle branch block, HLD, combined CHF with weight stable last few days.  Goals- supervision to mod I if stays awake- within 12-14  days   See Team Conference Notes for weekly updates to the plan of care

## 2018-12-26 NOTE — Progress Notes (Signed)
Inpatient Rehabilitation  Patient information reviewed and entered into eRehab system by Treyvon Blahut M. Trestin Vences, M.A., CCC/SLP, PPS Coordinator.  Information including medical coding, functional ability and quality indicators will be reviewed and updated through discharge.    

## 2018-12-26 NOTE — Progress Notes (Addendum)
Physical Therapy Session Note  Patient Details  Name: Leonard [name] MRN: [id number] Date of Birth: 1945-11-29  Today's Date: 12/26/2018 PT Individual Time: 0800-0915 PT Individual Time Calculation (min): 75 min   Short Term Goals: Week 1:  PT Short Term Goal 1 (Week 1): Pt will increase bed moiblity to S. PT Short Term Goal 2 (Week 1): Pt will increae transfers to c/g. PT Short Term Goal 3 (Week 1): Pt will ambulate about 50 feet with rolling walker and c/g. PT Short Term Goal 4 (Week 1): Pt will ascend/descend 4 stairs with B rails and c/g. PT Short Term Goal 5 (Week 1): Pt will propel w/c about 100 feet with S.  Skilled Therapeutic Interventions/Progress Updates:     Patient in bed asleep upon PT arrival. Patient difficult to arouse this morning to verbal and tactile stimuli, he would open his eyes, nod, then close them again. Vitals : BP 105/78 HR 91 SPO2 97%. MD rounded while patient still in bed and made aware. Patient eventually verbalized some words to the MD and was agreeable to PT session. Patient became alert once in sitting and remained alert throughout session.   Therapeutic Activity: Bed Mobility: Patient performed supine to sit with min-mod A with HOB elevated due to lethargy in supine. Provided verbal cues for sliding LE off the bed then pushing through UE to sit up. Transfers: Patient performed sit to/from stand x2 and stand pivot x1 with mod-min on first attempt from the bed then min-CGA . Provided verbal cues for hand placement on RW, leaning far forward to stand, and reaching back to sit.  Gait Training:  Patient ambulated 16 feet x2 using RW with min A for balance. Ambulated with decreased gait speed, decreased step length and height, increased hip and knee flexion, forward trunk lean, and downward head gaze. Provided verbal cues for erect posture and increased step height.  Neuromuscular Re-ed: Patient performed sitting balance on EOB while eating breakfast and  taking morning medications with supervision for safety. Reported mild fatigue sitting without back support after 10 min. Provided cues for erect posture in sitting to activate trunk muscles and improved posture for swallowing. Educated on POC, and expectations for rehab while patient ate.   Patient expressed concerns about coping with the loss of his wife and daughter within the last 5 years and his health. PT educated on positive coping strategies through healthy eating, exercise, and sleeping habits and reaching out for support both personally and through medical resources to assist with developing these habits. Patient very receptive to all education.   Patient in w/c in room at end of session with breaks locked, chair alarm set, and all needs within reach.    Therapy Documentation Precautions:  Precautions Precautions: Fall Precaution Comments: watch BP and HR Restrictions Weight Bearing Restrictions: No    Therapy/Group: Individual Therapy  Chala Gul L Darthy Manganelli PT, DPT  12/26/2018, 12:40 PM

## 2018-12-26 NOTE — Care Management (Signed)
[facility] Individual Statement of Services  Patient Name:  Leonard [name]  Date:  12/26/2018  Welcome to the [facility].  Our goal is to provide you with an individualized program based on your diagnosis and situation, designed to meet your specific needs.  With this comprehensive rehabilitation program, you will be expected to participate in at least 3 hours of rehabilitation therapies Monday-Friday, with modified therapy programming on the weekends.  Your rehabilitation program will include the following services:  Physical Therapy (PT), Occupational Therapy (OT), Speech Therapy (ST), 24 hour per day rehabilitation nursing, Neuropsychology, Case Management (Social Worker), Rehabilitation Medicine, Nutrition Services and Pharmacy Services  Weekly team conferences will be held on Tuesdays to discuss your progress.  Your Social Worker will talk with you frequently to get your input and to update you on team discussions.  Team conferences with you and your family in attendance may also be held.  Expected length of stay: 12-14 days   Overall anticipated outcome: supervision  Depending on your progress and recovery, your program may change. Your Social Worker will coordinate services and will keep you informed of any changes. Your Social Worker's name and contact numbers are listed  below.  The following services may also be recommended but are not provided by the [facility] will be made to provide these services after discharge if needed.  Arrangements include referral to agencies that provide these services.  Your insurance has been verified to be:  Medicare; [place] Your primary doctor is:  Raenette Rover  Pertinent information will be shared with your doctor and your insurance company.  Social Worker:  [place], [place] or [phone]   Information discussed with and copy given to patient by: Lennart Pall, 12/26/2018, 5:36 PM

## 2018-12-26 NOTE — Progress Notes (Signed)
Occupational Therapy Session Note  Patient Details  Name: Leonard [name] MRN: [id number] Date of Birth: [birth year]  Today's Date: 12/26/2018 OT Individual Time: 1000-1100 OT Individual Time Calculation (min): 60 min    Short Term Goals: Week 1:  OT Short Term Goal 1 (Week 1): Pt will sit to stand with LRAD and MOD A of 1 caregiver to decrease burden of care OT Short Term Goal 2 (Week 1): Pt will transfer to toilet wiht MOD A and LRAD OT Short Term Goal 3 (Week 1): Pt will thread BLE into pants and AE PRN  Skilled Therapeutic Interventions/Progress Updates:    OT intervention with focus on BADL training including bathing/dressing with sit<>stand from w/c at sink.  Pt performed sit<>stand at sink with min A X 4 during session.  Pt did not have any clothing and donned paper scrubs. Pt required assistance with bathing periarea and unable to bathe BLE secondary to Unna boots. Pt required assistance threading BLE into pants and commented that he was having difficulty with this task before current admission. Pt requires more than a reasonable amount of time to complete tasks. Pt remained in w/c with all needs within reach and seat alarm activated.   Therapy Documentation Precautions:  Precautions Precautions: Fall Precaution Comments: watch BP and HR Restrictions Weight Bearing Restrictions: No   Pain: Pain Assessment Pain Scale: 0-10 Pain Score: 0-No pain   Therapy/Group: Individual Therapy  [name] 12/26/2018, 11:03 AM

## 2018-12-26 NOTE — Progress Notes (Signed)
Responded to consult for PICC line removal. Due to therapy schedule, RN advises best time for this will be after 11am and before next therapy scheduled at 2pm. VAST will return at later time.

## 2018-12-26 NOTE — Plan of Care (Signed)
  Problem: Consults Goal: RH GENERAL PATIENT EDUCATION Description: See Patient Education module for education specifics. Outcome: Progressing   Problem: RH BOWEL ELIMINATION Goal: RH STG MANAGE BOWEL WITH ASSISTANCE Description: STG Manage Bowel with min Assistance. Outcome: Progressing   Problem: RH BLADDER ELIMINATION Goal: RH STG MANAGE BLADDER WITH ASSISTANCE Description: STG Manage Bladder With min Assistance Outcome: Progressing   Problem: RH SKIN INTEGRITY Goal: RH STG SKIN FREE OF INFECTION/BREAKDOWN Description: Manage bladder with min assist Outcome: Progressing   Problem: RH SAFETY Goal: RH STG ADHERE TO SAFETY PRECAUTIONS W/ASSISTANCE/DEVICE Description: STG Adhere to Safety Precautions With cues and reminders  Outcome: Progressing Goal: RH STG DECREASED RISK OF FALL WITH ASSISTANCE Description: STG Decreased Risk of Fall With cues and reminders Assistance. Outcome: Progressing   Problem: RH PAIN MANAGEMENT Goal: RH STG PAIN MANAGED AT OR BELOW PT'S PAIN GOAL Description: Pain level less than 4 on scale of 0-10 Outcome: Progressing   Problem: RH KNOWLEDGE DEFICIT GENERAL Goal: RH STG INCREASE KNOWLEDGE OF SELF CARE AFTER HOSPITALIZATION Description: Pt will be able to adhere to medication regimen and dietary restriction with min assist upon discharge.  Outcome: Progressing

## 2018-12-26 NOTE — Progress Notes (Signed)
Occupational Therapy Session Note  Patient Details  Name: Leonard [name] MRN: [id number] Date of Birth: 13-May-1945  Today's Date: 12/26/2018 OT Individual Time: 1400-1430 OT Individual Time Calculation (min): 30 min    Short Term Goals: Week 1:  OT Short Term Goal 1 (Week 1): Pt will sit to stand with LRAD and MOD A of 1 caregiver to decrease burden of care OT Short Term Goal 2 (Week 1): Pt will transfer to toilet wiht MOD A and LRAD OT Short Term Goal 3 (Week 1): Pt will thread BLE into pants and AE PRN  Skilled Therapeutic Interventions/Progress Updates:    Pt resting in w/c upon arrival and agreeable to therapy. OT intervention with focus on sit<>stand and standing balance.  Pt requires mod A for sit<>stand with RW and CGA for sit<>stand when using LUE to assist with pulling on sink.  Pt apprehensive when standing with RW.  Pt performed sit<>stand X3 at sink.  Pt stood approx 90 seconds at sink to facilitate changing of brief and applying powder in groin area. Pt requested to remain in w/c at end of session.  Pt remained in w/c with all needs within reach and seat alarm activated.   Therapy Documentation Precautions:  Precautions Precautions: Fall Precaution Comments: watch BP and HR Restrictions Weight Bearing Restrictions: No   Pain:  Pt states his feet feel tender but no c/o of pain   Therapy/Group: Individual Therapy  [name] 12/26/2018, 2:47 PM

## 2018-12-26 NOTE — Progress Notes (Signed)
[facility] PHYSICAL MEDICINE & REHABILITATION PROGRESS NOTE   Subjective/Complaints: Pt very sedated- wasn't able to wake up easily- PT in room also trying to wake him up.  Said slept so well last night, that can't wake up-  LBM last night per pt.   Has a bleeding rash on LEs- was bleeding little spots on sheets- denies itching.     ROS- neg CP, SOB, N/V/D  Objective:   No results found. Recent Labs    12/23/18 1741 12/26/18 0340  WBC 11.4* 9.6  HGB 16.0 15.5  HCT 49.1 48.3  PLT 298 295   Recent Labs    12/23/18 1741 12/26/18 0340  NA  --  133*  K  --  3.6  CL  --  96*  CO2  --  24  GLUCOSE  --  112*  BUN  --  39*  CREATININE 1.81* 1.57*  CALCIUM  --  8.9    Intake/Output Summary (Last 24 hours) at 12/26/2018 0936 Last data filed at 12/26/2018 0835 Gross per 24 hour  Intake 240 ml  Output -  Net 240 ml     Physical Exam: Vital Signs Blood pressure 101/79, pulse 99, temperature 97.8 F (36.6 C), temperature source Oral, resp. rate 16, height 6' (1.829 m), weight 121.1 kg, SpO2 97 %.  General: No acute distress Lying in bed; doesn't want to wake up; PT in room; kept falling back to sleep after a few seconds; would respond and was cordial but couldn't stay awake, NAD Mood and affect are sedated Heart: Regular rate and rhythm  Lungs: Clear to auscultation, breathing unlabored no  wheezes Abdomen: Positive bowel sounds, soft nontender to palpation, nondistended LLQ hematoma, dried blood on 2x2  Extremities: has unaboots on LEs B/L and rash on LEs that's bleeding little tiny spots onto sheets Skin: dressing on 2nd L toe- otherwise, skin on toes extremely dry; unaboots on B/L LEs Neurologic: Cranial nerves II through XII intact, motor strength is 5/5 in bilateral deltoid, bicep, tricep, grip,4/6  hip flexor, knee extensors, ankle dorsiflexor and plantar flexor Musculoskeletal: Full range of motion in all 4 extremities. No joint  swelling    Assessment/Plan: 1. Functional deficits secondary to debility  which require 3+ hours per day of interdisciplinary therapy in a comprehensive inpatient rehab setting.  Physiatrist is providing close team supervision and 24 hour management of active medical problems listed below.  Physiatrist and rehab team continue to assess barriers to discharge/monitor patient progress toward functional and medical goals  Care Tool:  Bathing    Body parts bathed by patient: Right arm, Left arm, Chest, Abdomen, Front perineal area, Right upper leg, Left upper leg, Face   Body parts bathed by helper: Buttocks, Right lower leg, Left lower leg     Bathing assist Assist Level: Maximal Assistance - Patient 24 - 49%     Upper Body Dressing/Undressing Upper body dressing   What is the patient wearing?: Hospital gown only    Upper body assist Assist Level: Minimal Assistance - Patient > 75%    Lower Body Dressing/Undressing Lower body dressing      What is the patient wearing?: Incontinence brief     Lower body assist Assist for lower body dressing: Maximal Assistance - Patient 25 - 49%     Toileting Toileting Toileting Activity did not occur (Clothing management and hygiene only): N/A (no void or bm)  Toileting assist Assist for toileting: Maximal Assistance - Patient 25 - 49%  Transfers Chair/bed transfer  Transfers assist     Chair/bed transfer assist level: Moderate Assistance - Patient 50 - 74%     Locomotion Ambulation   Ambulation assist      Assist level: Moderate Assistance - Patient 50 - 74% Assistive device: Parallel bars Max distance: 2   Walk 10 feet activity   Assist  Walk 10 feet activity did not occur: Safety/medical concerns        Walk 50 feet activity   Assist Walk 50 feet with 2 turns activity did not occur: Safety/medical concerns         Walk 150 feet activity   Assist Walk 150 feet activity did not occur:  Safety/medical concerns         Walk 10 feet on uneven surface  activity   Assist Walk 10 feet on uneven surfaces activity did not occur: Safety/medical concerns         Wheelchair     Assist   Type of Wheelchair: Manual    Wheelchair assist level: Minimal Assistance - Patient > 75% Max wheelchair distance: 50    Wheelchair 50 feet with 2 turns activity    Assist        Assist Level: Minimal Assistance - Patient > 75%   Wheelchair 150 feet activity     Assist  Wheelchair 150 feet activity did not occur: Safety/medical concerns       Blood pressure 101/79, pulse 99, temperature 97.8 F (36.6 C), temperature source Oral, resp. rate 16, height 6' (1.829 m), weight 121.1 kg, SpO2 97 %.  Medical Problem List and Plan: 1.  Debility secondary to acute on chronic systolic congestive heart failure with history of CAD, ischemic cardiomyopathy ejection fraction 15 to 20%, off milrinone, will d/c PICC              -patient  may not shower             -ELOS/Goals: 14-17 days. Supervision/Min A    CIR PT, OT  2.  Antithrombotics: -DVT/anticoagulation: Subcutaneous heparin- will hold today due to hematoma , no further bleeding at heparin injection site  PT/PTT normal , per cards renal dosing for lovenox              -antiplatelet therapy: Aspirin 81 mg daily, Brilinta 90 mg twice daily 3. Pain Management: Tylenol as needed 4. Mood: Prozac 10 mg daily             -antipsychotic agents: N/A 5. Neuropsych: This patient is capable of making decisions on his own behalf. 6. Skin/Wound Care: Routine skin checks/ UNNA boots as directed 7. Fluids/Electrolytes/Nutrition: Routine in and outs. CMP ordered.  8.  CKD stage III.  Labs ordered.  12/7- Cr down to 1.57 from 1.81- high of 2.12- BUN 39 9.  Hypotension.  Digoxin and Entresto on hold as well as Bidil with follow-up per cardiology services.             Monitor with increased mobility.  10.  Left bundle branch block.   Patient currently on amiodarone 200 mg twice daily.  Follow-up in EP lab Dr. Caryl Comes 11.  Hyperlipidemia.  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

## 2018-12-27 ENCOUNTER — Inpatient Hospital Stay (HOSPITAL_COMMUNITY): Payer: Medicare Other

## 2018-12-27 DIAGNOSIS — R5381 Other malaise: Secondary | ICD-10-CM | POA: Diagnosis not present

## 2018-12-27 LAB — BASIC METABOLIC PANEL
Anion gap: 13 (ref 5–15)
BUN: 42 mg/dL — ABNORMAL HIGH (ref 8–23)
CO2: 22 mmol/L (ref 22–32)
Calcium: 8.8 mg/dL — ABNORMAL LOW (ref 8.9–10.3)
Chloride: 94 mmol/L — ABNORMAL LOW (ref 98–111)
Creatinine, Ser: 1.7 mg/dL — ABNORMAL HIGH (ref 0.61–1.24)
GFR calc Af Amer: 45 mL/min — ABNORMAL LOW (ref >60)
GFR calc non Af Amer: 39 mL/min — ABNORMAL LOW (ref >60)
Glucose, Bld: 133 mg/dL — ABNORMAL HIGH (ref 70–99)
Potassium: 5.9 mmol/L — ABNORMAL HIGH (ref 3.5–5.1)
Sodium: 129 mmol/L — ABNORMAL LOW (ref 135–145)

## 2018-12-27 MED ORDER — HYDROCERIN EX CREA
TOPICAL_CREAM | Freq: Two times a day (BID) | CUTANEOUS | Status: DC
  Administered 2018-12-27 – 2018-12-28 (×4): via TOPICAL
  Administered 2018-12-29: 1 via TOPICAL
  Administered 2018-12-29 – 2019-01-01 (×5): via TOPICAL
  Filled 2018-12-27: qty 113

## 2018-12-27 NOTE — Progress Notes (Signed)
Physical Therapy Session Note  Patient Details  Name: Leonard [name] MRN: [id number] Date of Birth: 07-09-1945  Today's Date: 12/27/2018 PT Individual Time: 0910-1020 PT Individual Time Calculation (min): 70 min   Short Term Goals: Week 1:  PT Short Term Goal 1 (Week 1): Pt will increase bed moiblity to S. PT Short Term Goal 2 (Week 1): Pt will increae transfers to c/g. PT Short Term Goal 3 (Week 1): Pt will ambulate about 50 feet with rolling walker and c/g. PT Short Term Goal 4 (Week 1): Pt will ascend/descend 4 stairs with B rails and c/g. PT Short Term Goal 5 (Week 1): Pt will propel w/c about 100 feet with S.  Skilled Therapeutic Interventions/Progress Updates:     Patient in w/c in room upon PT arrival. Patient alert and agreeable to PT session. Patient denied pain during session.   Therapeutic Activity: Transfers: Patient performed sit to/from stand x5 with min A-CGA using a RW. Provided verbal cues for scooting forward in the seat and leaning forward to stand and reaching back to sit.  Gait Training:  Patient ambulated 47 feet and 54 feet feet using RW with CGA and w/c follow for safety due to decreased activity tolerance. RPE 6/10 after each trial. Ambulated with decreased gait speed, decreased B step length and height, increased hip and knee flexion, forward trunk lean, and downward head gaze. Provided verbal cues for erect posture, looking ahead, and increased step height. He went up/down 2-6" steps using B rails with min A for balance/physical support. Ascending leading with R and descended leading with L with step-to gait pattern and poor clearance of L foot while ascending step. Provided verbal cues for use of UEs to assist with pushing up to the next step due to LE weakness.   Wheelchair Mobility:  Patient propelled wheelchair 55 feet with supervision and significant increased time, limited distance due to UE fatigue. Provided verbal cues for propulsion and turning  technique.   Neuromuscular Re-ed: Patient performed dynamic standing balance shooting a basket ball into a hoop 2x3-4 min with CGA-close supervision for balance/safety. Hoop set to lowest height due to decreased UE ROM. Provided cues for use of ankle and hip strategies to reduce sway and LOB during activity.   Patient in w/c in room at end of session with breaks locked, chair alarm set, and all needs within reach. Educated on energy conservation techniques throughout session.    Therapy Documentation Precautions:  Precautions Precautions: Fall Precaution Comments: watch BP and HR Restrictions Weight Bearing Restrictions: No    Therapy/Group: Individual Therapy  Jasslyn Finkel L Jessiah Steinhart PT, DPT  12/27/2018, 3:35 PM

## 2018-12-27 NOTE — Progress Notes (Addendum)
[facility] PHYSICAL MEDICINE & REHABILITATION PROGRESS NOTE   Subjective/Complaints:  Itching "comes and goes" on lower abdomen- scratches from legs are from "habit" not actually itching.  Pt wants to go home asap- wife works but plans on hiring inhome help and daughter can help he thinks.    ROS- neg CP, SOB, N/V/D  Objective:   No results found. Recent Labs    12/26/18 0340  WBC 9.6  HGB 15.5  HCT 48.3  PLT 295   Recent Labs    12/26/18 0340  NA 133*  K 3.6  CL 96*  CO2 24  GLUCOSE 112*  BUN 39*  CREATININE 1.57*  CALCIUM 8.9    Intake/Output Summary (Last 24 hours) at 12/27/2018 0933 Last data filed at 12/26/2018 1230 Gross per 24 hour  Intake 150 ml  Output -  Net 150 ml     Physical Exam: Vital Signs Blood pressure 99/75, pulse 98, temperature 97.6 F (36.4 C), resp. rate 18, height 6' (1.829 m), weight 127.8 kg, SpO2 97 %.  General: No acute distress Sitting up in bedside chair; eating breakfast- working on it; c/o lower abdomen itching, not leg itching Mood and affect brighter but anxious- wants to go home Heart: Regular rate and rhythm  Lungs: Clear to auscultation, breathing unlabored no  wheezes Abdomen: Positive bowel sounds, soft nontender to palpation, nondistended LLQ hematoma, dried blood on 2x2  Extremities: has unaboots on LEs B/L and rash on LEs that's bleeding little tiny spots onto sheets- entire thigh B/L has unusual rash on it that's covering entire thigh B/L Skin: dressing on 2nd L toe- otherwise, skin on toes extremely dry; unaboots on B/L LEs Neurologic:, motor strength is 5/5 in bilateral deltoid, bicep, tricep, grip,4/6  hip flexor, knee extensors, ankle dorsiflexor and plantar flexor Musculoskeletal: Full range of motion in all 4 extremities. No joint swelling    Assessment/Plan: 1. Functional deficits secondary to debility  which require 3+ hours per day of interdisciplinary therapy in a comprehensive inpatient rehab  setting.  Physiatrist is providing close team supervision and 24 hour management of active medical problems listed below.  Physiatrist and rehab team continue to assess barriers to discharge/monitor patient progress toward functional and medical goals  Care Tool:  Bathing    Body parts bathed by patient: Right arm, Left arm, Chest, Abdomen, Front perineal area, Right upper leg, Left upper leg   Body parts bathed by helper: Buttocks Body parts n/a: Right lower leg, Left lower leg   Bathing assist Assist Level: Minimal Assistance - Patient > 75%     Upper Body Dressing/Undressing Upper body dressing   What is the patient wearing?: Pull over shirt    Upper body assist Assist Level: Minimal Assistance - Patient > 75%    Lower Body Dressing/Undressing Lower body dressing      What is the patient wearing?: Pants, Incontinence brief     Lower body assist Assist for lower body dressing: Moderate Assistance - Patient 50 - 74%     Toileting Toileting Toileting Activity did not occur (Clothing management and hygiene only): N/A (no void or bm)  Toileting assist Assist for toileting: Maximal Assistance - Patient 25 - 49%     Transfers Chair/bed transfer  Transfers assist     Chair/bed transfer assist level: Moderate Assistance - Patient 50 - 74% Chair/bed transfer assistive device: Programmer, multimedia   Ambulation assist      Assist level: Minimal Assistance - Patient > 75%  Assistive device: Walker-rolling Max distance: 16'   Walk 10 feet activity   Assist  Walk 10 feet activity did not occur: Safety/medical concerns  Assist level: Minimal Assistance - Patient > 75% Assistive device: Walker-rolling   Walk 50 feet activity   Assist Walk 50 feet with 2 turns activity did not occur: Safety/medical concerns         Walk 150 feet activity   Assist Walk 150 feet activity did not occur: Safety/medical concerns         Walk 10 feet on  uneven surface  activity   Assist Walk 10 feet on uneven surfaces activity did not occur: Safety/medical concerns         Wheelchair     Assist   Type of Wheelchair: Manual    Wheelchair assist level: Minimal Assistance - Patient > 75% Max wheelchair distance: 50    Wheelchair 50 feet with 2 turns activity    Assist        Assist Level: Minimal Assistance - Patient > 75%   Wheelchair 150 feet activity     Assist  Wheelchair 150 feet activity did not occur: Safety/medical concerns       Blood pressure 99/75, pulse 98, temperature 97.6 F (36.4 C), resp. rate 18, height 6' (1.829 m), weight 127.8 kg, SpO2 97 %.  Medical Problem List and Plan: 1.  Debility secondary to acute on chronic systolic congestive heart failure with history of CAD, ischemic cardiomyopathy ejection fraction 15 to 20%, off milrinone, will d/c PICC              -patient  may not shower             -ELOS/Goals: 14-17 days. Supervision/Min A    CIR PT, OT  2.  Antithrombotics: -DVT/anticoagulation: Subcutaneous heparin- will hold today due to hematoma , no further bleeding at heparin injection site  PT/PTT normal , per cards renal dosing for lovenox              -antiplatelet therapy: Aspirin 81 mg daily, Brilinta 90 mg twice daily 3. Pain Management: Tylenol as needed 4. Mood: Prozac 10 mg daily             -antipsychotic agents: N/A 5. Neuropsych: This patient is capable of making decisions on his own behalf. 6. Skin/Wound Care: Routine skin checks/ UNNA boots as directed 7. Fluids/Electrolytes/Nutrition: Routine in and outs. CMP ordered.  8.  CKD stage III.  Labs ordered.  12/7- Cr down to 1.57 from 1.81- high of 2.12- BUN 39 9.  Hypotension.  Digoxin and Entresto on hold as well as Bidil with follow-up per cardiology services.             Monitor with increased mobility.  10.  Left bundle branch block.  Patient currently on amiodarone 200 mg twice daily.  Follow-up in EP lab Dr.  Caryl Comes 11.  Hyperlipidemia.  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 Leg rash and abdominal itching  12/8- will have entire team assess leg rash for cause and see what can be done for itching  14. Dispo  12/8- pt wants to go home asap-  plans on hiring in-home help at home.   LOS: 4 days A FACE TO FACE EVALUATION WAS PERFORMED  Leonard [name] 12/27/2018, 9:33 AM

## 2018-12-27 NOTE — Progress Notes (Signed)
Occupational Therapy Session Note  Patient Details  Name: Leonard [name] MRN: [id number] Date of Birth: [birth year]  Today's Date: 12/27/2018 OT Individual Time: 0700-0810 OT Individual Time Calculation (min): 70 min    Short Term Goals: Week 1:  OT Short Term Goal 1 (Week 1): Pt will sit to stand with LRAD and MOD A of 1 caregiver to decrease burden of care OT Short Term Goal 2 (Week 1): Pt will transfer to toilet wiht MOD A and LRAD OT Short Term Goal 3 (Week 1): Pt will thread BLE into pants and AE PRN  Skilled Therapeutic Interventions/Progress Updates:    Pt asleep upon arrival but easily aroused.  OT intervention with focus on bed mobility, sit<>stand, standing balance, BADL training, task initiation, activity tolerance, and safety awareness to increase independence with BADLs. Supine>sit EOB with supervision-HOB elevated and use of bed rails. Sit>stand from EOB with min A.  Step pivot transfer to w/c with CGA. Pt engaged in bathing/dressing tasks with sit<>stand from w/c at sink.  Pt incontinent of bladder and bowel and required tot A for cleansing buttocks. Pt requires CGA for sit<>stand from w/c at sink. Introduced Secondary school teacher to assist with threading LLE into pants.  Pt required max demonstration cues for use of reacher with pants. Pt requires max verbal cues for initiation and sequencing with bathing/dressing tasks. Pt remained in w/c eating breakfast. Seat alarm activated and all needs within reach.    Therapy Documentation Precautions:  Precautions Precautions: Fall Precaution Comments: watch BP and HR Restrictions Weight Bearing Restrictions: No   Pain:  Pt denies pain this morning   Therapy/Group: Individual Therapy  [name] 12/27/2018, 8:12 AM

## 2018-12-27 NOTE — Patient Care Conference (Signed)
Inpatient RehabilitationTeam Conference and Plan of Care Update Date: 12/27/2018   Time: 11:35 AM   Patient Name: Leonard [name]      Medical Record Number: [id number]  Date of Birth: 01/06/1946 Sex: Male         Room/Bed: 4M07C/4M07C-01 Payor Info: Payor: MEDICARE / Plan: MEDICARE PART A AND B / Product Type: *No Product type* /    Admit Date/Time:  12/23/2018  4:52 PM  Primary Diagnosis:  Debility  Patient Active Problem List   Diagnosis Date Noted  . Acute on chronic congestive heart failure ([place])   . Debility 12/23/2018  . Left bundle branch block   . Palliative care by specialist   . DNR (do not resuscitate) discussion   . Pressure injury of skin 12/18/2018  . Generalized weakness 12/17/2018  . Excessive drinking of alcohol 12/17/2018  . Lower extremity cellulitis 12/17/2018  . Depression 06/14/2018  . Need for shingles vaccine 06/14/2018  . AKI (acute kidney injury) ([place]) 06/11/2016  . S/P implantation of automatic cardioverter/defibrillator (AICD)   . Grief 02/27/2016  . Ischemic cardiomyopathy   . Morbid obesity ([place])   . Chronic combined systolic and diastolic CHF (congestive heart failure) ([place])   . Sustained ventricular fibrillation ([place]) 08/20/2015  . CKD (chronic kidney disease), stage [name] 08/14/2015  . Elevated glucose 08/14/2015  . ST elevation myocardial infarction (STEMI) of true posterior wall, subsequent episode of care ([place]) 08/12/2015  . Coronary artery disease involving native coronary artery with angina pectoris ([place]) 08/12/2015  . Essential hypertension 02/12/2015  . Hyperlipidemia LDL goal <70 02/12/2015  . Erectile dysfunction 02/12/2015  . BPH (benign prostatic hyperplasia) 02/12/2015    Expected Discharge Date: Expected Discharge Date: 01/04/19  Team Members Present: Physician leading conference: Dr. Courtney Heys Social Worker Present: Lennart Pall, LCSW Nurse Present: Rozetta Nunnery, RN Case Manager: Karene Fry, RN PT Present: Apolinar Junes, PT OT Present: Roanna Epley, [place], OT SLP Present: Jettie Booze, CF-SLP PPS Coordinator present : Gunnar Fusi, SLP     Current Status/Progress Goal Weekly Team Focus  Bowel/Bladder   Incontinet of bowel and bladder. LBM12/7     assess and monitor q shift   Swallow/Nutrition/ Hydration             ADL's   bathing-max A; UB dressing-mod A; LB dressing-max A; sit<>stand-min A; functional transfers-min/mod A; decreased activity tolerance  supervision overall  BADL training, activity tolerance, functoinal transfers, education   Mobility   Min A bed mobility, transfers, gait 20' with RW  Mod I overall  Activity tolerance, functional mobility, gait and stair training, strengthening, balance, patient/caregiver education   Communication             Safety/Cognition/ Behavioral Observations            Pain   c/o of pain/soreness to bilateral legs  5/10. tylenol 650 q6 given for pain relief  2/10  assess and monitor q shift   Skin   Stage 2 on buttocks foam in place. Unstageable on left toe. MASD/swelling to groin. interdry in place under abdomen. Unna boots bilateral legs. Rash/itchy patches all over body  prevent further skin breakdown . Prevent infection. Continue daily dressing changes and administering medications for skin issues as ordered.  assess and monitor q shift. report any abnormal findings.    Rehab Goals Patient on target to meet rehab goals: Yes *See Care Plan and progress notes for long and short-term goals.     Barriers to Discharge  Current Status/Progress Possible Resolutions Date Resolved   Nursing                  PT  Home environment access/layout;Lack of/limited family support  1+2 STE patient's condo, patient's wife works during the day, patient will need to be mod I at home alone at times.              OT                  SLP                SW                Discharge Planning/Teaching Needs:  home with spouse who will  coordinate all needed support.  Teaching needs TBD   Team Discussion: Cardiac debility, has rash, scratches on legs, wife works, dtr and son to help, ? Hire help as needed.  RN - uses eucerin cream for itching, inc B/B, stage 2 on buttocks, has una boots.  OT S goals, transfers min A, LB bathing mod/max A, inc B/B.  PT mod I goals, min/CGA sit to stand, heavy cues, CGA 54' X 2, up and down 2 stpes with min A.   Revisions to Treatment Plan: N/A     Medical Summary Current Status: incontience of bowel and bladder-not concerned about it- stage II buttocks; and una boots on LEs Weekly Focus/Goal: Sup goals- transfers min assist/sit-to stand min assist; bathing mod assist/LB; doesn't initiate at all; PT- mod I/Sup goals; daughter and son-in-law can stay; min-CGA sit-stand; heavy cues  Barriers to Discharge: Behavior;Neurogenic Bowel & Bladder;Incontinence;Wound care;Other (comments);Nutrition means;Home enviroment access/layout;Decreased family/caregiver support  Barriers to Discharge Comments: unaboots on LEs and LE rash- not infectious Possible Resolutions to Barriers: walked 54 ft x2 and stairs x2- min assist-   Continued Need for Acute Rehabilitation Level of Care: The patient requires daily medical management by a physician with specialized training in physical medicine and rehabilitation for the following reasons: Direction of a multidisciplinary physical rehabilitation program to maximize functional independence : Yes Medical management of patient stability for increased activity during participation in an intensive rehabilitation regime.: Yes Analysis of laboratory values and/or radiology reports with any subsequent need for medication adjustment and/or medical intervention. : Yes   I attest that I was present, lead the team conference, and concur with the assessment and plan of the team.   [name] 12/29/2018, 10:37 AM  Team conference was held via web/ teleconference due to  [place] - 19

## 2018-12-27 NOTE — Progress Notes (Addendum)
Advanced Heart Failure Rounding Note  PCP-Cardiologist: Glenetta Hew, MD  AHF: Dr. Haroldine Laws   Subjective:    Off milrinone. Now in CIR.  No co-ox drawn.   Doubt today's wt is accurate, reported at 281 lb (266 lb yesterday). Wt done by bed scale (confirmed this w/ pt). Subjectively, he reports he is doing well. Denies dyspnea. Out of bed and in wheelchair bathing at sink. Torsemide resumed yesterday and he reports good UOP (I/Os not recorded). BMP pending.   Progressing well w/ therapy.    Objective:   Weight Range: 127.8 kg Body mass index is 38.21 kg/m.   Vital Signs:   Temp:  [97.4 F (36.3 C)-97.8 F (36.6 C)] 97.6 F (36.4 C) (12/08 0342) Pulse Rate:  [80-98] 98 (12/08 0342) Resp:  [17-19] 18 (12/08 0342) BP: (87-99)/(69-75) 99/75 (12/08 0342) SpO2:  [97 %-99 %] 97 % (12/08 0342) Weight:  [127.8 kg] 127.8 kg (12/08 0342) Last BM Date: 12/25/18  Weight change: Filed Weights   12/25/18 0423 12/26/18 0451 12/27/18 0342  Weight: 121.4 kg 121.1 kg 127.8 kg    Intake/Output:   Intake/Output Summary (Last 24 hours) at 12/27/2018 0750 Last data filed at 12/26/2018 1230 Gross per 24 hour  Intake 390 ml  Output -  Net 390 ml      Physical Exam   PHYSICAL EXAM: General:  Obese, chronically ill appearing middle aged WM. Smells of urine. No respiratory difficulty HEENT: normal Neck: supple. no JVD. Carotids 2+ bilat; no bruits. No lymphadenopathy or thyromegaly appreciated. Cor: PMI nondisplaced. Regular rate & rhythm. No rubs, gallops or murmurs. Lungs: clear no wheeze Abdomen: obese, soft, nontender, nondistended. No hepatosplenomegaly. No bruits or masses. Good bowel sounds. Extremities: no cyanosis, clubbing, rash, 1+ bilateral LE edema, bilateral unna boots + excoriations Neuro: alert & oriented x 3, cranial nerves grossly intact. moves all 4 extremities w/o difficulty. Affect pleasant.   Telemetry   Off tele  (in CIR)  Labs    CBC Recent  Labs    12/26/18 0340  WBC 9.6  NEUTROABS 6.9  HGB 15.5  HCT 48.3  MCV 88.6  PLT 962   Basic Metabolic Panel Recent Labs    12/26/18 0340  NA 133*  K 3.6  CL 96*  CO2 24  GLUCOSE 112*  BUN 39*  CREATININE 1.57*  CALCIUM 8.9   Liver Function Tests Recent Labs    12/26/18 0340  AST 30  ALT 19  ALKPHOS 131*  BILITOT 1.5*  PROT 6.3*  ALBUMIN 2.9*   No results for input(s): LIPASE, AMYLASE in the last 72 hours. Cardiac Enzymes No results for input(s): CKTOTAL, CKMB, CKMBINDEX, TROPONINI in the last 72 hours.  BNP: BNP (last 3 results) Recent Labs    12/18/18 0456  BNP 2,328.4*    ProBNP (last 3 results) No results for input(s): PROBNP in the last 8760 hours.   D-Dimer No results for input(s): DDIMER in the last 72 hours. Hemoglobin A1C No results for input(s): HGBA1C in the last 72 hours. Fasting Lipid Panel No results for input(s): CHOL, HDL, LDLCALC, TRIG, CHOLHDL, LDLDIRECT in the last 72 hours. Thyroid Function Tests No results for input(s): TSH, T4TOTAL, T3FREE, THYROIDAB in the last 72 hours.  Invalid input(s): FREET3  Other results:   Imaging    No results found.   Medications:     Scheduled Medications: . amiodarone  200 mg Oral BID  . aspirin EC  81 mg Oral Daily  .  atorvastatin  80 mg Oral q1800  . enoxaparin (LOVENOX) injection  40 mg Subcutaneous Q24H  . FLUoxetine  10 mg Oral Daily  . Living Better with Heart Failure Book   Does not apply Once  . sodium chloride flush  10-40 mL Intracatheter Q12H  . ticagrelor  90 mg Oral BID  . torsemide  20 mg Oral Daily    Infusions:   PRN Medications: acetaminophen, sodium chloride flush, sorbitol    Patient Profile   73 y/o male with multiple medical issues including ischemic CM with severe LV dysfunction with EF 15-20%. Has had severe HF for several years with low output on RHC in 2018. Admitted for a/c systolic HF w/ marked volume overload and bilateral LE cellulitis.    Assessment/Plan    1. 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  Admitted w/ NYHA IIIb symptoms and massive volume overload. BNP 2328.  ECHO this admit EF 10-15% RV moderately entarged. RA/LA severely dilated.  Has St Jude ICD .  - Torsemide was held for several days but resumed 12/7. Doubt today's wt is accurate, reported at 281 lb (266 lb yesterday). Wt done by bed scale (confirmed this w/ pt). Subjectively, he reports he is doing well. Denies dyspnea. He reports good UOP w/ torsemide yesterday (I/Os not recorded). BMP pending to assess SCr and K.   - Continue torsemide daily. Recommend standing scale wts daily and daily BMPs. - Off  digoxin and Entresto due to elevated creatinine. BP too soft to restart entresto currently  - BP too soft for Bidil  - No  blocker due to low output - No SGLt2 with chronic rash under his pannus.    2. AKI: baseline SCr ~1.3. SCr 1.8 on admit in the setting of a/c CHF w/ volume overload. 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  - Creatinine peaked at 2.1 due to overdiuresis. Torsemide resumed yesterday. BMP pending.   3. Bilateral LE Cellulitis:  - completed Keflex  - resolved  4. Hyponatremia: - Sodium 133 yesterday - Limit free water  -f/u BMP pending.  5. CAD: hx of STEMI 2017 s/p DES LAD, non-obs dz at cath 2018 - No s/s ischemia - Continue medical therapy: ASA, Brilinta and statin. No  blocker due to low output.   6. LBBB  Back in 2019 Dr Caryl Comes had planned to upgrade to CRT-D. We will need to follow up with EP.  QRS 192 ms  - Currently not candidate for upgrade due to LE wounds and risk of infections  7. MAT - EP has seen. Amio started 12/4. HR improved    8. Deconditioning  - Appreciate CIR's help   9. DVT prophylaxis - continue enox    Length of Stay: [address], PA-C  12/27/2018, 7:50 AM  Advanced Heart Failure Team Pager [phone] (M-F; 7a - 4p)  Please contact [facility] for  night-coverage after hours (4p -7a ) and weekends on [email or web address]  Patient seen and examined with the above-signed Advanced Practice Provider and/or Housestaff. I personally reviewed laboratory data, imaging studies and relevant notes. I independently examined the patient and formulated the important aspects of the plan. I have edited the note to reflect any of my changes or salient points. I have personally discussed the plan with the patient and/or family.  Progressing nicely with rehab. Seems well compensated. Do not think weight is accurate (please weight on standing scale). MAT improved with amio.   K high today but specimen is hemolyzed.  Creatinine stable Recheck in am.    Glori Bickers, MD  10:15 PM

## 2018-12-27 NOTE — Progress Notes (Signed)
Occupational Therapy Session Note  Patient Details  Name: Leonard [name] MRN: [id number] Date of Birth: 06/11/1945  Today's Date: 12/27/2018 OT Individual Time: 1330-1425 OT Individual Time Calculation (min): 55 min    Short Term Goals: Week 1:  OT Short Term Goal 1 (Week 1): Pt will sit to stand with LRAD and MOD A of 1 caregiver to decrease burden of care OT Short Term Goal 2 (Week 1): Pt will transfer to toilet wiht MOD A and LRAD OT Short Term Goal 3 (Week 1): Pt will thread BLE into pants and AE PRN  Skilled Therapeutic Interventions/Progress Updates:    Pt in [place] upon arrival with NT. Pt states he wants to get back in bed and NT states his brief needs to be changed.  Pt agreeable to therapy when reminded that he has scheduled therapy.  Pt returned to w/c and placed in front of sink to change brief and clean up. Sit<>stand from w/c at sink with CGA/min A.  Pt required assistance for hygiene and donning brief.  Pt able to manage shorts.  Pt confident that his daughter and neighbors will assist him with hygiene if they need to. Discussed home setup and access to bathrooms.  Pt states he can "make it" to the bathroom with his cane.  Reminded pt that therapists are recommending he use a RW. Pt states he has elevated toilet seats with counter and/or sink on either side.  BSC setup in room to simulate home setup.  Pt sit<>stand from w/c with min A and CGA for ambulating to [facility].  Pt states he just had a BM while amb to [facility].  Pt sat on BSC to finish BM and required assistance with hygiene and donning brief. Reminded pt that goal is to be able to make it to the bathroom and perform all toileting tasks without assistance.  Pt repeats that they have "lined up" a variety of neighbors and family member who can/will assist. Pt amb with RW to bed and performs sit>supine with CGA. Pt remained in bed with all needs within reach and bed alarm activated.   Therapy Documentation Precautions:   Precautions Precautions: Fall Precaution Comments: watch BP and HR Restrictions Weight Bearing Restrictions: No   Pain:  Pt denies pain this afternoon   Therapy/Group: Individual Therapy  [name] 12/27/2018, 2:33 PM

## 2018-12-28 ENCOUNTER — Inpatient Hospital Stay (HOSPITAL_COMMUNITY): Payer: Medicare Other

## 2018-12-28 DIAGNOSIS — I509 Heart failure, unspecified: Secondary | ICD-10-CM

## 2018-12-28 LAB — BASIC METABOLIC PANEL
Anion gap: 13 (ref 5–15)
BUN: 45 mg/dL — ABNORMAL HIGH (ref 8–23)
CO2: 22 mmol/L (ref 22–32)
Calcium: 8.9 mg/dL (ref 8.9–10.3)
Chloride: 96 mmol/L — ABNORMAL LOW (ref 98–111)
Creatinine, Ser: 1.61 mg/dL — ABNORMAL HIGH (ref 0.61–1.24)
GFR calc Af Amer: 48 mL/min — ABNORMAL LOW (ref >60)
GFR calc non Af Amer: 41 mL/min — ABNORMAL LOW (ref >60)
Glucose, Bld: 111 mg/dL — ABNORMAL HIGH (ref 70–99)
Potassium: 3.4 mmol/L — ABNORMAL LOW (ref 3.5–5.1)
Sodium: 131 mmol/L — ABNORMAL LOW (ref 135–145)

## 2018-12-28 MED ORDER — POTASSIUM CHLORIDE CRYS ER 20 MEQ PO TBCR
40.0000 meq | EXTENDED_RELEASE_TABLET | Freq: Once | ORAL | Status: AC
  Administered 2018-12-28: 40 meq via ORAL
  Filled 2018-12-28: qty 2

## 2018-12-28 MED ORDER — SPIRONOLACTONE 12.5 MG HALF TABLET
12.5000 mg | ORAL_TABLET | Freq: Every day | ORAL | Status: DC
  Administered 2018-12-28: 12.5 mg via ORAL
  Filled 2018-12-28 (×2): qty 1

## 2018-12-28 NOTE — Progress Notes (Addendum)
Physical Therapy Session Note  Patient Details  Name: Leonard [name] MRN: [id number] Date of Birth: [birth year]  Today's Date: 12/28/2018 PT Individual Time: 0915-1015 PT Individual Time Calculation (min): 60 min   Short Term Goals: Week 1:  PT Short Term Goal 1 (Week 1): Pt will increase bed moiblity to S. PT Short Term Goal 2 (Week 1): Pt will increae transfers to c/g. PT Short Term Goal 3 (Week 1): Pt will ambulate about 50 feet with rolling walker and c/g. PT Short Term Goal 4 (Week 1): Pt will ascend/descend 4 stairs with B rails and c/g. PT Short Term Goal 5 (Week 1): Pt will propel w/c about 100 feet with S.  Skilled Therapeutic Interventions/Progress Updates:     Patient in w/c in room upon PT arrival. Patient alert and agreeable to PT session. Patient denied pain during session. Discussed patient's PT goals and d/c date determined in team conference yesterday. Patient was disappointed he would not be going home sooner. He stated that he didn't understand why he couldn't go home by the weekend. PT explained patient's current level of assistance and fall risk. Patient had difficulty understanding and continued to insist he could walk in his condo with his cane at home now. PT focused session on performing fall risk assessment and comparison of gait training with a SPC versus a RW.  Therapeutic Activity: Transfers: Patient performed sit to/from stand x3 with CGA using a RW. Provided verbal cues for hand placement on RW and reaching back to sit.  Gait Training:  Patient ambulated 20 feet using a SPC with mod-min A and 20 feet with a RW with CGA. Ambulated with decreased step length, gait speed, and intermittent LOB using the SPC versus the RW. Provided verbal cues for sequencing with the SPC. Ask the patient how he felt about each device. He stated that he did feel less stable with the [facility], but continued to insist he would be fine if he were at home. PT educated about level of  assistance and increased fall risk with SPC.   Neuromuscular Re-ed: Patient performed the [facility] Scale, see details below. Patient demonstrates increased fall risk as noted by score of   18/56 on Berg Balance Scale.  (<36= high risk for falls, close to 100%; 37-45 significant >80%; 46-51 moderate >50%; 52-55 lower >25%). Educated patient on score indicating that he was at high fall risk, consequences of falls at home, and discussed PT interventions that he will work on therapy to improve his balance prior to going home.   Patient in w/c in room at end of session with breaks locked, chair alarm set, and all needs within reach.    Therapy Documentation Precautions:  Precautions Precautions: Fall Precaution Comments: watch BP and HR Restrictions Weight Bearing Restrictions: No  Balance: Standardized Balance Assessment: Berg Balance Test Berg Balance Test Sit to Stand: Needs minimal aid to stand or to stabilize Standing Unsupported: Able to stand 2 minutes with supervision Sitting with Back Unsupported but Feet Supported on Floor or Stool: Able to sit safely and securely 2 minutes Stand to Sit: Sits independently, has uncontrolled descent Transfers: Needs one person to assist Standing Unsupported with Eyes Closed: Able to stand 10 seconds with supervision Standing Ubsupported with Feet Together: Needs help to attain position but able to stand for 30 seconds with feet together From Standing, Reach Forward with Outstretched Arm: Reaches forward but needs supervision From Standing Position, Pick up Object from Floor: Unable to try/needs assist to  keep balance From Standing Position, Turn to Look Behind Over each Shoulder: Needs supervision when turning Turn 360 Degrees: Needs assistance while turning Standing Unsupported, Alternately Place Feet on Step/Stool: Needs assistance to keep from falling or unable to try Standing Unsupported, One Foot in Front: Able to take small step  independently and hold 30 seconds Standing on One Leg: Unable to try or needs assist to prevent fall Total Score: 18/56   Therapy/Group: Individual Therapy  Nichlos Kunzler L Delois Silvester PT, DPT  12/28/2018, 12:49 PM

## 2018-12-28 NOTE — Plan of Care (Signed)
  Problem: Consults Goal: RH GENERAL PATIENT EDUCATION Description: See Patient Education module for education specifics. Outcome: Progressing   Problem: RH BOWEL ELIMINATION Goal: RH STG MANAGE BOWEL WITH ASSISTANCE Description: STG Manage Bowel with min Assistance. Outcome: Progressing   Problem: RH BLADDER ELIMINATION Goal: RH STG MANAGE BLADDER WITH ASSISTANCE Description: STG Manage Bladder With min Assistance Outcome: Progressing   Problem: RH SKIN INTEGRITY Goal: RH STG SKIN FREE OF INFECTION/BREAKDOWN Description: Manage bladder with min assist Outcome: Progressing   Problem: RH SAFETY Goal: RH STG ADHERE TO SAFETY PRECAUTIONS W/ASSISTANCE/DEVICE Description: STG Adhere to Safety Precautions With cues and reminders  Outcome: Progressing Goal: RH STG DECREASED RISK OF FALL WITH ASSISTANCE Description: STG Decreased Risk of Fall With cues and reminders Assistance. Outcome: Progressing   Problem: RH PAIN MANAGEMENT Goal: RH STG PAIN MANAGED AT OR BELOW PT'S PAIN GOAL Description: Pain level less than 4 on scale of 0-10 Outcome: Progressing   Problem: RH KNOWLEDGE DEFICIT GENERAL Goal: RH STG INCREASE KNOWLEDGE OF SELF CARE AFTER HOSPITALIZATION Description: Pt will be able to adhere to medication regimen and dietary restriction with min assist upon discharge.  Outcome: Progressing

## 2018-12-28 NOTE — Progress Notes (Signed)
[place] PHYSICAL MEDICINE & REHABILITATION PROGRESS NOTE   Subjective/Complaints:  IPt reports has been able to stop itching completely with use of Eucerin cream.  Feeling good this AM- d/c is next Wednesday. He's aware.   ROS- neg CP, SOB, N/V/D  Objective:   No results found. Recent Labs    12/26/18 0340  WBC 9.6  HGB 15.5  HCT 48.3  PLT 295   Recent Labs    12/27/18 0859 12/28/18 0615  NA 129* 131*  K 5.9* 3.4*  CL 94* 96*  CO2 22 22  GLUCOSE 133* 111*  BUN 42* 45*  CREATININE 1.70* 1.61*  CALCIUM 8.8* 8.9    Intake/Output Summary (Last 24 hours) at 12/28/2018 1845 Last data filed at 12/28/2018 1300 Gross per 24 hour  Intake 240 ml  Output -  Net 240 ml     Physical Exam: Vital Signs Blood pressure 103/76, pulse 93, temperature 97.8 F (36.6 C), resp. rate 18, height 6' (1.829 m), weight 121.8 kg, SpO2 99 %.  General: No acute distress Sitting up in bedside chair; eating breakfast- working on eating- ~ 1/2 way through- denies itching, NAD Mood and affect brighter and less  anxious-  Heart: Regular rate and rhythm  Lungs: Clear to auscultation, breathing unlabored no  wheezes Abdomen: Positive bowel sounds, soft nontender to palpation, nondistended LLQ hematoma Extremities: has unaboots on LEs B/L and rash on LEs has less scratching/excoriation/bleeding on entire thigh B/L has unusual rash on it that's covering entire thigh B/L Skin: dressing on 2nd L toe- otherwise, skin on toes extremely dry; unaboots on B/L LEs Neurologic:, motor strength is 5/5 in bilateral deltoid, bicep, tricep, grip,4/6  hip flexor, knee extensors, ankle dorsiflexor and plantar flexor Musculoskeletal: Full range of motion in all 4 extremities. No joint swelling    Assessment/Plan: 1. Functional deficits secondary to debility  which require 3+ hours per day of interdisciplinary therapy in a comprehensive inpatient rehab setting.  Physiatrist is providing close team  supervision and 24 hour management of active medical problems listed below.  Physiatrist and rehab team continue to assess barriers to discharge/monitor patient progress toward functional and medical goals  Care Tool:  Bathing    Body parts bathed by patient: Right arm, Left arm, Chest, Abdomen, Front perineal area, Right upper leg, Left upper leg, Face   Body parts bathed by helper: Buttocks Body parts n/a: Right lower leg, Left lower leg   Bathing assist Assist Level: Minimal Assistance - Patient > 75%     Upper Body Dressing/Undressing Upper body dressing   What is the patient wearing?: Pull over shirt    Upper body assist Assist Level: Set up assist    Lower Body Dressing/Undressing Lower body dressing      What is the patient wearing?: Incontinence brief, Hospital gown only     Lower body assist Assist for lower body dressing: Total Assistance - Patient < 25%(tot A for donning hospital brief)     Toileting Toileting Toileting Activity did not occur Landscape architect and hygiene only): N/A (no void or bm)  Toileting assist Assist for toileting: Moderate Assistance - Patient 50 - 74%     Transfers Chair/bed transfer  Transfers assist     Chair/bed transfer assist level: Minimal Assistance - Patient > 75%(no AD) Chair/bed transfer assistive device: Programmer, multimedia   Ambulation assist      Assist level: Minimal Assistance - Patient > 75% Assistive device: Walker-rolling Max distance: 58  Walk 10 feet activity   Assist  Walk 10 feet activity did not occur: Safety/medical concerns  Assist level: Minimal Assistance - Patient > 75% Assistive device: Walker-rolling   Walk 50 feet activity   Assist Walk 50 feet with 2 turns activity did not occur: Safety/medical concerns  Assist level: Minimal Assistance - Patient > 75% Assistive device: Walker-rolling    Walk 150 feet activity   Assist Walk 150 feet activity did not occur:  Safety/medical concerns         Walk 10 feet on uneven surface  activity   Assist Walk 10 feet on uneven surfaces activity did not occur: Safety/medical concerns         Wheelchair     Assist   Type of Wheelchair: Manual    Wheelchair assist level: Supervision/Verbal cueing Max wheelchair distance: 50    Wheelchair 50 feet with 2 turns activity    Assist        Assist Level: Supervision/Verbal cueing   Wheelchair 150 feet activity     Assist  Wheelchair 150 feet activity did not occur: Safety/medical concerns       Blood pressure 103/76, pulse 93, temperature 97.8 F (36.6 C), resp. rate 18, height 6' (1.829 m), weight 121.8 kg, SpO2 99 %.  Medical Problem List and Plan: 1.  Debility secondary to acute on chronic systolic congestive heart failure with history of CAD, ischemic cardiomyopathy ejection fraction 15 to 20%, off milrinone, will d/c PICC              -patient  may not shower             -ELOS/Goals: 14-17 days. Supervision/Min A  12/9- set d/c for next Wednesday 12/16    CIR PT, OT  2.  Antithrombotics: -DVT/anticoagulation: Subcutaneous heparin- will hold today due to hematoma , no further bleeding at heparin injection site  PT/PTT normal , per cards renal dosing for lovenox              -antiplatelet therapy: Aspirin 81 mg daily, Brilinta 90 mg twice daily 3. Pain Management: Tylenol as needed 4. Mood: Prozac 10 mg daily             -antipsychotic agents: N/A 5. Neuropsych: This patient is capable of making decisions on his own behalf. 6. Skin/Wound Care: Routine skin checks/ UNNA boots as directed 7. Fluids/Electrolytes/Nutrition: Routine in and outs. CMP ordered.  8.  CKD stage III.  Labs ordered.  12/7- Cr down to 1.57 from 1.81- high of 2.12- BUN 39  12/9- labs in AM 9.  Hypotension.  Digoxin and Entresto on hold as well as Bidil with follow-up per cardiology services.             Monitor with increased mobility.  10.  Left  bundle branch block.  Patient currently on amiodarone 200 mg twice daily.  Follow-up in EP lab Dr. Caryl Comes 11.  Hyperlipidemia.  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 Leg rash and abdominal itching  12/8- will have entire team assess leg rash for cause and see what can be done for itching  14. Dispo  12/8- pt wants  to go home asap- plans on hiring in-home help at home.   12/9- d/c `12/16 is planned  LOS: 5 days A FACE TO FACE EVALUATION WAS PERFORMED  Sora Olivo 12/28/2018, 6:45 PM

## 2018-12-28 NOTE — Progress Notes (Signed)
Physical Therapy Session Note  Patient Details  Name: Leonard [name] MRN: [id number] Date of Birth: Feb 14, 1945  Today's Date: 12/28/2018 PT Individual Time: 1420-1530 PT Individual Time Calculation (min): 70 min        Skilled Therapeutic Interventions/Progress Updates:   Pt seated in w/c.  He denied pain.   W/c propulsion for general endurance, x 50' with supervision. PT wrapped bil brake levers with [place] for better tactile identification; cues for locking/unlocking brakes throughout session.  Seated Therapeutic exercises performed with LEs and trunk to increase strength for functional mobility: 10 x 1 each R/L long arc quad knee extension with ankle pumps at end range, 15 x 1 bil heel raises, bil adductor squeezes against bolster, bil glut sets; 5 x 1 each R /L trunk rotation.   Transfer training for trunk flexion/extensio from sitting,  to transfer wt forward.  Sit> stand with min/mod assist.    Gait training with RW on level tile x  58' with min assist.  Up/down (4) 6" high steps, min/mod assist , leading with R foot ascending step-to method, descending with L foot leading step-to method.  Pt needed cues for sequencing even after PT demo'd and discussed it.  At end of session, pt in wc with [name], NT entering room to care for pt.     Therapy Documentation Precautions:  Precautions Precautions: Fall Precaution Comments: watch BP and HR Restrictions Weight Bearing Restrictions: No        Therapy/Group: Individual Therapy  Aylani Spurlock 12/28/2018, 4:06 PM

## 2018-12-28 NOTE — Progress Notes (Signed)
Occupational Therapy Session Note  Patient Details  Name: Leonard [name] MRN: [id number] Date of Birth: 09-03-1945  Today's Date: 12/28/2018 OT Individual Time: 0700-0755 OT Individual Time Calculation (min): 55 min    Short Term Goals: Week 1:  OT Short Term Goal 1 (Week 1): Pt will sit to stand with LRAD and MOD A of 1 caregiver to decrease burden of care OT Short Term Goal 2 (Week 1): Pt will transfer to toilet wiht MOD A and LRAD OT Short Term Goal 3 (Week 1): Pt will thread BLE into pants and AE PRN  Skilled Therapeutic Interventions/Progress Updates:    Pt resting in bed upon arrival and agreeable to therapy.  OT intervention with focus on bed mobility, functional amb with RW, sit<>stand, standing balance, BADL training, and safety awareness to increase independence with BADLs.  Pt required assitance with cleaning buttocks.  Pt stated he was unable to complete this task at home and usually stood in shower and used hand-held shower head to "clean his bottom." Pt with not clean pants this morning and donned hospital gown. Pt requires more than a reasonable amount of time and min verbal cues for task initiation and sequencing.  Pt insistent he will have assitance at home.  Pt also mentioned that his wife had finally convinced him to wear Depends at home but he wasn't sure he was going to. Pt unrealistic regarding his capabilities.  Pt insistent it will be better at home and he will be able to "do better." Continued education.  Pt remained in w/c with seat alarm activated and all needs within reach.   Therapy Documentation Precautions:  Precautions Precautions: Fall Precaution Comments: watch BP and HR Restrictions Weight Bearing Restrictions: No   Pain:  Pt denies pain this morning   Therapy/Group: Individual Therapy  [name] 12/28/2018, 7:59 AM

## 2018-12-28 NOTE — Progress Notes (Signed)
Patient ID: Leonard [name], male   DOB: 08-03-1945, 73 y.o.   MRN: [id number]  This NP visited patient at the bedside as a follow up to last week's goals of care meeting and to assess for palliative medicine needs and support.  Initially spoke with patient at bedside and then spoke to wife in hallway.  Patient tells me he is "doing well and ready to go home".  Continued discussion regarding current medical situation, goals of care,  advanced directive decisions and anticipatory care needs.  We discussed the seriousness of the patient's current medical situation; ischemic CM with severe LV dysfunction with an EF of 15 to 20%/Saint Jude ICD, acute kidney injury/creatinine today is 1.61, hyponatremia, coronary artery disease, bilateral lower extremity cellulitis and weakness and deconditioning.  Patient and his wife understand the seriousness of his current medical situation. Patient clearly verbalizes his desire for all offered and available medical interventions to prolong life.  He is hopeful for quality of life at home  Plan of care -Patient is open to all offered and available medical interventions to prolong life -Full code, with ICD -Transition home.  His wife is currently looking into in-home nursing aide assistance  (wife verbalizes her concern that the patient will not be compliant with life style, diet, fluid restriction once he gets home) -patient anticipates discharge home early to mid next week  Advanced Directive and Healthcare Power of [place]  paperwork is  in chart.  Discussed with patient the importance of continued conversation with family and the medical providers regarding overall plan of care and treatment options,  ensuring decisions are within the context of the patients values and GOCs.  This nurse practitioner informed  the patient  that I will be out of the hospital until Monday morning.  If the patient is still hospitalized I will follow-up at that time.  Questions  and concerns addressed  Call palliative medicine team phone # [phone] with questions or concerns.     Discussed with LCSW/Lucy  Total time spent on the unit was 35 minutes  Greater than 50% of the time was spent in counseling and coordination of care  Wadie Lessen NP  Palliative Medicine Team Team Phone  Pager [phone]

## 2018-12-28 NOTE — Progress Notes (Signed)
Orthopedic Tech Progress Note Patient Details:  Leonard [name] [birth year] [id number]  Ortho Devices Type of Ortho Device: Unna boot, Ace wrap Ortho Device/Splint Location: Bilateral Ortho Device/Splint Interventions: Application   Post Interventions Patient Tolerated: Well Instructions Provided: Care of device   [name] 12/28/2018, 4:25 PM

## 2018-12-28 NOTE — Progress Notes (Signed)
   HF stable. Weight at baseline. K low.   Will supp K and add low-dose spiro.   Appreciate CIR's help.   Glori Bickers, MD  8:34 AM

## 2018-12-28 NOTE — Progress Notes (Signed)
Orthopedic Tech Progress Note Patient Details:  Leonard [name] Oct 29, 1945 [id number]  Ortho Devices Type of Ortho Device: Unna boot, Ace wrap Ortho Device/Splint Location: Bilateral Ortho Device/Splint Interventions: Application   Post Interventions Patient Tolerated: Well Instructions Provided: Care of device   [name] 12/28/2018, 4:25 PM

## 2018-12-29 ENCOUNTER — Inpatient Hospital Stay (HOSPITAL_COMMUNITY): Payer: Medicare Other

## 2018-12-29 ENCOUNTER — Other Ambulatory Visit: Payer: Self-pay

## 2018-12-29 ENCOUNTER — Inpatient Hospital Stay (HOSPITAL_COMMUNITY): Payer: Medicare Other | Admitting: Physical Therapy

## 2018-12-29 DIAGNOSIS — Z515 Encounter for palliative care: Secondary | ICD-10-CM

## 2018-12-29 DIAGNOSIS — I509 Heart failure, unspecified: Secondary | ICD-10-CM

## 2018-12-29 DIAGNOSIS — Z7189 Other specified counseling: Secondary | ICD-10-CM

## 2018-12-29 LAB — BASIC METABOLIC PANEL
Anion gap: 14 (ref 5–15)
BUN: 49 mg/dL — ABNORMAL HIGH (ref 8–23)
CO2: 24 mmol/L (ref 22–32)
Calcium: 9.1 mg/dL (ref 8.9–10.3)
Chloride: 95 mmol/L — ABNORMAL LOW (ref 98–111)
Creatinine, Ser: 2.02 mg/dL — ABNORMAL HIGH (ref 0.61–1.24)
GFR calc Af Amer: 36 mL/min — ABNORMAL LOW (ref >60)
GFR calc non Af Amer: 31 mL/min — ABNORMAL LOW (ref >60)
Glucose, Bld: 107 mg/dL — ABNORMAL HIGH (ref 70–99)
Potassium: 4.8 mmol/L (ref 3.5–5.1)
Sodium: 133 mmol/L — ABNORMAL LOW (ref 135–145)

## 2018-12-29 NOTE — Progress Notes (Signed)
[place] PHYSICAL MEDICINE & REHABILITATION PROGRESS NOTE   Subjective/Complaints:  Pt reports he couldn't sleep and was up watching TV- concerned about getting COVID in hospital- also wants to go home- has a lot of help at home including someone hired to help- has 1st floor condo that's very accessible as well.   Thinks got unaboots due to swelling of LEs- were changed yesterday bu Ortho tech- was concerned due to mottled skin on LEs.   ROS- neg CP, SOB, N/V/D, HA, abd pain; vision changes Objective:   No results found. No results for input(s): WBC, HGB, HCT, PLT in the last 72 hours. Recent Labs    12/28/18 0615 12/29/18 0519  NA 131* 133*  K 3.4* 4.8  CL 96* 95*  CO2 22 24  GLUCOSE 111* 107*  BUN 45* 49*  CREATININE 1.61* 2.02*  CALCIUM 8.9 9.1    Intake/Output Summary (Last 24 hours) at 12/29/2018 0959 Last data filed at 12/29/2018 0824 Gross per 24 hour  Intake 536 ml  Output -  Net 536 ml     Physical Exam: Vital Signs Blood pressure 111/80, pulse 90, temperature 98.1 F (36.7 C), temperature source Oral, resp. rate 20, height 6' (1.829 m), weight 120.5 kg, SpO2 96 %.  General: No acute distress Sitting up in bedside chair;on phone, appropriate however pushing to go home, NAD Mood and affect brighter but perseverating on d/c home the entire time-  Heart: Regular rate and rhythm ; quiet heart sounds in general Lungs: Clear to auscultation, breathing unlabored no  wheezes Abdomen: Positive bowel sounds, soft, NT, nondistended LLQ hematoma Extremities: has unaboots on LEs B/L- new ones- and rash on LEs has less scratching/excoriation/bleeding on entire thigh B/L has unusual rash on it that's covering entire thigh B/L Skin: dressing on 2nd L toe- otherwise, skin on toes extremely dry; unaboots on B/L LEs Neurologic:, motor strength is 5/5 in bilateral deltoid, bicep, tricep, grip,4/6  hip flexor, knee extensors, ankle dorsiflexor and plantar  flexor Musculoskeletal: Full range of motion in all 4 extremities. No joint swelling    Assessment/Plan: 1. Functional deficits secondary to debility  which require 3+ hours per day of interdisciplinary therapy in a comprehensive inpatient rehab setting.  Physiatrist is providing close team supervision and 24 hour management of active medical problems listed below.  Physiatrist and rehab team continue to assess barriers to discharge/monitor patient progress toward functional and medical goals  Care Tool:  Bathing    Body parts bathed by patient: Right arm, Left arm, Chest, Abdomen, Front perineal area, Right upper leg, Left upper leg, Buttocks   Body parts bathed by helper: Buttocks Body parts n/a: Left lower leg, Face   Bathing assist Assist Level: Minimal Assistance - Patient > 75%     Upper Body Dressing/Undressing Upper body dressing   What is the patient wearing?: Pull over shirt    Upper body assist Assist Level: Set up assist    Lower Body Dressing/Undressing Lower body dressing      What is the patient wearing?: Pants, Incontinence brief     Lower body assist Assist for lower body dressing: Moderate Assistance - Patient 50 - 74%     Toileting Toileting Toileting Activity did not occur (Clothing management and hygiene only): N/A (no void or bm)  Toileting assist Assist for toileting: Moderate Assistance - Patient 50 - 74%     Transfers Chair/bed transfer  Transfers assist     Chair/bed transfer assist level: Minimal Assistance - Patient >  75%(no AD) Chair/bed transfer assistive device: Museum/gallery exhibitions officer assist      Assist level: Minimal Assistance - Patient > 75% Assistive device: Walker-rolling Max distance: 58   Walk 10 feet activity   Assist  Walk 10 feet activity did not occur: Safety/medical concerns  Assist level: Minimal Assistance - Patient > 75% Assistive device: Walker-rolling   Walk 50 feet  activity   Assist Walk 50 feet with 2 turns activity did not occur: Safety/medical concerns  Assist level: Minimal Assistance - Patient > 75% Assistive device: Walker-rolling    Walk 150 feet activity   Assist Walk 150 feet activity did not occur: Safety/medical concerns         Walk 10 feet on uneven surface  activity   Assist Walk 10 feet on uneven surfaces activity did not occur: Safety/medical concerns         Wheelchair     Assist   Type of Wheelchair: Manual    Wheelchair assist level: Supervision/Verbal cueing Max wheelchair distance: 50    Wheelchair 50 feet with 2 turns activity    Assist        Assist Level: Supervision/Verbal cueing   Wheelchair 150 feet activity     Assist  Wheelchair 150 feet activity did not occur: Safety/medical concerns       Blood pressure 111/80, pulse 90, temperature 98.1 F (36.7 C), temperature source Oral, resp. rate 20, height 6' (1.829 m), weight 120.5 kg, SpO2 96 %.  Medical Problem List and Plan: 1.  Debility secondary to acute on chronic systolic congestive heart failure with history of CAD, ischemic cardiomyopathy ejection fraction 15 to 20%, off milrinone, will d/c PICC              -patient  may not shower             -ELOS/Goals: 14-17 days. Supervision/Min A  12/9- set d/c for next Wednesday 12/16  12/10- pt INSISTENT that gets out here before next Wednesday    CIR PT, OT  2.  Antithrombotics: -DVT/anticoagulation: Subcutaneous heparin- will hold today due to hematoma , no further bleeding at heparin injection site  PT/PTT normal , per cards renal dosing for lovenox              -antiplatelet therapy: Aspirin 81 mg daily, Brilinta 90 mg twice daily 3. Pain Management: Tylenol as needed 4. Mood: Prozac 10 mg daily             -antipsychotic agents: N/A 5. Neuropsych: This patient is capable of making decisions on his own behalf. 6. Skin/Wound Care: Routine skin checks/ UNNA boots as  directed 7. Fluids/Electrolytes/Nutrition: Routine in and outs. CMP ordered.  8.  CKD stage III.  Labs ordered.  12/7- Cr down to 1.57 from 1.81- high of 2.12- BUN 39  12/9- labs in AM  12/10- Cr up to 2.02 from 1.61- Heart failure physician held spironolactone due to that and increase in K+ from 3.4 to 4.8 9.  Hypotension.  Digoxin and Entresto on hold as well as Bidil with follow-up per cardiology services.             Monitor with increased mobility.  10.  Left bundle branch block.  Patient currently on amiodarone 200 mg twice daily.  Follow-up in EP lab Dr. Caryl Comes 11.  Hyperlipidemia.  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 Leg rash and abdominal itching  12/8- will have entire team assess leg rash for cause and see what can be done for itching  14. Dispo  12/8- pt wants to go home asap- plans on hiring in-home help at home.   12/9- d/c `12/16 is planned  12/10- pt wants to go home earlier- explained he's at mod A- he doesn't seem to understand   LOS: 6 days A FACE TO FACE EVALUATION WAS PERFORMED  Leonard [name] 12/29/2018, 9:59 AM

## 2018-12-29 NOTE — Evaluation (Signed)
Recreational Therapy Assessment and Plan  Patient Details  Name: Leonard [name] MRN: [id number] Date of Birth: 11/08/1945 Today's Date: 12/29/2018  Rehab Potential: Good ELOS: 12/16   Assessment Problem List:      Patient Active Problem List   Diagnosis Date Noted  . Debility 12/23/2018  . Left bundle branch block   . Palliative care by specialist   . DNR (do not resuscitate) discussion   . Pressure injury of skin 12/18/2018  . Generalized weakness 12/17/2018  . Excessive drinking of alcohol 12/17/2018  . Lower extremity cellulitis 12/17/2018  . Depression 06/14/2018  . Need for shingles vaccine 06/14/2018  . AKI (acute kidney injury) (HCC) 06/11/2016  . S/P implantation of automatic cardioverter/defibrillator (AICD)   . Grief 02/27/2016  . Ischemic cardiomyopathy   . Morbid obesity (HCC)   . Chronic combined systolic and diastolic CHF (congestive heart failure) (HCC)   . Sustained ventricular fibrillation (HCC) 08/20/2015  . CKD (chronic kidney disease), stage III 08/14/2015  . Elevated glucose 08/14/2015  . ST elevation myocardial infarction (STEMI) of true posterior wall, subsequent episode of care (HCC) 08/12/2015  . Coronary artery disease involving native coronary artery with angina pectoris (HCC) 08/12/2015  . Essential hypertension 02/12/2015  . Hyperlipidemia LDL goal <70 02/12/2015  . Erectile dysfunction 02/12/2015  . BPH (benign prostatic hyperplasia) 02/12/2015    Past Medical History:      Past Medical History:  Diagnosis Date  . AICD (automatic cardioverter/defibrillator) present 08/26/2015   STJ  . Anxiety   . Atherosclerotic heart disease of native coronary artery with angina pectoris (HCC) 07/2015   a. 07/2015 Posterior STEMI/PCI: LM nl, LAd 40ost, RI 40, RCA 100m (3.0x22 Resolute Integrity DES distal, 3.0x12 Resolute Integrity DES prox), 90d (PTCA), RPDA small, nl, RPLB2 100 - too small for PTCA. -- patent in f/u cath 08/2015 & 05/2016  - patent RCA stents (~10%), RI 40%, LM ~30% ost LAD ~40%.  . Chronic combined systolic and diastolic CHF (congestive heart failure) (HCC) 07/2015   a. 07/2015 Ehco: EF 45-50%, basal-mid inf and infsept AK, inflat, apical inf, and apical septal HK, Gr1 DD, triv MR.;; b - Echo 05/2016 -> EF 20-25%, Gr 2 DD.  Severe diffuse HK disproportionately severe HK and thinning of apex, anterior septum and anterior wall-with septal dyssynchrony.  PAP ~53 mmHg.  . Hyperlipidemia with target low density lipoprotein (LDL) cholesterol less than 70 mg/dL   . Hypertensive heart disease   . Ischemic cardiomyopathy 07/2015 - 05/2016   a. 07/2015 Echo: EF 45-50%.; Follow-up Echo 08/23/2015: EF 50-55% with mild inferior hypokinesis --> 05/2016: EF 20-25%, diffuse HK. PAP ~53 mmHg.  . Kidney disease, chronic, stage III (GFR 30-59 ml/min) 08/12/2015  . Morbid obesity (HCC)   . ST elevation myocardial infarction (STEMI) of inferior wall (HCC) 08/12/2015   Subacute presentation for inferior MI with post infarction angina on the following day. EKG still shows injury current. Therefore decided to proceed with intervention of 100% mRCA thrombotic lesion.  . Sustained ventricular fibrillation (HCC) 08/22/2015   Underwent EP evaluation with final ICD implantation.   Past Surgical History:       Past Surgical History:  Procedure Laterality Date  . CARDIAC CATHETERIZATION N/A 08/12/2015   Procedure: Left Heart Cath and Coronary Angiography;  Surgeon: David W Harding, MD;  Location: [facility]ASIVE [facility];  Service: Cardiovascular: Inferior STEMI: 100% mid RCA, 90% distal RCA, 100% RPL. 40% ostial and proximal LAD and branch of ramus.  . CARDIAC CATHETERIZATION   N/A 08/26/2015   Procedure: Left Heart Cath and Coronary Angiography;  Surgeon: Belva Crome, MD;  Location: [place] [facility];  Service: Cardiovascular: To evaluate sustained VT. Widely patent RCA stent and distal PTCA site. Also patent RPL branch. LAD lesion appeared to  be more consistent with 50-55% and 40%. Otherwise stable from previous cath. EF estimated 35-45%.  [name] CARDIOVERSION N/A 08/23/2015   Procedure: CARDIOVERSION;  Surgeon: Deboraha Sprang, MD;  Location: [facility];  Service: Cardiovascular;  Laterality: N/A;  . CORONARY ANGIOPLASTY WITH STENT PLACEMENT  08/12/2015   Mid RCA 100% reduced to 0% with 2 overlapping Resolute DES.  3.0 x 22 mm with a 3.0 x 12 mm prox overlarp (postdilated to 3.4 mm); PTCA of dRCA 90%. (Very dificult,complex case - tortuous, [place] RCA - unable to advance longer stents.  RPL2 noted to have thromboembolic 357% occlusion - unable to reach.  . ELECTROPHYSIOLOGIC STUDY N/A 08/26/2015   Procedure: Electrophysiology Study;  Surgeon: Deboraha Sprang, MD;  Location: [place] [facility];  Service: Cardiovascular;  Laterality: N/A;  . EP IMPLANTABLE DEVICE N/A 08/26/2015   Procedure: ICD Implant;  Surgeon: Deboraha Sprang, MD;  Location: [place] [facility];  Service: Cardiovascular;  Laterality: LEFT:  St Jude ICD, serial number [id number].   [name] HEMORRHOID SURGERY    . RIGHT/LEFT HEART CATH AND CORONARY ANGIOGRAPHY N/A 06/10/2016   Procedure: Right/Left Heart Cath and Coronary Angiography;  Surgeon: Martinique, Peter M, MD;  Location: [facility] INVASIVE CV LAB:  Nonobstructive CAD. RCA stents PATENT.  Ost-Prox RCA, 10 %. Ost LM 30 %. Ost LAD 40 %. RI, 40 %; High PCWP  & LVEDP (23 mmHg) ; PAP 43/23/31 mmHg. CO/CI 3.89 / 1/5 (Severely Reduced)  . SKIN SURGERY    . TRANSTHORACIC ECHOCARDIOGRAM  08/13/2015; 08/23/2015   a. Mild concentric LVH. EF 45-50%. Basal-mid inferior and inferoseptal akinesis with hypokinesis of inferolateral and apical inferior wall. 1 DD. ;; b. Technically difficult study. Definity contrast administered.  Compared to a prior echo in 07/2015, the LVEF is slightly higher at 50-55% with mild inferior hypokinesis.  . TRANSTHORACIC ECHOCARDIOGRAM  06/09/2016   EF  20-25%, GR 2 DD. Severe diffuse  hypokinesis with distinct regional  wall motion abnormalities.   There is disproportionately severe hypokinesis and thinning of the apex, anterior septum and anterior wall (septal dyssynchrony). However, there is severe dyssynchrony , making it difficult to assess regional function.  PA P ~53 mmHg  . TRANSTHORACIC ECHOCARDIOGRAM  01/2017   Severely reduced EF.  GI to DD.  Moderate MR.  Mild to have a moderately elevated PAP (peak 49mHg)    Assessment & Plan Clinical Impression: HQuinterius GaidaIII is a 73year old right-handed male with history of CAD/myocardial infarction//PTCA/Saint Jude ICD, left bundle branch block, ischemic cardiomyopathy ejection fraction of 15 to 20%, CKD stage III, lower extremity venous stasis changes , as well as alcohol use. Per chart review and patient, patient lives with spouse. 1 level home 3 steps to entry. Walks with a cane prior to admission. He was able to perform his own ADLs. Presented 12/17/2018 with generalized weakness and hypotensive. Chemistries upon admission to the ED showed a sodium 133, potassium 5.3, BUN 41, creatinine 1.18, SARS coronavirus negative, WBC 14,800, urinalysis negative nitrite, CK 119. Cranial CT unremarkable for acute changes. Chest x-ray hazy interstitial opacities and central cuffing consistent with interstitial edema. No pleural effusion. Cardiology services consulted for acute on chronic systolic congestive heart failure. Initially maintained on  milrinone for blood pressure. A follow-up echo completed, showing EF of 15-20%. His digoxin and Entresto were held due to elevated creatinine with latest creatinine 1.90. Patient currently remains on aspirin as well as Brilinta. Noted stasis changes lower extremities versus cellulitis and placed on Keflex for coverage. Unna boots were added for lower extremity ischemic changes. Subcutaneous heparin for DVT prophylaxis. Tolerating a regular diet with fluid restrictions  Pt presents with decreased activity tolerance,  decreased functional mobility, decreased balance, decreased problem solving Limiting pt's independence with leisure/community pursuits.   Leisure History/Participation Other Leisure Interests: Reading;Videogames(fantasy foot ball) Leisure Participation Style: With Family/Friends;Alone Awareness of Community Resources: Good-identify 3 post discharge leisure resources Psychosocial / Spiritual Stress Management: Fair Methods of Stress Management: leisiure as a distraction/relaxation Social interaction - Mood/Behavior: Cooperative Community Reintegration Appropriate for Education?: Yes Recreational Therapy Orientation Orientation -Reviewed with patient: Available activity resources Strengths/Weaknesses Patient Strengths/Abilities: Willingness to participate Patient weaknesses: Physical limitations TR Patient demonstrates impairments in the following area(s): Endurance;Edema;Pain;Safety  Plan No further TR as pt is now potentially set for discharge home 12/13  Recommendations for other services: None   Discharge Criteria: Patient will be discharged from TR if patient refuses treatment 3 consecutive times without medical reason.  If treatment goals not met, if there is a change in medical status, if patient makes no progress towards goals or if patient is discharged from hospital.  The above assessment, treatment plan, treatment alternatives and goals were discussed and mutually agreed upon: by patient   *Pt stated that he wanted to go home by the weekend and did not feel like he needed to be here.  When questioned further, pt stated that his wife and multiple family members/friends were going to be available to assist him at discharge.  Discussed leisure interests with potential modifications, home safety, discharge planning and current therapy goals.  Shared that if pt met goals sooner than next week, the team would reevaluate his discharge date.  Pt stated understanding.     , 12/29/2018, 3:36 PM  

## 2018-12-29 NOTE — Progress Notes (Signed)
Physical Therapy Session Note  Patient Details  Name: Leonard [name] MRN: [id number] Date of Birth: May 28, 1945  Today's Date: 12/29/2018 PT Individual Time: 1010-1110 and 1400-1430 PT Individual Time Calculation (min): 60 min and 30 min   Short Term Goals: Week 1:  PT Short Term Goal 1 (Week 1): Pt will increase bed moiblity to S. PT Short Term Goal 2 (Week 1): Pt will increae transfers to c/g. PT Short Term Goal 3 (Week 1): Pt will ambulate about 50 feet with rolling walker and c/g. PT Short Term Goal 4 (Week 1): Pt will ascend/descend 4 stairs with B rails and c/g. PT Short Term Goal 5 (Week 1): Pt will propel w/c about 100 feet with S.  Skilled Therapeutic Interventions/Progress Updates:     Session 1: Patient in w/c in room with [name], RT upon PT arrival. Patient alert and agreeable to PT session. Patient denied pain during session. Discussed d/c plan and support at home. Patient continues to ask to go home this weekend. Provided encouragement to perform all functional mobility during session to assess safety at home during session. Patient reported that he was motivated to do well if it meant he could go home sooner.   Therapeutic Activity: Bed Mobility: Patient performed rolling R/L and supine to/from sit with supervision in the bed in the ADL apartment. Provided verbal cues for rolling technique and pushing up to his elbow then hand to sit up. Transfers: Patient performed sit to/from stand x4 and stand pivot x1 with CGA using a RW and a furniture transfer from a recliner with min A. Provided verbal cues for sequencing, hand placement on RW, leaning far forward to stand, and reaching back to sit.  Gait Training:  Patient ambulated 188 feet on level tile surface and 15 feet on carpet in the ADL apartment using RW with CGA for safety/balance. Ambulated with decreased gait speed, decreased step length and height, increased hip and knee flexion, forward trunk lean, and downward head  gaze. Provided verbal cues for looking ahead, erect posture, and increased step height. RPE 2/10 and HR 105 after.  He went up/down 4-6" steps with min A-CGA with a step-to and intermittent reciprocal pattern ascending with B rails and descending using 1 rail with B UE as he does at home. Provided cues for sequencing and clearing L foot while ascending stairs (tends to hit toes on the step).   Patient in w/c in room at end of session with breaks locked, chair alarm set, and all needs within reach. Discussed patient's significant improvement with all mobility this session  Session 2: Patient in w/c in room upon PT arrival. Patient alert and agreeable to PT session. Patient denied pain this session. Reported feeling fatigued this afternoon from sleeping poorly last night and increased activity during therapy this morning.   Therapeutic Activity: Transfers: Patient performed sit to/from stand x2 with supervision using the RW. Provided verbal cues for hand placement and reaching back to sit.  Gait Training:  Patient ambulated 170 feet x2 using RW with close supervision for safety. Ambulated as above requiring the same cues as this morning.   Neuromuscular Re-ed: Patient performed sit to/from stands x6 without the RW with his hands on his thighs for LE strengthening with a functional activity. He required min A x2 then supervision for the last 4 with cues for scooting to the edge of the mat table, placing feet underneath him, leaning far forward, and controlling descent with LEs. He performed standing balance without  UE support 6 x1 min for improved standing tolerance and reduced anterior/posterior sway.   Patient in w/c in room at end of session with breaks locked, chair alarm set, and all needs within reach. Discussed d/c planning and 24/7 support at d/c and will confirm moving d/c date to Sunday based on family education session tomorrow with his wife. Patient in agreement.  Therapy  Documentation Precautions:  Precautions Precautions: Fall Precaution Comments: watch BP and HR Restrictions Weight Bearing Restrictions: No    Therapy/Group: Individual Therapy  Meagan Spease L Marwan Lipe PT, DPT  12/29/2018, 4:06 PM

## 2018-12-29 NOTE — Plan of Care (Signed)
  Problem: Consults Goal: RH GENERAL PATIENT EDUCATION Description: See Patient Education module for education specifics. Outcome: Progressing   Problem: RH BOWEL ELIMINATION Goal: RH STG MANAGE BOWEL WITH ASSISTANCE Description: STG Manage Bowel with min Assistance. Outcome: Progressing   Problem: RH BLADDER ELIMINATION Goal: RH STG MANAGE BLADDER WITH ASSISTANCE Description: STG Manage Bladder With min Assistance Outcome: Progressing   Problem: RH SKIN INTEGRITY Goal: RH STG SKIN FREE OF INFECTION/BREAKDOWN Description: Manage bladder with min assist Outcome: Progressing   Problem: RH SAFETY Goal: RH STG ADHERE TO SAFETY PRECAUTIONS W/ASSISTANCE/DEVICE Description: STG Adhere to Safety Precautions With cues and reminders  Outcome: Progressing Goal: RH STG DECREASED RISK OF FALL WITH ASSISTANCE Description: STG Decreased Risk of Fall With cues and reminders Assistance. Outcome: Progressing   Problem: RH PAIN MANAGEMENT Goal: RH STG PAIN MANAGED AT OR BELOW PT'S PAIN GOAL Description: Pain level less than 4 on scale of 0-10 Outcome: Progressing   Problem: RH KNOWLEDGE DEFICIT GENERAL Goal: RH STG INCREASE KNOWLEDGE OF SELF CARE AFTER HOSPITALIZATION Description: Pt will be able to adhere to medication regimen and dietary restriction with min assist upon discharge.  Outcome: Progressing

## 2018-12-29 NOTE — Progress Notes (Signed)
Occupational Therapy Session Note  Patient Details  Name: Leonard [name] MRN: [id number] Date of Birth: Dec 29, 1945  Today's Date: 12/29/2018 OT Individual Time: 0700-0755 OT Individual Time Calculation (min): 55 min    Short Term Goals: Week 1:  OT Short Term Goal 1 (Week 1): Pt will sit to stand with LRAD and MOD A of 1 caregiver to decrease burden of care OT Short Term Goal 2 (Week 1): Pt will transfer to toilet wiht MOD A and LRAD OT Short Term Goal 3 (Week 1): Pt will thread BLE into pants and AE PRN  Skilled Therapeutic Interventions/Progress Updates:    Pt resting in bed upon arrival and agreeable to getting OOB and washing up/dressing.  Supine>sit EOB with supervision. Sit<>stand from EOB with supervision and min verbal cues for sequencing. Amb with RW to w/c for bathing at sink.  Pt could not recall if he had a BM yesterday when questioned but did comment that "I remember cleaning me up." Pt completed UB bathing tasks with supervision.  Pt requires assistance with bathing buttocks and states that he "couldn't reach" before. Pt requires assistance threading paper scrubs.  No clean pants available in room. Pt continues to comment that he needs to shave but wants to use items from home (not available). Sit<>stand from w/c at sink with supervision. Pt requires more than a reasonable amount of time to complete tasks with min verbal cues for task initiation and sequencing. Pt remained in w/c with all needs within reach and seat alarm activated.   Therapy Documentation Precautions:  Precautions Precautions: Fall Precaution Comments: watch BP and HR Restrictions Weight Bearing Restrictions: No Pain:  Pt denies pain this morning  Therapy/Group: Individual Therapy  [name] 12/29/2018, 8:02 AM

## 2018-12-29 NOTE — Progress Notes (Signed)
Physical Therapy Session Note  Patient Details  Name: Leonard [name] MRN: [id number] Date of Birth: 03/26/1945  Today's Date: 12/29/2018 PT Individual Time: 3846-6599 PT Individual Time Calculation (min): 40 min   Short Term Goals: Week 1:  PT Short Term Goal 1 (Week 1): Pt will increase bed moiblity to S. PT Short Term Goal 2 (Week 1): Pt will increae transfers to c/g. PT Short Term Goal 3 (Week 1): Pt will ambulate about 50 feet with rolling walker and c/g. PT Short Term Goal 4 (Week 1): Pt will ascend/descend 4 stairs with B rails and c/g. PT Short Term Goal 5 (Week 1): Pt will propel w/c about 100 feet with S.  Skilled Therapeutic Interventions/Progress Updates:   Pt received sitting in WC and agreeable to PT. PT dressed to small scraps bleeding on the LLE. WC mobility x 117f with supervision assist for equal use of BUE to prevent veer to the L. Gait training with RW x 1543fwith supervision assist for safety; mild unsteadiness noted in BUE with increased distance but no LOB. Cues from PT for improved posture and step length as tolerated. Dynamic balance training to toss ball off  Rebounded with 3 x 45sec and min assist from PT to prevent anterior LOB. Pt initially had difficulty maintaining grasp on ball, but improved with increased repetitions. Patient returned to room and [facility] in reach and all needs met.         Therapy Documentation Precautions:  Precautions Precautions: Fall Precaution Comments: watch BP and HR Restrictions Weight Bearing Restrictions: No Vital Signs: Therapy Vitals Temp: 97.7 F (36.5 C) Pulse Rate: 92 Resp: 18 BP: 104/81 Patient Position (if appropriate): Sitting Oxygen Therapy SpO2: 98 % O2 Device: Room Air Pain:   denies  Therapy/Group: Individual Therapy  AuLorie Phenix2/10/2018, 6:01 PM

## 2018-12-29 NOTE — Progress Notes (Signed)
Social Work Patient ID: Leonard [name], male   DOB: 1945/11/07, 73 y.o.   MRN: [id number]  Pt and wife aware that tx team now considering moving up d/c date to Sun 12/13 and are agreeable.  MD in agreement as well.  Planning for family ed with wife tomorrow morning and will confirm d/c readiness at that time.  Camron Essman, LCSW

## 2018-12-29 NOTE — Progress Notes (Signed)
   Creatinine bumped and K up today. After stating spiro. Will hold. Continue torsemide 20 daily.   Repeat in am.   Glori Bickers, MD  8:53 AM

## 2018-12-30 ENCOUNTER — Ambulatory Visit (HOSPITAL_COMMUNITY): Payer: Medicare Other

## 2018-12-30 ENCOUNTER — Encounter (HOSPITAL_COMMUNITY): Payer: Medicare Other

## 2018-12-30 ENCOUNTER — Inpatient Hospital Stay (HOSPITAL_COMMUNITY): Payer: Medicare Other

## 2018-12-30 LAB — BASIC METABOLIC PANEL
Anion gap: 13 (ref 5–15)
BUN: 50 mg/dL — ABNORMAL HIGH (ref 8–23)
CO2: 23 mmol/L (ref 22–32)
Calcium: 8.9 mg/dL (ref 8.9–10.3)
Chloride: 97 mmol/L — ABNORMAL LOW (ref 98–111)
Creatinine, Ser: 1.97 mg/dL — ABNORMAL HIGH (ref 0.61–1.24)
GFR calc Af Amer: 37 mL/min — ABNORMAL LOW (ref >60)
GFR calc non Af Amer: 32 mL/min — ABNORMAL LOW (ref >60)
Glucose, Bld: 109 mg/dL — ABNORMAL HIGH (ref 70–99)
Potassium: 3.4 mmol/L — ABNORMAL LOW (ref 3.5–5.1)
Sodium: 133 mmol/L — ABNORMAL LOW (ref 135–145)

## 2018-12-30 MED ORDER — VITAMIN D 50 MCG (2000 UT) PO CAPS: 1.0000 [IU] | ORAL_CAPSULE | Freq: Every day | ORAL | 1 refills | Status: DC

## 2018-12-30 MED ORDER — ACETAMINOPHEN 325 MG PO TABS: 650.0000 mg | ORAL_TABLET | ORAL | Status: AC | PRN

## 2018-12-30 MED ORDER — ATORVASTATIN CALCIUM 80 MG PO TABS: ORAL_TABLET | ORAL | 3 refills | Status: DC

## 2018-12-30 MED ORDER — FLUOXETINE HCL 10 MG PO CAPS: 10.0000 mg | ORAL_CAPSULE | Freq: Every day | ORAL | 0 refills | Status: DC

## 2018-12-30 MED ORDER — TORSEMIDE 20 MG PO TABS: 20.0000 mg | ORAL_TABLET | Freq: Every day | ORAL | 1 refills | Status: DC

## 2018-12-30 MED ORDER — AMIODARONE HCL 200 MG PO TABS: 200.0000 mg | ORAL_TABLET | Freq: Two times a day (BID) | ORAL | 1 refills | Status: DC

## 2018-12-30 MED ORDER — HYDROCERIN EX CREA: 1.0000 "application " | TOPICAL_CREAM | Freq: Two times a day (BID) | CUTANEOUS | 0 refills | Status: DC

## 2018-12-30 MED ORDER — POTASSIUM CHLORIDE CRYS ER 20 MEQ PO TBCR
40.0000 meq | EXTENDED_RELEASE_TABLET | Freq: Once | ORAL | Status: AC
  Administered 2018-12-30: 40 meq via ORAL
  Filled 2018-12-30: qty 2

## 2018-12-30 MED ORDER — TICAGRELOR 90 MG PO TABS: ORAL_TABLET | ORAL | 2 refills | Status: DC

## 2018-12-30 NOTE — Plan of Care (Signed)
  Problem: RH Bathing Goal: LTG Patient will bathe all body parts with assist levels (OT) Description: LTG: Patient will bathe all body parts with assist levels (OT) Flowsheets (Taken 12/30/2018 0745) LTG: Pt will perform bathing with assistance level/cueing: (downgraded JLS) Minimal Assistance - Patient > 75% Note: Downgraded due to progress   Problem: RH Dressing Goal: LTG Patient will perform lower body dressing w/assist (OT) Description: LTG: Patient will perform lower body dressing with assist, with/without cues in positioning using equipment (OT) Flowsheets (Taken 12/30/2018 0745) LTG: Pt will perform lower body dressing with assistance level of: (downgraded JLS) Minimal Assistance - Patient > 75% Note: downgraded due to progress   Problem: RH Toileting Goal: LTG Patient will perform toileting task (3/3 steps) with assistance level (OT) Description: LTG: Patient will perform toileting task (3/3 steps) with assistance level (OT)  Flowsheets (Taken 12/30/2018 0745) LTG: Pt will perform toileting task (3/3 steps) with assistance level: (downgraded JLS) Moderate Assistance - Patient 50 - 74% Note: Downgraded due to progress

## 2018-12-30 NOTE — Progress Notes (Signed)
Occupational Therapy Discharge Summary  Patient Details  Name: Gryffin Altice III MRN: [id number] Date of Birth: [birth year]  Patient has met 9 of 9 long term goals due to improved activity tolerance, improved balance, postural control and improved coordination.  Patient to discharge at overall Supervision-MIN A level.  Patient's care partner is independent to provide the necessary physical assistance at discharge.    Reasons goals not met: n/a  Recommendation:  Patient will benefit from ongoing skilled OT services in home health setting to continue to advance functional skills in the area of BADL.  Equipment: No equipment provided  Reasons for discharge: treatment goals met  Patient/family agrees with progress made and goals achieved: Yes  OT Discharge Vision   WNL Perception    WNL Praxis   WNL Cognition   WNL Sensation   decreased in B feet Motor    WNL Mobility    S overall for ambulatory transfers with RW Trunk/Postural Assessment     Head forward, rounded shoudlers and post pelvic tilt Decreased postural control but improved since eval Balance   S for static and dynamic sitting balanc Extremity/Trunk Assessment   generalized weakness improved in BUE since eval     [name] 12/30/2018, 2:55 PM

## 2018-12-30 NOTE — Progress Notes (Addendum)
Physical Therapy Session Note  Patient Details  Name: Leonard [name] MRN: [id number] Date of Birth: 1946/01/01  Today's Date: 12/30/2018 PT Individual Time: 1000-1100 ; 1400-1520 PT Individual Time Calculation (min): 60 min , 80 min  Short Term Goals: Week 1:  PT Short Term Goal 1 (Week 1): Pt will increase bed moiblity to S. PT Short Term Goal 2 (Week 1): Pt will increae transfers to c/g. PT Short Term Goal 3 (Week 1): Pt will ambulate about 50 feet with rolling walker and c/g. PT Short Term Goal 4 (Week 1): Pt will ascend/descend 4 stairs with B rails and c/g. PT Short Term Goal 5 (Week 1): Pt will propel w/c about 100 feet with S.  Skilled Therapeutic Interventions/Progress Updates:   tx 1:  Pt seated in w/c.  He denied pain.  Wife Leonard [name] present for family ed.  Pt's Unna boots were removed this AM.  Skin of bil LLs with multiple barely-scabbed over areas.  Wife had questions about skin care, socks, shoes, etc.  PT asked Dan, PA to discuss these issues with her and pt.  Linna Hoff will refer them to a vascular surgeon for follow -up after d/c. PT recommended that CNA or Leonard [name] monitor pt's LLs using photos.   Leonard [name] participated in, and return-demonstrated safely with pt: basic transfer, simulated car transfer to SUV height seat, guarding for ambulation on level tile and up/down 4 steps 2 rails. Pt and Leonard [name] discussed possible need for rental w/c at d/c, but decided it is not needed.   At end of session, pt in w/c with needs at hand and wife present.  tx 2:  Pt seated in w/c; he denied pain.  Pt requested help getting cleaned up.  He had had large incontinent BM, soiling shirt, pants, and w/c.  When questioned, pt stated that he had used the call bell to ask for help after this happened; however,  Nsg stated they had not been called.  He stated that he had no warning until the accident, and thought it was a "little bit".   Sit> stand from w/c with supervision to RW.  Pt transferred to bed  with supervision.  Pt needed mod assist to doff pants, and total assist to doff diaper.  He doffed his shirt with min assist.    Rolling L><R using railings for peri care by PT.  He also rolled and bridged up slightly in supine to assist with donning clean diaper and shorts.  PT provided him with hospital top, which he donned with min assist.  PT informed pt that the w/c and cushion were soiled, and he would need different ones.  Pt demonstrated poor ST memory ( over 5 min) by asking several times why PT had to get him another w/c.  He transferred to w/c as above.    At end of session, pt seated in wc with seat pad alarm set and needs at hand.     Therapy Documentation Precautions:  Precautions Precautions: Fall Precaution Comments: watch BP and HR Restrictions Weight Bearing Restrictions: No Pain: Pain Assessment Pain Scale: 0-10 Pain Score: 0-No pain       Therapy/Group: Individual Therapy  Skyy Nilan 12/30/2018, 11:06 AM

## 2018-12-30 NOTE — Progress Notes (Signed)
Unna boots removed per Linna Hoff A PA

## 2018-12-30 NOTE — Discharge Summary (Signed)
Physician Discharge Summary  Patient ID: Leonard [name] MRN: [id number] DOB/AGE: 75-Sep-1947 51 y.o.  Admit date: 12/23/2018 Discharge date: 01/01/2019  Discharge Diagnoses:  Principal Problem:   Debility Active Problems:   Acute on chronic congestive heart failure (HCC) DVT prophylaxis CKD stage [name] Mood stabilization Left bundle branch block/ischemic cardiomyopathy Hyperlipidemia Leg rash/lower extremity venous stasis Obesity Hypertension CAD/PTCA/Saint Jude ICD  Discharged Condition: Stable  Significant Diagnostic Studies: DG Chest 2 View  Result Date: 12/17/2018 CLINICAL DATA:  Weakness EXAM: CHEST - 2 VIEW COMPARISON:  Radiograph Jun 09, 2016 FINDINGS: Hazy interstitial opacities cardiomegaly with cephalized vascularity and central cuffing. No pneumothorax. No visible effusion. AICD battery pack overlies the left chest wall with leads at the cardiac apex. The aorta is calcified. The remaining cardiomediastinal contours are unremarkable. No acute osseous or soft tissue abnormality. IMPRESSION: 1. Hazy interstitial opacities and central cuffing, consistent with interstitial edema. 2. Cardiomegaly.  No pleural effusion. Electronically Signed   By: Lovena Le M.D.   On: 12/17/2018 20:54   CT HEAD WO CONTRAST  Result Date: 12/17/2018 CLINICAL DATA:  73 year old male with altered mental status. EXAM: CT HEAD WITHOUT CONTRAST TECHNIQUE: Contiguous axial images were obtained from the base of the skull through the vertex without intravenous contrast. COMPARISON:  None. FINDINGS: Brain: There is mild age-related atrophy and chronic microvascular ischemic changes. There is no acute intracranial hemorrhage. No mass effect or midline shift. No extra-axial fluid collection. Vascular: No hyperdense vessel or unexpected calcification. Skull: Normal. Negative for fracture or focal lesion. Sinuses/Orbits: Partial opacification of several ethmoid air cells. No air-fluid level. The mastoid air  cells are clear. Other: None IMPRESSION: 1. No acute intracranial hemorrhage. 2. Mild age-related atrophy and chronic microvascular ischemic changes. Electronically Signed   By: Anner Crete M.D.   On: 12/17/2018 20:43   ECHOCARDIOGRAM COMPLETE  Result Date: 12/18/2018   ECHOCARDIOGRAM REPORT   Patient Name:   Leonard [name] Date of Exam: 12/18/2018 Medical Rec #:  [id number]           Height:       73.5 in Accession #:    [id number]          Weight:       288.6 lb Date of Birth:  Jan 20, 1946            BSA:          2.53 m Patient Age:    23 years            BP:           109/85 mmHg Patient Gender: M                   HR:           95 bpm. Exam Location:  Inpatient Procedure: 2D Echo Indications:    CHF-Acute Systolic 433.29/J18.84  History:        Patient has prior history of Echocardiogram examinations, most                 recent 02/04/2017. CHF; CAD. Ischemic cardiomyopathy. CKD.  Sonographer:    Clayton Lefort RDCS (AE) Referring Phys: [id number] [facility] G BARRETT  Sonographer Comments: Patient movement. IMPRESSIONS  1. Left ventricular ejection fraction, by visual estimation, is 15-20%%. The left ventricle has normal function. Left ventricular septal wall thickness was mildly increased. Mildly increased left ventricular posterior wall thickness. There is mildly increased left ventricular hypertrophy.  2. Mildly dilated left ventricular internal cavity  size.  3. The left ventricle demonstrates global hypokinesis.  4. Global right ventricle has mildly reduced systolic function.The right ventricular size is moderately enlarged. No increase in right ventricular wall thickness.  5. Left atrial size was severely dilated.  6. Right atrial size was severely dilated.  7. Moderate pleural effusion in the left lateral region.  8. Moderate mitral annular calcification.  9. The mitral valve is normal in structure. Mild mitral valve regurgitation. No evidence of mitral stenosis. 10. The tricuspid valve is normal in  structure. Tricuspid valve regurgitation moderate. 11. The aortic valve is normal in structure. Aortic valve regurgitation is not visualized. No evidence of aortic valve sclerosis or stenosis. 12. There is Moderate sclerosis of the aortic valve. 13. There is Moderate thickening of the aortic valve. 14. The pulmonic valve was normal in structure. Pulmonic valve regurgitation is not visualized. 15. There is mild dilatation of the ascending aorta measuring 41 mm. 16. Moderately elevated pulmonary artery systolic pressure. 17. A pacer wire is visualized. 18. The inferior vena cava is dilated in size with <50% respiratory variability, suggesting right atrial pressure of 15 mmHg. FINDINGS  Left Ventricle: Left ventricular ejection fraction, by visual estimation, is 15-20%%. The left ventricle has normal function. The left ventricle demonstrates global hypokinesis. The left ventricular internal cavity size was mildly dilated left ventricle. Mildly increased left ventricular posterior wall thickness. There is mildly increased left ventricular hypertrophy. Normal left atrial pressure. Right Ventricle: The right ventricular size is moderately enlarged. No increase in right ventricular wall thickness. Global RV systolic function is has mildly reduced systolic function. The tricuspid regurgitant velocity is 2.82 m/s, and with an assumed right atrial pressure of 15 mmHg, the estimated right ventricular systolic pressure is moderately elevated at 46.8 mmHg. Left Atrium: Left atrial size was severely dilated. Right Atrium: Right atrial size was severely dilated Pericardium: There is no evidence of pericardial effusion. There is a moderate pleural effusion in the left lateral region. Mitral Valve: The mitral valve is normal in structure. Moderate mitral annular calcification. No evidence of mitral valve stenosis by observation. Mild mitral valve regurgitation. Tricuspid Valve: The tricuspid valve is normal in structure. Tricuspid  valve regurgitation moderate. Aortic Valve: The aortic valve is normal in structure.. There is Moderate thickening and Moderate sclerosis of the aortic valve. Aortic valve regurgitation is not visualized. The aortic valve is structurally normal, with no evidence of sclerosis or stenosis. There is Moderate thickening of the aortic valve. Moderate sclerosis. Aortic valve mean gradient measures 1.0 mmHg. Aortic valve peak gradient measures 2.4 mmHg. Aortic valve area, by VTI measures 2.80 cm. Pulmonic Valve: The pulmonic valve was normal in structure. Pulmonic valve regurgitation is not visualized. Aorta: The aortic root, ascending aorta and aortic arch are all structurally normal, with no evidence of dilitation or obstruction. There is mild dilatation of the ascending aorta measuring 41 mm. Venous: The inferior vena cava is dilated in size with less than 50% respiratory variability, suggesting right atrial pressure of 15 mmHg. IAS/Shunts: No atrial level shunt detected by color flow Doppler. No ventricular septal defect is seen or detected. There is no evidence of an atrial septal defect. Additional Comments: A pacer wire is visualized.  LEFT VENTRICLE PLAX 2D LVIDd:         7.10 cm LVIDs:         6.60 cm LV PW:         1.10 cm LV IVS:        1.30 cm  LVOT diam:     2.00 cm LV SV:         40 ml LV SV Index:   15.12 LVOT Area:     3.14 cm  RIGHT VENTRICLE            IVC RV Basal diam:  5.00 cm    IVC diam: 2.50 cm RV Mid diam:    4.90 cm RV S prime:     5.35 cm/s TAPSE (M-mode): 1.5 cm LEFT ATRIUM              Index       RIGHT ATRIUM           Index LA diam:        4.80 cm  1.90 cm/m  RA Area:     34.70 cm LA Vol (A2C):   118.0 ml 46.69 ml/m RA Volume:   132.00 ml 52.23 ml/m LA Vol (A4C):   129.0 ml 51.04 ml/m LA Biplane Vol: 130.0 ml 51.44 ml/m  AORTIC VALVE AV Area (Vmax):    2.62 cm AV Area (Vmean):   2.20 cm AV Area (VTI):     2.80 cm AV Vmax:           77.60 cm/s AV Vmean:          57.800 cm/s AV VTI:             0.121 m AV Peak Grad:      2.4 mmHg AV Mean Grad:      1.0 mmHg LVOT Vmax:         64.80 cm/s LVOT Vmean:        40.500 cm/s LVOT VTI:          0.108 m LVOT/AV VTI ratio: 0.89  AORTA Ao Root diam: 3.70 cm Ao Asc diam:  4.10 cm MITRAL VALVE                        TRICUSPID VALVE MV Area (PHT): 5.27 cm             TR Peak grad:   31.8 mmHg MV PHT:        41.76 msec           TR Vmax:        316.00 cm/s MV Decel Time: 144 msec MR Peak grad:    52.7 mmHg          SHUNTS MR Mean grad:    26.0 mmHg          Systemic VTI:  0.11 m MR Vmax:         363.00 cm/s        Systemic Diam: 2.00 cm MR Vmean:        229.0 cm/s MR PISA:         2.26 cm MR PISA Eff ROA: 18 mm MR PISA Radius:  0.60 cm MV E velocity: 25.20 cm/s 103 cm/s MV A velocity: 91.30 cm/s 70.3 cm/s MV E/A ratio:  0.28       1.5  Skeet Latch MD Electronically signed by Skeet Latch MD Signature Date/Time: 12/18/2018/3:57:36 PM    Final    [place] EKG SITE RITE  Result Date: 12/18/2018 If Site Rite image not attached, placement could not be confirmed due to current cardiac rhythm.   Labs:  Basic Metabolic Panel: Recent Labs  Lab 12/26/18 0340 12/27/18 0859 12/28/18 0615 12/29/18 0519 12/30/18 0640 12/31/18 0534  NA 133*  129* 131* 133* 133* 133*  K 3.6 5.9* 3.4* 4.8 3.4* 3.9  CL 96* 94* 96* 95* 97* 98  CO2 24 22 22 24 23 23   GLUCOSE 112* 133* 111* 107* 109* 109*  BUN 39* 42* 45* 49* 50* 53*  CREATININE 1.57* 1.70* 1.61* 2.02* 1.97* 1.98*  CALCIUM 8.9 8.8* 8.9 9.1 8.9 9.1    CBC: Recent Labs  Lab 12/26/18 0340  WBC 9.6  NEUTROABS 6.9  HGB 15.5  HCT 48.3  MCV 88.6  PLT 295    CBG: No results for input(s): GLUCAP in the last 168 hours.  Family history.  Mother with diabetes.  Paternal grandmother and grandfather with heart disease.  Denies any rectal cancer or colon cancer  Brief HPI:   Leonard [name] is a 73 y.o. right-handed male with history of CAD with myocardial infarction/PTCA/Saint Jude ICA, left  bundle branch block, ischemic cardiomyopathy ejection fraction 15 to 20%, CKD stage [name], lower extremity venous stasis changes as well as alcohol use.  Per chart review lives with spouse 1 level home.  Walk with a cane prior to admission.  Presented 12/17/2018 with generalized weakness and hypotension.  Chemistries upon admission to the ED showed a sodium 133, potassium 5.3, BUN 41, creatinine 1.18, SARS coronavirus negative, WBC 14,800, urinalysis negative nitrite, CK 119.  Cranial CT scan unremarkable for acute changes.  Chest x-ray hazy interstitial opacities and central cuffing consistent with interstitial edema.  No pleural effusion.  Cardiology services consulted for acute on chronic systolic congestive heart failure initially maintained on milrinone for blood pressure.  A follow-up echocardiogram ejection fraction 15 to 20%.  His digoxin and Entresto were held due to elevated creatinine with latest creatinine 1.90.  Patient currently remains on aspirin as well as Brilinta.  Noted stasis changes lower extremities versus cellulitis placed on Keflex for wound coverage.  Subcutaneous heparin for DVT prophylaxis.  Therapy evaluations completed patient was admitted for a comprehensive rehab program   Hospital Course: Juquan Reznick [name] was admitted to rehab 12/23/2018 for inpatient therapies to consist of PT, ST and OT at least three hours five days a week. Past admission physiatrist, therapy team and rehab RN have worked together to provide customized collaborative inpatient rehab.  Pertaining to patient's debility related to acute on chronic systolic congestive heart failure CAD with ischemic cardiomyopathy complicated by congestive heart failure.  He was followed by cardiology services noted ejection fraction of 15 to 20%.  Patient was off milrinone.  He remained on low-dose aspirin as well as Brilinta.  Amiodarone 200 mg twice daily cardiac rate controlled.  His digoxin and Entresto currently remains on  hold.  Patient remained on low-dose Demadex for fluid balance.  Noted history of CKD stage [name] monitoring of creatinine latest of 2.02.  Pain managed with use of Tylenol.  Mood stabilization with Prozac he was attending therapies.  He remained on Lipitor for hyperlipidemia.  Subcutaneous Lovenox throughout his rehab course for DVT prophylaxis no bleeding episodes.  Noted chronic venous changes lower extremities he completed a course of Keflex he was initially using Unna boots for edema control with good results and issues discussed with patient and wife in regards to ambulatory referral for vascular surgery to address lower extremity venous changes.  Wife had noted that edema was much improved.  He continued to use Eucerin cream the lower extremities as prior to admission.   Blood pressures were monitored on TID basis and remained a bit soft with follow-up per cardiology  services  He/ is continent of bowel and bladder.  He/ has made gains during rehab stay and is attending therapies  He/ will continue to receive follow up therapies   after discharge  Rehab course: During patient's stay in rehab weekly team conferences were held to monitor patient's progress, set goals and discuss barriers to discharge. At admission, patient required max assist ambulate 2 feet rolling walker, moderate assist supine to sit and sit to supine, max assist stand pivot transfers.  Moderate assist upper body bathing total assist lower body bathing moderate assist upper body dressing +2 physical assist lower body dressing.  Physical exam.  Blood pressure 98/74 pulse 97 temperature 98.2 respiration 23 oxygen saturation 94% room air Constitutional.  Well-developed well-nourished HEENT Head.  Normocephalic and atraumatic Eyes.  Pupils round and reactive to light no discharge without nystagmus Neck.  Supple nontender no tracheal deviation no thyromegaly Respiratory effort normal no respiratory distress GI.  Exhibits no  distention nontender positive bowel sounds Cardiac regular rate rhythm without murmur Neurological.  He is alert makes good eye contact with examiner.  Motor strength bilateral upper extremities 4 - 4+ out of 5 proximal to distal bilateral lower extremities hip flexors knee extension 4 out of 5 ankle dorsiflexion limited due to patient initially had on Unna boots.  He did have a ulcer on the dorsal left second digit.  He/  has had improvement in activity tolerance, balance, postural control as well as ability to compensate for deficits. He/ has had improvement in functional use RUE/LUE  and RLE/LLE as well as improvement in awareness.  Working with energy conservation techniques.  Propels his wheelchair with supervision.  Ambulates rolling walker 150 feet supervision mild unsteadiness.  Cues from physical therapy for improved posture.  Working with dynamic balance training to toss a ball.  Supine to sit edge of bed with supervision, sit to stand from edge of bed with supervision and minimal cues.  Completed upper body bathing task with supervision.  Requires assistance with bathing buttocks and states that he could not reach before.  Requires assistance threading paper scrubs.  Full family teaching completed plan discharge to home       Disposition: Discharge to home    Diet: Regular diet  Special Instructions: No driving smoking or alcohol  Patient was advised to monitor skin daily for any increased redness drainage or fever  Outpatient ambulatory referral made with vascular surgery to address venous changes lower extremities  Medications at discharge 1.  Tylenol as needed 2.  Amiodarone 200 mg twice daily 3.  Aspirin 81 mg p.o. daily 4.  Lipitor 80 mg p.o. daily 5.  Prozac 10 mg daily 6.  Brilinta 90 mg twice daily 7.  Demadex 20 mg p.o. daily Discharge Instructions    Ambulatory referral to Vascular Surgery   Complete by: As directed    Follow up referral for progressive venous  changes lower extremities      Follow-up Information    Lovorn, Jinny Blossom, MD Follow up.   Specialty: Physical Medicine and Rehabilitation Why: Only as needed Contact information: [address] [place] [address] [phone]        Deboraha Sprang, MD Follow up.   Specialty: Cardiology Why: Call for appointment Contact information: [address] [place] [address] [phone]        [facility] Follow up on 01/23/2019.   Specialty: Cardiology Why: Advanced Heart Failure  Clinic (Dr. Clayborne Dana office) 10:00 AM Parking Garage Code 6009  Contact information: [address] [id number] [place] [phone]          Signed: Cathlyn Parsons 12/31/2018, 3:16 PM

## 2018-12-30 NOTE — Progress Notes (Signed)
[place] PHYSICAL MEDICINE & REHABILITATION PROGRESS NOTE   Subjective/Complaints:  Pt reports unaboots off- ready to go home- likes Sunday but wants to go now.  Feels good- no complaints.   ROS- neg CP, SOB, N/V/D, HA, abd pain; vision changes Objective:   No results found. No results for input(s): WBC, HGB, HCT, PLT in the last 72 hours. Recent Labs    12/29/18 0519 12/30/18 0640  NA 133* 133*  K 4.8 3.4*  CL 95* 97*  CO2 24 23  GLUCOSE 107* 109*  BUN 49* 50*  CREATININE 2.02* 1.97*  CALCIUM 9.1 8.9    Intake/Output Summary (Last 24 hours) at 12/30/2018 1752 Last data filed at 12/30/2018 1300 Gross per 24 hour  Intake 792 ml  Output --  Net 792 ml     Physical Exam: Vital Signs Blood pressure 98/69, pulse 93, temperature 97.9 F (36.6 C), resp. rate 18, height 6' (1.829 m), weight 120.9 kg, SpO2 97 %.  General: No acute distress Sitting up in bedside chair;on phone, appropriate however pushing to go home still before Sunday, NAD Mood and affect brighter but perseverating on d/c home the entire time-  Heart: Regular rate and rhythm  Lungs: Clear to auscultation, breathing unlabored no  wheezes Abdomen: Positive bowel sounds, soft, NT, nondistended LLQ hematoma Extremities: has unaboots off his  LEs B/L- and rash on LEs has less scratching/excoriation/bleeding on entire thigh B/L has unusual rash on it that's covering entire thigh B/L Skin: dressing on 2nd L toe- otherwise, skin on toes extremely dry; unaboots on B/L LEs Neurologic:, motor strength is 5/5 in bilateral deltoid, bicep, tricep, grip,4/6  hip flexor, knee extensors, ankle dorsiflexor and plantar flexor Musculoskeletal: Full range of motion in all 4 extremities. No joint swelling    Assessment/Plan: 1. Functional deficits secondary to debility  which require 3+ hours per day of interdisciplinary therapy in a comprehensive inpatient rehab setting.  Physiatrist is providing close team supervision  and 24 hour management of active medical problems listed below.  Physiatrist and rehab team continue to assess barriers to discharge/monitor patient progress toward functional and medical goals  Care Tool:  Bathing    Body parts bathed by patient: Right arm, Left arm, Chest, Abdomen, Front perineal area, Right upper leg, Left upper leg, Right lower leg, Left lower leg, Face   Body parts bathed by helper: Buttocks Body parts n/a: Left lower leg, Face   Bathing assist Assist Level: Contact Guard/Touching assist     Upper Body Dressing/Undressing Upper body dressing   What is the patient wearing?: Pull over shirt    Upper body assist Assist Level: Set up assist    Lower Body Dressing/Undressing Lower body dressing      What is the patient wearing?: Pants     Lower body assist Assist for lower body dressing: Supervision/Verbal cueing     Toileting Toileting Toileting Activity did not occur (Clothing management and hygiene only): N/A (no void or bm)  Toileting assist Assist for toileting: Moderate Assistance - Patient 50 - 74%     Transfers Chair/bed transfer  Transfers assist     Chair/bed transfer assist level: Supervision/Verbal cueing Chair/bed transfer assistive device: Walker, Clinical biochemist   Ambulation assist      Assist level: Supervision/Verbal cueing Assistive device: Walker-rolling Max distance: 100   Walk 10 feet activity   Assist  Walk 10 feet activity did not occur: Safety/medical concerns  Assist level: Supervision/Verbal cueing Assistive device: Walker-rolling  Walk 50 feet activity   Assist Walk 50 feet with 2 turns activity did not occur: Safety/medical concerns  Assist level: Contact Guard/Touching assist Assistive device: Walker-rolling    Walk 150 feet activity   Assist Walk 150 feet activity did not occur: Safety/medical concerns  Assist level: Contact Guard/Touching assist Assistive device:  Walker-rolling    Walk 10 feet on uneven surface  activity   Assist Walk 10 feet on uneven surfaces activity did not occur: Safety/medical concerns         Wheelchair     Assist   Type of Wheelchair: Manual    Wheelchair assist level: Supervision/Verbal cueing Max wheelchair distance: 50    Wheelchair 50 feet with 2 turns activity    Assist        Assist Level: Supervision/Verbal cueing   Wheelchair 150 feet activity     Assist  Wheelchair 150 feet activity did not occur: Safety/medical concerns       Blood pressure 98/69, pulse 93, temperature 97.9 F (36.6 C), resp. rate 18, height 6' (1.829 m), weight 120.9 kg, SpO2 97 %.  Medical Problem List and Plan: 1.  Debility secondary to acute on chronic systolic congestive heart failure with history of CAD, ischemic cardiomyopathy ejection fraction 15 to 20%, off milrinone, will d/c PICC              -patient  may not shower             -ELOS/Goals: 14-17 days. Supervision/Min A  12/9- set d/c for next Wednesday 12/16  12/10- pt INSISTENT that gets out here before next Wednesday  12/11- d/c Sunday now    CIR PT, OT  2.  Antithrombotics: -DVT/anticoagulation: Subcutaneous heparin- will hold today due to hematoma , no further bleeding at heparin injection site  PT/PTT normal , per cards renal dosing for lovenox              -antiplatelet therapy: Aspirin 81 mg daily, Brilinta 90 mg twice daily 3. Pain Management: Tylenol as needed 4. Mood: Prozac 10 mg daily             -antipsychotic agents: N/A 5. Neuropsych: This patient is capable of making decisions on his own behalf. 6. Skin/Wound Care: Routine skin checks/ UNNA boots as directed 7. Fluids/Electrolytes/Nutrition: Routine in and outs. CMP ordered.  8.  CKD stage III.  Labs ordered.  12/7- Cr down to 1.57 from 1.81- high of 2.12- BUN 39  12/9- labs in AM  12/10- Cr up to 2.02 from 1.61- Heart failure physician held spironolactone due to that and  increase in K+ from 3.4 to 4.8  12/11-Cr down to 1.97 and K+ down to 3.4- will give K+ 40 mEq x1.    9.  Hypotension.  Digoxin and Entresto on hold as well as Bidil with follow-up per cardiology services.             Monitor with increased mobility.  10.  Left bundle branch block.  Patient currently on amiodarone 200 mg twice daily.  Follow-up in EP lab Dr. Caryl Comes 11.  Hyperlipidemia.  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 Leg rash and abdominal itching  12/8- will have entire team assess leg rash for cause and see what can be done for itching  14. Dispo  12/8- pt wants to go home asap- plans on hiring in-home help at home.   12/9- d/c `12/16 is planned  12/10- pt wants to go home earlier- explained he's at mod A- he doesn't seem to understand   12/11- did great- will d/c Sunday  LOS: 7 days A FACE TO FACE EVALUATION WAS PERFORMED  Johanan Skorupski 12/30/2018, 5:52 PM

## 2018-12-30 NOTE — Progress Notes (Signed)
Occupational Therapy Session Note  Patient Details  Name: Leonard [name] MRN: [id number] Date of Birth: 08/03/1945  Today's Date: 12/30/2018 OT Individual Time: 0700-0757 OT Individual Time Calculation (min): 57 min    Short Term Goals: Week 1:  OT Short Term Goal 1 (Week 1): Pt will sit to stand with LRAD and MOD A of 1 caregiver to decrease burden of care OT Short Term Goal 2 (Week 1): Pt will transfer to toilet wiht MOD A and LRAD OT Short Term Goal 3 (Week 1): Pt will thread BLE into pants and AE PRN  Skilled Therapeutic Interventions/Progress Updates:    1:1. Pt received in bed agreeable to OT and no pain this date. Pt completes bed mobility and transfers  With RW and S overall with increased time for pt to process hand placement. Pt bathes at sit to stand with A to wash buttocks and use of LHSS to wash BLE. Pt educated on use of AE for donning LB clothing (reacher, shoe funnel and sock aide). Pt able to don with S overall at sit to stand and max VC for sock aide technique. Pt would benefit from massed practice for carry over. Pt grooms seated on w/c for energy conservation. Exited session with pt seated in w/c, call light in reach nad all needs met  Therapy Documentation Precautions:  Precautions Precautions: Fall Precaution Comments: watch BP and HR Restrictions Weight Bearing Restrictions: No General:   Vital Signs: Therapy Vitals Temp: 98 F (36.7 C) Pulse Rate: 88 Resp: 18 BP: 102/77 Patient Position (if appropriate): Sitting Oxygen Therapy SpO2: 100 % O2 Device: Room Air Pain:   ADL:   Vision   Perception    Praxis   Exercises:   Other Treatments:     Therapy/Group: Individual Therapy   M  12/30/2018, 7:19 AM  

## 2018-12-30 NOTE — Progress Notes (Signed)
Social Work Discharge Note   The overall goal for the admission was met for:   Discharge location: Yes - home with wife who, along with private caregiver, can provide 24/7 assistance.  Length of Stay: Yes - 9 days (with discharge on 12/13)  Discharge activity level: Yes - minimal assistance overall  Home/community participation: Yes  Services provided included: MD, RD, PT, OT, RN, Pharmacy and SW  Financial Services: Medicare and Private Insurance: [place]  Follow-up services arranged: Home Health: RN, PT, OT via [facility], DME: walker via [employer] and Patient/Family has no preference for HH/DME agencies  Comments (or additional information):   contact info:   Wife, Shanon Rosser @ [phone]  Patient/Family verbalized understanding of follow-up arrangements: Yes  Individual responsible for coordination of the follow-up plan: pt/wife  Confirmed correct DME delivered: Lennart Pall 12/30/2018    Ritisha Deitrick

## 2018-12-30 NOTE — Progress Notes (Signed)
Occupational Therapy Session Note  Patient Details  Name: Leonard [name] MRN: [id number] Date of Birth: 21-Aug-1945  Today's Date: 12/30/2018 OT Individual Time: 1115-1200 OT Individual Time Calculation (min): 45 min    Short Term Goals: Week 1:  OT Short Term Goal 1 (Week 1): Pt will sit to stand with LRAD and MOD A of 1 caregiver to decrease burden of care OT Short Term Goal 2 (Week 1): Pt will transfer to toilet wiht MOD A and LRAD OT Short Term Goal 3 (Week 1): Pt will thread BLE into pants and AE PRN  Skilled Therapeutic Interventions/Progress Updates:    Pt resting in w/c upon arrival with wife present.  OT intervention with focus on discharge planning, education, equipment needs, and home safety recommendations. Pt sitting in w/c with puddle of urine under w/c.  Educated pt and wife on importance of using toilet whenever possible to avoid skin breakdown and UTI. Pt's wife verbalized understanding.  Pt amb with RW to [facility] in bathroom to remove pants/brief, perform hygiene, and don clean clothing. Pt requires assistance with cleaning buttocks. Pt's wife informed that pt will require assistance with this task.  Recommended regular schedule, daily weights each morning, regular activity, and timed toileting. Pt's wife verbalized understanding. Pt and wife pleased with progress and ready for discharge home on 12/13. Pt/wife had hired caregiver assisting at home.   Therapy Documentation Precautions:  Precautions Precautions: Fall Precaution Comments: watch BP and HR Restrictions Weight Bearing Restrictions: No Pain: Pain Assessment Pain Scale: 0-10 Pain Score: 0-No pain   Therapy/Group: Individual Therapy  [name] 12/30/2018, 12:25 PM

## 2018-12-30 NOTE — Plan of Care (Signed)
  Problem: Consults Goal: RH GENERAL PATIENT EDUCATION Description: See Patient Education module for education specifics. Outcome: Progressing   Problem: RH BOWEL ELIMINATION Goal: RH STG MANAGE BOWEL WITH ASSISTANCE Description: STG Manage Bowel with min Assistance. Outcome: Progressing   Problem: RH BLADDER ELIMINATION Goal: RH STG MANAGE BLADDER WITH ASSISTANCE Description: STG Manage Bladder With min Assistance Outcome: Progressing   Problem: RH SKIN INTEGRITY Goal: RH STG SKIN FREE OF INFECTION/BREAKDOWN Description: Manage bladder with min assist Outcome: Progressing   Problem: RH SAFETY Goal: RH STG ADHERE TO SAFETY PRECAUTIONS W/ASSISTANCE/DEVICE Description: STG Adhere to Safety Precautions With cues and reminders  Outcome: Progressing Goal: RH STG DECREASED RISK OF FALL WITH ASSISTANCE Description: STG Decreased Risk of Fall With cues and reminders Assistance. Outcome: Progressing   Problem: RH PAIN MANAGEMENT Goal: RH STG PAIN MANAGED AT OR BELOW PT'S PAIN GOAL Description: Pain level less than 4 on scale of 0-10 Outcome: Progressing   Problem: RH KNOWLEDGE DEFICIT GENERAL Goal: RH STG INCREASE KNOWLEDGE OF SELF CARE AFTER HOSPITALIZATION Description: Pt will be able to adhere to medication regimen and dietary restriction with min assist upon discharge.  Outcome: Progressing

## 2018-12-30 NOTE — Discharge Instructions (Signed)
Inpatient Rehab Discharge Instructions  Kimo Bancroft III Discharge date and time: No discharge date for patient encounter.   Activities/Precautions/ Functional Status: Activity: activity as tolerated Diet: Regular consistency with 1500 mL fluid restriction Wound Care: keep wound clean and dry Functional status:  ___ No restrictions     ___ Walk up steps independently ___ 24/7 supervision/assistance   ___ Walk up steps with assistance ___ Intermittent supervision/assistance  ___ Bathe/dress independently ___ Walk with walker     _x__ Bathe/dress with assistance ___ Walk Independently    ___ Shower independently ___ Walk with assistance    ___ Shower with assistance ___ No alcohol     ___ Return to work/school ________     COMMUNITY REFERRALS UPON DISCHARGE:    Home Health:   PT     OT     RN                    Agency:  [facility]   Phone: [phone]   Medical Equipment/Items Ordered:  Gilford Rile                                                      Agency/Supplier:  [place] @ [phone]      Special Instructions: No driving smoking or alcohol   My questions have been answered and I understand these instructions. I will adhere to these goals and the provided educational materials after my discharge from the hospital.  Patient/Caregiver Signature _______________________________ Date __________  Clinician Signature _______________________________________ Date __________  Please bring this form and your medication list with you to all your follow-up doctor's appointments.

## 2018-12-31 ENCOUNTER — Inpatient Hospital Stay (HOSPITAL_COMMUNITY): Payer: Medicare Other

## 2018-12-31 ENCOUNTER — Inpatient Hospital Stay (HOSPITAL_COMMUNITY): Payer: Medicare Other | Admitting: Physical Therapy

## 2018-12-31 LAB — BASIC METABOLIC PANEL WITH GFR
Anion gap: 12 (ref 5–15)
BUN: 53 mg/dL — ABNORMAL HIGH (ref 8–23)
CO2: 23 mmol/L (ref 22–32)
Calcium: 9.1 mg/dL (ref 8.9–10.3)
Chloride: 98 mmol/L (ref 98–111)
Creatinine, Ser: 1.98 mg/dL — ABNORMAL HIGH (ref 0.61–1.24)
GFR calc Af Amer: 37 mL/min — ABNORMAL LOW
GFR calc non Af Amer: 32 mL/min — ABNORMAL LOW
Glucose, Bld: 109 mg/dL — ABNORMAL HIGH (ref 70–99)
Potassium: 3.9 mmol/L (ref 3.5–5.1)
Sodium: 133 mmol/L — ABNORMAL LOW (ref 135–145)

## 2018-12-31 NOTE — Progress Notes (Signed)
[place] PHYSICAL MEDICINE & REHABILITATION PROGRESS NOTE   Subjective/Complaints:  Feeling better. Excited about discharge  ROS: Patient denies fever, rash, sore throat, blurred vision, nausea, vomiting, diarrhea, cough, shortness of breath or chest pain, joint or back pain, headache, or mood change.    Objective:   No results found. No results for input(s): WBC, HGB, HCT, PLT in the last 72 hours. Recent Labs    12/30/18 0640 12/31/18 0534  NA 133* 133*  K 3.4* 3.9  CL 97* 98  CO2 23 23  GLUCOSE 109* 109*  BUN 50* 53*  CREATININE 1.97* 1.98*  CALCIUM 8.9 9.1    Intake/Output Summary (Last 24 hours) at 12/31/2018 0950 Last data filed at 12/30/2018 2045 Gross per 24 hour  Intake 507 ml  Output --  Net 507 ml     Physical Exam: Vital Signs Blood pressure 98/77, pulse 87, temperature 97.7 F (36.5 C), resp. rate 19, height 6' (1.829 m), weight 119.9 kg, SpO2 100 %.  Constitutional: No distress . Vital signs reviewed. HEENT: EOMI, oral membranes moist Neck: supple Cardiovascular: RRR without murmur. No JVD    Respiratory: CTA Bilaterally without wheezes or rales. Normal effort    GI: BS +, non-tender, non-distended  LLQ hematoma Extremities:LE without edema, numerous healing abrasions and lacks.  Skin:  Chronic vasc changes Neurologic:, motor strength is 5/5 in bilateral deltoid, bicep, tricep, grip,4/6  hip flexor, knee extensors, ankle dorsiflexor and plantar flexor Musculoskeletal: Full range of motion in all 4 extremities. No joint swelling    Assessment/Plan: 1. Functional deficits secondary to debility  which require 3+ hours per day of interdisciplinary therapy in a comprehensive inpatient rehab setting.  Physiatrist is providing close team supervision and 24 hour management of active medical problems listed below.  Physiatrist and rehab team continue to assess barriers to discharge/monitor patient progress toward functional and medical goals  Care  Tool:  Bathing    Body parts bathed by patient: Right arm, Left arm, Chest, Abdomen, Front perineal area, Right upper leg, Left upper leg, Right lower leg, Left lower leg, Face   Body parts bathed by helper: Buttocks Body parts n/a: Left lower leg, Face   Bathing assist Assist Level: Contact Guard/Touching assist     Upper Body Dressing/Undressing Upper body dressing   What is the patient wearing?: Pull over shirt    Upper body assist Assist Level: Set up assist    Lower Body Dressing/Undressing Lower body dressing      What is the patient wearing?: Pants     Lower body assist Assist for lower body dressing: Supervision/Verbal cueing     Toileting Toileting Toileting Activity did not occur (Clothing management and hygiene only): N/A (no void or bm)  Toileting assist Assist for toileting: Moderate Assistance - Patient 50 - 74%     Transfers Chair/bed transfer  Transfers assist     Chair/bed transfer assist level: Supervision/Verbal cueing Chair/bed transfer assistive device: Walker, Clinical biochemist   Ambulation assist      Assist level: Supervision/Verbal cueing Assistive device: Walker-rolling Max distance: 100   Walk 10 feet activity   Assist  Walk 10 feet activity did not occur: Safety/medical concerns  Assist level: Supervision/Verbal cueing Assistive device: Walker-rolling   Walk 50 feet activity   Assist Walk 50 feet with 2 turns activity did not occur: Safety/medical concerns  Assist level: Contact Guard/Touching assist Assistive device: Walker-rolling    Walk 150 feet activity   Assist Walk 150  feet activity did not occur: Safety/medical concerns  Assist level: Contact Guard/Touching assist Assistive device: Walker-rolling    Walk 10 feet on uneven surface  activity   Assist Walk 10 feet on uneven surfaces activity did not occur: Safety/medical concerns         Wheelchair     Assist   Type of  Wheelchair: Manual    Wheelchair assist level: Supervision/Verbal cueing Max wheelchair distance: 50    Wheelchair 50 feet with 2 turns activity    Assist        Assist Level: Supervision/Verbal cueing   Wheelchair 150 feet activity     Assist  Wheelchair 150 feet activity did not occur: Safety/medical concerns       Blood pressure 98/77, pulse 87, temperature 97.7 F (36.5 C), resp. rate 19, height 6' (1.829 m), weight 119.9 kg, SpO2 100 %.  Medical Problem List and Plan: 1.  Debility secondary to acute on chronic systolic congestive heart failure with history of CAD, ischemic cardiomyopathy ejection fraction 15 to 20%, off milrinone, will d/c PICC              -patient  may not shower             -ELOS/Goals: 14-17 days. Supervision/Min A  12/9- set d/c for next Wednesday 12/16  12/10- pt INSISTENT that gets out here before next Wednesday  Dc 12/12     2.  Antithrombotics: -DVT/anticoagulation: Subcutaneous heparin- will hold today due to hematoma , no further bleeding at heparin injection site  PT/PTT normal , per cards renal dosing for lovenox              -antiplatelet therapy: Aspirin 81 mg daily, Brilinta 90 mg twice daily 3. Pain Management: Tylenol as needed 4. Mood: Prozac 10 mg daily             -antipsychotic agents: N/A 5. Neuropsych: This patient is capable of making decisions on his own behalf. 6. Skin/Wound Care: Routine skin checks/ UNNA boots as directed 7. Fluids/Electrolytes/Nutrition: Routine in and outs. CMP ordered.  8.  CKD stage III.  Labs ordered.  12/7- Cr down to 1.57 from 1.81- high of 2.12- BUN 39  12/9- labs in AM  12/10- Cr up to 2.02 from 1.61- Heart failure physician held spironolactone due to that and increase in K+ from 3.4 to 4.8  12/11-Cr down to 1.97 and K+ down to 3.4- will give K+ 40 mEq x1.    9.  Hypotension.  Digoxin and Entresto on hold as well as Bidil with follow-up per cardiology services.             Monitor with  increased mobility.  10.  Left bundle branch block.  Patient currently on amiodarone 200 mg twice daily.  Follow-up in EP lab Dr. Caryl Comes 11.  Hyperlipidemia.  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 Leg rash and abdominal itching  12/8- will have entire team assess leg rash for cause and see what can be done for itching  14. Dispo  12/8- pt wants to go home asap- plans on hiring in-home help at home.  12/9- d/c `12/16 is planned  12/10- pt wants to go home earlier- explained he's at mod A- he doesn't seem to understand   Dc tomorrow  LOS: 8 days A FACE TO FACE EVALUATION WAS PERFORMED  Meredith Staggers 12/31/2018, 9:50 AM

## 2018-12-31 NOTE — Plan of Care (Signed)
  Problem: Consults Goal: RH GENERAL PATIENT EDUCATION Description: See Patient Education module for education specifics. Outcome: Progressing   Problem: RH SKIN INTEGRITY Goal: RH STG SKIN FREE OF INFECTION/BREAKDOWN Description: Manage bladder with min assist Outcome: Progressing   Problem: RH SAFETY Goal: RH STG ADHERE TO SAFETY PRECAUTIONS W/ASSISTANCE/DEVICE Description: STG Adhere to Safety Precautions With cues and reminders  Outcome: Progressing Goal: RH STG DECREASED RISK OF FALL WITH ASSISTANCE Description: STG Decreased Risk of Fall With cues and reminders Assistance. Outcome: Progressing   Problem: RH PAIN MANAGEMENT Goal: RH STG PAIN MANAGED AT OR BELOW PT'S PAIN GOAL Description: Pain level less than 4 on scale of 0-10 Outcome: Progressing   Problem: RH KNOWLEDGE DEFICIT GENERAL Goal: RH STG INCREASE KNOWLEDGE OF SELF CARE AFTER HOSPITALIZATION Description: Pt will be able to adhere to medication regimen and dietary restriction with min assist upon discharge.  Outcome: Progressing

## 2018-12-31 NOTE — Progress Notes (Signed)
Physical Therapy Discharge Summary  Patient Details  Name: Leonard [name] MRN: [id number] Date of Birth: Aug 12, 1945  Today's Date: 12/31/2018 PT Individual Time: 8786-7672 PT Individual Time Calculation (min): 60 min    Patient has met 10 of 11 long term goals due to improved activity tolerance, improved balance, improved postural control, increased strength and ability to compensate for deficits.  Patient to discharge at an ambulatory level Supervision.   Patient's care partner is independent to provide the necessary physical assistance at discharge.  Reasons goals not met: Fatigue limits pt from being able to complete community level ambulation.   Recommendation:  Patient will benefit from ongoing skilled PT services in home health setting to continue to advance safe functional mobility, address ongoing impairments in balance, endurance, safety, strength, gait, transfers, stairs management, and minimize fall risk.  Equipment: RW  Reasons for discharge: treatment goals met and discharge from hospital  Patient/family agrees with progress made and goals achieved: Yes  PT treatment:  Pt received sitting in WC and agreeable to PT. Pt reports having incontinent bladder void. Sit<>stand x 2 to doff soiled brief with supervision assist. Min assist to manage clothes to don pants while standing with UE support on RW. PT instructed pt in Grad day assessment to measure progress toward goals. See below for details. WC mobility x 18f with supervision assist for symmetrical BUE use. Gait training with RW x [id number] listed below, as well as stair management with CGA and BUE support on rails x 4 steps. Car transfer with supervision assist to toAmeren Corporationnd RW. Patient returned to room and [facility] in reach and all needs met.       PT Discharge Precautions/Restrictions   fall.  Vital Signs Therapy Vitals Temp: 97.6 F (36.4 C) Pulse Rate: 87 Resp: 20 BP:  90/66 Patient Position (if appropriate): Sitting Oxygen Therapy SpO2: 100 % O2 Device: Room Air Pain   denies Vision/Perception    WFL Cognition   oriented x 4 WFL.  Sensation Sensation Light Touch: Impaired by gross assessment(decreased B feet) Coordination Gross Motor Movements are Fluid and Coordinated: Yes Fine Motor Movements are Fluid and Coordinated: Yes Motor  Motor Motor - Discharge Observations: generalized weakness B LEs. mild improvement from eval  Mobility Bed Mobility Bed Mobility: Supine to Sit;Sit to Supine Supine to Sit: Supervision/Verbal cueing Sit to Supine: Supervision/Verbal cueing Transfers Transfers: Sit to Stand;Stand to Sit;Lateral/Scoot Transfers;Stand Pivot Transfers Sit to Stand: Supervision/Verbal cueing Stand to Sit: Minimal Assistance - Patient > 75%;Supervision/Verbal cueing Stand Pivot Transfers: Supervision/Verbal cueing Transfer (Assistive device): Rolling walker Locomotion  Gait Ambulation: Yes Gait Assistance: Supervision/Verbal cueing Gait Distance (Feet): 150 Feet Assistive device: Rolling walker Gait Gait: Yes Gait Pattern: Impaired Gait Pattern: Shuffle;Poor foot clearance - right;Poor foot clearance - left Stairs / Additional Locomotion Stairs: Yes Stairs Assistance: Contact Guard/Touching assist Stair Management Technique: Two rails Number of Stairs: 4 Height of Stairs: 6 Wheelchair Mobility Wheelchair Mobility: Yes Wheelchair Assistance: SuChartered loss adjusterBoth upper extremities Wheelchair Parts Management: Needs assistance Distance: [id number]/Postural Assessment  Cervical Assessment Cervical Assessment: Exceptions to WFLHumboldt County Memorial Hospitaloracic Assessment Thoracic Assessment: Exceptions to WFLProvidence St. Peter Hospitalmbar Assessment Lumbar Assessment: Exceptions to WFLNorth Platte Surgery Center LLCstural Control Postural Control: Within Functional Limits  Balance Static Sitting Balance Static Sitting - Balance Support: No upper extremity  supported Static Sitting - Level of Assistance: 6: Modified independent (Device/Increase time) Dynamic Sitting Balance Sitting balance - Comments: supervision assist Static Standing Balance Static  Standing - Balance Support: Bilateral upper extremity supported Static Standing - Level of Assistance: 5: Stand by assistance Dynamic Standing Balance Dynamic Standing - Balance Support: Bilateral upper extremity supported Dynamic Standing - Level of Assistance: 5: Stand by assistance;4: Min assist Extremity Assessment      RLE Assessment RLE Assessment: Exceptions to [facility] Passive Range of Motion (PROM) Comments: grossly WFLs except decreased DF General Strength Comments: grossly 4-/5 LLE Assessment Passive Range of Motion (PROM) Comments: grossly WFLs except decreased DF General Strength Comments: grossly 4-/5    Lorie Phenix 12/31/2018, 4:19 PM

## 2018-12-31 NOTE — Progress Notes (Signed)
Occupational Therapy Session Note  Patient Details  Name: Leonard [name] MRN: [id number] Date of Birth: 1945-08-25  Today's Date: 12/31/2018 OT Individual Time: 1000-1100 OT Individual Time Calculation (min): 60 min    Short Term Goals: Week 1:  OT Short Term Goal 1 (Week 1): Pt will sit to stand with LRAD and MOD A of 1 caregiver to decrease burden of care OT Short Term Goal 2 (Week 1): Pt will transfer to toilet wiht MOD A and LRAD OT Short Term Goal 3 (Week 1): Pt will thread BLE into pants and AE PRN  Skilled Therapeutic Interventions/Progress Updates:    1:1. Pt received in bed agreeable to OT. Pt completes ambulatory transfer to w/c in front of sink for bathing and dressing. Pt able to wash UB and don shirt with set up. Pt completes LB washing with A for buttocks and uses LHSS to wash B feet with S. Pt dons socks with sock aide and pants with reacher. Pt able to advance pants past hips with S in standing Pt completes toilet transfer to [facility] over toilet with S using RW but unable to cleanse bottom in standing or seated for simulated hygiene. Pt reports stepping into shower using water to cleanse buttocks. Grooming completed MOD I at sink seated for energy conservation. Exited session with pt seated in w/c, exit alarm on and call light in reach  Therapy Documentation Precautions:  Precautions Precautions: Fall Precaution Comments: watch BP and HR Restrictions Weight Bearing Restrictions: No General:   Vital Signs:   Pain:   ADL:   Vision   Perception    Praxis   Exercises:   Other Treatments:     Therapy/Group: Individual Therapy  Tonny Branch 12/31/2018, 10:33 AM

## 2019-01-01 NOTE — Progress Notes (Signed)
[facility] PHYSICAL MEDICINE & REHABILITATION PROGRESS NOTE   Subjective/Complaints:  No new complaints. Ready to go home  ROS: Patient denies fever, rash, sore throat, blurred vision, nausea, vomiting, diarrhea, cough, shortness of breath or chest pain, joint or back pain, headache, or mood change. .    Objective:   No results found. No results for input(s): WBC, HGB, HCT, PLT in the last 72 hours. Recent Labs    12/30/18 0640 12/31/18 0534  NA 133* 133*  K 3.4* 3.9  CL 97* 98  CO2 23 23  GLUCOSE 109* 109*  BUN 50* 53*  CREATININE 1.97* 1.98*  CALCIUM 8.9 9.1    Intake/Output Summary (Last 24 hours) at 01/01/2019 0903 Last data filed at 01/01/2019 0816 Gross per 24 hour  Intake 497 ml  Output -  Net 497 ml     Physical Exam: Vital Signs Blood pressure 98/76, pulse 87, temperature (!) 97.5 F (36.4 C), temperature source Oral, resp. rate 18, height 6' (1.829 m), weight 120.2 kg, SpO2 98 %.  Constitutional: No distress . Vital signs reviewed. HEENT: EOMI, oral membranes moist Neck: supple Cardiovascular: RRR without murmur. No JVD    Respiratory: CTA Bilaterally without wheezes or rales. Normal effort    GI: BS +, non-tender, non-distended   LLQ hematoma Extremities:LE without edema, numerous healing abrasions and lacks.  Skin:  Chronic vasc changes without changes, abrasions Neurologic:, motor strength is 5/5 in bilateral deltoid, bicep, tricep, grip,4/6  hip flexor, knee extensors, ankle dorsiflexor and plantar flexor Musculoskeletal: Full range of motion in all 4 extremities. No joint swelling    Assessment/Plan: 1. Functional deficits secondary to debility  which require 3+ hours per day of interdisciplinary therapy in a comprehensive inpatient rehab setting.  Physiatrist is providing close team supervision and 24 hour management of active medical problems listed below.  Physiatrist and rehab team continue to assess barriers to discharge/monitor patient  progress toward functional and medical goals  Care Tool:  Bathing    Body parts bathed by patient: Right arm, Left arm, Chest, Abdomen, Front perineal area, Right upper leg, Left upper leg, Right lower leg, Left lower leg, Face   Body parts bathed by helper: Buttocks Body parts n/a: Left lower leg, Face   Bathing assist Assist Level: Contact Guard/Touching assist     Upper Body Dressing/Undressing Upper body dressing   What is the patient wearing?: Pull over shirt    Upper body assist Assist Level: Set up assist    Lower Body Dressing/Undressing Lower body dressing      What is the patient wearing?: Pants     Lower body assist Assist for lower body dressing: Supervision/Verbal cueing     Toileting Toileting Toileting Activity did not occur (Clothing management and hygiene only): N/A (no void or bm)  Toileting assist Assist for toileting: Moderate Assistance - Patient 50 - 74%     Transfers Chair/bed transfer  Transfers assist     Chair/bed transfer assist level: Supervision/Verbal cueing Chair/bed transfer assistive device: Programmer, multimedia   Ambulation assist      Assist level: Supervision/Verbal cueing Assistive device: Walker-rolling Max distance: 150   Walk 10 feet activity   Assist  Walk 10 feet activity did not occur: Safety/medical concerns  Assist level: Supervision/Verbal cueing Assistive device: Walker-rolling   Walk 50 feet activity   Assist Walk 50 feet with 2 turns activity did not occur: Safety/medical concerns  Assist level: Supervision/Verbal cueing Assistive device: Walker-rolling  Walk 150 feet activity   Assist Walk 150 feet activity did not occur: Safety/medical concerns  Assist level: Supervision/Verbal cueing Assistive device: Walker-rolling    Walk 10 feet on uneven surface  activity   Assist Walk 10 feet on uneven surfaces activity did not occur: Safety/medical concerns   Assist level:  Contact Guard/Touching assist Assistive device: Walker-rolling   Wheelchair     Assist   Type of Wheelchair: Manual    Wheelchair assist level: Supervision/Verbal cueing Max wheelchair distance: 150    Wheelchair 50 feet with 2 turns activity    Assist        Assist Level: Supervision/Verbal cueing   Wheelchair 150 feet activity     Assist  Wheelchair 150 feet activity did not occur: Safety/medical concerns   Assist Level: Supervision/Verbal cueing   Blood pressure 98/76, pulse 87, temperature (!) 97.5 F (36.4 C), temperature source Oral, resp. rate 18, height 6' (1.829 m), weight 120.2 kg, SpO2 98 %.  Medical Problem List and Plan: 1.  Debility secondary to acute on chronic systolic congestive heart failure with history of CAD, ischemic cardiomyopathy ejection fraction 15 to 20%, off milrinone, will d/c PICC              -dc home today  Patient to see Dr. Dagoberto Ligas in the office for transitional care encounter in 1-2 weeks.     2.  Antithrombotics: -DVT/anticoagulation: Subcutaneous heparin- will hold today due to hematoma , no further bleeding at heparin injection site  PT/PTT normal , per cards renal dosing for lovenox              -antiplatelet therapy: Aspirin 81 mg daily, Brilinta 90 mg twice daily 3. Pain Management: Tylenol as needed 4. Mood: Prozac 10 mg daily             -antipsychotic agents: N/A 5. Neuropsych: This patient is capable of making decisions on his own behalf. 6. Skin/Wound Care: Routine skin checks/ UNNA boots as directed 7. Fluids/Electrolytes/Nutrition: Routine in and outs. CMP ordered.  8.  CKD stage III.  Labs ordered.  12/7- Cr down to 1.57 from 1.81- high of 2.12- BUN 39  12/9- labs in AM  12/10- Cr up to 2.02 from 1.61- Heart failure physician held spironolactone due to that and increase in K+ from 3.4 to 4.8  12/11-Cr down to 1.97 and K+ down to 3.4- will give K+ 40 mEq x1.    9.  Hypotension.  Digoxin and Entresto on hold as  well as Bidil with follow-up per cardiology services.             Monitor with increased mobility.  10.  Left bundle branch block.  Patient currently on amiodarone 200 mg twice daily.  Follow-up in EP lab Dr. Caryl Comes 11.  Hyperlipidemia.  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 Leg rash and abdominal itching  12/8- will have entire team assess leg rash for cause and see what can be done for itching    -improved   LOS: 9 days A FACE TO FACE EVALUATION WAS PERFORMED  Meredith Staggers 01/01/2019, 9:03 AM

## 2019-01-01 NOTE — Progress Notes (Signed)
Pt was D/C with wife and all personal belongings. All questions were answered. Doy Hutching, LPN

## 2019-01-02 ENCOUNTER — Encounter: Payer: Self-pay | Admitting: Cardiology

## 2019-01-02 ENCOUNTER — Telehealth: Payer: Self-pay | Admitting: *Deleted

## 2019-01-02 NOTE — Telephone Encounter (Signed)
Transitional Care call-I spoke with Mr Leonard [name]    1. Are you/is patient experiencing any problems since coming home? Are there any questions regarding any aspect of care?NO 2. Are there any questions regarding medications administration/dosing? Are meds being taken as prescribed? Patient should review meds with caller to confirm "My wife has everything in a pill reminder" 3. Have there been any falls? NO 4. Has Home Health been to the house and/or have they contacted you? If not, have you tried to contact them? Can we help you contact them? Someone called from homecare. He also has someone in the home helping care for him that took care of their daughter previously. 5. Are bowels and bladder emptying properly? Are there any unexpected incontinence issues? If applicable, is patient following bowel/bladder programs? NO problems 6. Any fevers, problems with breathing, unexpected pain? No problem 7. Are there any skin problems or new areas of breakdown? No problem 8. Has the patient/family member arranged specialty MD follow up (ie cardiology/neurology/renal/surgical/etc)?  Can we help arrange? I have given appt to return to see Dr Dagoberto Ligas and his cardiologist is on the third floor, and he has appt. 9. Does the patient need any other services or support that we can help arrange? NO 10. Are caregivers following through as expected in assisting the patient?YES 11. Has the patient quit smoking, drinking alcohol, or using drugs as recommended? N/A  Appointment 01/11/19 arrive by 10:40 for 11:00 appt with Dr Dagoberto Ligas Address reviewed and told to look for packet in mail with paperwork. [place]

## 2019-01-02 NOTE — Telephone Encounter (Signed)
error 

## 2019-01-03 ENCOUNTER — Telehealth: Payer: Self-pay | Admitting: Family Medicine

## 2019-01-03 DIAGNOSIS — I959 Hypotension, unspecified: Secondary | ICD-10-CM | POA: Diagnosis not present

## 2019-01-03 DIAGNOSIS — I255 Ischemic cardiomyopathy: Secondary | ICD-10-CM | POA: Diagnosis not present

## 2019-01-03 DIAGNOSIS — I447 Left bundle-branch block, unspecified: Secondary | ICD-10-CM | POA: Diagnosis not present

## 2019-01-03 DIAGNOSIS — E785 Hyperlipidemia, unspecified: Secondary | ICD-10-CM | POA: Diagnosis not present

## 2019-01-03 DIAGNOSIS — Z6835 Body mass index (BMI) 35.0-35.9, adult: Secondary | ICD-10-CM | POA: Diagnosis not present

## 2019-01-03 DIAGNOSIS — I872 Venous insufficiency (chronic) (peripheral): Secondary | ICD-10-CM | POA: Diagnosis not present

## 2019-01-03 DIAGNOSIS — I5023 Acute on chronic systolic (congestive) heart failure: Secondary | ICD-10-CM | POA: Diagnosis not present

## 2019-01-03 DIAGNOSIS — I252 Old myocardial infarction: Secondary | ICD-10-CM | POA: Diagnosis not present

## 2019-01-03 DIAGNOSIS — E669 Obesity, unspecified: Secondary | ICD-10-CM | POA: Diagnosis not present

## 2019-01-03 DIAGNOSIS — I2510 Atherosclerotic heart disease of native coronary artery without angina pectoris: Secondary | ICD-10-CM | POA: Diagnosis not present

## 2019-01-03 DIAGNOSIS — I130 Hypertensive heart and chronic kidney disease with heart failure and stage 1 through stage 4 chronic kidney disease, or unspecified chronic kidney disease: Secondary | ICD-10-CM | POA: Diagnosis not present

## 2019-01-03 DIAGNOSIS — N183 Chronic kidney disease, stage 3 unspecified: Secondary | ICD-10-CM | POA: Diagnosis not present

## 2019-01-03 NOTE — Telephone Encounter (Signed)
Received fax in response to a continuity of care records request sent to [facility]. They have no record of pt ever being at that facility.

## 2019-01-05 ENCOUNTER — Telehealth: Payer: Self-pay | Admitting: Family Medicine

## 2019-01-05 DIAGNOSIS — I130 Hypertensive heart and chronic kidney disease with heart failure and stage 1 through stage 4 chronic kidney disease, or unspecified chronic kidney disease: Secondary | ICD-10-CM | POA: Diagnosis not present

## 2019-01-05 DIAGNOSIS — E785 Hyperlipidemia, unspecified: Secondary | ICD-10-CM | POA: Diagnosis not present

## 2019-01-05 DIAGNOSIS — N183 Chronic kidney disease, stage 3 unspecified: Secondary | ICD-10-CM | POA: Diagnosis not present

## 2019-01-05 DIAGNOSIS — I5023 Acute on chronic systolic (congestive) heart failure: Secondary | ICD-10-CM | POA: Diagnosis not present

## 2019-01-05 DIAGNOSIS — I959 Hypotension, unspecified: Secondary | ICD-10-CM | POA: Diagnosis not present

## 2019-01-05 DIAGNOSIS — I2510 Atherosclerotic heart disease of native coronary artery without angina pectoris: Secondary | ICD-10-CM | POA: Diagnosis not present

## 2019-01-05 NOTE — Telephone Encounter (Signed)
Please take care of this.  

## 2019-01-05 NOTE — Telephone Encounter (Signed)
Leonard [name] with [facility] call to request services. She is requesting skilled nursing for  1 time a week for 1 week  2 times a week for 1 week  1 time a week for 4 weeks  Please advise Leonard [name] at [phone]

## 2019-01-05 NOTE — Telephone Encounter (Signed)
Left detailed message on angela vm ok verbal orders

## 2019-01-06 ENCOUNTER — Telehealth: Payer: Self-pay

## 2019-01-06 NOTE — Telephone Encounter (Signed)
Please take care of this. Thanks  

## 2019-01-06 NOTE — Telephone Encounter (Signed)
A therapists with [facility] called stating that she needed verbal orders to continue occupational therapy on the pt. For once a week for 2 weeks, then twice a week for two weeks, once a week for two weeks again her phone number is [phone]. Please advise.

## 2019-01-06 NOTE — Telephone Encounter (Signed)
Left detailed message with Mallory about orders

## 2019-01-09 DIAGNOSIS — I130 Hypertensive heart and chronic kidney disease with heart failure and stage 1 through stage 4 chronic kidney disease, or unspecified chronic kidney disease: Secondary | ICD-10-CM | POA: Diagnosis not present

## 2019-01-09 DIAGNOSIS — I5023 Acute on chronic systolic (congestive) heart failure: Secondary | ICD-10-CM | POA: Diagnosis not present

## 2019-01-09 DIAGNOSIS — I2510 Atherosclerotic heart disease of native coronary artery without angina pectoris: Secondary | ICD-10-CM | POA: Diagnosis not present

## 2019-01-09 DIAGNOSIS — N183 Chronic kidney disease, stage 3 unspecified: Secondary | ICD-10-CM | POA: Diagnosis not present

## 2019-01-09 DIAGNOSIS — I959 Hypotension, unspecified: Secondary | ICD-10-CM | POA: Diagnosis not present

## 2019-01-09 DIAGNOSIS — E785 Hyperlipidemia, unspecified: Secondary | ICD-10-CM | POA: Diagnosis not present

## 2019-01-10 ENCOUNTER — Telehealth (HOSPITAL_COMMUNITY): Payer: Self-pay | Admitting: *Deleted

## 2019-01-10 NOTE — Telephone Encounter (Signed)
Contacted patient to update status of return to exercise in the cardiac rehab maintenance program. Patient aware that we will be temporarily suspending onsite maintenance exercise classes and would like to be contacted once we resume.  Sol Passer, MS, ACSM CEP 01/10/2019 1128

## 2019-01-11 ENCOUNTER — Other Ambulatory Visit: Payer: Self-pay

## 2019-01-11 ENCOUNTER — Encounter: Payer: Self-pay | Admitting: Physical Medicine and Rehabilitation

## 2019-01-11 ENCOUNTER — Encounter
Payer: Medicare Other | Attending: Physical Medicine and Rehabilitation | Admitting: Physical Medicine and Rehabilitation

## 2019-01-11 DIAGNOSIS — I255 Ischemic cardiomyopathy: Secondary | ICD-10-CM

## 2019-01-11 DIAGNOSIS — I5023 Acute on chronic systolic (congestive) heart failure: Secondary | ICD-10-CM | POA: Diagnosis not present

## 2019-01-11 DIAGNOSIS — I2510 Atherosclerotic heart disease of native coronary artery without angina pectoris: Secondary | ICD-10-CM | POA: Diagnosis not present

## 2019-01-11 DIAGNOSIS — I472 Ventricular tachycardia: Secondary | ICD-10-CM | POA: Diagnosis not present

## 2019-01-11 DIAGNOSIS — E785 Hyperlipidemia, unspecified: Secondary | ICD-10-CM | POA: Diagnosis not present

## 2019-01-11 DIAGNOSIS — I447 Left bundle-branch block, unspecified: Secondary | ICD-10-CM

## 2019-01-11 DIAGNOSIS — R5381 Other malaise: Secondary | ICD-10-CM

## 2019-01-11 DIAGNOSIS — N189 Chronic kidney disease, unspecified: Secondary | ICD-10-CM

## 2019-01-11 DIAGNOSIS — I10 Essential (primary) hypertension: Secondary | ICD-10-CM

## 2019-01-11 DIAGNOSIS — I959 Hypotension, unspecified: Secondary | ICD-10-CM | POA: Diagnosis not present

## 2019-01-11 DIAGNOSIS — I509 Heart failure, unspecified: Secondary | ICD-10-CM

## 2019-01-11 DIAGNOSIS — I130 Hypertensive heart and chronic kidney disease with heart failure and stage 1 through stage 4 chronic kidney disease, or unspecified chronic kidney disease: Secondary | ICD-10-CM | POA: Diagnosis not present

## 2019-01-11 DIAGNOSIS — N183 Chronic kidney disease, stage 3 unspecified: Secondary | ICD-10-CM | POA: Diagnosis not present

## 2019-01-11 NOTE — Patient Instructions (Signed)
1. Debility due to acute on chronic CHF- EF 15-20% - f/u with cardiology to get back in cardiac rehab after finishes H/H therapies.  2. Continue to work on walking further, like going ot grocery store today.  3. Call us if needs anything rehab -wise- however Cardiology usually gets pt into cardiac rehab, however we can try if pt asks.  4. F/U as needed

## 2019-01-11 NOTE — Progress Notes (Addendum)
Subjective:    Patient ID: Leonard [name], male    DOB: 11/01/1945, 73 y.o.   MRN: [id number]  HPI   Has therapy coming to house-coming ~1x/week currently- both nurse and therapist.  Functioning pretty well with RW. Walking mainly around the house- wife at work- going to grocery store- hasn't gone to store as of yet. Probably 40-75 ft at a time currently.   No pain- doing well.  Family doesn't have any concerns. Eating well; sleeping well.  Wants to get back to cardiac rehab- should be able to get back to cardiac rehab- once done with H/H.   Pain Inventory Average Pain 0 Pain Right Now 0 My pain is no pain  In the last 24 hours, has pain interfered with the following? General activity 0 Relation with others 0 Enjoyment of life 0 What TIME of day is your pain at its worst? no pain Sleep (in general) no pain  Pain is worse with: no pain Pain improves with: no pain Relief from Meds: no pain  Mobility use a walker ability to climb steps?  yes do you drive?  no Do you have any goals in this area?  yes  To drive again and to be able to do the active things he used to do.  Function retired  Neuro/Psych trouble walking depression anxiety  Prior Studies Any changes since last visit?  no  Physicians involved in your care Any changes since last visit?  no   Family History  Problem Relation Age of Onset  . Diabetes Mother   . Heart disease Paternal Grandmother   . Heart disease Paternal Grandfather    Social History   Socioeconomic History  . Marital status: Married    Spouse name: Not on file  . Number of children: Not on file  . Years of education: Not on file  . Highest education level: Not on file  Occupational History  . Not on file  Tobacco Use  . Smoking status: Never Smoker  . Smokeless tobacco: Never Used  Substance and Sexual Activity  . Alcohol use: Yes    Alcohol/week: 3.0 - 4.0 standard drinks    Types: 3 - 4 Shots of liquor per  week    Comment: 3-4 glasses of scotch 3-4 days per week  . Drug use: No  . Sexual activity: Not on file  Other Topics Concern  . Not on file  Social History Narrative   He tells me that his daughter died on Halloween April 30, 2015. She had a diving accident at the age of 36 and been quadriplegic for 25 years. She had been incredibly vigorous girl. He showed me her high school graduation picture.She was as he reported beautiful.  Her name was [name].   Social Determinants of Health   Financial Resource Strain:   . Difficulty of Paying Living Expenses: Not on file  Food Insecurity:   . Worried About Charity fundraiser in the Last Year: Not on file  . Ran Out of Food in the Last Year: Not on file  Transportation Needs:   . Lack of Transportation (Medical): Not on file  . Lack of Transportation (Non-Medical): Not on file  Physical Activity:   . Days of Exercise per Week: Not on file  . Minutes of Exercise per Session: Not on file  Stress:   . Feeling of Stress : Not on file  Social Connections:   . Frequency of Communication with Friends and Family: Not on  file  . Frequency of Social Gatherings with Friends and Family: Not on file  . Attends Religious Services: Not on file  . Active Member of Clubs or Organizations: Not on file  . Attends Archivist Meetings: Not on file  . Marital Status: Not on file   Past Surgical History:  Procedure Laterality Date  . CARDIAC CATHETERIZATION N/A 08/12/2015   Procedure: Left Heart Cath and Coronary Angiography;  Surgeon: Leonie Man, MD;  Location: [place] [facility];  Service: Cardiovascular: Inferior STEMI: 100% mid RCA, 90% distal RCA, 100% RPL. 40% ostial and proximal LAD and branch of ramus.  [name] CARDIAC CATHETERIZATION N/A 08/26/2015   Procedure: Left Heart Cath and Coronary Angiography;  Surgeon: Belva Crome, MD;  Location: [place] [facility];  Service: Cardiovascular: To evaluate sustained VT. Widely patent RCA stent and distal PTCA  site. Also patent RPL branch. LAD lesion appeared to be more consistent with 50-55% and 40%. Otherwise stable from previous cath. EF estimated 35-45%.  [name] CARDIOVERSION N/A 08/23/2015   Procedure: CARDIOVERSION;  Surgeon: Deboraha Sprang, MD;  Location: [place];  Service: Cardiovascular;  Laterality: N/A;  . CORONARY ANGIOPLASTY WITH STENT PLACEMENT  08/12/2015   Mid RCA 100% reduced to 0% with 2 overlapping Resolute DES.  3.0 x 22 mm with a 3.0 x 12 mm prox overlarp (postdilated to 3.4 mm); PTCA of dRCA 90%. (Very dificult,complex case - tortuous, [place] RCA - unable to advance longer stents.  RPL2 noted to have thromboembolic 937% occlusion - unable to reach.  . ELECTROPHYSIOLOGIC STUDY N/A 08/26/2015   Procedure: Electrophysiology Study;  Surgeon: Deboraha Sprang, MD;  Location: [place] [facility];  Service: Cardiovascular;  Laterality: N/A;  . EP IMPLANTABLE DEVICE N/A 08/26/2015   Procedure: ICD Implant;  Surgeon: Deboraha Sprang, MD;  Location: [place] [facility];  Service: Cardiovascular;  Laterality: LEFT:  St Jude ICD, serial number [id number].   [name] HEMORRHOID SURGERY    . RIGHT/LEFT HEART CATH AND CORONARY ANGIOGRAPHY N/A 06/10/2016   Procedure: Right/Left Heart Cath and Coronary Angiography;  Surgeon: Martinique, Peter M, MD;  [facility]: [facility] INVASIVE CV LAB:  Nonobstructive CAD. RCA stents PATENT.  Ost-Prox RCA, 10 %. Ost LM 30 %. Ost LAD 40 %. RI, 40 %; High PCWP  & LVEDP (23 mmHg) ; PAP 43/23/31 mmHg. CO/CI 3.89 / 1/5 (Severely Reduced)  . SKIN SURGERY    . TRANSTHORACIC ECHOCARDIOGRAM  08/13/2015; 08/23/2015   a. Mild concentric LVH. EF 45-50%. Basal-mid inferior and inferoseptal akinesis with hypokinesis of inferolateral and apical inferior wall. 1 DD. ;; b. Technically difficult study. Definity contrast administered.  Compared to a prior echo in 07/2015, the LVEF is slightly higher at 50-55% with mild inferior hypokinesis.  . TRANSTHORACIC ECHOCARDIOGRAM  06/09/2016   EF  20-25%, GR 2 DD. Severe  diffuse  hypokinesis with distinct regional wall motion abnormalities.   There is disproportionately severe hypokinesis and thinning of the apex, anterior septum and anterior wall (septal dyssynchrony). However, there is severe dyssynchrony , making it difficult to assess regional function.  PA P ~53 mmHg  . TRANSTHORACIC ECHOCARDIOGRAM  01/2017   Severely reduced EF.  GI to DD.  Moderate MR.  Mild to have a moderately elevated PAP (peak 38mmHg)   Past Medical History:  Diagnosis Date  . AICD (automatic cardioverter/defibrillator) present 08/26/2015   STJ  . Anxiety   . Atherosclerotic heart disease of native coronary artery with angina pectoris ([place]) 07/2015  a. 07/2015 Posterior STEMI/PCI: LM nl, LAd 40ost, RI 40, RCA 132m (3.0x22 Resolute Integrity DES distal, 3.0x12 Resolute Integrity DES prox), 90d (PTCA), RPDA small, nl, RPLB2 100 - too small for PTCA. -- patent in f/u cath 08/2015 & 05/2016 - patent RCA stents (~10%), RI 40%, LM ~30% ost LAD ~40%.  . Chronic combined systolic and diastolic CHF (congestive heart failure) ([place]) 07/2015   a. 07/2015 [facility]: EF 45-50%, basal-mid inf and infsept AK, inflat, apical inf, and apical septal HK, Gr1 DD, triv MR.;; b - Echo 05/2016 -> EF 20-25%, Gr 2 DD.  Severe diffuse HK disproportionately severe HK and thinning of apex, anterior septum and anterior wall-with septal dyssynchrony.  PAP ~53 mmHg.  [name] Hyperlipidemia with target low density lipoprotein (LDL) cholesterol less than 70 mg/dL   . Hypertensive heart disease   . Ischemic cardiomyopathy 07/2015 - 05/2016   a. 07/2015 Echo: EF 45-50%.; Follow-up Echo 08/23/2015: EF 50-55% with mild inferior hypokinesis --> 05/2016: EF 20-25%, diffuse HK. PAP ~53 mmHg.  . Kidney disease, chronic, stage [name] (GFR 30-59 ml/min) 08/12/2015  . Morbid obesity ([place])   . ST elevation myocardial infarction (STEMI) of inferior wall ([place]) 08/12/2015   Subacute presentation for inferior MI with post infarction angina on the following  day. EKG still shows injury current. Therefore decided to proceed with intervention of 100% mRCA thrombotic lesion.  . Sustained ventricular fibrillation ([place]) 08/22/2015   Underwent EP evaluation with final ICD implantation.   There were no vitals taken for this visit.  Opioid Risk Score:   Fall Risk Score:  `1  Depression screen PHQ 2/9  Depression screen [facility] 2/9 01/11/2019 06/14/2018 09/22/2017  Decreased Interest 1 0 0  Down, Depressed, Hopeless 1 0 0  PHQ - 2 Score 2 0 0  Some recent data might be hidden    Review of Systems  Constitutional: Negative.   HENT: Negative.   Eyes: Negative.   Respiratory: Negative.   Cardiovascular: Negative.   Gastrointestinal: Negative.   Endocrine: Negative.   Genitourinary: Negative.   Musculoskeletal: Positive for gait problem.  Skin: Negative.   Allergic/Immunologic: Negative.   Hematological: Bruises/bleeds easily.       On Brilinta  Psychiatric/Behavioral: Positive for dysphoric mood. The patient is nervous/anxious.        More situational  All other systems reviewed and are negative.      Objective:   Physical Exam  On phone appointment- voice is stronger and able ot speak in full sentences without taking a break to catch a breath      Assessment & Plan:    1. Debility due to acute on chronic CHF- EF 15-20% - f/u with cardiology to get back in cardiac rehab after finishes H/H therapies.  2. Continue to work on walking further, like going ot grocery store today.  3. Call us if needs anything rehab -wise- however Cardiology usually gets pt into cardiac rehab, however we can try if pt asks.  4. F/U as needed  I spent a total of 15 minutes on visit- more than 8 minutes discussing cardiac rehab and what it means, does, and how it works.

## 2019-01-12 DIAGNOSIS — I130 Hypertensive heart and chronic kidney disease with heart failure and stage 1 through stage 4 chronic kidney disease, or unspecified chronic kidney disease: Secondary | ICD-10-CM | POA: Diagnosis not present

## 2019-01-12 DIAGNOSIS — N183 Chronic kidney disease, stage 3 unspecified: Secondary | ICD-10-CM | POA: Diagnosis not present

## 2019-01-12 DIAGNOSIS — I2510 Atherosclerotic heart disease of native coronary artery without angina pectoris: Secondary | ICD-10-CM | POA: Diagnosis not present

## 2019-01-12 DIAGNOSIS — E785 Hyperlipidemia, unspecified: Secondary | ICD-10-CM | POA: Diagnosis not present

## 2019-01-12 DIAGNOSIS — I959 Hypotension, unspecified: Secondary | ICD-10-CM | POA: Diagnosis not present

## 2019-01-12 DIAGNOSIS — I5023 Acute on chronic systolic (congestive) heart failure: Secondary | ICD-10-CM | POA: Diagnosis not present

## 2019-01-17 DIAGNOSIS — I2510 Atherosclerotic heart disease of native coronary artery without angina pectoris: Secondary | ICD-10-CM | POA: Diagnosis not present

## 2019-01-17 DIAGNOSIS — E785 Hyperlipidemia, unspecified: Secondary | ICD-10-CM | POA: Diagnosis not present

## 2019-01-17 DIAGNOSIS — I5023 Acute on chronic systolic (congestive) heart failure: Secondary | ICD-10-CM | POA: Diagnosis not present

## 2019-01-17 DIAGNOSIS — I959 Hypotension, unspecified: Secondary | ICD-10-CM | POA: Diagnosis not present

## 2019-01-17 DIAGNOSIS — N183 Chronic kidney disease, stage 3 unspecified: Secondary | ICD-10-CM | POA: Diagnosis not present

## 2019-01-17 DIAGNOSIS — I130 Hypertensive heart and chronic kidney disease with heart failure and stage 1 through stage 4 chronic kidney disease, or unspecified chronic kidney disease: Secondary | ICD-10-CM | POA: Diagnosis not present

## 2019-01-18 DIAGNOSIS — I2510 Atherosclerotic heart disease of native coronary artery without angina pectoris: Secondary | ICD-10-CM | POA: Diagnosis not present

## 2019-01-18 DIAGNOSIS — I130 Hypertensive heart and chronic kidney disease with heart failure and stage 1 through stage 4 chronic kidney disease, or unspecified chronic kidney disease: Secondary | ICD-10-CM | POA: Diagnosis not present

## 2019-01-18 DIAGNOSIS — E785 Hyperlipidemia, unspecified: Secondary | ICD-10-CM | POA: Diagnosis not present

## 2019-01-18 DIAGNOSIS — I5023 Acute on chronic systolic (congestive) heart failure: Secondary | ICD-10-CM | POA: Diagnosis not present

## 2019-01-18 DIAGNOSIS — N183 Chronic kidney disease, stage 3 unspecified: Secondary | ICD-10-CM | POA: Diagnosis not present

## 2019-01-18 DIAGNOSIS — I959 Hypotension, unspecified: Secondary | ICD-10-CM | POA: Diagnosis not present

## 2019-01-19 ENCOUNTER — Other Ambulatory Visit: Payer: Self-pay

## 2019-01-19 ENCOUNTER — Ambulatory Visit (INDEPENDENT_AMBULATORY_CARE_PROVIDER_SITE_OTHER): Payer: Medicare Other | Admitting: Internal Medicine

## 2019-01-19 ENCOUNTER — Encounter: Payer: Self-pay | Admitting: Internal Medicine

## 2019-01-19 VITALS — BP 92/60 | HR 65 | Ht 72.0 in | Wt 267.0 lb

## 2019-01-19 DIAGNOSIS — I447 Left bundle-branch block, unspecified: Secondary | ICD-10-CM

## 2019-01-19 DIAGNOSIS — I5042 Chronic combined systolic (congestive) and diastolic (congestive) heart failure: Secondary | ICD-10-CM | POA: Diagnosis not present

## 2019-01-19 DIAGNOSIS — I959 Hypotension, unspecified: Secondary | ICD-10-CM | POA: Diagnosis not present

## 2019-01-19 DIAGNOSIS — Z95810 Presence of automatic (implantable) cardiac defibrillator: Secondary | ICD-10-CM | POA: Diagnosis not present

## 2019-01-19 DIAGNOSIS — E785 Hyperlipidemia, unspecified: Secondary | ICD-10-CM | POA: Diagnosis not present

## 2019-01-19 DIAGNOSIS — I255 Ischemic cardiomyopathy: Secondary | ICD-10-CM

## 2019-01-19 DIAGNOSIS — I5023 Acute on chronic systolic (congestive) heart failure: Secondary | ICD-10-CM | POA: Diagnosis not present

## 2019-01-19 DIAGNOSIS — I130 Hypertensive heart and chronic kidney disease with heart failure and stage 1 through stage 4 chronic kidney disease, or unspecified chronic kidney disease: Secondary | ICD-10-CM | POA: Diagnosis not present

## 2019-01-19 DIAGNOSIS — I2510 Atherosclerotic heart disease of native coronary artery without angina pectoris: Secondary | ICD-10-CM | POA: Diagnosis not present

## 2019-01-19 DIAGNOSIS — N183 Chronic kidney disease, stage 3 unspecified: Secondary | ICD-10-CM | POA: Diagnosis not present

## 2019-01-19 NOTE — Patient Instructions (Addendum)
Medication Instructions:  Your physician recommends that you continue on your current medications as directed. Please refer to the Current Medication list given to you today.  *If you need a refill on your cardiac medications before your next appointment, please call your pharmacy*  Lab Work: BMET today If you have labs (blood work) drawn today and your tests are completely normal, you will receive your results only by: [name] MyChart Message (if you have MyChart) OR . A paper copy in the mail If you have any lab test that is abnormal or we need to change your treatment, we will call you to review the results.  Testing/Procedures: None ordered.   Follow-Up: At [facility], you and your health needs are our priority.  As part of our continuing mission to provide you with exceptional heart care, we have created designated Provider Care Teams.  These Care Teams include your primary Cardiologist (physician) and Advanced Practice Providers (APPs -  Physician Assistants and Nurse Practitioners) who all work together to provide you with the care you need, when you need it.  Your next appointment:04/12/2019

## 2019-01-19 NOTE — Progress Notes (Signed)
Patient Care Team: Girtha Rm, NP-C as PCP - General (Family Medicine) Leonie Man, MD as PCP - Cardiology (Cardiology) Deboraha Sprang, MD as PCP - Electrophysiology (Cardiology) Bensimhon, Shaune Pascal, MD as PCP - Advanced Heart Failure (Cardiology)   HPI  Leonard [name] is a 73 y.o. male Seen in follow-up for ventricular tachycardia and ICD implantation for secondary prevention 8/17. He had presented in the context of ischemic heart disease prior PCI with sustained wide complex tachycardia and was cardioverted. EP testing confirmed ventricular tachycardia.  9/17   appropriate therapy for polymorphic ventricular tachycardia with some degree of long short coupling>>  beta blockers were increased.   Date Cr K Hgb Mg  9/17  0.93 4.4  1.5  5/18  1.41 4.9     12/20 1.98 3.9<<3.4 15.5    DATE TEST EF   8/17  Cath  35-40 % Widely patent right coronary stents,Ostial 50-60% LAD  5/18   Echo  20-25 %   5/18  Cath  Findings unchanged   1/19 Echo 15-20%     He has been feeling better.  He has mild shortness of breath but is quite active.  He has had no peripheral edema.   He snores.  He has daytime somnolence.  His wife is observed apneic spells   Records and Results Reviewed As above he had gone to the heart failure clinic.  He found it overly negative  He tells me that his daughter died on Halloween 2017. She had a diving accident at the age of 76 and been quadriplegic for 25 years. She had been incredibly vigorous girl. He showed me her high school graduation picture.She was as he reported beautiful.  Her name was Leonard [name]  Was hospitalized 11-12/20 for refractory heart failure.  Required milrinone and IV diuretics.  Followed by CHF clinic.  Lost about 20 of 25 pounds.  Is feeling much better.  Discussing CRT upgrade, they would like to defer until after Covid  Past Medical History:  Diagnosis Date  . AICD (automatic cardioverter/defibrillator) present 08/26/2015    STJ  . Anxiety   . Atherosclerotic heart disease of native coronary artery with angina pectoris ([place]) 07/2015   a. 07/2015 Posterior STEMI/PCI: LM nl, LAd 40ost, RI 40, RCA 156m (3.0x22 Resolute Integrity DES distal, 3.0x12 Resolute Integrity DES prox), 90d (PTCA), RPDA small, nl, RPLB2 100 - too small for PTCA. -- patent in f/u cath 08/2015 & 05/2016 - patent RCA stents (~10%), RI 40%, LM ~30% ost LAD ~40%.  . Chronic combined systolic and diastolic CHF (congestive heart failure) ([place]) 07/2015   a. 07/2015 Ehco: EF 45-50%, basal-mid inf and infsept AK, inflat, apical inf, and apical septal HK, Gr1 DD, triv MR.;; b - Echo 05/2016 -> EF 20-25%, Gr 2 DD.  Severe diffuse HK disproportionately severe HK and thinning of apex, anterior septum and anterior wall-with septal dyssynchrony.  PAP ~53 mmHg.  [name] Hyperlipidemia with target low density lipoprotein (LDL) cholesterol less than 70 mg/dL   . Hypertensive heart disease   . Ischemic cardiomyopathy 07/2015 - 05/2016   a. 07/2015 Echo: EF 45-50%.; Follow-up Echo 08/23/2015: EF 50-55% with mild inferior hypokinesis --> 05/2016: EF 20-25%, diffuse HK. PAP ~53 mmHg.  . Kidney disease, chronic, stage [name] (GFR 30-59 ml/min) 08/12/2015  . Morbid obesity ([place])   . ST elevation myocardial infarction (STEMI) of inferior wall ([place]) 08/12/2015   Subacute presentation for inferior MI with post infarction angina on  the following day. EKG still shows injury current. Therefore decided to proceed with intervention of 100% mRCA thrombotic lesion.  . Sustained ventricular fibrillation ([place]) 08/22/2015   Underwent EP evaluation with final ICD implantation.    Past Surgical History:  Procedure Laterality Date  . CARDIAC CATHETERIZATION N/A 08/12/2015   Procedure: Left Heart Cath and Coronary Angiography;  Surgeon: Leonie Man, MD;  Location: [facility];  Service: Cardiovascular: Inferior STEMI: 100% mid RCA, 90% distal RCA, 100% RPL. 40% ostial and proximal LAD and  branch of ramus.  [name] CARDIAC CATHETERIZATION N/A 08/26/2015   Procedure: Left Heart Cath and Coronary Angiography;  Surgeon: Belva Crome, MD;  Location: [facility];  Service: Cardiovascular: To evaluate sustained VT. Widely patent RCA stent and distal PTCA site. Also patent RPL branch. LAD lesion appeared to be more consistent with 50-55% and 40%. Otherwise stable from previous cath. EF estimated 35-45%.  [name] CARDIOVERSION N/A 08/23/2015   Procedure: CARDIOVERSION;  Surgeon: Deboraha Sprang, MD;  Location: [facility];  Service: Cardiovascular;  Laterality: N/A;  . CORONARY ANGIOPLASTY WITH STENT PLACEMENT  08/12/2015   Mid RCA 100% reduced to 0% with 2 overlapping Resolute DES.  3.0 x 22 mm with a 3.0 x 12 mm prox overlarp (postdilated to 3.4 mm); PTCA of dRCA 90%. (Very dificult,complex case - tortuous, [place] RCA - unable to advance longer stents.  RPL2 noted to have thromboembolic 979% occlusion - unable to reach.  . ELECTROPHYSIOLOGIC STUDY N/A 08/26/2015   Procedure: Electrophysiology Study;  Surgeon: Deboraha Sprang, MD;  Location: [facility];  Service: Cardiovascular;  Laterality: N/A;  . EP IMPLANTABLE DEVICE N/A 08/26/2015   Procedure: ICD Implant;  Surgeon: Deboraha Sprang, MD;  Location: [facility];  Service: Cardiovascular;  Laterality: LEFT:  St Jude ICD, serial number [id number].   [name] HEMORRHOID SURGERY    . RIGHT/LEFT HEART CATH AND CORONARY ANGIOGRAPHY N/A 06/10/2016   Procedure: Right/Left Heart Cath and Coronary Angiography;  Surgeon: Martinique, Peter M, MD;  Location: [facility]:  Nonobstructive CAD. RCA stents PATENT.  Ost-Prox RCA, 10 %. Ost LM 30 %. Ost LAD 40 %. RI, 40 %; High PCWP  & LVEDP (23 mmHg) ; PAP 43/23/31 mmHg. CO/CI 3.89 / 1/5 (Severely Reduced)  . SKIN SURGERY    . TRANSTHORACIC ECHOCARDIOGRAM  08/13/2015; 08/23/2015   a. Mild concentric LVH. EF 45-50%. Basal-mid inferior and inferoseptal akinesis with hypokinesis of inferolateral and apical inferior  wall. 1 DD. ;; b. Technically difficult study. Definity contrast administered.  Compared to a prior echo in 07/2015, the LVEF is slightly higher at 50-55% with mild inferior hypokinesis.  . TRANSTHORACIC ECHOCARDIOGRAM  06/09/2016   EF  20-25%, GR 2 DD. Severe diffuse  hypokinesis with distinct regional wall motion abnormalities.   There is disproportionately severe hypokinesis and thinning of the apex, anterior septum and anterior wall (septal dyssynchrony). However, there is severe dyssynchrony , making it difficult to assess regional function.  PA P ~53 mmHg  . TRANSTHORACIC ECHOCARDIOGRAM  01/2017   Severely reduced EF.  GI to DD.  Moderate MR.  Mild to have a moderately elevated PAP (peak 12mmHg)    Current Outpatient Medications  Medication Sig Dispense Refill  . acetaminophen (TYLENOL) 325 MG tablet Take 2 tablets (650 mg total) by mouth every 4 (four) hours as needed for headache or mild pain.    [name] amiodarone (PACERONE) 200 MG tablet Take 1 tablet (200  mg total) by mouth 2 (two) times daily. 60 tablet 1  . aspirin EC 81 MG tablet Take 1 tablet (81 mg total) by mouth daily.    [name] atorvastatin (LIPITOR) 80 MG tablet TAKE 1 TABLET BY MOUTH DAILY AT 6 PM; MAKE ANNUAL APPOINTMENT WITH DOCTOR Verena Shawgo FOR FUTURE REFILLS 90 tablet 3  . Cholecalciferol (VITAMIN D) 50 MCG (2000 UT) CAPS Take 0.0005 capsules (1 Units total) by mouth daily. 30 capsule 1  . Coenzyme Q10 (COQ10) 200 MG CAPS Take 200 mg by mouth daily.    [name] ENTRESTO 49-51 MG Take 1 tablet by mouth 2 (two) times daily.    [name] FLUoxetine (PROZAC) 10 MG capsule Take 1 capsule (10 mg total) by mouth daily. 30 capsule 0  . hydrocerin (EUCERIN) CREA Apply 1 application topically 2 (two) times daily. 113 g 0  . ticagrelor (BRILINTA) 90 MG TABS tablet TAKE 1 TABLET BY MOUTH TWICE DAILY(NEEDS OFFICE VISIT FOR REFILLS) 180 tablet 2  . torsemide (DEMADEX) 20 MG tablet Take 1 tablet (20 mg total) by mouth daily. 30 tablet 1   No current  facility-administered medications for this visit.    Allergies  Allergen Reactions  . Clams [Shellfish Allergy] Shortness Of Breath, Itching and Swelling  . Shellfish-Derived Products Shortness Of Breath, Itching and Swelling  . Other Itching and Other (See Comments)    Ragweed causes watery eyes, itching, sneezing  . Sulfa Antibiotics Other (See Comments)    Pt unsure of reaction, mother told him he was allergic       Review of Systems negative except from HPI and PMH  Physical Exam BP 92/60   Pulse 65   Ht 6' (1.829 m)   Wt 267 lb (121.1 kg)   BMI 36.21 kg/m  Well developed and well nourished in no acute distress HENT normal Neck supple with JVP-flat Clear Device pocket well healed; without hematoma or erythema.  There is no tethering  Regular rate and rhythm, no  murmur Abd-soft with active BS No Clubbing cyanosis tr edema Skin-warm and dry A & Oriented  Grossly normal sensory and motor function  ECG sinus at 65 Intervals 27/21/54 Atypical left bundle branch block   ECGs were personally reviewed from 12/20 11/20 & 2/19 all of which show monophasic R wave in at least one of the lateral leads  Assessment and  Plan Ischemic cardiomyopathy  Congestive heart failure-chronic-systolic  Ventricular tachycardia-monomorphic and polymorphic  Hypomagnesemia   Implantable defibrillator-St. Jude  LBBB  Sinus tachycardia   Renal insufficiency acute/chronic  Sleep disordered breathing and daytime somnolence    Patient with ischemic cardiomyopathy chronic heart failure but somewhat improved.  Now much more intentional on losing weight and getting himself better.  Would like to defer CRT upgrade through the Covid season.  We will plan to see him in March.  Suggested the use of a resting elliptical (cubii)  We will recheck his metabolic profile with his elevated creatinine following his diuresis.  We spent more than 50% of our >25 min visit in face to face  counseling regarding the above

## 2019-01-20 LAB — BASIC METABOLIC PANEL
BUN/Creatinine Ratio: 20 (ref 10–24)
BUN: 42 mg/dL — ABNORMAL HIGH (ref 8–27)
CO2: 19 mmol/L — ABNORMAL LOW (ref 20–29)
Calcium: 9.8 mg/dL (ref 8.6–10.2)
Chloride: 94 mmol/L — ABNORMAL LOW (ref 96–106)
Creatinine, Ser: 2.13 mg/dL — ABNORMAL HIGH (ref 0.76–1.27)
GFR calc Af Amer: 34 mL/min/{1.73_m2} — ABNORMAL LOW (ref >59)
GFR calc non Af Amer: 29 mL/min/{1.73_m2} — ABNORMAL LOW (ref >59)
Glucose: 81 mg/dL (ref 65–99)
Potassium: 4.7 mmol/L (ref 3.5–5.2)
Sodium: 138 mmol/L (ref 134–144)

## 2019-01-23 ENCOUNTER — Encounter (HOSPITAL_COMMUNITY): Payer: Medicare Other

## 2019-01-23 ENCOUNTER — Telehealth: Payer: Self-pay

## 2019-01-23 DIAGNOSIS — I2510 Atherosclerotic heart disease of native coronary artery without angina pectoris: Secondary | ICD-10-CM | POA: Diagnosis not present

## 2019-01-23 DIAGNOSIS — I255 Ischemic cardiomyopathy: Secondary | ICD-10-CM

## 2019-01-23 DIAGNOSIS — E785 Hyperlipidemia, unspecified: Secondary | ICD-10-CM | POA: Diagnosis not present

## 2019-01-23 DIAGNOSIS — N183 Chronic kidney disease, stage 3 unspecified: Secondary | ICD-10-CM | POA: Diagnosis not present

## 2019-01-23 DIAGNOSIS — I959 Hypotension, unspecified: Secondary | ICD-10-CM | POA: Diagnosis not present

## 2019-01-23 DIAGNOSIS — I10 Essential (primary) hypertension: Secondary | ICD-10-CM

## 2019-01-23 DIAGNOSIS — I5023 Acute on chronic systolic (congestive) heart failure: Secondary | ICD-10-CM | POA: Diagnosis not present

## 2019-01-23 DIAGNOSIS — I130 Hypertensive heart and chronic kidney disease with heart failure and stage 1 through stage 4 chronic kidney disease, or unspecified chronic kidney disease: Secondary | ICD-10-CM | POA: Diagnosis not present

## 2019-01-23 NOTE — Telephone Encounter (Signed)
Phone call to pt and advised kidney function is mildly worse and will need to keep close f/u per Dr Caryl Comes.  Recheck in 4 weeks.  Appt scheduled for 02/23/2019.  Pt may come anytime between 8am and 430pm.  Pt verbalizes understanding and agrees with plan.

## 2019-01-23 NOTE — Telephone Encounter (Signed)
-----   Message from Deboraha Sprang, MD sent at 01/20/2019  8:40 PM EST ----- fPlease Inform Patient that mild abnormality in renal function is mildly worse and will need to keep close followup-- we should plan to recheck in about 4 weeks  Thanks

## 2019-01-24 DIAGNOSIS — N183 Chronic kidney disease, stage 3 unspecified: Secondary | ICD-10-CM | POA: Diagnosis not present

## 2019-01-24 DIAGNOSIS — I2510 Atherosclerotic heart disease of native coronary artery without angina pectoris: Secondary | ICD-10-CM | POA: Diagnosis not present

## 2019-01-24 DIAGNOSIS — I5023 Acute on chronic systolic (congestive) heart failure: Secondary | ICD-10-CM | POA: Diagnosis not present

## 2019-01-24 DIAGNOSIS — E785 Hyperlipidemia, unspecified: Secondary | ICD-10-CM | POA: Diagnosis not present

## 2019-01-24 DIAGNOSIS — I130 Hypertensive heart and chronic kidney disease with heart failure and stage 1 through stage 4 chronic kidney disease, or unspecified chronic kidney disease: Secondary | ICD-10-CM | POA: Diagnosis not present

## 2019-01-24 DIAGNOSIS — I959 Hypotension, unspecified: Secondary | ICD-10-CM | POA: Diagnosis not present

## 2019-01-25 DIAGNOSIS — I5023 Acute on chronic systolic (congestive) heart failure: Secondary | ICD-10-CM | POA: Diagnosis not present

## 2019-01-25 DIAGNOSIS — I130 Hypertensive heart and chronic kidney disease with heart failure and stage 1 through stage 4 chronic kidney disease, or unspecified chronic kidney disease: Secondary | ICD-10-CM | POA: Diagnosis not present

## 2019-01-25 DIAGNOSIS — I2510 Atherosclerotic heart disease of native coronary artery without angina pectoris: Secondary | ICD-10-CM | POA: Diagnosis not present

## 2019-01-25 DIAGNOSIS — E785 Hyperlipidemia, unspecified: Secondary | ICD-10-CM | POA: Diagnosis not present

## 2019-01-25 DIAGNOSIS — I959 Hypotension, unspecified: Secondary | ICD-10-CM | POA: Diagnosis not present

## 2019-01-25 DIAGNOSIS — N183 Chronic kidney disease, stage 3 unspecified: Secondary | ICD-10-CM | POA: Diagnosis not present

## 2019-01-26 DIAGNOSIS — E785 Hyperlipidemia, unspecified: Secondary | ICD-10-CM | POA: Diagnosis not present

## 2019-01-26 DIAGNOSIS — I959 Hypotension, unspecified: Secondary | ICD-10-CM | POA: Diagnosis not present

## 2019-01-26 DIAGNOSIS — I2510 Atherosclerotic heart disease of native coronary artery without angina pectoris: Secondary | ICD-10-CM | POA: Diagnosis not present

## 2019-01-26 DIAGNOSIS — I130 Hypertensive heart and chronic kidney disease with heart failure and stage 1 through stage 4 chronic kidney disease, or unspecified chronic kidney disease: Secondary | ICD-10-CM | POA: Diagnosis not present

## 2019-01-26 DIAGNOSIS — N183 Chronic kidney disease, stage 3 unspecified: Secondary | ICD-10-CM | POA: Diagnosis not present

## 2019-01-26 DIAGNOSIS — I5023 Acute on chronic systolic (congestive) heart failure: Secondary | ICD-10-CM | POA: Diagnosis not present

## 2019-01-31 DIAGNOSIS — I2510 Atherosclerotic heart disease of native coronary artery without angina pectoris: Secondary | ICD-10-CM | POA: Diagnosis not present

## 2019-01-31 DIAGNOSIS — N183 Chronic kidney disease, stage 3 unspecified: Secondary | ICD-10-CM | POA: Diagnosis not present

## 2019-01-31 DIAGNOSIS — E785 Hyperlipidemia, unspecified: Secondary | ICD-10-CM | POA: Diagnosis not present

## 2019-01-31 DIAGNOSIS — I130 Hypertensive heart and chronic kidney disease with heart failure and stage 1 through stage 4 chronic kidney disease, or unspecified chronic kidney disease: Secondary | ICD-10-CM | POA: Diagnosis not present

## 2019-01-31 DIAGNOSIS — I5023 Acute on chronic systolic (congestive) heart failure: Secondary | ICD-10-CM | POA: Diagnosis not present

## 2019-01-31 DIAGNOSIS — I959 Hypotension, unspecified: Secondary | ICD-10-CM | POA: Diagnosis not present

## 2019-02-01 DIAGNOSIS — I2510 Atherosclerotic heart disease of native coronary artery without angina pectoris: Secondary | ICD-10-CM | POA: Diagnosis not present

## 2019-02-01 DIAGNOSIS — I5023 Acute on chronic systolic (congestive) heart failure: Secondary | ICD-10-CM | POA: Diagnosis not present

## 2019-02-01 DIAGNOSIS — N183 Chronic kidney disease, stage 3 unspecified: Secondary | ICD-10-CM | POA: Diagnosis not present

## 2019-02-01 DIAGNOSIS — I959 Hypotension, unspecified: Secondary | ICD-10-CM | POA: Diagnosis not present

## 2019-02-01 DIAGNOSIS — I130 Hypertensive heart and chronic kidney disease with heart failure and stage 1 through stage 4 chronic kidney disease, or unspecified chronic kidney disease: Secondary | ICD-10-CM | POA: Diagnosis not present

## 2019-02-01 DIAGNOSIS — E785 Hyperlipidemia, unspecified: Secondary | ICD-10-CM | POA: Diagnosis not present

## 2019-02-02 ENCOUNTER — Inpatient Hospital Stay: Payer: Medicare Other | Admitting: Family Medicine

## 2019-02-02 DIAGNOSIS — E669 Obesity, unspecified: Secondary | ICD-10-CM | POA: Diagnosis not present

## 2019-02-02 DIAGNOSIS — I2510 Atherosclerotic heart disease of native coronary artery without angina pectoris: Secondary | ICD-10-CM | POA: Diagnosis not present

## 2019-02-02 DIAGNOSIS — I255 Ischemic cardiomyopathy: Secondary | ICD-10-CM | POA: Diagnosis not present

## 2019-02-02 DIAGNOSIS — N183 Chronic kidney disease, stage 3 unspecified: Secondary | ICD-10-CM | POA: Diagnosis not present

## 2019-02-02 DIAGNOSIS — E785 Hyperlipidemia, unspecified: Secondary | ICD-10-CM | POA: Diagnosis not present

## 2019-02-02 DIAGNOSIS — I130 Hypertensive heart and chronic kidney disease with heart failure and stage 1 through stage 4 chronic kidney disease, or unspecified chronic kidney disease: Secondary | ICD-10-CM | POA: Diagnosis not present

## 2019-02-02 DIAGNOSIS — Z6835 Body mass index (BMI) 35.0-35.9, adult: Secondary | ICD-10-CM | POA: Diagnosis not present

## 2019-02-02 DIAGNOSIS — I959 Hypotension, unspecified: Secondary | ICD-10-CM | POA: Diagnosis not present

## 2019-02-02 DIAGNOSIS — I5023 Acute on chronic systolic (congestive) heart failure: Secondary | ICD-10-CM | POA: Diagnosis not present

## 2019-02-02 DIAGNOSIS — I872 Venous insufficiency (chronic) (peripheral): Secondary | ICD-10-CM | POA: Diagnosis not present

## 2019-02-02 DIAGNOSIS — I252 Old myocardial infarction: Secondary | ICD-10-CM | POA: Diagnosis not present

## 2019-02-02 DIAGNOSIS — I447 Left bundle-branch block, unspecified: Secondary | ICD-10-CM | POA: Diagnosis not present

## 2019-02-03 DIAGNOSIS — I5023 Acute on chronic systolic (congestive) heart failure: Secondary | ICD-10-CM | POA: Diagnosis not present

## 2019-02-03 DIAGNOSIS — I2510 Atherosclerotic heart disease of native coronary artery without angina pectoris: Secondary | ICD-10-CM | POA: Diagnosis not present

## 2019-02-03 DIAGNOSIS — N183 Chronic kidney disease, stage 3 unspecified: Secondary | ICD-10-CM | POA: Diagnosis not present

## 2019-02-03 DIAGNOSIS — I959 Hypotension, unspecified: Secondary | ICD-10-CM | POA: Diagnosis not present

## 2019-02-03 DIAGNOSIS — I130 Hypertensive heart and chronic kidney disease with heart failure and stage 1 through stage 4 chronic kidney disease, or unspecified chronic kidney disease: Secondary | ICD-10-CM | POA: Diagnosis not present

## 2019-02-03 DIAGNOSIS — E785 Hyperlipidemia, unspecified: Secondary | ICD-10-CM | POA: Diagnosis not present

## 2019-02-07 DIAGNOSIS — E785 Hyperlipidemia, unspecified: Secondary | ICD-10-CM | POA: Diagnosis not present

## 2019-02-07 DIAGNOSIS — N183 Chronic kidney disease, stage 3 unspecified: Secondary | ICD-10-CM | POA: Diagnosis not present

## 2019-02-07 DIAGNOSIS — I130 Hypertensive heart and chronic kidney disease with heart failure and stage 1 through stage 4 chronic kidney disease, or unspecified chronic kidney disease: Secondary | ICD-10-CM | POA: Diagnosis not present

## 2019-02-07 DIAGNOSIS — I959 Hypotension, unspecified: Secondary | ICD-10-CM | POA: Diagnosis not present

## 2019-02-07 DIAGNOSIS — I2510 Atherosclerotic heart disease of native coronary artery without angina pectoris: Secondary | ICD-10-CM | POA: Diagnosis not present

## 2019-02-07 DIAGNOSIS — I5023 Acute on chronic systolic (congestive) heart failure: Secondary | ICD-10-CM | POA: Diagnosis not present

## 2019-02-08 ENCOUNTER — Encounter: Payer: Self-pay | Admitting: Family Medicine

## 2019-02-08 ENCOUNTER — Ambulatory Visit (INDEPENDENT_AMBULATORY_CARE_PROVIDER_SITE_OTHER): Payer: Medicare Other | Admitting: Family Medicine

## 2019-02-08 ENCOUNTER — Other Ambulatory Visit: Payer: Self-pay

## 2019-02-08 VITALS — BP 102/70 | HR 60 | Wt 262.0 lb

## 2019-02-08 DIAGNOSIS — I255 Ischemic cardiomyopathy: Secondary | ICD-10-CM | POA: Diagnosis not present

## 2019-02-08 DIAGNOSIS — I130 Hypertensive heart and chronic kidney disease with heart failure and stage 1 through stage 4 chronic kidney disease, or unspecified chronic kidney disease: Secondary | ICD-10-CM | POA: Diagnosis not present

## 2019-02-08 DIAGNOSIS — F329 Major depressive disorder, single episode, unspecified: Secondary | ICD-10-CM

## 2019-02-08 DIAGNOSIS — I2510 Atherosclerotic heart disease of native coronary artery without angina pectoris: Secondary | ICD-10-CM | POA: Diagnosis not present

## 2019-02-08 DIAGNOSIS — I5042 Chronic combined systolic (congestive) and diastolic (congestive) heart failure: Secondary | ICD-10-CM

## 2019-02-08 DIAGNOSIS — Z7185 Encounter for immunization safety counseling: Secondary | ICD-10-CM

## 2019-02-08 DIAGNOSIS — F32A Depression, unspecified: Secondary | ICD-10-CM

## 2019-02-08 DIAGNOSIS — Z7189 Other specified counseling: Secondary | ICD-10-CM | POA: Diagnosis not present

## 2019-02-08 DIAGNOSIS — N183 Chronic kidney disease, stage 3 unspecified: Secondary | ICD-10-CM | POA: Diagnosis not present

## 2019-02-08 DIAGNOSIS — E785 Hyperlipidemia, unspecified: Secondary | ICD-10-CM | POA: Diagnosis not present

## 2019-02-08 DIAGNOSIS — I5023 Acute on chronic systolic (congestive) heart failure: Secondary | ICD-10-CM | POA: Diagnosis not present

## 2019-02-08 DIAGNOSIS — I959 Hypotension, unspecified: Secondary | ICD-10-CM | POA: Diagnosis not present

## 2019-02-08 NOTE — Progress Notes (Signed)
Subjective:  Documentation for virtual audio  through [facility] encounter:  The patient was located at home. 2 patient identifiers used.  The provider was located at home. The patient did consent to this visit and is aware of possible charges through their insurance for this visit.  The other persons participating in this telemedicine service were none.    Patient ID: Leonard [name], male    DOB: Dec 01, 1945, 74 y.o.   MRN: [id number]  HPI Chief Complaint  Patient presents with  . follow-up    hospital follow-up    This is a follow up on recent hospitalization. Hospitalized for CHF from 12/23/18-01/01/19.   Now followed by CHF clinic.  States he is feeling good. No new concerns.   He would like to ask my opinion about the COVID-19 vaccine and whether he is up-to-date on other immunizations.  Depression- his medication was changed from Effexor to Prozac while in the hospital. States his mood is good.   He is followed closely by cardiology for CHF, ischemic cardiomyopathy, V tachycardia with implantable defibrillator.   States he had a conversation with Dr. Caryl Comes about daytime sleepiness and his wife reporting apneic spells. He thinks Dr. Caryl Comes is planning to order a HST.   Leonie Man, MD as PCP - Cardiology (Cardiology) Deboraha Sprang, MD as PCP - Electrophysiology (Cardiology) Bensimhon, Shaune Pascal, MD as PCP - Advanced Heart Failure (Cardiology)  States he has not had any alcohol since 12/14/2018. 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 Scotch nightly.  States he has lost 30 lbs since going in the hospital.   PT, OT and home health nurse who is coming in weekly  Using a walker.  He is married. 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   Denies fever, chills, dizziness, chest pain, palpitations, shortness of breath, abdominal pain, N/V. No recent weight gain.  States he is aware that if he has fluid retention that he will call his cardiology office.   Reviewed allergies,  medications, past medical, surgical, family, and social history.    Review of Systems Pertinent positives and negatives in the history of present illness.     Objective:   Physical Exam BP 102/70   Pulse 60   Wt 262 lb (118.8 kg)   BMI 35.53 kg/m   Alert and oriented and in no acute distress.  Speaking in complete sentences without difficulty.  Normal speech, mood and thought process.      Assessment & Plan:  Depression, unspecified depression type  Morbid obesity ([place])  Chronic combined systolic and diastolic CHF (congestive heart failure) ([place])  Immunization counseling  He appears to be doing well overall. Followed closely by cardiology and CHF clinic. Currently he has PT, OT and home health nurse with weekly visits. States his mood has improved since recent hospitalization.  He is now on Prozac instead of Effexor and feels that this is working fine for him.  He is not drinking alcohol and I think this has helped with his mood as well. Reviewed immunizations and he is up-to-date.  We also discussed how he can get registered for the Covid vaccine. Encouraged healthy diet and increasing activity as tolerated and recommended by cardiology. He will follow-up when he is due for his Medicare annual wellness or sooner if needed.  Time spent on call was 24 minutes and in review of previous records 30 minutes total.  This virtual service is not related to other E/M service within previous 7  days.

## 2019-02-13 ENCOUNTER — Other Ambulatory Visit: Payer: Self-pay | Admitting: Cardiology

## 2019-02-13 ENCOUNTER — Telehealth: Payer: Self-pay | Admitting: Internal Medicine

## 2019-02-13 NOTE — Telephone Encounter (Signed)
We are recommending the COVID-19 vaccine to all of our patients. Cardiac medications (including blood thinners) should not deter anyone from being vaccinated and there is no need to hold any of those medications prior to vaccine administration.     Currently, there is a hotline to call (active 01/27/19) to schedule vaccination appointments as no walk-ins will be accepted.   Number: [phone].    If an appointment is not available please go to [email or web address] to sign up for notification when additional vaccine appointments are available.   If you have further questions or concerns about the vaccine process, please visit [email or web address] or contact your primary care physician.  Verbalized understanding

## 2019-02-17 DIAGNOSIS — I5023 Acute on chronic systolic (congestive) heart failure: Secondary | ICD-10-CM | POA: Diagnosis not present

## 2019-02-17 DIAGNOSIS — I130 Hypertensive heart and chronic kidney disease with heart failure and stage 1 through stage 4 chronic kidney disease, or unspecified chronic kidney disease: Secondary | ICD-10-CM | POA: Diagnosis not present

## 2019-02-17 DIAGNOSIS — E785 Hyperlipidemia, unspecified: Secondary | ICD-10-CM | POA: Diagnosis not present

## 2019-02-17 DIAGNOSIS — I959 Hypotension, unspecified: Secondary | ICD-10-CM | POA: Diagnosis not present

## 2019-02-17 DIAGNOSIS — Z6835 Body mass index (BMI) 35.0-35.9, adult: Secondary | ICD-10-CM | POA: Diagnosis not present

## 2019-02-17 DIAGNOSIS — N183 Chronic kidney disease, stage 3 unspecified: Secondary | ICD-10-CM | POA: Diagnosis not present

## 2019-02-17 DIAGNOSIS — E669 Obesity, unspecified: Secondary | ICD-10-CM | POA: Diagnosis not present

## 2019-02-17 DIAGNOSIS — I447 Left bundle-branch block, unspecified: Secondary | ICD-10-CM | POA: Diagnosis not present

## 2019-02-17 DIAGNOSIS — I872 Venous insufficiency (chronic) (peripheral): Secondary | ICD-10-CM | POA: Diagnosis not present

## 2019-02-17 DIAGNOSIS — I252 Old myocardial infarction: Secondary | ICD-10-CM | POA: Diagnosis not present

## 2019-02-17 DIAGNOSIS — I255 Ischemic cardiomyopathy: Secondary | ICD-10-CM | POA: Diagnosis not present

## 2019-02-17 DIAGNOSIS — I2510 Atherosclerotic heart disease of native coronary artery without angina pectoris: Secondary | ICD-10-CM | POA: Diagnosis not present

## 2019-02-23 ENCOUNTER — Other Ambulatory Visit: Payer: Medicare Other | Admitting: *Deleted

## 2019-02-23 ENCOUNTER — Other Ambulatory Visit: Payer: Self-pay | Admitting: Internal Medicine

## 2019-02-23 ENCOUNTER — Other Ambulatory Visit: Payer: Self-pay

## 2019-02-23 DIAGNOSIS — I255 Ischemic cardiomyopathy: Secondary | ICD-10-CM | POA: Diagnosis not present

## 2019-02-23 DIAGNOSIS — I10 Essential (primary) hypertension: Secondary | ICD-10-CM

## 2019-02-23 LAB — BASIC METABOLIC PANEL
BUN/Creatinine Ratio: 25 — ABNORMAL HIGH (ref 10–24)
BUN: 54 mg/dL — ABNORMAL HIGH (ref 8–27)
CO2: 22 mmol/L (ref 20–29)
Calcium: 9.4 mg/dL (ref 8.6–10.2)
Chloride: 96 mmol/L (ref 96–106)
Creatinine, Ser: 2.13 mg/dL — ABNORMAL HIGH (ref 0.76–1.27)
GFR calc Af Amer: 34 mL/min/{1.73_m2} — ABNORMAL LOW (ref >59)
GFR calc non Af Amer: 29 mL/min/{1.73_m2} — ABNORMAL LOW (ref >59)
Glucose: 97 mg/dL (ref 65–99)
Potassium: 4.7 mmol/L (ref 3.5–5.2)
Sodium: 136 mmol/L (ref 134–144)

## 2019-02-23 MED ORDER — AMIODARONE HCL 200 MG PO TABS: 200.0000 mg | ORAL_TABLET | Freq: Two times a day (BID) | ORAL | 3 refills | Status: DC

## 2019-02-23 NOTE — Telephone Encounter (Signed)
Pt's medication was sent to pt's pharmacy as requested. Confirmation received.  °

## 2019-03-05 NOTE — Progress Notes (Signed)
Cardiology Office Note   Date:  03/06/2019   ID:  [name], DOB 1945-06-16, MRN [id number]  PCP:  Girtha Rm, NP-C  Cardiologist: Dr. Ellyn Hack Electrophysiologist: Dr. Caryl Comes Advanced Heart Failure: Dr. Haroldine Laws  CC:  Follow Up   History of Present Illness: Leonard [name] is a 74 y.o. male who presents for ongoing assessment and management of ischemic heart disease, history of CAD with PCI, to the right coronary artery, PTCA to RPDA.  Repeat cardiac catheterization revealed patent RCA stents on 05/2016., sustained wide-complex tachycardia, with EP testing confirming ventricular tachycardia, status post [place] ICD.[name]  Echocardiogram in 2018 revealing EF of 20% to 25% with grade 2 diastolic dysfunction with severe diffuse hypokinesis disproportionately severe hypokinesis and thinning of the apex anterior septum anterior wall and septal dyssynchrony with EPAP of 53 mmHg.  He was hospitalized on 02/23/2018 for acute on chronic systolic heart failure with biventricular failure.  He was diuresed and placed on p.o. torsemide, he was taken off of digoxin and Entresto due to elevated creatinine along with hypotension.  He was not placed on beta-blocker due to low output.  He was also treated for lower extremity cellulitis with Keflex.  He was to limit free water in the setting of hyponatremia.Discharge weight 265.9 (120.9 kg)  Other history includes hyperlipidemia, hypertensive heart disease, morbid obesity, chronic kidney disease stage [name], OSA.  On last office visit with Dr. Caryl Comes on 01/19/2019 he wanted to defer to have his CRT upgrade after COVID pandemic restrictions were lessened. and was planned to have follow-up in March 2021.[name]  He was intentionally losing weight with diet and exercise through physical therapy and Occupational Therapy. Weight 267 lbs.   Leonard [name] comes today looking frail, but he is without any complaints with the exception of lack of energy.  He is nervous  that he will be placed back into the hospital.  His weight has been well maintained, his wife is making sure that he is medically compliant.  He has been very depressed over the last few months.  His wife states that he was placed on Prozac after being discharged from the hospital but they were concerned about him taking it.  He would also like to return to cardiac rehab in order to feel better and regain his stamina.   Past Medical History:  Diagnosis Date  . AICD (automatic cardioverter/defibrillator) present 08/26/2015   STJ  . Anxiety   . Atherosclerotic heart disease of native coronary artery with angina pectoris ([place]) 07/2015   a. 07/2015 Posterior STEMI/PCI: LM nl, LAd 40ost, RI 40, RCA 151m (3.0x22 Resolute Integrity DES distal, 3.0x12 Resolute Integrity DES prox), 90d (PTCA), RPDA small, nl, RPLB2 100 - too small for PTCA. -- patent in f/u cath 08/2015 & 05/2016 - patent RCA stents (~10%), RI 40%, LM ~30% ost LAD ~40%.  . Chronic combined systolic and diastolic CHF (congestive heart failure) ([place]) 07/2015   a. 07/2015 Ehco: EF 45-50%, basal-mid inf and infsept AK, inflat, apical inf, and apical septal HK, Gr1 DD, triv MR.;; b - Echo 05/2016 -> EF 20-25%, Gr 2 DD.  Severe diffuse HK disproportionately severe HK and thinning of apex, anterior septum and anterior wall-with septal dyssynchrony.  PAP ~53 mmHg.  [name] Hyperlipidemia with target low density lipoprotein (LDL) cholesterol less than 70 mg/dL   . Hypertensive heart disease   . Ischemic cardiomyopathy 07/2015 - 05/2016   a. 07/2015 Echo: EF 45-50%.; Follow-up Echo 08/23/2015: EF 50-55% with mild inferior  hypokinesis --> 05/2016: EF 20-25%, diffuse HK. PAP ~53 mmHg.  . Kidney disease, chronic, stage [name] (GFR 30-59 ml/min) 08/12/2015  . Morbid obesity ([place])   . ST elevation myocardial infarction (STEMI) of inferior wall ([place]) 08/12/2015   Subacute presentation for inferior MI with post infarction angina on the following day. EKG still shows injury  current. Therefore decided to proceed with intervention of 100% mRCA thrombotic lesion.  . Sustained ventricular fibrillation ([place]) 08/22/2015   Underwent EP evaluation with final ICD implantation.    Past Surgical History:  Procedure Laterality Date  . CARDIAC CATHETERIZATION N/A 08/12/2015   Procedure: Left Heart Cath and Coronary Angiography;  Surgeon: Leonie Man, MD;  Location: [facility];  Service: Cardiovascular: Inferior STEMI: 100% mid RCA, 90% distal RCA, 100% RPL. 40% ostial and proximal LAD and branch of ramus.  [name] CARDIAC CATHETERIZATION N/A 08/26/2015   Procedure: Left Heart Cath and Coronary Angiography;  Surgeon: Belva Crome, MD;  Location: [facility];  Service: Cardiovascular: To evaluate sustained VT. Widely patent RCA stent and distal PTCA site. Also patent RPL branch. LAD lesion appeared to be more consistent with 50-55% and 40%. Otherwise stable from previous cath. EF estimated 35-45%.  [name] CARDIOVERSION N/A 08/23/2015   Procedure: CARDIOVERSION;  Surgeon: Deboraha Sprang, MD;  Location: [facility];  Service: Cardiovascular;  Laterality: N/A;  . CORONARY ANGIOPLASTY WITH STENT PLACEMENT  08/12/2015   Mid RCA 100% reduced to 0% with 2 overlapping Resolute DES.  3.0 x 22 mm with a 3.0 x 12 mm prox overlarp (postdilated to 3.4 mm); PTCA of dRCA 90%. (Very dificult,complex case - tortuous, [place] RCA - unable to advance longer stents.  RPL2 noted to have thromboembolic 086% occlusion - unable to reach.  . ELECTROPHYSIOLOGIC STUDY N/A 08/26/2015   Procedure: Electrophysiology Study;  Surgeon: Deboraha Sprang, MD;  Location: [facility];  Service: Cardiovascular;  Laterality: N/A;  . EP IMPLANTABLE DEVICE N/A 08/26/2015   Procedure: ICD Implant;  Surgeon: Deboraha Sprang, MD;  Location: [facility];  Service: Cardiovascular;  Laterality: LEFT:  St Jude ICD, serial number [id number].   [name] HEMORRHOID SURGERY    . RIGHT/LEFT HEART CATH AND CORONARY ANGIOGRAPHY  N/A 06/10/2016   Procedure: Right/Left Heart Cath and Coronary Angiography;  Surgeon: Martinique, Peter M, MD;  Location: [facility] INVASIVE [facility]:  Nonobstructive CAD. RCA stents PATENT.  Ost-Prox RCA, 10 %. Ost LM 30 %. Ost LAD 40 %. RI, 40 %; High PCWP  & LVEDP (23 mmHg) ; PAP 43/23/31 mmHg. CO/CI 3.89 / 1/5 (Severely Reduced)  . SKIN SURGERY    . TRANSTHORACIC ECHOCARDIOGRAM  08/13/2015; 08/23/2015   a. Mild concentric LVH. EF 45-50%. Basal-mid inferior and inferoseptal akinesis with hypokinesis of inferolateral and apical inferior wall. 1 DD. ;; b. Technically difficult study. Definity contrast administered.  Compared to a prior echo in 07/2015, the LVEF is slightly higher at 50-55% with mild inferior hypokinesis.  . TRANSTHORACIC ECHOCARDIOGRAM  06/09/2016   EF  20-25%, GR 2 DD. Severe diffuse  hypokinesis with distinct regional wall motion abnormalities.   There is disproportionately severe hypokinesis and thinning of the apex, anterior septum and anterior wall (septal dyssynchrony). However, there is severe dyssynchrony , making it difficult to assess regional function.  PA P ~53 mmHg  . TRANSTHORACIC ECHOCARDIOGRAM  01/2017   Severely reduced EF.  GI to DD.  Moderate MR.  Mild to have a moderately elevated PAP (peak  97mmHg)     Current Outpatient Medications  Medication Sig Dispense Refill  . acetaminophen (TYLENOL) 325 MG tablet Take 2 tablets (650 mg total) by mouth every 4 (four) hours as needed for headache or mild pain.    [name] amiodarone (PACERONE) 200 MG tablet Take 1 tablet (200 mg total) by mouth 2 (two) times daily. 180 tablet 3  . aspirin EC 81 MG tablet Take 1 tablet (81 mg total) by mouth daily.    [name] atorvastatin (LIPITOR) 80 MG tablet TAKE 1 TABLET BY MOUTH DAILY AT 6 PM; MAKE ANNUAL APPOINTMENT WITH DOCTOR KLEIN FOR FUTURE REFILLS 90 tablet 3  . Cholecalciferol (VITAMIN D) 50 MCG (2000 UT) CAPS Take 0.0005 capsules (1 Units total) by mouth daily. 30 capsule 1  . Coenzyme Q10 (COQ10) 200 MG  CAPS Take 200 mg by mouth daily.    [name] ENTRESTO 49-51 MG Take 1 tablet by mouth 2 (two) times daily.    [name] FLUoxetine (PROZAC) 10 MG capsule Take 1 capsule (10 mg total) by mouth daily. 30 capsule 0  . hydrocerin (EUCERIN) CREA Apply 1 application topically 2 (two) times daily. 113 g 0  . ticagrelor (BRILINTA) 90 MG TABS tablet TAKE 1 TABLET BY MOUTH TWICE DAILY(NEEDS OFFICE VISIT FOR REFILLS) 180 tablet 2  . torsemide (DEMADEX) 20 MG tablet Take 1 tablet (20 mg total) by mouth daily. (Patient taking differently: Take 20 mg by mouth daily. Take two tablets daily.) 30 tablet 1  . MAGNESIUM-OXIDE 400 (241.3 Mg) MG tablet Take 1 tablet by mouth 2 (two) times daily.    . metoprolol succinate (TOPROL-XL) 50 MG 24 hr tablet Take 50 mg by mouth daily.    [name] spironolactone (ALDACTONE) 25 MG tablet Take 25 mg by mouth daily.    [name] venlafaxine (EFFEXOR) 75 MG tablet Take 75 mg by mouth daily.     No current facility-administered medications for this visit.    Allergies:   Clams [shellfish allergy], Shellfish-derived products, Other, and Sulfa antibiotics    Social History:  The patient  reports that he has never smoked. He has never used smokeless tobacco. He reports current alcohol use of about 3.0 - 4.0 standard drinks of alcohol per week. He reports that he does not use drugs.   Family History:  The patient's family history includes Diabetes in his mother; Heart disease in his paternal grandfather and paternal grandmother.    ROS: All other systems are reviewed and negative. Unless otherwise mentioned in H&P    PHYSICAL EXAM: VS:  BP 90/60   Pulse 96   Ht 6' (1.829 m)   Wt 271 lb 3.2 oz (123 kg)   SpO2 99%   BMI 36.78 kg/m  , BMI Body mass index is 36.78 kg/m. GEN: Well nourished, well developed, in no acute distress, frail using a walker for ambulation HEENT: normal Neck: no JVD, carotid bruits, or masses Cardiac: RRR, distant heart sounds; no murmurs, rubs, or gallops,no edema    Respiratory:  Clear to auscultation bilaterally, normal work of breathing GI: soft, nontender, nondistended, + BS MS: no deformity or atrophy Skin: warm and dry, no rash.  Pale  neuro:  Strength and sensation are intact Psych: euthymic mood, full affect   EKG:    Recent Labs: 12/18/2018: B Natriuretic Peptide 2,328.4; Magnesium 2.3; TSH 9.093 12/26/2018: ALT 19; Hemoglobin 15.5; Platelets 295 02/23/2019: BUN 54; Creatinine, Ser 2.13; Potassium 4.7; Sodium 136    Lipid Panel    Component Value Date/Time  CHOL 170 09/22/2017 0923   TRIG 229 (H) 09/22/2017 0923   HDL 44 09/22/2017 0923   CHOLHDL 3.9 09/22/2017 0923   CHOLHDL 3.9 06/10/2016 0409   VLDL 32 06/10/2016 0409   LDLCALC 80 09/22/2017 0923      Wt Readings from Last 3 Encounters:  03/06/19 271 lb 3.2 oz (123 kg)  02/08/19 262 lb (118.8 kg)  01/19/19 267 lb (121.1 kg)      Other studies Reviewed: Right and Left Cardiac Cath 06/10/2016  [place] lesion, 40 %stenosed.  Ost RCA to Prox RCA lesion, 10 %stenosed.  Mid RCA lesion, 0 %stenosed.  Dist RCA lesion, 10 %stenosed.  Ost LM to LM lesion, 30 %stenosed.  Ost LAD lesion, 40 %stenosed.  LV end diastolic pressure is moderately elevated.  Hemodynamic findings consistent with mild pulmonary hypertension.   1. Nonobstructive CAD. Continued patency of stents in the RCA 2. Elevated LVEDP 3. Mild pulmonary HTN. 4. Reduced cardiac output. 3.89 L/min with index of 1.5.  Echocardiogram 07/10/2016 Left ventricle: The cavity size was mildly dilated. Wall  thickness was increased in a pattern of mild LVH. Left  ventricular geometry showed evidence of eccentric hypertrophy.  Systolic function was severely reduced. The estimated ejection  fraction was in the range of 20% to 25%. Severe diffuse  hypokinesis with distinct regional wall motion abnormalities.  There is disproportionately severe hypokinesis and thinning of  the apex, anterior septum and  anterior wall. However, there is  severe dyssynchrony , making it difficult to assess regional  function. Features are consistent with a pseudonormal left  ventricular filling pattern, with concomitant abnormal relaxation  and increased filling pressure (grade 2 diastolic dysfunction).  Acoustic contrast opacification revealed no evidence ofthrombus.  - Ventricular septum: Septal motion showed abnormal function,  dyssynergy, and paradox.  - Aortic valve: There was trivial regurgitation.  - Mitral valve: There was mild regurgitation.  - Left atrium: The atrium was moderately dilated.  - Pulmonary arteries: Systolic pressure was moderately increased.  PA peak pressure: 53 mm Hg (S).    ASSESSMENT AND PLAN:  1.  Chronic combined systolic diastolic heart failure: Most recent echocardiogram 07/10/2016 revealing ejection fraction of 20% to 25% with severe diffuse hypokinesis.  The patient is being followed by advanced heart failure clinic with recent hospitalization 02/23/2018 for decompensated CHF and biventricular failure.   His weight has been well maintained.  He is currently on Entresto 4951 twice daily.  Blood pressure is low normal for his EF.  His wife is adamant about his medications being taken on time and daily weights.  I will continue him on his current medication regimen and defer to advanced heart failure clinic for ongoing management.  2.  Coronary artery disease: History of CAD with PCI to the right coronary artery and PTCA to the RPDA.  Most recent cardiac cath revealed patent RCA stents on 05/2016.  He will continue secondary management with blood pressure control, dual antiplatelet therapy, and statin therapy. Refer for cardiac rehab maintenance   3.  Ventricular tachycardia: He is status post ICD implantation for secondary prevention.  He is followed by Dr. Caryl Comes with ongoing remote interrogation.  He is to see Dr. Caryl Comes in March 2021 for discussion of need to upgrade  CRT.   4.  Hypercholesterolemia: Continues on atorvastatin 80 mg daily with goal of LDL less than 70.  Most recent labs reveal LDL of 80 on 09/22/2017.  Labs are followed by PCP.  If not completed on next  office visit we will draw these.  5.  Depression: He has had a lot of tragedy in his life over the last 3 years.  He is on Effexor, and has recently been placed on Prozac but has not started taking the Prozac yet.  I have asked him to follow-up with his PCP concerning the need to start this as he was uncertain whether or not this would work for him.  Current medicines are reviewed at length with the patient today.  I have spent 50 minutes  dedicated to the care of this patient on the date of this encounter to include pre-visit review of records, assessment, management and diagnostic testing,with shared decision making.  Labs/ tests ordered today include: None  Phill Myron. West Pugh, ANP, AACC   03/06/2019 2:06 PM    [facility] [place] [address] Office [phone] Fax [phone]  Notice: This dictation was prepared with Dragon dictation along with smaller phrase technology. Any transcriptional errors that result from this process are unintentional and may not be corrected upon review.

## 2019-03-06 ENCOUNTER — Telehealth: Payer: Self-pay | Admitting: Adult Health

## 2019-03-06 ENCOUNTER — Ambulatory Visit (INDEPENDENT_AMBULATORY_CARE_PROVIDER_SITE_OTHER): Payer: Medicare Other | Admitting: Adult Health

## 2019-03-06 ENCOUNTER — Encounter: Payer: Self-pay | Admitting: Adult Health

## 2019-03-06 ENCOUNTER — Other Ambulatory Visit: Payer: Self-pay

## 2019-03-06 VITALS — BP 90/60 | HR 96 | Ht 72.0 in | Wt 271.2 lb

## 2019-03-06 DIAGNOSIS — I5042 Chronic combined systolic (congestive) and diastolic (congestive) heart failure: Secondary | ICD-10-CM | POA: Diagnosis not present

## 2019-03-06 DIAGNOSIS — I255 Ischemic cardiomyopathy: Secondary | ICD-10-CM

## 2019-03-06 DIAGNOSIS — Z950 Presence of cardiac pacemaker: Secondary | ICD-10-CM

## 2019-03-06 DIAGNOSIS — F32A Depression, unspecified: Secondary | ICD-10-CM

## 2019-03-06 DIAGNOSIS — I2129 ST elevation (STEMI) myocardial infarction involving other sites: Secondary | ICD-10-CM

## 2019-03-06 DIAGNOSIS — F329 Major depressive disorder, single episode, unspecified: Secondary | ICD-10-CM

## 2019-03-06 DIAGNOSIS — I2510 Atherosclerotic heart disease of native coronary artery without angina pectoris: Secondary | ICD-10-CM

## 2019-03-06 DIAGNOSIS — I43 Cardiomyopathy in diseases classified elsewhere: Secondary | ICD-10-CM | POA: Diagnosis not present

## 2019-03-06 NOTE — Addendum Note (Signed)
Addended by: Vennie Homans on: 03/06/2019 05:36 PM   Modules accepted: Orders

## 2019-03-06 NOTE — Patient Instructions (Signed)
Medication Instructions:  Continue current medications  *If you need a refill on your cardiac medications before your next appointment, please call your pharmacy*  Lab Work: None Ordered  Testing/Procedures: None Ordered  Follow-Up: At [facility], you and your health needs are our priority.  As part of our continuing mission to provide you with exceptional heart care, we have created designated Provider Care Teams.  These Care Teams include your primary Cardiologist (physician) and Advanced Practice Providers (APPs -  Physician Assistants and Nurse Practitioners) who all work together to provide you with the care you need, when you need it.  Your next appointment:   4 month(s)  The format for your next appointment:   In Person  Provider:   You may see Glenetta Hew, MD or one of the following Advanced Practice Providers on your designated Care Team:    Rosaria Ferries, PA-C  Jory Sims, DNP, ANP  Cadence Kathlen Mody, NP

## 2019-03-06 NOTE — Telephone Encounter (Signed)
Called spouse back, okayed spouse to accompany patient due to physical and mental limitations.

## 2019-03-06 NOTE — Telephone Encounter (Signed)
Patient's spouse is calling about wanting to come with her husband to his visit today, as he requires assistance in walking, and has memory issues.

## 2019-03-15 DIAGNOSIS — Z23 Encounter for immunization: Secondary | ICD-10-CM | POA: Diagnosis not present

## 2019-03-17 IMAGING — CR DG CHEST 2V
2 series · 2 of 2 positions shown · non-contrast
Comparison: 08/27/2015.

CLINICAL DATA: Shortness of breath.

EXAM:
CHEST  2 VIEW

[chest pa]
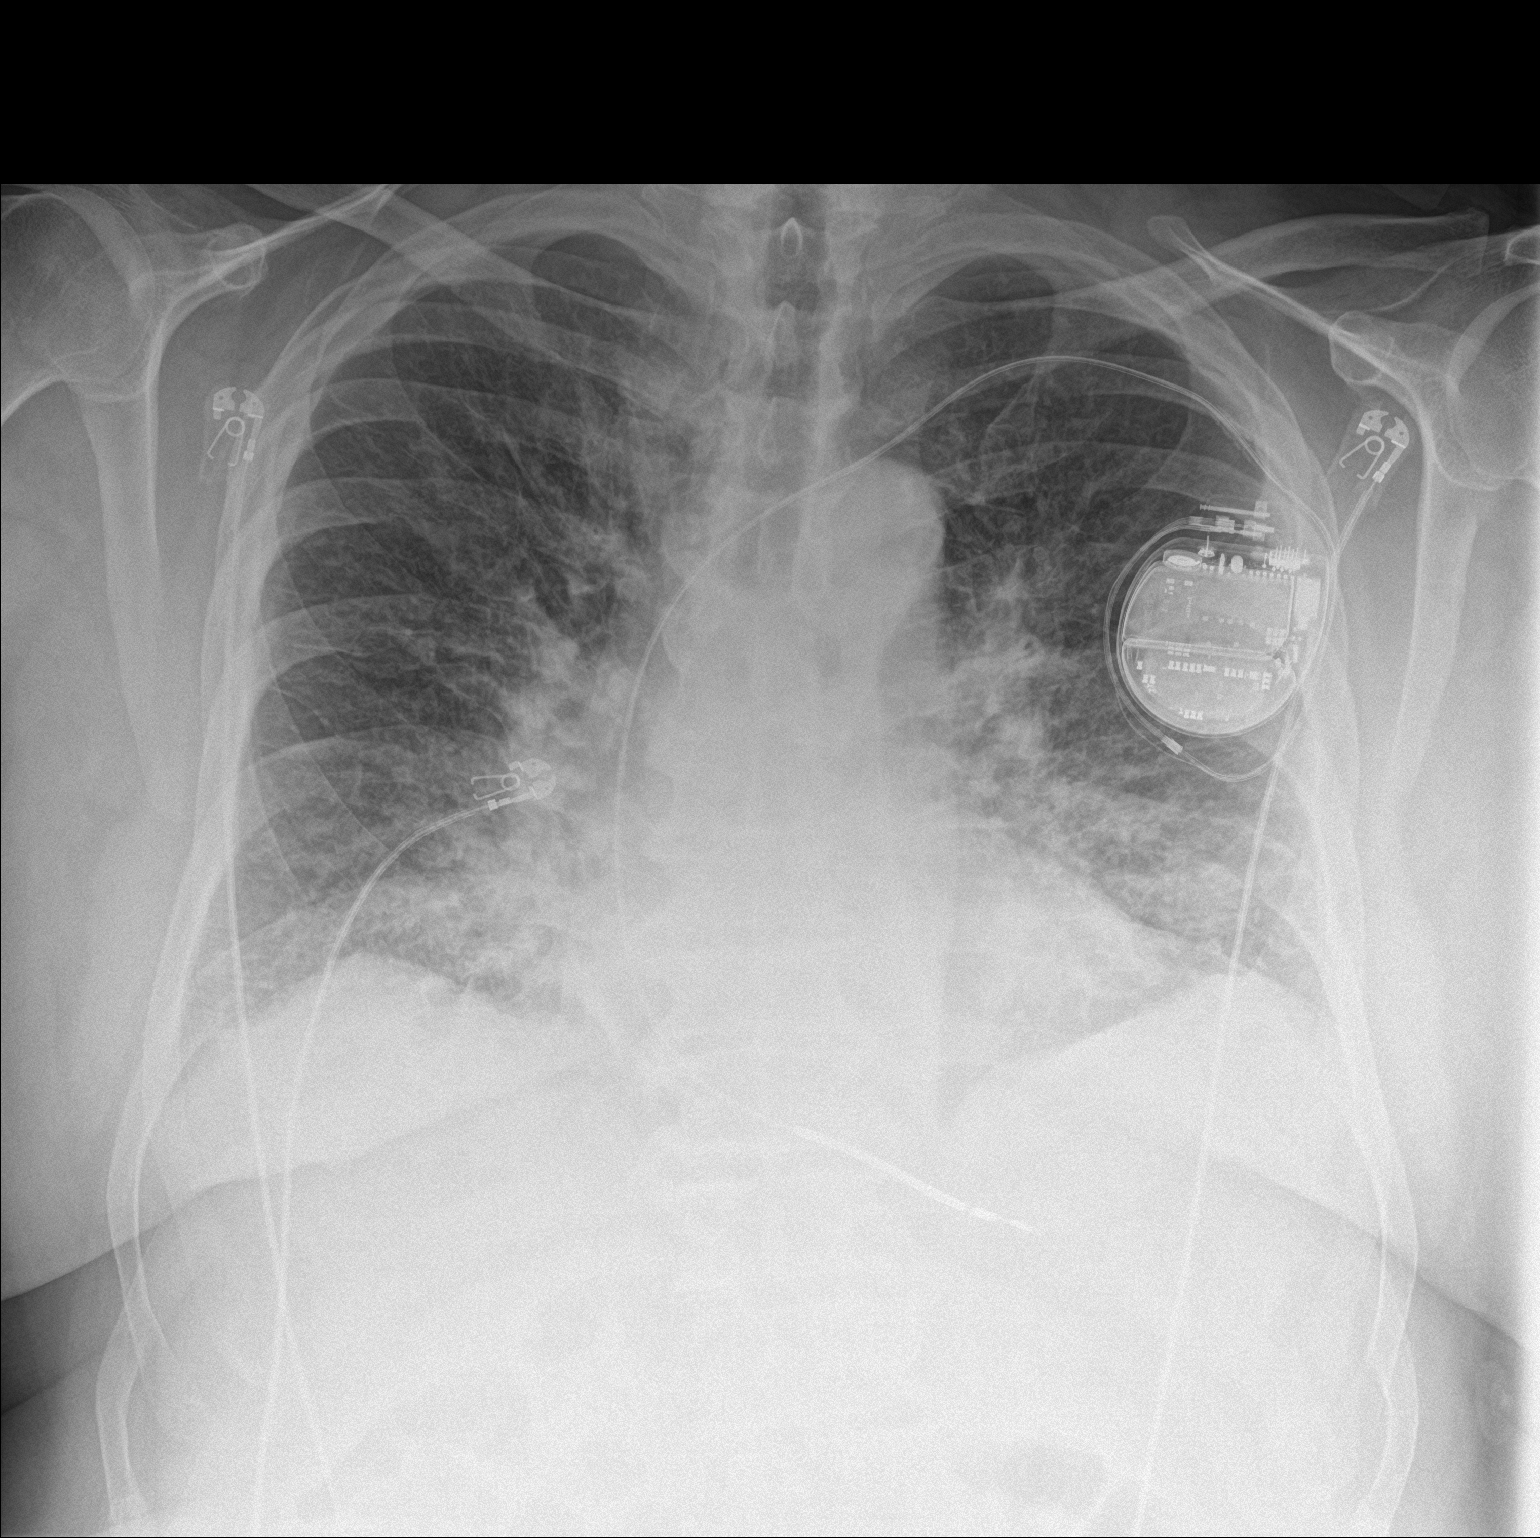

[chest lat]
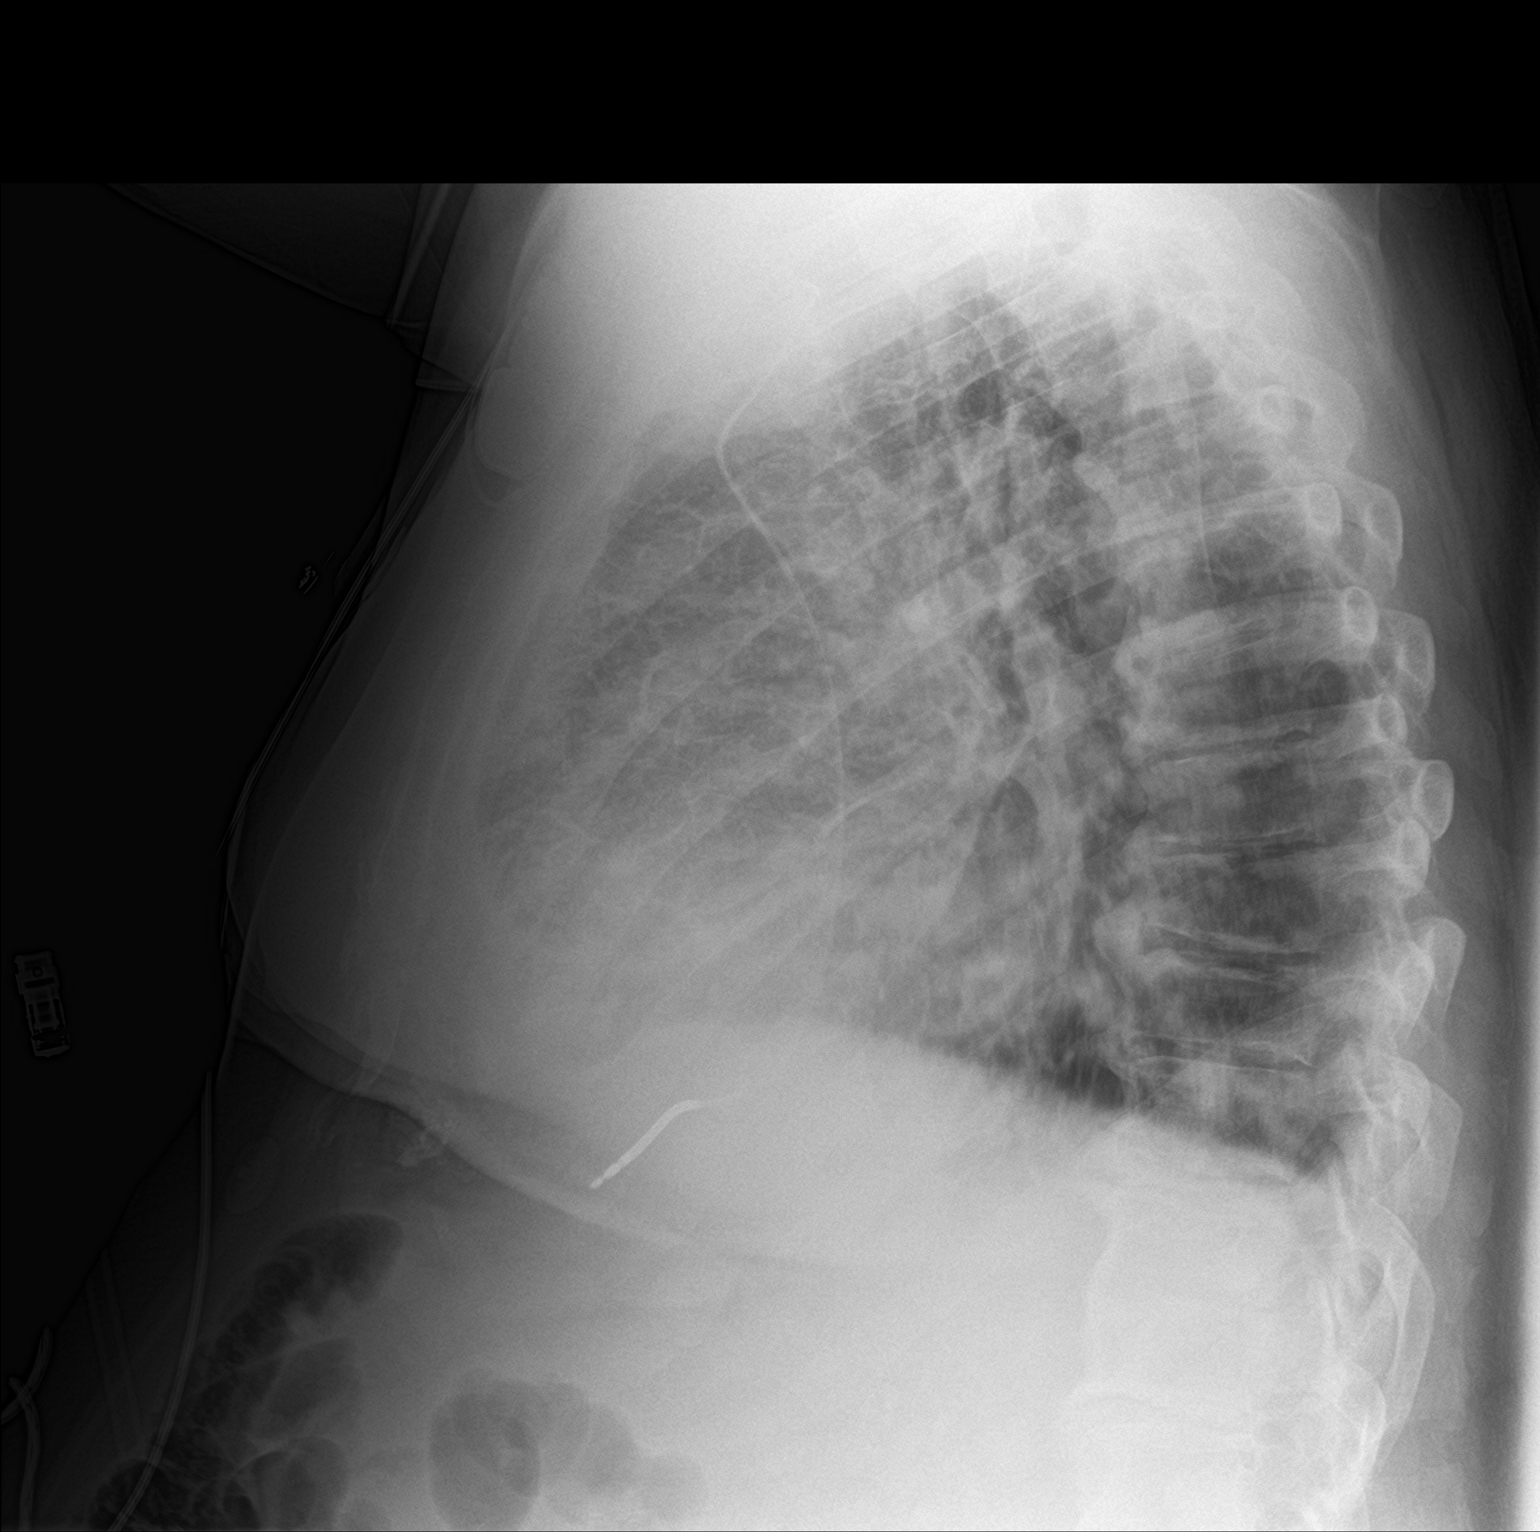

[2 of 2 positions shown; findings below may reference images not displayed]

FINDINGS: Cardiac pacer with lead tip over right ventricle. Heart size stable.
Diffuse bilateral pulmonary infiltrates/edema. No pleural effusion
or pneumothorax.
IMPRESSION: Diffuse bilateral pulmonary infiltrates/edema. Cardiac pacer stable
position. Heart size stable.

## 2019-03-20 ENCOUNTER — Other Ambulatory Visit: Payer: Self-pay

## 2019-03-20 ENCOUNTER — Ambulatory Visit: Payer: Medicare Other | Admitting: Family Medicine

## 2019-03-20 ENCOUNTER — Ambulatory Visit (INDEPENDENT_AMBULATORY_CARE_PROVIDER_SITE_OTHER): Payer: Medicare Other | Admitting: Family Medicine

## 2019-03-20 ENCOUNTER — Encounter: Payer: Self-pay | Admitting: Family Medicine

## 2019-03-20 VITALS — BP 91/63 | Wt 263.0 lb

## 2019-03-20 DIAGNOSIS — R197 Diarrhea, unspecified: Secondary | ICD-10-CM

## 2019-03-20 DIAGNOSIS — R110 Nausea: Secondary | ICD-10-CM

## 2019-03-20 DIAGNOSIS — T50Z95A Adverse effect of other vaccines and biological substances, initial encounter: Secondary | ICD-10-CM

## 2019-03-20 DIAGNOSIS — I255 Ischemic cardiomyopathy: Secondary | ICD-10-CM

## 2019-03-20 NOTE — Progress Notes (Addendum)
   Subjective:  Documentation for virtual audio only since he did not have access to video.   The patient was located at home. 2 patient identifiers used.  The provider was located in the office. The patient did consent to this visit and is aware of possible charges through their insurance for this visit.  The other persons participating in this telemedicine service were none.    Patient ID: Leonard [name], male    DOB: 10/06/1945, 74 y.o.   MRN: [id number]  HPI Chief Complaint  Patient presents with  . sick    sick since covid- vaccine last thursday. having nausea and diarrhea,no fever, achy in shoulder   Complains of nausea, diarrhea and fatigue since getting his first Covid-19 vaccine approximately 72 hours ago. States symptom onset was 12-18 hours following vaccine.  States he is gradually feeling better. Eating and drinking plenty of fluids. Urinating ok and urine is light in color.  Reports having 3 episodes of diarrhea.  States he does not feel dehydrated.   Denies fever, chills, dizziness, headache, URI symptoms, chest pain, palpitations, shortness of breath, cough, abdominal pain, vomiting.   States his BP is 91/63 and at his baseline.   Questions whether he should get his 2nd vaccine when due. He got it at [facility].   Reviewed allergies, medications, past medical, surgical, family, and social history.    Review of Systems Pertinent positives and negatives in the history of present illness.     Objective:   Physical Exam BP 91/63   Wt 263 lb (119.3 kg)   BMI 35.67 kg/m   Alert and in no acute distress.  Speaking in complete sentences without difficulty.  Normal speech, mood and thought process      Assessment & Plan:  Diarrhea, unspecified type  Nausea  Immunization reaction, initial encounter  Discussed that he seems to be having common side effects from the Covid vaccine.  No red flag symptoms.  Advised him to stay well-hydrated.  Nausea has  improved and he is eating and drinking plenty of fluids. Strict precautions that if he continues having diarrhea or any new symptoms arise, he will let me know.  I will have a low threshold to check labs if diarrhea persists. Discussed that the side effects he is reporting should not keep him from getting the second Covid vaccine.  Time spent on call was 11 minutes and in review of previous records 15 minutes total.  This virtual service is not related to other E/M service within previous 7 days.

## 2019-03-24 ENCOUNTER — Other Ambulatory Visit: Payer: Self-pay | Admitting: Family Medicine

## 2019-03-24 NOTE — Telephone Encounter (Signed)
Is this okay to refill? 

## 2019-03-24 NOTE — Telephone Encounter (Signed)
It was my understanding that this medication was stopped and he was switched to a different medication while he was hospitalized. Let him know that is why we are refusing this medication.

## 2019-03-27 NOTE — Telephone Encounter (Signed)
Pt was notified. He was put on Prozac

## 2019-04-11 ENCOUNTER — Telehealth: Payer: Self-pay | Admitting: Internal Medicine

## 2019-04-11 DIAGNOSIS — I472 Ventricular tachycardia, unspecified: Secondary | ICD-10-CM | POA: Insufficient documentation

## 2019-04-11 DIAGNOSIS — R000 Tachycardia, unspecified: Secondary | ICD-10-CM | POA: Insufficient documentation

## 2019-04-11 NOTE — Telephone Encounter (Signed)
Patient's wife calling in because she states that her husband has told her that he is to not see Dr. Caryl Comes until after he see's [facility]. Patient rescheduled his appt with [facility] for 05/18/19.Marland Kitchenpatient's wife would like to know if he should still come in for his appt tomorrow with Dr. Caryl Comes or wait until after his 05/18/19 appt.

## 2019-04-11 NOTE — Telephone Encounter (Signed)
Spoke with pt's wife, [name].  Advised per Dr Olin Pia last note it says we will see the pt in March.  Pt's wife intends on pt keeping appointment scheduled for 04/12/2019 at 245pm.

## 2019-04-12 ENCOUNTER — Other Ambulatory Visit: Payer: Self-pay

## 2019-04-12 ENCOUNTER — Telehealth: Payer: Self-pay | Admitting: Internal Medicine

## 2019-04-12 ENCOUNTER — Encounter: Payer: Self-pay | Admitting: Internal Medicine

## 2019-04-12 ENCOUNTER — Ambulatory Visit (INDEPENDENT_AMBULATORY_CARE_PROVIDER_SITE_OTHER): Payer: Medicare Other | Admitting: Internal Medicine

## 2019-04-12 ENCOUNTER — Telehealth: Payer: Self-pay

## 2019-04-12 VITALS — BP 92/70 | HR 62 | Ht 72.0 in | Wt 265.0 lb

## 2019-04-12 DIAGNOSIS — Z79899 Other long term (current) drug therapy: Secondary | ICD-10-CM

## 2019-04-12 DIAGNOSIS — R000 Tachycardia, unspecified: Secondary | ICD-10-CM

## 2019-04-12 DIAGNOSIS — I472 Ventricular tachycardia, unspecified: Secondary | ICD-10-CM

## 2019-04-12 DIAGNOSIS — I255 Ischemic cardiomyopathy: Secondary | ICD-10-CM

## 2019-04-12 DIAGNOSIS — Z95810 Presence of automatic (implantable) cardiac defibrillator: Secondary | ICD-10-CM

## 2019-04-12 DIAGNOSIS — I5042 Chronic combined systolic (congestive) and diastolic (congestive) heart failure: Secondary | ICD-10-CM | POA: Diagnosis not present

## 2019-04-12 DIAGNOSIS — E039 Hypothyroidism, unspecified: Secondary | ICD-10-CM

## 2019-04-12 LAB — CUP PACEART INCLINIC DEVICE CHECK
Date Time Interrogation Session: 20210324165941
Implantable Lead Implant Date: 20170807
Implantable Lead Location: 753860
Implantable Pulse Generator Implant Date: 20170807
Pulse Gen Serial Number: 7377761

## 2019-04-12 MED ORDER — AMIODARONE HCL 200 MG PO TABS: 300.0000 mg | ORAL_TABLET | Freq: Every day | ORAL | 3 refills | Status: DC

## 2019-04-12 NOTE — Telephone Encounter (Signed)
Spoke with pt spouse.  Advised of [place] protocol for visitors.  We can include her by phone/ speaker phone if needed.

## 2019-04-12 NOTE — Telephone Encounter (Signed)
Patient's wife, [name], states she is requesting to accompany patient during his PHYS DEFIB CK scheduled for today, 04/12/19 at 2:45 PM with Dr. Caryl Comes. [name] states the patient refuses to go to appointment without his wife. Please advise.

## 2019-04-12 NOTE — Patient Instructions (Signed)
Medication Instructions:   Your physician has recommended you make the following change in your medication:   Begin taking Amiodarone 300mg  1 and 1/2 tablets daily.   Labwork: TSH  Testing/Procedures: None ordered.  Follow-Up: Your physician wants you to follow-up in: 4 months with Dr Caryl Comes. You will receive a reminder letter in the mail two months in advance. If you don't receive a letter, please call our office to schedule the follow-up appointment.  Remote monitoring is used to monitor your Pacemaker of ICD from home. This monitoring reduces the number of office visits required to check your device to one time per year. It allows [place] to keep an eye on the functioning of your device to ensure it is working properly.  Any Other Special Instructions Will Be Listed Below (If Applicable).  If you need a refill on your cardiac medications before your next appointment, please call your pharmacy.

## 2019-04-12 NOTE — Progress Notes (Signed)
Opened in Error.

## 2019-04-12 NOTE — Telephone Encounter (Signed)
Spoke with pt's wife to review pt's medications as pt states his wife manages his medications and he is unsure what he is taking.  Pt's wife confirms pt is taking medications as listed with addition of Furosemide 40mg  daily prescribed by Dr Ellyn Hack and Spironolactone 25mg  1/2 tablet daily.

## 2019-04-12 NOTE — Progress Notes (Signed)
Patient Care Team: Girtha Rm, NP-C as PCP - General (Family Medicine) Leonie Man, MD as PCP - Cardiology (Cardiology) Deboraha Sprang, MD as PCP - Electrophysiology (Cardiology) Bensimhon, Shaune Pascal, MD as PCP - Advanced Heart Failure (Cardiology)   HPI  Leonard [name] is a 74 y.o. male Seen in follow-up for ventricular tachycardia and ICD implantation for secondary prevention 8/17. He had presented in the context of ischemic heart disease prior PCI with sustained wide complex tachycardia and was cardioverted. EP testing confirmed ventricular tachycardia.  9/17   appropriate therapy for polymorphic ventricular tachycardia with some degree of long short coupling>>  beta blockers were increased.   Date Cr K Hgb Mg  9/17  0.93 4.4  1.5  5/18  1.41 4.9     12/20 1.98 3.9<<3.4 15.5    DATE TEST EF   8/17  Cath  35-40 % Widely patent right coronary stents,Ostial 50-60% LAD  5/18   Echo  20-25 %   5/18  Cath  Findings unchanged   1/19 Echo 15-20%   11/20 Echo  15-20%       Was hospitalized 11-12/20 for refractory heart failure.  Required milrinone and IV diuretics.  Followed by CHF clinic.  Associated with a nearly 30 pound weight loss which has been stable.  Discussing CRT upgrade, they would like to defer until after Covid  He is doing well with no breathing limitations,  He has been able to keep his weight off  Despite BP sys 90's no LH upon standing  No palpitatoions    He tells me that his daughter died on Halloween 2017. She had a diving accident at the age of 59 and been quadriplegic for 25 years. She had been incredibly vigorous girl. He showed me her high school graduation picture.She was as he reported beautiful.  Her name was [name]   Past Medical History:  Diagnosis Date  . AICD (automatic cardioverter/defibrillator) present 08/26/2015   STJ  . Anxiety   . Atherosclerotic heart disease of native coronary artery with angina pectoris ([place])  07/2015   a. 07/2015 Posterior STEMI/PCI: LM nl, LAd 40ost, RI 40, RCA 168m (3.0x22 Resolute Integrity DES distal, 3.0x12 Resolute Integrity DES prox), 90d (PTCA), RPDA small, nl, RPLB2 100 - too small for PTCA. -- patent in f/u cath 08/2015 & 05/2016 - patent RCA stents (~10%), RI 40%, LM ~30% ost LAD ~40%.  . Chronic combined systolic and diastolic CHF (congestive heart failure) ([place]) 07/2015   a. 07/2015 Ehco: EF 45-50%, basal-mid inf and infsept AK, inflat, apical inf, and apical septal HK, Gr1 DD, triv MR.;; b - Echo 05/2016 -> EF 20-25%, Gr 2 DD.  Severe diffuse HK disproportionately severe HK and thinning of apex, anterior septum and anterior wall-with septal dyssynchrony.  PAP ~53 mmHg.  [name] Hyperlipidemia with target low density lipoprotein (LDL) cholesterol less than 70 mg/dL   . Hypertensive heart disease   . Ischemic cardiomyopathy 07/2015 - 05/2016   a. 07/2015 Echo: EF 45-50%.; Follow-up Echo 08/23/2015: EF 50-55% with mild inferior hypokinesis --> 05/2016: EF 20-25%, diffuse HK. PAP ~53 mmHg.  . Kidney disease, chronic, stage [name] (GFR 30-59 ml/min) 08/12/2015  . Morbid obesity ([place])   . ST elevation myocardial infarction (STEMI) of inferior wall ([place]) 08/12/2015   Subacute presentation for inferior MI with post infarction angina on the following day. EKG still shows injury current. Therefore decided to proceed with intervention of 100% mRCA thrombotic lesion.  [name]  Sustained ventricular fibrillation ([place]) 08/22/2015   Underwent EP evaluation with final ICD implantation.    Past Surgical History:  Procedure Laterality Date  . CARDIAC CATHETERIZATION N/A 08/12/2015   Procedure: Left Heart Cath and Coronary Angiography;  Surgeon: Leonie Man, MD;  Location: [facility];  Service: Cardiovascular: Inferior STEMI: 100% mid RCA, 90% distal RCA, 100% RPL. 40% ostial and proximal LAD and branch of ramus.  [name] CARDIAC CATHETERIZATION N/A 08/26/2015   Procedure: Left Heart Cath and Coronary  Angiography;  Surgeon: Belva Crome, MD;  Location: [place] [facility];  Service: Cardiovascular: To evaluate sustained VT. Widely patent RCA stent and distal PTCA site. Also patent RPL branch. LAD lesion appeared to be more consistent with 50-55% and 40%. Otherwise stable from previous cath. EF estimated 35-45%.  [name] CARDIOVERSION N/A 08/23/2015   Procedure: CARDIOVERSION;  Surgeon: Deboraha Sprang, MD;  Location: [facility];  Service: Cardiovascular;  Laterality: N/A;  . CORONARY ANGIOPLASTY WITH STENT PLACEMENT  08/12/2015   Mid RCA 100% reduced to 0% with 2 overlapping Resolute DES.  3.0 x 22 mm with a 3.0 x 12 mm prox overlarp (postdilated to 3.4 mm); PTCA of dRCA 90%. (Very dificult,complex case - tortuous, [place] RCA - unable to advance longer stents.  RPL2 noted to have thromboembolic 761% occlusion - unable to reach.  . ELECTROPHYSIOLOGIC STUDY N/A 08/26/2015   Procedure: Electrophysiology Study;  Surgeon: Deboraha Sprang, MD;  Location: [place] [facility];  Service: Cardiovascular;  Laterality: N/A;  . EP IMPLANTABLE DEVICE N/A 08/26/2015   Procedure: ICD Implant;  Surgeon: Deboraha Sprang, MD;  Location: [place] [facility];  Service: Cardiovascular;  Laterality: LEFT:  St Jude ICD, serial number [id number].   [name] HEMORRHOID SURGERY    . RIGHT/LEFT HEART CATH AND CORONARY ANGIOGRAPHY N/A 06/10/2016   Procedure: Right/Left Heart Cath and Coronary Angiography;  Surgeon: Martinique, Peter M, MD;  Location: [facility]:  Nonobstructive CAD. RCA stents PATENT.  Ost-Prox RCA, 10 %. Ost LM 30 %. Ost LAD 40 %. RI, 40 %; High PCWP  & LVEDP (23 mmHg) ; PAP 43/23/31 mmHg. CO/CI 3.89 / 1/5 (Severely Reduced)  . SKIN SURGERY    . TRANSTHORACIC ECHOCARDIOGRAM  08/13/2015; 08/23/2015   a. Mild concentric LVH. EF 45-50%. Basal-mid inferior and inferoseptal akinesis with hypokinesis of inferolateral and apical inferior wall. 1 DD. ;; b. Technically difficult study. Definity contrast administered.  Compared to a prior  echo in 07/2015, the LVEF is slightly higher at 50-55% with mild inferior hypokinesis.  . TRANSTHORACIC ECHOCARDIOGRAM  06/09/2016   EF  20-25%, GR 2 DD. Severe diffuse  hypokinesis with distinct regional wall motion abnormalities.   There is disproportionately severe hypokinesis and thinning of the apex, anterior septum and anterior wall (septal dyssynchrony). However, there is severe dyssynchrony , making it difficult to assess regional function.  PA P ~53 mmHg  . TRANSTHORACIC ECHOCARDIOGRAM  01/2017   Severely reduced EF.  GI to DD.  Moderate MR.  Mild to have a moderately elevated PAP (peak 72mmHg)    Current Outpatient Medications  Medication Sig Dispense Refill  . acetaminophen (TYLENOL) 325 MG tablet Take 2 tablets (650 mg total) by mouth every 4 (four) hours as needed for headache or mild pain.    [name] amiodarone (PACERONE) 200 MG tablet Take 1 tablet (200 mg total) by mouth 2 (two) times daily. 180 tablet 3  . aspirin EC 81 MG tablet Take 1 tablet (  81 mg total) by mouth daily.    [name] atorvastatin (LIPITOR) 80 MG tablet TAKE 1 TABLET BY MOUTH DAILY AT 6 PM; MAKE ANNUAL APPOINTMENT WITH DOCTOR Hennie Gosa FOR FUTURE REFILLS 90 tablet 3  . Cholecalciferol (VITAMIN D) 50 MCG (2000 UT) CAPS Take 0.0005 capsules (1 Units total) by mouth daily. 30 capsule 1  . Coenzyme Q10 (COQ10) 200 MG CAPS Take 200 mg by mouth daily.    [name] ENTRESTO 49-51 MG Take 1 tablet by mouth 2 (two) times daily.    [name] FLUoxetine (PROZAC) 10 MG capsule Take 1 capsule (10 mg total) by mouth daily. 30 capsule 0  . hydrocerin (EUCERIN) CREA Apply 1 application topically 2 (two) times daily. 113 g 0  . MAGNESIUM-OXIDE 400 (241.3 Mg) MG tablet Take 1 tablet by mouth 2 (two) times daily.    . metoprolol succinate (TOPROL-XL) 50 MG 24 hr tablet Take 50 mg by mouth daily.    [name] spironolactone (ALDACTONE) 25 MG tablet Take 25 mg by mouth daily.    . ticagrelor (BRILINTA) 90 MG TABS tablet TAKE 1 TABLET BY MOUTH TWICE DAILY(NEEDS OFFICE VISIT  FOR REFILLS) 180 tablet 2  . torsemide (DEMADEX) 20 MG tablet Take 1 tablet (20 mg total) by mouth daily. (Patient taking differently: Take 20 mg by mouth daily. Take two tablets daily.) 30 tablet 1  . venlafaxine (EFFEXOR) 75 MG tablet Take 75 mg by mouth daily.     No current facility-administered medications for this visit.    Allergies  Allergen Reactions  . Clams [Shellfish Allergy] Shortness Of Breath, Itching and Swelling  . Shellfish-Derived Products Shortness Of Breath, Itching and Swelling  . Other Itching and Other (See Comments)    Ragweed causes watery eyes, itching, sneezing  . Sulfa Antibiotics Other (See Comments)    Pt unsure of reaction, mother told him he was allergic       Review of Systems negative except from HPI and PMH  Physical Exam BP 92/70   Pulse 62   Ht 6' (1.829 m)   Wt 265 lb (120.2 kg)   SpO2 98%   BMI 35.94 kg/m  Well developed and well nourished in no acute distress HENT normal Neck supple   Clear Device pocket well healed; without hematoma or erythema.  There is no tethering  Regular rate and rhythm, no murmur Abd-soft with active BS No Clubbing cyanosis  tr edema Skin-warm and dry; lower extremity erythema  A & Oriented  Grossly normal sensory and motor function  ECG sinus @ 62 29/23/59     Assessment and  Plan Ischemic cardiomyopathy  Congestive heart failure-chronic-systolic  Ventricular tachycardia-monomorphic and polymorphic  Implantable defibrillator-St. Jude  LBBB  Hypothyroidism  Sinus tachycardia   Renal insufficiency acute/chronic   Depression    Functional status is considerably improved.  Did denies respiratory limitations.  Hence, is inclined to defer CRT upgrade, recommendation with which he has concurred.  Its purposes are 2, 1 his symptoms and the other is a secondary impact on outcomes.  Euvolemic.  No intercurrent syncope.  No ischemia.    last TSH was elevated.  We will recheck thyroid status  today.

## 2019-04-13 ENCOUNTER — Encounter (HOSPITAL_COMMUNITY): Payer: Medicare Other

## 2019-04-13 ENCOUNTER — Encounter: Payer: Medicare Other | Admitting: Vascular Surgery

## 2019-04-13 DIAGNOSIS — Z23 Encounter for immunization: Secondary | ICD-10-CM | POA: Diagnosis not present

## 2019-04-13 LAB — TSH: TSH: 20.6 u[IU]/mL — ABNORMAL HIGH (ref 0.450–4.500)

## 2019-04-13 MED ORDER — LEVOTHYROXINE SODIUM 25 MCG PO TABS: 25.0000 ug | ORAL_TABLET | Freq: Every day | ORAL | 3 refills | Status: DC

## 2019-04-14 ENCOUNTER — Telehealth: Payer: Self-pay

## 2019-04-14 NOTE — Telephone Encounter (Signed)
Patient returning call.

## 2019-04-14 NOTE — Telephone Encounter (Signed)
Call to patient to review labs.    Pt verbalized understanding and has no further questions at this time.    Advised pt to call for any further questions or concerns.  Order placed for rx.

## 2019-04-14 NOTE — Telephone Encounter (Signed)
Attempted to call patient. LMTCB 04/14/2019   

## 2019-04-14 NOTE — Telephone Encounter (Signed)
-----   Message from Deboraha Sprang, MD sent at 04/13/2019  9:37 PM EDT ----- Please Inform Patient Thyroid has been rendered underactive by the amiodarone [place]se##start him on synthroid 25 mcg and have him followup with his pcp within about 8 weeks   Thanks

## 2019-04-20 HISTORY — PX: OTHER SURGICAL HISTORY: SHX169

## 2019-04-28 ENCOUNTER — Telehealth: Payer: Self-pay | Admitting: Internal Medicine

## 2019-04-28 ENCOUNTER — Ambulatory Visit (INDEPENDENT_AMBULATORY_CARE_PROVIDER_SITE_OTHER): Payer: Medicare Other | Admitting: *Deleted

## 2019-04-28 DIAGNOSIS — I255 Ischemic cardiomyopathy: Secondary | ICD-10-CM

## 2019-04-28 LAB — CUP PACEART REMOTE DEVICE CHECK
Battery Remaining Longevity: 71 mo
Battery Remaining Percentage: 67 %
Battery Voltage: 2.93 V
Brady Statistic RV Percent Paced: 1 %
Date Time Interrogation Session: 20210409031037
HighPow Impedance: 64 Ohm
HighPow Impedance: 64 Ohm
Implantable Lead Implant Date: 20170807
Implantable Lead Location: 753860
Implantable Pulse Generator Implant Date: 20170807
Lead Channel Impedance Value: 410 Ohm
Lead Channel Pacing Threshold Amplitude: 0.75 V
Lead Channel Pacing Threshold Pulse Width: 0.5 ms
Lead Channel Sensing Intrinsic Amplitude: 10.1 mV
Lead Channel Setting Pacing Amplitude: 2.5 V
Lead Channel Setting Pacing Pulse Width: 0.5 ms
Lead Channel Setting Sensing Sensitivity: 0.5 mV
Pulse Gen Serial Number: 7377761

## 2019-04-28 NOTE — Progress Notes (Signed)
ICD Remote  

## 2019-04-28 NOTE — Telephone Encounter (Signed)
New message   Pt c/o Shortness Of Breath: STAT if SOB developed within the last 24 hours or pt is noticeably SOB on the phone  1. Are you currently SOB (can you hear that pt is SOB on the phone)? No   2. How long have you been experiencing SOB? For the past week per the patient's wife   3. Are you SOB when sitting or when up moving around? Moving around   4. Are you currently experiencing any other symptoms? No

## 2019-04-28 NOTE — Telephone Encounter (Signed)
Spoke with pt and advised his wife had called earlier stating pt has had some SOB.  Pt states he did not know his wife had called.  Pt states he had some mild momentary SOB last night lying flat and reading.  Pt states he raised his had and was fine.  Pt denies CP, edema, or cough.  Pt states he is taking medication as prescribed and is feeling fine.  Pt advised if he develops any further symptoms to contact office or if severe to go to ED for further evaluation.  Pt advised to continue medications and routine follow up.  Pt verbalizes understanding and agrees with current plan.

## 2019-04-28 NOTE — Telephone Encounter (Signed)
Patient returned your call.

## 2019-04-28 NOTE — Telephone Encounter (Signed)
Attempted phone call to pt.  Left voicemail message for pt to contact RN at [phone]. 

## 2019-05-04 ENCOUNTER — Telehealth: Payer: Self-pay

## 2019-05-04 ENCOUNTER — Telehealth: Payer: Self-pay | Admitting: Internal Medicine

## 2019-05-04 ENCOUNTER — Telehealth: Payer: Self-pay | Admitting: Licensed Clinical Social Worker

## 2019-05-04 NOTE — Telephone Encounter (Signed)
CSW received call from [facility] stating that pt wife had reached out and had concerns with pt current lack of mobility decline and wanting assistance getting pt set up with rehab services.  CSW called pt wife who confirms pt has been declining at home without PT and hopefully to get him reenrolled in services.  Per wife pt had been part of the Cardiac Rehab program last year but had to stop going because of the pandemic- would like to get him reenrolled in this program to help him get more energy and be able to move around the house easier.  CSW called [facility] to request pt Cardiology office place an order for Cardiac rehab- message provided to the RN regarding request for cardiac rehab order and I left my number for further questions or updates.  CSW will continue to follow and assist as needed  Jorge Ny, [facility] Desk#: [phone] Cell#: [phone]

## 2019-05-04 NOTE — Telephone Encounter (Signed)
Palliative Medicine RN Note: Rec'd a call from [name], Mr Clendenin's wife. She reports that Khiem is having more difficulty getting to the bathroom in time, as well as increased trouble walking, discolored feet & ankles. They are watching his sodium intake. He has a walker for home use and had PT after his last hospital discharge. Our NP Wadie Lessen saw him during his admission in December.   [name] requested help getting more therapy in the home. Unfortunately, our team only sees patients who are currently admitted to the hospital. She reports that he sees the HF clinic.  I called HF SW [place]. She reports that Ekansh is a patient at the [facility] clinic but that she can also assist with SW-related needs for those patients. She will look into how she can assist and call [name] back.  I called [name] back to provide update; no answer, so left voicemail explaining that Eliezer Lofts was going to look into this and would be calling her back. There is no guaranteed time frame for a return call, as Eliezer Lofts will have to figure out resources/needs/hx/etc.  Marjie Skiff. Charlina Dwight, RN, BSN, [facility] Palliative Medicine Team 05/04/2019 2:07 PM Office [phone]

## 2019-05-04 NOTE — Telephone Encounter (Signed)
Will send call to primary card

## 2019-05-04 NOTE — Telephone Encounter (Signed)
Eliezer Lofts spoke with patient's wife who had some concerns about his mobility. She wanted to get the patient back into cardiac rehab. He was in before the pandemic and had to stop because of it. Eliezer Lofts is calling to see if [name] would be a good candidate for getting back into the program. Please advise.

## 2019-05-05 NOTE — Telephone Encounter (Signed)
If he qualifies I think it would be great to get him back to cardiac rehab. If not, please arrange outpatient PT/OT evals. thanks

## 2019-05-09 ENCOUNTER — Telehealth: Payer: Self-pay | Admitting: Cardiology

## 2019-05-09 NOTE — Telephone Encounter (Signed)
This is silly -- so many messages.    I though that this was a settled manner with previous messages --> sure, I am happy to put is a Cardiac Rehab Rquest as recommended by Dr. Haroldine Laws.  I concur.  Glenetta Hew, MD

## 2019-05-09 NOTE — Telephone Encounter (Signed)
New Message   Leonard [name] is calling from the heart failure clinic and is wondering if Dr Ellyn Hack can place an order for the pt for cardiac rehab   Please call back

## 2019-05-09 NOTE — Telephone Encounter (Signed)
Will route to Dr. Ellyn Hack to see if he is agreeable to refer pt to cardiac rehab.

## 2019-05-10 ENCOUNTER — Telehealth (HOSPITAL_COMMUNITY): Payer: Self-pay | Admitting: *Deleted

## 2019-05-11 ENCOUNTER — Other Ambulatory Visit: Payer: Self-pay | Admitting: *Deleted

## 2019-05-11 DIAGNOSIS — L03119 Cellulitis of unspecified part of limb: Secondary | ICD-10-CM

## 2019-05-12 NOTE — Telephone Encounter (Signed)
Active referral in the system.

## 2019-05-15 ENCOUNTER — Telehealth: Payer: Self-pay

## 2019-05-15 NOTE — Telephone Encounter (Signed)
Spoke wit  Patient today letting him know that cardiac rehab has tried to get in touch with him a few time and that if you could give them a call so that we can establish which rehab he needed to be apart of and to give [place] a call back if there is any questions.

## 2019-05-17 ENCOUNTER — Telehealth (HOSPITAL_COMMUNITY): Payer: Self-pay

## 2019-05-17 NOTE — Telephone Encounter (Signed)

## 2019-05-18 ENCOUNTER — Encounter: Payer: Self-pay | Admitting: Vascular Surgery

## 2019-05-18 ENCOUNTER — Other Ambulatory Visit: Payer: Self-pay

## 2019-05-18 ENCOUNTER — Ambulatory Visit (INDEPENDENT_AMBULATORY_CARE_PROVIDER_SITE_OTHER): Payer: Medicare Other | Admitting: Vascular Surgery

## 2019-05-18 ENCOUNTER — Ambulatory Visit (HOSPITAL_COMMUNITY)
Admission: RE | Admit: 2019-05-18 | Discharge: 2019-05-18 | Disposition: A | Discharge status: Home / Self Care | Payer: Medicare Other | Source: Ambulatory Visit | Attending: Surgery | Admitting: Surgery

## 2019-05-18 VITALS — BP 90/57 | HR 60 | Temp 97.9°F | Resp 20 | Ht 72.0 in | Wt 267.0 lb

## 2019-05-18 DIAGNOSIS — L03119 Cellulitis of unspecified part of limb: Secondary | ICD-10-CM

## 2019-05-18 DIAGNOSIS — M7989 Other specified soft tissue disorders: Secondary | ICD-10-CM | POA: Diagnosis not present

## 2019-05-18 DIAGNOSIS — I255 Ischemic cardiomyopathy: Secondary | ICD-10-CM

## 2019-05-18 NOTE — Progress Notes (Signed)
Referring Physician: Dr Caryl Comes  Patient name: Leonard [name] MRN: [id number] DOB: 04-26-1945 Sex: male  REASON FOR CONSULT: Chronic leg swelling  HPI: Leonard [name] is a 74 y.o. male, with a 40-year history of intermittent leg swelling in both legs.  He is never had a significant ulcer.  He has no history of DVT.  He has not worn compression stockings in the past.  His wife states he had a recent episode where his legs are so swollen that fluid was leaking from his skin.  Of note he does have a history of congestive failure with a known ejection fraction of 15 to 20%.  At the time he was having severe swelling he was undergoing diuresis for a congestive failure episode.  He does not have any significant varicosities in his lower extremities.  He does not really have any complaints about his legs in general.  He states that the main reason he was sent here was Dr. Caryl Comes wanted an opinion regarding his leg swelling.  Other medical problems include coronary artery disease, hyperlipidemia, cardiomyopathy, prior history of V. fib with AICD.  All of these are currently stable.  Past Medical History:  Diagnosis Date   AICD (automatic cardioverter/defibrillator) present 08/26/2015   STJ   Anxiety    Atherosclerotic heart disease of native coronary artery with angina pectoris ([place]) 07/2015   a. 07/2015 Posterior STEMI/PCI: LM nl, LAd 40ost, RI 40, RCA 15m (3.0x22 Resolute Integrity DES distal, 3.0x12 Resolute Integrity DES prox), 90d (PTCA), RPDA small, nl, RPLB2 100 - too small for PTCA. -- patent in f/u cath 08/2015 & 05/2016 - patent RCA stents (~10%), RI 40%, LM ~30% ost LAD ~40%.   Chronic combined systolic and diastolic CHF (congestive heart failure) ([place]) 07/2015   a. 07/2015 Ehco: EF 45-50%, basal-mid inf and infsept AK, inflat, apical inf, and apical septal HK, Gr1 DD, triv MR.;; b - Echo 05/2016 -> EF 20-25%, Gr 2 DD.  Severe diffuse HK disproportionately severe HK and thinning of  apex, anterior septum and anterior wall-with septal dyssynchrony.  PAP ~53 mmHg.   Hyperlipidemia with target low density lipoprotein (LDL) cholesterol less than 70 mg/dL    Hypertensive heart disease    Ischemic cardiomyopathy 07/2015 - 05/2016   a. 07/2015 Echo: EF 45-50%.; Follow-up Echo 08/23/2015: EF 50-55% with mild inferior hypokinesis --> 05/2016: EF 20-25%, diffuse HK. PAP ~53 mmHg.   Kidney disease, chronic, stage [name] (GFR 30-59 ml/min) 08/12/2015   Morbid obesity ([place])    ST elevation myocardial infarction (STEMI) of inferior wall ([place]) 08/12/2015   Subacute presentation for inferior MI with post infarction angina on the following day. EKG still shows injury current. Therefore decided to proceed with intervention of 100% mRCA thrombotic lesion.   Sustained ventricular fibrillation ([place]) 08/22/2015   Underwent EP evaluation with final ICD implantation.   Past Surgical History:  Procedure Laterality Date   CARDIAC CATHETERIZATION N/A 08/12/2015   Procedure: Left Heart Cath and Coronary Angiography;  Surgeon: Leonie Man, MD;  Location: [facility];  Service: Cardiovascular: Inferior STEMI: 100% mid RCA, 90% distal RCA, 100% RPL. 40% ostial and proximal LAD and branch of ramus.   CARDIAC CATHETERIZATION N/A 08/26/2015   Procedure: Left Heart Cath and Coronary Angiography;  Surgeon: Belva Crome, MD;  Location: [facility];  Service: Cardiovascular: To evaluate sustained VT. Widely patent RCA stent and distal PTCA site. Also patent RPL branch. LAD lesion appeared to be more  consistent with 50-55% and 40%. Otherwise stable from previous cath. EF estimated 35-45%.   CARDIOVERSION N/A 08/23/2015   Procedure: CARDIOVERSION;  Surgeon: Deboraha Sprang, MD;  Location: [facility];  Service: Cardiovascular;  Laterality: N/A;   CORONARY ANGIOPLASTY WITH STENT PLACEMENT  08/12/2015   Mid RCA 100% reduced to 0% with 2 overlapping Resolute DES.  3.0 x 22 mm with a 3.0 x 12 mm prox  overlarp (postdilated to 3.4 mm); PTCA of dRCA 90%. (Very dificult,complex case - tortuous, [place] RCA - unable to advance longer stents.  RPL2 noted to have thromboembolic 921% occlusion - unable to reach.   ELECTROPHYSIOLOGIC STUDY N/A 08/26/2015   Procedure: Electrophysiology Study;  Surgeon: Deboraha Sprang, MD;  Location: [facility];  Service: Cardiovascular;  Laterality: N/A;   EP IMPLANTABLE DEVICE N/A 08/26/2015   Procedure: ICD Implant;  Surgeon: Deboraha Sprang, MD;  Location: [facility];  Service: Cardiovascular;  Laterality: LEFT:  St Jude ICD, serial number [id number].    HEMORRHOID SURGERY     RIGHT/LEFT HEART CATH AND CORONARY ANGIOGRAPHY N/A 06/10/2016   Procedure: Right/Left Heart Cath and Coronary Angiography;  Surgeon: Martinique, Peter M, MD;  Location: [facility]:  Nonobstructive CAD. RCA stents PATENT.  Ost-Prox RCA, 10 %. Ost LM 30 %. Ost LAD 40 %. RI, 40 %; High PCWP  & LVEDP (23 mmHg) ; PAP 42031-04-05 mmHg. CO/CI 3.89 / 1/5 (Severely Reduced)   SKIN SURGERY     TRANSTHORACIC ECHOCARDIOGRAM  08/13/2015; 08/23/2015   a. Mild concentric LVH. EF 45-50%. Basal-mid inferior and inferoseptal akinesis with hypokinesis of inferolateral and apical inferior wall. 1 DD. ;; b. Technically difficult study. Definity contrast administered.  Compared to a prior echo in 07/2015, the LVEF is slightly higher at 50-55% with mild inferior hypokinesis.   TRANSTHORACIC ECHOCARDIOGRAM  06/09/2016   EF  20-25%, GR 2 DD. Severe diffuse  hypokinesis with distinct regional wall motion abnormalities.   There is disproportionately severe hypokinesis and thinning of the apex, anterior septum and anterior wall (septal dyssynchrony). However, there is severe dyssynchrony , making it difficult to assess regional function.  PA P ~53 mmHg   TRANSTHORACIC ECHOCARDIOGRAM  01/2017   Severely reduced EF.  GI to DD.  Moderate MR.  Mild to have a moderately elevated PAP (peak 26mmHg)    Family  History  Problem Relation Age of Onset   Diabetes Mother    Heart disease Paternal Grandmother    Heart disease Paternal Grandfather     SOCIAL HISTORY: Social History   Socioeconomic History   Marital status: Married    Spouse name: Not on file   Number of children: Not on file   Years of education: Not on file   Highest education level: Not on file  Occupational History   Not on file  Tobacco Use   Smoking status: Never Smoker   Smokeless tobacco: Never Used  Substance and Sexual Activity   Alcohol use: Yes    Alcohol/week: 3.0 - 4.0 standard drinks    Types: 3 - 4 Shots of liquor per week    Comment: 3-4 glasses of scotch 3-4 days per week   Drug use: No   Sexual activity: Not on file  Other Topics Concern   Not on file  Social History Narrative   He tells me that his daughter died on Halloween 04/24/2015. She had a diving accident at the age of 23 and been quadriplegic for  25 years. She had been incredibly vigorous girl. He showed me her high school graduation picture.She was as he reported beautiful.  Her name was [name].   Social Determinants of Health   Financial Resource Strain:    Difficulty of Paying Living Expenses:   Food Insecurity:    Worried About Charity fundraiser in the Last Year:    Arboriculturist in the Last Year:   Transportation Needs:    Film/video editor (Medical):    Lack of Transportation (Non-Medical):   Physical Activity:    Days of Exercise per Week:    Minutes of Exercise per Session:   Stress:    Feeling of Stress :   Social Connections:    Frequency of Communication with Friends and Family:    Frequency of Social Gatherings with Friends and Family:    Attends Religious Services:    Active Member of Clubs or Organizations:    Attends Archivist Meetings:    Marital Status:   Intimate Partner Violence:    Fear of Current or Ex-Partner:    Emotionally Abused:    Physically Abused:     Sexually Abused:     Allergies  Allergen Reactions   Clams [Shellfish Allergy] Shortness Of Breath, Itching and Swelling   Shellfish-Derived Products Shortness Of Breath, Itching and Swelling   Other Itching and Other (See Comments)    Ragweed causes watery eyes, itching, sneezing   Sulfa Antibiotics Other (See Comments)    Pt unsure of reaction, mother told him he was allergic     Current Outpatient Medications  Medication Sig Dispense Refill   metoprolol succinate (TOPROL-XL) 50 MG 24 hr tablet Take 1 tablet (50 mg total) by mouth daily. 90 tablet 3   acetaminophen (TYLENOL) 325 MG tablet Take 2 tablets (650 mg total) by mouth every 4 (four) hours as needed for headache or mild pain.     amiodarone (PACERONE) 200 MG tablet Take 1.5 tablets (300 mg total) by mouth daily. 180 tablet 3   aspirin EC 81 MG tablet Take 1 tablet (81 mg total) by mouth daily.     atorvastatin (LIPITOR) 80 MG tablet TAKE 1 TABLET BY MOUTH DAILY AT 6 PM; MAKE ANNUAL APPOINTMENT WITH DOCTOR KLEIN FOR FUTURE REFILLS 90 tablet 3   Cholecalciferol (VITAMIN D) 50 MCG (2000 UT) CAPS Take 0.0005 capsules (1 Units total) by mouth daily. 30 capsule 1   Coenzyme Q10 (COQ10) 200 MG CAPS Take 200 mg by mouth daily.     ENTRESTO 49-51 MG Take 1 tablet by mouth 2 (two) times daily.     FLUoxetine (PROZAC) 10 MG capsule Take 1 capsule (10 mg total) by mouth daily. 30 capsule 0   furosemide (LASIX) 40 MG tablet Take 40 mg by mouth.     hydrocerin (EUCERIN) CREA Apply 1 application topically 2 (two) times daily. 113 g 0   levothyroxine (SYNTHROID) 25 MCG tablet Take 1 tablet (25 mcg total) by mouth daily before breakfast. 30 tablet 3   MAGNESIUM-OXIDE 400 (241.3 Mg) MG tablet Take 1 tablet by mouth 2 (two) times daily.     spironolactone (ALDACTONE) 25 MG tablet Take 12.5 mg by mouth daily. Take 1/2 tablet daily     ticagrelor (BRILINTA) 90 MG TABS tablet TAKE 1 TABLET BY MOUTH TWICE DAILY(NEEDS OFFICE VISIT  FOR REFILLS) 180 tablet 2   torsemide (DEMADEX) 20 MG tablet Take 1 tablet (20 mg total) by mouth daily. (Patient taking differently:  Take 20 mg by mouth daily. Take two tablets daily.) 30 tablet 1   venlafaxine (EFFEXOR) 75 MG tablet Take 75 mg by mouth daily.     No current facility-administered medications for this visit.    ROS:   General:  No weight loss, Fever, chills  HEENT: No recent headaches, no nasal bleeding, no visual changes, no sore throat  Neurologic: No dizziness, blackouts, seizures. No recent symptoms of stroke or mini- stroke. No recent episodes of slurred speech, or temporary blindness.  Cardiac: No recent episodes of chest pain/pressure, no shortness of breath at rest.  No shortness of breath with exertion.  Denies history of atrial fibrillation or irregular heartbeat  Vascular: No history of rest pain in feet.  No history of claudication.  No history of non-healing ulcer, No history of DVT   Pulmonary: No home oxygen, no productive cough, no hemoptysis,  No asthma or wheezing  Musculoskeletal:  [X]  Arthritis, [ ]  Low back pain,  [X]  Joint pain  Hematologic:No history of hypercoagulable state.  No history of easy bleeding.  No history of anemia  Gastrointestinal: No hematochezia or melena,  No gastroesophageal reflux, no trouble swallowing  Urinary: [ ]  chronic Kidney disease, [ ]  on HD - [ ]  MWF or [ ]  TTHS, [ ]  Burning with urination, [ ]  Frequent urination, [ ]  Difficulty urinating;   Skin: No rashes  Psychological: No history of anxiety,  No history of depression   Physical Examination  Vitals:   05/18/19 1309  BP: (!) 90/57  Pulse: 60  Resp: 20  Temp: 97.9 F (36.6 C)  SpO2: 97%  Weight: 267 lb (121.1 kg)  Height: 6' (1.829 m)   General:  Alert and oriented, no acute distress HEENT: Normal Neck: No JVD Cardiac: Regular Rate and Rhythm  Skin: No rash, no ulcer Extremity Pulses: Absent dorsalis pedis, posterior tibial pulses  bilaterally Musculoskeletal: Bilateral 1+ edema extending from the foot up to the knee level  Neurologic: Upper and lower extremity motor 5/5 and symmetric  DATA:  Patient had a venous reflux exam today which showed no evidence of DVT bilaterally.  Did have some reflux in the right greater saphenous vein as well as the right deep vein system.  The vein diameter was 5 to 6 mm.  He had no significant reflux in the left leg.  Specifically he had no reflux in the left saphenofemoral junction.  ASSESSMENT: Moderate reflux right lower extremity with no significant reflux in the left lower extremity.  Although he does not have normal functioning venous system bilaterally the largest contributor to her leg swelling seems to be a central problem with his cardiac function.  Of note he did not have palpable pulses in either feet but the feet were warm and pink and he may have an element of mild arterial occlusive disease.  However this would not contribute to any of the swelling symptoms.  He is currently asymptomatic from this with no claudication symptoms.   PLAN: Patient was given a prescription today for 20 to 30 mm knee-high compression stockings for controlling swelling symptoms.  He will follow up in 1 month for bilateral ABIs.  This will be to establish baseline in case he has any problems in the future.  If his ABIs are reasonable and his symptoms are controlled with compression stockings most likely he would be new able to follow-up on an as-needed basis after that visit.  He will see one of our APP is at that  next visit.   Ruta Hinds, MD [facility] Office: [phone] Pager: [phone]

## 2019-05-21 ENCOUNTER — Other Ambulatory Visit: Payer: Self-pay

## 2019-05-21 ENCOUNTER — Observation Stay (HOSPITAL_COMMUNITY)
Admission: EM | Admit: 2019-05-21 | Discharge: 2019-05-23 | Disposition: A | Discharge status: Home / Self Care | Payer: Medicare Other | Attending: Internal Medicine | Admitting: Internal Medicine

## 2019-05-21 ENCOUNTER — Emergency Department (HOSPITAL_COMMUNITY): Payer: Medicare Other

## 2019-05-21 DIAGNOSIS — W19XXXA Unspecified fall, initial encounter: Secondary | ICD-10-CM

## 2019-05-21 DIAGNOSIS — I959 Hypotension, unspecified: Secondary | ICD-10-CM | POA: Diagnosis present

## 2019-05-21 DIAGNOSIS — T07XXXA Unspecified multiple injuries, initial encounter: Secondary | ICD-10-CM

## 2019-05-21 DIAGNOSIS — I5020 Unspecified systolic (congestive) heart failure: Secondary | ICD-10-CM | POA: Diagnosis present

## 2019-05-21 DIAGNOSIS — F10929 Alcohol use, unspecified with intoxication, unspecified: Secondary | ICD-10-CM | POA: Diagnosis present

## 2019-05-21 DIAGNOSIS — I255 Ischemic cardiomyopathy: Secondary | ICD-10-CM | POA: Diagnosis present

## 2019-05-21 DIAGNOSIS — N289 Disorder of kidney and ureter, unspecified: Secondary | ICD-10-CM

## 2019-05-21 DIAGNOSIS — I2510 Atherosclerotic heart disease of native coronary artery without angina pectoris: Secondary | ICD-10-CM | POA: Diagnosis present

## 2019-05-21 DIAGNOSIS — Z95810 Presence of automatic (implantable) cardiac defibrillator: Secondary | ICD-10-CM | POA: Insufficient documentation

## 2019-05-21 DIAGNOSIS — E785 Hyperlipidemia, unspecified: Secondary | ICD-10-CM | POA: Diagnosis not present

## 2019-05-21 DIAGNOSIS — S50311A Abrasion of right elbow, initial encounter: Secondary | ICD-10-CM | POA: Insufficient documentation

## 2019-05-21 DIAGNOSIS — I130 Hypertensive heart and chronic kidney disease with heart failure and stage 1 through stage 4 chronic kidney disease, or unspecified chronic kidney disease: Secondary | ICD-10-CM | POA: Diagnosis not present

## 2019-05-21 DIAGNOSIS — R296 Repeated falls: Secondary | ICD-10-CM | POA: Insufficient documentation

## 2019-05-21 DIAGNOSIS — G4733 Obstructive sleep apnea (adult) (pediatric): Secondary | ICD-10-CM | POA: Insufficient documentation

## 2019-05-21 DIAGNOSIS — I5042 Chronic combined systolic (congestive) and diastolic (congestive) heart failure: Secondary | ICD-10-CM | POA: Insufficient documentation

## 2019-05-21 DIAGNOSIS — S80811A Abrasion, right lower leg, initial encounter: Secondary | ICD-10-CM | POA: Insufficient documentation

## 2019-05-21 DIAGNOSIS — I252 Old myocardial infarction: Secondary | ICD-10-CM | POA: Insufficient documentation

## 2019-05-21 DIAGNOSIS — Z882 Allergy status to sulfonamides status: Secondary | ICD-10-CM | POA: Insufficient documentation

## 2019-05-21 DIAGNOSIS — F419 Anxiety disorder, unspecified: Secondary | ICD-10-CM | POA: Diagnosis not present

## 2019-05-21 DIAGNOSIS — N1832 Chronic kidney disease, stage 3b: Secondary | ICD-10-CM

## 2019-05-21 DIAGNOSIS — I951 Orthostatic hypotension: Principal | ICD-10-CM | POA: Insufficient documentation

## 2019-05-21 DIAGNOSIS — F10129 Alcohol abuse with intoxication, unspecified: Secondary | ICD-10-CM | POA: Diagnosis not present

## 2019-05-21 DIAGNOSIS — R269 Unspecified abnormalities of gait and mobility: Secondary | ICD-10-CM | POA: Diagnosis not present

## 2019-05-21 DIAGNOSIS — Z955 Presence of coronary angioplasty implant and graft: Secondary | ICD-10-CM | POA: Insufficient documentation

## 2019-05-21 DIAGNOSIS — Z79899 Other long term (current) drug therapy: Secondary | ICD-10-CM | POA: Diagnosis not present

## 2019-05-21 DIAGNOSIS — Z7902 Long term (current) use of antithrombotics/antiplatelets: Secondary | ICD-10-CM | POA: Insufficient documentation

## 2019-05-21 DIAGNOSIS — E039 Hypothyroidism, unspecified: Secondary | ICD-10-CM | POA: Diagnosis not present

## 2019-05-21 DIAGNOSIS — S0081XA Abrasion of other part of head, initial encounter: Secondary | ICD-10-CM | POA: Diagnosis not present

## 2019-05-21 DIAGNOSIS — Z6834 Body mass index (BMI) 34.0-34.9, adult: Secondary | ICD-10-CM | POA: Insufficient documentation

## 2019-05-21 DIAGNOSIS — Z7982 Long term (current) use of aspirin: Secondary | ICD-10-CM | POA: Insufficient documentation

## 2019-05-21 DIAGNOSIS — S80212A Abrasion, left knee, initial encounter: Secondary | ICD-10-CM | POA: Insufficient documentation

## 2019-05-21 DIAGNOSIS — Z20822 Contact with and (suspected) exposure to covid-19: Secondary | ICD-10-CM | POA: Insufficient documentation

## 2019-05-21 DIAGNOSIS — N183 Chronic kidney disease, stage 3 unspecified: Secondary | ICD-10-CM | POA: Diagnosis present

## 2019-05-21 DIAGNOSIS — N184 Chronic kidney disease, stage 4 (severe): Secondary | ICD-10-CM | POA: Insufficient documentation

## 2019-05-21 DIAGNOSIS — Y92511 Restaurant or cafe as the place of occurrence of the external cause: Secondary | ICD-10-CM | POA: Diagnosis not present

## 2019-05-21 DIAGNOSIS — Y906 Blood alcohol level of 120-199 mg/100 ml: Secondary | ICD-10-CM | POA: Insufficient documentation

## 2019-05-21 DIAGNOSIS — R9431 Abnormal electrocardiogram [ECG] [EKG]: Secondary | ICD-10-CM | POA: Diagnosis not present

## 2019-05-21 DIAGNOSIS — S0003XA Contusion of scalp, initial encounter: Secondary | ICD-10-CM | POA: Insufficient documentation

## 2019-05-21 DIAGNOSIS — Z79890 Hormone replacement therapy (postmenopausal): Secondary | ICD-10-CM | POA: Insufficient documentation

## 2019-05-21 LAB — BASIC METABOLIC PANEL
Anion gap: 14 (ref 5–15)
BUN: 61 mg/dL — ABNORMAL HIGH (ref 8–23)
CO2: 18 mmol/L — ABNORMAL LOW (ref 22–32)
Calcium: 8.5 mg/dL — ABNORMAL LOW (ref 8.9–10.3)
Chloride: 99 mmol/L (ref 98–111)
Creatinine, Ser: 2.65 mg/dL — ABNORMAL HIGH (ref 0.61–1.24)
GFR calc Af Amer: 26 mL/min — ABNORMAL LOW (ref >60)
GFR calc non Af Amer: 22 mL/min — ABNORMAL LOW (ref >60)
Glucose, Bld: 90 mg/dL (ref 70–99)
Potassium: 4 mmol/L (ref 3.5–5.1)
Sodium: 131 mmol/L — ABNORMAL LOW (ref 135–145)

## 2019-05-21 LAB — CBC WITH DIFFERENTIAL/PLATELET
Abs Immature Granulocytes: 0.03 10*3/uL (ref 0.00–0.07)
Basophils Absolute: 0.1 10*3/uL (ref 0.0–0.1)
Basophils Relative: 1 %
Eosinophils Absolute: 0.1 10*3/uL (ref 0.0–0.5)
Eosinophils Relative: 1 %
HCT: 39.7 % (ref 39.0–52.0)
Hemoglobin: 12.9 g/dL — ABNORMAL LOW (ref 13.0–17.0)
Immature Granulocytes: 0 %
Lymphocytes Relative: 21 %
Lymphs Abs: 2 10*3/uL (ref 0.7–4.0)
MCH: 30.9 pg (ref 26.0–34.0)
MCHC: 32.5 g/dL (ref 30.0–36.0)
MCV: 95.2 fL (ref 80.0–100.0)
Monocytes Absolute: 0.5 10*3/uL (ref 0.1–1.0)
Monocytes Relative: 5 %
Neutro Abs: 6.9 10*3/uL (ref 1.7–7.7)
Neutrophils Relative %: 72 %
Platelets: 316 10*3/uL (ref 150–400)
RBC: 4.17 MIL/uL — ABNORMAL LOW (ref 4.22–5.81)
RDW: 15.3 % (ref 11.5–15.5)
WBC: 9.5 10*3/uL (ref 4.0–10.5)
nRBC: 0 % (ref 0.0–0.2)

## 2019-05-21 LAB — ETHANOL: Alcohol, Ethyl (B): 170 mg/dL — ABNORMAL HIGH (ref <10)

## 2019-05-21 MED ORDER — DIGOXIN 125 MCG PO TABS
125.0000 ug | ORAL_TABLET | Freq: Every day | ORAL | Status: DC
  Administered 2019-05-22 – 2019-05-23 (×2): 125 ug via ORAL
  Filled 2019-05-21 (×2): qty 1

## 2019-05-21 MED ORDER — LEVOTHYROXINE SODIUM 25 MCG PO TABS
25.0000 ug | ORAL_TABLET | Freq: Every day | ORAL | Status: DC
  Administered 2019-05-22: 25 ug via ORAL
  Filled 2019-05-21 (×2): qty 1

## 2019-05-21 MED ORDER — AMIODARONE HCL 200 MG PO TABS
200.0000 mg | ORAL_TABLET | Freq: Two times a day (BID) | ORAL | Status: DC
  Administered 2019-05-22 – 2019-05-23 (×4): 200 mg via ORAL
  Filled 2019-05-21 (×4): qty 1

## 2019-05-21 MED ORDER — TICAGRELOR 90 MG PO TABS
90.0000 mg | ORAL_TABLET | Freq: Two times a day (BID) | ORAL | Status: DC
  Administered 2019-05-22 – 2019-05-23 (×3): 90 mg via ORAL
  Filled 2019-05-21 (×3): qty 1

## 2019-05-21 MED ORDER — METOPROLOL SUCCINATE ER 50 MG PO TB24
50.0000 mg | ORAL_TABLET | Freq: Every day | ORAL | Status: DC
  Filled 2019-05-21: qty 2

## 2019-05-21 MED ORDER — ATORVASTATIN CALCIUM 80 MG PO TABS
80.0000 mg | ORAL_TABLET | Freq: Every day | ORAL | Status: DC
  Administered 2019-05-22 (×2): 80 mg via ORAL
  Filled 2019-05-21 (×2): qty 1

## 2019-05-21 NOTE — ED Provider Notes (Signed)
[facility] EMERGENCY DEPARTMENT Provider Note   CSN: [id number] Arrival date & time: 05/21/19  2153     History Chief Complaint  Patient presents with  . Fall    Daysean Tinkham III is a 74 y.o. male.  HPI    74 year old male presenting after he lost his balance and fell down when leaving [address] [employer].  He does admit to having "2 drinks."  Subsequently had additional falls though.  Patient initially declined evaluation but then finally consented once his wife was called.  He did strike his head but he denies headache or any pain elsewhere for that matter. He thinks he is on a blood thinner but is not sure specifically what.  States that he is otherwise been feeling fine for the past several days.  He works reports a past history of V. tach status post defibrillator.  He denies any palpitations.  Denies any dizziness or lightheadedness. Reports last tetanus 5-6 years ago.  No past medical history on file.  There are no problems to display for this patient.       No family history on file.  Social History   Tobacco Use  . Smoking status: Not on file  Substance Use Topics  . Alcohol use: Not on file  . Drug use: Not on file    Home Medications Prior to Admission medications   Not on File    Allergies    Patient has no allergy information on record.  Review of Systems   Review of Systems All systems reviewed and negative, other than as noted in HPI.  Physical Exam Updated Vital Signs BP 104/62   Pulse 68   Temp 97.9 F (36.6 C) (Oral)   Resp 15   Ht 6\' 2"  (1.88 m)   Wt 122.5 kg   SpO2 98%   BMI 34.67 kg/m   Physical Exam Vitals and nursing note reviewed.  Constitutional:      General: He is not in acute distress.    Appearance: He is well-developed.  HENT:     Head: Normocephalic.     Comments: Hematoma L forehead.  Eyes:     General:        Right eye: No discharge.        Left eye: No discharge.     Conjunctiva/sclera:  Conjunctivae normal.  Neck:     Comments: No midline spinal tenderness Cardiovascular:     Rate and Rhythm: Normal rate and regular rhythm.     Heart sounds: Normal heart sounds. No murmur. No friction rub. No gallop.   Pulmonary:     Effort: Pulmonary effort is normal. No respiratory distress.     Breath sounds: Normal breath sounds.  Abdominal:     General: There is no distension.     Palpations: Abdomen is soft.     Tenderness: There is no abdominal tenderness.  Musculoskeletal:     Cervical back: Neck supple.     Comments: Numerous scattered abrasion (R elbow, L knee, shins, forehead). No significant bony tenderness or significant pain with ROM of large joints.   Skin:    General: Skin is warm and dry.  Neurological:     General: No focal deficit present.     Mental Status: He is alert and oriented to person, place, and time.  Psychiatric:        Behavior: Behavior normal.        Thought Content: Thought content normal.     ED  Results / Procedures / Treatments   Labs (all labs ordered are listed, but only abnormal results are displayed) Labs Reviewed  CBC WITH DIFFERENTIAL/PLATELET - Abnormal; Notable for the following components:      Result Value   RBC 4.17 (*)    Hemoglobin 12.9 (*)    All other components within normal limits  BASIC METABOLIC PANEL - Abnormal; Notable for the following components:   Sodium 131 (*)    CO2 18 (*)    BUN 61 (*)    Creatinine, Ser 2.65 (*)    Calcium 8.5 (*)    GFR calc non Af Amer 22 (*)    GFR calc Af Amer 26 (*)    All other components within normal limits  ETHANOL - Abnormal; Notable for the following components:   Alcohol, Ethyl (B) 170 (*)    All other components within normal limits  SARS CORONAVIRUS 2 (TAT 6-24 HRS)  URINALYSIS, ROUTINE W REFLEX MICROSCOPIC    EKG EKG Interpretation  Date/Time:  Sunday May 21 2019 22:17:15 EDT Ventricular Rate:  66 PR Interval:    QRS Duration: 226 QT Interval:  581 QTC  Calculation: 609 R Axis:   -142 Text Interpretation: Sinus rhythm with first degree AV block Left bundle branch block Confirmed by ,  ([id number]) on 05/21/2019 11:45:04 PM   Radiology CT Head Wo Contrast  Result Date: 05/21/2019 CLINICAL DATA:  Fall while on anti coagulation EXAM: CT HEAD WITHOUT CONTRAST TECHNIQUE: Contiguous axial images were obtained from the base of the skull through the vertex without intravenous contrast. COMPARISON:  None. FINDINGS: Brain: There is no mass, hemorrhage or extra-axial collection. There is generalized atrophy without lobar predilection. Hypodensity of the white matter is most commonly associated with chronic microvascular disease. Vascular: No abnormal hyperdensity of the major intracranial arteries or dural venous sinuses. No intracranial atherosclerosis. Skull: Left frontal scalp hematoma.  No skull fracture. Sinuses/Orbits: No fluid levels or advanced mucosal thickening of the visualized paranasal sinuses. No mastoid or middle ear effusion. The orbits are normal. IMPRESSION: 1. No acute intracranial abnormality. 2. Left frontal scalp hematoma without skull fracture. Electronically Signed   By: Kevin  Herman M.D.   On: 05/21/2019 22:35    Procedures Procedures (including critical care time)  Medications Ordered in ED Medications - No data to display  ED Course  I have reviewed the triage vital signs and the nursing notes.  Pertinent labs & imaging results that were available during my care of the patient were reviewed by me and considered in my medical decision making (see chart for details).    MDM Rules/Calculators/A&P                      74 yM s/p falls. Says mechanical when he lost his balance. He did have a couple drinks tonight. Hematoma to L frontal region and some superficial abrasions. He denies any pain though. Head CT with no significant findings aside from the hematoma.   Blood pressure soft. He says he runs low. He thinks 92T  systolic. I cannot readily confirm this though with review of records.    Renal function noted. I am not sure of chronicity of this though. He says his cardiologists are Dr Caryl Comes and Ellyn Hack but I don't have any prior records to review. BUN:Cr would suggest some component of pre-renal and hypotension would support this as well. Giving IVF. Ethanol 170.  With multiple falls, hypotension, renal impairment will admit for ongoing hydration.  Final Clinical Impression(s) / ED Diagnoses Final diagnoses:  Fall, initial encounter  Multiple abrasions  Hypotension, unspecified hypotension type  Renal impairment    Rx / DC Orders ED Discharge Orders    None       Virgel Manifold, MD 05/23/19 2108

## 2019-05-21 NOTE — Consult Note (Signed)
Responded to page, pt unavailable, no family present, staff will page again if further chaplain services needed.  Rev. Eloise Levels Chaplain

## 2019-05-21 NOTE — ED Notes (Signed)
Informed provider of bp, verbal order for 1L NS

## 2019-05-21 NOTE — ED Notes (Signed)
RN and pt to CT 

## 2019-05-21 NOTE — ED Notes (Signed)
Back from CT

## 2019-05-21 NOTE — ED Triage Notes (Signed)
Per EMS, pt from home alone (wife at beach), pt had multiple falls on thinners today.  Pt fell at a park, restaurant and at home.  Took three EMS to get him up w/ a walker and fell again.  Pt has hematoma to left forehead.  Only came when wife was called, [name] [phone].

## 2019-05-21 NOTE — H&P (Signed)
History and Physical    Leonard [name] SWN:[id number] DOB: 05-29-1945 DOA: (Not on file)  PCP: Girtha Rm, NP-C  Patient coming from: Home  I have personally briefly reviewed patient's old medical records in [place]  Chief Complaint: Multiple falls  HPI: Leonard [name] is a 74 y.o. male with medical history significant of CAD, ICM, HFrEF with EF 15-20% on most recent studies s/p AICD.  Chronic hypotension, BP 92/70 at most recent 04/12/19 office visit with cards though he was asymptomatic at that time, CKD stage 3-4 with baseline creat of 2.1 as of earlier this year.  Pt presents to the ED after multiple falls this evening.  Hit forehead during one of the falls.  Admits to having "2 drinks".  Initially declined evaluation but then allowed it after his wife was called.  Denies headache or pain elsewhere.   ED Course: BP initially 93G systolic, only had 182XH NS before I stopped fluids but BP already up to 371-696 systolic.  (as noted above his pressure usually runs 78L systolic at baseline it seems).  Creat 2.6 today  EDP wants obs admit.   Review of Systems: As per HPI, otherwise all review of systems negative.  Past Medical History:  Diagnosis Date  . AICD (automatic cardioverter/defibrillator) present 08/26/2015   STJ  . Anxiety   . Atherosclerotic heart disease of native coronary artery with angina pectoris ([place]) 07/2015   a. 07/2015 Posterior STEMI/PCI: LM nl, LAd 40ost, RI 40, RCA 155m (3.0x22 Resolute Integrity DES distal, 3.0x12 Resolute Integrity DES prox), 90d (PTCA), RPDA small, nl, RPLB2 100 - too small for PTCA. -- patent in f/u cath 08/2015 & 05/2016 - patent RCA stents (~10%), RI 40%, LM ~30% ost LAD ~40%.  . Chronic combined systolic and diastolic CHF (congestive heart failure) ([place]) 07/2015   a. 07/2015 Ehco: EF 45-50%, basal-mid inf and infsept AK, inflat, apical inf, and apical septal HK, Gr1 DD, triv MR.;; b - Echo 05/2016 -> EF 20-25%, Gr 2 DD.   Severe diffuse HK disproportionately severe HK and thinning of apex, anterior septum and anterior wall-with septal dyssynchrony.  PAP ~53 mmHg.  [name] Hyperlipidemia with target low density lipoprotein (LDL) cholesterol less than 70 mg/dL   . Hypertensive heart disease   . Ischemic cardiomyopathy 07/2015 - 05/2016   a. 07/2015 Echo: EF 45-50%.; Follow-up Echo 08/23/2015: EF 50-55% with mild inferior hypokinesis --> 05/2016: EF 20-25%, diffuse HK. PAP ~53 mmHg.  . Kidney disease, chronic, stage [name] (GFR 30-59 ml/min) 08/12/2015  . Morbid obesity ([place])   . ST elevation myocardial infarction (STEMI) of inferior wall ([place]) 08/12/2015   Subacute presentation for inferior MI with post infarction angina on the following day. EKG still shows injury current. Therefore decided to proceed with intervention of 100% mRCA thrombotic lesion.  . Sustained ventricular fibrillation ([place]) 08/22/2015   Underwent EP evaluation with final ICD implantation.    Past Surgical History:  Procedure Laterality Date  . CARDIAC CATHETERIZATION N/A 08/12/2015   Procedure: Left Heart Cath and Coronary Angiography;  Surgeon: Leonie Man, MD;  Location: [facility];  Service: Cardiovascular: Inferior STEMI: 100% mid RCA, 90% distal RCA, 100% RPL. 40% ostial and proximal LAD and branch of ramus.  [name] CARDIAC CATHETERIZATION N/A 08/26/2015   Procedure: Left Heart Cath and Coronary Angiography;  Surgeon: Belva Crome, MD;  Location: [facility];  Service: Cardiovascular: To evaluate sustained VT. Widely patent RCA stent and distal PTCA site.  Also patent RPL branch. LAD lesion appeared to be more consistent with 50-55% and 40%. Otherwise stable from previous cath. EF estimated 35-45%.  [name] CARDIOVERSION N/A 08/23/2015   Procedure: CARDIOVERSION;  Surgeon: Deboraha Sprang, MD;  Location: [facility];  Service: Cardiovascular;  Laterality: N/A;  . CORONARY ANGIOPLASTY WITH STENT PLACEMENT  08/12/2015   Mid RCA 100% reduced to 0% with 2  overlapping Resolute DES.  3.0 x 22 mm with a 3.0 x 12 mm prox overlarp (postdilated to 3.4 mm); PTCA of dRCA 90%. (Very dificult,complex case - tortuous, [place] RCA - unable to advance longer stents.  RPL2 noted to have thromboembolic 989% occlusion - unable to reach.  . ELECTROPHYSIOLOGIC STUDY N/A 08/26/2015   Procedure: Electrophysiology Study;  Surgeon: Deboraha Sprang, MD;  Location: [facility];  Service: Cardiovascular;  Laterality: N/A;  . EP IMPLANTABLE DEVICE N/A 08/26/2015   Procedure: ICD Implant;  Surgeon: Deboraha Sprang, MD;  Location: [facility];  Service: Cardiovascular;  Laterality: LEFT:  St Jude ICD, serial number [id number].   [name] HEMORRHOID SURGERY    . RIGHT/LEFT HEART CATH AND CORONARY ANGIOGRAPHY N/A 06/10/2016   Procedure: Right/Left Heart Cath and Coronary Angiography;  Surgeon: Martinique, Peter M, MD;  [facility]: [facility]:  Nonobstructive CAD. RCA stents PATENT.  Ost-Prox RCA, 10 %. Ost LM 30 %. Ost LAD 40 %. RI, 40 %; High PCWP  & LVEDP (23 mmHg) ; PAP 43/23/31 mmHg. CO/CI 3.89 / 1/5 (Severely Reduced)  . SKIN SURGERY    . TRANSTHORACIC ECHOCARDIOGRAM  08/13/2015; 08/23/2015   a. Mild concentric LVH. EF 45-50%. Basal-mid inferior and inferoseptal akinesis with hypokinesis of inferolateral and apical inferior wall. 1 DD. ;; b. Technically difficult study. Definity contrast administered.  Compared to a prior echo in 07/2015, the LVEF is slightly higher at 50-55% with mild inferior hypokinesis.  . TRANSTHORACIC ECHOCARDIOGRAM  06/09/2016   EF  20-25%, GR 2 DD. Severe diffuse  hypokinesis with distinct regional wall motion abnormalities.   There is disproportionately severe hypokinesis and thinning of the apex, anterior septum and anterior wall (septal dyssynchrony). However, there is severe dyssynchrony , making it difficult to assess regional function.  PA P ~53 mmHg  . TRANSTHORACIC ECHOCARDIOGRAM  01/2017   Severely reduced EF.  GI to DD.  Moderate MR.   Mild to have a moderately elevated PAP (peak 63mmHg)     reports that he has never smoked. He has never used smokeless tobacco. He reports current alcohol use of about 3.0 - 4.0 standard drinks of alcohol per week. He reports that he does not use drugs.  Allergies  Allergen Reactions  . Clams [Shellfish Allergy] Shortness Of Breath, Itching and Swelling  . Shellfish-Derived Products Shortness Of Breath, Itching and Swelling  . Other Itching and Other (See Comments)    Ragweed causes watery eyes, itching, sneezing  . Sulfa Antibiotics Other (See Comments)    Pt unsure of reaction, mother told him he was allergic     Family History  Problem Relation Age of Onset  . Diabetes Mother   . Heart disease Paternal Grandmother   . Heart disease Paternal Grandfather      Prior to Admission medications   Medication Sig Start Date End Date Taking? Authorizing Provider  acetaminophen (TYLENOL) 325 MG tablet Take 2 tablets (650 mg total) by mouth every 4 (four) hours as needed for headache or mild pain. 12/30/18   Angiulli, Lavon Paganini, PA-C  amiodarone (PACERONE) 200 MG tablet Take 1.5 tablets (300 mg total) by mouth daily. 04/12/19   Deboraha Sprang, MD  aspirin EC 81 MG tablet Take 1 tablet (81 mg total) by mouth daily. 08/14/15   Arbutus Leas, NP  atorvastatin (LIPITOR) 80 MG tablet TAKE 1 TABLET BY MOUTH DAILY AT 6 PM; MAKE ANNUAL APPOINTMENT WITH DOCTOR KLEIN FOR FUTURE REFILLS 12/30/18   Angiulli, Lavon Paganini, PA-C  Cholecalciferol (VITAMIN D) 50 MCG (2000 UT) CAPS Take 0.0005 capsules (1 Units total) by mouth daily. 12/30/18   Angiulli, Lavon Paganini, PA-C  Coenzyme Q10 (COQ10) 200 MG CAPS Take 200 mg by mouth daily.    [provider]  digoxin (LANOXIN) 0.125 MG tablet Take 125 mcg by mouth daily. 05/17/19   [provider]  ENTRESTO 49-51 MG Take 1 tablet by mouth 2 (two) times daily. 01/03/19   [provider]  FLUoxetine (PROZAC) 10 MG capsule Take 1 capsule (10 mg total)  by mouth daily. 12/30/18   Angiulli, Lavon Paganini, PA-C  furosemide (LASIX) 40 MG tablet Take 40 mg by mouth.    [provider]  hydrocerin (EUCERIN) CREA Apply 1 application topically 2 (two) times daily. 12/30/18   Angiulli, Lavon Paganini, PA-C  levothyroxine (SYNTHROID) 25 MCG tablet Take 1 tablet (25 mcg total) by mouth daily before breakfast. 04/13/19   Deboraha Sprang, MD  MAGNESIUM-OXIDE 400 (241.3 Mg) MG tablet Take 1 tablet by mouth 2 (two) times daily. 02/22/19   [provider]  metoprolol succinate (TOPROL-XL) 50 MG 24 hr tablet Take 1 tablet (50 mg total) by mouth daily. 04/19/19   Leonie Man, MD  spironolactone (ALDACTONE) 25 MG tablet Take 12.5 mg by mouth daily. Take 1/2 tablet daily 02/22/19   [provider]  ticagrelor (BRILINTA) 90 MG TABS tablet TAKE 1 TABLET BY MOUTH TWICE DAILY(NEEDS OFFICE VISIT FOR REFILLS) 12/30/18   Angiulli, Lavon Paganini, PA-C  torsemide (DEMADEX) 20 MG tablet Take 1 tablet (20 mg total) by mouth daily. Patient taking differently: Take 20 mg by mouth daily. Take two tablets daily. 12/31/18   Angiulli, Lavon Paganini, PA-C  venlafaxine (EFFEXOR) 75 MG tablet Take 75 mg by mouth daily. 02/22/19   [provider]    Physical Exam: There were no vitals filed for this visit.  Constitutional: NAD, calm, comfortable Eyes: PERRL, lids and conjunctivae normal ENMT: Patient with hematoma to forehead Neck: normal, supple, no masses, no thyromegaly Respiratory: clear to auscultation bilaterally, no wheezing, no crackles. Normal respiratory effort. No accessory muscle use.  Cardiovascular: Regular rate and rhythm, no murmurs / rubs / gallops. 2+ BLE edema which patient says is baseline. 2+ pedal pulses. No carotid bruits.  Abdomen: no tenderness, no masses palpated. No hepatosplenomegaly. Bowel sounds positive.  Musculoskeletal: no clubbing / cyanosis. No joint deformity upper and lower extremities. Good ROM, no contractures. Normal muscle tone.    Skin: Bruising and dried blood to LLE Neurologic: CN 2-12 grossly intact. Sensation intact, DTR normal. Strength 5/5 in all 4.  Psychiatric: Normal judgment and insight. Alert and oriented x 3. Normal mood.    Labs on Admission: I have personally reviewed following labs and imaging studies  CBC: No results for input(s): WBC, NEUTROABS, HGB, HCT, MCV, PLT in the last 168 hours. Basic Metabolic Panel: No results for input(s): NA, K, CL, CO2, GLUCOSE, BUN, CREATININE, CALCIUM, MG, PHOS in the last 168 hours. GFR: CrCl cannot be calculated (Patient's most recent lab result is older than  the maximum 21 days allowed.). Liver Function Tests: No results for input(s): AST, ALT, ALKPHOS, BILITOT, PROT, ALBUMIN in the last 168 hours. No results for input(s): LIPASE, AMYLASE in the last 168 hours. No results for input(s): AMMONIA in the last 168 hours. Coagulation Profile: No results for input(s): INR, PROTIME in the last 168 hours. Cardiac Enzymes: No results for input(s): CKTOTAL, CKMB, CKMBINDEX, TROPONINI in the last 168 hours. BNP (last 3 results) No results for input(s): PROBNP in the last 8760 hours. HbA1C: No results for input(s): HGBA1C in the last 72 hours. CBG: No results for input(s): GLUCAP in the last 168 hours. Lipid Profile: No results for input(s): CHOL, HDL, LDLCALC, TRIG, CHOLHDL, LDLDIRECT in the last 72 hours. Thyroid Function Tests: No results for input(s): TSH, T4TOTAL, FREET4, T3FREE, THYROIDAB in the last 72 hours. Anemia Panel: No results for input(s): VITAMINB12, FOLATE, FERRITIN, TIBC, IRON, RETICCTPCT in the last 72 hours. Urine analysis:    Component Value Date/Time   COLORURINE YELLOW 12/17/2018 2021   APPEARANCEUR CLEAR 12/17/2018 2021   LABSPEC 1.013 12/17/2018 2021   PHURINE 6.0 12/17/2018 2021   GLUCOSEU NEGATIVE 12/17/2018 2021   HGBUR SMALL (A) 12/17/2018 2021   BILIRUBINUR NEGATIVE 12/17/2018 2021   BILIRUBINUR n 04/18/2015 1106   [place] 12/17/2018 2021   PROTEINUR NEGATIVE 12/17/2018 2021   UROBILINOGEN negative 04/18/2015 1106   NITRITE NEGATIVE 12/17/2018 2021   LEUKOCYTESUR NEGATIVE 12/17/2018 2021    Radiological Exams on Admission: No results found.  EKG: Independently reviewed.  Assessment/Plan Patient Active Problem List   Diagnosis Date Noted  . Hypotension 05/21/2019  . Acute alcohol intoxication ([place]) 05/21/2019  . HFrEF (heart failure with reduced ejection fraction) ([place]) 05/21/2019  . CKD (chronic kidney disease) stage 3, GFR 30-59 ml/min 05/21/2019  . Ischemic cardiomyopathy 05/21/2019  . CAD (coronary artery disease) 05/21/2019      1. Hypotension and falls - 1. Possibly mild dehydraton associated with EtOH intake.  Or just falls due to EtOH intoxication 2. EtOH level in ED is 170, pt not a chronic alcoholic by history 3. Will continue to "gently hydrate" patient by simply holding his lasix and aldactone tomorrow.  Want to avoid further IVF for the moment given CHF history. 4. Tele monitor 5. Hold entresto for tomorrow as well. 2. HFrEF due to ICM from CAD - EF 15-20%, s/p AICD 1. Cont ASA and brilinta 2. Holding entresto and diuretics for the moment, decide wether to just restart these tomorrow AM depending on how pt does 3. CKD stage 3 - 1. Creat 2.6 today slightly up from 2.1 baseline 2. 200cc IVF as above 3. Holding diuretics and entresto as above 4. Repeat BMP in AM  DVT prophylaxis: cont home ASA/Brilinta Code Status: Full Family Communication: No family in room Disposition Plan: Home after able to get up and ambulate, possibly later today Consults called: None Admission status: Place in obs   Sashia Campas, [place] [facility]  How to contact the [facility] Attending or Consulting provider [place] or covering provider during after hours [place], for this patient?  1. Check the care team in [facility] and look for a) attending/consulting TRH provider listed and b) the [facility] team  listed 2. Log into [email or web address]  Amion Physician Scheduling and messaging for groups and whole hospitals  On call and physician scheduling software for group practices, residents, hospitalists and other medical providers for call, clinic, rotation and shift schedules. OnCall [employer] is a hospital-wide system for  scheduling doctors and paging doctors on call. EasyPlot is for scientific plotting and data analysis.  [email or web address]  and use [place]'s universal password to access. If you do not have the password, please contact the hospital operator.  3. Locate the [facility] provider you are looking for under [facility] and page to a number that you can be directly reached. 4. If you still have difficulty reaching the provider, please page the [facility] (Director on Call) for the Hospitalists listed on amion for assistance.  05/21/2019, 11:42 PM

## 2019-05-22 ENCOUNTER — Other Ambulatory Visit: Payer: Self-pay | Admitting: *Deleted

## 2019-05-22 ENCOUNTER — Telehealth: Payer: Self-pay | Admitting: Internal Medicine

## 2019-05-22 DIAGNOSIS — R9431 Abnormal electrocardiogram [ECG] [EKG]: Secondary | ICD-10-CM | POA: Diagnosis not present

## 2019-05-22 DIAGNOSIS — F10929 Alcohol use, unspecified with intoxication, unspecified: Secondary | ICD-10-CM | POA: Diagnosis not present

## 2019-05-22 DIAGNOSIS — I959 Hypotension, unspecified: Secondary | ICD-10-CM | POA: Diagnosis not present

## 2019-05-22 DIAGNOSIS — I255 Ischemic cardiomyopathy: Secondary | ICD-10-CM | POA: Diagnosis not present

## 2019-05-22 DIAGNOSIS — L03119 Cellulitis of unspecified part of limb: Secondary | ICD-10-CM

## 2019-05-22 DIAGNOSIS — N183 Chronic kidney disease, stage 3 unspecified: Secondary | ICD-10-CM | POA: Diagnosis not present

## 2019-05-22 DIAGNOSIS — S0081XA Abrasion of other part of head, initial encounter: Secondary | ICD-10-CM | POA: Diagnosis not present

## 2019-05-22 DIAGNOSIS — I951 Orthostatic hypotension: Secondary | ICD-10-CM | POA: Diagnosis not present

## 2019-05-22 DIAGNOSIS — N289 Disorder of kidney and ureter, unspecified: Secondary | ICD-10-CM | POA: Diagnosis not present

## 2019-05-22 LAB — CBC
HCT: 39.4 % (ref 39.0–52.0)
Hemoglobin: 13 g/dL (ref 13.0–17.0)
MCH: 31.6 pg (ref 26.0–34.0)
MCHC: 33 g/dL (ref 30.0–36.0)
MCV: 95.6 fL (ref 80.0–100.0)
Platelets: 303 10*3/uL (ref 150–400)
RBC: 4.12 MIL/uL — ABNORMAL LOW (ref 4.22–5.81)
RDW: 15.2 % (ref 11.5–15.5)
WBC: 9.6 10*3/uL (ref 4.0–10.5)
nRBC: 0 % (ref 0.0–0.2)

## 2019-05-22 LAB — URINALYSIS, ROUTINE W REFLEX MICROSCOPIC
Bilirubin Urine: NEGATIVE
Glucose, UA: NEGATIVE mg/dL
Hgb urine dipstick: NEGATIVE
Ketones, ur: NEGATIVE mg/dL
Leukocytes,Ua: NEGATIVE
Nitrite: NEGATIVE
Protein, ur: NEGATIVE mg/dL
Specific Gravity, Urine: 1.005 (ref 1.005–1.030)
pH: 6 (ref 5.0–8.0)

## 2019-05-22 LAB — BASIC METABOLIC PANEL
Anion gap: 15 (ref 5–15)
BUN: 58 mg/dL — ABNORMAL HIGH (ref 8–23)
CO2: 20 mmol/L — ABNORMAL LOW (ref 22–32)
Calcium: 8.7 mg/dL — ABNORMAL LOW (ref 8.9–10.3)
Chloride: 99 mmol/L (ref 98–111)
Creatinine, Ser: 2.41 mg/dL — ABNORMAL HIGH (ref 0.61–1.24)
GFR calc Af Amer: 29 mL/min — ABNORMAL LOW (ref >60)
GFR calc non Af Amer: 25 mL/min — ABNORMAL LOW (ref >60)
Glucose, Bld: 74 mg/dL (ref 70–99)
Potassium: 3.8 mmol/L (ref 3.5–5.1)
Sodium: 134 mmol/L — ABNORMAL LOW (ref 135–145)

## 2019-05-22 LAB — MAGNESIUM: Magnesium: 2.2 mg/dL (ref 1.7–2.4)

## 2019-05-22 LAB — TSH: TSH: 13.66 u[IU]/mL — ABNORMAL HIGH (ref 0.350–4.500)

## 2019-05-22 LAB — PHOSPHORUS: Phosphorus: 4.5 mg/dL (ref 2.5–4.6)

## 2019-05-22 LAB — SARS CORONAVIRUS 2 (TAT 6-24 HRS): SARS Coronavirus 2: NEGATIVE

## 2019-05-22 MED ORDER — THIAMINE HCL 100 MG PO TABS
100.0000 mg | ORAL_TABLET | Freq: Every day | ORAL | Status: DC
  Administered 2019-05-22 – 2019-05-23 (×2): 100 mg via ORAL
  Filled 2019-05-22 (×3): qty 1

## 2019-05-22 MED ORDER — THIAMINE HCL 100 MG/ML IJ SOLN: 100.0000 mg | Freq: Every day | INTRAMUSCULAR | Status: DC

## 2019-05-22 MED ORDER — ACETAMINOPHEN 650 MG RE SUPP: 650.0000 mg | Freq: Four times a day (QID) | RECTAL | Status: DC | PRN

## 2019-05-22 MED ORDER — ACETAMINOPHEN 325 MG PO TABS: 650.0000 mg | ORAL_TABLET | Freq: Four times a day (QID) | ORAL | Status: DC | PRN

## 2019-05-22 MED ORDER — LORAZEPAM 2 MG/ML IJ SOLN: 1.0000 mg | INTRAMUSCULAR | Status: DC | PRN

## 2019-05-22 MED ORDER — LORAZEPAM 1 MG PO TABS
1.0000 mg | ORAL_TABLET | ORAL | Status: DC | PRN
  Filled 2019-05-22: qty 1

## 2019-05-22 MED ORDER — ONDANSETRON HCL 4 MG PO TABS: 4.0000 mg | ORAL_TABLET | Freq: Four times a day (QID) | ORAL | Status: DC | PRN

## 2019-05-22 MED ORDER — ONDANSETRON HCL 4 MG/2ML IJ SOLN: 4.0000 mg | Freq: Four times a day (QID) | INTRAMUSCULAR | Status: DC | PRN

## 2019-05-22 MED ORDER — FOLIC ACID 1 MG PO TABS
1.0000 mg | ORAL_TABLET | Freq: Every day | ORAL | Status: DC
  Administered 2019-05-22 – 2019-05-23 (×2): 1 mg via ORAL
  Filled 2019-05-22 (×2): qty 1

## 2019-05-22 MED ORDER — ADULT MULTIVITAMIN W/MINERALS CH
1.0000 | ORAL_TABLET | Freq: Every day | ORAL | Status: DC
  Administered 2019-05-22 – 2019-05-23 (×2): 1 via ORAL
  Filled 2019-05-22 (×2): qty 1

## 2019-05-22 MED ORDER — ENOXAPARIN SODIUM 40 MG/0.4ML ~~LOC~~ SOLN: 40.0000 mg | SUBCUTANEOUS | Status: DC

## 2019-05-22 NOTE — Evaluation (Signed)
Occupational Therapy Evaluation Patient Details Name: Leonard [name] MRN: [id number] DOB: [birth year] Today's Date: 05/22/2019    History of present illness This 74 y.o. male arrived in ED after suffering multiple falls.   PMH includes: CAD, ICM, EF 15-20%, chronic hypotension, CKD stage 3-4, anxiety, h/o STEMI.  Dx: possible dehydration due ETOH intake, or falls secondary to ETOH intoxication   OT comments  Pt presented with the above diagnosis and demonstrates the below listed deficits.  He is mildly unsteady up on his feet, using RW and requires min guard assist.  ADLs were simulated and he requires set up to min A.  He reports he lives with his wife, who currently is on his way back from the beach, and will be able to provide 24 hour assist/supervision.   Pt is very guarded with conversation and activity, and is adamant he is fine, and wants to discharge home.  Discussed home therapies with him, and he is not interested, stating his unsteadiness is due to lying in the bed for several hours.  Recommend tub transfer bench, but he doesn't feel this is necessary.   Pt is at risk for further falls, but is not receptive to further, or follow therapies. Recommend 24 hour assist, and that he use RW.   OT will sign off at this time.   Follow Up Recommendations  No OT follow up;Supervision/Assistance - 24 hour    Equipment Recommendations  None recommended by OT    Recommendations for Other Services      Precautions / Restrictions Precautions Precautions: Fall Precaution Comments: h/o falls        Mobility Bed Mobility Overal bed mobility: Needs Assistance Bed Mobility: Supine to Sit;Sit to Supine     Supine to sit: Min guard Sit to supine: Min guard   General bed mobility comments: Pt requires min guard for safety to move on and off ED stretcher   Transfers Overall transfer level: Needs assistance Equipment used: Rolling walker (2 wheeled) Transfers: Sit to/from [employer] Sit to Stand: Min guard Stand pivot transfers: Min guard       General transfer comment: Min guard assist for balance and safety as he is a bit unsteady.  Pt insists this is because he has been lying down for several hours, and that he is perfectly fine     Balance                                           ADL either performed or assessed with clinical judgement   ADL Overall ADL's : Needs assistance/impaired Eating/Feeding: Independent   Grooming: Wash/dry hands;Wash/dry face;Oral care;Brushing hair;Min guard;Standing   Upper Body Bathing: Set up;Sitting   Lower Body Bathing: Minimal assistance;Sit to/from stand   Upper Body Dressing : Set up;Sitting   Lower Body Dressing: Minimal assistance;Sit to/from stand Lower Body Dressing Details (indicate cue type and reason): Pt reports he does not wear socks at home.   Toilet Transfer: Min guard;Ambulation;Comfort height toilet;Grab bars;RW   Toileting- Water quality scientist and Hygiene: Min guard;Sit to/from Nurse, children's Details (indicate cue type and reason): discussed Tub transfer bench with pt, but he reports he does just fine with showering and tub transfers  Functional mobility during ADLs: Min guard;Rolling walker       Vision       Perception  Praxis      Cognition Arousal/Alertness: Awake/alert Behavior During Therapy: WFL for tasks assessed/performed Overall Cognitive Status: No family/caregiver present to determine baseline cognitive functioning                                 General Comments: Pt demonstrates decreased safety awareness.  He also asks x 2 if our RW is his.  cognition very difficult to assess as pt very guarded and controls the conversation         Exercises     Shoulder Instructions       General Comments VSS during session.  Discussed possible home therapies, but pt doesn't feel this is necessary.     Pertinent Vitals/  Pain       Pain Assessment: Faces Faces Pain Scale: No hurt  Home Living Family/patient expects to be discharged to:: Private residence Living Arrangements: Spouse/significant other Available Help at Discharge: Family;Available 24 hours/day Type of Home: House Home Access: Stairs to enter [employer] of Steps: 5-6 Entrance Stairs-Rails: Right;Left;Can reach both Home Layout: One level     Bathroom Shower/Tub: Tub/shower unit;Curtain   Biochemist, clinical: Standard     Home Equipment: Environmental consultant - 2 wheels;Cane - single point   Additional Comments: Pt reports wife has been at the beach, but is on her way home, and will be able to provide assist as needed at discharge       Prior Functioning/Environment Level of Independence: Independent with assistive device(s)        Comments: Pt insists he was fully independent PTA ambulating with either SPC or RW, and performing ADLs mod I.  He reports he is able to step over tub without difficulty, and that he is able to ambulate through the grocery store without difficulty.  He insists he only fell once, due to feeding the cat, and has had no other falls in the past 6 mos.  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    Frequency           Progress Toward Goals  OT Goals(current goals can now be found in the care plan section)     Acute Rehab OT Goals Patient Stated Goal: to go home now  OT Goal Formulation: All assessment and education complete, DC therapy  Plan      Co-evaluation                 AM-PAC OT "6 Clicks" Daily Activity     Outcome Measure   Help from another person eating meals?: None Help from another person taking care of personal grooming?: A Little Help from another person toileting, which includes using toliet, bedpan, or urinal?: A Little Help from another person bathing (including washing, rinsing, drying)?: A  Little Help from another person to put on and taking off regular upper body clothing?: A Little Help from another person to put on and taking off regular lower body clothing?: A Little 6 Click Score: 19    End of Session Equipment Utilized During Treatment: Gait belt;Rolling walker  OT Visit Diagnosis: Unsteadiness on feet (R26.81)   Activity Tolerance Patient tolerated treatment well   Patient Left in bed;with call bell/phone within reach   Nurse Communication Mobility status        Time: 4709-6283 OT Time Calculation (min): 38 min  Charges:  OT General Charges $OT Visit: 1 Visit OT Evaluation $OT Eval Moderate Complexity: 1 Mod OT Treatments $Therapeutic Activity: 23-37 mins  Nilsa Nutting., OTR/L Acute Rehabilitation Services Pager [phone] Office [phone]    Lucille Passy M 05/22/2019, 12:25 PM

## 2019-05-22 NOTE — Progress Notes (Addendum)
PROGRESS NOTE  Leonard [name] ENI:[id number] DOB: November 06, 1945 DOA: 05/21/2019 PCP: Girtha Rm, NP-C  HPI/Recap of past 24 hours: Leonard [name] is a 74 y.o. male with medical history significant of CAD, ICM, HFrEF with EF 15-20% on most recent studies s/p AICD, CKD stage 4 who presented to the ED after multiple falls.  Hit forehead during one of the falls.  Admits to having "2 drinks".  Initially declined evaluation but then allowed it after his wife was called.  Denies headache or pain elsewhere.  ED Course: BP initially 61W systolic, had 431VQ NS before it was stopped, responded well with SBP 100-110.   CT head no acute intracranial abnormality, left frontal scalp hematoma without skull fracture.  05/22/19: Seen and examined.  States that he tripped on his walker while trying to get into his car.  He denies any dizziness or cardiopulmonary symptoms.  Elevated Etoh level on presentation, states he has no intention to quit.  Cardiac medications on hold due to AKI and hypotension.  Will consult cardiology for adjustment of his cardiac medications.  Assessment/Plan: Principal Problem:   Acute alcohol intoxication ([place]) Active Problems:   Hypotension   HFrEF (heart failure with reduced ejection fraction) (HCC)   CKD (chronic kidney disease) stage 3, GFR 30-59 ml/min   Ischemic cardiomyopathy   CAD (coronary artery disease)  Fall, suspect multifactorial secondary to alcohol intoxication, orthostatic hypotension CT head no acute intracranial abnormalities, has left frontal scalp hematoma Obtain orthostatic vital signs Alcohol cessation counseling done at bedside, he has no intention to quit PT OT to assess Fall precautions  Hypotension, suspect iatrogenic in the setting of his heart failure medications Entresto, Lasix, spironolactone, and Toprol-XL held due to hypotension Maintain MAP greater than 65 Continue to closely monitor vital signs  Alcohol intoxication with concern  for withdrawal Presented with alcohol level 170 CIWA protocol in place Start multivitamin, thiamine and folate  Chronic systolic CHF EF 15 to 00% status post AICD/ischemic cardiomyopathy/coronary artery disease Denies any anginal symptoms Continue Brilinta Continue Lipitor Other cardiac medication include: Amiodarone, digoxin, not in Afib on 12 lead EKG reviewed x 2 (5/2 and 5/3).  QTC prolongation QTC greater than 600 Has AICD in place Avoid QTC prolonging agents Repeat twelve-lead EKG in the morning  CKD4 Presented with creatinine of 2.65 and GFR 22 Repeated creatinine 2.4 with GFR 25 Avoid nephrotoxins and hypotension  Hypothyroidism Obtain TSH Continue levothyroxine  Hyperlipidemia Continue Lipitor  Ambulatory dysfunction Recently started using a walker PT OT to assess Continue fall precautions    DVT prophylaxis:  SCDs Code Status: Full Family Communication: No family in room   Disposition Plan:  Patient is from home.  Anticipate discharge to home in the next 24 to 48 hours once his blood pressure is stable and cardiology signs off.    Consults called:  Cardiology.   Objective: Vitals:   05/22/19 1200 05/22/19 1300 05/22/19 1400 05/22/19 1420  BP: 107/63 114/62 106/68 107/70  Pulse: 76 79 69 74  Resp: (!) 22 18 19 16   Temp:    97.9 F (36.6 C)  TempSrc:    Oral  SpO2: 98% 99% 100% 100%  Weight:      Height:        Intake/Output Summary (Last 24 hours) at 05/22/2019 1450 Last data filed at 05/22/2019 1403 Gross per 24 hour  Intake --  Output 400 ml  Net -400 ml   Filed Weights   05/21/19 2204  Weight: 122.5  kg    Exam:  . General: 74 y.o. year-old male well developed well nourished in no acute distress.  Alert and oriented x3. . Cardiovascular: Regular rate and rhythm with no rubs or gallops.    [name] Respiratory: Clear to auscultation with no wheezes or rales. Good inspiratory effort. . Abdomen: Obese nontender nondistended with normal bowel  sounds. . Musculoskeletal: Lower extremity edema bilaterally . Psychiatry: Mood is appropriate for condition and setting   Data Reviewed: CBC: Recent Labs  Lab 05/21/19 2211 05/22/19 0500  WBC 9.5 9.6  NEUTROABS 6.9  --   HGB 12.9* 13.0  HCT 39.7 39.4  MCV 95.2 95.6  PLT 316 347   Basic Metabolic Panel: Recent Labs  Lab 05/21/19 2211 05/22/19 0500  NA 131* 134*  K 4.0 3.8  CL 99 99  CO2 18* 20*  GLUCOSE 90 74  BUN 61* 58*  CREATININE 2.65* 2.41*  CALCIUM 8.5* 8.7*  MG  --  2.2  PHOS  --  4.5   GFR: Estimated Creatinine Clearance: 36.3 mL/min (A) (by C-G formula based on SCr of 2.41 mg/dL (H)). Liver Function Tests: No results for input(s): AST, ALT, ALKPHOS, BILITOT, PROT, ALBUMIN in the last 168 hours. No results for input(s): LIPASE, AMYLASE in the last 168 hours. No results for input(s): AMMONIA in the last 168 hours. Coagulation Profile: No results for input(s): INR, PROTIME in the last 168 hours. Cardiac Enzymes: No results for input(s): CKTOTAL, CKMB, CKMBINDEX, TROPONINI in the last 168 hours. BNP (last 3 results) No results for input(s): PROBNP in the last 8760 hours. HbA1C: No results for input(s): HGBA1C in the last 72 hours. CBG: No results for input(s): GLUCAP in the last 168 hours. Lipid Profile: No results for input(s): CHOL, HDL, LDLCALC, TRIG, CHOLHDL, LDLDIRECT in the last 72 hours. Thyroid Function Tests: No results for input(s): TSH, T4TOTAL, FREET4, T3FREE, THYROIDAB in the last 72 hours. Anemia Panel: No results for input(s): VITAMINB12, FOLATE, FERRITIN, TIBC, IRON, RETICCTPCT in the last 72 hours. Urine analysis:    Component Value Date/Time   COLORURINE YELLOW 05/22/2019 0050   APPEARANCEUR CLEAR 05/22/2019 0050   LABSPEC 1.005 05/22/2019 0050   PHURINE 6.0 05/22/2019 0050   GLUCOSEU NEGATIVE 05/22/2019 0050   HGBUR NEGATIVE 05/22/2019 0050   BILIRUBINUR NEGATIVE 05/22/2019 0050   KETONESUR NEGATIVE 05/22/2019 0050   PROTEINUR  NEGATIVE 05/22/2019 0050   NITRITE NEGATIVE 05/22/2019 0050   LEUKOCYTESUR NEGATIVE 05/22/2019 0050   Sepsis Labs: @LABRCNTIP (procalcitonin:4,lacticidven:4)  ) Recent Results (from the past 240 hour(s))  SARS CORONAVIRUS 2 (TAT 6-24 HRS) Nasopharyngeal Nasopharyngeal Swab     Status: None   Collection Time: 05/22/19 12:50 AM   Specimen: Nasopharyngeal Swab  Result Value Ref Range Status   SARS Coronavirus 2 NEGATIVE NEGATIVE Final    Comment: (NOTE) SARS-CoV-2 target nucleic acids are NOT DETECTED. The SARS-CoV-2 RNA is generally detectable in upper and lower respiratory specimens during the acute phase of infection. Negative results do not preclude SARS-CoV-2 infection, do not rule out co-infections with other pathogens, and should not be used as the sole basis for treatment or other patient management decisions. Negative results must be combined with clinical observations, patient history, and epidemiological information. The expected result is Negative. Fact Sheet for Patients: SugarRoll.be Fact Sheet for Healthcare Providers: https://www.woods-mathews.com/ This test is not yet approved or cleared by the [place] FDA and  has been authorized for detection and/or diagnosis of SARS-CoV-2 by FDA under an Emergency Use Authorization (EUA). This  EUA will remain  in effect (meaning this test can be used) for the duration of the COVID-19 declaration under Section 56 4(b)(1) of the Act, 21 U.S.C. section 360bbb-3(b)(1), unless the authorization is terminated or revoked sooner. Performed at [facility], [place], [place], [place] [address]       Studies: CT Head Wo Contrast  Result Date: 05/21/2019 CLINICAL DATA:  Fall while on anti coagulation EXAM: CT HEAD WITHOUT CONTRAST TECHNIQUE: Contiguous axial images were obtained from the base of the skull through the vertex without intravenous contrast. COMPARISON:  None.  FINDINGS: Brain: There is no mass, hemorrhage or extra-axial collection. There is generalized atrophy without lobar predilection. Hypodensity of the white matter is most commonly associated with chronic microvascular disease. Vascular: No abnormal hyperdensity of the major intracranial arteries or dural venous sinuses. No intracranial atherosclerosis. Skull: Left frontal scalp hematoma.  No skull fracture. Sinuses/Orbits: No fluid levels or advanced mucosal thickening of the visualized paranasal sinuses. No mastoid or middle ear effusion. The orbits are normal. IMPRESSION: 1. No acute intracranial abnormality. 2. Left frontal scalp hematoma without skull fracture. Electronically Signed   By: Ulyses Jarred M.D.   On: 05/21/2019 22:35    Scheduled Meds: . amiodarone  200 mg Oral BID  . atorvastatin  80 mg Oral QHS  . digoxin  125 mcg Oral Daily  . folic acid  1 mg Oral Daily  . levothyroxine  25 mcg Oral Q0600  . metoprolol succinate  50 mg Oral Daily  . multivitamin with minerals  1 tablet Oral Daily  . thiamine  100 mg Oral Daily   Or  . thiamine  100 mg Intravenous Daily  . ticagrelor  90 mg Oral BID    Continuous Infusions:   LOS: 0 days     Kayleen Memos, MD [facility] Pager [phone]  If 7PM-7AM, please contact night-coverage [email or web address] Password [email or web address] 05/22/2019, 2:50 PM

## 2019-05-22 NOTE — Telephone Encounter (Signed)
Pt c/o swelling: STAT is pt has developed SOB within 24 hours  1) How much weight have you gained and in what time span? Believes so, but unsure how much  2) If swelling, where is the swelling located? Feet and ankles  3) Are you currently taking a fluid pill? yes  4) Are you currently SOB? no  5) Do you have a log of your daily weights (if so, list)? no  6) Have you gained 3 pounds in a day or 5 pounds in a week? Not sure  7) Have you traveled recently? No   Patient's wife calling stating the patient went to the ED yesterday, because he fell twice. She states they did a CT and the patient told her they did not find anything. She also states he went to his PCP for his swelling and they told him to wear compression stockings. She states his only other symprom is SOB when laying too flat.

## 2019-05-22 NOTE — Telephone Encounter (Signed)
Spoke with pt's wife who states she has been away for the weekend.  Pt has fallen twice and is currently being seen in the hospital.  States pt has had some swelling of his feet as well.  But unsure what is going on at this point.  Pt's wife states she  will have pt follow up with cardiology based on hospital recommendation.

## 2019-05-22 NOTE — Consult Note (Signed)
Cardiology Consultation:   Patient ID: Leonard [name] MRN: [id number]; DOB: 22-Aug-1945  Admit date: 05/21/2019 Date of Consult: 05/22/2019  Primary Care Provider: Girtha Rm, NP-C Primary Cardiologist:Dr Leonard [name] Primary Electrophysiologist:  Dr. Caryl [name]    Patient Profile:   Leonard [name] is a 74 y.o. male with a hx of CAD, ICM, HFrEF with EF 15-20% on recent studies s/p AICD who is being seen today for the evaluation of CHF at the request of Dr. [place] [name].  History of Present Illness:    PLEASE NOTE PT has 2 MRN and this is an empty chart not his usual chart.   [name] ischemic heart disease, history of CAD with PCI, to the right coronary artery, PTCA to RPDA.  Repeat cardiac catheterization revealed patent RCA stents on 05/2016., sustained wide-complex tachycardia, with EP testing confirming ventricular tachycardia, status post Leonard [name] ICD.[name]  Echocardiogram in 2018 revealing EF of 20% to 25% with grade 2 diastolic dysfunction with severe diffuse hypokinesis disproportionately severe hypokinesis and thinning of the apex anterior septum anterior wall and septal dyssynchrony with EPAP of 53 mmHg.  He was hospitalized on 02/23/2018 for acute on chronic systolic heart failure with biventricular failure.  He was diuresed and placed on p.o. torsemide, he was taken off of digoxin and Entresto due to elevated creatinine along with hypotension.  He was not placed on beta-blocker due to low output.  He was also treated for lower extremity cellulitis with Keflex.  He was to limit free water in the setting of hyponatremia.Discharge weight 265.9 (120.9 kg)  Other history includes hyperlipidemia, hypertensive heart disease, morbid obesity, chronic kidney disease stage [name], OSA.   Recnetly saw Dr. Caryl [name]   04/09/19 Date Cr K Hgb Mg  9/17  0.93 4.4  1.5  5/18  1.41 4.9     12/20 1.98 3.9<<3.4 15.5    DATE TEST EF   8/17  Cath  35-40 % Widely patent right coronary stents,Ostial  50-60% LAD  5/18   Echo  20-25 %   5/18  Cath  Findings unchanged   1/19 Echo 15-20%   11/20 Echo  15-20%       Was hospitalized 11-12/20 for refractory heart failure.  Required milrinone and IV diuretics.  Followed by CHF clinic.  Associated with a nearly 30 pound weight loss which has been stable.  Discussing CRT upgrade, they would like to defer until after Covid and on recent visit it was doing well with no SOB so held CRT for now.     EKG:  The EKG was personally reviewed and demonstrates:  SR with 1st degree AV block and LBBB no change from 04/12/19 Telemetry:  Telemetry was personally reviewed and demonstrates:  SR  Pt admitted 05/21/19 after multiple falls,  Hit his forehead during a fall. Admitted to 2 drinks of ETOH . BP in ER was in the 46E systolic and rec'd 703 cc NS.   BP increased Cr 2.6   His ICD has not been interrogated.    His meds have been held due to hypotension.  Entresto, and lasix and aldactone.  Cr was 2.65 up from baseline of 2.1   CT of head  IMPRESSION: 1. No acute intracranial abnormality. 2. Left frontal scalp hematoma without skull fracture.    Today Cr 2.41 Na 134 and K+ 3.8     No past medical history on file.  See above note, and see real chart please.  All histories reviewed in correct chart  Home Medications:  Prior to Admission medications   Medication Sig Start Date End Date Taking? Authorizing Provider  amiodarone (PACERONE) 200 MG tablet Take 200 mg by mouth 2 (two) times daily. 05/17/19  Yes [provider]  atorvastatin (LIPITOR) 80 MG tablet Take 80 mg by mouth at bedtime. 04/23/19  Yes [provider]  BRILINTA 90 MG TABS tablet Take 90 mg by mouth 2 (two) times daily. 02/26/19  Yes [provider]  digoxin (LANOXIN) 0.125 MG tablet Take 125 mcg by mouth daily. 05/17/19  Yes [provider]  ENTRESTO 49-51 MG Take 1 tablet by mouth 2 (two) times daily. 05/17/19  Yes [provider]    furosemide (LASIX) 40 MG tablet Take 40 mg by mouth daily. 05/17/19  Yes [provider]  levothyroxine (SYNTHROID) 25 MCG tablet Take 25 mcg by mouth daily. 04/15/19  Yes [provider]  metoprolol succinate (TOPROL-XL) 50 MG 24 hr tablet Take 50 mg by mouth daily. 05/17/19  Yes [provider]  spironolactone (ALDACTONE) 25 MG tablet Take 25 mg by mouth daily. 05/17/19  Yes [provider]    Inpatient Medications: Scheduled Meds: . amiodarone  200 mg Oral BID  . atorvastatin  80 mg Oral QHS  . digoxin  125 mcg Oral Daily  . folic acid  1 mg Oral Daily  . levothyroxine  25 mcg Oral Q0600  . metoprolol succinate  50 mg Oral Daily  . multivitamin with minerals  1 tablet Oral Daily  . thiamine  100 mg Oral Daily   Or  . thiamine  100 mg Intravenous Daily  . ticagrelor  90 mg Oral BID   Continuous Infusions:  PRN Meds: acetaminophen **OR** acetaminophen, LORazepam **OR** LORazepam, ondansetron **OR** ondansetron (ZOFRAN) IV  Allergies:    Allergies  Allergen Reactions  . Shellfish Allergy Anaphylaxis  . Sulfa Antibiotics Other (See Comments)    unknown    Social History:   Social History   Socioeconomic History  . Marital status: Single    Spouse name: Not on file  . Number of children: Not on file  . Years of education: Not on file  . Highest education level: Not on file  Occupational History  . Not on file  Tobacco Use  . Smoking status: Not on file  Substance and Sexual Activity  . Alcohol use: Not on file  . Drug use: Not on file  . Sexual activity: Not on file  Other Topics Concern  . Not on file  Social History Narrative  . Not on file   Social Determinants of Health   Financial Resource Strain:   . Difficulty of Paying Living Expenses:   Food Insecurity:   . Worried About Charity fundraiser in the Last Year:   . Arboriculturist in the Last Year:   Transportation Needs:   . Film/video editor (Medical):   [name]  Lack of Transportation (Non-Medical):   Physical Activity:   . Days of Exercise per Week:   . Minutes of Exercise per Session:   Stress:   . Feeling of Stress :   Social Connections:   . Frequency of Communication with Friends and Family:   . Frequency of Social Gatherings with Friends and Family:   . Attends Religious Services:   . Active Member of Clubs or Organizations:   . Attends Archivist Meetings:   [name] Marital Status:   Intimate Partner Violence:   . Fear of  Current or Ex-Partner:   . Emotionally Abused:   [name] Physically Abused:   . Sexually Abused:     Family History:    Diabetes in his mother; Heart disease in his paternal grandfather and paternal grandmother.   ROS:  Please see the history of present illness.  General:no colds or fevers, no weight changes Skin:no rashes or ulcers HEENT:no blurred vision, no congestion CV:see HPI PUL:see HPI GI:no diarrhea constipation or melena, no indigestion GU:no hematuria, no dysuria MS:no joint pain, no claudication Neuro:no syncope, no lightheadedness Endo:no diabetes, no thyroid disease  All other ROS reviewed and negative.     Physical Exam/Data:   Vitals:   05/22/19 1546 05/22/19 1548 05/22/19 1550 05/22/19 1553  BP:      Pulse: 70 75 86 83  Resp:      Temp:      TempSrc:      SpO2:      Weight:      Height:        Intake/Output Summary (Last 24 hours) at 05/22/2019 1755 Last data filed at 05/22/2019 1403 Gross per 24 hour  Intake --  Output 400 ml  Net -400 ml   Last 3 Weights 05/21/2019  Weight (lbs) 270 lb  Weight (kg) 122.471 kg     Body mass index is 34.67 kg/m.  EXAM per Dr. Debara Pickett   Relevant CV Studies: TTE 11/2018 IMPRESSIONS    1. Left ventricular ejection fraction, by visual estimation, is 15-20%%.  The left ventricle has normal function. Left ventricular septal wall  thickness was mildly increased. Mildly increased left ventricular  posterior wall thickness. There is mildly   increased left ventricular hypertrophy.  2. Mildly dilated left ventricular internal cavity size.  3. The left ventricle demonstrates global hypokinesis.  4. Global right ventricle has mildly reduced systolic function.The right  ventricular size is moderately enlarged. No increase in right ventricular  wall thickness.  5. Left atrial size was severely dilated.  6. Right atrial size was severely dilated.  7. Moderate pleural effusion in the left lateral region.  8. Moderate mitral annular calcification.  9. The mitral valve is normal in structure. Mild mitral valve  regurgitation. No evidence of mitral stenosis.  10. The tricuspid valve is normal in structure. Tricuspid valve  regurgitation moderate.  11. The aortic valve is normal in structure. Aortic valve regurgitation is  not visualized. No evidence of aortic valve sclerosis or stenosis.  12. There is Moderate sclerosis of the aortic valve.  13. There is Moderate thickening of the aortic valve.  14. The pulmonic valve was normal in structure. Pulmonic valve  regurgitation is not visualized.  15. There is mild dilatation of the ascending aorta measuring 41 mm.  16. Moderately elevated pulmonary artery systolic pressure.  17. A pacer wire is visualized.  18. The inferior vena cava is dilated in size with <50% respiratory  variability, suggesting right atrial pressure of 15 mmHg.   FINDINGS  Left Ventricle: Left ventricular ejection fraction, by visual estimation,  is 15-20%%. The left ventricle has normal function. The left ventricle  demonstrates global hypokinesis. The left ventricular internal cavity size  was mildly dilated left  ventricle. Mildly increased left ventricular posterior wall thickness.  There is mildly increased left ventricular hypertrophy. Normal left atrial  pressure.   Right Ventricle: The right ventricular size is moderately enlarged. No  increase in right ventricular wall thickness. Global RV  systolic function  is has mildly reduced systolic function. The tricuspid  regurgitant  velocity is 2.82 m/s, and with an assumed  right atrial pressure of 15 mmHg, the estimated right ventricular systolic  pressure is moderately elevated at 46.8 mmHg.   Left Atrium: Left atrial size was severely dilated.   Right Atrium: Right atrial size was severely dilated   Pericardium: There is no evidence of pericardial effusion. There is a  moderate pleural effusion in the left lateral region.   Mitral Valve: The mitral valve is normal in structure. Moderate mitral  annular calcification. No evidence of mitral valve stenosis by  observation. Mild mitral valve regurgitation.   Tricuspid Valve: The tricuspid valve is normal in structure. Tricuspid  valve regurgitation moderate.   Aortic Valve: The aortic valve is normal in structure.. There is Moderate  thickening and Moderate sclerosis of the aortic valve. Aortic valve  regurgitation is not visualized. The aortic valve is structurally normal,  with no evidence of sclerosis or  stenosis. There is Moderate thickening of the aortic valve. Moderate  sclerosis. Aortic valve mean gradient measures 1.0 mmHg. Aortic valve peak  gradient measures 2.4 mmHg. Aortic valve area, by VTI measures 2.80 cm.   Pulmonic Valve: The pulmonic valve was normal in structure. Pulmonic valve  regurgitation is not visualized.   Aorta: The aortic root, ascending aorta and aortic arch are all  structurally normal, with no evidence of dilitation or obstruction. There  is mild dilatation of the ascending aorta measuring 41 mm.   Venous: The inferior vena cava is dilated in size with less than 50%  respiratory variability, suggesting right atrial pressure of 15 mmHg.   IAS/Shunts: No atrial level shunt detected by color flow Doppler. No  ventricular septal defect is seen or detected. There is no evidence of an  atrial septal defect.    Additional Comments: A pacer  wire is visualized.    LEFT VENTRICLE  PLAX 2D  LVIDd:     7.10 cm  LVIDs:     6.60 cm  LV PW:     1.10 cm  LV IVS:    1.30 cm  LVOT diam:   2.00 cm  LV SV:     40 ml  LV SV Index:  15.12  LVOT Area:   3.14 cm     RIGHT VENTRICLE      IVC  RV Basal diam: 5.00 cm  IVC diam: 2.50 cm  RV Mid diam:  4.90 cm  RV S prime:   5.35 cm/s  TAPSE (M-mode): 1.5 cm   LEFT ATRIUM       Index    RIGHT ATRIUM      Index  LA diam:    4.80 cm 1.90 cm/m RA Area:   34.70 cm  LA Vol (A2C):  118.0 ml 46.69 ml/m RA Volume:  132.00 ml 52.23 ml/m  LA Vol (A4C):  129.0 ml 51.04 ml/m  LA Biplane Vol: 130.0 ml 51.44 ml/m  AORTIC VALVE  AV Area (Vmax):  2.62 cm  AV Area (Vmean):  2.20 cm  AV Area (VTI):   2.80 cm  AV Vmax:      77.60 cm/s  AV Vmean:     57.800 cm/s  AV VTI:      0.121 m  AV Peak Grad:   2.4 mmHg  AV Mean Grad:   1.0 mmHg  LVOT Vmax:     64.80 cm/s  LVOT Vmean:    40.500 cm/s  LVOT VTI:     0.108 m  LVOT/AV VTI ratio:  0.89    LAST cath 2018  Ramus lesion, 40 %stenosed.  Ost RCA to Prox RCA lesion, 10 %stenosed.  Mid RCA lesion, 0 %stenosed.  Dist RCA lesion, 10 %stenosed.  Ost LM to LM lesion, 30 %stenosed.  Ost LAD lesion, 40 %stenosed.  LV end diastolic pressure is moderately elevated.  Hemodynamic findings consistent with mild pulmonary hypertension.   1. Nonobstructive CAD. Continued patency of stents in the RCA 2. Elevated LVEDP 3. Mild pulmonary HTN. 4. Reduced cardiac output. 3.89 L/min with index of 1.5.  Plan: Medical management.    Laboratory Data:  High Sensitivity Troponin:  No results for input(s): TROPONINIHS in the last 720 hours.   Chemistry Recent Labs  Lab 05/21/19 2211 05/22/19 0500  NA 131* 134*  K 4.0 3.8  CL 99 99  CO2 18* 20*  GLUCOSE 90 74  BUN 61* 58*  CREATININE 2.65* 2.41*  CALCIUM 8.5* 8.7*  GFRNONAA 22*  25*  GFRAA 26* 29*  ANIONGAP 14 15    No results for input(s): PROT, ALBUMIN, AST, ALT, ALKPHOS, BILITOT in the last 168 hours. Hematology Recent Labs  Lab 05/21/19 2211 05/22/19 0500  WBC 9.5 9.6  RBC 4.17* 4.12*  HGB 12.9* 13.0  HCT 39.7 39.4  MCV 95.2 95.6  MCH 30.9 31.6  MCHC 32.5 33.0  RDW 15.3 15.2  PLT 316 303   BNPNo results for input(s): BNP, PROBNP in the last 168 hours.  DDimer No results for input(s): DDIMER in the last 168 hours.   Radiology/Studies:  CT Head Wo Contrast  Result Date: 05/21/2019 CLINICAL DATA:  Fall while on anti coagulation EXAM: CT HEAD WITHOUT CONTRAST TECHNIQUE: Contiguous axial images were obtained from the base of the skull through the vertex without intravenous contrast. COMPARISON:  None. FINDINGS: Brain: There is no mass, hemorrhage or extra-axial collection. There is generalized atrophy without lobar predilection. Hypodensity of the white matter is most commonly associated with chronic microvascular disease. Vascular: No abnormal hyperdensity of the major intracranial arteries or dural venous sinuses. No intracranial atherosclerosis. Skull: Left frontal scalp hematoma.  No skull fracture. Sinuses/Orbits: No fluid levels or advanced mucosal thickening of the visualized paranasal sinuses. No mastoid or middle ear effusion. The orbits are normal. IMPRESSION: 1. No acute intracranial abnormality. 2. Left frontal scalp hematoma without skull fracture. Electronically Signed   By: Ulyses Jarred M.D.   On: 05/21/2019 22:35        NO CHEST PAIN Assessment and Plan:   1. Falls. After ETOH.  BP was low but he runs low.  Dr. Debara Pickett has seen 2. CM with ICD stable in March with last  3. CKD chronic 4 did receive 200 cc NS on admit  4. CAD with hx of stents but stable on last 2 cath and no chest pain.        For questions or updates, please contact [place] Please consult [email or web address] for contact info under     Signed, Cecilie Kicks, NP    05/22/2019 5:55 PM

## 2019-05-23 DIAGNOSIS — I5020 Unspecified systolic (congestive) heart failure: Secondary | ICD-10-CM | POA: Diagnosis not present

## 2019-05-23 DIAGNOSIS — I255 Ischemic cardiomyopathy: Secondary | ICD-10-CM | POA: Diagnosis not present

## 2019-05-23 DIAGNOSIS — I951 Orthostatic hypotension: Secondary | ICD-10-CM | POA: Diagnosis not present

## 2019-05-23 DIAGNOSIS — N183 Chronic kidney disease, stage 3 unspecified: Secondary | ICD-10-CM | POA: Diagnosis not present

## 2019-05-23 DIAGNOSIS — F10929 Alcohol use, unspecified with intoxication, unspecified: Secondary | ICD-10-CM | POA: Diagnosis not present

## 2019-05-23 LAB — CBC
HCT: 38.5 % — ABNORMAL LOW (ref 39.0–52.0)
Hemoglobin: 12.6 g/dL — ABNORMAL LOW (ref 13.0–17.0)
MCH: 31 pg (ref 26.0–34.0)
MCHC: 32.7 g/dL (ref 30.0–36.0)
MCV: 94.8 fL (ref 80.0–100.0)
Platelets: 288 10*3/uL (ref 150–400)
RBC: 4.06 MIL/uL — ABNORMAL LOW (ref 4.22–5.81)
RDW: 15.3 % (ref 11.5–15.5)
WBC: 9.9 10*3/uL (ref 4.0–10.5)
nRBC: 0 % (ref 0.0–0.2)

## 2019-05-23 LAB — BASIC METABOLIC PANEL
Anion gap: 14 (ref 5–15)
BUN: 49 mg/dL — ABNORMAL HIGH (ref 8–23)
CO2: 20 mmol/L — ABNORMAL LOW (ref 22–32)
Calcium: 8.9 mg/dL (ref 8.9–10.3)
Chloride: 99 mmol/L (ref 98–111)
Creatinine, Ser: 2.02 mg/dL — ABNORMAL HIGH (ref 0.61–1.24)
GFR calc Af Amer: 36 mL/min — ABNORMAL LOW (ref >60)
GFR calc non Af Amer: 31 mL/min — ABNORMAL LOW (ref >60)
Glucose, Bld: 106 mg/dL — ABNORMAL HIGH (ref 70–99)
Potassium: 4 mmol/L (ref 3.5–5.1)
Sodium: 133 mmol/L — ABNORMAL LOW (ref 135–145)

## 2019-05-23 LAB — T4, FREE: Free T4: 0.89 ng/dL (ref 0.61–1.12)

## 2019-05-23 LAB — MAGNESIUM: Magnesium: 2.1 mg/dL (ref 1.7–2.4)

## 2019-05-23 MED ORDER — SPIRONOLACTONE 25 MG PO TABS: 12.5000 mg | ORAL_TABLET | Freq: Every day | ORAL | 0 refills | Status: DC

## 2019-05-23 MED ORDER — METOPROLOL SUCCINATE ER 25 MG PO TB24: 12.5000 mg | ORAL_TABLET | Freq: Every day | ORAL | 0 refills | Status: DC

## 2019-05-23 MED ORDER — ADULT MULTIVITAMIN W/MINERALS CH: 1.0000 | ORAL_TABLET | Freq: Every day | ORAL | 0 refills | Status: DC

## 2019-05-23 MED ORDER — LEVOTHYROXINE SODIUM 75 MCG PO TABS
37.5000 ug | ORAL_TABLET | Freq: Every day | ORAL | Status: DC
  Administered 2019-05-23: 37.5 ug via ORAL
  Filled 2019-05-23: qty 1

## 2019-05-23 MED ORDER — LEVOTHYROXINE SODIUM 75 MCG PO TABS: 37.5000 ug | ORAL_TABLET | Freq: Every day | ORAL | 0 refills | Status: DC

## 2019-05-23 MED ORDER — FOLIC ACID 1 MG PO TABS: 1.0000 mg | ORAL_TABLET | Freq: Every day | ORAL | 0 refills | Status: AC

## 2019-05-23 MED ORDER — METOPROLOL SUCCINATE ER 25 MG PO TB24
12.5000 mg | ORAL_TABLET | Freq: Every day | ORAL | Status: DC
  Administered 2019-05-23: 12.5 mg via ORAL
  Filled 2019-05-23: qty 1

## 2019-05-23 MED ORDER — FUROSEMIDE 40 MG PO TABS
40.0000 mg | ORAL_TABLET | Freq: Every day | ORAL | Status: DC
  Administered 2019-05-23: 40 mg via ORAL
  Filled 2019-05-23 (×2): qty 1

## 2019-05-23 MED ORDER — THIAMINE HCL 100 MG PO TABS: 100.0000 mg | ORAL_TABLET | Freq: Every day | ORAL | 0 refills | Status: AC

## 2019-05-23 MED ORDER — SPIRONOLACTONE 25 MG PO TABS: 25.0000 mg | ORAL_TABLET | Freq: Every day | ORAL | Status: DC

## 2019-05-23 MED ORDER — SPIRONOLACTONE 12.5 MG HALF TABLET
12.5000 mg | ORAL_TABLET | Freq: Every day | ORAL | Status: DC
  Administered 2019-05-23: 12.5 mg via ORAL
  Filled 2019-05-23 (×2): qty 1

## 2019-05-23 NOTE — Progress Notes (Signed)
Progress Note  Patient Name: Leonard [name] Date of Encounter: 05/23/2019  Primary Cardiologist: Virl Axe, MD   Subjective   Felling well. No chest pain or dyspnea. He was unaware that his alcohol level was high. Says had "two scotch".   Inpatient Medications    Scheduled Meds: . amiodarone  200 mg Oral BID  . atorvastatin  80 mg Oral QHS  . digoxin  125 mcg Oral Daily  . folic acid  1 mg Oral Daily  . levothyroxine  37.5 mcg Oral Q0600  . metoprolol succinate  12.5 mg Oral Daily  . multivitamin with minerals  1 tablet Oral Daily  . thiamine  100 mg Oral Daily   Or  . thiamine  100 mg Intravenous Daily  . ticagrelor  90 mg Oral BID   Continuous Infusions:  PRN Meds: acetaminophen **OR** acetaminophen, LORazepam **OR** LORazepam, ondansetron **OR** ondansetron (ZOFRAN) IV   Vital Signs    Vitals:   05/22/19 1811 05/23/19 0044 05/23/19 0536 05/23/19 0825  BP: 113/68 (!) 98/57 (!) 102/56 110/70  Pulse: 74 68 66   Resp: 18 16 17    Temp: 98.5 F (36.9 C) 97.8 F (36.6 C) 97.8 F (36.6 C)   TempSrc: Oral Oral Oral   SpO2: 100% 96% 97%   Weight:      Height:        Intake/Output Summary (Last 24 hours) at 05/23/2019 0826 Last data filed at 05/22/2019 1403 Gross per 24 hour  Intake --  Output 400 ml  Net -400 ml   Last 3 Weights 05/21/2019  Weight (lbs) 270 lb  Weight (kg) 122.471 kg      Telemetry    Wide complex with intermittent pacing  - Personally Reviewed  ECG    No new tracing   Physical Exam   GEN: No acute distress.   Neck: No JVD, R eye ecchymosis Cardiac: RRR, no murmurs, rubs, or gallops.  Respiratory: Clear to auscultation bilaterally. GI: Soft, nontender, non-distended  MS: No edema; No deformity. Multiple extremity bruise  Neuro:  Nonfocal  Psych: Normal affect   Labs    High Sensitivity Troponin:  No results for input(s): TROPONINIHS in the last 720 hours.    Chemistry Recent Labs  Lab 05/21/19 2211 05/22/19 0500  05/23/19 0640  NA 131* 134* 133*  K 4.0 3.8 4.0  CL 99 99 99  CO2 18* 20* 20*  GLUCOSE 90 74 106*  BUN 61* 58* 49*  CREATININE 2.65* 2.41* 2.02*  CALCIUM 8.5* 8.7* 8.9  GFRNONAA 22* 25* 31*  GFRAA 26* 29* 36*  ANIONGAP 14 15 14      Hematology Recent Labs  Lab 05/21/19 2211 05/22/19 0500 05/23/19 0640  WBC 9.5 9.6 9.9  RBC 4.17* 4.12* 4.06*  HGB 12.9* 13.0 12.6*  HCT 39.7 39.4 38.5*  MCV 95.2 95.6 94.8  MCH 30.9 31.6 31.0  MCHC 32.5 33.0 32.7  RDW 15.3 15.2 15.3  PLT 316 303 288     Radiology    CT Head Wo Contrast  Result Date: 05/21/2019 CLINICAL DATA:  Fall while on anti coagulation EXAM: CT HEAD WITHOUT CONTRAST TECHNIQUE: Contiguous axial images were obtained from the base of the skull through the vertex without intravenous contrast. COMPARISON:  None. FINDINGS: Brain: There is no mass, hemorrhage or extra-axial collection. There is generalized atrophy without lobar predilection. Hypodensity of the white matter is most commonly associated with chronic microvascular disease. Vascular: No abnormal hyperdensity of the major intracranial arteries or  dural venous sinuses. No intracranial atherosclerosis. Skull: Left frontal scalp hematoma.  No skull fracture. Sinuses/Orbits: No fluid levels or advanced mucosal thickening of the visualized paranasal sinuses. No mastoid or middle ear effusion. The orbits are normal. IMPRESSION: 1. No acute intracranial abnormality. 2. Left frontal scalp hematoma without skull fracture. Electronically Signed   By: Ulyses Jarred M.D.   On: 05/21/2019 22:35    Cardiac Studies   None this admission   Patient Profile     74 y.o. male  with a hx of CAD, ICM, HFrEF with EF 15-20% on recent studies s/p AICD who admitted 05/21/19 after multiple falls,  Hit his forehead during a fall, in setting of " 2 drinks of ETOH" and found to have hypotension and elevated scr consistent with dehydration.   Assessment & Plan    1. Chronic systolic CHF 2. ICM  s/p ICD 3. CAD 4. Acute on CKD [name]  Scr improved with hydration 2.65>>2.41>>2.02. blood pressure improving. Last reading 110/70. Continue Toprol XL 12.5mg  qd. Spironolactone and lasix on hold (resumption per MD).   5. Alcholol abuse Alcohol level 170. Watch for withdrawals. Recommended cessation.  Ambulate prior to discharge.   6. Elevated TSH - Per primary team   For questions or updates, please contact [place] Please consult [email or web address] for contact info under       SignedLeanor Kail, PA  05/23/2019, 8:26 AM

## 2019-05-23 NOTE — Progress Notes (Signed)
RN gave pt discharge instructions to him and his wife and they stated understanded. IV has been removed new medications have been escribed to pt home pharmacy and belongings have been packed.

## 2019-05-23 NOTE — TOC Initial Note (Addendum)
Transition of Care [facility]) - Initial/Assessment Note    Patient Details  Name: Leonard [name] MRN: [id number] Date of Birth: 1945/10/08  [facility] CM/SW Contact:    Marilu Favre, RN Phone Number: 05/23/2019, 11:52 AM  Clinical Narrative:                  Spoke to patient and wife at bedside. Confirmed face sheet information. Both in agreement for home health PT. Provided medicare.gov list. They would like the agency they had the last time, they believe it was [place]. Tawanna Solo with [facility], no record of them having patient.   Patient and wife aware. They now think it may have been [facility]. Called Tiffany with [facility]. Awaiting call back . Tiffany with [facility] confirmed they have had patient before. She can accept referral for HHPT/OT however start of care will not be until Monday. Patient and wife aware and still want [facility]  Expected Discharge Plan: [place] Barriers to Discharge: Continued Medical Work up   Patient Goals and CMS Choice Patient states their goals for this hospitalization and ongoing recovery are:: to return to home CMS Medicare.gov Compare Post Acute Care list provided to:: Patient Choice offered to / list presented to : Patient, Spouse  Expected Discharge Plan and Services Expected Discharge Plan: [place] Choice: [place] arrangements for the past 2 months: Single Family Home Expected Discharge Date: 05/23/19                         [facility] Arranged: PT          Prior Living Arrangements/Services Living arrangements for the past 2 months: Single Family Home   Patient language and need for interpreter reviewed:: Yes        Need for Family Participation in Patient Care: Yes (Comment) Care giver support system in place?: Yes (comment)   Criminal Activity/Legal Involvement Pertinent to Current  Situation/Hospitalization: No - Comment as needed  Activities of Daily Living      Permission Sought/Granted   Permission granted to share information with : Yes, Verbal Permission Granted  Share Information with NAME: [name] wife           Emotional Assessment Appearance:: Appears stated age Attitude/Demeanor/Rapport: Engaged Affect (typically observed): Accepting Orientation: : Oriented to Self, Oriented to Place, Oriented to  Time, Oriented to Situation Alcohol / Substance Use: Not Applicable Psych Involvement: No (comment)  Admission diagnosis:  Renal impairment [N28.9] Multiple abrasions [T07.XXXA] Hypotension [I95.9] Fall, initial encounter [W19.XXXA] Hypotension, unspecified hypotension type [I95.9] Patient Active Problem List   Diagnosis Date Noted  . Hypotension 05/21/2019  . Acute alcohol intoxication ([place]) 05/21/2019  . HFrEF (heart failure with reduced ejection fraction) ([place]) 05/21/2019  . CKD (chronic kidney disease) stage 3, GFR 30-59 ml/min 05/21/2019  . Ischemic cardiomyopathy 05/21/2019  . CAD (coronary artery disease) 05/21/2019   PCP:  Girtha Rm, NP-C Pharmacy:  No Pharmacies Listed    Social Determinants of Health (SDOH) Interventions    Readmission Risk Interventions No flowsheet data found.

## 2019-05-23 NOTE — Evaluation (Signed)
Physical Therapy Evaluation Patient Details Name: Leonard [name] MRN: [id number] DOB: 27-Jul-1945 Today's Date: 05/23/2019   History of Present Illness  This 74 y.o. male arrived in ED after suffering multiple falls.   PMH includes: CAD, ICM, EF 15-20%, chronic hypotension, CKD stage 3-4, anxiety, h/o STEMI.  Dx: possible dehydration due ETOH intake, or falls secondary to ETOH intoxication  Clinical Impression   Pt presents with generalized weakness, unsteadiness in standing and is high fall risk with history of multiple falls, increased time and effort to mobilize, and decreased activity tolerance. Pt to benefit from acute PT to address deficits. Pt ambulated short hallway distance with use of RW to steady self during gait, requires cuing from PT for form and safety during gait training. PT recommending HHPT to address deficits in home environment upon d/c, pt agreeable to this plan given weakness and fall risk. PT to progress mobility as tolerated, and will continue to follow acutely.      Follow Up Recommendations Home health PT;Supervision for mobility/OOB    Equipment Recommendations  None recommended by PT    Recommendations for Other Services       Precautions / Restrictions Precautions Precautions: Fall Precaution Comments: h/o falls  Restrictions Weight Bearing Restrictions: No      Mobility  Bed Mobility Overal bed mobility: Needs Assistance             General bed mobility comments: pt up in chiar upon PT arrival to room, requests stay in chair upon PT exit.  Transfers Overall transfer level: Needs assistance Equipment used: Rolling walker (2 wheeled) Transfers: Sit to/from Stand Sit to Stand: Min guard         General transfer comment: Min guard for safety, increased time to rise and steady and pt exclaiming "this chair is deep" when struggling to stand.  Ambulation/Gait Ambulation/Gait assistance: Min guard Gait Distance (Feet): 60 Feet Assistive  device: Rolling walker (2 wheeled) Gait Pattern/deviations: Step-through pattern;Decreased stride length;Trunk flexed Gait velocity: decr   General Gait Details: Min guard for safety, verbal cuing for upright posture, "tucking tailbone" to come to neutral hip extension, and placement in RW. Slow and slightly unsteady gait.  Stairs Stairs: (politely declines stair training at this time, states his wife will assist him into house and he has rails)          Wheelchair Mobility    Modified Rankin (Stroke Patients Only)       Balance Overall balance assessment: Needs assistance;History of Falls Sitting-balance support: No upper extremity supported;Feet supported Sitting balance-Leahy Scale: Good     Standing balance support: Bilateral upper extremity supported Standing balance-Leahy Scale: Poor Standing balance comment: reliant on external support during dynamic standing tasks                             Pertinent Vitals/Pain Pain Assessment: Faces Faces Pain Scale: No hurt Pain Intervention(s): Monitored during session    Home Living Family/patient expects to be discharged to:: Private residence Living Arrangements: Spouse/significant other Available Help at Discharge: Family;Available 24 hours/day Type of Home: House Home Access: Stairs to enter Entrance Stairs-Rails: Right;Left;Can reach both Entrance Stairs-Number of Steps: 2 Home Layout: One level Home Equipment: Walker - 2 wheels;Cane - single point      Prior Function Level of Independence: Independent with assistive device(s)         Comments: Pt reports using cane or RW for ambulation PTA, states he uses  RW for longer distances only. Pt reports 1 fall in the past 6 months due to cat tripping him. Pt is a Recruitment consultant at [employer]     Hand Dominance   Dominant Hand: Right    Extremity/Trunk Assessment   Upper Extremity Assessment Upper Extremity Assessment: Defer to OT  evaluation RUE Deficits / Details: tremors noted R hand, suspect related to ETOH    Lower Extremity Assessment Lower Extremity Assessment: Generalized weakness    Cervical / Trunk Assessment Cervical / Trunk Assessment: Normal  Communication   Communication: No difficulties  Cognition Arousal/Alertness: Awake/alert Behavior During Therapy: WFL for tasks assessed/performed Overall Cognitive Status: Within Functional Limits for tasks assessed                                 General Comments: Pt A&Ox4, answers questions appropriately. Mild safety awareness issues during mobility, responds well to verbal cuing.      General Comments General comments (skin integrity, edema, etc.): BP, HR post-ambulation: 106/67 (79), 84 bpm, pt reports no dizziness or lightheadedness    Exercises     Assessment/Plan    PT Assessment Patient needs continued PT services  PT Problem List Decreased strength;Decreased mobility;Decreased safety awareness;Decreased activity tolerance;Decreased balance;Decreased knowledge of use of DME;Obesity       PT Treatment Interventions DME instruction;Therapeutic activities;Gait training;Therapeutic exercise;Patient/family education;Balance training;Stair training;Neuromuscular re-education;Functional mobility training    PT Goals (Current goals can be found in the Care Plan section)  Acute Rehab PT Goals Patient Stated Goal: go home today PT Goal Formulation: With patient Time For Goal Achievement: 06/06/19 Potential to Achieve Goals: Good    Frequency Min 3X/week   Barriers to discharge        Co-evaluation               AM-PAC PT "6 Clicks" Mobility  Outcome Measure Help needed turning from your back to your side while in a flat bed without using bedrails?: A Little Help needed moving from lying on your back to sitting on the side of a flat bed without using bedrails?: A Little Help needed moving to and from a bed to a chair  (including a wheelchair)?: A Little Help needed standing up from a chair using your arms (e.g., wheelchair or bedside chair)?: A Little Help needed to walk in hospital room?: A Little Help needed climbing 3-5 steps with a railing? : A Little 6 Click Score: 18    End of Session Equipment Utilized During Treatment: Gait belt Activity Tolerance: Patient limited by fatigue Patient left: in chair;with call bell/phone within reach(pt educated on pressing call button and waiting for assist prior to mobilizing, pt voices agreement) Nurse Communication: Mobility status PT Visit Diagnosis: Other abnormalities of gait and mobility (R26.89);History of falling (Z91.81)    Time: 2330-0762 PT Time Calculation (min) (ACUTE ONLY): 15 min   Charges:   PT Evaluation $PT Eval Low Complexity: 1 Low        Vista Sawatzky E, PT Acute Rehabilitation Services Pager [phone]  Office Gordon D Vollie Brunty 05/23/2019, 10:09 AM

## 2019-05-23 NOTE — Discharge Summary (Signed)
Discharge Summary  Leonard [name] ELF:[id number] DOB: 1945-02-12  PCP: Girtha Rm, NP-C  Admit date: 05/21/2019 Discharge date: 05/23/2019  Time spent: 35 minutes  Recommendations for Outpatient Follow-up:  1. Follow-up with your cardiologist within a week 2. Follow-up with your primary care provider 3. Take your medications as prescribed 4. Continue PT OT with assistance and fall precautions  Discharge Diagnoses:  Active Hospital Problems   Diagnosis Date Noted  . Acute alcohol intoxication ([place]) 05/21/2019  . Hypotension 05/21/2019  . HFrEF (heart failure with reduced ejection fraction) ([place]) 05/21/2019  . CKD (chronic kidney disease) stage 3, GFR 30-59 ml/min 05/21/2019  . Ischemic cardiomyopathy 05/21/2019  . CAD (coronary artery disease) 05/21/2019    Resolved Hospital Problems  No resolved problems to display.    Discharge Condition: Stable  Diet recommendation: Heart healthy diet.  Vitals:   05/23/19 0825 05/23/19 1026  BP: 110/70 103/73  Pulse:  76  Resp:    Temp:    SpO2:      History of present illness:  Leonard [name] is a 74 y.o. malewith medical history significant forCAD, ICM, HFrEF with EF 15-20% s/p AICD, CKD stage 3B who presented to the ED after multiple falls. Hit forehead during one of the falls. Admits to having "2 drinks". Initially declined evaluation but then allowed it after his wife was called. Denies headache or pain elsewhere.  TRH asked to admit.  ED Course: Alcohol level 170, BP initially 58N systolic, Received 277OE NS, responded well with increase of SBP 100-110.   CT head shows no acute intracranial abnormality, +left frontal scalp hematoma without skull fracture.  Due to soft blood pressures, his cardiac medications were placed on hold and cardiology was consulted to address his cardiac medications.   Seen by cardiology with the following recommendations: Plan resume lasix and decrease aldactone to 12.5 mg daily.  Continue other CHF meds. Follow-up with cardiology in 7-10 days at [facility].  Ok to [employer] home today from our standpoint.  05/23/19: Seen and examined.  He has no new complaints.  He is adamant about going home today.  His blood pressure is soft.  Toprol-XL dose reduced to 12.5 daily and Aldactone reduced to 12.5 daily to avoid symptomatic hypotension.  Patient advised to have a close follow-up appointment with his primary cardiologist within a week.  Patient understands and agrees to plan.   Hospital Course:  Principal Problem:   Acute alcohol intoxication ([place]) Active Problems:   Hypotension   HFrEF (heart failure with reduced ejection fraction) ([facility])   CKD (chronic kidney disease) stage 3, GFR 30-59 ml/min   Ischemic cardiomyopathy   CAD (coronary artery disease)  Fall, suspect secondary to alcohol intoxication versus mechanical fall CT head no acute intracranial abnormalities, has left frontal scalp hematoma Alcohol cessation counseling done at bedside, shows no intention to quit PT OT evaluated, recommendation for home health PT Continue PT OT with assistance and fall precautions  Hypotension, improved, suspect in the setting of his heart failure medications Entresto held to avoid symptomatic hypotension Toprol-XL dose reduced to 12.5 daily and Aldactone reduced to 12.5 daily to avoid symptomatic hypotension. Also on Lasix 40 mg daily, amiodarone 200 mg twice daily, and digoxin 0.125 mg daily Closely follow-up with your primary cardiologist within a week.  Hypothyroidism, TSH 13, Free T4 0.8 He is on levothyroxine at home Increased levothyroxine dose to 37.5 mcg daily from 25 mcg daily Consider repeating TSH in 6 to 8 weeks Follow-up with your  PCP  AKI on CKD IIIB Baseline creatinine appears to be 2.0 with GFR of 31 Back to his baseline creatinine Presented with creatinine of 2.65 and GFR 22 Continue to avoid nephrotoxins and hypotension. Follow-up with your PCP  Alcohol  intoxication with concern for withdrawal Presented with alcohol level 170 CIWA protocol was activated.   No evidence of alcohol withdrawal at the time of this visit Continue multivitamin, thiamine and folate Alcohol cessation counseling done at bedside, patient does not show interest in quitting alcohol use Follow-up with PCP  Chronic systolic CHF EF 15 to 36% status post AICD/ischemic cardiomyopathy/coronary artery disease Denies any anginal symptoms Close follow-up with your primary cardiologist within a week Take your medications as prescribed  QTC prolongation QTC greater than 600 Has AICD in place Avoid QTC prolonging agents Follow-up with your cardiologist  Hyperlipidemia Continue Lipitor  Ambulatory dysfunction Recently started using a walker PT OT evaluated, recommendation for home health PT Continue PT OT with assistance and fall precautions     Code Status:Full     Consults called: Cardiology.    Discharge Exam: BP 103/73   Pulse 76   Temp 97.8 F (36.6 C) (Oral)   Resp 17   Ht 6\' 2"  (1.88 m)   Wt 122.5 kg   SpO2 97%   BMI 34.67 kg/m  . General: 74 y.o. year-old male well developed well nourished in no acute distress.  Alert and oriented x3. . Cardiovascular: Regular rate and rhythm with no rubs or gallops.   [name] Respiratory: Clear to auscultation with no wheezes or rales.  . Abdomen: Obese nontender nondistended with normal bowel sounds. . Musculoskeletal: Trace lower extremity edema bilaterally . Psychiatry: Mood is appropriate for condition and setting  Discharge Instructions You were cared for by a hospitalist during your hospital stay. If you have any questions about your discharge medications or the care you received while you were in the hospital after you are discharged, you can call the unit and asked to speak with the hospitalist on call if the hospitalist that took care of you is not available. Once you are discharged, your  primary care physician will handle any further medical issues. Please note that NO REFILLS for any discharge medications will be authorized once you are discharged, as it is imperative that you return to your primary care physician (or establish a relationship with a primary care physician if you do not have one) for your aftercare needs so that they can reassess your need for medications and monitor your lab values.   Allergies as of 05/23/2019      Reactions   Shellfish Allergy Anaphylaxis   Sulfa Antibiotics Other (See Comments)   unknown      Medication List    STOP taking these medications   Entresto 49-51 MG Generic drug: sacubitril-valsartan     TAKE these medications   amiodarone 200 MG tablet Commonly known as: PACERONE Take 200 mg by mouth 2 (two) times daily.   atorvastatin 80 MG tablet Commonly known as: LIPITOR Take 80 mg by mouth at bedtime.   Brilinta 90 MG Tabs tablet Generic drug: ticagrelor Take 90 mg by mouth 2 (two) times daily.   digoxin 0.125 MG tablet Commonly known as: LANOXIN Take 125 mcg by mouth daily.   folic acid 1 MG tablet Commonly known as: FOLVITE Take 1 tablet (1 mg total) by mouth daily. Start taking on: May 24, 2019   furosemide 40 MG tablet Commonly known as: LASIX Take  40 mg by mouth daily.   levothyroxine 75 MCG tablet Commonly known as: SYNTHROID Take 0.5 tablets (37.5 mcg total) by mouth daily at 6 (six) AM. Start taking on: May 24, 2019 What changed:   medication strength  how much to take  when to take this   metoprolol succinate 25 MG 24 hr tablet Commonly known as: TOPROL-XL Take 0.5 tablets (12.5 mg total) by mouth daily. Start taking on: May 24, 2019 What changed:   medication strength  how much to take   multivitamin with minerals Tabs tablet Take 1 tablet by mouth daily. Start taking on: May 24, 2019   spironolactone 25 MG tablet Commonly known as: ALDACTONE Take 0.5 tablets (12.5 mg total) by mouth  daily. What changed: how much to take   thiamine 100 MG tablet Take 1 tablet (100 mg total) by mouth daily. Start taking on: May 24, 2019      Allergies  Allergen Reactions  . Shellfish Allergy Anaphylaxis  . Sulfa Antibiotics Other (See Comments)    unknown   Follow-up Information    Henson, Vickie L, NP-C. Call in 1 week(s).   Specialty: Family Medicine Contact information: [place] [address] [phone]        Deboraha Sprang, MD .   Specialty: Cardiology Contact information: [phone] [address] [place] [address] [phone]        [facility] Follow up.   Specialty: Home Health Services Contact information: [place] [address] [phone]            The results of significant diagnostics from this hospitalization (including imaging, microbiology, ancillary and laboratory) are listed below for reference.    Significant Diagnostic Studies: CT Head Wo Contrast  Result Date: 05/21/2019 CLINICAL DATA:  Fall while on anti coagulation EXAM: CT HEAD WITHOUT CONTRAST TECHNIQUE: Contiguous axial images were obtained from the base of the skull through the vertex without intravenous contrast. COMPARISON:  None. FINDINGS: Brain: There is no mass, hemorrhage or extra-axial collection. There is generalized atrophy without lobar predilection. Hypodensity of the white matter is most commonly associated with chronic microvascular disease. Vascular: No abnormal hyperdensity of the major intracranial arteries or dural venous sinuses. No intracranial atherosclerosis. Skull: Left frontal scalp hematoma.  No skull fracture. Sinuses/Orbits: No fluid levels or advanced mucosal thickening of the visualized paranasal sinuses. No mastoid or middle ear effusion. The orbits are normal. IMPRESSION: 1. No acute intracranial abnormality. 2. Left frontal scalp hematoma without skull fracture. Electronically Signed   By: Ulyses Jarred M.D.   On: 05/21/2019 22:35    Microbiology: Recent Results (from the past 240 hour(s))  SARS CORONAVIRUS 2 (TAT 6-24 HRS) Nasopharyngeal Nasopharyngeal Swab     Status: None   Collection Time: 05/22/19 12:50 AM   Specimen: Nasopharyngeal Swab  Result Value Ref Range Status   SARS Coronavirus 2 NEGATIVE NEGATIVE Final    Comment: (NOTE) SARS-CoV-2 target nucleic acids are NOT DETECTED. The SARS-CoV-2 RNA is generally detectable in upper and lower respiratory specimens during the acute phase of infection. Negative results do not preclude SARS-CoV-2 infection, do not rule out co-infections with other pathogens, and should not be used as the sole basis for treatment or other patient management decisions. Negative results must be combined with clinical observations, patient history, and epidemiological information. The expected result is Negative. Fact Sheet for Patients: SugarRoll.be Fact Sheet for Healthcare Providers: https://www.woods-mathews.com/ This test is not yet  approved or cleared by the [place] and  has been authorized for detection and/or diagnosis of SARS-CoV-2 by FDA under an Emergency Use Authorization (EUA). This EUA will remain  in effect (meaning this test can be used) for the duration of the COVID-19 declaration under Section 56 4(b)(1) of the Act, 21 U.S.C. section 360bbb-3(b)(1), unless the authorization is terminated or revoked sooner. Performed at [facility], [place] [address] [place], [place] [address]      Labs: Basic Metabolic Panel: Recent Labs  Lab 05/21/19 2211 05/22/19 0500 05/23/19 0640  NA 131* 134* 133*  K 4.0 3.8 4.0  CL 99 99 99  CO2 18* 20* 20*  GLUCOSE 90 74 106*  BUN 61* 58* 49*  CREATININE 2.65* 2.41* 2.02*  CALCIUM 8.5* 8.7* 8.9  MG  --  2.2 2.1  PHOS  --  4.5  --    Liver Function Tests: No results for input(s): AST, ALT, ALKPHOS, BILITOT, PROT, ALBUMIN in the  last 168 hours. No results for input(s): LIPASE, AMYLASE in the last 168 hours. No results for input(s): AMMONIA in the last 168 hours. CBC: Recent Labs  Lab 05/21/19 2211 05/22/19 0500 05/23/19 0640  WBC 9.5 9.6 9.9  NEUTROABS 6.9  --   --   HGB 12.9* 13.0 12.6*  HCT 39.7 39.4 38.5*  MCV 95.2 95.6 94.8  PLT 316 303 288   Cardiac Enzymes: No results for input(s): CKTOTAL, CKMB, CKMBINDEX, TROPONINI in the last 168 hours. BNP: BNP (last 3 results) No results for input(s): BNP in the last 8760 hours.  ProBNP (last 3 results) No results for input(s): PROBNP in the last 8760 hours.  CBG: No results for input(s): GLUCAP in the last 168 hours.     Signed:  Kayleen Memos, MD [facility] 05/23/2019, 1:18 PM

## 2019-05-23 NOTE — Discharge Instructions (Signed)
Hypotension As your heart beats, it forces blood through your body. This force is called blood pressure. If you have hypotension, you have low blood pressure. When your blood pressure is too low, you may not get enough blood to your brain or other parts of your body. This may cause you to feel weak, light-headed, have a fast heartbeat, or even pass out (faint). Low blood pressure may be harmless, or it may cause serious problems. What are the causes?  Blood loss.  Not enough water in the body (dehydration).  Heart problems.  Hormone problems.  Pregnancy.  A very bad infection.  Not having enough of certain nutrients.  Very bad allergic reactions.  Certain medicines. What increases the risk?  Age. The risk increases as you get older.  Conditions that affect the heart or the brain and spinal cord (central nervous system).  Taking certain medicines.  Being pregnant. What are the signs or symptoms?  Feeling: ? Weak. ? Light-headed. ? Dizzy. ? Tired (fatigued).  Blurred vision.  Fast heartbeat.  Passing out, in very bad cases. How is this treated?  Changing your diet. This may involve eating more salt (sodium) or drinking more water.  Taking medicines to raise your blood pressure.  Changing how much you take (the dosage) of some of your medicines.  Wearing compression stockings. These stockings help to prevent blood clots and reduce swelling in your legs. In some cases, you may need to go to the hospital for:  Fluid replacement. This means you will receive fluids through an IV tube.  Blood replacement. This means you will receive donated blood through an IV tube (transfusion).  Treating an infection or heart problems, if this applies.  Monitoring. You may need to be monitored while medicines that you are taking wear off. Follow these instructions at home: Eating and drinking   Drink enough fluids to keep your pee (urine) pale yellow.  Eat a healthy diet.  Follow instructions from your doctor about what you can eat or drink. A healthy diet includes: ? Fresh fruits and vegetables. ? Whole grains. ? Low-fat (lean) meats. ? Low-fat dairy products.  Eat extra salt only as told. Do not add extra salt to your diet unless your doctor tells you to.  Eat small meals often.  Avoid standing up quickly after you eat. Medicines  Take over-the-counter and prescription medicines only as told by your doctor. ? Follow instructions from your doctor about changing how much you take of your medicines, if this applies. ? Do not stop or change any of your medicines on your own. General instructions   Wear compression stockings as told by your doctor.  Get up slowly from lying down or sitting.  Avoid hot showers and a lot of heat as told by your doctor.  Return to your normal activities as told by your doctor. Ask what activities are safe for you.  Do not use any products that contain nicotine or tobacco, such as cigarettes, e-cigarettes, and chewing tobacco. If you need help quitting, ask your doctor.  Keep all follow-up visits as told by your doctor. This is important. Contact a doctor if:  You throw up (vomit).  You have watery poop (diarrhea).  You have a fever for more than 2-3 days.  You feel more thirsty than normal.  You feel weak and tired. Get help right away if:  You have chest pain.  You have a fast or uneven heartbeat.  You lose feeling (have numbness) in any   part of your body.  You cannot move your arms or your legs.  You have trouble talking.  You get sweaty or feel light-headed.  You pass out.  You have trouble breathing.  You have trouble staying awake.  You feel mixed up (confused). Summary  Hypotension is also called low blood pressure. It is when the force of blood pumping through your arteries is too weak.  Hypotension may be harmless, or it may cause serious problems.  Treatment may include changing  your diet and medicines, and wearing compression stockings.  In very bad cases, you may need to go to the hospital. This information is not intended to replace advice given to you by your health care provider. Make sure you discuss any questions you have with your health care provider. Document Revised: 07/01/2017 Document Reviewed: 07/01/2017 Elsevier Patient Education  [place].  Heart Failure Action Plan A heart failure action plan helps you understand what to do when you have symptoms of heart failure. Follow the plan that was created by you and your health care provider. Review your plan each time you visit your health care provider. Red zone These signs and symptoms mean you should get medical help right away:  You have trouble breathing when resting.  You have a dry cough that is getting worse.  You have swelling or pain in your legs or abdomen that is getting worse.  You suddenly gain more than 2-3 lb (0.9-1.4 kg) in a day, or more than 5 lb (2.3 kg) in one week. This amount may be more or less depending on your condition.  You have trouble staying awake or you feel confused.  You have chest pain.  You do not have an appetite.  You pass out. If you experience any of these symptoms:  Call your local emergency services (911 in the [place] right away or seek help at the emergency department of the nearest hospital. Yellow zone These signs and symptoms mean your condition may be getting worse and you should make some changes:  You have trouble breathing when you are active or you need to sleep with extra pillows.  You have swelling in your legs or abdomen.  You gain 2-3 lb (0.9-1.4 kg) in one day, or 5 lb (2.3 kg) in one week. This amount may be more or less depending on your condition.  You get tired easily.  You have trouble sleeping.  You have a dry cough. If you experience any of these symptoms:  Contact your health care provider within the next  day.  Your health care provider may adjust your medicines. Green zone These signs mean you are doing well and can continue what you are doing:  You do not have shortness of breath.  You have very little swelling or no new swelling.  Your weight is stable (no gain or loss).  You have a normal activity level.  You do not have chest pain or any other new symptoms. Follow these instructions at home:  Take over-the-counter and prescription medicines only as told by your health care provider.  Weigh yourself daily. Your target weight is __________ lb (__________ kg). ? Call your health care provider if you gain more than __________ lb (__________ kg) in a day, or more than __________ lb (__________ kg) in one week.  Eat a heart-healthy diet. Work with a diet and nutrition specialist (dietitian) to create an eating plan that is best for you.  Keep all follow-up visits as told  by your health care provider. This is important. Where to find more information  American Heart Association: www.heart.org Summary  Follow the action plan that was created by you and your health care provider.  Get help right away if you have any symptoms in the Red zone. This information is not intended to replace advice given to you by your health care provider. Make sure you discuss any questions you have with your health care provider. Document Revised: 12/18/2016 Document Reviewed: 02/15/2016 Elsevier Patient Education  2020 [employer].

## 2019-05-25 ENCOUNTER — Telehealth: Payer: Self-pay

## 2019-05-25 NOTE — Telephone Encounter (Signed)
I called the pt. Per my TOC report to get him scheduled for a hospital f/u which I did on 06/01/19. I also went over all his medication changes and his medications were reconciled.

## 2019-05-31 ENCOUNTER — Telehealth: Payer: Self-pay | Admitting: Family Medicine

## 2019-05-31 NOTE — Telephone Encounter (Signed)
Ok to order PT?

## 2019-05-31 NOTE — Telephone Encounter (Signed)
Received a call from [place] with [facility]. Pt was recently in hospital and a home health evaluation was ordered by them. That has been done and she is requesting orders physical therapyfor the following:  2 times a week for 3 weeks and 1 time a week for 3 weeks.   Please advise Erin at [phone].

## 2019-05-31 NOTE — Telephone Encounter (Signed)
Called and spoke to [place] to give verbal orders

## 2019-06-01 ENCOUNTER — Encounter: Payer: Self-pay | Admitting: Family Medicine

## 2019-06-01 ENCOUNTER — Telehealth: Payer: Self-pay | Admitting: Family Medicine

## 2019-06-01 ENCOUNTER — Ambulatory Visit (INDEPENDENT_AMBULATORY_CARE_PROVIDER_SITE_OTHER): Payer: Medicare Other | Admitting: Family Medicine

## 2019-06-01 ENCOUNTER — Other Ambulatory Visit: Payer: Self-pay

## 2019-06-01 VITALS — BP 120/78 | HR 75 | Temp 97.5°F | Wt 248.4 lb

## 2019-06-01 DIAGNOSIS — Z79899 Other long term (current) drug therapy: Secondary | ICD-10-CM | POA: Diagnosis not present

## 2019-06-01 DIAGNOSIS — I255 Ischemic cardiomyopathy: Secondary | ICD-10-CM

## 2019-06-01 DIAGNOSIS — F419 Anxiety disorder, unspecified: Secondary | ICD-10-CM | POA: Diagnosis not present

## 2019-06-01 DIAGNOSIS — E039 Hypothyroidism, unspecified: Secondary | ICD-10-CM | POA: Diagnosis not present

## 2019-06-01 DIAGNOSIS — Z09 Encounter for follow-up examination after completed treatment for conditions other than malignant neoplasm: Secondary | ICD-10-CM | POA: Diagnosis not present

## 2019-06-01 DIAGNOSIS — I10 Essential (primary) hypertension: Secondary | ICD-10-CM | POA: Diagnosis not present

## 2019-06-01 DIAGNOSIS — N289 Disorder of kidney and ureter, unspecified: Secondary | ICD-10-CM

## 2019-06-01 DIAGNOSIS — I5042 Chronic combined systolic (congestive) and diastolic (congestive) heart failure: Secondary | ICD-10-CM | POA: Diagnosis not present

## 2019-06-01 DIAGNOSIS — F341 Dysthymic disorder: Secondary | ICD-10-CM

## 2019-06-01 DIAGNOSIS — R0601 Orthopnea: Secondary | ICD-10-CM

## 2019-06-01 DIAGNOSIS — N183 Chronic kidney disease, stage 3 unspecified: Secondary | ICD-10-CM | POA: Diagnosis not present

## 2019-06-01 MED ORDER — VENLAFAXINE HCL 75 MG PO TABS: 75.0000 mg | ORAL_TABLET | Freq: Every day | ORAL | 5 refills | Status: DC

## 2019-06-01 NOTE — Telephone Encounter (Signed)
Leonard [name] from [facility] called and wanted to let you know that pt was assessed for home health ot was it was no longer needed she can be reached at [phone] with any questions

## 2019-06-01 NOTE — Progress Notes (Signed)
Subjective:    Patient ID: Leonard [name], male    DOB: 1945-07-27, 74 y.o.   MRN: [id number]  HPI Chief Complaint  Patient presents with  . hospital    hospital follow-up. fell  trying to feed cat.  having to use walker.    He is a 20 year old Caucasian male with a history of heart failure, CAD, ischemic cardiomyopathy, ST elevated MI, QTC prolongation, HL, CKD, anxiety and depression who is here today with his wife for hospital discharge follow-up. Has AICD in place.  He presented to the emergency department on 05/21/2019 for fall and alcohol intoxication.  He was also found to be hypotensive.  He was discharged on 05/23/2019.  Currently receiving home health through [facility].  He is having OT and PT.  States he is doing well. No new concerns since being discharged home. His wife would like for him to start back on Effexor and he would also like to start back. Denies feeling depressed today but endorses anxiety.   Reports having cardiology appt on 06/23/2019 with Dr. Ellyn Hack   Denies fever, chills, dizziness, chest pain, palpitations, shortness of breath, abdominal pain, nausea, vomiting, diarrhea.  He is using a walker and is at risk for falls.  States BP has been ok since being home. Delene Loll was stopped in hospital. Denies significantly low readings.   States he has been sleeping in a chair at times during the night. States he cannot lay completely flat due to shortness of breath.   States he is not consumed any alcohol since being discharged home.  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   Expresses concern regarding older age and limitations.  States he has a goal to be able to drive himself again.    Hypothyroidism and his dose of levothyroxine was increased while hospitalized.  Reviewed allergies, medications, past medical, surgical, family, and social history.   Review of Systems Pertinent  positives and negatives in the history of present illness.     Objective:   Physical Exam BP 120/78   Pulse 75   Temp (!) 97.5 F (36.4 C)   Wt 248 lb 6.4 oz (112.7 kg)   SpO2 97%   BMI 31.89 kg/m         Assessment & Plan:  Hospital discharge follow-up -reviewed discharge summary, labs, imaging and medications.  His wife is with him. He is using a walker. Getting home PT and OT.  Most recent hospitalization for fall and acute alcohol intoxication. He has not had any alcohol since being home. States he does not need help, no cravings. We discussed stopping alcohol entirely due to the increased health risks.   Chronic combined systolic and diastolic CHF (congestive heart failure) ([place]) - Plan: CBC with Differential/Platelet, Comprehensive metabolic panel -euvolemic on exam. Followed by cardiology.   Essential hypertension - Plan: CBC with Differential/Platelet, Comprehensive metabolic panel, Ambulatory referral to Nephrology  Dysthymia -start back on Effexor, he did not like Prozac. Schedule with a counselor.   Orthopnea -Euvolemic on exam.   Anxiety -I will start him back on Effexor. He did not like taking Prozac. Encouraged counseling and I will provide him with a hist of offices to call and schedule   Hypothyroidism, unspecified type - Plan: TSH, T4, free -check thyroid function and follow up. Medication was adjusted in the hospital.   Medication management - Plan: TSH, T4, free  Stage 3 chronic kidney disease, unspecified whether stage 3a or 3b CKD - Plan: CBC with Differential/Platelet, Comprehensive metabolic panel, Ambulatory referral to Nephrology Recheck renal function. Refer to nephrology.   Decreased renal function - Plan: Ambulatory referral to Nephrology

## 2019-06-02 LAB — COMPREHENSIVE METABOLIC PANEL
ALT: 13 IU/L (ref 0–44)
AST: 20 IU/L (ref 0–40)
Albumin/Globulin Ratio: 1.8 (ref 1.2–2.2)
Albumin: 4.4 g/dL (ref 3.7–4.7)
Alkaline Phosphatase: 110 IU/L (ref 39–117)
BUN/Creatinine Ratio: 20 (ref 10–24)
BUN: 41 mg/dL — ABNORMAL HIGH (ref 8–27)
Bilirubin Total: 1.3 mg/dL — ABNORMAL HIGH (ref 0.0–1.2)
CO2: 24 mmol/L (ref 20–29)
Calcium: 9.5 mg/dL (ref 8.6–10.2)
Chloride: 95 mmol/L — ABNORMAL LOW (ref 96–106)
Creatinine, Ser: 2.07 mg/dL — ABNORMAL HIGH (ref 0.76–1.27)
GFR calc Af Amer: 35 mL/min/{1.73_m2} — ABNORMAL LOW (ref >59)
GFR calc non Af Amer: 30 mL/min/{1.73_m2} — ABNORMAL LOW (ref >59)
Globulin, Total: 2.5 g/dL (ref 1.5–4.5)
Glucose: 103 mg/dL — ABNORMAL HIGH (ref 65–99)
Potassium: 5 mmol/L (ref 3.5–5.2)
Sodium: 135 mmol/L (ref 134–144)
Total Protein: 6.9 g/dL (ref 6.0–8.5)

## 2019-06-02 LAB — CBC WITH DIFFERENTIAL/PLATELET
Basophils Absolute: 0.1 10*3/uL (ref 0.0–0.2)
Basos: 0 %
EOS (ABSOLUTE): 0.2 10*3/uL (ref 0.0–0.4)
Eos: 2 %
Hematocrit: 40.6 % (ref 37.5–51.0)
Hemoglobin: 13.8 g/dL (ref 13.0–17.7)
Immature Grans (Abs): 0.1 10*3/uL (ref 0.0–0.1)
Immature Granulocytes: 1 %
Lymphocytes Absolute: 2.1 10*3/uL (ref 0.7–3.1)
Lymphs: 17 %
MCH: 31.7 pg (ref 26.6–33.0)
MCHC: 34 g/dL (ref 31.5–35.7)
MCV: 93 fL (ref 79–97)
Monocytes Absolute: 0.7 10*3/uL (ref 0.1–0.9)
Monocytes: 6 %
Neutrophils Absolute: 9.4 10*3/uL — ABNORMAL HIGH (ref 1.4–7.0)
Neutrophils: 74 %
Platelets: 341 10*3/uL (ref 150–450)
RBC: 4.36 x10E6/uL (ref 4.14–5.80)
RDW: 13.5 % (ref 11.6–15.4)
WBC: 12.6 10*3/uL — ABNORMAL HIGH (ref 3.4–10.8)

## 2019-06-02 LAB — TSH: TSH: 15.8 u[IU]/mL — ABNORMAL HIGH (ref 0.450–4.500)

## 2019-06-02 LAB — T4, FREE: Free T4: 1.36 ng/dL (ref 0.82–1.77)

## 2019-06-02 NOTE — Patient Instructions (Signed)
You can call to schedule your appointment with a counselor. A few offices are listed below for you to call.    [facility] [address] [place], [place] [address]  Phone: [facility]  [place], [place], [place] [address]  Phone: [phone]  [facility] [address] [place], [place] [address] [phone] for appointments   [facility]  [address] [place], [place] [address] [place]  [address] [place]  (across from [facility])  [facility] [address] [place], [place] [address] [phone]

## 2019-06-18 ENCOUNTER — Other Ambulatory Visit: Payer: Self-pay | Admitting: Cardiology

## 2019-06-18 DIAGNOSIS — I472 Ventricular tachycardia, unspecified: Secondary | ICD-10-CM

## 2019-06-20 HISTORY — PX: OTHER SURGICAL HISTORY: SHX169

## 2019-06-21 DIAGNOSIS — N1832 Chronic kidney disease, stage 3b: Secondary | ICD-10-CM | POA: Diagnosis not present

## 2019-06-21 DIAGNOSIS — I5020 Unspecified systolic (congestive) heart failure: Secondary | ICD-10-CM | POA: Diagnosis not present

## 2019-06-22 ENCOUNTER — Other Ambulatory Visit: Payer: Self-pay | Admitting: Nephrology

## 2019-06-22 ENCOUNTER — Ambulatory Visit (INDEPENDENT_AMBULATORY_CARE_PROVIDER_SITE_OTHER): Payer: Self-pay | Admitting: Physician Assistant

## 2019-06-22 ENCOUNTER — Ambulatory Visit (HOSPITAL_COMMUNITY)
Admission: RE | Admit: 2019-06-22 | Discharge: 2019-06-22 | Disposition: A | Discharge status: Home / Self Care | Payer: Medicare Other | Source: Ambulatory Visit | Attending: Vascular Surgery | Admitting: Vascular Surgery

## 2019-06-22 ENCOUNTER — Other Ambulatory Visit: Payer: Self-pay

## 2019-06-22 VITALS — BP 129/74 | HR 73 | Temp 97.5°F | Resp 20 | Ht 74.0 in | Wt 245.0 lb

## 2019-06-22 DIAGNOSIS — I739 Peripheral vascular disease, unspecified: Secondary | ICD-10-CM

## 2019-06-22 DIAGNOSIS — L03119 Cellulitis of unspecified part of limb: Secondary | ICD-10-CM | POA: Diagnosis not present

## 2019-06-22 DIAGNOSIS — N1832 Chronic kidney disease, stage 3b: Secondary | ICD-10-CM

## 2019-06-22 NOTE — Progress Notes (Signed)
Office Note     CC:  follow up Requesting Provider:  Girtha Rm, NP-C  HPI: Leonard [name] is a 74 y.o. (February 05, 1945) male who presents for follow-up bilateral ABIs.  The patient was recently evaluated by Dr. Oneida Alar for lower extremity edema.  He was given a prescription for compression stockings.  He did not have palpable pedal pulses.  His wife accompanies him today.  He is currently ambulating with a rolling walker to a recent fall approximately 1 month ago.  He did not sustain injuries.  He states he does not typically use a walker.  He denies claudication or rest pain.  He denies nonhealing ulcers.  He has had no prior lower extremity vascular procedures.  He has not been wearing compression stockings as they are extremely difficult to apply.  Cardiac history includes PTCA/stent to RCA. AICD. EF estimate 15-20%. On Brilinta Hx of hyperlipidemia, CKD, anaphylaxis to shellfish  Compliant with ASA and statin No tobacco use   Past Medical History:  Diagnosis Date  . AICD (automatic cardioverter/defibrillator) present 08/26/2015   STJ  . Anxiety   . Atherosclerotic heart disease of native coronary artery with angina pectoris ([place]) 07/2015   a. 07/2015 Posterior STEMI/PCI: LM nl, LAd 40ost, RI 40, RCA 154m (3.0x22 Resolute Integrity DES distal, 3.0x12 Resolute Integrity DES prox), 90d (PTCA), RPDA small, nl, RPLB2 100 - too small for PTCA. -- patent in f/u cath 08/2015 & 05/2016 - patent RCA stents (~10%), RI 40%, LM ~30% ost LAD ~40%.  . Chronic combined systolic and diastolic CHF (congestive heart failure) ([place]) 07/2015   a. 07/2015 Ehco: EF 45-50%, basal-mid inf and infsept AK, inflat, apical inf, and apical septal HK, Gr1 DD, triv MR.;; b - Echo 05/2016 -> EF 20-25%, Gr 2 DD.  Severe diffuse HK disproportionately severe HK and thinning of apex, anterior septum and anterior wall-with septal dyssynchrony.  PAP ~53 mmHg.  [name] Hyperlipidemia with target low density lipoprotein (LDL)  cholesterol less than 70 mg/dL   . Hypertensive heart disease   . Ischemic cardiomyopathy 07/2015 - 05/2016   a. 07/2015 Echo: EF 45-50%.; Follow-up Echo 08/23/2015: EF 50-55% with mild inferior hypokinesis --> 05/2016: EF 20-25%, diffuse HK. PAP ~53 mmHg.  . Kidney disease, chronic, stage [name] (GFR 30-59 ml/min) 08/12/2015  . Morbid obesity ([place])   . ST elevation myocardial infarction (STEMI) of inferior wall ([place]) 08/12/2015   Subacute presentation for inferior MI with post infarction angina on the following day. EKG still shows injury current. Therefore decided to proceed with intervention of 100% mRCA thrombotic lesion.  . Sustained ventricular fibrillation ([place]) 08/22/2015   Underwent EP evaluation with final ICD implantation.    Past Surgical History:  Procedure Laterality Date  . CARDIAC CATHETERIZATION N/A 08/12/2015   Procedure: Left Heart Cath and Coronary Angiography;  Surgeon: Leonie Man, MD;  Location: [facility];  Service: Cardiovascular: Inferior STEMI: 100% mid RCA, 90% distal RCA, 100% RPL. 40% ostial and proximal LAD and branch of ramus.  [name] CARDIAC CATHETERIZATION N/A 08/26/2015   Procedure: Left Heart Cath and Coronary Angiography;  Surgeon: Belva Crome, MD;  Location: [facility] [place] [facility];  Service: Cardiovascular: To evaluate sustained VT. Widely patent RCA stent and distal PTCA site. Also patent RPL branch. LAD lesion appeared to be more consistent with 50-55% and 40%. Otherwise stable from previous cath. EF estimated 35-45%.  [name] CARDIOVERSION N/A 08/23/2015   Procedure: CARDIOVERSION;  Surgeon: Deboraha Sprang, MD;  Location:  [facility];  Service: Cardiovascular;  Laterality: N/A;  . CORONARY ANGIOPLASTY WITH STENT PLACEMENT  08/12/2015   Mid RCA 100% reduced to 0% with 2 overlapping Resolute DES.  3.0 x 22 mm with a 3.0 x 12 mm prox overlarp (postdilated to 3.4 mm); PTCA of dRCA 90%. (Very dificult,complex case - tortuous, [place] RCA - unable to advance longer  stents.  RPL2 noted to have thromboembolic 829% occlusion - unable to reach.  . ELECTROPHYSIOLOGIC STUDY N/A 08/26/2015   Procedure: Electrophysiology Study;  Surgeon: Deboraha Sprang, MD;  Location: [facility];  Service: Cardiovascular;  Laterality: N/A;  . EP IMPLANTABLE DEVICE N/A 08/26/2015   Procedure: ICD Implant;  Surgeon: Deboraha Sprang, MD;  Location: [facility];  Service: Cardiovascular;  Laterality: LEFT:  St Jude ICD, serial number [id number].   [name] HEMORRHOID SURGERY    . RIGHT/LEFT HEART CATH AND CORONARY ANGIOGRAPHY N/A 06/10/2016   Procedure: Right/Left Heart Cath and Coronary Angiography;  Surgeon: Martinique, Peter M, MD;  [facility] IN[facility]:  Nonobstructive CAD. RCA stents PATENT.  Ost-Prox RCA, 10 %. Ost LM 30 %. Ost LAD 40 %. RI, 40 %; High PCWP  & LVEDP (23 mmHg) ; PAP 4Apr 02, 2031 mmHg. CO/CI 3.89 / 1/5 (Severely Reduced)  . SKIN SURGERY    . TRANSTHORACIC ECHOCARDIOGRAM  08/13/2015; 08/23/2015   a. Mild concentric LVH. EF 45-50%. Basal-mid inferior and inferoseptal akinesis with hypokinesis of inferolateral and apical inferior wall. 1 DD. ;; b. Technically difficult study. Definity contrast administered.  Compared to a prior echo in 07/2015, the LVEF is slightly higher at 50-55% with mild inferior hypokinesis.  . TRANSTHORACIC ECHOCARDIOGRAM  06/09/2016   EF  20-25%, GR 2 DD. Severe diffuse  hypokinesis with distinct regional wall motion abnormalities.   There is disproportionately severe hypokinesis and thinning of the apex, anterior septum and anterior wall (septal dyssynchrony). However, there is severe dyssynchrony , making it difficult to assess regional function.  PA P ~53 mmHg  . TRANSTHORACIC ECHOCARDIOGRAM  01/2017   Severely reduced EF.  GI to DD.  Moderate MR.  Mild to have a moderately elevated PAP (peak 92mmHg)    Social History   Socioeconomic History  . Marital status: Married    Spouse name: Not on file  . Number of children: Not on file  . Years of  education: Not on file  . Highest education level: Not on file  Occupational History  . Not on file  Tobacco Use  . Smoking status: Never Smoker  . Smokeless tobacco: Never Used  Substance and Sexual Activity  . Alcohol use: Yes    Alcohol/week: 3.0 - 4.0 standard drinks    Types: 3 - 4 Shots of liquor per week    Comment: 3-4 glasses of scotch 3-4 days per week  . Drug use: No  . Sexual activity: Not on file  Other Topics Concern  . Not on file  Social History Narrative   He tells me that his daughter died on Halloween April 21, 2015. She had a diving accident at the age of 60 and been quadriplegic for 25 years. She had been incredibly vigorous girl. He showed me her high school graduation picture.She was as he reported beautiful.  Her name was [name].   Social Determinants of Health   Financial Resource Strain:   . Difficulty of Paying Living Expenses:   Food Insecurity:   . Worried About Charity fundraiser in the Last Year:   .  Ran Out of Food in the Last Year:   Transportation Needs:   . Film/video editor (Medical):   [name] Lack of Transportation (Non-Medical):   Physical Activity:   . Days of Exercise per Week:   . Minutes of Exercise per Session:   Stress:   . Feeling of Stress :   Social Connections:   . Frequency of Communication with Friends and Family:   . Frequency of Social Gatherings with Friends and Family:   . Attends Religious Services:   . Active Member of Clubs or Organizations:   . Attends Archivist Meetings:   [name] Marital Status:   Intimate Partner Violence:   . Fear of Current or Ex-Partner:   . Emotionally Abused:   [name] Physically Abused:   . Sexually Abused:    Family History  Problem Relation Age of Onset  . Diabetes Mother   . Heart disease Paternal Grandmother   . Heart disease Paternal Grandfather     Current Outpatient Medications  Medication Sig Dispense Refill  . acetaminophen (TYLENOL) 325 MG tablet Take 2 tablets (650 mg total) by  mouth every 4 (four) hours as needed for headache or mild pain.    [name] amiodarone (PACERONE) 200 MG tablet Take 1.5 tablets (300 mg total) by mouth daily. 180 tablet 3  . aspirin EC 81 MG tablet Take 1 tablet (81 mg total) by mouth daily.    [name] atorvastatin (LIPITOR) 80 MG tablet TAKE 1 TABLET BY MOUTH DAILY AT 6 PM; MAKE ANNUAL APPOINTMENT WITH DOCTOR KLEIN FOR FUTURE REFILLS 90 tablet 3  . BRILINTA 90 MG TABS tablet Take 90 mg by mouth 2 (two) times daily.    . Cholecalciferol (VITAMIN D) 50 MCG (2000 UT) CAPS Take 0.0005 capsules (1 Units total) by mouth daily. 30 capsule 1  . Coenzyme Q10 (COQ10) 200 MG CAPS Take 200 mg by mouth daily.    . digoxin (LANOXIN) 0.125 MG tablet Take 125 mcg by mouth daily.    . folic acid (FOLVITE) 1 MG tablet Take 1 tablet (1 mg total) by mouth daily. 60 tablet 0  . furosemide (LASIX) 40 MG tablet Take 40 mg by mouth daily.    . hydrocerin (EUCERIN) CREA Apply 1 application topically 2 (two) times daily. 113 g 0  . levothyroxine (SYNTHROID) 25 MCG tablet Take 1 tablet (25 mcg total) by mouth daily before breakfast. 30 tablet 3  . MAGNESIUM-OXIDE 400 (241.3 Mg) MG tablet TAKE 1 TABLET BY MOUTH TWICE DAILY 60 tablet 3  . metoprolol succinate (TOPROL-XL) 25 MG 24 hr tablet Take 0.5 tablets (12.5 mg total) by mouth daily. 15 tablet 0  . Multiple Vitamin (MULTIVITAMIN WITH MINERALS) TABS tablet Take 1 tablet by mouth daily. 60 tablet 0  . spironolactone (ALDACTONE) 25 MG tablet Take 0.5 tablets (12.5 mg total) by mouth daily. 15 tablet 0  . thiamine 100 MG tablet Take 1 tablet (100 mg total) by mouth daily. 60 tablet 0  . ticagrelor (BRILINTA) 90 MG TABS tablet TAKE 1 TABLET BY MOUTH TWICE DAILY(NEEDS OFFICE VISIT FOR REFILLS) 180 tablet 2  . torsemide (DEMADEX) 20 MG tablet Take 1 tablet (20 mg total) by mouth daily. (Patient taking differently: Take 20 mg by mouth daily. Take two tablets daily.) 30 tablet 1  . venlafaxine (EFFEXOR) 75 MG tablet Take 1 tablet (75 mg  total) by mouth daily. 30 tablet 5   No current facility-administered medications for this visit.    Allergies  Allergen Reactions  .  Clams [Shellfish Allergy] Shortness Of Breath, Itching and Swelling  . Shellfish Allergy Anaphylaxis  . Shellfish-Derived Products Shortness Of Breath, Itching and Swelling  . Sulfa Antibiotics Other (See Comments)    unknown  . Other Itching and Other (See Comments)    Ragweed causes watery eyes, itching, sneezing  . Sulfa Antibiotics Other (See Comments)    Pt unsure of reaction, mother told him he was allergic      REVIEW OF SYSTEMS:   [X]  denotes positive finding, [ ]  denotes negative finding Cardiac  Comments:  Chest pain or chest pressure:    Shortness of breath upon exertion:    Short of breath when lying flat:    Irregular heart rhythm:        Vascular    Pain in calf, thigh, or hip brought on by ambulation:    Pain in feet at night that wakes you up from your sleep:     Blood clot in your veins:    Leg swelling:  x       Pulmonary    Oxygen at home:    Productive cough:     Wheezing:         Neurologic    Sudden weakness in arms or legs:     Sudden numbness in arms or legs:     Sudden onset of difficulty speaking or slurred speech:    Temporary loss of vision in one eye:     Problems with dizziness:         Gastrointestinal    Blood in stool:     Vomited blood:         Genitourinary    Burning when urinating:     Blood in urine:        Psychiatric    Major depression:         Hematologic    Bleeding problems:    Problems with blood clotting too easily:        Skin    Rashes or ulcers:        Constitutional    Fever or chills:      PHYSICAL EXAMINATION:    General:  WDWN in NAD; vital signs documented above Gait: Not observed HENT: WNL, normocephalic Pulmonary: normal non-labored breathing , without Rales, rhonchi,  wheezing Cardiac: regular HR, without  Murmurs without carotid bruit Abdomen: soft,  NT, no masses Skin: with rashes.  Patient has 2 tiny skin tear of each lower leg with oozing secondary to anticoagulation. Vascular Exam/Pulses: 2+ palpable brachial, radial and femoral pulses bilaterally.  Biphasic Doppler signals bilaterally of the DP, PT and peroneal arteries. Extremities: with chronic ischemic changes with loss of hair and shiny skin of the lower legs and feet, without Gangrene , without cellulitis; without open wounds; no significant edema today Musculoskeletal: Mild lower extremity muscle wasting /atrophy  Neurologic: A&O X 3;  No focal weakness or paresthesias are detected Psychiatric:  The pt has Normal affect.   Non-Invasive Vascular Imaging:   ABI Findings:  +---------+------------------+-----+----------+--------+  Right  Rt Pressure (mmHg)IndexWaveform Comment   +---------+------------------+-----+----------+--------+  Brachial 129                      +---------+------------------+-----+----------+--------+  ATA   117        0.84            +---------+------------------+-----+----------+--------+  PTA   91        0.65 monophasic      +---------+------------------+-----+----------+--------+  DP                monophasic      +---------+------------------+-----+----------+--------+  Great Toe60        0.43            +---------+------------------+-----+----------+--------+   +---------+------------------+-----+----------+-------+  Left   Lt Pressure (mmHg)IndexWaveform Comment  +---------+------------------+-----+----------+-------+  Brachial 139                      +---------+------------------+-----+----------+-------+  ATA   72        0.52            +---------+------------------+-----+----------+-------+  PTA   88        0.63 monophasic        +---------+------------------+-----+----------+-------+  DP                monophasic      +---------+------------------+-----+----------+-------+  Great Toe36        0.26            +---------+------------------+-----+----------+-------+   +-------+-----------+-----------+------------+------------+  ABI/TBIToday's ABIToday's TBIPrevious ABIPrevious TBI  +-------+-----------+-----------+------------+------------+  Right 0.84    0.43                  +-------+-----------+-----------+------------+------------+  Left  0.63    0.26                  +-------+-----------+-----------+------------+------------+    Summary:  Right: Resting right ankle-brachial index indicates mild right lower  extremity arterial disease. The right toe-brachial index is abnormal.   Left: Resting left ankle-brachial index indicates moderate left lower  extremity arterial disease. The left toe-brachial index is abnormal. LT  Great toe pressure = 36 mmHg.     ASSESSMENT/PLAN:: 74 y.o. male here for follow up for peripheral arterial disease.  ABIs show mild to moderate disease.  He is currently without signs and symptoms of acute ischemia.  No claudication or rest pain.  We discussed the need for follow-up and to seek immediate medical attention if he develops a nonhealing wound, develops lower extremity claudication or rest pain.  Continue aspirin and statin.  Follow-up in 6 months with ABIs  Barbie Banner, PA-C [facility] [phone]  Clinic MD: Oneida Alar

## 2019-06-23 ENCOUNTER — Telehealth: Payer: Self-pay | Admitting: Cardiology

## 2019-06-23 ENCOUNTER — Encounter: Payer: Self-pay | Admitting: Cardiology

## 2019-06-23 ENCOUNTER — Ambulatory Visit (INDEPENDENT_AMBULATORY_CARE_PROVIDER_SITE_OTHER): Payer: Medicare Other | Admitting: Cardiology

## 2019-06-23 VITALS — BP 140/82 | HR 70 | Temp 97.2°F | Ht 72.0 in | Wt 250.8 lb

## 2019-06-23 DIAGNOSIS — E785 Hyperlipidemia, unspecified: Secondary | ICD-10-CM | POA: Diagnosis not present

## 2019-06-23 DIAGNOSIS — I447 Left bundle-branch block, unspecified: Secondary | ICD-10-CM

## 2019-06-23 DIAGNOSIS — Z95810 Presence of automatic (implantable) cardiac defibrillator: Secondary | ICD-10-CM | POA: Diagnosis not present

## 2019-06-23 DIAGNOSIS — I2129 ST elevation (STEMI) myocardial infarction involving other sites: Secondary | ICD-10-CM | POA: Diagnosis not present

## 2019-06-23 DIAGNOSIS — I5042 Chronic combined systolic (congestive) and diastolic (congestive) heart failure: Secondary | ICD-10-CM

## 2019-06-23 DIAGNOSIS — I10 Essential (primary) hypertension: Secondary | ICD-10-CM | POA: Diagnosis not present

## 2019-06-23 DIAGNOSIS — I4901 Ventricular fibrillation: Secondary | ICD-10-CM

## 2019-06-23 DIAGNOSIS — I2510 Atherosclerotic heart disease of native coronary artery without angina pectoris: Secondary | ICD-10-CM

## 2019-06-23 DIAGNOSIS — N1832 Chronic kidney disease, stage 3b: Secondary | ICD-10-CM

## 2019-06-23 DIAGNOSIS — I255 Ischemic cardiomyopathy: Secondary | ICD-10-CM | POA: Diagnosis not present

## 2019-06-23 MED ORDER — TICAGRELOR 60 MG PO TABS: 60.0000 mg | ORAL_TABLET | Freq: Two times a day (BID) | ORAL | 11 refills | Status: DC

## 2019-06-23 MED ORDER — ENTRESTO 24-26 MG PO TABS: 1.0000 | ORAL_TABLET | Freq: Two times a day (BID) | ORAL | 6 refills | Status: DC

## 2019-06-23 NOTE — Telephone Encounter (Signed)
Patient's wife, [name], is requesting to accompany him during appointment scheduled for today, 06/23/19 at 2:40 with Dr. Ellyn Hack. She states patient requires a walker and assistance due to memory issues. Please advise.

## 2019-06-23 NOTE — Telephone Encounter (Signed)
Spoke with patient's spouse per DPR. Patient has memory and mobility issues. Wife will be accompanying him to appointment.

## 2019-06-23 NOTE — Patient Instructions (Addendum)
Medication Instructions:   STOP ASPIRIN   RESTART ENTRESTO  24/26  ONE TABLET TWICE A DAY   COMPLETED BRILINTA 90 MG THEN STOP AND START TAKING BRILINTA 60 MG TWICE A DAY   *If you need a refill on your cardiac medications before your next appointment, please call your pharmacy*   Lab Work: CMP  With magnesium  AFTER 1 TO 2 WEEK  STARTING ENTRESTO   If you have labs (blood work) drawn today and your tests are completely normal, you will receive your results only by:  MyChart Message (if you have MyChart) OR  A paper copy in the mail If you have any lab test that is abnormal or we need to change your treatment, we will call you to review the results.   Testing/Procedures: Not needed    Follow-Up: At [facility], you and your health needs are our priority.  As part of our continuing mission to provide you with exceptional heart care, we have created designated Provider Care Teams.  These Care Teams include your primary Cardiologist (physician) and Advanced Practice Providers (APPs -  Physician Assistants and Nurse Practitioners) who all work together to provide you with the care you need, when you need it.   Your next appointment:   3 month(s) Sept 2021  The format for your next appointment:   In Person  Provider:   Glenetta Hew, MD

## 2019-06-23 NOTE — Progress Notes (Signed)
Primary Care Provider: Girtha Rm, NP-C Cardiologist: Glenetta Hew, MD Electrophysiologist: Virl Axe, MD  Clinic Note: Chief Complaint  Patient presents with   Follow-up    60-month   Coronary Artery Disease    No angina   Congestive Heart Failure    Seems to be euvolemic.   HPI:    Leonard [name] is a 74 y.o. male with a complex cardiac history noted below who presents today for follow-up.  I have not seen him since February 2019.  CARDIAC HISTORY  July 2017-History large inferior MI complicated by VT: CAD with PCI, to the RCA and PTCA to RPDA; initial EF 45 to 50%.  Ischemic cardiomyopathy, sustained VT -> EPS with PMVT--Saint Jude ICD (followed by Dr. Caryl Comes); he has underlying left bundle branch block  Jun 09, 2016-ACUTE ON CHRONIC COMBINED SYSTOLIC AND DIASTOLIC HEART FAILURE (reduced EF with R WMA) -> thought to have been triggered by alcohol use.  05/2016 -- Repeat cardiac catheterization revealed patent RCA stents   Echo 05/2016 revealing EF of 20% to 25% with grade 2 diastolic dysfunction with severe diffuse hypokinesis disproportionately severe hypokinesis and thinning of the apex anterior septum anterior wall and septal dyssynchrony with EPAP of 53 mmHg.  Has been followed by Dr. Haroldine Laws  Echo January 2019-EF 15 to 20%.  Severely reduced EF.  GRII DD.  Moderate MR.  Mild to moderate the elevated PA P.  02/23/2018-hospitalized for ACUTE ON CHRONIC COMBINED HEART FAILURE/BIV FAILURE  He was diuresed and placed on p.o. torsemide, he was taken off of digoxin and Entresto due to elevated Cr along with hypotension.    No beta-blocker due to low output; cellulitis treated with Keflex. He was to limit free water in the setting of hyponatremia.Discharge weight 265.9 (120.9 kg)  Despite hospitalization and follow-up with research team, he had not been seen until November 2020 by Tommye Standard, PA -> ambulating at home with a cane.  Multiple orthopedic issues.   Unable to stand with assistance.  Markedly weeping lower extremity edema.  Has some baseline orthopnea.  (Was no longer in the clinical trial) still drinking 5-6 drinks of scotch a night. -->  Was apparently on Entresto beta-blocker furosemide and Aldactone only taking a few days a week.  Also was on digoxin. ->   Converted from furosemide to torsemide 80 mg twice daily, Spironolactone increased to 25 mg daily.    Was treated with Keflex for possible cellulitis.  Told to reduce alcohol intake  January 19, 2019-Dr. Caryl Comes: Noted that he is feeling better mild dyspnea, but quite active.  "No peripheral edema "--lost 20 to 25 pounds..   Decided to defer conversion to CRT-D until Covid concerns are over. ->  Recommend using resting elliptical trainer.  Leonard [name] was last seen here at this office on March 06, 2019 by Jory Sims, NP. -->  She noted that he appeared quite frail but with no major complaints except lack of energy.  His weight is been followed by his wife who is trying to ensure medical compliance.  Patient himself was concerned about going back to the hospital.  He had been quite depressed and PCP and started Prozac (but he was already on Effexor).  Expressed desire to return to cardiac rehab to regain stamina.  Weights were stable.  BP low normal.  Dr. Caryl Comes saw him back on March 24.  Noted that he was doing relatively well with no real breathing limitations.  Was maintaining stable weights.  No lightheadedness upon standing despite relatively low blood pressures.  Suggestive the functional status was improved, and was euvolemic declined CRT-D upgrade.    Recent Hospitalizations: None  Reviewed  CV studies:    The following studies were reviewed today: (if available, images/films reviewed: From Epic Chart or Care Everywhere)  December 18, 2018-Echo: EF 15-20%.  Mild dilation.  Global HK.  Moderate RV enlargement with mildly reduced function.  Severe biatrial  enlargement.  Moderate pleural effusion.  Moderate MAP:.  Moderate aortic sclerosis.  Mild ascending aortic dilation-41 mm.  Increased CVP estimated 15 mmHg.  05/18/2019-Lower Venous Reflux Study.:  No evidence of obvious DVT bilaterally.  Reflux noted in Right CFV, GSV in the thigh as well as calf, as well as SSV.  Venous reflux noted in the Left GSV in the thigh, but no evidence in SSV or CFV.  06/22/2019-Lower Extremity Arterial Doppler: Right-mild lower extremity arterial disease with abnormal TBI.  Moderate Left PAD-abnormal TBI.   Interval History:   Leonard [name] is here today for routine follow-up overall doing better.  He is got persistent lower extremity swelling.  He is sleeping much better though and is having less PND unless he lies totally flat.  With a little bit of pillow support/orthopnea, he does okay.  Overall, neurologically between his mood and personality he is doing much better on the thiamine and folate.  His weight at home has been stable at roughly 245 pounds (indicating his weight is different than our scale here). Although he has chronic edema, it seems to be relatively stable on his current dose of torsemide.  He has no exacerbation of his orthopnea or PND.  He has baseline exertional dyspnea and is in general not very active.  He tries to walk some with a rolling walker, but is very limited because of knee hip and back pain. He has not had any chest pain or pressure with rest or exertion.  He may get little lightheaded when he stands up, but no syncope/near syncope or TIA/amaurosis fugax.  CV Review of Symptoms (Summary) Cardiovascular ROS: positive for - dyspnea on exertion, edema, orthopnea, paroxysmal nocturnal dyspnea and All relatively stable negative for - chest pain, irregular heartbeat, palpitations, rapid heart rate or Syncope/near syncope, TIA/amaurosis fugax, claudication   Interestingly, when I review the medicines that he supposedly is on, he is not  currently on Entresto.  He is back on digoxin.  He is on amiodarone with history of polymorphic VT.  No bleeding issues on Brilinta plus aspirin.  The patient does not have symptoms concerning for COVID-19 infection (fever, chills, cough, or new shortness of breath).  The patient is practicing social distancing & Masking.  Immunization History  Administered Date(s) Administered   Fluad Quad(high Dose 65+) 09/16/2018   Influenza, High Dose Seasonal PF 10/14/2015, 10/06/2016, 10/20/2017   Influenza-Unspecified 10/20/2014   PFIZER SARS-COV-2 Vaccination 03/15/2019, 04/13/2019   Pneumococcal Conjugate-13 10/06/2016   Pneumococcal Polysaccharide-23 07/21/2012, 08/13/2015   Td 05/20/2007, 08/26/2010   Tdap 02/12/2015   Zoster 07/21/2012   Zoster Recombinat (Shingrix) 06/22/2018, 09/16/2018    REVIEWED OF SYSTEMS   Review of Systems  Constitutional: Positive for malaise/fatigue.  HENT: Negative for congestion and nosebleeds.   Respiratory: Positive for shortness of breath. Negative for cough.   Cardiovascular: Negative for claudication.  Gastrointestinal: Negative for abdominal pain, blood in stool and melena.  Genitourinary: Positive for frequency (With diuretic). Negative for hematuria.  Musculoskeletal: Positive for joint pain. Negative for falls.  Neurological:  Positive for tremors (Neuropathy), focal weakness (Legs from hips down) and headaches. Negative for dizziness.  Psychiatric/Behavioral: Positive for depression (Mood seems to be little more stable.  Is on Effexor.) and memory loss. Negative for substance abuse (Wife seems to say that his alcohol intake has reduced.). The patient is nervous/anxious and has insomnia.    I have reviewed and (if needed) personally updated the patient's problem list, medications, allergies, past medical and surgical history, social and family history.   PAST MEDICAL HISTORY   Past Medical History:  Diagnosis Date   AICD (automatic  cardioverter/defibrillator) present 08/26/2015   STJ   Anxiety    Atherosclerotic heart disease of native coronary artery with angina pectoris ([place]) 07/2015   a. 07/2015 Posterior STEMI/PCI: LM nl, LAd 40ost, RI 40, RCA 116m (3.0x22 Resolute Integrity DES distal, 3.0x12 Resolute Integrity DES prox), 90d (PTCA), RPDA small, nl, RPLB2 100 - too small for PTCA. -- patent in f/u cath 08/2015 & 05/2016 - patent RCA stents (~10%), RI 40%, LM ~30% ost LAD ~40%.   Chronic combined systolic and diastolic CHF (congestive heart failure) ([place]) 07/2015   a. 07/2015 Ehco: EF 45-50%, basal-mid inf and infsept AK, inflat, apical inf, and apical septal HK, Gr1 DD, triv MR.;; b - Echo 05/2016 -> EF 20-25%, Gr 2 DD.  Severe diffuse HK disproportionately severe HK and thinning of apex, anterior septum and anterior wall-with septal dyssynchrony.  PAP ~53 mmHg.   Hyperlipidemia with target low density lipoprotein (LDL) cholesterol less than 70 mg/dL    Hypertensive heart disease    Ischemic cardiomyopathy 07/2015 - 05/2016   a. 07/2015 Echo: EF 45-50%.; Follow-up Echo 08/23/2015: EF 50-55% with mild inferior hypokinesis --> 05/2016: EF 20-25%, diffuse HK. PAP ~53 mmHg.   Kidney disease, chronic, stage [name] (GFR 30-59 ml/min) 08/12/2015   Morbid obesity ([place])    ST elevation myocardial infarction (STEMI) of inferior wall ([place]) 08/12/2015   Subacute presentation for inferior MI with post infarction angina on the following day. EKG still shows injury current. Therefore decided to proceed with intervention of 100% mRCA thrombotic lesion.   Sustained ventricular fibrillation ([place]) 08/22/2015   Underwent EP evaluation with final ICD implantation.    PAST SURGICAL HISTORY   Past Surgical History:  Procedure Laterality Date   CARDIAC CATHETERIZATION N/A 08/12/2015   Procedure: Left Heart Cath and Coronary Angiography;  Surgeon: Leonie Man, MD;  Location: [facility];  Service: Cardiovascular: Inferior STEMI:  100% mid RCA, 90% distal RCA, 100% RPL. 40% ostial and proximal LAD and branch of ramus.   CARDIAC CATHETERIZATION N/A 08/26/2015   Procedure: Left Heart Cath and Coronary Angiography;  Surgeon: Belva Crome, MD;  Location: [facility];  Service: Cardiovascular: To evaluate sustained VT. Widely patent RCA stent and distal PTCA site. Also patent RPL branch. LAD lesion appeared to be more consistent with 50-55% and 40%. Otherwise stable from previous cath. EF estimated 35-45%.   CARDIOVERSION N/A 08/23/2015   Procedure: CARDIOVERSION;  Surgeon: Deboraha Sprang, MD;  Location: [facility];  Service: Cardiovascular;  Laterality: N/A;   CORONARY ANGIOPLASTY WITH STENT PLACEMENT  08/12/2015   Mid RCA 100% reduced to 0% with 2 overlapping Resolute DES.  3.0 x 22 mm with a 3.0 x 12 mm prox overlarp (postdilated to 3.4 mm); PTCA of dRCA 90%. (Very dificult,complex case - tortuous, [place] RCA - unable to advance longer stents.  RPL2 noted to have thromboembolic 161% occlusion - unable to reach.  ELECTROPHYSIOLOGIC STUDY N/A 08/26/2015   Procedure: Electrophysiology Study;  Surgeon: Deboraha Sprang, MD;  Location: [facility];  Service: Cardiovascular;  Laterality: N/A;   EP IMPLANTABLE DEVICE N/A 08/26/2015   Procedure: ICD Implant;  Surgeon: Deboraha Sprang, MD;  Location: [facility];  Service: Cardiovascular;  Laterality: LEFT:  St Jude ICD, serial number [id number].    HEMORRHOID SURGERY     RIGHT/LEFT HEART CATH AND CORONARY ANGIOGRAPHY N/A 06/10/2016   Procedure: Right/Left Heart Cath and Coronary Angiography;  Surgeon: Martinique, Peter M, MD;  Location: [facility] IN[facility]AS[facility]:  Nonobstructive CAD. RCA stents PATENT.  Ost-Prox RCA, 10 %. Ost LM 30 %. Ost LAD 40 %. RI, 40 %; High PCWP  & LVEDP (23 mmHg) ; PAP 43/23/31 mmHg. CO/CI 3.89 / 1/5 (Severely Reduced)   SKIN SURGERY     TRANSTHORACIC ECHOCARDIOGRAM  08/13/2015; 08/23/2015   a. Mild concentric LVH. EF 45-50%. Basal-mid inferior and  inferoseptal akinesis with hypokinesis of inferolateral and apical inferior wall. 1 DD. ;; b. Technically difficult study. Definity contrast administered.  Compared to a prior echo in 07/2015, the LVEF is slightly higher at 50-55% with mild inferior hypokinesis.   TRANSTHORACIC ECHOCARDIOGRAM  06/09/2016   EF  20-25%, GR 2 DD. Severe diffuse  hypokinesis with distinct regional wall motion abnormalities.   There is disproportionately severe hypokinesis and thinning of the apex, anterior septum and anterior wall (septal dyssynchrony). However, there is severe dyssynchrony , making it difficult to assess regional function.  PA P ~53 mmHg   TRANSTHORACIC ECHOCARDIOGRAM  01/2017   Severely reduced EF.  GI to DD.  Moderate MR.  Mild to have a moderately elevated PAP (peak 83mmHg)    MEDICATIONS/ALLERGIES   Current Meds  Medication Sig   acetaminophen (TYLENOL) 325 MG tablet Take 2 tablets (650 mg total) by mouth every 4 (four) hours as needed for headache or mild pain.   amiodarone (PACERONE) 200 MG tablet Take 1.5 tablets (300 mg total) by mouth daily.   atorvastatin (LIPITOR) 80 MG tablet TAKE 1 TABLET BY MOUTH DAILY AT 6 PM; MAKE ANNUAL APPOINTMENT WITH DOCTOR KLEIN FOR FUTURE REFILLS   Cholecalciferol (VITAMIN D) 50 MCG (2000 UT) CAPS Take 0.0005 capsules (1 Units total) by mouth daily.   Coenzyme Q10 (COQ10) 200 MG CAPS Take 200 mg by mouth daily.   digoxin (LANOXIN) 0.125 MG tablet Take 125 mcg by mouth daily.   folic acid (FOLVITE) 1 MG tablet Take 1 tablet (1 mg total) by mouth daily.   furosemide (LASIX) 40 MG tablet Take 40 mg by mouth daily.   hydrocerin (EUCERIN) CREA Apply 1 application topically 2 (two) times daily.   metoprolol succinate (TOPROL-XL) 25 MG 24 hr tablet Take 0.5 tablets (12.5 mg total) by mouth daily.   Multiple Vitamins-Minerals (MULTIVITAMIN ADULT EXTRA C PO) TAKE 1 TABLET BY MOUTH DAILY   thiamine 100 MG tablet Take 1 tablet (100 mg total) by mouth daily.    torsemide (DEMADEX) 20 MG tablet Take 1 tablet (20 mg total) by mouth daily. (Patient taking differently: Take 20 mg by mouth daily. Take two tablets daily.)   venlafaxine (EFFEXOR) 75 MG tablet Take 1 tablet (75 mg total) by mouth daily.   [DISCONTINUED] aspirin EC 81 MG tablet Take 1 tablet (81 mg total) by mouth daily.   [DISCONTINUED] BRILINTA 90 MG TABS tablet Take 90 mg by mouth 2 (two) times daily.   [DISCONTINUED] levothyroxine (SYNTHROID) 25 MCG tablet Take 1  tablet (25 mcg total) by mouth daily before breakfast.    Allergies  Allergen Reactions   Clams [Shellfish Allergy] Shortness Of Breath, Itching and Swelling   Shellfish Allergy Anaphylaxis   Shellfish-Derived Products Shortness Of Breath, Itching and Swelling   Sulfa Antibiotics Other (See Comments)    unknown   Other Itching and Other (See Comments)    Ragweed causes watery eyes, itching, sneezing   Sulfa Antibiotics Other (See Comments)    Pt unsure of reaction, mother told him he was allergic     SOCIAL HISTORY/FAMILY HISTORY   Reviewed in Epic:  Pertinent findings: His daughter died Halloween 05-07-15 (long-term quadriplegic after diving accident)   OBJCTIVE -PE, EKG, labs   Wt Readings from Last 3 Encounters:  06/28/19 252 lb 9.6 oz (114.6 kg)  06/23/19 250 lb 12.8 oz (113.8 kg)  06/22/19 245 lb (111.1 kg)    Physical Exam: BP 140/82    Pulse 70    Temp (!) 97.2 F (36.2 C)    Ht 6' (1.829 m)    Wt 250 lb 12.8 oz (113.8 kg)    SpO2 98%    BMI 34.01 kg/m  Physical Exam Neck:     Vascular: JVD (7-8 cmH2O) present. No carotid bruit or hepatojugular reflux.  Cardiovascular:     Rate and Rhythm: Regular rhythm. Occasional extrasystoles are present.    Chest Wall: PMI is not displaced (Difficult to palpate, but does seem to be somewhat laterally displaced, sustained.).     Pulses: Decreased pulses (Barely able to palpate pedal pulses.  Radial pulses are intact).     Heart sounds: S1 normal and S2  normal. Heart sounds are distant. Murmur heard.  Medium-pitched harsh crescendo-decrescendo early systolic murmur is present with a grade of 2/6 at the upper right sternal border radiating to the neck.  Gallop present. S4 (Versus split S2) sounds present.   Pulmonary:     Effort: No respiratory distress.     Comments: Diminished breath sounds throughout, but nonlabored.  Mild crackles but no rales or rhonchi. Chest:     Chest wall: No tenderness.  Abdominal:     General: Bowel sounds are normal. There is no distension.     Tenderness: There is no abdominal tenderness.     Comments: Obese.  Unable to assess any HSM.  No ascites.  Musculoskeletal:        General: Swelling (Bilateral edema R>L -2-3+ to the knees and thighs.) present.     Cervical back: Normal range of motion and neck supple.  Skin:    General: Skin is warm and dry.     Comments: Diffuse brawny venous stasis changes  Neurological:     General: No focal deficit present.     Mental Status: He is oriented to person, place, and time.     Motor: Weakness (Bilateral legs) present.     Gait: Gait abnormal (Uses rolling walker).  Psychiatric:        Behavior: Behavior normal.        Judgment: Judgment normal.     Comments: He has helped not a great historian.  Wife provides much of the history.  Mood seems to be pretty stable.      Adult ECG Report  Rate: 70;  Rhythm: normal sinus rhythm and 1 degree AVB.  Right axis deviation.  Nonspecific IVCD/LBBB.;   Narrative Interpretation: Stable EKG.  Recent Labs: No recent lipid labs.. Lab Results  Component Value Date   CHOL 170  09/22/2017   HDL 44 09/22/2017   LDLCALC 80 09/22/2017   TRIG 229 (H) 09/22/2017   CHOLHDL 3.9 09/22/2017   Lab Results  Component Value Date   CREATININE 2.07 (H) 06/01/2019   BUN 41 (H) 06/01/2019   NA 135 06/01/2019   K 5.0 06/01/2019   CL 95 (L) 06/01/2019   CO2 24 06/01/2019   Lab Results  Component Value Date   CREATININE 2.07 (H)  06/01/2019   CREATININE 2.02 (H) 05/23/2019   CREATININE 2.41 (H) 05/22/2019    Lab Results  Component Value Date   TSH 11.400 (H) 06/28/2019    ASSESSMENT/PLAN    Problem List Items Addressed This Visit    ST elevation myocardial infarction (STEMI) of true posterior wall, subsequent episode of care ([place]) (Chronic)    Interestingly, the original infarct size was relatively significant but the EF was actually pretty good at 40 to 45%.  However, she ended up having cardioversion and IVC implant in August 2017.  Most recent cath showed patent RCA stents. I do not think the cardiomyopathy is simply because of the major infarct, it is probably more related to left bundle branch block.      Relevant Medications   sacubitril-valsartan (ENTRESTO) 24-26 MG   Other Relevant Orders   EKG 12-Lead (Completed)   Comprehensive metabolic panel   Magnesium   Essential hypertension (Chronic)    Blood pressure is like a little high today.  I think we can restart Entresto.  Need to follow renal function closely.      Relevant Medications   sacubitril-valsartan (ENTRESTO) 24-26 MG   Hyperlipidemia LDL goal <70 (Chronic)    Unfortunately, I do not have any labs on him.  If not checked by PCP prior to next follow-up, we will check lipid panel.      Relevant Medications   sacubitril-valsartan (ENTRESTO) 24-26 MG   Ischemic cardiomyopathy (Chronic)    Again, EF at the time of MI was 45 to 50% and shortly afterwards 50 to 55%.  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  Has ICD placement, I truly think that he would benefit from CRT-D--this has been delayed because of concerns with COVID-19.  I personally think that EF would likely improve because there is no other reason for the EF to be down.  I am restarting Entresto.  Checking temperature panel in roughly 1 week.  I do expect the creatinine to go up a little  bit, but this should be acceptable provided he still making urine.  He is on standing dose of torsemide, we discussed sliding scale.        Relevant Medications   sacubitril-valsartan (ENTRESTO) 24-26 MG   Other Relevant Orders   EKG 12-Lead (Completed)   Comprehensive metabolic panel   Magnesium   Chronic combined systolic and diastolic CHF (congestive heart failure) (HCC) - Primary (Chronic)    Baseline class II-[name] symptoms.  He has extremely significant lower extremity edema which is probably as much related to venous reflux as it is due to CHF.  He has orthopnea, and when sleeps up a little bit, has no real PND.  Plan:   Continue torsemide at current dose with sliding scale additional doses for weight gain greater than 3 pounds.  Continue spironolactone --currently at 12.5 mg, would like to go titrate further.  However, this has been delayed because of hypotension issues along with renal  insufficiency.  I am restarting Entresto along with low-dose Toprol.  Toprol is less important with him being on amiodarone.  Continue digoxin.  We will need to intermittently follow digoxin levels.      Relevant Medications   sacubitril-valsartan (ENTRESTO) 24-26 MG   Other Relevant Orders   EKG 12-Lead (Completed)   Comprehensive metabolic panel   Magnesium   Sustained ventricular fibrillation (HCC) (Chronic)    Status post ICD placement.  Is also maintained on amiodarone.  No further episodes documented.  Would hope that the plan is to continue with BiV ICD/CRT-D--being deferred because of Covid      Relevant Medications   sacubitril-valsartan (ENTRESTO) 24-26 MG   S/P implantation of automatic cardioverter/defibrillator (AICD) (Chronic)    He has an RV lead ICD, despite having broad IVCD/LBBB on EKG and significantly reduced EF, CRT-D has been deferred.  Strongly recommend reconsidering deferring too long as I do think BiV ICD has a chance of improving EF.        CKD (chronic  kidney disease) stage 3, GFR 30-59 ml/min (Chronic)    Closely monitor renal function with him being on amiodarone and digoxin.  If it worsens, we will need to stop digoxin.  He is also on spironolactone and torsemide.  Restarting Delene Loll will likely cause slight increase in creatinine.      RESOLVED: CAD (coronary artery disease) (Chronic)   Relevant Medications   sacubitril-valsartan (ENTRESTO) 24-26 MG   Other Relevant Orders   EKG 12-Lead (Completed)   Comprehensive metabolic panel   Magnesium   LBBB (left bundle branch block)   Relevant Medications   sacubitril-valsartan (ENTRESTO) 24-26 MG      He will need routine testing for amiodarone and digoxin--thyroid levels, LFTs and PFTs as well as digoxin level.  COVID-19 Education: The signs and symptoms of COVID-19 were discussed with the patient and how to seek care for testing (follow up with PCP or arrange E-visit).   The importance of social distancing and COVID-19 vaccination was discussed today.  I spent a total of 24 minutes with the patient. >  50% of the time was spent in direct patient consultation.  Additional time spent with chart review  / charting (studies, outside notes, etc): 22 --> I have not seen the patient in close to 2 years.  He has had hospitalizations and multiple clinic visits that were all reviewed including studies etc. Total Time: 46 min   Current medicines are reviewed at length with the patient today.  (+/- concerns) none  Notice: This dictation was prepared with Dragon dictation along with smaller phrase technology. Any transcriptional errors that result from this process are unintentional and may not be corrected upon review.  Patient Instructions / Medication Changes & Studies & Tests Ordered   Patient Instructions  Medication Instructions:   STOP ASPIRIN   RESTART ENTRESTO  24/26  ONE TABLET TWICE A DAY   COMPLETED BRILINTA 90 MG THEN STOP AND START TAKING BRILINTA 60 MG TWICE A DAY    *If you need a refill on your cardiac medications before your next appointment, please call your pharmacy*   Lab Work: CMP  With magnesium  AFTER 1 TO 2 WEEK  STARTING ENTRESTO   If you have labs (blood work) drawn today and your tests are completely normal, you will receive your results only by:  MyChart Message (if you have MyChart) OR  A paper copy in the mail If you have any lab test that is abnormal  or we need to change your treatment, we will call you to review the results.   Testing/Procedures: Not needed    Follow-Up: At [facility], you and your health needs are our priority.  As part of our continuing mission to provide you with exceptional heart care, we have created designated Provider Care Teams.  These Care Teams include your primary Cardiologist (physician) and Advanced Practice Providers (APPs -  Physician Assistants and Nurse Practitioners) who all work together to provide you with the care you need, when you need it.   Your next appointment:   3 month(s) Sept 2021  The format for your next appointment:   In Person  Provider:   Glenetta Hew, MD        Studies Ordered:   Orders Placed This Encounter  Procedures   Comprehensive metabolic panel   Magnesium   EKG 12-Lead     Glenetta Hew, M.D., M.S. Interventional Cardiologist   Pager # [phone] Phone # [phone] [address]. [place], [place] [address]   Thank you for choosing [facility]!!

## 2019-06-26 ENCOUNTER — Ambulatory Visit: Payer: Medicare Other | Admitting: Cardiology

## 2019-06-27 ENCOUNTER — Other Ambulatory Visit: Payer: Self-pay | Admitting: *Deleted

## 2019-06-27 DIAGNOSIS — I739 Peripheral vascular disease, unspecified: Secondary | ICD-10-CM

## 2019-06-28 ENCOUNTER — Ambulatory Visit (INDEPENDENT_AMBULATORY_CARE_PROVIDER_SITE_OTHER): Payer: Medicare Other | Admitting: Family Medicine

## 2019-06-28 ENCOUNTER — Encounter: Payer: Self-pay | Admitting: Family Medicine

## 2019-06-28 ENCOUNTER — Other Ambulatory Visit: Payer: Self-pay

## 2019-06-28 VITALS — BP 110/60 | HR 64 | Wt 252.6 lb

## 2019-06-28 DIAGNOSIS — E039 Hypothyroidism, unspecified: Secondary | ICD-10-CM | POA: Diagnosis not present

## 2019-06-28 DIAGNOSIS — F32A Depression, unspecified: Secondary | ICD-10-CM

## 2019-06-28 DIAGNOSIS — N1832 Chronic kidney disease, stage 3b: Secondary | ICD-10-CM

## 2019-06-28 DIAGNOSIS — I5042 Chronic combined systolic (congestive) and diastolic (congestive) heart failure: Secondary | ICD-10-CM

## 2019-06-28 DIAGNOSIS — I255 Ischemic cardiomyopathy: Secondary | ICD-10-CM | POA: Diagnosis not present

## 2019-06-28 DIAGNOSIS — F329 Major depressive disorder, single episode, unspecified: Secondary | ICD-10-CM

## 2019-06-28 DIAGNOSIS — Z79899 Other long term (current) drug therapy: Secondary | ICD-10-CM

## 2019-06-28 NOTE — Patient Instructions (Signed)
Look for ways to keep busy and stimulate your brain function.  I think getting back into cardiac rehab will be very beneficial.  Make sure you are taking your levothyroxine on an empty stomach in the mornings.  I will be in touch with your lab results.

## 2019-06-28 NOTE — Progress Notes (Signed)
   Subjective:    Patient ID: Leonard [name], male    DOB: 08-19-1945, 74 y.o.   MRN: [id number]  HPI Chief Complaint  Patient presents with  . 4 week follow-up    4 week follow-up, doing well. no cocerns   He is here with his wife for 4-week follow-up to discuss depression and hypothyroidism.  He started on Effexor and seems to be doing well.  He declines seeing a counselor and states he has several good friends that he enjoys talking to. He is also enjoying reading a good book. States he no longer sits around crying.  Reports that he has not drank any alcohol since our last visit.  No more falls.  He has seen his cardiologist and vein and vascular since his last visit.  He has not been taking levothyroxine on an empty stomach and his wife said she did not know he needs to do this. Discussed that his TSH was significantly elevated at his last visit and we will recheck this today.  Consider adjusting his dose as well.  Denies fever, chills, dizziness, chest pain, palpitations, shortness of breath, abdominal pain, N/V/D.   Reviewed allergies, medications, past medical, surgical, family, and social history.    Review of Systems Pertinent positives and negatives in the history of present illness.     Objective:   Physical Exam BP 110/60   Pulse 64   Wt 252 lb 9.6 oz (114.6 kg)   SpO2 99%   BMI 34.26 kg/m   Alert and oriented in no acute distress.  Not otherwise examined.      Assessment & Plan:  Depression, unspecified depression type -He will continue on Effexor for now.  He declines seeing a counselor but I once again encouraged counseling.  Discussed that boredom may be playing a significant role in his depression.  He plans to start back to cardiac rehab which will allow him to increase his physical stamina as well as be able to interact with others.  He does seem to be a people person and has been more isolated over the past year. He has avoided alcohol and I  encouraged him to continue doing so.  Chronic combined systolic and diastolic CHF (congestive heart failure) (HCC) -No symptoms today.  Followed closely by cardiology.  Stage 3b chronic kidney disease -Reports having a recent visit to nephrology.  States he is due to have an ultrasound of his kidneys.  I will request these records.  Hypothyroidism, unspecified type - Plan: TSH, T4, free -Counseling on taking levothyroxine on empty stomach.  Check thyroid function and follow-up as well as adjust dose as appropriate.  Medication management - Plan: TSH, T4, free I will adjust levothyroxine dose as needed.  He will follow-up in 3 months for a Medicare wellness visit unless we need to adjust his levothyroxine dose and then he will follow-up in 4 weeks for labs.

## 2019-06-29 ENCOUNTER — Encounter: Payer: Self-pay | Admitting: Cardiology

## 2019-06-29 ENCOUNTER — Other Ambulatory Visit: Payer: Self-pay | Admitting: Family Medicine

## 2019-06-29 DIAGNOSIS — E039 Hypothyroidism, unspecified: Secondary | ICD-10-CM

## 2019-06-29 LAB — T4, FREE: Free T4: 1.37 ng/dL (ref 0.82–1.77)

## 2019-06-29 LAB — TSH: TSH: 11.4 u[IU]/mL — ABNORMAL HIGH (ref 0.450–4.500)

## 2019-06-29 MED ORDER — LEVOTHYROXINE SODIUM 50 MCG PO TABS: 50.0000 ug | ORAL_TABLET | Freq: Every day | ORAL | 1 refills | Status: DC

## 2019-06-29 NOTE — Assessment & Plan Note (Signed)
Closely monitor renal function with him being on amiodarone and digoxin.  If it worsens, we will need to stop digoxin.  He is also on spironolactone and torsemide.  Restarting Delene Loll will likely cause slight increase in creatinine.

## 2019-06-29 NOTE — Assessment & Plan Note (Signed)
Blood pressure is like a little high today.  I think we can restart Entresto.  Need to follow renal function closely.

## 2019-06-29 NOTE — Assessment & Plan Note (Signed)
Unfortunately, I do not have any labs on him.  If not checked by PCP prior to next follow-up, we will check lipid panel.

## 2019-06-29 NOTE — Assessment & Plan Note (Signed)
Extensive RCA PCI with 2 overlapping stents.  Also PTCA of PDA.  These areas have been patent on follow-up studies.  I have not seen him in quite some time.  He remains on full dose Brilinta plus aspirin.  He is on low-dose beta-blocker along with high-dose of atorvastatin.  Plan:   We will have him stop aspirin and will reduce Brilinta to 60 mg tablets for new refill.  Continue beta-blocker and statin -> closely monitor LFTs with amiodarone and statin in a patient with longstanding alcohol use.

## 2019-06-29 NOTE — Assessment & Plan Note (Signed)
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  Strongly recommend reconsidering deferring too long as I do think BiV ICD has a chance of improving EF.

## 2019-06-29 NOTE — Assessment & Plan Note (Signed)
Baseline class II-III symptoms.  He has extremely significant lower extremity edema which is probably as much related to venous reflux as it is due to CHF.  He has orthopnea, and when sleeps up a little bit, has no real PND.  Plan:   Continue torsemide at current dose with sliding scale additional doses for weight gain greater than 3 pounds.  Continue spironolactone --currently at 12.5 mg, would like to go titrate further.  However, this has been delayed because of hypotension issues along with renal insufficiency.  I am restarting Entresto along with low-dose Toprol.  Toprol is less important with him being on amiodarone.  Continue digoxin.  We will need to intermittently follow digoxin levels.

## 2019-06-29 NOTE — Assessment & Plan Note (Signed)
Status post ICD placement.  Is also maintained on amiodarone.  No further episodes documented.  Would hope that the plan is to continue with BiV ICD/CRT-D--being deferred because of Covid

## 2019-06-29 NOTE — Assessment & Plan Note (Signed)
Interestingly, the original infarct size was relatively significant but the EF was actually pretty good at 40 to 45%.  However, she ended up having cardioversion and IVC implant in August 2017.  Most recent cath showed patent RCA stents. I do not think the cardiomyopathy is simply because of the major infarct, it is probably more related to left bundle branch block.

## 2019-06-29 NOTE — Progress Notes (Signed)
Please have him schedule a lab visit in 4 weeks to recheck thyroid level. I am sending in a new prescription now.

## 2019-06-29 NOTE — Assessment & Plan Note (Signed)
Again, EF at the time of MI was 45 to 50% and shortly afterwards 50 to 55%.  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  Has ICD placement, I truly think that he would benefit from CRT-D--this has been delayed because of concerns with COVID-19.  I personally think that EF would likely improve because there is no other reason for the EF to be down.  I am restarting Entresto.  Checking temperature panel in roughly 1 week.  I do expect the creatinine to go up a little bit, but this should be acceptable provided he still making urine.  He is on standing dose of torsemide, we discussed sliding scale.

## 2019-07-10 ENCOUNTER — Telehealth: Payer: Self-pay | Admitting: Family Medicine

## 2019-07-10 NOTE — Telephone Encounter (Signed)
Left a message on melissa VM (clinical nurse) ok with verbal PT orders

## 2019-07-10 NOTE — Telephone Encounter (Signed)
I am fine with PT orders.

## 2019-07-10 NOTE — Telephone Encounter (Signed)
Erin with [facility] called for verbal orders for Physical Therapy. She is requesting once a week for 3 weeks. Please advise at [phone].

## 2019-07-18 ENCOUNTER — Other Ambulatory Visit: Payer: Self-pay | Admitting: Internal Medicine

## 2019-07-25 ENCOUNTER — Other Ambulatory Visit: Payer: Self-pay

## 2019-07-25 MED ORDER — TORSEMIDE 20 MG PO TABS: 20.0000 mg | ORAL_TABLET | Freq: Every day | ORAL | 8 refills | Status: DC

## 2019-07-28 ENCOUNTER — Other Ambulatory Visit: Payer: Medicare Other

## 2019-08-02 ENCOUNTER — Other Ambulatory Visit: Payer: Self-pay | Admitting: Internal Medicine

## 2019-08-14 ENCOUNTER — Other Ambulatory Visit: Payer: Self-pay | Admitting: Internal Medicine

## 2019-08-17 ENCOUNTER — Other Ambulatory Visit: Payer: Self-pay | Admitting: Cardiology

## 2019-08-22 ENCOUNTER — Other Ambulatory Visit: Payer: Self-pay | Admitting: Physician Assistant

## 2019-08-23 NOTE — Telephone Encounter (Signed)
This is Dr. Harding's pt. °

## 2019-09-26 ENCOUNTER — Encounter: Payer: Self-pay | Admitting: Cardiology

## 2019-09-26 ENCOUNTER — Ambulatory Visit (INDEPENDENT_AMBULATORY_CARE_PROVIDER_SITE_OTHER): Payer: Medicare Other | Admitting: Cardiology

## 2019-09-26 ENCOUNTER — Other Ambulatory Visit: Payer: Self-pay

## 2019-09-26 VITALS — BP 111/69 | HR 84 | Ht 73.0 in | Wt 251.6 lb

## 2019-09-26 DIAGNOSIS — I2510 Atherosclerotic heart disease of native coronary artery without angina pectoris: Secondary | ICD-10-CM

## 2019-09-26 DIAGNOSIS — Z79899 Other long term (current) drug therapy: Secondary | ICD-10-CM | POA: Diagnosis not present

## 2019-09-26 DIAGNOSIS — E785 Hyperlipidemia, unspecified: Secondary | ICD-10-CM | POA: Diagnosis not present

## 2019-09-26 DIAGNOSIS — I4901 Ventricular fibrillation: Secondary | ICD-10-CM

## 2019-09-26 DIAGNOSIS — I255 Ischemic cardiomyopathy: Secondary | ICD-10-CM

## 2019-09-26 DIAGNOSIS — N1832 Chronic kidney disease, stage 3b: Secondary | ICD-10-CM | POA: Diagnosis not present

## 2019-09-26 DIAGNOSIS — Z95810 Presence of automatic (implantable) cardiac defibrillator: Secondary | ICD-10-CM

## 2019-09-26 DIAGNOSIS — I5042 Chronic combined systolic (congestive) and diastolic (congestive) heart failure: Secondary | ICD-10-CM | POA: Diagnosis not present

## 2019-09-26 DIAGNOSIS — I10 Essential (primary) hypertension: Secondary | ICD-10-CM | POA: Diagnosis not present

## 2019-09-26 NOTE — Progress Notes (Signed)
Primary Care Provider: Girtha Rm, NP-C Cardiologist: Glenetta Hew, MD Electrophysiologist: Virl Axe, MD  Nephrologist: Meredith Leeds. Joelyn Oms, MD  Clinic Note: Chief Complaint  Patient presents with  . Follow-up    3 months  . Coronary Artery Disease    No further angina  . Congestive Heart Failure    Chronic combined -> doing well.  (Ischemic cardiomyopathy)   HPI:    Leonard [name] is a 74 y.o. male with a complex cardiac history noted below who presents today for 3 month follow-up.  CARDIAC HISTORY CAD   July 2017-History large inferior MI complicated by VT: CAD with PCI, to the RCA and PTCA to RPDA; initial EF 45 to 50%.  8//2017: Admitted for Sustained VT / ISCHEMIC CARDIOMYOPATHY,   Cath - Patent RCA stents. High LVEDP (24 mmHg, EF ~35-40%). Ost LAD ~50-60%. -- Noted LBBB  > EPS with PMVT--Saint Jude ICD (followed by Dr. Caryl Comes); he has underlying left bundle branch block --> ? NOT CRTD at that time because EF was 45 to 50%. 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  05/2016 -- R&LHC; patent RCA stents, High LVEDP with Mild Pulm V HTN, CO/CI 3.89 /1.5LOW)  Echo 05/2016 -> EF of 20% to 25%, Gr II DD. Severe diffuse HK - disproportionately severe hypokinesis and thinning of the apex anterior septum anterior wall and septal dyssynchrony with EPAP of 53 mmHg.  Referred to Adv CHF = Dr. Haroldine Laws  Echo January 2019-EF 15 to 20%.  Severely reduced EF.  GRII DD.  Moderate MR.  Mild to moderate the elevated PA P.  02/23/2018-Hospitalized for A-on-C HFrEF - BIV FAILURE complicated by A-on-CRF  IV Diuresis - converted to Torsemide  2/2 RF: Dig, Mearl Latin held.  No BB 2/2 Low CO.   Discharge weight 265.9 (120.9 kg)  Was not seen again until November 2020 by Tommye Standard, PA (EP) -> ambulating at home with a cane.  Mostly orthopedic issues.  Unable to stand with assistance.  (Was still drinking 5-6 drinks of scotch a night): Was on Entresto, beta-blocker and furosemide as well as Aldactone but only taking it 3 days a week.  Also was theoretically on digoxin. ->  Furosemide converted to torsemide 80 mg twice daily and spironolactone increased to 25 mg.  Told to reduce alcohol.  January 19, 2019-Dr. Caryl Comes: Noted that he is feeling better mild dyspnea, but quite active.  "No peripheral edema "--lost 20 to 25 pounds..   Decided to defer conversion to CRT-D until Covid concerns are over. ->  Recommend using resting elliptical trainer.  March 06, 2019-Katherine Purcell Nails, NP: Noted that he was frail and lacked energy.  Was trying to maintain compliance with by the assistance of his wife.  Very depressed.  PCP started Prozac on top of Effexor. -->  Asked to go back to cardiac rehab.  Weights are stable.  Dr. Caryl Comes saw him back on March 24.  Noted that he was doing relatively well with no real breathing limitations.  Was maintaining stable weights.  No lightheadedness upon standing despite relatively low blood pressures.  Suggestive the functional status was improved, and was euvolemic declined CRT-D upgrade.  Recent Hospitalizations:   May 2-4, 2021: Presented with multiple falls.  Including falling and hitting his forehead.  Admitted to having "2 drinks ".  Alcohol level is 170.  Blood pressure in the 70s.  Received NS bolus, and home BP meds were held.  On discharge, plan was to resume torsemide along with along with decreased Aldactone and Toprol doses to 12.5 mg.   Seen by Dr. Joelyn Oms (nephrology) 06/21/2019: Stage IIIb CKD (weight was 248 LB); no evidence of proteinuria, hematuria or pyuria.  Not on any inciting agent such as NSAIDs.  On 1/2 tablet of Toprol 25 mg (12.5 mg) daily, continued on digoxin as well as torsemide spironolactone.  Continued on Entresto,  and atorvastatin.  Plan for renal imaging.  Avoid NSAIDs.  SPEP UPEP checked along with urine studies  Lecil Tapp [name] was last seen by me on June 23, 2019 (first time since 2019): Was doing a whole lot better.  Persistent LE edema but improved.  Sleeping better with less PND but still has some orthopnea.  Psychologically was improved, and was doing much better on the thiamine/folate.  Was notably restricting his alcohol.  (Weights at home were roughly 245 lb at baseline (corresponding to 250 lb here). ->  Baseline exertional dyspnea, also limited by arthritis knee and hip pain as well as back pain.  Uses rolling walker.  No angina.  Some orthostatic dizziness, no syncope or near syncope.  Plan: Stop aspirin, and convert to maintenance dose Brilinta 60 mg twice daily.  We also had him restart Entresto 24/26 mg twice daily.     Reviewed  CV studies:    The following studies were reviewed today: (if available, images/films reviewed: From Epic Chart or Care Everywhere)  . 06/22/2019-Lower Extremity Arterial Doppler: Right-mild lower extremity arterial disease with abnormal TBI.  Moderate Left PAD-abnormal TBI.   Interval History:   Leonard [name] is here today for routine follow-up overall doing remarkably well.[name]  He feels great.  He says that since his hospitalization he has been feeling about as good as he has felt in years.  His weights have improved.  He has not had a drink since the end of April and feels better.  Energy level is better.  Edema is stable.  He is try to do more exercising, but is difficult for him to walk.  He is doing a stair stepper for exercise because this is easier on his joints.  Otherwise though he is always on the go always having some chiropractic to work on.  He has not had an alcoholic drink since the last admission.  He says his appetite is not as much as it used to be and so he is hoping that he will actually generally lose weight.  For now his weights are  stable (at home his weights are around 245 pounds which is equivalent to 251 pounds here).  He notes baseline exertional dyspnea mostly because of deconditioning and obesity as well as antalgic gait.  Is a lot of effort for him to walk because of arthritis pains.  He is much better overall from an emotional and psychological standpoint.  He says that his depression is pretty well controlled.  No major issues with fatigue or anhedonia.  He has some exercise intolerance mostly because of arthritis pains now.  No signs or symptoms of recurrent arrhythmias.  No syncope or near syncope.  No recurrence of anginal symptoms.  Heart failure symptoms are minimal.  May be some orthopnea related to obesity and OHS/OSA but not anything out of the ordinary.  CV Review of Symptoms (Summary) Cardiovascular ROS: positive for - dyspnea on  exertion, edema, orthopnea and All these remained stable negative for - chest pain, edema, irregular heartbeat, loss of consciousness, palpitations, paroxysmal nocturnal dyspnea, rapid heart rate or Syncope/near syncope, TIA/amaurosis fugax, claudication   He seems doing well overall with his reinstitution of Entresto.  He has not required any additional doses of torsemide.  He is actually also back on full dose spironolactone.  He indicated that he would not able to cut it in half.  The patient DOES NOT have symptoms concerning for COVID-19 infection (fever, chills, cough, or new shortness of breath).  The patient is practicing social distancing & Masking.  Immunization History  Administered Date(s) Administered  . Fluad Quad(high Dose 65+) 09/16/2018  . Influenza, High Dose Seasonal PF 10/14/2015, 10/06/2016, 10/20/2017  . Influenza-Unspecified 10/20/2014  . PFIZER SARS-COV-2 Vaccination 03/15/2019, 04/13/2019  . Pneumococcal Conjugate-13 10/06/2016  . Pneumococcal Polysaccharide-23 07/21/2012, 08/13/2015  . Td 05/20/2007, 08/26/2010  . Tdap 02/12/2015  . Zoster  07/21/2012  . Zoster Recombinat (Shingrix) 06/22/2018, 09/16/2018    REVIEWED OF SYSTEMS   Review of Systems  Constitutional: Positive for malaise/fatigue.       Overall reduced appetite.  HENT: Negative for congestion and nosebleeds.   Respiratory: Positive for shortness of breath (Improved from baseline.). Negative for cough.   Cardiovascular: Negative for palpitations and claudication.  Gastrointestinal: Negative for abdominal pain, blood in stool and melena.  Genitourinary: Positive for frequency (With diuretic). Negative for hematuria.  Musculoskeletal: Positive for joint pain. Negative for falls.  Neurological: Positive for tremors (Neuropathy), focal weakness (Legs from hips down) and headaches. Negative for dizziness.  Psychiatric/Behavioral: Positive for depression (Mood seems to be little more stable.  Is on Effexor.) and memory loss. Negative for substance abuse (Wife seems to say that his alcohol intake has reduced.). The patient is nervous/anxious and has insomnia.    I have reviewed and (if needed) personally updated the patient's problem list, medications, allergies, past medical and surgical history, social and family history.   PAST MEDICAL HISTORY   Past Medical History:  Diagnosis Date  . AICD (automatic cardioverter/defibrillator) present 08/26/2015   STJ  . Anxiety   . Atherosclerotic heart disease of native coronary artery with angina pectoris ([place]) 07/2015   a. 07/2015 Posterior STEMI/PCI: LM nl, LAd 40ost, RI 40, RCA 134m (3.0x22 Resolute Integrity DES distal, 3.0x12 Resolute Integrity DES prox), 90d (PTCA), RPDA small, nl, RPLB2 100 - too small for PTCA. -- patent in f/u cath 08/2015 & 05/2016 - patent RCA stents (~10%), RI 40%, LM ~30% ost LAD ~40%.  . Chronic combined systolic and diastolic CHF (congestive heart failure) ([place]) 07/2015   a. 07/2015 Ehco: EF 45-50%, basal-mid inf and infsept AK, inflat, apical inf, and apical septal HK, Gr1 DD, triv MR.;; b - Echo  05/2016 -> EF 20-25%, Gr 2 DD.  Severe diffuse HK disproportionately severe HK and thinning of apex, anterior septum and anterior wall-with septal dyssynchrony.  PAP ~53 mmHg.  [name] Hyperlipidemia with target low density lipoprotein (LDL) cholesterol less than 70 mg/dL   . Hypertensive heart disease   . Ischemic cardiomyopathy 07/2015 - 05/2016   a. 07/2015 Echo: EF 45-50%.; Follow-up Echo 08/23/2015: EF 50-55% with mild inferior hypokinesis --> 05/2016: EF 20-25%, diffuse HK. PAP ~53 mmHg.  . Kidney disease, chronic, stage [name] (GFR 30-59 ml/min) 08/12/2015  . Morbid obesity ([place])   . ST elevation myocardial infarction (STEMI) of inferior wall ([place]) 08/12/2015   Subacute presentation for inferior MI with post infarction angina on the  following day. EKG still shows injury current. Therefore decided to proceed with intervention of 100% mRCA thrombotic lesion.  . Sustained ventricular fibrillation ([place]) 08/22/2015   Underwent EP evaluation with final ICD implantation.    Immunization History  Administered Date(s) Administered  . Fluad Quad(high Dose 65+) 09/16/2018  . Influenza, High Dose Seasonal PF 10/14/2015, 10/06/2016, 10/20/2017  . Influenza-Unspecified 10/20/2014  . PFIZER SARS-COV-2 Vaccination 03/15/2019, 04/13/2019  . Pneumococcal Conjugate-13 10/06/2016  . Pneumococcal Polysaccharide-23 07/21/2012, 08/13/2015  . Td 05/20/2007, 08/26/2010  . Tdap 02/12/2015  . Zoster 07/21/2012  . Zoster Recombinat (Shingrix) 06/22/2018, 09/16/2018    PAST SURGICAL HISTORY   Past Surgical History:  Procedure Laterality Date  . CARDIAC CATHETERIZATION N/A 08/12/2015   Procedure: Left Heart Cath and Coronary Angiography;  Surgeon: Leonie Man, MD;  Location: [place] [facility];  Service: Cardiovascular: Inferior STEMI: 100% mid RCA, 90% distal RCA, 100% RPL. 40% ostial and proximal LAD and branch of ramus.  [name] CARDIAC CATHETERIZATION N/A 08/26/2015   Procedure: Left Heart Cath and Coronary Angiography;   Surgeon: Belva Crome, MD;  Location: [place] [facility];  Service: Cardiovascular: To evaluate sustained VT. Widely patent RCA stent and distal PTCA site. Also patent RPL branch. LAD lesion appeared to be more consistent with 50-55% and 40%. Otherwise stable from previous cath. EF estimated 35-45%.  [name] CARDIOVERSION N/A 08/23/2015   Procedure: CARDIOVERSION;  Surgeon: Deboraha Sprang, MD;  Location: [facility] [place];  Service: Cardiovascular;  Laterality: N/A;  . CORONARY ANGIOPLASTY WITH STENT PLACEMENT  08/12/2015   Mid RCA 100% reduced to 0% with 2 overlapping Resolute DES.  3.0 x 22 mm with a 3.0 x 12 mm prox overlarp (postdilated to 3.4 mm); PTCA of dRCA 90%. (Very dificult,complex case - tortuous, [place] RCA - unable to advance longer stents.  RPL2 noted to have thromboembolic 578% occlusion - unable to reach.  . ELECTROPHYSIOLOGIC STUDY N/A 08/26/2015   Procedure: Electrophysiology Study;  Surgeon: Deboraha Sprang, MD;  Location: [place] [facility];  Service: Cardiovascular;  Laterality: N/A;  . EP IMPLANTABLE DEVICE N/A 08/26/2015   Procedure: ICD Implant;  Surgeon: Deboraha Sprang, MD;  Location: [place] [facility];  Service: Cardiovascular;  Laterality: LEFT:  St Jude ICD, serial number [id number].   [name] HEMORRHOID SURGERY    . LEA DOPPLERS  06/2019   Lower Extremity Arterial Doppler: Right-mild lower extremity arterial disease with abnormal TBI.  Moderate Left PAD-abnormal TBI.  [name] Lower Extr V Dopplers  04/2019   Lower Venous Reflux Study.:  No evidence of obvious DVT bilaterally.  Reflux noted in Right CFV, GSV in the thigh as well as calf, as well as SSV.  Venous reflux noted in the Left GSV in the thigh, but no evidence in SSV or CFV.  [name] RIGHT/LEFT HEART CATH AND CORONARY ANGIOGRAPHY N/A 06/10/2016   Procedure: Right/Left Heart Cath and Coronary Angiography;  Surgeon: Martinique, Peter M, MD;  Location: [facility] IN[facility]ASIVE [facility]:  Nonobstructive CAD. RCA stents PATENT.  Ost-Prox RCA, 10 %. Ost LM 30 %. Ost LAD  40 %. RI, 40 %; High PCWP  & LVEDP (23 mmHg) ; PAP 43/23/31 mmHg. CO/CI 3.89 / 1/5 (Severely Reduced)  . SKIN SURGERY    . TRANSTHORACIC ECHOCARDIOGRAM  08/13/2015; 08/23/2015   a. Mild concentric LVH. EF 45-50%. Basal-mid inferior and inferoseptal akinesis with hypokinesis of inferolateral and apical inferior wall. 1 DD. ;; b. Technically difficult study. Definity contrast administered.  Compared to a  prior echo in 07/2015, the LVEF is slightly higher at 50-55% with mild inferior hypokinesis.  . TRANSTHORACIC ECHOCARDIOGRAM  06/09/2016   EF  20-25%, GR 2 DD. Severe diffuse  hypokinesis with distinct regional wall motion abnormalities.   There is disproportionately severe hypokinesis and thinning of the apex, anterior septum and anterior wall (septal dyssynchrony). However, there is severe dyssynchrony , making it difficult to assess regional function.  PA P ~53 mmHg  . TRANSTHORACIC ECHOCARDIOGRAM  01/2017   Severely reduced EF.  GI to DD.  Moderate MR.  Mild to have a moderately elevated PAP (peak 29mmHg)  . TRANSTHORACIC ECHOCARDIOGRAM  11/2018    EF 15-20%.  Mild dilation.  Global HK.  Moderate RV enlargement with mildly reduced function.  Severe biatrial enlargement.  Moderate pleural effusion.  Moderate MAP:.  Moderate aortic sclerosis.  Mild ascending aortic dilation-41 mm.  Increased CVP estimated 15 mmHg.    MEDICATIONS/ALLERGIES   Current Meds  Medication Sig  . acetaminophen (TYLENOL) 325 MG tablet Take 2 tablets (650 mg total) by mouth every 4 (four) hours as needed for headache or mild pain.  [name] amiodarone (PACERONE) 200 MG tablet Take 1.5 tablets (300 mg total) by mouth daily.  [name] atorvastatin (LIPITOR) 80 MG tablet TAKE 1 TABLET BY MOUTH DAILY AT 6 PM; MAKE ANNUAL APPOINTMENT WITH DOCTOR KLEIN FOR FUTURE REFILLS  . Cholecalciferol (VITAMIN D) 50 MCG (2000 UT) CAPS Take 0.0005 capsules (1 Units total) by mouth daily.  . Coenzyme Q10 (COQ10) 200 MG CAPS Take 200 mg by mouth daily.  .  digoxin (LANOXIN) 0.125 MG tablet Take 125 mcg by mouth daily.  . hydrocerin (EUCERIN) CREA Apply 1 application topically 2 (two) times daily.  [name] levothyroxine (SYNTHROID) 50 MCG tablet Take 1 tablet (50 mcg total) by mouth daily.  . metoprolol succinate (TOPROL-XL) 25 MG 24 hr tablet Take 0.5 tablets (12.5 mg total) by mouth daily.  . Multiple Vitamins-Minerals (MULTIVITAMIN ADULT EXTRA C PO) TAKE 1 TABLET BY MOUTH DAILY  . sacubitril-valsartan (ENTRESTO) 24-26 MG Take 1 tablet by mouth 2 (two) times daily.  [name] spironolactone (ALDACTONE) 25 MG tablet TAKE 1 TABLET(25 MG) BY MOUTH DAILY  . ticagrelor (BRILINTA) 60 MG TABS tablet Take 1 tablet (60 mg total) by mouth 2 (two) times daily.  [name] torsemide (DEMADEX) 20 MG tablet Take 1 tablet (20 mg total) by mouth daily.  [name] venlafaxine (EFFEXOR) 75 MG tablet Take 1 tablet (75 mg total) by mouth daily.    Allergies  Allergen Reactions  . Clams [Shellfish Allergy] Shortness Of Breath, Itching and Swelling  . Shellfish Allergy Anaphylaxis  . Shellfish-Derived Products Shortness Of Breath, Itching and Swelling  . Sulfa Antibiotics Other (See Comments)    unknown  . Other Itching and Other (See Comments)    Ragweed causes watery eyes, itching, sneezing  . Sulfa Antibiotics Other (See Comments)    Pt unsure of reaction, mother told him he was allergic     SOCIAL HISTORY/FAMILY HISTORY   Reviewed in Epic:  Pertinent findings:  Social History   Social History Narrative   He tells me that his daughter died on Halloween 16-Apr-2015. She had a diving accident at the age of 14 and been quadriplegic for 25 years. She had been incredibly vigorous girl. He showed me her high school graduation picture.She was as he reported beautiful.  Her name was [name].      He still lives with his wife, [name].  He does have a living  will and [name] is his Ambulance person.     OBJCTIVE -PE, EKG, labs   Wt Readings from Last 3 Encounters:  09/26/19 251 lb 9.6 oz  (114.1 kg)  06/28/19 252 lb 9.6 oz (114.6 kg)  06/23/19 250 lb 12.8 oz (113.8 kg)    Physical Exam: BP 111/69   Pulse 84   Ht 6\' 1"  (1.854 m)   Wt 251 lb 9.6 oz (114.1 kg)   SpO2 97%   BMI 33.19 kg/m  Physical Exam Vitals reviewed.  Constitutional:      General: He is not in acute distress.    Appearance: He is obese. He is ill-appearing (Chronically ill, but notably improved overall). He is not toxic-appearing.  HENT:     Head: Normocephalic and atraumatic.  Neck:     Vascular: JVD (~7cmH2O) present. No carotid bruit or hepatojugular reflux.  Cardiovascular:     Rate and Rhythm: Regular rhythm. Occasional extrasystoles are present.    Chest Wall: PMI is not displaced (Difficult to palpate, but does seem to be somewhat laterally displaced, sustained.).     Pulses: Decreased pulses (Barely able to palpate pedal pulses.  Radial pulses are intact).     Heart sounds: S1 normal and S2 normal. Heart sounds are distant. Murmur heard.  Medium-pitched harsh crescendo-decrescendo early systolic murmur is present with a grade of 2/6 at the upper right sternal border radiating to the neck.  Gallop present. S4 (Versus split S2) sounds present.   Pulmonary:     Effort: Pulmonary effort is normal. No respiratory distress.     Comments: Distant/diminished breath sounds throughout, but nonlabored.  Mild crackles, no rales or rhonchi. Chest:     Chest wall: No tenderness.  Abdominal:     Palpations: Mass: Unable to assess HSM.  Musculoskeletal:        General: Swelling (Bilateral edema R>L -1-2++ up to the knees.) present.     Cervical back: Normal range of motion and neck supple.     Comments: Walks with a rolling walker  Skin:    General: Skin is warm and dry.     Comments: Diffuse brawny venous stasis changes  Neurological:     General: No focal deficit present.     Mental Status: He is alert and oriented to person, place, and time.     Motor: Weakness (Bilateral legs) present.      Gait: Gait abnormal (Uses rolling walker).  Psychiatric:        Mood and Affect: Mood normal.        Behavior: Behavior normal.        Thought Content: Thought content normal.        Judgment: Judgment normal.     Comments: His wife is here today to help out with questions, but he actually is doing very well today.  Very coherent.  In great mood.      Adult ECG Report Not checked  Recent Labs: No recent lipid labs.. From nephrologist June 21, 2019:  Na+ 138, K+ 3.8, Cl- 98, HCO3- 30, BUN 24, Cr 1.69, Glu 110, Ca2+ 9.4;   CBC: W 12.4, H/H 14.1/41.4, Plt 268 Lab Results  Component Value Date   CHOL 170 09/22/2017   HDL 44 09/22/2017   LDLCALC 80 09/22/2017   TRIG 229 (H) 09/22/2017   CHOLHDL 3.9 09/22/2017   Lab Results  Component Value Date   CREATININE 2.07 (H) 06/01/2019   BUN 41 (H) 06/01/2019   NA 135 06/01/2019  K 5.0 06/01/2019   CL 95 (L) 06/01/2019   CO2 24 06/01/2019   Lab Results  Component Value Date   CREATININE 2.07 (H) 06/01/2019   CREATININE 2.02 (H) 05/23/2019   CREATININE 2.41 (H) 05/22/2019    Lab Results  Component Value Date   TSH 11.400 (H) 06/28/2019    ASSESSMENT/PLAN    Problem List Items Addressed This Visit    Coronary artery disease involving native coronary artery without angina pectoris (Chronic)    History of inferior STEMI with extensive RCA disease-2 overlapping DES stents and PTCA of the PDA.  The stents have been patent as has the PTCA site in follow-up cath x2. We have converted him from mid treatment dose to maintenance dose Brilinta and stopped aspirin.  Plan:  Continue high-dose/high intensity statin (atorvastatin 80 mg) -> needs LFTs and lipids checked.  He is on low-dose Toprol along with amiodarone for beta-blockade -> needs standard amiodarone labs  On Entresto.  I agree with his nephrologist, we need to consider adding SGLT2 inhibitor given his baseline renal disease and CHF (at follow-up we will discuss cost  analysis)      Relevant Orders   C-reactive protein   Hepatic function panel   Lipid panel   Essential hypertension (Chronic)    His blood pressures are actually pretty well controlled now that he is back on Entresto with low-dose beta-blocker and spironolactone.  I do not think we have room to titrate Entresto any further however.      Hyperlipidemia LDL goal <70 (Chronic)    Labs have not been checked.  They are close recheck by this visit, but not checked.  Plan: Continue current dose of atorvastatin. We will order lipid panel and LFTs (along with CRP, sed rate for standard amiodarone monitoring)      Relevant Orders   Hepatic function panel   Lipid panel   Morbid obesity (HCC) (Chronic)   Relevant Orders   Lipid panel   Chronic combined systolic and diastolic CHF (congestive heart failure) (HCC) - Primary (Chronic)    He now has class I-II symptoms.  Weights are stable.  He notes improved energy.  Again I suspect that there is a good complement of his reduced EF that could be related to LBBB and RV lead placement. ->  He has declined CRT-D according to recent notes.  I think this may have been partly related to his concerns of being in the hospital secondary to Covid.  Currently seems relatively euvolemic and overall stable.  Plan:  Continue current low-dose Toprol along with low-dose Entresto (blood pressures not likely to tolerate titration further.  Continue digoxin, although need to monitor based on renal insufficiency  On stable dose of spironolactone along with torsemide.  Currently not requiring any additional dosing, but does have as needed parameters.  Cost analysis for SGLT2 inhibitor.      Relevant Orders   Sedimentation rate   C-reactive protein   Hepatic function panel   Lipid panel   Sustained ventricular fibrillation (HCC) (Chronic)    Unfortunately, at the time of his ICD placement, his EF was not quite in the range where he he would have  automatically had CRT-D done.  We need to continue to address the concept of CRT-D-has been delayed in the past because of Covid and then supposedly was declined during last ED visit.      S/P implantation of automatic cardioverter/defibrillator (AICD) (Chronic)    ICD replaced at the time of  EPS when he presented with VT.  At that time his EF was 35 to 40% on echo.  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 ->  As a result, his EF is now notably worse.   Strongly need to consider upgrade to CRT-D.      CKD (chronic kidney disease) stage 3, GFR 30-59 ml/min (Chronic)    Now followed by Dr. Joni Fears from nephrology.  Most recent creatinine was notably improved.  Need to closely monitor since he is on Entresto and digoxin as well as spironolactone.      On amiodarone therapy (Chronic)    I discussed with him the importance of liver function evaluation while on amiodarone.  I also discussed the major impact of alcohol intoxication while on amiodarone.  Plan: Routine amiodarone labs with CBC CRP and sed rate to evaluate for inflammatory lung toxicity (will eventually need to get PFTs as COVID-19 restrictions reduced)  PCP is following thyroid levels (he is already on thyroid replacement therapy.      Relevant Orders   Sedimentation rate   C-reactive protein      He will need routine testing for amiodarone and digoxin--thyroid levels, LFTs and PFTs as well as digoxin level.  COVID-19 Education: The signs and symptoms of COVID-19 were discussed with the patient and how to seek care for testing (follow up with PCP or arrange E-visit).   The importance of social distancing and COVID-19 vaccination was discussed today.  I spent a total of 10minutes with the patient. >  50% of the time was spent in direct patient consultation.  Additional time spent with chart review  / charting (studies, outside notes, etc): 16 --> Patient has had  follow-up with nephrology that I do not have the report on before.  I reviewed the clinic note which led me to review previous hospitalization reports.  Medical history updated. Total Time: 41min   Current medicines are reviewed at length with the patient today.  (+/- concerns) none  Notice: This dictation was prepared with Dragon dictation along with smaller phrase technology. Any transcriptional errors that result from this process are unintentional and may not be corrected upon review.  Patient Instructions / Medication Changes & Studies & Tests Ordered   Patient Instructions  Medication Instructions:  No changes *If you need a refill on your cardiac medications before your next appointment, please call your pharmacy*   Lab Work: Liver function panel Lipid - fasting Sed rate C-RP Please have labs done when you get your labs done for Kidney doctor at [place] If you have labs (blood work) drawn today and your tests are completely normal, you will receive your results only by: [name] MyChart Message (if you have MyChart) OR . A paper copy in the mail If you have any lab test that is abnormal or we need to change your treatment, we will call you to review the results.   Testing/Procedures: Not needed  Follow-Up: At [facility], you and your health needs are our priority.  As part of our continuing mission to provide you with exceptional heart care, we have created designated Provider Care Teams.  These Care Teams include your primary Cardiologist (physician) and Advanced Practice Providers (APPs -  Physician Assistants and Nurse Practitioners) who all work together to provide you with the care you need, when you need it.    Your next appointment:   6 month(s)  The format for your  next appointment:   In Person  Provider:   Glenetta Hew, MD     Studies Ordered:   Orders Placed This Encounter  Procedures  . Sedimentation rate  . C-reactive protein  . Hepatic function  panel  . Lipid panel     Glenetta Hew, M.D., M.S. Interventional Cardiologist   Pager # [phone] Phone # [phone] [address]. [place], [place] [address]   Thank you for choosing Heartcare at [facility]!!

## 2019-09-26 NOTE — Patient Instructions (Addendum)
Medication Instructions:  No changes *If you need a refill on your cardiac medications before your next appointment, please call your pharmacy*   Lab Work: Liver function panel Lipid - fasting Sed rate C-RP Please have labs done when you get your labs done for Kidney doctor at [place] If you have labs (blood work) drawn today and your tests are completely normal, you will receive your results only by: [name] MyChart Message (if you have MyChart) OR . A paper copy in the mail If you have any lab test that is abnormal or we need to change your treatment, we will call you to review the results.   Testing/Procedures: Not needed  Follow-Up: At [facility], you and your health needs are our priority.  As part of our continuing mission to provide you with exceptional heart care, we have created designated Provider Care Teams.  These Care Teams include your primary Cardiologist (physician) and Advanced Practice Providers (APPs -  Physician Assistants and Nurse Practitioners) who all work together to provide you with the care you need, when you need it.    Your next appointment:   6 month(s)  The format for your next appointment:   In Person  Provider:   Glenetta Hew, MD

## 2019-10-04 ENCOUNTER — Encounter: Payer: Self-pay | Admitting: Cardiology

## 2019-10-04 NOTE — Assessment & Plan Note (Signed)
He now has class I-II symptoms.  Weights are stable.  He notes improved energy.  Again I suspect that there is a good complement of his reduced EF that could be related to LBBB and RV lead placement. ->  He has declined CRT-D according to recent notes.  I think this may have been partly related to his concerns of being in the hospital secondary to Covid.  Currently seems relatively euvolemic and overall stable.  Plan:  Continue current low-dose Toprol along with low-dose Entresto (blood pressures not likely to tolerate titration further.  Continue digoxin, although need to monitor based on renal insufficiency  On stable dose of spironolactone along with torsemide.  Currently not requiring any additional dosing, but does have as needed parameters.  Cost analysis for SGLT2 inhibitor.

## 2019-10-04 NOTE — Assessment & Plan Note (Signed)
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  At that time his EF was 35 to 40% on echo.  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 ->  As a result, his EF is now notably worse.   Strongly need to consider upgrade to CRT-D.

## 2019-10-04 NOTE — Assessment & Plan Note (Signed)
Now followed by Dr. Joni Fears from nephrology.  Most recent creatinine was notably improved.  Need to closely monitor since he is on Entresto and digoxin as well as spironolactone.

## 2019-10-04 NOTE — Assessment & Plan Note (Addendum)
Labs have not been checked.  They are close recheck by this visit, but not checked.  Plan: Continue current dose of atorvastatin. We will order lipid panel and LFTs (along with CRP, sed rate for standard amiodarone monitoring)

## 2019-10-04 NOTE — Assessment & Plan Note (Signed)
Unfortunately, at the time of his ICD placement, his EF was not quite in the range where he he would have automatically had CRT-D done.  We need to continue to address the concept of CRT-D-has been delayed in the past because of Covid and then supposedly was declined during last ED visit.

## 2019-10-04 NOTE — Assessment & Plan Note (Signed)
I discussed with him the importance of liver function evaluation while on amiodarone.  I also discussed the major impact of alcohol intoxication while on amiodarone.  Plan: Routine amiodarone labs with CBC CRP and sed rate to evaluate for inflammatory lung toxicity (will eventually need to get PFTs as COVID-19 restrictions reduced)  PCP is following thyroid levels (he is already on thyroid replacement therapy.

## 2019-10-04 NOTE — Assessment & Plan Note (Signed)
His blood pressures are actually pretty well controlled now that he is back on Entresto with low-dose beta-blocker and spironolactone.  I do not think we have room to titrate Entresto any further however.

## 2019-10-04 NOTE — Assessment & Plan Note (Signed)
History of inferior STEMI with extensive RCA disease-2 overlapping DES stents and PTCA of the PDA.  The stents have been patent as has the PTCA site in follow-up cath x2. We have converted him from mid treatment dose to maintenance dose Brilinta and stopped aspirin.  Plan:  Continue high-dose/high intensity statin (atorvastatin 80 mg) -> needs LFTs and lipids checked.  He is on low-dose Toprol along with amiodarone for beta-blockade -> needs standard amiodarone labs  On Entresto.  I agree with his nephrologist, we need to consider adding SGLT2 inhibitor given his baseline renal disease and CHF (at follow-up we will discuss cost analysis)

## 2019-10-06 DIAGNOSIS — N1832 Chronic kidney disease, stage 3b: Secondary | ICD-10-CM | POA: Diagnosis not present

## 2019-10-06 DIAGNOSIS — I5042 Chronic combined systolic (congestive) and diastolic (congestive) heart failure: Secondary | ICD-10-CM | POA: Diagnosis not present

## 2019-10-06 DIAGNOSIS — Z79899 Other long term (current) drug therapy: Secondary | ICD-10-CM | POA: Diagnosis not present

## 2019-10-06 DIAGNOSIS — I2510 Atherosclerotic heart disease of native coronary artery without angina pectoris: Secondary | ICD-10-CM | POA: Diagnosis not present

## 2019-10-06 DIAGNOSIS — E785 Hyperlipidemia, unspecified: Secondary | ICD-10-CM | POA: Diagnosis not present

## 2019-10-07 LAB — LIPID PANEL
Chol/HDL Ratio: 4.3 ratio (ref 0.0–5.0)
Cholesterol, Total: 150 mg/dL (ref 100–199)
HDL: 35 mg/dL — ABNORMAL LOW (ref >39)
LDL Chol Calc (NIH): 90 mg/dL (ref 0–99)
Triglycerides: 139 mg/dL (ref 0–149)
VLDL Cholesterol Cal: 25 mg/dL (ref 5–40)

## 2019-10-07 LAB — HEPATIC FUNCTION PANEL
ALT: 24 IU/L (ref 0–44)
AST: 19 IU/L (ref 0–40)
Albumin: 4.2 g/dL (ref 3.7–4.7)
Alkaline Phosphatase: 98 IU/L (ref 44–121)
Bilirubin Total: 0.4 mg/dL (ref 0.0–1.2)
Bilirubin, Direct: 0.19 mg/dL (ref 0.00–0.40)
Total Protein: 6.8 g/dL (ref 6.0–8.5)

## 2019-10-07 LAB — C-REACTIVE PROTEIN: CRP: 5 mg/L (ref 0–10)

## 2019-10-07 LAB — SEDIMENTATION RATE: Sed Rate: 18 mm/hr (ref 0–30)

## 2019-10-09 ENCOUNTER — Other Ambulatory Visit: Payer: Self-pay | Admitting: *Deleted

## 2019-10-09 ENCOUNTER — Telehealth: Payer: Self-pay | Admitting: *Deleted

## 2019-10-09 DIAGNOSIS — I2510 Atherosclerotic heart disease of native coronary artery without angina pectoris: Secondary | ICD-10-CM

## 2019-10-09 DIAGNOSIS — E785 Hyperlipidemia, unspecified: Secondary | ICD-10-CM

## 2019-10-09 MED ORDER — EZETIMIBE 10 MG PO TABS: 10.0000 mg | ORAL_TABLET | Freq: Every day | ORAL | 3 refills | Status: AC

## 2019-10-09 NOTE — Telephone Encounter (Signed)
-----   Message from Leonie Man, MD sent at 10/07/2019  3:11 PM EDT ----- Markers of inflammation: CRP and erythrocyte sedimentation rate are both in the normal range which would suggest that there is no inflammatory response to the use of amiodarone.  Good news.  Liver function tests look great  Cholesterol levels also look pretty good with total actually 150 triglycerides being much better at 139.  The LDL is not at goal however it is at 68. - had previously been closer to target.   Lets try supplementing the atorvastatin with Zetia 10 mg & recheck Lipids in ~2-3 months (may need to consider other options if not able to reach goal)>  Every thing else seems to be doing so well -- Lipids are hard to control (clearly there is a genetic component since he has lost a ton of weight & changed diet).  This should not discourage him.    Glenetta Hew, MD  New Rx: Zetia 10 mg PO Daily.  Disp: 90 tab, 3 refills.

## 2019-10-09 NOTE — Telephone Encounter (Signed)
PER DPR , LEFT DETAILED LAB RESULTS ON SECURE VOICEMAIL AND  SENT RX TO PHARMACY , ANY QUESTION MAY CALL BACK  MAILED LABSLIP FOR RECHECK IN 2-3 MONTHS

## 2019-10-29 NOTE — Progress Notes (Signed)
Leonard [name] is a 74 y.o. male who presents for annual wellness visit and follow-up on chronic medical conditions.  He has the following concerns:  States he fell over the cat in May. No falls since. Uses his walker.  His wife is with him today.  He reports feeling well. No new concerns.   States his energy level is good and he is sleeping well.  Eating 3 meals per day.   HL- cardiologist added Zetia. He is also taking atorvastatin 80 mg  Recalls having prostate biopsy at [facility] in the past. Prefers to not check PSA. Denies urinary symptoms.   States he is limiting alcohol.     Immunization History  Administered Date(s) Administered  . Fluad Quad(high Dose 65+) 09/16/2018  . Influenza, High Dose Seasonal PF 10/14/2015, 10/06/2016, 10/20/2017  . Influenza-Unspecified 10/20/2014  . PFIZER SARS-COV-2 Vaccination 03/15/2019, 04/13/2019  . Pneumococcal Conjugate-13 10/06/2016  . Pneumococcal Polysaccharide-23 07/21/2012, 08/13/2015  . Td 05/20/2007, 08/26/2010  . Tdap 02/12/2015  . Zoster 07/21/2012  . Zoster Recombinat (Shingrix) 06/22/2018, 09/16/2018   Last colonoscopy: several years ago and normal per patient.  Declines colonoscopy and prefers Cologuard.  Last PSA: unclear. Has seen urology Dentist: every 6 months Ophtho: regular visits Exercise: walking some  Other doctors caring for patient include: Dr. Ellyn Hack- Cardiology Dr. Caryl Comes- Cardiology Dr. Marylou Flesher- [facility]  Dr. Oneida Alar- Vascular Dr. Sabas Sous- Dentist   Depression screen:  See questionnaire below.     Depression screen [facility] 2/9 10/30/2019 06/01/2019 01/11/2019 06/14/2018 09/22/2017  Decreased Interest 0 3 1 0 0  Down, Depressed, Hopeless 0 3 1 0 0  PHQ - 2 Score 0 6 2 0 0  Altered sleeping - 2 - - -  Tired, decreased energy - 1 - - -  Change in appetite - 1 - - -  Feeling bad or failure about yourself  - 0 - - -  Trouble concentrating - 0 - - -  Moving slowly or fidgety/restless - 0 - -  -  Suicidal thoughts - 0 - - -  PHQ-9 Score - 10 - - -  Difficult doing work/chores - Somewhat difficult - - -  Some recent data might be hidden    Fall Screen: See Questionaire below.   Fall Risk  10/30/2019 06/01/2019 01/11/2019 06/14/2018 09/22/2017  Falls in the past year? 1 1 0 0 No  Number falls in past yr: 1 1 - 0 -  Injury with Fall? 1 1 - 0 -  Risk Factor Category  - - - - -  Risk for fall due to : Impaired balance/gait - Impaired balance/gait - -  Risk for fall due to: Comment - - - - -    ADL screen:  See questionnaire below.  Functional Status Survey: Is the patient deaf or have difficulty hearing?: No Does the patient have difficulty seeing, even when wearing glasses/contacts?: No Does the patient have difficulty concentrating, remembering, or making decisions?: No Does the patient have difficulty walking or climbing stairs?: Yes Does the patient have difficulty dressing or bathing?: No Does the patient have difficulty doing errands alone such as visiting a doctor's office or shopping?: Yes   End of Life Discussion:  Patient has a living will and medical power of attorney. HCPOA is [name], his wife  MOST form discussed and filled out. Full code    Review of Systems  Constitutional: -fever, -chills, -sweats, -unexpected weight change, -anorexia, -fatigue Allergy: -sneezing, -itching, -congestion Dermatology: denies changing moles,  rash, lumps, new worrisome lesions ENT: -runny nose, -ear pain, -sore throat, -hoarseness, -sinus pain, -teeth pain, -tinnitus, -hearing loss, -epistaxis Cardiology:  -chest pain, -palpitations, -edema, -orthopnea, -paroxysmal nocturnal dyspnea Respiratory: -cough, -shortness of breath, -dyspnea on exertion, -wheezing, -hemoptysis Gastroenterology: -abdominal pain, -nausea, -vomiting, -diarrhea, -constipation, -blood in stool, -changes in bowel movement, -dysphagia Hematology: -bleeding or bruising problems Musculoskeletal: -arthralgias,  -myalgias, -joint swelling, -back pain, -neck pain, -cramping, -gait changes Ophthalmology: -vision changes, -eye redness, -itching, -discharge Urology: -dysuria, -difficulty urinating, -hematuria, -urinary frequency, -urgency, -incontinence Neurology: -headache, -weakness, -tingling, -numbness, -speech abnormality, -memory loss, -falls, -dizziness Psychology:  -depressed mood, -agitation, -sleep problems   PHYSICAL EXAM:  BP 130/80   Pulse (!) 57   Ht 5' 11.5" (1.816 m)   Wt 249 lb 3.2 oz (113 kg)   BMI 34.27 kg/m   General Appearance: Alert, cooperative, no distress, appears stated age Head: Normocephalic, without obvious abnormality, atraumatic Eyes: PERRL, conjunctiva/corneas clear, EOM's intact Ears: Normal TM's and external ear canals Nose: mask on  Throat: mask on  Neck: Supple, no lymphadenopathy, thyroid:no enlargement/tenderness/nodules Back: Spine nontender Lungs: Clear to auscultation bilaterally without wheezes, rales or ronchi; respirations unlabored Chest Wall: No tenderness or deformity Heart: Regular rate and rhythm Breast Exam: No chest wall tenderness Abdomen: Soft, non-tender, nondistended, normoactive bowel sounds Extremities: No clubbing, cyanosis or edema Pulses: 2+ and symmetric all extremities Skin: Skin color, texture, turgor normal, no rashes or lesions Lymph nodes: Cervical, supraclavicular Neurologic: CNII-XII intact, normal strength Psych: Normal mood, affect, hygiene and grooming  ASSESSMENT/PLAN: Medicare annual wellness visit, subsequent -appears to be in his usual state of health and his mood is good. Reviewed medications and preventive health care. Declines PSA. cologuard ordered. No issues with ADLs and no recent falls. Continue using walker. He lives on one level and no steps required. Counseling on healthy lifestyle including diet, exercise and limiting alcohol.   Essential hypertension - Plan: CBC with Differential/Platelet, Comprehensive  metabolic panel -BP at goal. Continue medication, low sodium diet and follow up with cardiologist as recommended.   Stage 3 chronic kidney disease, unspecified whether stage 3a or 3b CKD ([place]) - Plan: Comprehensive metabolic panel -continue to monitor and send lab results to his nephrologist.   Depression, unspecified depression type -mood is good. Continue Effexor   Hyperlipidemia LDL goal <70 - Plan: Lipid panel -continue statin. Followed by cardiology. Check fasting lipids  Chronic combined systolic and diastolic CHF (congestive heart failure) ([place]) - Plan: Magnesium -euvolemic. 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   Needs flu shot - Plan: Flu Vaccine QUAD High Dose(Fluad)  Immunization counseling  Advance directive discussed with patient  Hypothyroidism, unspecified type - Plan: TSH, T4, free -we increased his levothyroxine and are working to get his thyroid function to normal range. Check TSH and adjust dose as needed.   Vitamin D deficiency - Plan: VITAMIN D 25 Hydroxy (Vit-D Deficiency, Fractures) -check vitamin D level and follow up  Screen for colon cancer - Plan: Cologuard -declines colonoscopy. Appears to be average risk for colon cancer.      Discussed PSA screening (risks/benefits), recommended at least 30 minutes of aerobic activity at least 5 days/week; proper sunscreen use reviewed; healthy diet and alcohol recommendations (less than or equal to 2 drinks/day) reviewed; regular seatbelt use; changing batteries in smoke detectors. Immunization recommendations discussed.  Colonoscopy recommendations reviewed.   Medicare Attestation I have personally  reviewed: The patient's medical and social history Their use of alcohol, tobacco or illicit drugs Their current medications and supplements The patient's functional ability including  ADLs,fall risks, home safety risks, cognitive, and hearing and visual impairment Diet and physical activities Evidence for depression or mood disorders  The patient's weight, height, and BMI have been recorded in the chart.  I have made referrals, counseling, and provided education to the patient based on review of the above and I have provided the patient with a written personalized care plan for preventive services.     [name], NP-C   10/30/2019

## 2019-10-30 ENCOUNTER — Ambulatory Visit (INDEPENDENT_AMBULATORY_CARE_PROVIDER_SITE_OTHER): Payer: Medicare Other | Admitting: Family Medicine

## 2019-10-30 ENCOUNTER — Other Ambulatory Visit: Payer: Self-pay

## 2019-10-30 ENCOUNTER — Encounter: Payer: Self-pay | Admitting: Family Medicine

## 2019-10-30 VITALS — BP 130/80 | HR 57 | Ht 71.5 in | Wt 249.2 lb

## 2019-10-30 DIAGNOSIS — N183 Chronic kidney disease, stage 3 unspecified: Secondary | ICD-10-CM | POA: Diagnosis not present

## 2019-10-30 DIAGNOSIS — Z1211 Encounter for screening for malignant neoplasm of colon: Secondary | ICD-10-CM | POA: Diagnosis not present

## 2019-10-30 DIAGNOSIS — Z0000 Encounter for general adult medical examination without abnormal findings: Secondary | ICD-10-CM

## 2019-10-30 DIAGNOSIS — I10 Essential (primary) hypertension: Secondary | ICD-10-CM | POA: Diagnosis not present

## 2019-10-30 DIAGNOSIS — E039 Hypothyroidism, unspecified: Secondary | ICD-10-CM | POA: Diagnosis not present

## 2019-10-30 DIAGNOSIS — E559 Vitamin D deficiency, unspecified: Secondary | ICD-10-CM | POA: Diagnosis not present

## 2019-10-30 DIAGNOSIS — I2510 Atherosclerotic heart disease of native coronary artery without angina pectoris: Secondary | ICD-10-CM

## 2019-10-30 DIAGNOSIS — Z7185 Encounter for immunization safety counseling: Secondary | ICD-10-CM | POA: Diagnosis not present

## 2019-10-30 DIAGNOSIS — Z23 Encounter for immunization: Secondary | ICD-10-CM

## 2019-10-30 DIAGNOSIS — Z7189 Other specified counseling: Secondary | ICD-10-CM

## 2019-10-30 DIAGNOSIS — E785 Hyperlipidemia, unspecified: Secondary | ICD-10-CM

## 2019-10-30 DIAGNOSIS — F32A Depression, unspecified: Secondary | ICD-10-CM | POA: Diagnosis not present

## 2019-10-30 DIAGNOSIS — I5042 Chronic combined systolic (congestive) and diastolic (congestive) heart failure: Secondary | ICD-10-CM

## 2019-10-30 NOTE — Patient Instructions (Signed)
  Mr. [name] , Thank you for taking time to come for your Medicare Wellness Visit. I appreciate your ongoing commitment to your health goals. Please review the following plan we discussed and let me know if I can assist you in the future.   These are the goals we discussed:  Continue taking your current medication and keep an eye on your blood pressure at home.   I am ordering the Cologuard for you and it will arrive in the mail for you to follow the instructions. This is for colon cancer screening.   We will be in touch with your labs results and forward them to Dr. Ellyn Hack.       This is a list of the screening recommended for you and due dates:  Health Maintenance  Topic Date Due  . Flu Shot  08/20/2019  . Tetanus Vaccine  02/11/2025  . COVID-19 Vaccine  Completed  .  Hepatitis C: One time screening is recommended by Center for Disease Control  (CDC) for  adults born from [birth year] through 1967.   Completed  . Pneumonia vaccines  Completed

## 2019-10-31 ENCOUNTER — Encounter: Payer: Self-pay | Admitting: Family Medicine

## 2019-10-31 ENCOUNTER — Other Ambulatory Visit: Payer: Self-pay | Admitting: Family Medicine

## 2019-10-31 DIAGNOSIS — D729 Disorder of white blood cells, unspecified: Secondary | ICD-10-CM

## 2019-10-31 DIAGNOSIS — E039 Hypothyroidism, unspecified: Secondary | ICD-10-CM

## 2019-10-31 LAB — COMPREHENSIVE METABOLIC PANEL
ALT: 25 IU/L (ref 0–44)
AST: 23 IU/L (ref 0–40)
Albumin/Globulin Ratio: 1.9 (ref 1.2–2.2)
Albumin: 4.5 g/dL (ref 3.7–4.7)
Alkaline Phosphatase: 99 IU/L (ref 44–121)
BUN/Creatinine Ratio: 20 (ref 10–24)
BUN: 34 mg/dL — ABNORMAL HIGH (ref 8–27)
Bilirubin Total: 0.6 mg/dL (ref 0.0–1.2)
CO2: 25 mmol/L (ref 20–29)
Calcium: 9.4 mg/dL (ref 8.6–10.2)
Chloride: 98 mmol/L (ref 96–106)
Creatinine, Ser: 1.69 mg/dL — ABNORMAL HIGH (ref 0.76–1.27)
GFR calc Af Amer: 45 mL/min/{1.73_m2} — ABNORMAL LOW (ref >59)
GFR calc non Af Amer: 39 mL/min/{1.73_m2} — ABNORMAL LOW (ref >59)
Globulin, Total: 2.4 g/dL (ref 1.5–4.5)
Glucose: 123 mg/dL — ABNORMAL HIGH (ref 65–99)
Potassium: 4.5 mmol/L (ref 3.5–5.2)
Sodium: 137 mmol/L (ref 134–144)
Total Protein: 6.9 g/dL (ref 6.0–8.5)

## 2019-10-31 LAB — LIPID PANEL
Chol/HDL Ratio: 3.4 ratio (ref 0.0–5.0)
Cholesterol, Total: 141 mg/dL (ref 100–199)
HDL: 42 mg/dL (ref >39)
LDL Chol Calc (NIH): 76 mg/dL (ref 0–99)
Triglycerides: 129 mg/dL (ref 0–149)
VLDL Cholesterol Cal: 23 mg/dL (ref 5–40)

## 2019-10-31 LAB — CBC WITH DIFFERENTIAL/PLATELET
Basophils Absolute: 0.1 10*3/uL (ref 0.0–0.2)
Basos: 0 %
EOS (ABSOLUTE): 0.3 10*3/uL (ref 0.0–0.4)
Eos: 2 %
Hematocrit: 42.4 % (ref 37.5–51.0)
Hemoglobin: 14 g/dL (ref 13.0–17.7)
Immature Grans (Abs): 0.1 10*3/uL (ref 0.0–0.1)
Immature Granulocytes: 1 %
Lymphocytes Absolute: 2.2 10*3/uL (ref 0.7–3.1)
Lymphs: 16 %
MCH: 31.3 pg (ref 26.6–33.0)
MCHC: 33 g/dL (ref 31.5–35.7)
MCV: 95 fL (ref 79–97)
Monocytes Absolute: 0.7 10*3/uL (ref 0.1–0.9)
Monocytes: 5 %
Neutrophils Absolute: 10.5 10*3/uL — ABNORMAL HIGH (ref 1.4–7.0)
Neutrophils: 76 %
Platelets: 299 10*3/uL (ref 150–450)
RBC: 4.48 x10E6/uL (ref 4.14–5.80)
RDW: 13.3 % (ref 11.6–15.4)
WBC: 13.8 10*3/uL — ABNORMAL HIGH (ref 3.4–10.8)

## 2019-10-31 LAB — TSH: TSH: 13.1 u[IU]/mL — ABNORMAL HIGH (ref 0.450–4.500)

## 2019-10-31 LAB — MAGNESIUM: Magnesium: 2.3 mg/dL (ref 1.6–2.3)

## 2019-10-31 LAB — T4, FREE: Free T4: 1.09 ng/dL (ref 0.82–1.77)

## 2019-10-31 LAB — VITAMIN D 25 HYDROXY (VIT D DEFICIENCY, FRACTURES): Vit D, 25-Hydroxy: 39 ng/mL (ref 30.0–100.0)

## 2019-10-31 MED ORDER — LEVOTHYROXINE SODIUM 88 MCG PO TABS: 88.0000 ug | ORAL_TABLET | Freq: Every day | ORAL | 0 refills | Status: DC

## 2019-10-31 NOTE — Progress Notes (Signed)
Our mutual patient was in to see me and we checked his fasting labs. I am forwarding results to you per his request. I am increasing his levothyroxine and will recheck TSH in 4 weeks.

## 2019-10-31 NOTE — Progress Notes (Signed)
Please send his labs to his nephrologist. His TSH is still too high so I recommend increasing his dose of levothyroxine to 88 mcg and have his return for a recheck of his TSH in 4 weeks. I would like to recheck his CBC also due to neutrophilia. He does not have to fast.

## 2019-11-01 ENCOUNTER — Other Ambulatory Visit: Payer: Self-pay | Admitting: Internal Medicine

## 2019-11-29 ENCOUNTER — Other Ambulatory Visit: Payer: Medicare Other

## 2019-11-29 ENCOUNTER — Other Ambulatory Visit: Payer: Self-pay

## 2019-11-29 DIAGNOSIS — E039 Hypothyroidism, unspecified: Secondary | ICD-10-CM | POA: Diagnosis not present

## 2019-11-29 DIAGNOSIS — D729 Disorder of white blood cells, unspecified: Secondary | ICD-10-CM | POA: Diagnosis not present

## 2019-11-29 DIAGNOSIS — D72828 Other elevated white blood cell count: Secondary | ICD-10-CM

## 2019-11-30 ENCOUNTER — Encounter: Payer: Self-pay | Admitting: Family Medicine

## 2019-11-30 ENCOUNTER — Other Ambulatory Visit: Payer: Self-pay | Admitting: Family Medicine

## 2019-11-30 DIAGNOSIS — E039 Hypothyroidism, unspecified: Secondary | ICD-10-CM

## 2019-11-30 LAB — CBC WITH DIFFERENTIAL/PLATELET
Basophils Absolute: 0.1 x10E3/uL (ref 0.0–0.2)
Basos: 0 %
EOS (ABSOLUTE): 0.3 x10E3/uL (ref 0.0–0.4)
Eos: 2 %
Hematocrit: 41 % (ref 37.5–51.0)
Hemoglobin: 13.5 g/dL (ref 13.0–17.7)
Immature Grans (Abs): 0.1 x10E3/uL (ref 0.0–0.1)
Immature Granulocytes: 1 %
Lymphocytes Absolute: 3.1 x10E3/uL (ref 0.7–3.1)
Lymphs: 19 %
MCH: 32.2 pg (ref 26.6–33.0)
MCHC: 32.9 g/dL (ref 31.5–35.7)
MCV: 98 fL — ABNORMAL HIGH (ref 79–97)
Monocytes Absolute: 1.1 x10E3/uL — ABNORMAL HIGH (ref 0.1–0.9)
Monocytes: 7 %
Neutrophils Absolute: 11.5 x10E3/uL — ABNORMAL HIGH (ref 1.4–7.0)
Neutrophils: 71 %
Platelets: 289 x10E3/uL (ref 150–450)
RBC: 4.19 x10E6/uL (ref 4.14–5.80)
RDW: 14.5 % (ref 11.6–15.4)
WBC: 16.1 x10E3/uL — ABNORMAL HIGH (ref 3.4–10.8)

## 2019-11-30 LAB — TSH: TSH: 13 u[IU]/mL — ABNORMAL HIGH (ref 0.450–4.500)

## 2019-11-30 MED ORDER — LEVOTHYROXINE SODIUM 100 MCG PO TABS: 100.0000 ug | ORAL_TABLET | Freq: Every day | ORAL | 0 refills | Status: DC

## 2019-11-30 NOTE — Telephone Encounter (Signed)
Called no answer with either phone numbers .   sent message by  Mychart.  Need to get more details .     Hello Ms Leonard [name]  We receive your message in regards to your husband. I would like to discuss detail with you.  Your husband may need an appointment to see  Dr Ellyn Hack or one his  Physician assistant.  In regards to weight gain.  I have called left message on your  Phone as well at he patient.  Dr Ellyn Hack dose not have any availability  At present ,but I do have an opening tomorrow with one his  P.A.  please  Contact by calling into the office [phone].

## 2019-11-30 NOTE — Telephone Encounter (Signed)
Called  Patient left detail message on voice mail - contact offic if  You take double the dose more than 3 days - we will need to order BP to check potassium.  Also if office need to send in a quantity of pills toreswmide   This information was given to patient--   I think increasing dose to double for couple days is reasonable.. Probably cutting back to scotch and snacking will help more.   If he does more than a couple days, would probably want to recheck chemistry levels to make sure his potassium is okay.   Glenetta Hew, MD

## 2019-11-30 NOTE — Telephone Encounter (Signed)
Spoke to patient .  Patient states he and his wife  Notice that he has increase in weight  Bout 12 -to 14 pounds.  He contribute to increase eating and snacking  While he has been watching  World series and watching football .   He is not short of breath talking . He states h becomes  Short breath occasionally at night  Once get up to go to the bathroom . He states he sits up and it goes away.  Patient states he had a little trouble sleeping a few nights ago , better.   He states he does not have any noticeable swelling in lower extremities. No chest discomfort  patient states he has been taking  Torsemide 20 mg daily .   Patient states cough is gone.   Patient question  if he could increase torsemide to see if it may help. RN  Had offered an appointment to be seen in office for evaluation. Patient does not  Sound in distress.  RN  Informed patient will  Defer to Dr Ellyn Hack.    will contact patient back

## 2019-12-15 ENCOUNTER — Other Ambulatory Visit: Payer: Self-pay | Admitting: Family Medicine

## 2019-12-29 ENCOUNTER — Telehealth: Payer: Self-pay | Admitting: Cardiology

## 2019-12-29 NOTE — Telephone Encounter (Signed)
*  STAT* If patient is at the pharmacy, call can be transferred to refill team.   1. Which medications need to be refilled? (please list name of each medication and dose if known) digoxin (LANOXIN) 0.125 MG tablet  2. Which pharmacy/location (including street and city if local pharmacy) is medication to be sent to? [place], [place]  3. Do they need a 30 day or 90 day supply? 90 day supply

## 2020-01-01 ENCOUNTER — Ambulatory Visit
Admission: RE | Admit: 2020-01-01 | Discharge: 2020-01-01 | Disposition: A | Discharge status: Home / Self Care | Payer: Medicare Other | Source: Ambulatory Visit | Attending: Nephrology | Admitting: Nephrology

## 2020-01-01 DIAGNOSIS — N400 Enlarged prostate without lower urinary tract symptoms: Secondary | ICD-10-CM | POA: Diagnosis not present

## 2020-01-01 DIAGNOSIS — N1832 Chronic kidney disease, stage 3b: Secondary | ICD-10-CM

## 2020-01-01 DIAGNOSIS — N320 Bladder-neck obstruction: Secondary | ICD-10-CM | POA: Diagnosis not present

## 2020-01-01 DIAGNOSIS — N189 Chronic kidney disease, unspecified: Secondary | ICD-10-CM | POA: Diagnosis not present

## 2020-01-03 ENCOUNTER — Telehealth: Payer: Medicare Other | Admitting: Medical

## 2020-01-04 ENCOUNTER — Other Ambulatory Visit: Payer: Self-pay | Admitting: Cardiology

## 2020-01-04 ENCOUNTER — Telehealth: Payer: Self-pay | Admitting: Internal Medicine

## 2020-01-04 NOTE — Telephone Encounter (Signed)
Got a notice that pt has not completed cologuard. Contacted pt to let him know and he will think about it and see if he wants to to do it or not. I advised him to do it if possible top prevent going to GI for colonscopy

## 2020-01-05 MED ORDER — DIGOXIN 125 MCG PO TABS: 125.0000 ug | ORAL_TABLET | Freq: Every day | ORAL | 3 refills | Status: DC

## 2020-01-05 NOTE — Addendum Note (Signed)
Addended by: Wonda Horner on: 01/05/2020 04:09 PM   Modules accepted: Orders

## 2020-01-05 NOTE — Telephone Encounter (Signed)
° ° ° °  Pt's wife calling back to follow up refill for digoxin. Pt is out of medication

## 2020-01-26 ENCOUNTER — Ambulatory Visit (INDEPENDENT_AMBULATORY_CARE_PROVIDER_SITE_OTHER): Payer: Medicare Other

## 2020-01-26 DIAGNOSIS — I472 Ventricular tachycardia, unspecified: Secondary | ICD-10-CM

## 2020-01-28 LAB — CUP PACEART REMOTE DEVICE CHECK
Battery Remaining Longevity: 65 mo
Battery Remaining Percentage: 62 %
Battery Voltage: 2.95 V
Brady Statistic RV Percent Paced: 1 %
Date Time Interrogation Session: 20220109052408
HighPow Impedance: 72 Ohm
HighPow Impedance: 72 Ohm
Implantable Lead Implant Date: 20170807
Implantable Lead Location: 753860
Implantable Pulse Generator Implant Date: 20170807
Lead Channel Impedance Value: 410 Ohm
Lead Channel Pacing Threshold Amplitude: 0.75 V
Lead Channel Pacing Threshold Pulse Width: 0.5 ms
Lead Channel Sensing Intrinsic Amplitude: 12 mV
Lead Channel Setting Pacing Amplitude: 2.5 V
Lead Channel Setting Pacing Pulse Width: 0.5 ms
Lead Channel Setting Sensing Sensitivity: 0.5 mV
Pulse Gen Serial Number: 7377761

## 2020-01-29 ENCOUNTER — Encounter: Payer: Self-pay | Admitting: Family Medicine

## 2020-02-09 NOTE — Progress Notes (Signed)
Remote ICD transmission.   

## 2020-02-11 ENCOUNTER — Other Ambulatory Visit: Payer: Self-pay | Admitting: Internal Medicine

## 2020-02-26 DIAGNOSIS — N1832 Chronic kidney disease, stage 3b: Secondary | ICD-10-CM | POA: Diagnosis not present

## 2020-02-26 DIAGNOSIS — I5020 Unspecified systolic (congestive) heart failure: Secondary | ICD-10-CM | POA: Diagnosis not present

## 2020-02-26 DIAGNOSIS — N320 Bladder-neck obstruction: Secondary | ICD-10-CM | POA: Diagnosis not present

## 2020-03-01 ENCOUNTER — Telehealth: Payer: Self-pay | Admitting: Family Medicine

## 2020-03-01 NOTE — Telephone Encounter (Signed)
Pts wife called and stated that pt has a serious case of Jock itch and wanted to know if you could recommend anything. She was advised that pt may need a visit. You are booked today so I wasn't sure what to do

## 2020-03-01 NOTE — Telephone Encounter (Signed)
Please see if he has tried anything yet such as over the counter Lamisil or Lotrimin. Find out what he has done so far for it please.

## 2020-03-01 NOTE — Telephone Encounter (Signed)
Pt used lotrimin 2 hours ago and seems to feel some better.advise to continue to use that and see if it starts to go away or switch to lamasil. If that doesn't help then call and schedule a visit next week

## 2020-03-06 ENCOUNTER — Other Ambulatory Visit: Payer: Self-pay | Admitting: Family Medicine

## 2020-03-06 ENCOUNTER — Other Ambulatory Visit: Payer: Self-pay | Admitting: Internal Medicine

## 2020-03-06 DIAGNOSIS — E039 Hypothyroidism, unspecified: Secondary | ICD-10-CM

## 2020-03-06 NOTE — Telephone Encounter (Signed)
Left message that pt needs to have labs repeated first. Please advised which thyroid labs you want me to put in as future

## 2020-03-06 NOTE — Telephone Encounter (Signed)
TSH is fine. thanks

## 2020-03-07 ENCOUNTER — Other Ambulatory Visit: Payer: Medicare Other

## 2020-03-08 ENCOUNTER — Other Ambulatory Visit: Payer: Self-pay

## 2020-03-08 ENCOUNTER — Other Ambulatory Visit: Payer: Medicare Other

## 2020-03-08 DIAGNOSIS — E039 Hypothyroidism, unspecified: Secondary | ICD-10-CM

## 2020-03-09 LAB — TSH: TSH: 17.3 u[IU]/mL — ABNORMAL HIGH (ref 0.450–4.500)

## 2020-03-10 ENCOUNTER — Other Ambulatory Visit: Payer: Self-pay | Admitting: Family Medicine

## 2020-03-10 DIAGNOSIS — E039 Hypothyroidism, unspecified: Secondary | ICD-10-CM

## 2020-03-10 MED ORDER — LEVOTHYROXINE SODIUM 112 MCG PO TABS: 112.0000 ug | ORAL_TABLET | Freq: Every day | ORAL | 1 refills | Status: DC

## 2020-03-10 NOTE — Progress Notes (Signed)
I am increasing his levothyroxine dose. He will need repeat labs in 4 weeks.

## 2020-03-11 NOTE — Telephone Encounter (Signed)
Pt has to be rechecked in 4 weeks.

## 2020-03-17 ENCOUNTER — Other Ambulatory Visit: Payer: Self-pay | Admitting: Cardiology

## 2020-03-20 ENCOUNTER — Other Ambulatory Visit: Payer: Self-pay

## 2020-03-20 ENCOUNTER — Encounter: Payer: Self-pay | Admitting: Family Medicine

## 2020-03-20 ENCOUNTER — Ambulatory Visit (INDEPENDENT_AMBULATORY_CARE_PROVIDER_SITE_OTHER): Payer: Medicare Other | Admitting: Family Medicine

## 2020-03-20 VITALS — BP 112/78 | HR 72 | Temp 97.8°F | Wt 246.6 lb

## 2020-03-20 DIAGNOSIS — N183 Chronic kidney disease, stage 3 unspecified: Secondary | ICD-10-CM

## 2020-03-20 DIAGNOSIS — E039 Hypothyroidism, unspecified: Secondary | ICD-10-CM | POA: Diagnosis not present

## 2020-03-20 DIAGNOSIS — R110 Nausea: Secondary | ICD-10-CM

## 2020-03-20 DIAGNOSIS — R197 Diarrhea, unspecified: Secondary | ICD-10-CM | POA: Diagnosis not present

## 2020-03-20 DIAGNOSIS — L89311 Pressure ulcer of right buttock, stage 1: Secondary | ICD-10-CM | POA: Diagnosis not present

## 2020-03-20 NOTE — Patient Instructions (Signed)
The skin breakdown did not have was most likely made worse by the week or 2 of diarrhea.  I am glad the diarrhea has stopped.  If it returns, let me know.  Try using the doughnut to sit on as we discussed.  Also try getting off of the sacral area every couple of hours. Keep the area as dry as possible.  If you notice any worsening at all, let me know.  Remember to try to focus on things that you still get joy from instead of the things that you cannot do.     Pressure Injury  A pressure injury is damage to the skin and underlying tissue that results from pressure being applied to an area of the body. It often affects people who must spend a long time in a bed or chair because of a medical condition. Pressure injuries usually occur:  Over bony parts of the body, such as the tailbone, shoulders, elbows, hips, heels, spine, ankles, and back of the head.  Under medical devices that make contact with the body, such as respiratory equipment, stockings, tubes, and splints. Pressure injuries start as reddened areas on the skin and can lead to pain and an open wound. What are the causes? This condition is caused by frequent or constant pressure to an area of the body. Decreased blood flow to the skin can eventually cause the skin tissue to die and break down, causing a wound. What increases the risk? You are more likely to develop this condition if you:  Are in the hospital or an extended care facility.  Are bedridden or in a wheelchair.  Have an injury or disease that keeps you from: ? Moving normally. ? Feeling pain or pressure.  Have a condition that: ? Makes you sleepy or less alert. ? Causes poor blood flow.  Need to wear a medical device.  Have poor control of your bladder or bowel functions (incontinence).  Have poor nutrition (malnutrition). If you are at risk for pressure injuries, your health care provider may recommend certain types of mattresses, mattress covers,  pillows, cushions, or boots to help prevent them. These may include products filled with air, foam, gel, or sand. What are the signs or symptoms? Symptoms of this condition depend on the severity of the injury. Symptoms may include:  Red or dark areas of the skin.  Pain, warmth, or a change of skin texture.  Blisters.  An open wound. How is this diagnosed? This condition is diagnosed with a medical history and physical exam. You may also have tests, such as:  Blood tests.  Imaging tests.  Blood flow tests. Your pressure injury will be staged based on its severity. Staging is based on:  The depth of the tissue injury, including whether there is exposure of muscle, bone, or tendon.  The cause of the pressure injury. How is this treated? This condition may be treated by:  Relieving or redistributing pressure on your skin. This includes: ? Frequently changing your position. ? Avoiding positions that caused the wound or that can make the wound worse. ? Using specific bed mattresses, chair cushions, or protective boots. ? Moving medical devices from an area of pressure, or placing padding between the skin and the device. ? Using foams, creams, or powders to prevent rubbing (friction) on the skin.  Keeping your skin clean and dry. This may include using a skin cleanser or skin barrier as told by your health care provider.  Cleaning your injury and removing  any dead tissue from the wound (debridement).  Placing a bandage (dressing) over your injury.  Using medicines for pain or to prevent or treat infection. Surgery may be needed if other treatments are not working or if your injury is very deep. Follow these instructions at home: Wound care  Follow instructions from your health care provider about how to take care of your wound. Make sure you: ? Wash your hands with soap and water before and after you change your bandage (dressing). If soap and water are not available, use hand  sanitizer. ? Change your dressing as told by your health care provider.  Check your wound every day for signs of infection. Have a caregiver do this for you if you are not able. Check for: ? Redness, swelling, or increased pain. ? More fluid or blood. ? Warmth. ? Pus or a bad smell. Skin care  Keep your skin clean and dry. Gently pat your skin dry.  Do not rub or massage your skin.  You or a caregiver should check your skin every day for any changes in color or any new blisters or sores (ulcers). Medicines  Take over-the-counter and prescription medicines only as told by your health care provider.  If you were prescribed an antibiotic medicine, take or apply it as told by your health care provider. Do not stop using the antibiotic even if your condition improves. Reducing and redistributing pressure  Do not lie or sit in one position for a long time. Move or change position every 1-2 hours, or as told by your health care provider.  Use pillows or cushions to reduce pressure. Ask your health care provider to recommend cushions or pads for you. General instructions  Eat a healthy diet that includes lots of protein.  Drink enough fluid to keep your urine pale yellow.  Be as active as you can every day. Ask your health care provider to suggest safe exercises or activities.  Do not abuse drugs or alcohol.  Do not use any products that contain nicotine or tobacco, such as cigarettes, e-cigarettes, and chewing tobacco. If you need help quitting, ask your health care provider.  Keep all follow-up visits as told by your health care provider. This is important.   Contact a health care provider if:  You have: ? A fever or chills. ? Pain that is not helped by medicine. ? Any changes in skin color. ? New blisters or sores. ? Pus or a bad smell coming from your wound. ? Redness, swelling, or pain around your wound. ? More fluid or blood coming from your wound.  Your wound does not  improve after 1-2 weeks of treatment. Summary  A pressure injury is damage to the skin and underlying tissue that results from pressure being applied to an area of the body.  Do not lie or sit in one position for a long time. Your health care provider may advise you to move or change position every 1-2 hours.  Follow instructions from your health care provider about how to take care of your wound.  Keep all follow-up visits as told by your health care provider. This is important. This information is not intended to replace advice given to you by your health care provider. Make sure you discuss any questions you have with your health care provider. Document Revised: 08/04/2017 Document Reviewed: 08/04/2017 Elsevier Patient Education  [place].

## 2020-03-20 NOTE — Progress Notes (Signed)
Subjective:    Patient ID: Leonard [name], male    DOB: 03/23/1945, 75 y.o.   MRN: [id number]  HPI Chief Complaint  Patient presents with  . pressure sore on buttock   He is here with his wife. Ambulating with a walker. Complains of pressure sore on right buttock for the past several weeks.  He is using a zinc cream that his wife bought at the medical supply store for skin breakdown. States the area actually looks better. His wife bought a doughnut and he has not started using it yet.   States he had a 2 week history of diarrhea that has stopped. Normal bowel movements now per patient and his wife.  States the diarrhea started after eating an [place] sub. He also has had some nausea. This is mild. No vomiting.   TSH still elevated. We increased his levothyroxine dose and he started the higher dose 2 days ago.   States he has been feeling down recently due to his immobility and not being able to do things he was once able to do. Taking Effexor.    Denies fever, chills, dizziness, chest pain, palpitations, shortness of breath, abdominal pain.   Reviewed allergies, medications, past medical, surgical, family, and social history.    Review of Systems Pertinent positives and negatives in the history of present illness.     Objective:   Physical Exam Constitutional:      General: He is not in acute distress.    Appearance: He is not ill-appearing.  Cardiovascular:     Rate and Rhythm: Normal rate.  Pulmonary:     Effort: Pulmonary effort is normal.  Genitourinary:      Comments: Small circular area of skin breakdown, stage 1 only, on the right inner buttock. Slightly erythematous to inner buttocks and sacral area. No other skin breakdown noted. No area of induration or fluctuance. No drainage. No sign of infection.  Skin:    General: Skin is warm and dry.  Neurological:     General: No focal deficit present.     Mental Status: He is alert and oriented to person, place,  and time.    BP 112/78   Pulse 72   Temp 97.8 F (36.6 C)   Wt 246 lb 9.6 oz (111.9 kg)   SpO2 99%   BMI 33.91 kg/m       Assessment & Plan:  Pressure injury of right buttock, stage 1 - Plan: CBC with Differential/Platelet -Discussed that currently there is no sign of infection.  Continue using zinc oxide cream and discussed applying a thicker layer.  They have purchased a doughnut that he will start using when sitting.  Discussed when he is lying in bed to try to get off of his back and onto his sides more often.  Discussed keeping the area clean and dry.  Suspect the diarrhea a few weeks ago make this area worse.  The diarrhea has stopped. Recent elevated white blood cell count of 17,000.  I will recheck this today.  This may have been due to the diarrhea and GI symptoms as opposed to the pressure sore.  Diarrhea, unspecified type - Plan: CBC with Differential/Platelet, Comprehensive metabolic panel -Resolved.  Nausea - Plan: Comprehensive metabolic panel  Hypothyroidism, unspecified type -He started on higher dose of levothyroxine 2 days ago.  He will return to see me in 4 to 6 weeks and we will recheck his thyroid function.  Stage 3 chronic kidney disease, unspecified whether  stage 3a or 3b CKD ([place]) - Plan: Comprehensive metabolic panel -Continue to monitor and refer back to nephrology as needed.

## 2020-03-21 LAB — CBC WITH DIFFERENTIAL/PLATELET
Basophils Absolute: 0.1 10*3/uL (ref 0.0–0.2)
Basos: 1 %
EOS (ABSOLUTE): 0.5 10*3/uL — ABNORMAL HIGH (ref 0.0–0.4)
Eos: 5 %
Hematocrit: 43 % (ref 37.5–51.0)
Hemoglobin: 13.9 g/dL (ref 13.0–17.7)
Immature Grans (Abs): 0.1 10*3/uL (ref 0.0–0.1)
Immature Granulocytes: 1 %
Lymphocytes Absolute: 1.6 10*3/uL (ref 0.7–3.1)
Lymphs: 14 %
MCH: 29.4 pg (ref 26.6–33.0)
MCHC: 32.3 g/dL (ref 31.5–35.7)
MCV: 91 fL (ref 79–97)
Monocytes Absolute: 0.7 10*3/uL (ref 0.1–0.9)
Monocytes: 6 %
Neutrophils Absolute: 8.4 10*3/uL — ABNORMAL HIGH (ref 1.4–7.0)
Neutrophils: 73 %
Platelets: 369 10*3/uL (ref 150–450)
RBC: 4.72 x10E6/uL (ref 4.14–5.80)
RDW: 15.5 % — ABNORMAL HIGH (ref 11.6–15.4)
WBC: 11.3 10*3/uL — ABNORMAL HIGH (ref 3.4–10.8)

## 2020-03-21 LAB — COMPREHENSIVE METABOLIC PANEL
ALT: 17 IU/L (ref 0–44)
AST: 23 IU/L (ref 0–40)
Albumin/Globulin Ratio: 1.4 (ref 1.2–2.2)
Albumin: 3.9 g/dL (ref 3.7–4.7)
Alkaline Phosphatase: 100 IU/L (ref 44–121)
BUN/Creatinine Ratio: 15 (ref 10–24)
BUN: 28 mg/dL — ABNORMAL HIGH (ref 8–27)
Bilirubin Total: 1.3 mg/dL — ABNORMAL HIGH (ref 0.0–1.2)
CO2: 19 mmol/L — ABNORMAL LOW (ref 20–29)
Calcium: 9.3 mg/dL (ref 8.6–10.2)
Chloride: 98 mmol/L (ref 96–106)
Creatinine, Ser: 1.83 mg/dL — ABNORMAL HIGH (ref 0.76–1.27)
Globulin, Total: 2.7 g/dL (ref 1.5–4.5)
Glucose: 106 mg/dL — ABNORMAL HIGH (ref 65–99)
Potassium: 4.8 mmol/L (ref 3.5–5.2)
Sodium: 136 mmol/L (ref 134–144)
Total Protein: 6.6 g/dL (ref 6.0–8.5)
eGFR: 38 mL/min/{1.73_m2} — ABNORMAL LOW (ref >59)

## 2020-03-25 ENCOUNTER — Other Ambulatory Visit: Payer: Self-pay

## 2020-03-25 ENCOUNTER — Ambulatory Visit (INDEPENDENT_AMBULATORY_CARE_PROVIDER_SITE_OTHER): Payer: Medicare Other | Admitting: Cardiology

## 2020-03-25 VITALS — BP 90/57 | HR 71 | Ht 73.0 in | Wt 243.0 lb

## 2020-03-25 DIAGNOSIS — I9589 Other hypotension: Secondary | ICD-10-CM

## 2020-03-25 DIAGNOSIS — I5042 Chronic combined systolic (congestive) and diastolic (congestive) heart failure: Secondary | ICD-10-CM | POA: Diagnosis not present

## 2020-03-25 DIAGNOSIS — I255 Ischemic cardiomyopathy: Secondary | ICD-10-CM | POA: Diagnosis not present

## 2020-03-25 DIAGNOSIS — I2510 Atherosclerotic heart disease of native coronary artery without angina pectoris: Secondary | ICD-10-CM | POA: Diagnosis not present

## 2020-03-25 DIAGNOSIS — I447 Left bundle-branch block, unspecified: Secondary | ICD-10-CM | POA: Diagnosis not present

## 2020-03-25 DIAGNOSIS — F1010 Alcohol abuse, uncomplicated: Secondary | ICD-10-CM

## 2020-03-25 DIAGNOSIS — I472 Ventricular tachycardia, unspecified: Secondary | ICD-10-CM

## 2020-03-25 DIAGNOSIS — I2129 ST elevation (STEMI) myocardial infarction involving other sites: Secondary | ICD-10-CM

## 2020-03-25 DIAGNOSIS — Z95810 Presence of automatic (implantable) cardiac defibrillator: Secondary | ICD-10-CM

## 2020-03-25 DIAGNOSIS — Z79899 Other long term (current) drug therapy: Secondary | ICD-10-CM

## 2020-03-25 DIAGNOSIS — E785 Hyperlipidemia, unspecified: Secondary | ICD-10-CM

## 2020-03-25 DIAGNOSIS — I10 Essential (primary) hypertension: Secondary | ICD-10-CM

## 2020-03-25 MED ORDER — AMIODARONE HCL 200 MG PO TABS: 200.0000 mg | ORAL_TABLET | Freq: Every day | ORAL | 3 refills | Status: DC

## 2020-03-25 NOTE — Patient Instructions (Addendum)
Medication Instructions:   Do not take your blood pressure medication today - spironolactone , Entresto   Stop taking Metoprolol 25 mg   Decrease  Amiodarone to 200 mg ( one tablet) daily    *If you need a refill on your cardiac medications before your next appointment, please call your pharmacy*   Lab Work: Not needed   Testing/Procedures:  Not needed  Follow-Up: At [facility], you and your health needs are our priority.  As part of our continuing mission to provide you with exceptional heart care, we have created designated Provider Care Teams.  These Care Teams include your primary Cardiologist (physician) and Advanced Practice Providers (APPs -  Physician Assistants and Nurse Practitioners) who all work together to provide you with the care you need, when you need it.     Your next appointment:   3 to 4  month(s)  The format for your next appointment:   In Person  Provider:   Glenetta Hew, MD   Other Instructions  You have been referred to Dr Caryl Comes to discuss Bi- ventricular  ICD

## 2020-03-25 NOTE — Progress Notes (Signed)
Primary Care Provider: Girtha Rm, NP-C Cardiologist: Glenetta Hew, MD Electrophysiologist: Virl Axe, MD  Clinic Note: Chief Complaint  Patient presents with  . Congestive Heart Failure    Weights are back down some after increased diuretic, feels very weak and tired.  Lethargic  . Follow-up    8-month  . Coronary Artery Disease    No angina    ===================================  ASSESSMENT/PLAN   Problem List Items Addressed This Visit    Excessive drinking of alcohol    This is an intermittent issue with him.  He seems to be doing fine right now but does have setbacks.  I think his depression takes effect and makes him drink.  Unfortunately, this has put him in a tailspin on occasion.      LBBB (left bundle branch block)    Longstanding IVCD now more like i LBBB.  I suspect this is playing a role of his reduced EF.   Referral back to EP to consider CRT-D      Relevant Medications   amiodarone (PACERONE) 200 MG tablet   Hypotension    Low blood pressure today.  We are discontinuing Toprol to avoid pacing.  We will also have him hold Entresto and spironolactone.  We have set hold parameters for BP meds.      Relevant Medications   amiodarone (PACERONE) 200 MG tablet   ST elevation myocardial infarction (STEMI) of true posterior wall, subsequent episode of care [facility]) (Chronic)    Distant history of MI 4-1/2 years out.  Post MI EF was only 40 to 45% that improved to 50-55%.  Despite having patent stents on follow-up cath, his EF has dropped progressively.  Concerned that LBBB is playing a role.      Relevant Medications   amiodarone (PACERONE) 200 MG tablet   Coronary artery disease involving native coronary artery without angina pectoris (Chronic)    History of pretty extensive inferior STEMI with DES PCI with 2 overlapping stents in the RCA with PTCA of the PDA.  Has had to follow cath with patent stents.  Plan:   Continue maintenance  ticagrelor g (60 mg twice daily) iven the extent of stent. => Okay to hold ticagrelor/Brilinta 5 days preop for surgeries or procedures.  He is on high-dose statin which we will continue.  Also on co-Q10 and Zetia.  On low-dose beta-blocker plus amiodarone.  With him being fatigued and hypotensive, I will have him simply stop Toprol.-  He is still on amiodarone which will reduce down to 200 mg daily.  (For A. fib suppression)  He is on Entresto low-dose, but has borderline pressures.      Relevant Medications   amiodarone (PACERONE) 200 MG tablet   Other Relevant Orders   EKG 12-Lead (Completed)   Essential hypertension (Chronic)    Not an issue today.  In fact he is hypotensive.  We will stop his Toprol.  Hold antihypertensives for blood pressure less than 100 mmHg.  Would like to get him back on Entresto and spironolactone since possible.      Relevant Medications   amiodarone (PACERONE) 200 MG tablet   Hyperlipidemia LDL goal <70 (Chronic)    Most recent labs from October showed LDL of 76.  This is not quite we wanted to be, but right now we are dealing with issues of volume overload He is already on atorvastatin 80 mg plus  Zetia and co-Q10.  I think PCSK9 inhibitor may be a reasonable  option to be discussed once we have the issue of CRT-D addressed..      Relevant Medications   amiodarone (PACERONE) 200 MG tablet   Ischemic cardiomyopathy (Chronic)    Interestingly, he has ischemic cardiomyopathy by reduced EF following MI but his EF was improved after PCI to 50 to 55%.  Subsequently EF levels have shown dropping all the way down to 15-20% which in no short part is related to LBBB/IVCD.  There is no other indication as to why is this would be.  He has had 2 follow-up cath showing patent stents.  Reduced EF is way out of portion to the extent of his MI.  Referred back to EP to discuss CRT-D      Relevant Medications   amiodarone (PACERONE) 200 MG tablet   Chronic combined  systolic and diastolic CHF (congestive heart failure) (HCC) - Primary (Chronic)    Baseline NYHA class II CHF symptoms with intermittently class Leonard [name].  Reitnauer limited because of his low blood pressure.  He has been on low-dose Entresto very low-dose Toprol along with standing dose torsemide and spironolactone.  He had a setback in November with weight gain that has now been reduced back to his previous baseline.  He does have signs of still having volume overload with some JVD and mild edema.  His renal function does show a slight upturn which may be what is required to keep him adequately diuresed.  On distal be concerned we may have overshot with his pressures.  Plan: We will DC Toprol because of fatigue and low blood pressures.  He is already on amiodarone which gives beta-blocker effect. => Reduced dose of amiodarone to 200 mg daily. -> Hold parameters for Entresto and spironolactone will be for systolic pressure less than 100 mmHg.  Continue Entresto, but have him hold his medication today because of low blood pressure.    Similarly, he will hold his spironolactone today and restart.  For now he is on torsemide 20 mg daily and seems to be at reasonable volume level for now based on his creatinine going up.  Continue to monitor daily weights closely.  Adjust accordingly.  Continue digoxin, but need to be careful with potentially worsening renal function.  Appropriately treat hypothyroidism.  I am going to have him referred back to Dr. Caryl Comes to discuss the possibility of CRT-D or CRT-P.  This was given be delayed due to Covid but now that Covid issues are settling down, I think we need to address this issue.  He is starting to show signs of progressive failure.  I wonder if some of his reduced EF could be related to LBBB and RV lead pacing.      Relevant Medications   amiodarone (PACERONE) 200 MG tablet   S/P implantation of automatic cardioverter/defibrillator (AICD) (Chronic)     Referring back to EP to discuss CRT-D      Relevant Orders   EKG 12-Lead (Completed)   VT (ventricular tachycardia) (HCC) (Chronic)    For now he is on amiodarone for suppression. ICD in place.  Referral for CRT-D      Relevant Medications   amiodarone (PACERONE) 200 MG tablet   Other Relevant Orders   EKG 12-Lead (Completed)   On amiodarone therapy (Chronic)    I am little bit concerned that the have not adjusted his thyroid replacement dosing.-Increase Synthroid would suggest hypothyroid effect from amiodarone.    Plan: Reduce to 200 mg daily amiodarone.  Relevant Orders   EKG 12-Lead (Completed)      ===================================  HPI:    Leonard Leonard [name] is a 75 y.o. male with a complicated cardiac history noted who presents today for 69-month follow-up.  CARDIAC HISTORY CAD   July 2017-History large inferior MI complicated by VT: CAD with PCI, to the RCA and PTCA to RPDA; initial EF 45 to 50%. ? 8//2017: Admitted for Sustained VT / ISCHEMIC CARDIOMYOPATHY,   Cath - Patent RCA stents. High LVEDP (24 mmHg, EF ~35-40%). Ost LAD ~50-60%. -- Noted LBBB  > EPS with PMVT--Saint Jude ICD (followed by Dr. Caryl Comes); he has underlying left bundle branch block --> ? NOT CRTD at that time because EF was 45 to 50%.  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 ? 05/2016 -- R&LHC; patent RCA stents, High LVEDP with Mild Pulm V HTN, CO/CI 3.89 /1.5LOW) ? Echo 05/2016 -> EF of 20% to 25%, Gr II DD. Severe diffuse HK - disproportionately severe hypokinesis and thinning of the apex anterior septum anterior wall and septal dyssynchrony with EPAP of 53 mmHg.  Referred to Adv CHF = Dr. Haroldine Laws  Echo January 2019-EF 15 to 20%.  Severely reduced EF.  GRII DD.  Moderate MR.  Mild to  moderate the elevated PA P.  02/23/2018-Hospitalized for A-on-C HFrEF - BIV FAILURE complicated by A-on-CRF ? IV Diuresis - converted to Torsemide ? 2/2 RF: Dig, Mearl Latin held.  No BB 2/2 Low CO.  ? Discharge weight 265.9 (120.9 kg)  Was not seen again until November 2020 by Tommye Standard, PA (EP) -> ambulating at home with a cane.  Mostly orthopedic issues.  Unable to stand with assistance.  (Was still drinking 5-6 drinks of scotch a night): Was on Entresto, beta-blocker and furosemide as well as Aldactone but only taking it 3 days a week.  Also was theoretically on digoxin. ->  Furosemide converted to torsemide 80 mg twice daily and spironolactone increased to 25 mg.  Told to reduce alcohol.  January 19, 2019-Dr. Caryl Comes: Noted that he is feeling better mild dyspnea, but quite active.  "No peripheral edema "--lost 20 to 25 pounds..   Decided to defer conversion to CRT-D until Covid concerns are over. ->  Recommend using resting elliptical trainer.  Ren Aspinall Leonard [name] was last seen on September 26, 2019-doing well.  Feeling great.  Was recovering from his hospitalization and felt very good.  Starting exercise.  Recent Hospitalizations: None  On November 30, 2019, had his wife contact the office indicating that he had been having worsening congestion and coughing.  Some sinus congestion gained 14 pounds since October 11.  He had been drinking more scotch whiskey which may be part of the reason. ->  He then contributed to eating and snacking for watching football in the [place].  Was not noting any significant dyspnea.  Usually only occasionally at night.  Also notes orthopnea.  Has not noted any significant edema. => Recommendation was to increase torsemide for a few days to see if we can reduce his weight.  We then want to recheck chemistry panel. => On November 15 call back and noted his weight was back down to 254 pounds.  He had cut scotch out completely.  He was dealing with a cold,  that he "could not shake ".  Sleeps in the chair for comfort.  Noted some swelling. => Recommended intermittent increased doses of furosemide every couple days to increase weight loss.  Reviewed  CV studies:    The following studies were reviewed today: (if available, images/films reviewed: From Epic Chart or Care Everywhere) . No new studies:   Interval History:   Faisal Stradling Leonard [name] is here today with his wife.  He is definitely feeling down.  Seems pretty depressed.  He is still quite short of breath doing this but any routine activities.  He does his ADLs but just slower.  He is having issues with increased prostate which makes it difficult to fully void.  He is also being seen by nephrology  He does have some orthopnea sleeping in a chair and also has some PND.  When he sleeps in the bed he sleeps on one pillow but had to sit up soon.  He wakes at least 1 or 2 times a night to urinate and is short of breath when he walks to the bathroom.  He feels "wobbly and unsteady "when he gets up.  It is one of the reasons why he sleeps in a chair because it is easier to get up.  He has not had any syncope.  He does get off-and-on nausea.  He is not sure how much of his lack of activity is is related to depression and how much is due to shortness of breath.  His thyroid dose has been recently increased.  He tells me despite all this shortness of breath and weight change, he is really only taking 1 torsemide tablet a day and a second half tablet of spironolactone.  CV Review of Symptoms (Summary): positive for - dyspnea on exertion, edema, orthopnea, palpitations, paroxysmal nocturnal dyspnea, shortness of breath and Lightheadedness and dizziness, but no near syncope. negative for - chest pain, loss of consciousness, rapid heart rate or TIA/amaurosis fugax, claudication  The patient does not have symptoms concerning for COVID-19 infection (fever, chills, cough, or new shortness of breath).   REVIEWED  OF SYSTEMS   Review of Systems  Constitutional: Positive for malaise/fatigue (Not sure how much of this due to fatigue with hypothyroidism, but could also be somewhat related to anhedonia.) and weight loss (Weight is down since increasing diuretic dosing.  Also not eating as well.).  HENT: Negative for congestion and nosebleeds.   Respiratory: Positive for cough. Negative for sputum production. Shortness of breath: With exertion.   Gastrointestinal: Negative for blood in stool and melena.       Poor appetite  Genitourinary: Positive for frequency (But does not completely void). Negative for hematuria.  Musculoskeletal: Positive for joint pain. Negative for falls.  Neurological: Positive for dizziness and weakness (Generalized wobbly, legs weak). Negative for headaches.  Psychiatric/Behavioral: Positive for depression and memory loss. The patient is not nervous/anxious and does not have insomnia (Not really insomnia, just difficulty falling back asleep when he has to get up to urinate at night).        He indicates that he has not been drinking as much scotch now.   I have reviewed and (if needed) personally updated the patient's problem list, medications, allergies, past medical and surgical history, social and family history.   PAST MEDICAL HISTORY   Past Medical History:  Diagnosis Date  . AICD (automatic cardioverter/defibrillator) present 08/26/2015   STJ  . Anxiety   . Atherosclerotic heart disease of native coronary artery with angina pectoris ([place]) 07/2015   a.  07/2015 Posterior STEMI/PCI: LM nl, LAd 40ost, RI 40, RCA 171m (3.0x22 Resolute Integrity DES distal, 3.0x12 Resolute Integrity DES prox), 90d (PTCA), RPDA small, nl, RPLB2 100 - too small for PTCA. -- patent in f/u cath 08/2015 & 05/2016 - patent RCA stents (~10%), RI 40%, LM ~30% ost LAD ~40%.  . Chronic combined systolic and diastolic CHF (congestive heart failure) ([place]) 07/2015   a. 07/2015 [facility]co: EF 45-50%, basal-mid inf and  infsept AK, inflat, apical inf, and apical septal HK, Gr1 DD, triv MR.;; b - Echo 05/2016 -> EF 20-25%, Gr 2 DD.  Severe diffuse HK disproportionately severe HK and thinning of apex, anterior septum and anterior wall-with septal dyssynchrony.  PAP ~53 mmHg.  [name] Hyperlipidemia with target low density lipoprotein (LDL) cholesterol less than 70 mg/dL   . Hypertensive heart disease   . Ischemic cardiomyopathy 07/2015 - 05/2016   a. 07/2015 Echo: EF 45-50%.; Follow-up Echo 08/23/2015: EF 50-55% with mild inferior hypokinesis --> 05/2016: EF 20-25%, diffuse HK. PAP ~53 mmHg.  . Kidney disease, chronic, stage Leonard [name] (GFR 30-59 ml/min) ([place]) 08/12/2015  . Morbid obesity ([place])   . ST elevation myocardial infarction (STEMI) of inferior wall ([place]) 08/12/2015   Subacute presentation for inferior MI with post infarction angina on the following day. EKG still shows injury current. Therefore decided to proceed with intervention of 100% mRCA thrombotic lesion.  . Sustained ventricular fibrillation ([place]) 08/22/2015   Underwent EP evaluation with final ICD implantation.    PAST SURGICAL HISTORY   Past Surgical History:  Procedure Laterality Date  . CARDIAC CATHETERIZATION N/A 08/12/2015   Procedure: Left Heart Cath and Coronary Angiography;  Surgeon: Leonie Man, MD;  Location: [facility];  Service: Cardiovascular: Inferior STEMI: 100% mid RCA, 90% distal RCA, 100% RPL. 40% ostial and proximal LAD and branch of ramus.  [name] CARDIAC CATHETERIZATION N/A 08/26/2015   Procedure: Left Heart Cath and Coronary Angiography;  Surgeon: Belva Crome, MD;  Location: [facility];  Service: Cardiovascular: To evaluate sustained VT. Widely patent RCA stent and distal PTCA site. Also patent RPL branch. LAD lesion appeared to be more consistent with 50-55% and 40%. Otherwise stable from previous cath. EF estimated 35-45%.  [name] CARDIOVERSION N/A 08/23/2015   Procedure: CARDIOVERSION;  Surgeon: Deboraha Sprang, MD;  Location: [facility];  Service: Cardiovascular;  Laterality: N/A;  . CORONARY ANGIOPLASTY WITH STENT PLACEMENT  08/12/2015   Mid RCA 100% reduced to 0% with 2 overlapping Resolute DES.  3.0 x 22 mm with a 3.0 x 12 mm prox overlarp (postdilated to 3.4 mm); PTCA of dRCA 90%. (Very dificult,complex case - tortuous, [place] RCA - unable to advance longer stents.  RPL2 noted to have thromboembolic 401% occlusion - unable to reach.  . ELECTROPHYSIOLOGIC STUDY N/A 08/26/2015   Procedure: Electrophysiology Study;  Surgeon: Deboraha Sprang, MD;  Location: [facility];  Service: Cardiovascular;  Laterality: N/A;  . EP IMPLANTABLE DEVICE N/A 08/26/2015   Procedure: ICD Implant;  Surgeon: Deboraha Sprang, MD;  Location: [facility];  Service: Cardiovascular;  Laterality: LEFT:  St Jude ICD, serial number [id number].   [name] HEMORRHOID SURGERY    . LEA DOPPLERS  06/2019   Lower Extremity Arterial Doppler: Right-mild lower extremity arterial disease with abnormal TBI.  Moderate Left PAD-abnormal TBI.  [name] Lower Extr V Dopplers  04/2019   Lower Venous Reflux Study.:  No evidence of obvious DVT bilaterally.  Reflux noted in Right  CFV, GSV in the thigh as well as calf, as well as SSV.  Venous reflux noted in the Left GSV in the thigh, but no evidence in SSV or CFV.  [name] RIGHT/LEFT HEART CATH AND CORONARY ANGIOGRAPHY N/A 06/10/2016   Procedure: Right/Left Heart Cath and Coronary Angiography;  Surgeon: Martinique, Peter M, MD;  Location: [facility] INVASIVE CV LAB:  Nonobstructive CAD. RCA stents PATENT.  Ost-Prox RCA, 10 %. Ost LM 30 %. Ost LAD 40 %. RI, 40 %; High PCWP  & LVEDP (23 mmHg) ; PAP 43/23/31 mmHg. CO/CI 3.89 / 1/5 (Severely Reduced)  . SKIN SURGERY    . TRANSTHORACIC ECHOCARDIOGRAM  08/13/2015; 08/23/2015   a. Mild concentric LVH. EF 45-50%. Basal-mid inferior and inferoseptal akinesis with hypokinesis of inferolateral and apical inferior wall. 1 DD. ;; b. Technically difficult study. Definity contrast administered.  Compared to a  prior echo in 07/2015, the LVEF is slightly higher at 50-55% with mild inferior hypokinesis.  . TRANSTHORACIC ECHOCARDIOGRAM  06/09/2016   EF  20-25%, GR 2 DD. Severe diffuse  hypokinesis with distinct regional wall motion abnormalities.   There is disproportionately severe hypokinesis and thinning of the apex, anterior septum and anterior wall (septal dyssynchrony). However, there is severe dyssynchrony , making it difficult to assess regional function.  PA P ~53 mmHg  . TRANSTHORACIC ECHOCARDIOGRAM  01/2017   Severely reduced EF.  GI to DD.  Moderate MR.  Mild to have a moderately elevated PAP (peak 79mmHg)  . TRANSTHORACIC ECHOCARDIOGRAM  11/2018    EF 15-20%.  Mild dilation.  Global HK.  Moderate RV enlargement with mildly reduced function.  Severe biatrial enlargement.  Moderate pleural effusion.  Moderate MAP:.  Moderate aortic sclerosis.  Mild ascending aortic dilation-41 mm.  Increased CVP estimated 15 mmHg.   05/31/2016    Immunization History  Administered Date(s) Administered  . Fluad Quad(high Dose 65+) 09/16/2018, 10/30/2019  . Influenza, High Dose Seasonal PF 10/14/2015, 10/06/2016, 10/20/2017  . Influenza-Unspecified 10/20/2014  . PFIZER(Purple Top)SARS-COV-2 Vaccination 03/15/2019, 04/13/2019, 10/30/2019  . Pneumococcal Conjugate-13 10/06/2016  . Pneumococcal Polysaccharide-23 07/21/2012, 08/13/2015  . Td 05/20/2007, 08/26/2010  . Tdap 02/12/2015  . Zoster 07/21/2012  . Zoster Recombinat (Shingrix) 06/22/2018, 09/16/2018    MEDICATIONS/ALLERGIES   Current Meds  Medication Sig  . acetaminophen (TYLENOL) 325 MG tablet Take 2 tablets (650 mg total) by mouth every 4 (four) hours as needed for headache or mild pain.  [name] amiodarone (PACERONE) 200 MG tablet Take 1 tablet (200 mg total) by mouth daily.  [name] atorvastatin (LIPITOR) 80 MG tablet Take 1 tablet (80 mg total) by mouth daily. TAKE 1 TABLET BY MOUTH DAILY AT 6:00 PM.  . Cholecalciferol (VITAMIN D) 50 MCG (2000 UT) CAPS  Take 0.0005 capsules (1 Units total) by mouth daily.  . Coenzyme Q10 (COQ10) 200 MG CAPS Take 200 mg by mouth daily.  . digoxin (LANOXIN) 0.125 MG tablet Take 1 tablet (125 mcg total) by mouth daily.  [name] ENTRESTO 24-26 MG TAKE 1 TABLET BY MOUTH TWICE DAILY  . ezetimibe (ZETIA) 10 MG tablet Take 1 tablet (10 mg total) by mouth daily.  [name] levothyroxine (SYNTHROID) 112 MCG tablet Take 1 tablet (112 mcg total) by mouth daily.  . Multiple Vitamins-Minerals (MULTIVITAMIN ADULT EXTRA C PO) TAKE 1 TABLET BY MOUTH DAILY  . spironolactone (ALDACTONE) 25 MG tablet TAKE 1 TABLET(25 MG) BY MOUTH DAILY  . ticagrelor (BRILINTA) 60 MG TABS tablet Take 1 tablet (60 mg total) by mouth 2 (two) times daily.  [name]  torsemide (DEMADEX) 20 MG tablet Take 1 tablet (20 mg total) by mouth daily.  [name] venlafaxine (EFFEXOR) 75 MG tablet TAKE 1 TABLET(75 MG) BY MOUTH DAILY  . [DISCONTINUED] amiodarone (PACERONE) 200 MG tablet Take 1.5 tablets (300 mg total) by mouth daily.    Allergies  Allergen Reactions  . Clams [Shellfish Allergy] Shortness Of Breath, Itching and Swelling  . Shellfish Allergy Anaphylaxis  . Shellfish-Derived Products Shortness Of Breath, Itching and Swelling  . Sulfa Antibiotics Other (See Comments)    unknown  . Other Itching and Other (See Comments)    Ragweed causes watery eyes, itching, sneezing  . Sulfa Antibiotics Other (See Comments)    Pt unsure of reaction, mother told him he was allergic     SOCIAL HISTORY/FAMILY HISTORY   Reviewed in Epic:  Pertinent findings:  Social History   Tobacco Use  . Smoking status: Never Smoker  . Smokeless tobacco: Never Used  Vaping Use  . Vaping Use: Never used  Substance Use Topics  . Alcohol use: Not Currently    Alcohol/week: 3.0 - 4.0 standard drinks    Types: 3 - 4 Shots of liquor per week    Comment: Up until 05-02-19 spring-3-4 glasses of scotch 3-4 days per week  . Drug use: No   Social History   Social History Narrative   He tells me that  his daughter died on Halloween 05/02/15. She had a diving accident at the age of 48 and been quadriplegic for 25 years. She had been incredibly vigorous girl. He showed me her high school graduation picture.She was as he reported beautiful.  Her name was Leonard Leonard [name].      He still lives with his wife, [name].  He does have a living will and [name] is his Ambulance person.    OBJCTIVE -PE, EKG, labs   Wt Readings from Last 3 Encounters:  03/25/20 243 lb (110.2 kg)  03/20/20 246 lb 9.6 oz (111.9 kg)  10/30/19 249 lb 3.2 oz (113 kg)    Physical Exam: BP (!) 90/57   Pulse 71   Ht 6\' 1"  (1.854 m)   Wt 243 lb (110.2 kg)   SpO2 98%   BMI 32.06 kg/m  Physical Exam Vitals reviewed.  Constitutional:      General: He is not in acute distress.    Appearance: He is obese. He is ill-appearing (He does not seem well today.  Seems a little bit ill but not acutely). He is not toxic-appearing.     Comments: Seems somewhat unkempt, not well-groomed.  Seems very down  HENT:     Head: Normocephalic and atraumatic.  Neck:     Vascular: Hepatojugular reflux and JVD (Somewhat elevated JVP of roughly 7 to 8 cm H2O) present. No carotid bruit.  Cardiovascular:     Rate and Rhythm: Normal rate and regular rhythm. Occasional extrasystoles are present.    Chest Wall: PMI is displaced (And sustained).     Pulses: Decreased pulses (Difficult to palpate due to thick skin and pedal edema.).     Heart sounds: S2 normal. Heart sounds are distant. Murmur heard.  High-pitched harsh crescendo-decrescendo midsystolic murmur is present with a grade of 2/6 at the upper right sternal border radiating to the neck. Gallop present. S4 (S4 versus prominently split S2) sounds present.      Comments: Normal S1 with split S2 Pulmonary:     Effort: Pulmonary effort is normal. No respiratory distress.     Comments: Diffuse mild  interstitial lung sounds. Chest:     Chest wall: No tenderness.  Musculoskeletal:        General:  Swelling (Bilateral legs at least 1+ with pitting edema) present.     Cervical back: Normal range of motion and neck supple.  Skin:    General: Skin is warm and dry.     Comments: Venostasis changes with diffuse brawny discoloration.  Neurological:     General: No focal deficit present.     Mental Status: He is alert and oriented to person, place, and time.     Motor: Weakness (Legs seem weak) present.     Gait: Gait abnormal (Very slow unsteady; uses rolling walker.).  Psychiatric:        Thought Content: Thought content normal.        Judgment: Judgment normal.     Comments: Depressed mood.  Somewhat subdued.  Not very vocal.       Adult ECG Report  Rate: 71 ;  Rhythm: normal sinus rhythm and LBBB.  Otherwise normal axis, intervals and durations.;   Narrative Interpretation: Stable  Recent Labs: Reviewed due to have lab recheck in a couple months. Lab Results  Component Value Date   CHOL 141 10/30/2019   HDL 42 10/30/2019   LDLCALC 76 10/30/2019   TRIG 129 10/30/2019   CHOLHDL 3.4 10/30/2019   Lab Results  Component Value Date   CREATININE 1.83 (H) 03/20/2020   BUN 28 (H) 03/20/2020   NA 136 03/20/2020   K 4.8 03/20/2020   CL 98 03/20/2020   CO2 19 (L) 03/20/2020   CBC Latest Ref Rng & Units 03/20/2020 11/29/2019 10/30/2019  WBC 3.4 - 10.8 x10E3/uL 11.3(H) 16.1(H) 13.8(H)  Hemoglobin 13.0 - 17.7 g/dL 13.9 13.5 14.0  Hematocrit 37.5 - 51.0 % 43.0 41.0 42.4  Platelets 150 - 450 x10E3/uL 369 289 299    Lab Results  Component Value Date   TSH 17.300 (H) 03/08/2020    ==================================================  COVID-19 Education: The signs and symptoms of COVID-19 were discussed with the patient and how to seek care for testing (follow up with PCP or arrange E-visit).   The importance of social distancing and COVID-19 vaccination was discussed today. The patient is practicing social distancing & Masking.   I spent a total of 66minutes with the patient  spent in direct patient consultation.  Additional time spent with chart review  / charting (studies, outside notes, etc): 73min Total Time: 65min   Current medicines are reviewed at length with the patient today.  (+/- concerns) n/a  This visit occurred during the SARS-CoV-2 public health emergency.  Safety protocols were in place, including screening questions prior to the visit, additional usage of staff PPE, and extensive cleaning of exam room while observing appropriate contact time as indicated for disinfecting solutions.  Notice: This dictation was prepared with Dragon dictation along with smaller phrase technology. Any transcriptional errors that result from this process are unintentional and may not be corrected upon review.  Patient Instructions / Medication Changes & Studies & Tests Ordered   Patient Instructions  Medication Instructions:   Do not take your blood pressure medication today - spironolactone , Entresto   Stop taking Metoprolol 25 mg   Decrease  Amiodarone to 200 mg ( one tablet) daily    *If you need a refill on your cardiac medications before your next appointment, please call your pharmacy*   Lab Work: Not needed   Testing/Procedures:  Not needed  Follow-Up: At  [facility], you and your health needs are our priority.  As part of our continuing mission to provide you with exceptional heart care, we have created designated Provider Care Teams.  These Care Teams include your primary Cardiologist (physician) and Advanced Practice Providers (APPs -  Physician Assistants and Nurse Practitioners) who all work together to provide you with the care you need, when you need it.     Your next appointment:   3 to 4  month(s)  The format for your next appointment:   In Person  Provider:   Glenetta Hew, MD   Other Instructions  You have been referred to Dr Caryl Comes to discuss Bi- ventricular  ICD    Studies Ordered:   Orders Placed This Encounter   Procedures  . EKG 12-Lead     Glenetta Hew, M.D., M.S. Interventional Cardiologist   Pager # [phone] Phone # [phone] [place], [place] [address]   Thank you for choosing Heart[facility] at [facility]!!

## 2020-04-16 DIAGNOSIS — N400 Enlarged prostate without lower urinary tract symptoms: Secondary | ICD-10-CM | POA: Diagnosis not present

## 2020-04-18 ENCOUNTER — Other Ambulatory Visit: Payer: Self-pay | Admitting: Family Medicine

## 2020-04-18 DIAGNOSIS — E039 Hypothyroidism, unspecified: Secondary | ICD-10-CM

## 2020-04-19 NOTE — Telephone Encounter (Signed)
Has an appt coming up and should have enough until 4/20

## 2020-04-19 NOTE — Progress Notes (Incomplete)
Primary Care Provider: Girtha Rm, NP-C Cardiologist: Glenetta Hew, MD Electrophysiologist: Virl Axe, MD  Clinic Note: No chief complaint on file.   ===================================  ASSESSMENT/PLAN   Problem List Items Addressed This Visit    VT (ventricular tachycardia) ([place]) - Primary   Relevant Medications   amiodarone (PACERONE) 200 MG tablet   Other Relevant Orders   EKG 12-Lead (Completed)   Coronary artery disease involving native coronary artery without angina pectoris (Chronic)   Relevant Medications   amiodarone (PACERONE) 200 MG tablet   Other Relevant Orders   EKG 12-Lead (Completed)   S/P implantation of automatic cardioverter/defibrillator (AICD) (Chronic)   Relevant Orders   EKG 12-Lead (Completed)   On amiodarone therapy (Chronic)   Relevant Orders   EKG 12-Lead (Completed)      ===================================  HPI:    Coury Grieger III is a 75 y.o. male with a complicated cardiac history noted who presents today for 58-month follow-up.  CARDIAC HISTORY CAD   July 2017-History large inferior MI complicated by VT: CAD with PCI, to the RCA and PTCA to RPDA; initial EF 45 to 50%. ? 8//2017: Admitted for Sustained VT / ISCHEMIC CARDIOMYOPATHY,   Cath - Patent RCA stents. High LVEDP (24 mmHg, EF ~35-40%). Ost LAD ~50-60%. -- Noted LBBB  > EPS with PMVT--Saint Jude ICD (followed by Dr. Caryl Comes); he has underlying left bundle branch block --> ? NOT CRTD at that time because EF was 45 to 50%.  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 ? 05/2016 -- R&LHC; patent RCA stents, High LVEDP with Mild Pulm V HTN, CO/CI 3.89 /1.5LOW) ? Echo 05/2016 -> EF of 20% to 25%, Gr II DD. Severe diffuse HK - disproportionately severe hypokinesis and thinning of  the apex anterior septum anterior wall and septal dyssynchrony with EPAP of 53 mmHg.  Referred to Adv CHF = Dr. Haroldine Laws  Echo January 2019-EF 15 to 20%.  Severely reduced EF.  GRII DD.  Moderate MR.  Mild to moderate the elevated PA P.  02/23/2018-Hospitalized for A-on-C HFrEF - BIV FAILURE complicated by A-on-CRF ? IV Diuresis - converted to Torsemide ? 2/2 RF: Dig, Mearl Latin held.  No BB 2/2 Low CO.  ? Discharge weight 265.9 (120.9 kg)  Was not seen again until November 2020 by Tommye Standard, PA (EP) -> ambulating at home with a cane.  Mostly orthopedic issues.  Unable to stand with assistance.  (Was still drinking 5-6 drinks of scotch a night): Was on Entresto, beta-blocker and furosemide as well as Aldactone but only taking it 3 days a week.  Also was theoretically on digoxin. ->  Furosemide converted to torsemide 80 mg twice daily and spironolactone increased to 25 mg.  Told to reduce alcohol.  January 19, 2019-Dr. Caryl Comes: Noted that he is feeling better mild dyspnea, but quite active.  "No peripheral edema "--lost 20 to 25 pounds..   Decided to defer conversion to CRT-D until Covid concerns are over. ->  Recommend using resting elliptical trainer.  Dev Dhondt III was last seen on September 26, 2019-doing well.  Feeling great.  Was recovering from his hospitalization and felt very good.  Starting exercise.  Recent Hospitalizations: None  On November 30, 2019, had his wife contact the office indicating that he had been having worsening congestion and coughing.  Some sinus congestion gained 14 pounds since October 11.  He had been drinking more scotch whiskey which may be part of the reason. ->  He then contributed to eating and snacking for watching football in the [place].  Was not noting any significant dyspnea.  Usually only occasionally at night.  Also notes orthopnea.  Has not noted any significant edema. => Recommendation was to increase torsemide for a few days to see if  we can reduce his weight.  We then want to recheck chemistry panel. => On November 15 call back and noted his weight was back down to 254 pounds.  He had cut scotch out completely.  He was dealing with a cold, that he "could not shake ".  Sleeps in the chair for comfort.  Noted some swelling. => Recommended intermittent increased doses of furosemide every couple days to increase weight loss.  Reviewed  CV studies:    The following studies were reviewed today: (if available, images/films reviewed: From Epic Chart or Care Everywhere) . No new studies:   Interval History:   Finnley Larusso III is here today with his wife.  He is definitely feeling down.  Seems pretty depressed.  He is still quite short of breath doing this but any routine activities.  He does his ADLs but just slower.  He is having issues with increased prostate which makes it difficult to fully void.  He is also being seen by nephrology  He does have some orthopnea sleeping in a chair and also has some PND.  When he sleeps in the bed he sleeps on one pillow but had to sit up soon.  He wakes at least 1 or 2 times a night to urinate and is short of breath when he walks to the bathroom.  He feels "wobbly and unsteady "when he gets up.  It is one of the reasons why he sleeps in a chair because it is easier to get up.  He has not had any syncope.  He does get off-and-on nausea.  He is not sure how much of his lack of activity is is related to depression and how much is due to shortness of breath.  His thyroid dose has been recently increased.  He tells me despite all this shortness of breath and weight change, he is really only taking 1 torsemide tablet a day and a second half tablet of spironolactone.  CV Review of Symptoms (Summary): positive for - dyspnea on exertion, edema, orthopnea, palpitations, paroxysmal nocturnal dyspnea, shortness of breath and Lightheadedness and dizziness, but no near syncope. negative for - chest pain,  loss of consciousness, rapid heart rate or TIA/amaurosis fugax, claudication  The patient does not have symptoms concerning for COVID-19 infection (fever, chills, cough, or new shortness of breath).   REVIEWED OF SYSTEMS   Review of Systems  Constitutional: Positive for malaise/fatigue (Not sure how much of this due to fatigue with hypothyroidism, but could also be somewhat related to anhedonia.) and weight loss (Weight is down since increasing diuretic dosing.  Also not eating as well.).  HENT: Negative for congestion and nosebleeds.   Respiratory: Positive for cough. Negative for sputum production. Shortness of breath: With exertion.   Gastrointestinal: Negative for blood in stool and melena.       Poor appetite  Genitourinary: Positive for frequency (But does not completely void). Negative for hematuria.  Musculoskeletal: Positive for joint pain. Negative for falls.  Neurological: Positive for dizziness and weakness (Generalized wobbly, legs  weak). Negative for headaches.  Psychiatric/Behavioral: Positive for depression and memory loss. The patient is not nervous/anxious and does not have insomnia (Not really insomnia, just difficulty falling back asleep when he has to get up to urinate at night).        He indicates that he has not been drinking as much scotch now.   I have reviewed and (if needed) personally updated the patient's problem list, medications, allergies, past medical and surgical history, social and family history.   PAST MEDICAL HISTORY   Past Medical History:  Diagnosis Date  . AICD (automatic cardioverter/defibrillator) present 08/26/2015   STJ  . Anxiety   . Atherosclerotic heart disease of native coronary artery with angina pectoris ([place]) 07/2015   a. 07/2015 Posterior STEMI/PCI: LM nl, LAd 40ost, RI 40, RCA 14m (3.0x22 Resolute Integrity DES distal, 3.0x12 Resolute Integrity DES prox), 90d (PTCA), RPDA small, nl, RPLB2 100 - too small for PTCA. -- patent in f/u  cath 08/2015 & 05/2016 - patent RCA stents (~10%), RI 40%, LM ~30% ost LAD ~40%.  . Chronic combined systolic and diastolic CHF (congestive heart failure) ([place]) 07/2015   a. 07/2015 [facility]: EF 45-50%, basal-mid inf and infsept AK, inflat, apical inf, and apical septal HK, Gr1 DD, triv MR.;; b - Echo 05/2016 -> EF 20-25%, Gr 2 DD.  Severe diffuse HK disproportionately severe HK and thinning of apex, anterior septum and anterior wall-with septal dyssynchrony.  PAP ~53 mmHg.  [name] Hyperlipidemia with target low density lipoprotein (LDL) cholesterol less than 70 mg/dL   . Hypertensive heart disease   . Ischemic cardiomyopathy 07/2015 - 05/2016   a. 07/2015 Echo: EF 45-50%.; Follow-up Echo 08/23/2015: EF 50-55% with mild inferior hypokinesis --> 05/2016: EF 20-25%, diffuse HK. PAP ~53 mmHg.  . Kidney disease, chronic, stage III (GFR 30-59 ml/min) ([place]) 08/12/2015  . Morbid obesity ([place])   . ST elevation myocardial infarction (STEMI) of inferior wall ([place]) 08/12/2015   Subacute presentation for inferior MI with post infarction angina on the following day. EKG still shows injury current. Therefore decided to proceed with intervention of 100% mRCA thrombotic lesion.  . Sustained ventricular fibrillation ([place]) 08/22/2015   Underwent EP evaluation with final ICD implantation.    PAST SURGICAL HISTORY   Past Surgical History:  Procedure Laterality Date  . CARDIAC CATHETERIZATION N/A 08/12/2015   Procedure: Left Heart Cath and Coronary Angiography;  Surgeon: Leonie Man, MD;  Location: [facility];  Service: Cardiovascular: Inferior STEMI: 100% mid RCA, 90% distal RCA, 100% RPL. 40% ostial and proximal LAD and branch of ramus.  [name] CARDIAC CATHETERIZATION N/A 08/26/2015   Procedure: Left Heart Cath and Coronary Angiography;  Surgeon: Belva Crome, MD;  Location: [facility];  Service: Cardiovascular: To evaluate sustained VT. Widely patent RCA stent and distal PTCA site. Also patent RPL branch. LAD lesion  appeared to be more consistent with 50-55% and 40%. Otherwise stable from previous cath. EF estimated 35-45%.  [name] CARDIOVERSION N/A 08/23/2015   Procedure: CARDIOVERSION;  Surgeon: Deboraha Sprang, MD;  Location: [facility];  Service: Cardiovascular;  Laterality: N/A;  . CORONARY ANGIOPLASTY WITH STENT PLACEMENT  08/12/2015   Mid RCA 100% reduced to 0% with 2 overlapping Resolute DES.  3.0 x 22 mm with a 3.0 x 12 mm prox overlarp (postdilated to 3.4 mm); PTCA of dRCA 90%. (Very dificult,complex case - tortuous, [place] RCA - unable to advance longer stents.  RPL2 noted to have thromboembolic 737% occlusion -  unable to reach.  . ELECTROPHYSIOLOGIC STUDY N/A 08/26/2015   Procedure: Electrophysiology Study;  Surgeon: Deboraha Sprang, MD;  Location: [facility];  Service: Cardiovascular;  Laterality: N/A;  . EP IMPLANTABLE DEVICE N/A 08/26/2015   Procedure: ICD Implant;  Surgeon: Deboraha Sprang, MD;  Location: [facility];  Service: Cardiovascular;  Laterality: LEFT:  St Jude ICD, serial number [id number].   [name] HEMORRHOID SURGERY    . LEA DOPPLERS  06/2019   Lower Extremity Arterial Doppler: Right-mild lower extremity arterial disease with abnormal TBI.  Moderate Left PAD-abnormal TBI.  [name] Lower Extr V Dopplers  04/2019   Lower Venous Reflux Study.:  No evidence of obvious DVT bilaterally.  Reflux noted in Right CFV, GSV in the thigh as well as calf, as well as SSV.  Venous reflux noted in the Left GSV in the thigh, but no evidence in SSV or CFV.  [name] RIGHT/LEFT HEART CATH AND CORONARY ANGIOGRAPHY N/A 06/10/2016   Procedure: Right/Left Heart Cath and Coronary Angiography;  Surgeon: Martinique, Peter M, MD;  Location: [facility] IN[facility]ASIVE [facility]:  Nonobstructive CAD. RCA stents PATENT.  Ost-Prox RCA, 10 %. Ost LM 30 %. Ost LAD 40 %. RI, 40 %; High PCWP  & LVEDP (23 mmHg) ; PAP 43/23/31 mmHg. CO/CI 3.89 / 1/5 (Severely Reduced)  . SKIN SURGERY    . TRANSTHORACIC ECHOCARDIOGRAM  08/13/2015; 08/23/2015   a. Mild  concentric LVH. EF 45-50%. Basal-mid inferior and inferoseptal akinesis with hypokinesis of inferolateral and apical inferior wall. 1 DD. ;; b. Technically difficult study. Definity contrast administered.  Compared to a prior echo in 07/2015, the LVEF is slightly higher at 50-55% with mild inferior hypokinesis.  . TRANSTHORACIC ECHOCARDIOGRAM  06/09/2016   EF  20-25%, GR 2 DD. Severe diffuse  hypokinesis with distinct regional wall motion abnormalities.   There is disproportionately severe hypokinesis and thinning of the apex, anterior septum and anterior wall (septal dyssynchrony). However, there is severe dyssynchrony , making it difficult to assess regional function.  PA P ~53 mmHg  . TRANSTHORACIC ECHOCARDIOGRAM  01/2017   Severely reduced EF.  GI to DD.  Moderate MR.  Mild to have a moderately elevated PAP (peak 56mmHg)  . TRANSTHORACIC ECHOCARDIOGRAM  11/2018    EF 15-20%.  Mild dilation.  Global HK.  Moderate RV enlargement with mildly reduced function.  Severe biatrial enlargement.  Moderate pleural effusion.  Moderate MAP:.  Moderate aortic sclerosis.  Mild ascending aortic dilation-41 mm.  Increased CVP estimated 15 mmHg.   05/31/2016    Immunization History  Administered Date(s) Administered  . Fluad Quad(high Dose 65+) 09/16/2018, 10/30/2019  . Influenza, High Dose Seasonal PF 10/14/2015, 10/06/2016, 10/20/2017  . Influenza-Unspecified 10/20/2014  . PFIZER(Purple Top)SARS-COV-2 Vaccination 03/15/2019, 04/13/2019, 10/30/2019  . Pneumococcal Conjugate-13 10/06/2016  . Pneumococcal Polysaccharide-23 07/21/2012, 08/13/2015  . Td 05/20/2007, 08/26/2010  . Tdap 02/12/2015  . Zoster 07/21/2012  . Zoster Recombinat (Shingrix) 06/22/2018, 09/16/2018    MEDICATIONS/ALLERGIES   Current Meds  Medication Sig  . acetaminophen (TYLENOL) 325 MG tablet Take 2 tablets (650 mg total) by mouth every 4 (four) hours as needed for headache or mild pain.  [name] amiodarone (PACERONE) 200 MG tablet Take 1  tablet (200 mg total) by mouth daily.  [name] atorvastatin (LIPITOR) 80 MG tablet Take 1 tablet (80 mg total) by mouth daily. TAKE 1 TABLET BY MOUTH DAILY AT 6:00 PM.  . Cholecalciferol (VITAMIN D) 50 MCG (2000 UT) CAPS Take 0.0005 capsules (1 Units  total) by mouth daily.  . Coenzyme Q10 (COQ10) 200 MG CAPS Take 200 mg by mouth daily.  . digoxin (LANOXIN) 0.125 MG tablet Take 1 tablet (125 mcg total) by mouth daily.  [name] ENTRESTO 24-26 MG TAKE 1 TABLET BY MOUTH TWICE DAILY  . ezetimibe (ZETIA) 10 MG tablet Take 1 tablet (10 mg total) by mouth daily.  [name] levothyroxine (SYNTHROID) 112 MCG tablet Take 1 tablet (112 mcg total) by mouth daily.  . Multiple Vitamins-Minerals (MULTIVITAMIN ADULT EXTRA C PO) TAKE 1 TABLET BY MOUTH DAILY  . spironolactone (ALDACTONE) 25 MG tablet TAKE 1 TABLET(25 MG) BY MOUTH DAILY  . ticagrelor (BRILINTA) 60 MG TABS tablet Take 1 tablet (60 mg total) by mouth 2 (two) times daily.  [name] torsemide (DEMADEX) 20 MG tablet Take 1 tablet (20 mg total) by mouth daily.  [name] venlafaxine (EFFEXOR) 75 MG tablet TAKE 1 TABLET(75 MG) BY MOUTH DAILY  . [DISCONTINUED] amiodarone (PACERONE) 200 MG tablet Take 1.5 tablets (300 mg total) by mouth daily.    Allergies  Allergen Reactions  . Clams [Shellfish Allergy] Shortness Of Breath, Itching and Swelling  . Shellfish Allergy Anaphylaxis  . Shellfish-Derived Products Shortness Of Breath, Itching and Swelling  . Sulfa Antibiotics Other (See Comments)    unknown  . Other Itching and Other (See Comments)    Ragweed causes watery eyes, itching, sneezing  . Sulfa Antibiotics Other (See Comments)    Pt unsure of reaction, mother told him he was allergic     SOCIAL HISTORY/FAMILY HISTORY   Reviewed in Epic:  Pertinent findings:  Social History   Tobacco Use  . Smoking status: Never Smoker  . Smokeless tobacco: Never Used  Vaping Use  . Vaping Use: Never used  Substance Use Topics  . Alcohol use: Not Currently    Alcohol/week: 3.0 - 4.0  standard drinks    Types: 3 - 4 Shots of liquor per week    Comment: Up until 05-04-2019 spring-3-4 glasses of scotch 3-4 days per week  . Drug use: No   Social History   Social History Narrative   He tells me that his daughter died on Halloween 2015/05/04. She had a diving accident at the age of 70 and been quadriplegic for 25 years. She had been incredibly vigorous girl. He showed me her high school graduation picture.She was as he reported beautiful.  Her name was [name].      He still lives with his wife, [name].  He does have a living will and [name] is his Ambulance person.    OBJCTIVE -PE, EKG, labs   Wt Readings from Last 3 Encounters:  03/25/20 243 lb (110.2 kg)  03/20/20 246 lb 9.6 oz (111.9 kg)  10/30/19 249 lb 3.2 oz (113 kg)    Physical Exam: BP (!) 90/57   Pulse 71   Ht 6\' 1"  (1.854 m)   Wt 243 lb (110.2 kg)   SpO2 98%   BMI 32.06 kg/m  Physical Exam Vitals reviewed.  Constitutional:      General: He is not in acute distress.    Appearance: He is obese. He is ill-appearing (He does not seem well today.  Seems a little bit ill but not acutely). He is not toxic-appearing.     Comments: Seems somewhat unkempt, not well-groomed.  Seems very down  HENT:     Head: Normocephalic and atraumatic.  Neck:     Vascular: Hepatojugular reflux and JVD (Somewhat elevated JVP of roughly 7 to 8  cm H2O) present. No carotid bruit.  Cardiovascular:     Rate and Rhythm: Normal rate and regular rhythm. Occasional extrasystoles are present.    Chest Wall: PMI is displaced (And sustained).     Pulses: Decreased pulses (Difficult to palpate due to thick skin and pedal edema.).     Heart sounds: S2 normal. Heart sounds are distant. Murmur heard.  High-pitched harsh crescendo-decrescendo midsystolic murmur is present with a grade of 2/6 at the upper right sternal border radiating to the neck. Gallop present. S4 (S4 versus prominently split S2) sounds present.      Comments: Normal S1 with  split S2 Pulmonary:     Effort: Pulmonary effort is normal. No respiratory distress.     Comments: Diffuse mild interstitial lung sounds. Chest:     Chest wall: No tenderness.  Musculoskeletal:        General: Swelling (Bilateral legs at least 1+ with pitting edema) present.     Cervical back: Normal range of motion and neck supple.  Skin:    General: Skin is warm and dry.     Comments: Venostasis changes with diffuse brawny discoloration.  Neurological:     General: No focal deficit present.     Mental Status: He is alert and oriented to person, place, and time.     Motor: Weakness (Legs seem weak) present.     Gait: Gait abnormal (Very slow unsteady; uses rolling walker.).  Psychiatric:        Thought Content: Thought content normal.        Judgment: Judgment normal.     Comments: Depressed mood.  Somewhat subdued.  Not very vocal.       Adult ECG Report  Rate: 71 ;  Rhythm: normal sinus rhythm and LBBB.  Otherwise normal axis, intervals and durations.;   Narrative Interpretation: Stable  Recent Labs: Reviewed due to have lab recheck in a couple months. Lab Results  Component Value Date   CHOL 141 10/30/2019   HDL 42 10/30/2019   LDLCALC 76 10/30/2019   TRIG 129 10/30/2019   CHOLHDL 3.4 10/30/2019   Lab Results  Component Value Date   CREATININE 1.83 (H) 03/20/2020   BUN 28 (H) 03/20/2020   NA 136 03/20/2020   K 4.8 03/20/2020   CL 98 03/20/2020   CO2 19 (L) 03/20/2020   CBC Latest Ref Rng & Units 03/20/2020 11/29/2019 10/30/2019  WBC 3.4 - 10.8 x10E3/uL 11.3(H) 16.1(H) 13.8(H)  Hemoglobin 13.0 - 17.7 g/dL 13.9 13.5 14.0  Hematocrit 37.5 - 51.0 % 43.0 41.0 42.4  Platelets 150 - 450 x10E3/uL 369 289 299    Lab Results  Component Value Date   TSH 17.300 (H) 03/08/2020    ==================================================  COVID-19 Education: The signs and symptoms of COVID-19 were discussed with the patient and how to seek care for testing (follow up with PCP  or arrange E-visit).   The importance of social distancing and COVID-19 vaccination was discussed today. The patient is practicing social distancing & Masking.   I spent a total of 59minutes with the patient spent in direct patient consultation.  Additional time spent with chart review  / charting (studies, outside notes, etc): 20 min Total Time: 56 min   Current medicines are reviewed at length with the patient today.  (+/- concerns) n/a  This visit occurred during the SARS-CoV-2 public health emergency.  Safety protocols were in place, including screening questions prior to the visit, additional usage of staff PPE, and extensive  cleaning of exam room while observing appropriate contact time as indicated for disinfecting solutions.  Notice: This dictation was prepared with Dragon dictation along with smaller phrase technology. Any transcriptional errors that result from this process are unintentional and may not be corrected upon review.  Patient Instructions / Medication Changes & Studies & Tests Ordered   Patient Instructions  Medication Instructions:   Do not take your blood pressure medication today - spironolactone , Entresto   Stop taking Metoprolol 25 mg   Decrease  Amiodarone to 200 mg ( one tablet) daily    *If you need a refill on your cardiac medications before your next appointment, please call your pharmacy*   Lab Work: Not needed   Testing/Procedures:  Not needed  Follow-Up: At [facility] you and your health needs are our priority.  As part of our continuing mission to provide you with exceptional heart care, we have created designated Provider Care Teams.  These Care Teams include your primary Cardiologist (physician) and Advanced Practice Providers (APPs -  Physician Assistants and Nurse Practitioners) who all work together to provide you with the care you need, when you need it.     Your next appointment:   3 to 4  month(s)  The format for your  next appointment:   In Person  Provider:   Glenetta Hew, MD   Other Instructions  You have been referred to Dr Caryl Comes to discuss Bi- ventricular  ICD    Studies Ordered:   Orders Placed This Encounter  Procedures  . EKG 12-Lead     Glenetta Hew, M.D., M.S. Interventional Cardiologist   Pager # [phone] Phone # [phone] [address] [place], [place] [address]   Thank you for choosing Heartcare at [facility]!!

## 2020-04-20 ENCOUNTER — Encounter: Payer: Self-pay | Admitting: Cardiology

## 2020-04-20 NOTE — Assessment & Plan Note (Signed)
Longstanding IVCD now more like i LBBB.  I suspect this is playing a role of his reduced EF.   Referral back to EP to consider CRT-D

## 2020-04-20 NOTE — Assessment & Plan Note (Signed)
Baseline NYHA class II CHF symptoms with intermittently class III.  Leonard [name] limited because of his low blood pressure.  He has been on low-dose Entresto very low-dose Toprol along with standing dose torsemide and spironolactone.  He had a setback in November with weight gain that has now been reduced back to his previous baseline.  He does have signs of still having volume overload with some JVD and mild edema.  His renal function does show a slight upturn which may be what is required to keep him adequately diuresed.  On distal be concerned we may have overshot with his pressures.  Plan: We will DC Toprol because of fatigue and low blood pressures.  He is already on amiodarone which gives beta-blocker effect. => Reduced dose of amiodarone to 200 mg daily. -> Hold parameters for Entresto and spironolactone will be for systolic pressure less than 100 mmHg.  Continue Entresto, but have him hold his medication today because of low blood pressure.    Similarly, he will hold his spironolactone today and restart.  For now he is on torsemide 20 mg daily and seems to be at reasonable volume level for now based on his creatinine going up.  Continue to monitor daily weights closely.  Adjust accordingly.  Continue digoxin, but need to be careful with potentially worsening renal function.  Appropriately treat hypothyroidism.  I am going to have him referred back to Dr. Caryl Comes to discuss the possibility of CRT-D or CRT-P.  This was given be delayed due to Covid but now that Covid issues are settling down, I think we need to address this issue.  He is starting to show signs of progressive failure.  I wonder if some of his reduced EF could be related to LBBB and RV lead pacing.

## 2020-04-20 NOTE — Assessment & Plan Note (Signed)
History of pretty extensive inferior STEMI with DES PCI with 2 overlapping stents in the RCA with PTCA of the PDA.  Has had to follow cath with patent stents.  Plan:   Continue maintenance ticagrelor g (60 mg twice daily) iven the extent of stent. => Okay to hold ticagrelor/Brilinta 5 days preop for surgeries or procedures.  He is on high-dose statin which we will continue.  Also on co-Q10 and Zetia.  On low-dose beta-blocker plus amiodarone.  With him being fatigued and hypotensive, I will have him simply stop Toprol.-  He is still on amiodarone which will reduce down to 200 mg daily.  (For A. fib suppression)  He is on Entresto low-dose, but has borderline pressures.

## 2020-04-20 NOTE — Assessment & Plan Note (Signed)
Low blood pressure today.  We are discontinuing Toprol to avoid pacing.  We will also have him hold Entresto and spironolactone.  We have set hold parameters for BP meds.

## 2020-04-20 NOTE — Assessment & Plan Note (Signed)
For now he is on amiodarone for suppression. ICD in place.  Referral for CRT-D

## 2020-04-20 NOTE — Assessment & Plan Note (Signed)
Distant history of MI 4-1/2 years out.  Post MI EF was only 40 to 45% that improved to 50-55%.  Despite having patent stents on follow-up cath, his EF has dropped progressively.  Concerned that LBBB is playing a role.

## 2020-04-20 NOTE — Assessment & Plan Note (Signed)
Most recent labs from October showed LDL of 76.  This is not quite we wanted to be, but right now we are dealing with issues of volume overload He is already on atorvastatin 80 mg plus  Zetia and co-Q10.  I think PCSK9 inhibitor may be a reasonable option to be discussed once we have the issue of CRT-D addressed.[name]

## 2020-04-20 NOTE — Assessment & Plan Note (Signed)
Referring back to EP to discuss CRT-D

## 2020-04-20 NOTE — Assessment & Plan Note (Signed)
I am little bit concerned that the have not adjusted his thyroid replacement dosing.-Increase Synthroid would suggest hypothyroid effect from amiodarone.    Plan: Reduce to 200 mg daily amiodarone.

## 2020-04-20 NOTE — Assessment & Plan Note (Signed)
Not an issue today.  In fact he is hypotensive.  We will stop his Toprol.  Hold antihypertensives for blood pressure less than 100 mmHg.  Would like to get him back on Entresto and spironolactone since possible.

## 2020-04-20 NOTE — Assessment & Plan Note (Signed)
Interestingly, he has ischemic cardiomyopathy by reduced EF following MI but his EF was improved after PCI to 50 to 55%.  Subsequently EF levels have shown dropping all the way down to 15-20% which in no short part is related to LBBB/IVCD.  There is no other indication as to why is this would be.  He has had 2 follow-up cath showing patent stents.  Reduced EF is way out of portion to the extent of his MI.  Referred back to EP to discuss CRT-D

## 2020-04-20 NOTE — Assessment & Plan Note (Signed)
This is an intermittent issue with him.  He seems to be doing fine right now but does have setbacks.  I think his depression takes effect and makes him drink.  Unfortunately, this has put him in a tailspin on occasion.

## 2020-04-26 ENCOUNTER — Other Ambulatory Visit: Payer: Self-pay | Admitting: Cardiology

## 2020-04-26 ENCOUNTER — Ambulatory Visit (INDEPENDENT_AMBULATORY_CARE_PROVIDER_SITE_OTHER): Payer: Medicare Other

## 2020-04-26 DIAGNOSIS — I255 Ischemic cardiomyopathy: Secondary | ICD-10-CM | POA: Diagnosis not present

## 2020-04-26 LAB — CUP PACEART REMOTE DEVICE CHECK
Battery Remaining Longevity: 63 mo
Battery Remaining Percentage: 60 %
Battery Voltage: 2.95 V
Brady Statistic RV Percent Paced: 1 %
Date Time Interrogation Session: 20220408020018
HighPow Impedance: 64 Ohm
HighPow Impedance: 64 Ohm
Implantable Lead Implant Date: 20170807
Implantable Lead Location: 753860
Implantable Pulse Generator Implant Date: 20170807
Lead Channel Impedance Value: 390 Ohm
Lead Channel Pacing Threshold Amplitude: 0.75 V
Lead Channel Pacing Threshold Pulse Width: 0.5 ms
Lead Channel Sensing Intrinsic Amplitude: 11.6 mV
Lead Channel Setting Pacing Amplitude: 2.5 V
Lead Channel Setting Pacing Pulse Width: 0.5 ms
Lead Channel Setting Sensing Sensitivity: 0.5 mV
Pulse Gen Serial Number: 7377761

## 2020-04-29 ENCOUNTER — Encounter: Payer: Self-pay | Admitting: Family Medicine

## 2020-04-29 ENCOUNTER — Ambulatory Visit (INDEPENDENT_AMBULATORY_CARE_PROVIDER_SITE_OTHER): Payer: Medicare Other | Admitting: Family Medicine

## 2020-04-29 VITALS — BP 122/72 | HR 78 | Wt 241.4 lb

## 2020-04-29 DIAGNOSIS — Z79899 Other long term (current) drug therapy: Secondary | ICD-10-CM

## 2020-04-29 DIAGNOSIS — F32A Depression, unspecified: Secondary | ICD-10-CM | POA: Diagnosis not present

## 2020-04-29 DIAGNOSIS — R7309 Other abnormal glucose: Secondary | ICD-10-CM | POA: Diagnosis not present

## 2020-04-29 DIAGNOSIS — E039 Hypothyroidism, unspecified: Secondary | ICD-10-CM

## 2020-04-29 DIAGNOSIS — M21612 Bunion of left foot: Secondary | ICD-10-CM | POA: Diagnosis not present

## 2020-04-29 DIAGNOSIS — N183 Chronic kidney disease, stage 3 unspecified: Secondary | ICD-10-CM

## 2020-04-29 DIAGNOSIS — S90422A Blister (nonthermal), left great toe, initial encounter: Secondary | ICD-10-CM

## 2020-04-29 NOTE — Patient Instructions (Signed)
You will hear from the [facility] to schedule a visit. Ok to do warm soaks of your foot.   I will be in touch with your lab results.

## 2020-04-29 NOTE — Progress Notes (Signed)
   Subjective:    Patient ID: Leonard [name], male    DOB: 1945-06-18, 75 y.o.   MRN: [id number]  HPI Chief Complaint  Patient presents with  . med check    Med check- blister on big toe on left foot, been there for 2 weeks   He is here for a medication management visit.  His wife is with him.  Complains of a blister on his left great toe x 2 weeks. Pain is only when walking barefoot. No pain as long as he is wearing a shoe.  He has not done anything for it.  Hypothyroidism-he is on amiodarone which is interfering with levothyroxine absorption.  We are increasing his dose gradually.  Levothyroxine increased on 03/10/2020. States he has been taking his levothyroxine on empty stomach but then he takes all of his other medications about 5 minutes later.  Depression-states he feels that Effexor is helping.  States he had a down day yesterday thinking about the death of his daughter.  States he usually bounces back fairly quickly.  His wife agrees.  No other concerns today.  Denies fever, chills, dizziness, chest pain, palpitations, shortness of breath, abdominal pain, N/V/D, LE edema.   Reviewed allergies, medications, past medical, surgical, family, and social history.   Review of Systems Pertinent positives and negatives in the history of present illness.     Objective:   Physical Exam BP 122/72   Pulse 78   Wt 241 lb 6.4 oz (109.5 kg)   BMI 31.85 kg/m   Alert and oriented in no acute distress.  Respirations unlabored.  Normal speech, mood and thought process. No LE edema. Dark spot on distal medial aspect of his left great toe consistent with blood blister and thick dry skin overlaying the area. Hallux valgus of left MTP joint      Assessment & Plan:  Hypothyroidism, unspecified type - Plan: TSH, T4, free, levothyroxine (SYNTHROID) 125 MCG tablet  Depression, unspecified depression type  Blister of left great toe, initial encounter - Plan: Ambulatory referral to  Podiatry, CBC with Differential/Platelet  Asymptomatic bunion of left great toe - Plan: Ambulatory referral to Podiatry  On amiodarone therapy  Elevated glucose - Plan: Hemoglobin A1c  Stage 3 chronic kidney disease, unspecified whether stage 3a or 3b CKD ([place]) - Plan: CBC with Differential/Platelet, Comprehensive metabolic panel  He is on amiodarone which is most likely interfering with levothyroxine absorption. We will check his TSH today and increase levothyroxine dose again. Advised to take this on empty stomach 30 minutes prior to other medications.  Mood is stable.  Concerning blood blister on great toe and no sign of infection at the moment. Refer to podiatry.  History of prediabetes and I will check an A1c Monitor CKD and keep blood pressure under good control. He sees his cardiologist for HTN.  Follow up pending labs.

## 2020-04-30 ENCOUNTER — Other Ambulatory Visit: Payer: Self-pay | Admitting: Family Medicine

## 2020-04-30 DIAGNOSIS — E039 Hypothyroidism, unspecified: Secondary | ICD-10-CM

## 2020-04-30 LAB — COMPREHENSIVE METABOLIC PANEL
ALT: 18 IU/L (ref 0–44)
AST: 18 IU/L (ref 0–40)
Albumin/Globulin Ratio: 1.9 (ref 1.2–2.2)
Albumin: 4.4 g/dL (ref 3.7–4.7)
Alkaline Phosphatase: 113 IU/L (ref 44–121)
BUN/Creatinine Ratio: 15 (ref 10–24)
BUN: 28 mg/dL — ABNORMAL HIGH (ref 8–27)
Bilirubin Total: 0.9 mg/dL (ref 0.0–1.2)
CO2: 20 mmol/L (ref 20–29)
Calcium: 9.3 mg/dL (ref 8.6–10.2)
Chloride: 97 mmol/L (ref 96–106)
Creatinine, Ser: 1.87 mg/dL — ABNORMAL HIGH (ref 0.76–1.27)
Globulin, Total: 2.3 g/dL (ref 1.5–4.5)
Glucose: 104 mg/dL — ABNORMAL HIGH (ref 65–99)
Potassium: 4.4 mmol/L (ref 3.5–5.2)
Sodium: 137 mmol/L (ref 134–144)
Total Protein: 6.7 g/dL (ref 6.0–8.5)
eGFR: 37 mL/min/{1.73_m2} — ABNORMAL LOW (ref >59)

## 2020-04-30 LAB — CBC WITH DIFFERENTIAL/PLATELET
Basophils Absolute: 0.1 10*3/uL (ref 0.0–0.2)
Basos: 0 %
EOS (ABSOLUTE): 0.3 10*3/uL (ref 0.0–0.4)
Eos: 2 %
Hematocrit: 43.4 % (ref 37.5–51.0)
Hemoglobin: 13.9 g/dL (ref 13.0–17.7)
Immature Grans (Abs): 0.1 10*3/uL (ref 0.0–0.1)
Immature Granulocytes: 1 %
Lymphocytes Absolute: 1.9 10*3/uL (ref 0.7–3.1)
Lymphs: 13 %
MCH: 29.3 pg (ref 26.6–33.0)
MCHC: 32 g/dL (ref 31.5–35.7)
MCV: 91 fL (ref 79–97)
Monocytes Absolute: 0.7 10*3/uL (ref 0.1–0.9)
Monocytes: 5 %
Neutrophils Absolute: 12 10*3/uL — ABNORMAL HIGH (ref 1.4–7.0)
Neutrophils: 79 %
Platelets: 304 10*3/uL (ref 150–450)
RBC: 4.75 x10E6/uL (ref 4.14–5.80)
RDW: 16.9 % — ABNORMAL HIGH (ref 11.6–15.4)
WBC: 15 10*3/uL — ABNORMAL HIGH (ref 3.4–10.8)

## 2020-04-30 LAB — HEMOGLOBIN A1C
Est. average glucose Bld gHb Est-mCnc: 123 mg/dL
Hgb A1c MFr Bld: 5.9 % — ABNORMAL HIGH (ref 4.8–5.6)

## 2020-04-30 LAB — TSH: TSH: 10.2 u[IU]/mL — ABNORMAL HIGH (ref 0.450–4.500)

## 2020-04-30 LAB — T4, FREE: Free T4: 1.56 ng/dL (ref 0.82–1.77)

## 2020-04-30 MED ORDER — LEVOTHYROXINE SODIUM 125 MCG PO TABS: 125.0000 ug | ORAL_TABLET | Freq: Every day | ORAL | 1 refills | Status: DC

## 2020-05-05 NOTE — Progress Notes (Signed)
Please make sure he started the new dose of levothyroxine. Did he read his message and results?

## 2020-05-06 ENCOUNTER — Ambulatory Visit (INDEPENDENT_AMBULATORY_CARE_PROVIDER_SITE_OTHER): Payer: Medicare Other | Admitting: Podiatry

## 2020-05-06 ENCOUNTER — Other Ambulatory Visit: Payer: Self-pay

## 2020-05-06 ENCOUNTER — Encounter: Payer: Self-pay | Admitting: Podiatry

## 2020-05-06 DIAGNOSIS — M79675 Pain in left toe(s): Secondary | ICD-10-CM | POA: Diagnosis not present

## 2020-05-06 DIAGNOSIS — E1151 Type 2 diabetes mellitus with diabetic peripheral angiopathy without gangrene: Secondary | ICD-10-CM | POA: Diagnosis not present

## 2020-05-06 DIAGNOSIS — M21619 Bunion of unspecified foot: Secondary | ICD-10-CM

## 2020-05-06 DIAGNOSIS — M79674 Pain in right toe(s): Secondary | ICD-10-CM | POA: Diagnosis not present

## 2020-05-06 DIAGNOSIS — L84 Corns and callosities: Secondary | ICD-10-CM | POA: Diagnosis not present

## 2020-05-06 DIAGNOSIS — B351 Tinea unguium: Secondary | ICD-10-CM | POA: Diagnosis not present

## 2020-05-06 DIAGNOSIS — I255 Ischemic cardiomyopathy: Secondary | ICD-10-CM

## 2020-05-06 DIAGNOSIS — D689 Coagulation defect, unspecified: Secondary | ICD-10-CM

## 2020-05-08 NOTE — Progress Notes (Signed)
Subjective:   Patient ID: Leonard [name], male   DOB: 75 y.o.   MRN: [id number]   HPI Patient presents stating he is got a breakdown of tissue on the medial side of the left big toe and has severe foot structural issues and has had this for years.  States that it was concerning to him and he wanted to get it checked and patient does not smoke and is not active.  Patient also has nails that get thick and brittle and he cannot cut and are painful   Review of Systems  All other systems reviewed and are negative.       Objective:  Physical Exam Vitals and nursing note reviewed.  Constitutional:      Appearance: He is well-developed.  Pulmonary:     Effort: Pulmonary effort is normal.  Musculoskeletal:        General: Normal range of motion.  Skin:    General: Skin is warm.  Neurological:     Mental Status: He is alert.     Vascular status found to be moderately diminished both PT DP pulses which are present but weak with patient found to have significant keratotic lesion left hallux medial side painful and no breakdown of tissue currently with thick yellow brittle nailbeds 1-5 both feet significant structural bunion deformity left with rigid contracture of the first MPJ left     Assessment:  Poor health individual with significant at risk conditions with thick keratotic tissue formation and thick yellow brittle nailbeds 1-5 both feet that are painful     Plan:  H&P reviewed condition and daily inspections of his feet along with the fact he is on blood thinner and also has vascular disease.  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

## 2020-05-09 NOTE — Progress Notes (Signed)
Remote ICD transmission.   

## 2020-05-27 ENCOUNTER — Other Ambulatory Visit: Payer: Self-pay | Admitting: Cardiology

## 2020-05-27 DIAGNOSIS — I472 Ventricular tachycardia, unspecified: Secondary | ICD-10-CM

## 2020-05-29 ENCOUNTER — Other Ambulatory Visit: Payer: Self-pay

## 2020-05-29 ENCOUNTER — Encounter: Payer: Self-pay | Admitting: Internal Medicine

## 2020-05-29 ENCOUNTER — Ambulatory Visit (INDEPENDENT_AMBULATORY_CARE_PROVIDER_SITE_OTHER): Payer: Medicare Other | Admitting: Internal Medicine

## 2020-05-29 VITALS — BP 112/72 | HR 81 | Ht 73.0 in | Wt 242.8 lb

## 2020-05-29 DIAGNOSIS — I472 Ventricular tachycardia, unspecified: Secondary | ICD-10-CM

## 2020-05-29 DIAGNOSIS — Z95810 Presence of automatic (implantable) cardiac defibrillator: Secondary | ICD-10-CM | POA: Diagnosis not present

## 2020-05-29 DIAGNOSIS — I447 Left bundle-branch block, unspecified: Secondary | ICD-10-CM | POA: Diagnosis not present

## 2020-05-29 DIAGNOSIS — I255 Ischemic cardiomyopathy: Secondary | ICD-10-CM | POA: Diagnosis not present

## 2020-05-29 NOTE — Patient Instructions (Signed)
Medication Instructions:  Your physician recommends that you continue on your current medications as directed. Please refer to the Current Medication list given to you today.  *If you need a refill on your cardiac medications before your next appointment, please call your pharmacy*   Lab Work: None ordered.  If you have labs (blood work) drawn today and your tests are completely normal, you will receive your results only by: [name] MyChart Message (if you have MyChart) OR . A paper copy in the mail If you have any lab test that is abnormal or we need to change your treatment, we will call you to review the results.   Testing/Procedures: None ordered.    Follow-Up: At [facility], you and your health needs are our priority.  As part of our continuing mission to provide you with exceptional heart care, we have created designated Provider Care Teams.  These Care Teams include your primary Cardiologist (physician) and Advanced Practice Providers (APPs -  Physician Assistants and Nurse Practitioners) who all work together to provide you with the care you need, when you need it.  We recommend signing up for the patient portal called "MyChart".  Sign up information is provided on this After Visit Summary.  MyChart is used to connect with patients for Virtual Visits (Telemedicine).  Patients are able to view lab/test results, encounter notes, upcoming appointments, etc.  Non-urgent messages can be sent to your provider as well.   To learn more about what you can do with MyChart, go to [email or web address].    Your next appointment:   Thursday 07/18/2020 at 1:45pm

## 2020-05-29 NOTE — Progress Notes (Signed)
Patient Care Team: Girtha Rm, NP-C as PCP - General (Family Medicine) Deboraha Sprang, MD as PCP - Electrophysiology (Cardiology) Bensimhon, Shaune Pascal, MD as PCP - Advanced Heart Failure (Cardiology) Leonie Man, MD as PCP - Cardiology (Cardiology) Girtha Rm, NP-C (Family Medicine) Leonie Man, MD (Cardiology) Rexene Agent, MD as Attending Physician (Nephrology)   HPI  Leonard [name] is a 75 y.o. male Seen in follow-up for ventricular tachycardia and ICD implantation for secondary prevention 8/17. He had presented in the context of ischemic heart disease prior PCI with sustained wide complex tachycardia and was cardioverted. EP testing confirmed ventricular tachycardia.  9/17   appropriate therapy for polymorphic ventricular tachycardia with some degree of long short coupling>>  beta blockers were increased.  He reports that he is doing well today. He is accompanied by his wife. His wife notes that he has some fatigue and minimal shortness of breath at times however she is unaware when this occurs.  It is not his impression but it may be hers that his days of feeling not good following days of vigorous activity.    He has no difficulty with sleeping or feeling restless in the morning. Wife notes that he has fallen twice in the last 2 month but thinks it is not related to his heart.  He describes his legs getting weak.  He has blamed it on old baseball and football injuries.  He denies having any chest pain, tightness, or pressure. He denies having any lightheadedness, pre-syncopal, or syncopal episodes.    Date Cr K TSH Hgb Mg  9/17  0.93 4.4   1.5  5/18  1.41 4.9      12/20 1.98      4/22 1.87 4.4 10.2 13.9    DATE TEST EF   8/17  Cath  35-40 % Widely patent right coronary stents,Ostial 50-60% LAD  5/18   Echo  20-25 %   5/18  Cath  Findings unchanged   1/19 Echo 15-20%   11/20 Echo  15-20%     Followed by Dr. Shelby Mattocks.  Referred back for  consideration of CRT.  Discussed previously.  He was disinclined both because of COVID as well as because of respiratory status.   He tells me that his daughter died on Halloween 2017. She had a diving accident at the age of 10 and been quadriplegic for 25 years. She had been incredibly vigorous girl. He showed me her high school graduation picture.She was as he reported beautiful.  Her name was Leonard [name]    Past Medical History:  Diagnosis Date  . AICD (automatic cardioverter/defibrillator) present 08/26/2015   STJ  . Anxiety   . Atherosclerotic heart disease of native coronary artery with angina pectoris ([place]) 07/2015   a. 07/2015 Posterior STEMI/PCI: LM nl, LAd 40ost, RI 40, RCA 19m (3.0x22 Resolute Integrity DES distal, 3.0x12 Resolute Integrity DES prox), 90d (PTCA), RPDA small, nl, RPLB2 100 - too small for PTCA. -- patent in f/u cath 08/2015 & 05/2016 - patent RCA stents (~10%), RI 40%, LM ~30% ost LAD ~40%.  . Chronic combined systolic and diastolic CHF (congestive heart failure) ([place]) 07/2015   a. 07/2015 Ehco: EF 45-50%, basal-mid inf and infsept AK, inflat, apical inf, and apical septal HK, Gr1 DD, triv MR.;; b - Echo 05/2016 -> EF 20-25%, Gr 2 DD.  Severe diffuse HK disproportionately severe HK and thinning of apex, anterior septum and anterior wall-with septal dyssynchrony.  PAP ~53 mmHg.  [name] Hyperlipidemia with target low density lipoprotein (LDL) cholesterol less than 70 mg/dL   . Hypertensive heart disease   . Ischemic cardiomyopathy 07/2015 - 05/2016   a. 07/2015 Echo: EF 45-50%.; Follow-up Echo 08/23/2015: EF 50-55% with mild inferior hypokinesis --> 05/2016: EF 20-25%, diffuse HK. PAP ~53 mmHg.  . Kidney disease, chronic, stage [name] (GFR 30-59 ml/min) ([place]) 08/12/2015  . Morbid obesity ([place])   . ST elevation myocardial infarction (STEMI) of inferior wall ([place]) 08/12/2015   Subacute presentation for inferior MI with post infarction angina on the following day. EKG still shows injury current.  Therefore decided to proceed with intervention of 100% mRCA thrombotic lesion.  . Sustained ventricular fibrillation ([place]) 08/22/2015   Underwent EP evaluation with final ICD implantation.    Past Surgical History:  Procedure Laterality Date  . CARDIAC CATHETERIZATION N/A 08/12/2015   Procedure: Left Heart Cath and Coronary Angiography;  Surgeon: Leonie Man, MD;  Location: [facility];  Service: Cardiovascular: Inferior STEMI: 100% mid RCA, 90% distal RCA, 100% RPL. 40% ostial and proximal LAD and branch of ramus.  [name] CARDIAC CATHETERIZATION N/A 08/26/2015   Procedure: Left Heart Cath and Coronary Angiography;  Surgeon: Belva Crome, MD;  Location: [facility];  Service: Cardiovascular: To evaluate sustained VT. Widely patent RCA stent and distal PTCA site. Also patent RPL branch. LAD lesion appeared to be more consistent with 50-55% and 40%. Otherwise stable from previous cath. EF estimated 35-45%.  [name] CARDIOVERSION N/A 08/23/2015   Procedure: CARDIOVERSION;  Surgeon: Deboraha Sprang, MD;  Location: [facility];  Service: Cardiovascular;  Laterality: N/A;  . CORONARY ANGIOPLASTY WITH STENT PLACEMENT  08/12/2015   Mid RCA 100% reduced to 0% with 2 overlapping Resolute DES.  3.0 x 22 mm with a 3.0 x 12 mm prox overlarp (postdilated to 3.4 mm); PTCA of dRCA 90%. (Very dificult,complex case - tortuous, [place] RCA - unable to advance longer stents.  RPL2 noted to have thromboembolic 409% occlusion - unable to reach.  . ELECTROPHYSIOLOGIC STUDY N/A 08/26/2015   Procedure: Electrophysiology Study;  Surgeon: Deboraha Sprang, MD;  Location: [facility];  Service: Cardiovascular;  Laterality: N/A;  . EP IMPLANTABLE DEVICE N/A 08/26/2015   Procedure: ICD Implant;  Surgeon: Deboraha Sprang, MD;  Location: [facility];  Service: Cardiovascular;  Laterality: LEFT:  St Jude ICD, serial number [id number].   [name] HEMORRHOID SURGERY    . LEA DOPPLERS  06/2019   Lower Extremity Arterial Doppler:  Right-mild lower extremity arterial disease with abnormal TBI.  Moderate Left PAD-abnormal TBI.  [name] Lower Extr V Dopplers  04/2019   Lower Venous Reflux Study.:  No evidence of obvious DVT bilaterally.  Reflux noted in Right CFV, GSV in the thigh as well as calf, as well as SSV.  Venous reflux noted in the Left GSV in the thigh, but no evidence in SSV or CFV.  [name] RIGHT/LEFT HEART CATH AND CORONARY ANGIOGRAPHY N/A 06/10/2016   Procedure: Right/Left Heart Cath and Coronary Angiography;  Surgeon: Martinique, Peter M, MD;  Location: [facility]:  Nonobstructive CAD. RCA stents PATENT.  Ost-Prox RCA, 10 %. Ost LM 30 %. Ost LAD 40 %. RI, 40 %; High PCWP  & LVEDP (23 mmHg) ; PAP 43/23/31 mmHg. CO/CI 3.89 / 1/5 (Severely Reduced)  . SKIN SURGERY    . TRANSTHORACIC ECHOCARDIOGRAM  08/13/2015; 08/23/2015   a. Mild concentric LVH. EF 45-50%. Basal-mid  inferior and inferoseptal akinesis with hypokinesis of inferolateral and apical inferior wall. 1 DD. ;; b. Technically difficult study. Definity contrast administered.  Compared to a prior echo in 07/2015, the LVEF is slightly higher at 50-55% with mild inferior hypokinesis.  . TRANSTHORACIC ECHOCARDIOGRAM  06/09/2016   EF  20-25%, GR 2 DD. Severe diffuse  hypokinesis with distinct regional wall motion abnormalities.   There is disproportionately severe hypokinesis and thinning of the apex, anterior septum and anterior wall (septal dyssynchrony). However, there is severe dyssynchrony , making it difficult to assess regional function.  PA P ~53 mmHg  . TRANSTHORACIC ECHOCARDIOGRAM  01/2017   Severely reduced EF.  GI to DD.  Moderate MR.  Mild to have a moderately elevated PAP (peak 2mmHg)  . TRANSTHORACIC ECHOCARDIOGRAM  11/2018    EF 15-20%.  Mild dilation.  Global HK.  Moderate RV enlargement with mildly reduced function.  Severe biatrial enlargement.  Moderate pleural effusion.  Moderate MAP:.  Moderate aortic sclerosis.  Mild ascending aortic dilation-41 mm.   Increased CVP estimated 15 mmHg.    Current Outpatient Medications  Medication Sig Dispense Refill  . acetaminophen (TYLENOL) 325 MG tablet Take 2 tablets (650 mg total) by mouth every 4 (four) hours as needed for headache or mild pain.    [name] amiodarone (PACERONE) 200 MG tablet Take 1 tablet (200 mg total) by mouth daily. 90 tablet 3  . atorvastatin (LIPITOR) 80 MG tablet Take 1 tablet (80 mg total) by mouth daily. TAKE 1 TABLET BY MOUTH DAILY AT 6:00 PM. 90 tablet 2  . Cholecalciferol (VITAMIN D) 50 MCG (2000 UT) CAPS Take 0.0005 capsules (1 Units total) by mouth daily. 30 capsule 1  . Coenzyme Q10 (COQ10) 200 MG CAPS Take 200 mg by mouth daily.    . digoxin (LANOXIN) 0.125 MG tablet Take 1 tablet (125 mcg total) by mouth daily. 90 tablet 3  . ENTRESTO 24-26 MG TAKE 1 TABLET BY MOUTH TWICE DAILY 60 tablet 3  . levothyroxine (SYNTHROID) 125 MCG tablet TAKE 1 TABLET(125 MCG) BY MOUTH DAILY 90 tablet 0  . Multiple Vitamins-Minerals (MULTIVITAMIN ADULT EXTRA C PO) TAKE 1 TABLET BY MOUTH DAILY    . spironolactone (ALDACTONE) 25 MG tablet TAKE 1 TABLET(25 MG) BY MOUTH DAILY 90 tablet 1  . ticagrelor (BRILINTA) 60 MG TABS tablet Take 1 tablet (60 mg total) by mouth 2 (two) times daily. 60 tablet 11  . torsemide (DEMADEX) 20 MG tablet Take 1 tablet (20 mg total) by mouth daily. 30 tablet 8  . venlafaxine (EFFEXOR) 75 MG tablet TAKE 1 TABLET(75 MG) BY MOUTH DAILY 30 tablet 5  . ezetimibe (ZETIA) 10 MG tablet Take 1 tablet (10 mg total) by mouth daily. 90 tablet 3   No current facility-administered medications for this visit.    Allergies  Allergen Reactions  . Clams [Shellfish Allergy] Shortness Of Breath, Itching and Swelling  . Shellfish Allergy Anaphylaxis  . Shellfish-Derived Products Shortness Of Breath, Itching and Swelling  . Sulfa Antibiotics Other (See Comments)    unknown  . Other Itching and Other (See Comments)    Ragweed causes watery eyes, itching, sneezing  . Sulfa Antibiotics  Other (See Comments)    Pt unsure of reaction, mother told him he was allergic       Review of Systems negative except from HPI and PMH  Physical Exam BP 112/72   Pulse 81   Ht 6\' 1"  (1.854 m)   Wt 242 lb 12.8 oz (110.1  kg)   SpO2 98%   BMI 32.03 kg/m  Well developed and well nourished in no acute distress HENT normal Neck supple   Clear Device pocket well healed; without hematoma or erythema.  There is no tethering  Regular rate and rhythm, no  murmur Abd-soft with active BS No Clubbing cyanosis *trace edema Skin-warm and dry A & Oriented  Grossly normal sensory and motor function walking with a walker    ECG sinus at 81 Interval 28/16/44 Left axis deviation -66 Left bundle branch block with monophasic QRS lead I, aVL and RS in lead V6  ECG sinus at 65 Intervals 27/21/54 Atypical left bundle branch block   ECGs were personally reviewed from 12/20 11/20 & 2/19 all of which show monophasic R wave in at least one of the lateral leads  Assessment and  Plan Ischemic cardiomyopathy  Congestive heart failure-chronic-systolic   Ventricular tachycardia-monomorphic and polymorphic   Implantable defibrillator-St. Jude  LBBB  Sinus tachycardia   Renal insufficiency chronic  Morbid obesity   Ischemic cardiomyopathy.  No symptoms of ongoing ischemia.  Referred for [place] to reconsider CRT.  He feels pretty good.  And as such he is disinclined to undertake a procedure.  In support of his hesitation, his symptoms are relatively minimal, limited largely by other things that his heart failure.  His left axis deviation and first-degree AV block are also attenuating factors vis--vis benefit.  He and his wife will try do assess as to whether his intermittent bad days following too vigorous an activity and as such may be a manifestation of heart failure.  His falls are concerning for orthostatic intolerance especially given his low blood pressure and his amiodarone.  I have  instructed his wife and to measuring her orthostatics and we will plan to call him next week to follow-up on these  Have him take his Aldactone at night to try to mitigate its effect on blood pressure.   I,Alexis Bryant,acting as a scribe for Virl Axe, MD.,have documented all relevant documentation on the behalf of Virl Axe, MD,as directed by  Virl Axe, MD while in the presence of Virl Axe, MD.  I, Virl Axe, MD, have reviewed all documentation for this visit. The documentation on 05/29/20 for the exam, diagnosis, procedures, and orders are all accurate and complete.

## 2020-06-03 ENCOUNTER — Other Ambulatory Visit: Payer: Self-pay | Admitting: Internal Medicine

## 2020-06-21 ENCOUNTER — Telehealth: Payer: Self-pay

## 2020-06-21 NOTE — Telephone Encounter (Signed)
-----   Message from Thora Lance, RN sent at 05/29/2020  6:35 PM EDT ----- Regarding: orthos Call pt next week for ortho results

## 2020-06-21 NOTE — Telephone Encounter (Signed)
Spoke with pt and requested results from orthostatic B/P's.  Pt states he will have to have his wife call back with results as they are not currently at home.  Pt advised Dr Caryl Comes and RN will be out of the office until 07/02/2020.  Pt verbalizes understanding and thanked Therapist, sports for the call.

## 2020-06-23 ENCOUNTER — Other Ambulatory Visit: Payer: Self-pay | Admitting: Cardiology

## 2020-06-24 ENCOUNTER — Other Ambulatory Visit: Payer: Self-pay | Admitting: Cardiology

## 2020-06-25 ENCOUNTER — Other Ambulatory Visit: Payer: Self-pay | Admitting: Cardiology

## 2020-06-25 DIAGNOSIS — I472 Ventricular tachycardia, unspecified: Secondary | ICD-10-CM

## 2020-07-18 ENCOUNTER — Telehealth: Payer: Medicare Other | Admitting: Internal Medicine

## 2020-07-20 ENCOUNTER — Other Ambulatory Visit: Payer: Self-pay | Admitting: Cardiology

## 2020-07-20 ENCOUNTER — Other Ambulatory Visit: Payer: Self-pay | Admitting: Family Medicine

## 2020-07-20 DIAGNOSIS — E039 Hypothyroidism, unspecified: Secondary | ICD-10-CM

## 2020-07-24 ENCOUNTER — Other Ambulatory Visit: Payer: Self-pay | Admitting: Family Medicine

## 2020-07-26 ENCOUNTER — Ambulatory Visit (INDEPENDENT_AMBULATORY_CARE_PROVIDER_SITE_OTHER): Payer: Medicare Other

## 2020-07-26 DIAGNOSIS — I255 Ischemic cardiomyopathy: Secondary | ICD-10-CM

## 2020-07-28 LAB — CUP PACEART REMOTE DEVICE CHECK
Battery Remaining Longevity: 59 mo
Battery Remaining Percentage: 58 %
Battery Voltage: 2.93 V
Brady Statistic RV Percent Paced: 1 %
Date Time Interrogation Session: 20220708020015
HighPow Impedance: 60 Ohm
HighPow Impedance: 60 Ohm
Implantable Lead Implant Date: 20170807
Implantable Lead Location: 753860
Implantable Pulse Generator Implant Date: 20170807
Lead Channel Impedance Value: 380 Ohm
Lead Channel Pacing Threshold Amplitude: 0.75 V
Lead Channel Pacing Threshold Pulse Width: 0.5 ms
Lead Channel Sensing Intrinsic Amplitude: 9.8 mV
Lead Channel Setting Pacing Amplitude: 2.5 V
Lead Channel Setting Pacing Pulse Width: 0.5 ms
Lead Channel Setting Sensing Sensitivity: 0.5 mV
Pulse Gen Serial Number: 7377761

## 2020-08-04 NOTE — Progress Notes (Deleted)
Primary Care Provider: Girtha Rm, NP-C Cardiologist: Glenetta Hew, MD Electrophysiologist: Virl Axe, MD  Clinic Note: No chief complaint on file.  ===================================  ASSESSMENT/PLAN   Problem List Items Addressed This Visit     Coronary artery disease involving native coronary artery without angina pectoris (Chronic)   Essential hypertension (Chronic)   Hyperlipidemia LDL goal <70 (Chronic)   Ischemic cardiomyopathy - Primary (Chronic)   Morbid obesity (HCC) (Chronic)   Chronic combined systolic and diastolic CHF (congestive heart failure) (HCC) (Chronic)   S/P implantation of automatic cardioverter/defibrillator (AICD) (Chronic)   LBBB (left bundle branch block) (Chronic)   On amiodarone therapy (Chronic)    ===================================  HPI:    Leonard [name] is a 75 y.o. male with a complicated complicated cardiac history of CAD and dilated CM/chronic HFpEF (combined ischemic and LBBB related-reviewed below) along with History of Heavy Alcohol Use below who presents today for 22-month follow-up.  Cardiac History: July 2017-large inferior STEMI complicated by VT -> PCI RCA & PTCA rPDA, EF ~45-50% August 2017> Sustained VT (ICM) --> LBBB;  Cath patent RCA stent, EF 35-40% -->  EPS - PMVT --> St. Jude ICD  (no CRT-d despite LBBB b/c EF by Echo 45-50%) Beta-blockers increased in September 2017 for polymorphic VT. ICM / Chronic HFrEF) -> initial EF 45 to 50%. Jun 10, 2016: Admitted for acute combined CHF -> EF down to 20-25%, GRII DD.  Diffuse HK.  Apical anterior anterior thinning/septal dyssynchrony. R&LHC -still with patent stents.  High LVEDP.  Mild Pilm V HTN, CO/CI ~3.9/1.5! -> referred to Adv CHF (Dr. Haroldine Laws) Jan 2019: EF 20-25%, Gr [name] DD. Mod MR. Mild-Mod elevated PAP. 02/2018: Admission for A on C Combined CHF - Bi V CHF - IV diuresis, changed to PO Torsemide; Entresto & Arlyce Harman held 2/2 ARF. (D/c wgt 266 lb) Nov 2020 ->  apparently was still on furosemide-converted to torsemide 80 mg twice daily and spironolactone 25 mg daily.  Told to reduce alcohol. Seen in December 2020 by Dr. Caryl Comes -> was feeling better-had lost 20 to 25 pounds.  Mild dyspnea.  No edema.  Still decided to defer conversion to CRT-D because of COVID concerns; recommended using elliptical trainer.  Leonard [name] was last seen on March 25, 2020.  He was definitely feeling down and depressed.  Feeling quite short of breath with routine activities, and requiring longer time to do ADLs.  Having difficulty voiding with his prostate issues and was referred to nephrology for renal issues.  Sleeping in a chair (as much for ease of standing up) with orthopnea and some PND.  Frequent nocturia, and notes dyspnea walking to the bathroom.  Wobbly unsteady gait.  Lack of activity either related to depression or dyspnea.  Noted that his thyroid level was recently increased. -> Was only taking 1 torsemide tablet along with half tablet spironolactone.  CV Review of Symptoms (Summary): positive for - dyspnea on exertion, edema, orthopnea, palpitations, paroxysmal nocturnal dyspnea, shortness of breath and Lightheadedness and dizziness, but no near syncope. negative for - chest pain, loss of consciousness, rapid heart rate or TIA/amaurosis fugax, claudication  Recent Hospitalizations: None  He was actually just seen by Dr. Caryl Comes on May 11 to reconsider CRT-> was doing well.  Companied by his wife.  Some fatigue and minimal exertional dyspnea.  Sometimes does not feel well after days of vigorous activity.  Has had 2 mechanical falls related to leg weakness.  Blaming it on old sports injuries.  No CHF symptoms, palpitations, syncope/near syncope. -> note suggest that he is disinclined to undertake CRT because he is "feeling good."  There were instructed to monitor for signs of orthostatic hypotension.  Was told to take Aldactone at night?   Reviewed  CV studies:     The following studies were reviewed today: (if available, images/films reviewed: From Epic Chart or Care Everywhere) ***:   Interval History:   Leonard [name]   CV Review of Symptoms (Summary) Cardiovascular ROS: {roscv:310661}  REVIEWED OF SYSTEMS   ROS Malaise/fatigue -> question related to hypothyroidism or more anhedonia.  He did note weight loss since increasing diuretic and decreased p.o. intake. Cough Exertional dyspnea; poor appetite Increased urinary frequency/nocturia-can issues with prostate. Significant joint pains Lots of dizziness and weakness/generalized with wobbly legs. Depression memory loss. Unable to fall back asleep when he gets up to urinate.  I have reviewed and (if needed) personally updated the patient's problem list, medications, allergies, past medical and surgical history, social and family history.   PAST MEDICAL HISTORY   Past Medical History:  Diagnosis Date   AICD (automatic cardioverter/defibrillator) present 08/26/2015   STJ   Anxiety    Atherosclerotic heart disease of native coronary artery with angina pectoris ([place]) 07/2015   a. 07/2015 Posterior STEMI/PCI: LM nl, LAd 40ost, RI 40, RCA 168m (3.0x22 Resolute Integrity DES distal, 3.0x12 Resolute Integrity DES prox), 90d (PTCA), RPDA small, nl, RPLB2 100 - too small for PTCA. -- patent in f/u cath 08/2015 & 05/2016 - patent RCA stents (~10%), RI 40%, LM ~30% ost LAD ~40%.   Chronic combined systolic and diastolic CHF (congestive heart failure) ([place]) 07/2015   a. 07/2015 [facility]: EF 45-50%, basal-mid inf and infsept AK, inflat, apical inf, and apical septal HK, Gr1 DD, triv MR.;; b - Echo 05/2016 -> EF 20-25%, Gr 2 DD.  Severe diffuse HK disproportionately severe HK and thinning of apex, anterior septum and anterior wall-with septal dyssynchrony.  PAP ~53 mmHg.   Hyperlipidemia with target low density lipoprotein (LDL) cholesterol less than 70 mg/dL    Hypertensive heart disease    Ischemic  cardiomyopathy 07/2015 - 05/2016   a. 07/2015 Echo: EF 45-50%.; Follow-up Echo 08/23/2015: EF 50-55% with mild inferior hypokinesis --> 05/2016: EF 20-25%, diffuse HK. PAP ~53 mmHg.   Kidney disease, chronic, stage [name] (GFR 30-59 ml/min) (HCC) 08/12/2015   Morbid obesity (HCC)    ST elevation myocardial infarction (STEMI) of inferior wall ([place]) 08/12/2015   Subacute presentation for inferior MI with post infarction angina on the following day. EKG still shows injury current. Therefore decided to proceed with intervention of 100% mRCA thrombotic lesion.   Sustained ventricular fibrillation ([place]) 08/22/2015   Underwent EP evaluation with final ICD implantation.    PAST SURGICAL HISTORY   Past Surgical History:  Procedure Laterality Date   CARDIAC CATHETERIZATION N/A 08/12/2015   Procedure: Left Heart Cath and Coronary Angiography;  Surgeon: Leonie Man, MD;  Location: [facility];  Service: Cardiovascular: Inferior STEMI: 100% mid RCA, 90% distal RCA, 100% RPL. 40% ostial and proximal LAD and branch of ramus.   CARDIAC CATHETERIZATION N/A 08/26/2015   Procedure: Left Heart Cath and Coronary Angiography;  Surgeon: Belva Crome, MD;  Location: [facility];  Service: Cardiovascular: To evaluate sustained VT. Widely patent RCA stent and distal PTCA site. Also patent RPL branch. LAD lesion appeared to be more consistent with 50-55% and 40%. Otherwise stable from previous cath. EF estimated 35-45%.  CARDIOVERSION N/A 08/23/2015   Procedure: CARDIOVERSION;  Surgeon: Deboraha Sprang, MD;  Location: [facility];  Service: Cardiovascular;  Laterality: N/A;   CORONARY ANGIOPLASTY WITH STENT PLACEMENT  08/12/2015   Mid RCA 100% reduced to 0% with 2 overlapping Resolute DES.  3.0 x 22 mm with a 3.0 x 12 mm prox overlarp (postdilated to 3.4 mm); PTCA of dRCA 90%. (Very dificult,complex case - tortuous, [place]ok RCA - unable to advance longer stents.  RPL2 noted to have thromboembolic 001% occlusion  - unable to reach.   ELECTROPHYSIOLOGIC STUDY N/A 08/26/2015   Procedure: Electrophysiology Study;  Surgeon: Deboraha Sprang, MD;  Location: [facility];  Service: Cardiovascular;  Laterality: N/A;   EP IMPLANTABLE DEVICE N/A 08/26/2015   Procedure: ICD Implant;  Surgeon: Deboraha Sprang, MD;  Location: [facility];  Service: Cardiovascular;  Laterality: LEFT:  St Jude ICD, serial number [id number].    HEMORRHOID SURGERY     LEA DOPPLERS  06/2019   Lower Extremity Arterial Doppler: Right-mild lower extremity arterial disease with abnormal TBI.  Moderate Left PAD-abnormal TBI.   Lower Extr V Dopplers  04/2019   Lower Venous Reflux Study.:  No evidence of obvious DVT bilaterally.  Reflux noted in Right CFV, GSV in the thigh as well as calf, as well as SSV.  Venous reflux noted in the Left GSV in the thigh, but no evidence in SSV or CFV.   RIGHT/LEFT HEART CATH AND CORONARY ANGIOGRAPHY N/A 06/10/2016   Procedure: Right/Left Heart Cath and Coronary Angiography;  Surgeon: Martinique, Peter M, MD;  [facility] IN[facility]AS[facility]:  Nonobstructive CAD. RCA stents PATENT.  Ost-Prox RCA, 10 %. Ost LM 30 %. Ost LAD 40 %. RI, 40 %; High PCWP  & LVEDP (23 mmHg) ; PAP 43/23/31 mmHg. CO/CI 3.89 / 1/5 (Severely Reduced)   SKIN SURGERY     TRANSTHORACIC ECHOCARDIOGRAM  08/13/2015; 08/23/2015   a. Mild concentric LVH. EF 45-50%. Basal-mid inferior and inferoseptal akinesis with hypokinesis of inferolateral and apical inferior wall. 1 DD. ;; b. Technically difficult study. Definity contrast administered.  Compared to a prior echo in 07/2015, the LVEF is slightly higher at 50-55% with mild inferior hypokinesis.   TRANSTHORACIC ECHOCARDIOGRAM  06/09/2016   EF  20-25%, GR 2 DD. Severe diffuse  hypokinesis with distinct regional wall motion abnormalities.   There is disproportionately severe hypokinesis and thinning of the apex, anterior septum and anterior wall (septal dyssynchrony). However, there is severe dyssynchrony ,  making it difficult to assess regional function.  PA P ~53 mmHg   TRANSTHORACIC ECHOCARDIOGRAM  01/2017   Severely reduced EF.  GI to DD.  Moderate MR.  Mild to have a moderately elevated PAP (peak 81mmHg)   TRANSTHORACIC ECHOCARDIOGRAM  11/2018    EF 15-20%.  Mild dilation.  Global HK.  Moderate RV enlargement with mildly reduced function.  Severe biatrial enlargement.  Moderate pleural effusion.  Moderate MAP:.  Moderate aortic sclerosis.  Mild ascending aortic dilation-41 mm.  Increased CVP estimated 15 mmHg.   05/31/2016 -40% proximal LAD and branch of RI disease.     Immunization History  Administered Date(s) Administered   Fluad Quad(high Dose 65+) 09/16/2018, 10/30/2019   Influenza, High Dose Seasonal PF 10/14/2015, 10/06/2016, 10/20/2017   Influenza-Unspecified 10/20/2014   PFIZER(Purple Top)SARS-COV-2 Vaccination 03/15/2019, 04/13/2019, 10/30/2019   Pneumococcal Conjugate-13 10/06/2016   Pneumococcal Polysaccharide-23 07/21/2012, 08/13/2015   Td 05/20/2007, 08/26/2010   Tdap 02/12/2015   Zoster Recombinat (Shingrix) 06/22/2018,  09/16/2018   Zoster, Live 07/21/2012    MEDICATIONS/ALLERGIES   No outpatient medications have been marked as taking for the 08/05/20 encounter (Appointment) with Leonie Man, MD.    Allergies  Allergen Reactions   Clams [Shellfish Allergy] Shortness Of Breath, Itching and Swelling   Shellfish Allergy Anaphylaxis   Shellfish-Derived Products Shortness Of Breath, Itching and Swelling   Sulfa Antibiotics Other (See Comments)    unknown   Other Itching and Other (See Comments)    Ragweed causes watery eyes, itching, sneezing   Sulfa Antibiotics Other (See Comments)    Pt unsure of reaction, mother told him he was allergic     SOCIAL HISTORY/FAMILY HISTORY   Reviewed in Epic:  Pertinent findings:  Social History   Tobacco Use   Smoking status: Never   Smokeless tobacco: Never  Vaping Use   Vaping Use: Never used  Substance Use  Topics   Alcohol use: Not Currently    Alcohol/week: 3.0 - 4.0 standard drinks    Types: 3 - 4 Shots of liquor per week    Comment: Up until 04-19-2019 spring-3-4 glasses of scotch 3-4 days per week   Drug use: No   Social History   Social History Narrative   He tells me that his daughter died on Halloween 2015/04/19. She had a diving accident at the age of 59 and been quadriplegic for 25 years. She had been incredibly vigorous girl. He showed me her high school graduation picture.She was as he reported beautiful.  Her name was [name].      He still lives with his wife, [name].  He does have a living will and [name] is his Ambulance person.    OBJCTIVE -PE, EKG, labs   Wt Readings from Last 3 Encounters:  05/29/20 242 lb 12.8 oz (110.1 kg)  04/29/20 241 lb 6.4 oz (109.5 kg)  03/25/20 243 lb (110.2 kg)    Physical Exam: There were no vitals taken for this visit. Physical Exam Constitutional:      General: He is not in acute distress.    Appearance: He is obese. He is ill-appearing. He is not toxic-appearing.     Comments: Morbidly obese, ill-appearing-does not seem to be doing well  HENT:     Head: Normocephalic and atraumatic.  Neck:     Vascular: Hepatojugular reflux and JVD present. No carotid bruit.  Cardiovascular:     Rate and Rhythm: Normal rate and regular rhythm. Occasional Extrasystoles are present.    Chest Wall: PMI is displaced.     Heart sounds: S1 normal. Heart sounds are distant. Murmur heard.  Harsh crescendo-decrescendo midsystolic murmur is present with a grade of 2/6 at the upper right sternal border radiating to the neck.    No friction rub. Gallop present. S4 (Versus probably split S2) sounds present.     Comments: Prominently split S2 Pulmonary:     Effort: Pulmonary effort is normal. No respiratory distress.     Breath sounds: No wheezing, rhonchi or rales.     Comments: Diffuse interstitial sounds Musculoskeletal:        General: Swelling present.      Cervical back: Normal range of motion and neck supple.  Skin:    General: Skin is warm.     Comments: Brawny venous stasis changes on both lower legs  Neurological:     General: No focal deficit present.     Mental Status: He is alert and oriented to person, place, and time.  Gait: Gait abnormal.  Psychiatric:        Behavior: Behavior normal.        Thought Content: Thought content normal.        Judgment: Judgment normal.     Comments: Depressed/subdued mood    Adult ECG Report  Rate: *** ;  Rhythm: {rhythm:17366};   Narrative Interpretation: ***  Recent Labs:  ***  Lab Results  Component Value Date   CHOL 141 10/30/2019   HDL 42 10/30/2019   LDLCALC 76 10/30/2019   TRIG 129 10/30/2019   CHOLHDL 3.4 10/30/2019   Lab Results  Component Value Date   CREATININE 1.87 (H) 04/29/2020   BUN 28 (H) 04/29/2020   NA 137 04/29/2020   K 4.4 04/29/2020   CL 97 04/29/2020   CO2 20 04/29/2020   CBC Latest Ref Rng & Units 04/29/2020 03/20/2020 11/29/2019  WBC 3.4 - 10.8 x10E3/uL 15.0(H) 11.3(H) 16.1(H)  Hemoglobin 13.0 - 17.7 g/dL 13.9 13.9 13.5  Hematocrit 37.5 - 51.0 % 43.4 43.0 41.0  Platelets 150 - 450 x10E3/uL 304 369 289    Lab Results  Component Value Date   TSH 10.200 (H) 04/29/2020    ==================================================  COVID-19 Education: The signs and symptoms of COVID-19 were discussed with the patient and how to seek care for testing (follow up with PCP or arrange E-visit).    I spent a total of ***minutes with the patient spent in direct patient consultation.  Additional time spent with chart review  / charting (studies, outside notes, etc): *** min Total Time: *** min  Current medicines are reviewed at length with the patient today.  (+/- concerns) ***  This visit occurred during the SARS-CoV-2 public health emergency.  Safety protocols were in place, including screening questions prior to the visit, additional usage of staff PPE, and  extensive cleaning of exam room while observing appropriate contact time as indicated for disinfecting solutions.  Notice: This dictation was prepared with Dragon dictation along with smaller phrase technology. Any transcriptional errors that result from this process are unintentional and may not be corrected upon review.  Patient Instructions / Medication Changes & Studies & Tests Ordered   There are no Patient Instructions on file for this visit.   Studies Ordered:   No orders of the defined types were placed in this encounter.    Glenetta Hew, M.D., M.S. Interventional Cardiologist   Pager # [phone] Phone # [phone] [address] [place], [place] [address]   Thank you for choosing Heartcare at [facility]!!

## 2020-08-05 ENCOUNTER — Ambulatory Visit: Payer: Medicare Other | Admitting: Cardiology

## 2020-08-06 ENCOUNTER — Telehealth: Payer: Self-pay | Admitting: Cardiology

## 2020-08-06 NOTE — Telephone Encounter (Signed)
Spoke with pt directly. He denies SOB, weight gain, or unusual edema. He states he weighs himself daily and has remained constant. He does confirm a recent GI bug with nausea and diarrhea, however has since recovered. He states he is not sure why his wife called and she "worries too much."   Will forward to Dr. Allison Quarry RN as an Juluis Rainier.

## 2020-08-06 NOTE — Telephone Encounter (Signed)
Pt c/o Shortness Of Breath: STAT if SOB developed within the last 24 hours or pt is noticeably SOB on the phone  1. Are you currently SOB (can you hear that pt is SOB on the phone)? NO  2. How long have you been experiencing SOB? A couple of weeks   3. Are you SOB when sitting or when up moving around? Moving around  4. Are you currently experiencing any other symptoms? Swelling in his feet and [place]   Pt c/o swelling: STAT is pt has developed SOB within 24 hours  If swelling, where is the swelling located? feet  How much weight have you gained and in what time span? NO  Have you gained 3 pounds in a day or 5 pounds in a week? No  Do you have a log of your daily weights (if so, list)? No  Are you currently taking a fluid pill? Yes   Are you currently SOB? No  Have you traveled recently? NO

## 2020-08-14 ENCOUNTER — Ambulatory Visit: Payer: Medicare Other | Admitting: Podiatry

## 2020-08-16 NOTE — Progress Notes (Signed)
Remote ICD transmission.   

## 2020-08-23 ENCOUNTER — Telehealth: Payer: Self-pay | Admitting: Cardiology

## 2020-08-23 NOTE — Telephone Encounter (Signed)
pt has been coughing up alot of phlegm and is barely able to eat a sandwich, please advise.

## 2020-08-23 NOTE — Telephone Encounter (Signed)
Returned the call to the patient and his wife. She stated that the patient has been having worsening symptoms the last few days. He has been coughing up phlegm which is white in color. She stated that he does not sound like he is wheezing but is audibly wet in his lungs and having a hard time catching his breath. He has been sleeping sitting up because he cannot breath when lying down.  She is unable to check his blood pressure, O2 and heart rate at home. She stated that he has mild swelling in both feet and has had no appetite. She did state that the patient has blue toes on both feet.  The patient refuses to go to the ED. They have been made aware about the [place] [facility]. She has been advised to get the patient assessed today due to his worsening symptoms. She has agreed.  The patient has an appointment with Dr. Ellyn Hack on 08/28/20

## 2020-08-28 ENCOUNTER — Other Ambulatory Visit: Payer: Self-pay

## 2020-08-28 ENCOUNTER — Ambulatory Visit (INDEPENDENT_AMBULATORY_CARE_PROVIDER_SITE_OTHER): Payer: Medicare Other | Admitting: Cardiology

## 2020-08-28 ENCOUNTER — Encounter: Payer: Self-pay | Admitting: Cardiology

## 2020-08-28 VITALS — BP 98/66 | HR 63 | Ht 73.0 in | Wt 235.0 lb

## 2020-08-28 DIAGNOSIS — E785 Hyperlipidemia, unspecified: Secondary | ICD-10-CM

## 2020-08-28 DIAGNOSIS — I2510 Atherosclerotic heart disease of native coronary artery without angina pectoris: Secondary | ICD-10-CM

## 2020-08-28 DIAGNOSIS — Z95810 Presence of automatic (implantable) cardiac defibrillator: Secondary | ICD-10-CM | POA: Diagnosis not present

## 2020-08-28 DIAGNOSIS — R7309 Other abnormal glucose: Secondary | ICD-10-CM | POA: Diagnosis not present

## 2020-08-28 DIAGNOSIS — I4901 Ventricular fibrillation: Secondary | ICD-10-CM | POA: Diagnosis not present

## 2020-08-28 DIAGNOSIS — R110 Nausea: Secondary | ICD-10-CM | POA: Insufficient documentation

## 2020-08-28 DIAGNOSIS — Z79899 Other long term (current) drug therapy: Secondary | ICD-10-CM

## 2020-08-28 DIAGNOSIS — I952 Hypotension due to drugs: Secondary | ICD-10-CM | POA: Diagnosis not present

## 2020-08-28 DIAGNOSIS — I447 Left bundle-branch block, unspecified: Secondary | ICD-10-CM

## 2020-08-28 DIAGNOSIS — I420 Dilated cardiomyopathy: Secondary | ICD-10-CM | POA: Diagnosis not present

## 2020-08-28 DIAGNOSIS — I5020 Unspecified systolic (congestive) heart failure: Secondary | ICD-10-CM | POA: Diagnosis not present

## 2020-08-28 DIAGNOSIS — R531 Weakness: Secondary | ICD-10-CM

## 2020-08-28 DIAGNOSIS — Z955 Presence of coronary angioplasty implant and graft: Secondary | ICD-10-CM

## 2020-08-28 DIAGNOSIS — E039 Hypothyroidism, unspecified: Secondary | ICD-10-CM | POA: Insufficient documentation

## 2020-08-28 DIAGNOSIS — E6609 Other obesity due to excess calories: Secondary | ICD-10-CM

## 2020-08-28 DIAGNOSIS — I255 Ischemic cardiomyopathy: Secondary | ICD-10-CM

## 2020-08-28 DIAGNOSIS — Z6831 Body mass index (BMI) 31.0-31.9, adult: Secondary | ICD-10-CM

## 2020-08-28 MED ORDER — AMIODARONE HCL 200 MG PO TABS: 100.0000 mg | ORAL_TABLET | Freq: Every day | ORAL | 3 refills | Status: DC

## 2020-08-28 MED ORDER — EMPAGLIFLOZIN 10 MG PO TABS: 10.0000 mg | ORAL_TABLET | Freq: Every day | ORAL | 3 refills | Status: AC

## 2020-08-28 MED ORDER — ONDANSETRON HCL 4 MG PO TABS: 4.0000 mg | ORAL_TABLET | Freq: Three times a day (TID) | ORAL | 1 refills | Status: AC | PRN

## 2020-08-28 MED ORDER — SPIRONOLACTONE 25 MG PO TABS: 12.5000 mg | ORAL_TABLET | Freq: Every day | ORAL | 6 refills | Status: DC

## 2020-08-28 NOTE — Assessment & Plan Note (Addendum)
Unfortunately, his initial ischemic cardiomyopathy seem to have recovered after his myocardial infarction only to then reduce again potentially related to alcohol but also left bundle branch block.  Follow-up catheter showed widely patent stents, and the RCA infarct would not likely cause such a dramatic decrease in function with global hypokinesis.  Treatment limited by hypotension, dizziness, fatigue as well as nausea.  Continues to "defer "CRT-D

## 2020-08-28 NOTE — Assessment & Plan Note (Addendum)
Extensive RCA PCI as well as PTCA.  Since her overlapping due to inability to traverse tortuous shepherd's crook with longer stents.  Plan: Continue maintenance dose Brilinta 60 mg twice daily -> can be held 5 to 7 days preop for surgery or procedures.

## 2020-08-28 NOTE — Assessment & Plan Note (Signed)
He continues to lose weight.  Is now down all the way to 31 BMI.

## 2020-08-28 NOTE — Assessment & Plan Note (Signed)
Unfortunately, he continues to have low blood pressures.  Plan:   Decrease spironolactone to 12.5 mg with hold parameter (SBP<98 mmHg)  Also hold parameter for Entresto for similar blood pressure.  I told him to take his medications after eating.  This would be predicated on him actually eating something for at least breakfast and dinner. => Hold parameters

## 2020-08-28 NOTE — Assessment & Plan Note (Signed)
I am worried that his nausea could be related to amiodarone.  This may be affecting his p.o. intake.  Plan: As needed Zofran and reduced dose of  amiodarone to 100 mg daily.

## 2020-08-28 NOTE — Assessment & Plan Note (Signed)
  Baseline NYHA Class III, stage D symptoms.  Unfortunately treatment is limited by hypotension and fatigue.  Plan:  No longer on beta-blocker because of hypotension, bradycardia and fatigue -> does get beta-blocker effect from amiodarone which we have reducing to 100 mg daily.  On Entresto low-dose-hold parameters in place (SBP<98 mmHg)  On spironolactone 25 mg daily which I am reducing to 12.5.  On digoxin  We will add Jardiance since he does have A1c of 5.9 and there is a direct CHF indication  Continue Demadex 20 mg daily with PRN dosing for worsening edema.  If it somewhat difficult with his weight loss to determine dry weight, but I really want him to make sure that he is helping out his nutrition by supplementing meals.  Hopefully he will stabilize weights and we can then establish a dry weight close to where it is now that he can then adjust his torsemide dosing.

## 2020-08-28 NOTE — Progress Notes (Addendum)
Primary Care Provider: Girtha Rm, NP-C Cardiologist: Glenetta Hew, MD Electrophysiologist: Virl Axe, MD  Clinic Note: Chief Complaint  Patient presents with   Follow-up    3 and 4 months.  Feeling worse.  Initially felt well now but now worse.  Short of breath, swelling.   Congestive Heart Failure    Notes orthopnea, edema and exertional dyspnea.   Hypotension    Associated with dizziness and fatigue.   Coronary Artery Disease    No angina    ===================================  ASSESSMENT/PLAN   Problem List Items Addressed This Visit       Cardiology Problems   Dilated cardiomyopathy ([place]) (Chronic)    Unfortunately, his initial ischemic cardiomyopathy seem to have recovered after his myocardial infarction only to then reduce again potentially related to alcohol but also left bundle branch block.  Follow-up catheter showed widely patent stents, and the RCA infarct would not likely cause such a dramatic decrease in function with global hypokinesis.  Treatment limited by hypotension, dizziness, fatigue as well as nausea.  Continues to "defer "CRT-D       Relevant Medications   amiodarone (PACERONE) 200 MG tablet   spironolactone (ALDACTONE) 25 MG tablet   Coronary artery disease involving native coronary artery without angina pectoris (Chronic)    Thankfully, he truly has not had angina since his CHF was more controlled.  He has had several follow-up catheterization showing widely patent RCA stents and PTCA site.  Plan: On monotherapy Brilinta at maintenance dose Not on beta-blocker because of bradycardia and fatigue.  On amiodarone. On Entresto along with atorvastatin and Zetia. With no active symptoms, no further ischemic evaluation needed.       Relevant Medications   amiodarone (PACERONE) 200 MG tablet   spironolactone (ALDACTONE) 25 MG tablet   Other Relevant Orders   EKG 12-Lead   Lipid panel   Comprehensive metabolic panel   C-reactive  protein   Hemoglobin A1c   Thyroid Function Panel (THS+T3+T4+Free)   Sedimentation rate   Digoxin level   Hyperlipidemia LDL goal <70 (Chronic)    He is due for lab check .  Last LDL was 76.  We can reassess when labs return.  Continue Zetia and 80 mg atorvastatin.  Check FLP, CMP along with A1c.  (Last A1c was 5.8)       Relevant Medications   amiodarone (PACERONE) 200 MG tablet   spironolactone (ALDACTONE) 25 MG tablet   Other Relevant Orders   EKG 12-Lead   Lipid panel   Comprehensive metabolic panel   Sustained ventricular fibrillation (HCC) (Chronic)    He had EPS showing PM VT.  ICD placed.  Has been maintained on amiodarone.  Plan: With him having nausea and fatigue and continued weight loss along with worsening thyroid levels, we will reduce amiodarone further, 100 mg daily.  Again, he returns to me with the "decision to defer CRT-D"       Relevant Medications   amiodarone (PACERONE) 200 MG tablet   spironolactone (ALDACTONE) 25 MG tablet   LBBB (left bundle branch block) (Chronic)    Brought longstanding IVCD and a atypical left bundle branch pattern.  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  Still "disinclined to pursue ICD upgrade to CRT-D " --> this is not the information that I received from the patient and his wife.  Yes,  they are scared of infection, but I explained to them that is simply an upgrade of the device that is already better with minimal infection risk.  There is no more risk once it is in tenderness with the existing device.       Relevant Medications   amiodarone (PACERONE) 200 MG tablet   spironolactone (ALDACTONE) 25 MG tablet   Other Relevant Orders   EKG 12-Lead   Lipid panel   Comprehensive metabolic panel   C-reactive protein   Hemoglobin A1c   Thyroid Function Panel (THS+T3+T4+Free)   Sedimentation rate    Digoxin level   Hypotension (Chronic)    Unfortunately, he continues to have low blood pressures.  Plan:  Decrease spironolactone to 12.5 mg with hold parameter (SBP<98 mmHg) Also hold parameter for Entresto for similar blood pressure. I told him to take his medications after eating.  This would be predicated on him actually eating something for at least breakfast and dinner. => Hold parameters       Relevant Medications   amiodarone (PACERONE) 200 MG tablet   spironolactone (ALDACTONE) 25 MG tablet   HFrEF (heart failure with reduced ejection fraction) (HCC) - Primary (Chronic)     Baseline NYHA Class III, stage D symptoms.  Unfortunately treatment is limited by hypotension and fatigue.  Plan: No longer on beta-blocker because of hypotension, bradycardia and fatigue -> does get beta-blocker effect from amiodarone which we have reducing to 100 mg daily. On Entresto low-dose-hold parameters in place (SBP<98 mmHg) On spironolactone 25 mg daily which I am reducing to 12.5. On digoxin We will add Jardiance since he does have A1c of 5.9 and there is a direct CHF indication Continue Demadex 20 mg daily with PRN dosing for worsening edema. If it somewhat difficult with his weight loss to determine dry weight, but I really want him to make sure that he is helping out his nutrition by supplementing meals.  Hopefully he will stabilize weights and we can then establish a dry weight close to where it is now that he can then adjust his torsemide dosing.      Relevant Medications   amiodarone (PACERONE) 200 MG tablet   spironolactone (ALDACTONE) 25 MG tablet   Other Relevant Orders   EKG 12-Lead   Lipid panel   Comprehensive metabolic panel   C-reactive protein   Hemoglobin A1c   Thyroid Function Panel (THS+T3+T4+Free)   Sedimentation rate   Digoxin level     Other   Presence of drug coated stent in right coronary artery (Chronic)    Extensive RCA PCI as well as PTCA.  Since her  overlapping due to inability to traverse tortuous shepherd's crook with longer stents.  Plan: Continue maintenance dose Brilinta 60 mg twice daily -> can be held 5 to 7 days preop for surgery or procedures.       Elevated glucose   Relevant Orders   Hemoglobin A1c   Generalized weakness   Relevant Orders   EKG 12-Lead   Lipid panel   Comprehensive metabolic panel   C-reactive protein   Hemoglobin A1c   Thyroid Function Panel (THS+T3+T4+Free)   Sedimentation rate   Digoxin level   Obese (Chronic)    He continues to lose weight.  Is now down all the way to 31 BMI.       Relevant Medications   empagliflozin (JARDIANCE) 10 MG TABS tablet   S/P implantation of automatic cardioverter/defibrillator (AICD) (Chronic)   Relevant Orders   EKG 12-Lead  Lipid panel   Comprehensive metabolic panel   C-reactive protein   Hemoglobin A1c   Thyroid Function Panel (THS+T3+T4+Free)   Sedimentation rate   Digoxin level   On amiodarone therapy (Chronic)    Having nausea symptoms.  Hypothyroidism worsening-now being treated with higher doses of Synthroid.  Also fatigue and poor p.o. intake.  This could all be related to chronic dosing of amiodarone.  Plan: We will reduce dose to 100 mg daily and check ESR/CRP with routine labs.       Relevant Orders   EKG 12-Lead   Lipid panel   Comprehensive metabolic panel   Hypothyroid (Chronic)    We will check a full thyroid panel and labs.  I am little concerned about effect of amiodarone.  Backing down dose to 100 mg amiodarone, thyroid medications being monitored by PCP.       Relevant Orders   EKG 12-Lead   Lipid panel   Comprehensive metabolic panel   C-reactive protein   Hemoglobin A1c   Thyroid Function Panel (THS+T3+T4+Free)   Sedimentation rate   Digoxin level   Nausea in adult (Chronic)    I am worried that his nausea could be related to amiodarone.  This may be affecting his p.o. intake.  Plan: As needed Zofran and reduced  dose of  amiodarone to 100 mg daily.        ===================================  HPI:    Claris Guymon III is a 76 y.o. male with a PMH notable for CAD-posterior STEMI (initially ICM with EF 40 to 45% that improved up to 50 to 55%), LBBB with NICM (EF now 15 to 20%) and chronic HFrEF (NYHA Class III) - s/p AICD (for VT -on amiodarone -- No BiVICD per EP - stating pt declines?), Hypotension, history of excessive Alcohol Use with who presents today for 30-monthfollow-up, but working because of worsening fatigue, dyspnea and edema..[name] CARDIAC HISTORY: CAD:  (July 2017) -> Large inferior STEMI complicated by VT  -> PCI RCA and PTCA PDA.  EF 45 to 50%. May 2018 R&LHC: patent RCA stents & PTCA site . HIGH LVEDP. Mild Pulm V HTN. CO/CI 3.9/1.5) August 2017 -> Sustained VT (ICM) ->  Cath -Patent RCA Stents & PTCA site. High LVEDP ~255mg, EF 35-40%. Ost L/ad ~55%. LBBB EPS - PMVT --> St[employer] Jude AICD (Dr. KlCaryl Comes on maintenance dose amiodarone 12/2018: Referred back to EP to consider BiVICD (CRTD) - Decided to DEFER 2/2 concerns re: COVID  Last seen by Dr. KlCaryl Comesn May 29, 2020 --> reported "doing well .  Some fatigue but minimal shortness of breath." ==> despite my insistence that he consider CRT-D, note indicates that the patient "remains disinclined to undertake CRT-D procedure.  This is stating that the patient has relatively minimal symptoms and limited by the symptoms not associated with heart failure.?   DILATED CARDIOMYOPATHY - CHRONIC HFrEF May 2018: Admitted with Acute HF - ? Related ot EtOH (Cath - above, patents stents). Echo - EF 20-25%, GR II DD. Severe Diffuse HK (apical thinning) => Referred to Adv CHF (Dr. BeHaroldine LawsEcho jan 2019: EF 15-20%. Gr II DD.  Mod MR Feb 2020: Admitted for A on C CHF - > BiV Failure complicated by A on C RF.  Lost to f/u until 11/2018 - EP: STILL DRINKING 5-6 DRINKS OF SCOTCH). Converted from furosemide to torsemide 80 mg BID + Spironolactone 25 mg  (counseled on EtOH moderation).   HaNick StultsII was last seen on March 25, 2020 -> noted significant fatigue and had profoundly low blood pressures.  He was feeling down and very depressed.  Very short of breath with minimal activity.  Able to do his ADLs, but very slow.  Noted orthopnea-sleeping in a chair with PND.  Also wakes up at night to urinate.  Wobbly unsteady gait with some orthostatic symptoms. Weaned off beta-blocker, reduce amiodarone to 200 mg daily. Was told to hold Entresto and spironolactone that day and to monitor pressures prior to taking medications. Referred back to EP to consider ICD upgrade to BiV ICD/CRT-D. => At this visit, was told to take spironolactone at night, again disinclined to take a step to ICD upgrade.?  Stating that he is feeling fine  Recent Hospitalizations: None  Reviewed  CV studies:    The following studies were reviewed today: (if available, images/films reviewed: From Epic Chart or Care Everywhere) No recent studies: Last device check normal.  Interval History:   Nyal Schachter III returns here today as a work in patient.  There have been several telephone calls through the triage nurse over the last couple months.  July 19: Patient denied dyspnea weight gain or unusual edema.  Was suffering from GI bug.  Indicated that his "wife worries too much ". August 5: Wife called in again stating that he is having worsening symptoms over the past few days.  Coughing up phlegm.  Wheezing, audibly wet in the lungs.  Hard time catching his breath.  Sleeping sitting up.  Mild foot swelling.  No appetite.  Refused ER visit.  Today he presents for follow-up stating that initially after the first changes we made last visit he did a lot better.  His energy improved, and he was feeling much better.  Unfortunately over the last month or so he is started to progressively get worse.  He has been lots of other negative inguinal issues such as an anal fissure and poor  appetite that is bothering him as well.  He definitely has orthopnea sleeping probably at about 35 to 40 degrees elevation, but really denies true PND.[name]  He has relatively significant left ankle greater than right edema with purplish discoloration of the feet.  He denies any chest pain or pressure with rest or exertion, but is profoundly dyspneic.  He walks around the house using a walker, but is somewhat immobilized due to the weak leg weakness and fatigue.  He also gets profoundly dyspneic with more than routine walking around the house.   He denies any arrhythmia symptoms, he has had some lightheadedness but no syncope or near syncope.  He has had lots of nausea and poor p.o. intake.  He has lost quite a bit of weight, mostly because of lack of p.o. intake.  He is only having maybe 1 scotch drink a day.  Usually this is what helps him go to sleep.  His wife indicates that this is a dramatic drop in consumption.  CV Review of Symptoms (Summary) Cardiovascular ROS: positive for - dyspnea on exertion, edema, orthopnea, shortness of breath, and profound fatigue and exercise intolerance.  Nausea, queasy feeling.  Poor appetite.  Extreme malaise negative for - chest pain, irregular heartbeat, palpitations, paroxysmal nocturnal dyspnea, rapid heart rate, or although he has had lightheadedness and dizziness, no syncope or near syncope, TIA or amaurosis fugax.  Not really walking enough to note claudication.  REVIEWED OF SYSTEMS   Review of Systems  Constitutional:  Positive for malaise/fatigue and weight loss (Still losing weight, not  as fast but continuing to lose.  Poor p.o. intake).  HENT:  Negative for congestion and nosebleeds.   Respiratory:  Positive for cough (Seems to be a little bit better.  Wife notes that his "rattling "), shortness of breath (Worse with exertion, but some at rest as well.) and wheezing (According to the wife, but he is not sure if that is true).   Cardiovascular:         Per HPI  Gastrointestinal:  Positive for nausea. Negative for blood in stool, constipation, diarrhea, melena and vomiting.       Poor appetite.  Almost food intolerance.  Genitourinary:  Positive for frequency (Frequent urination with diuretic.). Negative for hematuria.  Musculoskeletal:  Positive for falls (He has not had any recent falls, but had fallen a couple times prior to his EP visit in May.  Citing leg weakness and not dizziness or syncope.) and joint pain.  Neurological:  Positive for dizziness (Orthostatic along with some vertigo.) and weakness (Legs are very weak.). Negative for tingling and tremors.  Psychiatric/Behavioral:  Positive for depression and memory loss. The patient is not nervous/anxious and does not have insomnia.    I have reviewed and (if needed) personally updated the patient's problem list, medications, allergies, past medical and surgical history, social and family history.   PAST MEDICAL HISTORY   Past Medical History:  Diagnosis Date   AICD (automatic cardioverter/defibrillator) present 08/26/2015   STJ   Anxiety    Atherosclerotic heart disease of native coronary artery with angina pectoris ([place]) 07/2015   a. 07/2015 Posterior STEMI/PCI: LM nl, LAd 40ost, RI 40, RCA 119m(3.0x22 Resolute Integrity DES distal, 3.0x12 Resolute Integrity DES prox), 90d (PTCA), RPDA small, nl, RPLB2 100 - too small for PTCA. -- patent in f/u cath 08/2015 & 05/2016 - patent RCA stents (~10%), RI 40%, LM ~30% ost LAD ~40%.   Chronic combined systolic and diastolic CHF (congestive heart failure) ([place] 07/2015   a. 07/2015 [facility]: EF 45-50%, basal-mid inf and infsept AK, inflat, apical inf, and apical septal HK, Gr1 DD, triv MR.;; b - Echo 05/2016 -> EF 20-25%, Gr 2 DD.  Severe diffuse HK disproportionately severe HK and thinning of apex, anterior septum and anterior wall-with septal dyssynchrony.  PAP ~53 mmHg.   Hyperlipidemia with target low density lipoprotein (LDL) cholesterol less than  70 mg/dL    Hypertensive heart disease    Ischemic cardiomyopathy 07/2015 - 05/2016   a. 07/2015 Echo: EF 45-50%.; Follow-up Echo 08/23/2015: EF 50-55% with mild inferior hypokinesis --> 05/2016: EF 20-25%, diffuse HK. PAP ~53 mmHg.   Kidney disease, chronic, stage III (GFR 30-59 ml/min) (HCC) 08/12/2015   Morbid obesity (HCC)    ST elevation myocardial infarction (STEMI) [place] 08/12/2015   Subacute presentation for inferior MI with post infarction angina on the following day. EKG still shows injury current. Therefore decided to proceed with intervention of 100% mRCA thrombotic lesion.   Sustained ventricular fibrillation ([place] 08/22/2015   Underwent EP evaluation with final ICD implantation.    PAST SURGICAL HISTORY   Past Surgical History:  Procedure Laterality Date   CARDIAC CATHETERIZATION N/A 08/12/2015   Procedure: Left Heart Cath and Coronary Angiography;  Surgeon: DLeonie Man MD;  [facility];  Service: Cardiovascular: Inferior STEMI: 100% mid RCA, 90% distal RCA, 100% RPL. 40% ostial and proximal LAD and branch of ramus.   CARDIAC CATHETERIZATION N/A 08/26/2015   Procedure: Left Heart Cath and Coronary Angiography;  Surgeon: Belva Crome, MD;  Location: [place] [facility];  Service: Cardiovascular: To evaluate sustained VT. Widely patent RCA stent and distal PTCA site. Also patent RPL branch. LAD lesion appeared to be more consistent with 50-55% and 40%. Otherwise stable from previous cath. EF estimated 35-45%.   CARDIOVERSION N/A 08/23/2015   Procedure: CARDIOVERSION;  Surgeon: Deboraha Sprang, MD;  Location: [facility];  Service: Cardiovascular;  Laterality: N/A;   CORONARY ANGIOPLASTY WITH STENT PLACEMENT  08/12/2015   Mid RCA 100% reduced to 0% with 2 overlapping Resolute DES.  3.0 x 22 mm with a 3.0 x 12 mm prox overlarp (postdilated to 3.4 mm); PTCA of dRCA 90%. (Very dificult,complex case - tortuous, [place] RCA - unable to advance longer  stents.  RPL2 noted to have thromboembolic 342% occlusion - unable to reach.   ELECTROPHYSIOLOGIC STUDY N/A 08/26/2015   Procedure: Electrophysiology Study;  Surgeon: Deboraha Sprang, MD;  Location: [place] [facility];  Service: Cardiovascular;  Laterality: N/A;   EP IMPLANTABLE DEVICE N/A 08/26/2015   Procedure: ICD Implant;  Surgeon: Deboraha Sprang, MD;  Location: [place] [facility];  Service: Cardiovascular;  Laterality: LEFT:  St Jude ICD, serial number [id number].    HEMORRHOID SURGERY     LEA DOPPLERS  06/2019   Lower Extremity Arterial Doppler: Right-mild lower extremity arterial disease with abnormal TBI.  Moderate Left PAD-abnormal TBI.   Lower Extr V Dopplers  04/2019   Lower Venous Reflux Study.:  No evidence of obvious DVT bilaterally.  Reflux noted in Right CFV, GSV in the thigh as well as calf, as well as SSV.  Venous reflux noted in the Left GSV in the thigh, but no evidence in SSV or CFV.   RIGHT/LEFT HEART CATH AND CORONARY ANGIOGRAPHY N/A 06/10/2016   Procedure: Right/Left Heart Cath and Coronary Angiography;  Surgeon: Martinique, Peter M, MD;  Location[facility] IN[facility]ASIVE [facility] LAB:  Nonobstructive CAD. RCA stents PATENT.  Ost-Prox RCA, 10 %. Ost LM 30 %. Ost LAD 40 %. RI, 40 %; High PCWP  & LVEDP (23 mmHg) ; PAP 43/23/31 mmHg. CO/CI 3.89 / 1/5 (Severely Reduced)   SKIN SURGERY     TRANSTHORACIC ECHOCARDIOGRAM  08/13/2015; 08/23/2015   a. Mild concentric LVH. EF 45-50%. Basal-mid inferior and inferoseptal akinesis with hypokinesis of inferolateral and apical inferior wall. 1 DD. ;; b. Technically difficult study. Definity contrast administered.  Compared to a prior echo in 07/2015, the LVEF is slightly higher at 50-55% with mild inferior hypokinesis.   TRANSTHORACIC ECHOCARDIOGRAM  06/09/2016   EF  20-25%, GR 2 DD. Severe diffuse  hypokinesis with distinct regional wall motion abnormalities.   There is disproportionately severe hypokinesis and thinning of the apex, anterior septum and anterior wall (septal  dyssynchrony). However, there is severe dyssynchrony , making it difficult to assess regional function.  PA P ~53 mmHg   TRANSTHORACIC ECHOCARDIOGRAM  01/2017   Severely reduced EF.  GI to DD.  Moderate MR.  Mild to have a moderately elevated PAP (peak 53mHg)   TRANSTHORACIC ECHOCARDIOGRAM  11/2018    EF 15-20%.  Mild dilation.  Global HK.  Moderate RV enlargement with mildly reduced function.  Severe biatrial enlargement.  Moderate pleural effusion.  Moderate MAP:.  Moderate aortic sclerosis.  Mild ascending aortic dilation-41 mm.  Increased CVP estimated 15 mmHg.   05/31/2016     Immunization History  Administered Date(s) Administered   Fluad Quad(high Dose 65+) 09/16/2018, 10/30/2019   Influenza, High Dose  Seasonal PF 10/14/2015, 10/06/2016, 10/20/2017   Influenza-Unspecified 10/20/2014   PFIZER(Purple Top)SARS-COV-2 Vaccination 03/15/2019, 04/13/2019, 10/30/2019   Pneumococcal Conjugate-13 10/06/2016   Pneumococcal Polysaccharide-23 07/21/2012, 08/13/2015   Td 05/20/2007, 08/26/2010   Tdap 02/12/2015   Zoster Recombinat (Shingrix) 06/22/2018, 09/16/2018   Zoster, Live 07/21/2012    MEDICATIONS/ALLERGIES   Current Meds  Medication Sig   acetaminophen (TYLENOL) 325 MG tablet Take 2 tablets (650 mg total) by mouth every 4 (four) hours as needed for headache or mild pain.   atorvastatin (LIPITOR) 80 MG tablet Take 1 tablet (80 mg total) by mouth daily. TAKE 1 TABLET BY MOUTH DAILY AT 6:00 PM.   BRILINTA 60 MG TABS tablet TAKE 1 TABLET(60 MG) BY MOUTH TWICE DAILY   Cholecalciferol (VITAMIN D) 50 MCG (2000 UT) CAPS Take 0.0005 capsules (1 Units total) by mouth daily.   Coenzyme Q10 (COQ10) 200 MG CAPS Take 200 mg by mouth daily.   digoxin (LANOXIN) 0.125 MG tablet Take 1 tablet (125 mcg total) by mouth daily.   empagliflozin (JARDIANCE) 10 MG TABS tablet Take 1 tablet (10 mg total) by mouth daily.   ezetimibe (ZETIA) 10 MG tablet Take 1 tablet (10 mg total) by mouth daily.    levothyroxine (SYNTHROID) 125 MCG tablet TAKE 1 TABLET BY MOUTH EVERY DAY   Multiple Vitamins-Minerals (MULTIVITAMIN ADULT EXTRA C PO) TAKE 1 TABLET BY MOUTH DAILY   ondansetron (ZOFRAN) 4 MG tablet Take 1 tablet (4 mg total) by mouth every 8 (eight) hours as needed for nausea or vomiting.   sacubitril-valsartan (ENTRESTO) 24-26 MG TAKE 1 TABLET BY MOUTH TWICE DAILY   torsemide (DEMADEX) 20 MG tablet TAKE 1 TABLET(20 MG) BY MOUTH DAILY   venlafaxine (EFFEXOR) 75 MG tablet TAKE 1 TABLET(75 MG) BY MOUTH DAILY   [DISCONTINUED] amiodarone (PACERONE) 200 MG tablet Take 1 tablet (200 mg total) by mouth daily.   [DISCONTINUED] spironolactone (ALDACTONE) 25 MG tablet TAKE 1 TABLET(25 MG) BY MOUTH DAILY    Allergies  Allergen Reactions   Clams [Shellfish Allergy] Shortness Of Breath, Itching and Swelling   Shellfish Allergy Anaphylaxis   Shellfish-Derived Products Shortness Of Breath, Itching and Swelling   Sulfa Antibiotics Other (See Comments)    unknown   Other Itching and Other (See Comments)    Ragweed causes watery eyes, itching, sneezing   Sulfa Antibiotics Other (See Comments)    Pt unsure of reaction, mother told him he was allergic     SOCIAL HISTORY/FAMILY HISTORY   Reviewed in Epic:  Pertinent findings:  Social History   Tobacco Use   Smoking status: Never   Smokeless tobacco: Never  Vaping Use   Vaping Use: Never used  Substance Use Topics   Alcohol use: Not Currently    Alcohol/week: 3.0 - 4.0 standard drinks    Types: 3 - 4 Shots of liquor per week    Comment: Up until 04-13-2019 spring-3-4 glasses of scotch 3-4 days per week   Drug use: No   Social History   Social History Narrative   He tells me that his daughter died on Halloween 04-13-15. She had a diving accident at the age of 79 and been quadriplegic for 25 years. She had been incredibly vigorous girl. He showed me her high school graduation picture.She was as he reported beautiful.  Her name was [name].      He still  lives with his wife, [name].  He does have a living will and [name] is his Ambulance person.  Is a former heavy scotch drinker.  Now cut down from 5-6 drinks a day to 1 only.    OBJCTIVE -PE, EKG, labs   Wt Readings from Last 3 Encounters:  08/28/20 235 lb (106.6 kg)  05/29/20 242 lb 12.8 oz (110.1 kg)  04/29/20 241 lb 6.4 oz (109.5 kg)    Physical Exam: BP 98/66 (BP Location: Left Arm, Patient Position: Sitting, Cuff Size: Normal)   Pulse 63   Ht '6\' 1"'  (1.854 m)   Wt 235 lb (106.6 kg)   SpO2 99%   BMI 31.00 kg/m  Physical Exam Vitals reviewed.  Constitutional:      General: He is not in acute distress.    Appearance: He is ill-appearing (Somewhat chronically ill-appearing). He is not toxic-appearing or diaphoretic (But pale.).     Comments: Notable weight loss.  Little disheveled.  HENT:     Head: Normocephalic and atraumatic.  Neck:     Vascular: Hepatojugular reflux present. No carotid bruit or JVD (JVP 7 cm water.).  Cardiovascular:     Rate and Rhythm: Normal rate and regular rhythm. Occasional Extrasystoles are present.    Chest Wall: PMI is displaced (Laterally displaced and sustained.).     Pulses: Decreased pulses (Difficult to palpate because of edema.  Likely 1+ pedal pulses.).     Heart sounds: S1 normal and S2 normal. Heart sounds are distant. Murmur heard.  Medium-pitched blowing holosystolic murmur is present with a grade of 1/6 at the apex.  High-pitched harsh crescendo-decrescendo midsystolic murmur of grade 2/6 is also present at the upper right sternal border radiating to the neck.    Gallop present. S4 (Also potential split S2.) sounds present.  Pulmonary:     Effort: Pulmonary effort is normal. No respiratory distress.     Breath sounds: Normal breath sounds. No wheezing, rhonchi or rales.     Comments: Mild diffuse interstitial sounds. Chest:     Chest wall: No tenderness.  Musculoskeletal:        General: Swelling present.     Cervical  back: Normal range of motion and neck supple.     Right lower leg: Edema (1-2+ pitting) present.     Left lower leg: Edema (2+ pitting) present.  Skin:    Coloration: Skin is pale.     Comments: Bilateral feet (left worse than the right violaceous discoloration with mild varicosities.  Neurological:     General: No focal deficit present.     Mental Status: He is alert and oriented to person, place, and time.     Gait: Gait abnormal (Very weak.  Slow unsteady gait.  Was on a wheelchair here.).     Adult ECG Report  Rate: 63;  Rhythm: normal sinus rhythm and 1 AVB, atypical broad LBBB ;   Narrative Interpretation: QRS seems even wider.  Otherwise stable.   Recent Labs: Most recent labs reviewed.  He is due for lipids to be checked.  Should be checked by October. Lab Results  Component Value Date   CHOL 141 10/30/2019   HDL 42 10/30/2019   LDLCALC 76 10/30/2019   TRIG 129 10/30/2019   CHOLHDL 3.4 10/30/2019   Lab Results  Component Value Date   CREATININE 1.87 (H) 04/29/2020   BUN 28 (H) 04/29/2020   NA 137 04/29/2020   K 4.4 04/29/2020   CL 97 04/29/2020   CO2 20 04/29/2020   CBC Latest Ref Rng & Units 04/29/2020 03/20/2020 11/29/2019  WBC 3.4 - 10.8 x10E3/uL  15.0(H) 11.3(H) 16.1(H)  Hemoglobin 13.0 - 17.7 g/dL 13.9 13.9 13.5  Hematocrit 37.5 - 51.0 % 43.4 43.0 41.0  Platelets 150 - 450 x10E3/uL 304 369 289    Lab Results  Component Value Date   TSH 10.200 (H) 04/29/2020     ==================================================  COVID-19 Education: The signs and symptoms of COVID-19 were discussed with the patient and how to seek care for testing (follow up with PCP or arrange E-visit).    I spent a total of 64 minutes with the patient spent in direct patient consultation.  Additional time spent with chart review  / charting (studies, outside notes, etc): 41 min Total Time: 95 min  Current medicines are reviewed at length with the patient today.  (+/- concerns) blood  pressure is still very low.  Nausea.  This visit occurred during the SARS-CoV-2 public health emergency.  Safety protocols were in place, including screening questions prior to the visit, additional usage of staff PPE, and extensive cleaning of exam room while observing appropriate contact time as indicated for disinfecting solutions.  Notice: This dictation was prepared with Dragon dictation along with smaller phrase technology. Any transcriptional errors that result from this process are unintentional and may not be corrected upon review.  Patient Instructions / Medication Changes & Studies & Tests Ordered   Patient Instructions  Medication Instructions:  DECREASE AMIODARONE  1/2 TABLET OF 200 MG DAILY  DECREASE SPIRONOLACTONE  1/2 TABLET OF 25 MG DAILY     MAY TAKE zOFRAN 4 MG  AS NEEDED DAILY FOR NAUSEA  START JARDIANCE 10 MG DAILY    *If you need a refill on your cardiac medications before your next appointment, please call your pharmacy*   Lab Work: CBC LIPID CMP DIGOXIN THYROID PANEL ESR CRP HGBA1C  If you have labs (blood work) drawn today and your tests are completely normal, you will receive your results only by: [facility] (if you have MyChart) OR A paper copy in the mail If you have any lab test that is abnormal or we need to change your treatment, we will call you to review the results.   Testing/Procedures:  NOT NEEDED  Follow-Up: At [facility], you and your health needs are our priority.  As part of our continuing mission to provide you with exceptional heart care, we have created designated Provider Care Teams.  These Care Teams include your primary Cardiologist (physician) and Advanced Practice Providers (APPs -  Physician Assistants and Nurse Practitioners) who all work together to provide you with the care you need, when you need it.     Your next appointment:   3 month(s)  The format for your next appointment:   In Person  Provider:    Glenetta Hew, MD   Other Instructions    INCREASE PROTEIN INTAKE    Studies Ordered:   Orders Placed This Encounter  Procedures   Lipid panel   Comprehensive metabolic panel   C-reactive protein   Hemoglobin A1c   Thyroid Function Panel (THS+T3+T4+Free)   Sedimentation rate   Digoxin level   EKG 12-Lead     Glenetta Hew, M.D., M.S. Interventional Cardiologist   Pager # [phone] Phone # [phone] [address]. [place], [place] [address]   Thank you for choosing Heartcare at [facility]!!

## 2020-08-28 NOTE — Assessment & Plan Note (Signed)
He had EPS showing PM VT.  ICD placed.  Has been maintained on amiodarone.  Plan: With him having nausea and fatigue and continued weight loss along with worsening thyroid levels, we will reduce amiodarone further, 100 mg daily.  Again, he returns to me with the "decision to defer CRT-D"

## 2020-08-28 NOTE — Assessment & Plan Note (Signed)
Having nausea symptoms.  Hypothyroidism worsening-now being treated with higher doses of Synthroid.  Also fatigue and poor p.o. intake.  This could all be related to chronic dosing of amiodarone.  Plan: We will reduce dose to 100 mg daily and check ESR/CRP with routine labs.

## 2020-08-28 NOTE — Assessment & Plan Note (Addendum)
He is due for lab check .  Last LDL was 76.  We can reassess when labs return.  Continue Zetia and 80 mg atorvastatin.  Check FLP, CMP along with A1c.  (Last A1c was 5.8)

## 2020-08-28 NOTE — Assessment & Plan Note (Signed)
We will check a full thyroid panel and labs.  I am little concerned about effect of amiodarone.  Backing down dose to 100 mg amiodarone, thyroid medications being monitored by PCP.

## 2020-08-28 NOTE — Patient Instructions (Addendum)
Medication Instructions:  DECREASE AMIODARONE  1/2 TABLET OF 200 MG DAILY  DECREASE SPIRONOLACTONE  1/2 TABLET OF 25 MG DAILY     MAY TAKE zOFRAN 4 MG  AS NEEDED DAILY FOR NAUSEA  START JARDIANCE 10 MG DAILY    *If you need a refill on your cardiac medications before your next appointment, please call your pharmacy*   Lab Work: CBC LIPID CMP DIGOXIN THYROID PANEL ESR CRP HGBA1C  If you have labs (blood work) drawn today and your tests are completely normal, you will receive your results only by: [place] (if you have MyChart) OR A paper copy in the mail If you have any lab test that is abnormal or we need to change your treatment, we will call you to review the results.   Testing/Procedures:  NOT NEEDED  Follow-Up: At [facility], you and your health needs are our priority.  As part of our continuing mission to provide you with exceptional heart care, we have created designated Provider Care Teams.  These Care Teams include your primary Cardiologist (physician) and Advanced Practice Providers (APPs -  Physician Assistants and Nurse Practitioners) who all work together to provide you with the care you need, when you need it.     Your next appointment:   3 month(s)  The format for your next appointment:   In Person  Provider:   Glenetta Hew, MD   Other Instructions    INCREASE PROTEIN INTAKE

## 2020-08-28 NOTE — Assessment & Plan Note (Signed)
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  Still "disinclined to pursue ICD upgrade to CRT-D " --> this is not the information that I received from the patient and his wife.  Yes, they are scared of infection, but I explained to them that is simply an upgrade of the device that is already better with minimal infection risk.  There is no more risk once it is in tenderness with the existing device.

## 2020-08-28 NOTE — Assessment & Plan Note (Signed)
Thankfully, he truly has not had angina since his CHF was more controlled.  He has had several follow-up catheterization showing widely patent RCA stents and PTCA site.  Plan:  On monotherapy Brilinta at maintenance dose  Not on beta-blocker because of bradycardia and fatigue.  On amiodarone.  On Entresto along with atorvastatin and Zetia.  With no active symptoms, no further ischemic evaluation needed.

## 2020-08-30 NOTE — Telephone Encounter (Signed)
Patient was seen in office 08/28/20

## 2020-08-31 ENCOUNTER — Inpatient Hospital Stay (HOSPITAL_COMMUNITY)
Admission: EM | Admit: 2020-08-31 | Discharge: 2020-09-05 | DRG: 291 | Disposition: A | Discharge status: Skilled Nursing Facility | Payer: Medicare Other | Attending: Internal Medicine | Admitting: Internal Medicine

## 2020-08-31 ENCOUNTER — Inpatient Hospital Stay (HOSPITAL_COMMUNITY): Payer: Medicare Other

## 2020-08-31 ENCOUNTER — Encounter (HOSPITAL_COMMUNITY): Payer: Self-pay

## 2020-08-31 ENCOUNTER — Emergency Department (HOSPITAL_COMMUNITY): Payer: Medicare Other

## 2020-08-31 DIAGNOSIS — I5023 Acute on chronic systolic (congestive) heart failure: Secondary | ICD-10-CM

## 2020-08-31 DIAGNOSIS — Z8249 Family history of ischemic heart disease and other diseases of the circulatory system: Secondary | ICD-10-CM

## 2020-08-31 DIAGNOSIS — N1832 Chronic kidney disease, stage 3b: Secondary | ICD-10-CM | POA: Diagnosis present

## 2020-08-31 DIAGNOSIS — T460X5A Adverse effect of cardiac-stimulant glycosides and drugs of similar action, initial encounter: Secondary | ICD-10-CM | POA: Diagnosis not present

## 2020-08-31 DIAGNOSIS — I5043 Acute on chronic combined systolic (congestive) and diastolic (congestive) heart failure: Secondary | ICD-10-CM | POA: Diagnosis not present

## 2020-08-31 DIAGNOSIS — E785 Hyperlipidemia, unspecified: Secondary | ICD-10-CM | POA: Diagnosis present

## 2020-08-31 DIAGNOSIS — R296 Repeated falls: Secondary | ICD-10-CM | POA: Diagnosis present

## 2020-08-31 DIAGNOSIS — R2689 Other abnormalities of gait and mobility: Secondary | ICD-10-CM | POA: Diagnosis not present

## 2020-08-31 DIAGNOSIS — N179 Acute kidney failure, unspecified: Secondary | ICD-10-CM

## 2020-08-31 DIAGNOSIS — I4901 Ventricular fibrillation: Secondary | ICD-10-CM | POA: Diagnosis not present

## 2020-08-31 DIAGNOSIS — Z7401 Bed confinement status: Secondary | ICD-10-CM | POA: Diagnosis not present

## 2020-08-31 DIAGNOSIS — I9589 Other hypotension: Secondary | ICD-10-CM | POA: Diagnosis not present

## 2020-08-31 DIAGNOSIS — I252 Old myocardial infarction: Secondary | ICD-10-CM | POA: Diagnosis not present

## 2020-08-31 DIAGNOSIS — I10 Essential (primary) hypertension: Secondary | ICD-10-CM | POA: Diagnosis not present

## 2020-08-31 DIAGNOSIS — I255 Ischemic cardiomyopathy: Secondary | ICD-10-CM | POA: Diagnosis present

## 2020-08-31 DIAGNOSIS — E162 Hypoglycemia, unspecified: Secondary | ICD-10-CM | POA: Diagnosis not present

## 2020-08-31 DIAGNOSIS — I447 Left bundle-branch block, unspecified: Secondary | ICD-10-CM | POA: Diagnosis not present

## 2020-08-31 DIAGNOSIS — I959 Hypotension, unspecified: Secondary | ICD-10-CM | POA: Diagnosis not present

## 2020-08-31 DIAGNOSIS — I130 Hypertensive heart and chronic kidney disease with heart failure and stage 1 through stage 4 chronic kidney disease, or unspecified chronic kidney disease: Secondary | ICD-10-CM | POA: Diagnosis not present

## 2020-08-31 DIAGNOSIS — Z882 Allergy status to sulfonamides status: Secondary | ICD-10-CM

## 2020-08-31 DIAGNOSIS — I2510 Atherosclerotic heart disease of native coronary artery without angina pectoris: Secondary | ICD-10-CM | POA: Diagnosis present

## 2020-08-31 DIAGNOSIS — Z833 Family history of diabetes mellitus: Secondary | ICD-10-CM

## 2020-08-31 DIAGNOSIS — Z955 Presence of coronary angioplasty implant and graft: Secondary | ICD-10-CM

## 2020-08-31 DIAGNOSIS — Z79899 Other long term (current) drug therapy: Secondary | ICD-10-CM

## 2020-08-31 DIAGNOSIS — B356 Tinea cruris: Secondary | ICD-10-CM | POA: Diagnosis present

## 2020-08-31 DIAGNOSIS — E039 Hypothyroidism, unspecified: Secondary | ICD-10-CM | POA: Diagnosis not present

## 2020-08-31 DIAGNOSIS — I083 Combined rheumatic disorders of mitral, aortic and tricuspid valves: Secondary | ICD-10-CM | POA: Diagnosis present

## 2020-08-31 DIAGNOSIS — I248 Other forms of acute ischemic heart disease: Secondary | ICD-10-CM | POA: Diagnosis present

## 2020-08-31 DIAGNOSIS — I952 Hypotension due to drugs: Secondary | ICD-10-CM | POA: Diagnosis not present

## 2020-08-31 DIAGNOSIS — R5381 Other malaise: Secondary | ICD-10-CM | POA: Diagnosis not present

## 2020-08-31 DIAGNOSIS — N183 Chronic kidney disease, stage 3 unspecified: Secondary | ICD-10-CM | POA: Diagnosis present

## 2020-08-31 DIAGNOSIS — Z91013 Allergy to seafood: Secondary | ICD-10-CM

## 2020-08-31 DIAGNOSIS — Z20822 Contact with and (suspected) exposure to covid-19: Secondary | ICD-10-CM | POA: Diagnosis not present

## 2020-08-31 DIAGNOSIS — R338 Other retention of urine: Secondary | ICD-10-CM | POA: Diagnosis present

## 2020-08-31 DIAGNOSIS — N401 Enlarged prostate with lower urinary tract symptoms: Secondary | ICD-10-CM | POA: Diagnosis present

## 2020-08-31 DIAGNOSIS — F32A Depression, unspecified: Secondary | ICD-10-CM | POA: Diagnosis not present

## 2020-08-31 DIAGNOSIS — F419 Anxiety disorder, unspecified: Secondary | ICD-10-CM | POA: Diagnosis not present

## 2020-08-31 DIAGNOSIS — I5021 Acute systolic (congestive) heart failure: Secondary | ICD-10-CM | POA: Diagnosis not present

## 2020-08-31 DIAGNOSIS — R001 Bradycardia, unspecified: Secondary | ICD-10-CM | POA: Diagnosis not present

## 2020-08-31 DIAGNOSIS — D729 Disorder of white blood cells, unspecified: Secondary | ICD-10-CM | POA: Diagnosis not present

## 2020-08-31 DIAGNOSIS — I472 Ventricular tachycardia: Secondary | ICD-10-CM | POA: Diagnosis not present

## 2020-08-31 DIAGNOSIS — R7303 Prediabetes: Secondary | ICD-10-CM | POA: Diagnosis not present

## 2020-08-31 DIAGNOSIS — E559 Vitamin D deficiency, unspecified: Secondary | ICD-10-CM | POA: Diagnosis present

## 2020-08-31 DIAGNOSIS — I700 Atherosclerosis of aorta: Secondary | ICD-10-CM | POA: Diagnosis present

## 2020-08-31 DIAGNOSIS — K219 Gastro-esophageal reflux disease without esophagitis: Secondary | ICD-10-CM | POA: Diagnosis present

## 2020-08-31 DIAGNOSIS — R7309 Other abnormal glucose: Secondary | ICD-10-CM | POA: Diagnosis not present

## 2020-08-31 DIAGNOSIS — Z79890 Hormone replacement therapy (postmenopausal): Secondary | ICD-10-CM

## 2020-08-31 DIAGNOSIS — M6281 Muscle weakness (generalized): Secondary | ICD-10-CM | POA: Diagnosis not present

## 2020-08-31 DIAGNOSIS — I420 Dilated cardiomyopathy: Secondary | ICD-10-CM | POA: Diagnosis not present

## 2020-08-31 DIAGNOSIS — R5383 Other fatigue: Secondary | ICD-10-CM | POA: Diagnosis not present

## 2020-08-31 DIAGNOSIS — I872 Venous insufficiency (chronic) (peripheral): Secondary | ICD-10-CM | POA: Diagnosis not present

## 2020-08-31 DIAGNOSIS — Z8679 Personal history of other diseases of the circulatory system: Secondary | ICD-10-CM

## 2020-08-31 DIAGNOSIS — R000 Tachycardia, unspecified: Secondary | ICD-10-CM | POA: Diagnosis not present

## 2020-08-31 DIAGNOSIS — N170 Acute kidney failure with tubular necrosis: Secondary | ICD-10-CM | POA: Diagnosis not present

## 2020-08-31 DIAGNOSIS — Z7902 Long term (current) use of antithrombotics/antiplatelets: Secondary | ICD-10-CM

## 2020-08-31 DIAGNOSIS — R778 Other specified abnormalities of plasma proteins: Secondary | ICD-10-CM | POA: Diagnosis not present

## 2020-08-31 DIAGNOSIS — J90 Pleural effusion, not elsewhere classified: Secondary | ICD-10-CM | POA: Diagnosis not present

## 2020-08-31 DIAGNOSIS — L304 Erythema intertrigo: Secondary | ICD-10-CM | POA: Diagnosis present

## 2020-08-31 DIAGNOSIS — Z7984 Long term (current) use of oral hypoglycemic drugs: Secondary | ICD-10-CM

## 2020-08-31 DIAGNOSIS — I517 Cardiomegaly: Secondary | ICD-10-CM | POA: Diagnosis not present

## 2020-08-31 DIAGNOSIS — R531 Weakness: Secondary | ICD-10-CM

## 2020-08-31 DIAGNOSIS — I4891 Unspecified atrial fibrillation: Secondary | ICD-10-CM | POA: Diagnosis not present

## 2020-08-31 DIAGNOSIS — E46 Unspecified protein-calorie malnutrition: Secondary | ICD-10-CM | POA: Diagnosis not present

## 2020-08-31 DIAGNOSIS — Z95810 Presence of automatic (implantable) cardiac defibrillator: Secondary | ICD-10-CM

## 2020-08-31 LAB — CBC WITH DIFFERENTIAL/PLATELET
Abs Immature Granulocytes: 0.08 10*3/uL — ABNORMAL HIGH (ref 0.00–0.07)
Basophils Absolute: 0 10*3/uL (ref 0.0–0.1)
Basophils Relative: 0 %
Eosinophils Absolute: 0 10*3/uL (ref 0.0–0.5)
Eosinophils Relative: 0 %
HCT: 46.1 % (ref 39.0–52.0)
Hemoglobin: 14.3 g/dL (ref 13.0–17.0)
Immature Granulocytes: 1 %
Lymphocytes Relative: 8 %
Lymphs Abs: 1.2 10*3/uL (ref 0.7–4.0)
MCH: 26.8 pg (ref 26.0–34.0)
MCHC: 31 g/dL (ref 30.0–36.0)
MCV: 86.5 fL (ref 80.0–100.0)
Monocytes Absolute: 0.9 10*3/uL (ref 0.1–1.0)
Monocytes Relative: 6 %
Neutro Abs: 13.6 10*3/uL — ABNORMAL HIGH (ref 1.7–7.7)
Neutrophils Relative %: 85 %
Platelets: 309 10*3/uL (ref 150–400)
RBC: 5.33 MIL/uL (ref 4.22–5.81)
RDW: 17.2 % — ABNORMAL HIGH (ref 11.5–15.5)
WBC: 15.8 10*3/uL — ABNORMAL HIGH (ref 4.0–10.5)
nRBC: 0 % (ref 0.0–0.2)

## 2020-08-31 LAB — TROPONIN I (HIGH SENSITIVITY)
Troponin I (High Sensitivity): 75 ng/L — ABNORMAL HIGH (ref <18)
Troponin I (High Sensitivity): 65 ng/L — ABNORMAL HIGH (ref <18)

## 2020-08-31 LAB — COMPREHENSIVE METABOLIC PANEL
ALT: 29 U/L (ref 0–44)
AST: 42 U/L — ABNORMAL HIGH (ref 15–41)
Albumin: 3.7 g/dL (ref 3.5–5.0)
Alkaline Phosphatase: 90 U/L (ref 38–126)
Anion gap: 14 (ref 5–15)
BUN: 65 mg/dL — ABNORMAL HIGH (ref 8–23)
CO2: 18 mmol/L — ABNORMAL LOW (ref 22–32)
Calcium: 9.2 mg/dL (ref 8.9–10.3)
Chloride: 98 mmol/L (ref 98–111)
Creatinine, Ser: 3.85 mg/dL — ABNORMAL HIGH (ref 0.61–1.24)
GFR, Estimated: 15 mL/min — ABNORMAL LOW (ref >60)
Glucose, Bld: 57 mg/dL — ABNORMAL LOW (ref 70–99)
Potassium: 5 mmol/L (ref 3.5–5.1)
Sodium: 130 mmol/L — ABNORMAL LOW (ref 135–145)
Total Bilirubin: 2.2 mg/dL — ABNORMAL HIGH (ref 0.3–1.2)
Total Protein: 6.2 g/dL — ABNORMAL LOW (ref 6.5–8.1)

## 2020-08-31 LAB — I-STAT CHEM 8, ED
BUN: 60 mg/dL — ABNORMAL HIGH (ref 8–23)
Calcium, Ion: 1.05 mmol/L — ABNORMAL LOW (ref 1.15–1.40)
Chloride: 101 mmol/L (ref 98–111)
Creatinine, Ser: 3.7 mg/dL — ABNORMAL HIGH (ref 0.61–1.24)
Glucose, Bld: 51 mg/dL — ABNORMAL LOW (ref 70–99)
HCT: 46 % (ref 39.0–52.0)
Hemoglobin: 15.6 g/dL (ref 13.0–17.0)
Potassium: 4.9 mmol/L (ref 3.5–5.1)
Sodium: 131 mmol/L — ABNORMAL LOW (ref 135–145)
TCO2: 21 mmol/L — ABNORMAL LOW (ref 22–32)

## 2020-08-31 LAB — TSH: TSH: 5.767 u[IU]/mL — ABNORMAL HIGH (ref 0.350–4.500)

## 2020-08-31 LAB — DIGOXIN LEVEL: Digoxin Level: 2.9 ng/mL (ref 0.8–2.0)

## 2020-08-31 LAB — T4, FREE: Free T4: 1.47 ng/dL — ABNORMAL HIGH (ref 0.61–1.12)

## 2020-08-31 LAB — MAGNESIUM: Magnesium: 2.4 mg/dL (ref 1.7–2.4)

## 2020-08-31 LAB — BRAIN NATRIURETIC PEPTIDE: B Natriuretic Peptide: 4467.9 pg/mL — ABNORMAL HIGH (ref 0.0–100.0)

## 2020-08-31 MED ORDER — TICAGRELOR 60 MG PO TABS
60.0000 mg | ORAL_TABLET | Freq: Two times a day (BID) | ORAL | Status: DC
  Administered 2020-09-01 – 2020-09-05 (×10): 60 mg via ORAL
  Filled 2020-08-31 (×11): qty 1

## 2020-08-31 MED ORDER — SODIUM CHLORIDE 0.9 % IV SOLN: INTRAVENOUS | Status: AC | PRN

## 2020-08-31 MED ORDER — ETOMIDATE 2 MG/ML IV SOLN
INTRAVENOUS | Status: AC | PRN
Start: 2020-08-31 — End: 2020-08-31

## 2020-08-31 MED ORDER — ACETAMINOPHEN 325 MG PO TABS
650.0000 mg | ORAL_TABLET | ORAL | Status: DC | PRN
  Filled 2020-08-31: qty 2

## 2020-08-31 MED ORDER — FOLIC ACID 1 MG PO TABS
1.0000 mg | ORAL_TABLET | Freq: Every day | ORAL | Status: DC
  Administered 2020-08-31 – 2020-09-05 (×6): 1 mg via ORAL
  Filled 2020-08-31 (×6): qty 1

## 2020-08-31 MED ORDER — ROCURONIUM BROMIDE 50 MG/5ML IV SOLN: INTRAVENOUS | Status: AC | PRN

## 2020-08-31 MED ORDER — FENTANYL CITRATE (PF) 100 MCG/2ML IJ SOLN
INTRAMUSCULAR | Status: AC | PRN
  Administered 2020-08-31: 50 ug via INTRAVENOUS

## 2020-08-31 MED ORDER — LEVOTHYROXINE SODIUM 25 MCG PO TABS
125.0000 ug | ORAL_TABLET | Freq: Every day | ORAL | Status: DC
  Administered 2020-08-31 – 2020-09-05 (×6): 125 ug via ORAL
  Filled 2020-08-31 (×6): qty 1

## 2020-08-31 MED ORDER — SODIUM CHLORIDE 0.9 % IV BOLUS: 250.0000 mL | Freq: Once | INTRAVENOUS | Status: DC

## 2020-08-31 MED ORDER — EZETIMIBE 10 MG PO TABS
10.0000 mg | ORAL_TABLET | Freq: Every day | ORAL | Status: DC
  Administered 2020-09-01 – 2020-09-05 (×5): 10 mg via ORAL
  Filled 2020-08-31 (×5): qty 1

## 2020-08-31 MED ORDER — THIAMINE HCL 100 MG PO TABS
100.0000 mg | ORAL_TABLET | Freq: Every day | ORAL | Status: DC
  Administered 2020-08-31 – 2020-09-05 (×6): 100 mg via ORAL
  Filled 2020-08-31 (×6): qty 1

## 2020-08-31 MED ORDER — ATORVASTATIN CALCIUM 80 MG PO TABS
80.0000 mg | ORAL_TABLET | Freq: Every day | ORAL | Status: DC
  Administered 2020-08-31 – 2020-09-05 (×6): 80 mg via ORAL
  Filled 2020-08-31 (×5): qty 1
  Filled 2020-08-31: qty 2
  Filled 2020-08-31: qty 1

## 2020-08-31 MED ORDER — ADULT MULTIVITAMIN W/MINERALS CH
1.0000 | ORAL_TABLET | Freq: Every day | ORAL | Status: DC
  Administered 2020-08-31 – 2020-09-05 (×6): 1 via ORAL
  Filled 2020-08-31 (×6): qty 1

## 2020-08-31 MED ORDER — COQ10 200 MG PO CAPS: 200.0000 mg | ORAL_CAPSULE | Freq: Every day | ORAL | Status: DC

## 2020-08-31 MED ORDER — HEPARIN SODIUM (PORCINE) 5000 UNIT/ML IJ SOLN
5000.0000 [IU] | Freq: Three times a day (TID) | INTRAMUSCULAR | Status: DC
  Administered 2020-08-31 – 2020-09-05 (×15): 5000 [IU] via SUBCUTANEOUS
  Filled 2020-08-31 (×16): qty 1

## 2020-08-31 MED ORDER — VITAMIN D 25 MCG (1000 UNIT) PO TABS
1000.0000 [IU] | ORAL_TABLET | Freq: Every day | ORAL | Status: DC
  Administered 2020-08-31 – 2020-09-05 (×6): 1000 [IU] via ORAL
  Filled 2020-08-31 (×6): qty 1

## 2020-08-31 MED ORDER — LACTATED RINGERS IV BOLUS
250.0000 mL | Freq: Once | INTRAVENOUS | Status: AC
  Administered 2020-08-31: 250 mL via INTRAVENOUS

## 2020-08-31 NOTE — ED Notes (Signed)
Critical dig level 2.9 called form lab, Dr Doren Custard notified.

## 2020-08-31 NOTE — ED Triage Notes (Signed)
Pt bib [facility] from home c/o hypotension and fall. Per ems, pt fell twice today, one around 5am and another around 11am. Denies any injuries. Did not hit the head. Not on blood thinner.  Hx afib. A&O * 4   Meds given by ems:   -500 ml NS  Vitals with ems:   - BP 70 palpated  -HR 46

## 2020-08-31 NOTE — ED Notes (Signed)
Patient transported to Ultrasound 

## 2020-08-31 NOTE — Consult Note (Signed)
Cardiology Consultation:   Patient ID: Leonard [name] MRN: [id number]; DOB: 10/11/1945  Admit date: 08/31/2020 Date of Consult: 08/31/2020  PCP:  Girtha Rm, NP-C   [place] Providers Cardiologist:  Glenetta Hew, MD  Electrophysiologist:  Virl Axe, MD  Advanced Heart Failure:  Glori Bickers, MD  {    Patient Profile:   Leonard [name] is a 75 y.o. male with a hx of coronary artery disease, ischemic/nonischemic cardiomyopathy, previous ICD, chronic combined systolic/diastolic congestive heart failure, hypertension, hyperlipidemia, chronic stage [name] kidney disease who is being seen 08/31/2020 for the evaluation of hypotension and acute on chronic combined systolic/diastolic congestive heart failure at the request of [name] MD.  History of Present Illness:   Patient with history of ischemic/nonischemic cardiomyopathy and chronic congestive heart failure.  Cardiac catheterization May 2018 showed nonobstructive coronary disease with patent stent in the right coronary artery.  Last echocardiogram November 2020 showed ejection fraction 15 to 20%, mild left ventricular hypertrophy, mild left ventricular enlargement, mild RV dysfunction, moderate right ventricular enlargement, severe biatrial enlargement, mild mitral regurgitation, moderate tricuspid regurgitation and mildly dilated ascending aorta.  He was seen in the office on August 10 and Jardiance added to his medical regimen.  Blood pressure at that time 98/66.  Patient has had 2 falls in the last 24 hours.  Patient states that each time he tripped and fell.  There was no syncope.  EMS was called to assist when he fell and his systolic blood pressure was 70.  He was transferred to the hospital for further management.  He was given IV fluids in the emergency room and his systolic blood pressure is now 100.  He has chronic dyspnea on exertion as well as chronic orthopnea.  Chronic mild pedal edema.  All of these are  unchanged by his report.  He denies chest pain, palpitations but has had decreased p.o. intake.  Mild intermittent nausea but not at present.  Cardiology now asked to evaluate.   Past Medical History:  Diagnosis Date   AICD (automatic cardioverter/defibrillator) present 08/26/2015   STJ   Anxiety    Atherosclerotic heart disease of native coronary artery with angina pectoris ([place]) 07/2015   a. 07/2015 Posterior STEMI/PCI: LM nl, LAd 40ost, RI 40, RCA 16m (3.0x22 Resolute Integrity DES distal, 3.0x12 Resolute Integrity DES prox), 90d (PTCA), RPDA small, nl, RPLB2 100 - too small for PTCA. -- patent in f/u cath 08/2015 & 05/2016 - patent RCA stents (~10%), RI 40%, LM ~30% ost LAD ~40%.   Chronic combined systolic and diastolic CHF (congestive heart failure) ([place]) 07/2015   a. 07/2015 Ehco: EF 45-50%, basal-mid inf and infsept AK, inflat, apical inf, and apical septal HK, Gr1 DD, triv MR.;; b - Echo 05/2016 -> EF 20-25%, Gr 2 DD.  Severe diffuse HK disproportionately severe HK and thinning of apex, anterior septum and anterior wall-with septal dyssynchrony.  PAP ~53 mmHg.   Hyperlipidemia with target low density lipoprotein (LDL) cholesterol less than 70 mg/dL    Hypertensive heart disease    Ischemic cardiomyopathy 07/2015 - 05/2016   a. 07/2015 Echo: EF 45-50%.; Follow-up Echo 08/23/2015: EF 50-55% with mild inferior hypokinesis --> 05/2016: EF 20-25%, diffuse HK. PAP ~53 mmHg.   Kidney disease, chronic, stage [name] (GFR 30-59 ml/min) (HCC) 08/12/2015   Morbid obesity (HCC)    ST elevation myocardial infarction (STEMI) of inferior wall ([place]) 08/12/2015   Subacute presentation for inferior MI with post infarction angina on the following day. EKG  still shows injury current. Therefore decided to proceed with intervention of 100% mRCA thrombotic lesion.   Sustained ventricular fibrillation ([place]) 08/22/2015   Underwent EP evaluation with final ICD implantation.    Past Surgical History:  Procedure  Laterality Date   CARDIAC CATHETERIZATION N/A 08/12/2015   Procedure: Left Heart Cath and Coronary Angiography;  Surgeon: Leonie Man, MD;  Location: [facility];  Service: Cardiovascular: Inferior STEMI: 100% mid RCA, 90% distal RCA, 100% RPL. 40% ostial and proximal LAD and branch of ramus.   CARDIAC CATHETERIZATION N/A 08/26/2015   Procedure: Left Heart Cath and Coronary Angiography;  Surgeon: Belva Crome, MD;  Location: [facility];  Service: Cardiovascular: To evaluate sustained VT. Widely patent RCA stent and distal PTCA site. Also patent RPL branch. LAD lesion appeared to be more consistent with 50-55% and 40%. Otherwise stable from previous cath. EF estimated 35-45%.   CARDIOVERSION N/A 08/23/2015   Procedure: CARDIOVERSION;  Surgeon: Deboraha Sprang, MD;  Location: [facility];  Service: Cardiovascular;  Laterality: N/A;   CORONARY ANGIOPLASTY WITH STENT PLACEMENT  08/12/2015   Mid RCA 100% reduced to 0% with 2 overlapping Resolute DES.  3.0 x 22 mm with a 3.0 x 12 mm prox overlarp (postdilated to 3.4 mm); PTCA of dRCA 90%. (Very dificult,complex case - tortuous, [place] RCA - unable to advance longer stents.  RPL2 noted to have thromboembolic 778% occlusion - unable to reach.   ELECTROPHYSIOLOGIC STUDY N/A 08/26/2015   Procedure: Electrophysiology Study;  Surgeon: Deboraha Sprang, MD;  Location: [place] [facility];  Service: Cardiovascular;  Laterality: N/A;   EP IMPLANTABLE DEVICE N/A 08/26/2015   Procedure: ICD Implant;  Surgeon: Deboraha Sprang, MD;  Location: [facility];  Service: Cardiovascular;  Laterality: LEFT:  St Jude ICD, serial number [id number].    HEMORRHOID SURGERY     LEA DOPPLERS  06/2019   Lower Extremity Arterial Doppler: Right-mild lower extremity arterial disease with abnormal TBI.  Moderate Left PAD-abnormal TBI.   Lower Extr V Dopplers  04/2019   Lower Venous Reflux Study.:  No evidence of obvious DVT bilaterally.  Reflux noted in Right CFV, GSV in  the thigh as well as calf, as well as SSV.  Venous reflux noted in the Left GSV in the thigh, but no evidence in SSV or CFV.   RIGHT/LEFT HEART CATH AND CORONARY ANGIOGRAPHY N/A 06/10/2016   Procedure: Right/Left Heart Cath and Coronary Angiography;  Surgeon: Martinique, Peter M, MD;  Location: [facility]:  Nonobstructive CAD. RCA stents PATENT.  Ost-Prox RCA, 10 %. Ost LM 30 %. Ost LAD 40 %. RI, 40 %; High PCWP  & LVEDP (23 mmHg) ; PAP 43/23/31 mmHg. CO/CI 3.89 / 1/5 (Severely Reduced)   SKIN SURGERY     TRANSTHORACIC ECHOCARDIOGRAM  08/13/2015; 08/23/2015   a. Mild concentric LVH. EF 45-50%. Basal-mid inferior and inferoseptal akinesis with hypokinesis of inferolateral and apical inferior wall. 1 DD. ;; b. Technically difficult study. Definity contrast administered.  Compared to a prior echo in 07/2015, the LVEF is slightly higher at 50-55% with mild inferior hypokinesis.   TRANSTHORACIC ECHOCARDIOGRAM  06/09/2016   EF  20-25%, GR 2 DD. Severe diffuse  hypokinesis with distinct regional wall motion abnormalities.   There is disproportionately severe hypokinesis and thinning of the apex, anterior septum and anterior wall (septal dyssynchrony). However, there is severe dyssynchrony , making it difficult to assess regional function.  PA P ~53 mmHg  TRANSTHORACIC ECHOCARDIOGRAM  01/2017   Severely reduced EF.  GI to DD.  Moderate MR.  Mild to have a moderately elevated PAP (peak 63mmHg)   TRANSTHORACIC ECHOCARDIOGRAM  11/2018    EF 15-20%.  Mild dilation.  Global HK.  Moderate RV enlargement with mildly reduced function.  Severe biatrial enlargement.  Moderate pleural effusion.  Moderate MAP:.  Moderate aortic sclerosis.  Mild ascending aortic dilation-41 mm.  Increased CVP estimated 15 mmHg.     Inpatient Medications: Scheduled Meds:  atorvastatin  80 mg Oral Daily   CoQ10  200 mg Oral Daily   ezetimibe  10 mg Oral Daily   folic acid  1 mg Oral Daily   heparin  5,000 Units Subcutaneous Q8H    levothyroxine  125 mcg Oral Daily   multivitamin with minerals  1 tablet Oral Daily   thiamine  100 mg Oral Daily   ticagrelor  60 mg Oral BID   Vitamin D  1 Units Oral Daily   Continuous Infusions:  PRN Meds: acetaminophen  Allergies:    Allergies  Allergen Reactions   Clams [Shellfish Allergy] Shortness Of Breath, Itching and Swelling   Shellfish Allergy Anaphylaxis   Shellfish-Derived Products Shortness Of Breath, Itching and Swelling   Sulfa Antibiotics Other (See Comments)    unknown   Other Itching and Other (See Comments)    Ragweed causes watery eyes, itching, sneezing   Sulfa Antibiotics Other (See Comments)    Pt unsure of reaction, mother told him he was allergic     Social History:   Social History   Socioeconomic History   Marital status: Married    Spouse name: Not on file   Number of children: Not on file   Years of education: Not on file   Highest education level: Not on file  Occupational History   Occupation: Chief Executive Officer    Comment: Retired  Tobacco Use   Smoking status: Never   Smokeless tobacco: Never  Vaping Use   Vaping Use: Never used  Substance and Sexual Activity   Alcohol use: Yes    Alcohol/week: 3.0 - 4.0 standard drinks    Types: 3 - 4 Shots of liquor per week    Comment: 1-1/2 glasses of scotch daily   Drug use: No   Sexual activity: Not on file  Other Topics Concern   Not on file  Social History Narrative   He tells me that his daughter died on Halloween 2015-04-18. She had a diving accident at the age of 43 and been quadriplegic for 25 years. She had been incredibly vigorous girl. He showed me her high school graduation picture.She was as he reported beautiful.  Her name was [name].      He still lives with his wife, [name].  He does have a living will and [name] is his Ambulance person.      Is a former heavy scotch drinker.  Now cut down from 5-6 drinks a day to 1 only.   Social Determinants of Health   Financial Resource Strain:  Not on file  Food Insecurity: Not on file  Transportation Needs: Not on file  Physical Activity: Not on file  Stress: Not on file  Social Connections: Not on file  Intimate Partner Violence: Not on file    Family History:    Family History  Problem Relation Age of Onset   Diabetes Mother    Heart disease Paternal Grandmother    Heart disease Paternal Grandfather  ROS:  Please see the history of present illness.  No fevers, chills, productive cough, hemoptysis.  Mild nausea but not at present. All other ROS reviewed and negative.     Physical Exam/Data:   Vitals:   08/31/20 1500 08/31/20 1530 08/31/20 1600 08/31/20 1630  BP: 106/89 100/70 100/70 100/70  Pulse: 65 64 68 63  Resp: (!) 23 13 (!) 23 16  Temp:      SpO2: 97% 93% 99% 98%  Weight:      Height:        Intake/Output Summary (Last 24 hours) at 08/31/2020 1657 Last data filed at 08/31/2020 1520 Gross per 24 hour  Intake 187.5 ml  Output --  Net 187.5 ml   Last 3 Weights 08/31/2020 08/28/2020 05/29/2020  Weight (lbs) 225 lb 235 lb 242 lb 12.8 oz  Weight (kg) 102.059 kg 106.595 kg 110.133 kg     Body mass index is 29.69 kg/m.  General:  Well nourished, well developed, chronically ill in no acute distress HEENT: normal Lymph: no adenopathy Neck: no JVD Endocrine:  No thryomegaly Vascular: No carotid bruits; FA pulses 2+ bilaterally without bruits  Cardiac:  normal S1, S2; RRR; 2/6 systolic murmur apex. Lungs:  clear to auscultation bilaterally, no wheezing, rhonchi or rales  Abd: soft, nontender, no hepatomegaly  Ext: 1+ edema; abrasion over second digit left lower extremity from fall Musculoskeletal:  No deformities, BUE and BLE strength normal and equal Skin: warm and dry  Neuro:  CNs 2-12 intact, no focal abnormalities noted Psych:  Normal affect   EKG:  The EKG was personally reviewed and demonstrates: Sinus rhythm with first-degree AV block and left bundle branch block.   Laboratory  Data:  High Sensitivity Troponin:   Recent Labs  Lab 08/31/20 1310 08/31/20 1430  TROPONINIHS 75* 65*     Chemistry Recent Labs  Lab 08/31/20 1310 08/31/20 1354  NA 130* 131*  K 5.0 4.9  CL 98 101  CO2 18*  --   GLUCOSE 57* 51*  BUN 65* 60*  CREATININE 3.85* 3.70*  CALCIUM 9.2  --   GFRNONAA 15*  --   ANIONGAP 14  --     Recent Labs  Lab 08/31/20 1310  PROT 6.2*  ALBUMIN 3.7  AST 42*  ALT 29  ALKPHOS 90  BILITOT 2.2*   Hematology Recent Labs  Lab 08/31/20 1310 08/31/20 1354  WBC 15.8*  --   RBC 5.33  --   HGB 14.3 15.6  HCT 46.1 46.0  MCV 86.5  --   MCH 26.8  --   MCHC 31.0  --   RDW 17.2*  --   PLT 309  --    BNP Recent Labs  Lab 08/31/20 1310  BNP 4,467.9*       Radiology/Studies:  DG Chest Portable 1 View  Result Date: 08/31/2020 CLINICAL DATA:  Hypotension and fall. EXAM: PORTABLE CHEST 1 VIEW COMPARISON:  Chest radiograph dated 12/17/2018. FINDINGS: The heart is enlarged. Vascular calcifications are seen in the aortic arch. Mild left basilar atelectasis/airspace disease is noted. A small left pleural effusion may contribute. The right lung is clear without pleural effusion. There is no pneumothorax. A left subclavian approach cardiac device is redemonstrated. Degenerative changes are seen in the spine. IMPRESSION: Mild left basilar atelectasis/airspace disease. A small left pleural effusion may contribute. Aortic Atherosclerosis (ICD10-I70.0). Electronically Signed   By: Zerita Boers M.D.   On: 08/31/2020 13:58     Assessment and Plan:  Acute on chronic systolic congestive heart failure-patient presents with hypotension after recent fall.  Patient also has acute renal insufficiency.  Blood pressure improved with gentle hydration.  We will hold all cardiac medications including Entresto, Jardiance, spironolactone, Demadex and digoxin.  Follow blood pressure and renal function.  Will reinstitute low-dose medications as tolerated.  Would not  hydrate further given baseline reduced LV function and congestive heart failure. Elevated digoxin level-dig level is 2.9 and last dose was yesterday morning.  Likely due to to interaction with amiodarone and acute renal insufficiency.  We will hold digoxin.  He is not symptomatic at present and therefore I do not think he needs Digibind. History of mixed ischemic/nonischemic cardiomyopathy-we will repeat echocardiogram.  Hold medications as outlined above and reinstitute later as renal function and blood pressure improved. Acute on chronic stage [name] kidney disease-patient's creatinine on April 11 was 1.87 and now increased to 3.85.  He has had difficulty urinating and a Foley has been placed.  We will also hold Entresto and diuretics.  Follow renal function closely. History of ventricular tachycardia-patient is on chronic amiodarone.  We will hold for now and resume as he improves. Coronary artery disease-continue brilinta and statin. Minimally elevated troponin-no clear trend, no chest pain and occurring in the setting of acute renal insufficiency.  No plans for further ischemia evaluation. S/P ICD   Risk Assessment/Risk Scores:   New York Heart Association (NYHA) Functional Class NYHA Class [name]   For questions or updates, please contact [facility] Please consult [email or web address] for contact info under    Signed, Kirk Ruths, MD  08/31/2020 4:57 PM

## 2020-08-31 NOTE — ED Notes (Signed)
Cardiology Provider at bed side

## 2020-08-31 NOTE — ED Notes (Signed)
ED Provider at bedside. 

## 2020-08-31 NOTE — ED Provider Notes (Signed)
[facility] EMERGENCY DEPARTMENT Provider Note   CSN: [id number] Arrival date & time: 08/31/20  1228     History No chief complaint on file.   Leonard [name] is a 75 y.o. male.  HPI Patient presents by EMS after 2 falls at home.  He initially fell at around 5:30 AM.  He had another fall approximately 1 hour prior to arrival.  Patient describes these falls as mechanical in nature.  He states that he tripped over a lip on the bathroom doorway and slipped on water in the bathroom for the second fall.  He denies striking his head.  He denies any current symptoms.  EMS arrived on scene and found him to be hypotensive and bradycardic.  They report blood pressure of 70 over palp.  They report a heart rate of 46.  Patient denies any recent infectious symptoms.  He denies any chest discomfort.  Per chart review, patient is followed by cardiology.  He last saw them 3 days ago.  Per cardiology note, patient does have a history of hypertension, bradycardia, and fatigue.  He is on amiodarone, low-dose Entresto, spironolactone, and digoxin.  EF is now 15 to 20% based on echocardiogram in November 2020.    Past Medical History:  Diagnosis Date   AICD (automatic cardioverter/defibrillator) present 08/26/2015   STJ   Anxiety    Atherosclerotic heart disease of native coronary artery with angina pectoris ([place]) 07/2015   a. 07/2015 Posterior STEMI/PCI: LM nl, LAd 40ost, RI 40, RCA 163m (3.0x22 Resolute Integrity DES distal, 3.0x12 Resolute Integrity DES prox), 90d (PTCA), RPDA small, nl, RPLB2 100 - too small for PTCA. -- patent in f/u cath 08/2015 & 05/2016 - patent RCA stents (~10%), RI 40%, LM ~30% ost LAD ~40%.   Chronic combined systolic and diastolic CHF (congestive heart failure) ([place]) 07/2015   a. 07/2015 Ehco: EF 45-50%, basal-mid inf and infsept AK, inflat, apical inf, and apical septal HK, Gr1 DD, triv MR.;; b - Echo 05/2016 -> EF 20-25%, Gr 2 DD.  Severe diffuse HK  disproportionately severe HK and thinning of apex, anterior septum and anterior wall-with septal dyssynchrony.  PAP ~53 mmHg.   Hyperlipidemia with target low density lipoprotein (LDL) cholesterol less than 70 mg/dL    Hypertensive heart disease    Ischemic cardiomyopathy 07/2015 - 05/2016   a. 07/2015 Echo: EF 45-50%.; Follow-up Echo 08/23/2015: EF 50-55% with mild inferior hypokinesis --> 05/2016: EF 20-25%, diffuse HK. PAP ~53 mmHg.   Kidney disease, chronic, stage [name] (GFR 30-59 ml/min) (HCC) 08/12/2015   Morbid obesity (HCC)    ST elevation myocardial infarction (STEMI) of inferior wall ([place]) 08/12/2015   Subacute presentation for inferior MI with post infarction angina on the following day. EKG still shows injury current. Therefore decided to proceed with intervention of 100% mRCA thrombotic lesion.   Sustained ventricular fibrillation ([place]) 08/22/2015   Underwent EP evaluation with final ICD implantation.    Patient Active Problem List   Diagnosis Date Noted   Hypothyroid 08/28/2020   Nausea in adult 08/28/2020   On amiodarone therapy 09/26/2019   Hypotension 05/21/2019   HFrEF (heart failure with reduced ejection fraction) ([place]) 05/21/2019   CKD (chronic kidney disease) stage 3, GFR 30-59 ml/min (HCC) 05/21/2019   VT (ventricular tachycardia) ([place]) 04/11/2019   Sinus tachycardia 04/11/2019   Debility 12/23/2018   LBBB (left bundle branch block)    Palliative care by specialist    DNR (do not resuscitate) discussion  Pressure injury of skin 12/18/2018   Generalized weakness 12/17/2018   Excessive drinking of alcohol 12/17/2018   Lower extremity cellulitis 12/17/2018   Depression 06/14/2018   Need for shingles vaccine 06/14/2018   S/P implantation of automatic cardioverter/defibrillator (AICD)    Grief 02/27/2016   Dilated cardiomyopathy ([place])    Obese    Chronic combined systolic and diastolic CHF (congestive heart failure) ([place])    Sustained ventricular fibrillation ([place])  08/20/2015   Neutrophilia 08/14/2015   Elevated glucose 08/14/2015   ST elevation myocardial infarction (STEMI) of true posterior wall, subsequent episode of care ([place]) 08/12/2015   Coronary artery disease involving native coronary artery without angina pectoris 08/12/2015   Presence of drug coated stent in right coronary artery 08/12/2015   Essential hypertension 02/12/2015   Hyperlipidemia LDL goal <70 02/12/2015   Erectile dysfunction 02/12/2015   BPH (benign prostatic hyperplasia) 02/12/2015    Past Surgical History:  Procedure Laterality Date   CARDIAC CATHETERIZATION N/A 08/12/2015   Procedure: Left Heart Cath and Coronary Angiography;  Surgeon: Leonie Man, MD;  Location: [facility];  Service: Cardiovascular: Inferior STEMI: 100% mid RCA, 90% distal RCA, 100% RPL. 40% ostial and proximal LAD and branch of ramus.   CARDIAC CATHETERIZATION N/A 08/26/2015   Procedure: Left Heart Cath and Coronary Angiography;  Surgeon: Belva Crome, MD;  Location: [facility];  Service: Cardiovascular: To evaluate sustained VT. Widely patent RCA stent and distal PTCA site. Also patent RPL branch. LAD lesion appeared to be more consistent with 50-55% and 40%. Otherwise stable from previous cath. EF estimated 35-45%.   CARDIOVERSION N/A 08/23/2015   Procedure: CARDIOVERSION;  Surgeon: Deboraha Sprang, MD;  Location: [facility];  Service: Cardiovascular;  Laterality: N/A;   CORONARY ANGIOPLASTY WITH STENT PLACEMENT  08/12/2015   Mid RCA 100% reduced to 0% with 2 overlapping Resolute DES.  3.0 x 22 mm with a 3.0 x 12 mm prox overlarp (postdilated to 3.4 mm); PTCA of dRCA 90%. (Very dificult,complex case - tortuous, [place] RCA - unable to advance longer stents.  RPL2 noted to have thromboembolic 381% occlusion - unable to reach.   ELECTROPHYSIOLOGIC STUDY N/A 08/26/2015   Procedure: Electrophysiology Study;  Surgeon: Deboraha Sprang, MD;  Location: [facility];  Service: Cardiovascular;   Laterality: N/A;   EP IMPLANTABLE DEVICE N/A 08/26/2015   Procedure: ICD Implant;  Surgeon: Deboraha Sprang, MD;  Location: [facility];  Service: Cardiovascular;  Laterality: LEFT:  St Jude ICD, serial number [id number].    HEMORRHOID SURGERY     LEA DOPPLERS  06/2019   Lower Extremity Arterial Doppler: Right-mild lower extremity arterial disease with abnormal TBI.  Moderate Left PAD-abnormal TBI.   Lower Extr V Dopplers  04/2019   Lower Venous Reflux Study.:  No evidence of obvious DVT bilaterally.  Reflux noted in Right CFV, GSV in the thigh as well as calf, as well as SSV.  Venous reflux noted in the Left GSV in the thigh, but no evidence in SSV or CFV.   RIGHT/LEFT HEART CATH AND CORONARY ANGIOGRAPHY N/A 06/10/2016   Procedure: Right/Left Heart Cath and Coronary Angiography;  Surgeon: Martinique, Peter M, MD;  Location: [facility] INVASIVE [facility]:  Nonobstructive CAD. RCA stents PATENT.  Ost-Prox RCA, 10 %. Ost LM 30 %. Ost LAD 40 %. RI, 40 %; High PCWP  & LVEDP (23 mmHg) ; PAP 43/23/31 mmHg. CO/CI 3.89 / 1/5 (Severely Reduced)   SKIN  SURGERY     TRANSTHORACIC ECHOCARDIOGRAM  08/13/2015; 08/23/2015   a. Mild concentric LVH. EF 45-50%. Basal-mid inferior and inferoseptal akinesis with hypokinesis of inferolateral and apical inferior wall. 1 DD. ;; b. Technically difficult study. Definity contrast administered.  Compared to a prior echo in 07/2015, the LVEF is slightly higher at 50-55% with mild inferior hypokinesis.   TRANSTHORACIC ECHOCARDIOGRAM  06/09/2016   EF  20-25%, GR 2 DD. Severe diffuse  hypokinesis with distinct regional wall motion abnormalities.   There is disproportionately severe hypokinesis and thinning of the apex, anterior septum and anterior wall (septal dyssynchrony). However, there is severe dyssynchrony , making it difficult to assess regional function.  PA P ~53 mmHg   TRANSTHORACIC ECHOCARDIOGRAM  01/2017   Severely reduced EF.  GI to DD.  Moderate MR.  Mild to have a moderately elevated  PAP (peak 33mmHg)   TRANSTHORACIC ECHOCARDIOGRAM  11/2018    EF 15-20%.  Mild dilation.  Global HK.  Moderate RV enlargement with mildly reduced function.  Severe biatrial enlargement.  Moderate pleural effusion.  Moderate MAP:.  Moderate aortic sclerosis.  Mild ascending aortic dilation-41 mm.  Increased CVP estimated 15 mmHg.       Family History  Problem Relation Age of Onset   Diabetes Mother    Heart disease Paternal Grandmother    Heart disease Paternal Grandfather     Social History   Tobacco Use   Smoking status: Never   Smokeless tobacco: Never  Vaping Use   Vaping Use: Never used  Substance Use Topics   Alcohol use: Yes    Alcohol/week: 3.0 - 4.0 standard drinks    Types: 3 - 4 Shots of liquor per week    Comment: 1-1/2 glasses of scotch daily   Drug use: No    Home Medications Prior to Admission medications   Medication Sig Start Date End Date Taking? Authorizing Provider  acetaminophen (TYLENOL) 325 MG tablet Take 2 tablets (650 mg total) by mouth every 4 (four) hours as needed for headache or mild pain. 12/30/18  Yes Angiulli, Lavon Paganini, PA-C  amiodarone (PACERONE) 200 MG tablet Take 0.5 tablets (100 mg total) by mouth daily. Patient taking differently: Take 100 mg by mouth daily after breakfast. 08/28/20  Yes Leonie Man, MD  atorvastatin (LIPITOR) 80 MG tablet Take 1 tablet (80 mg total) by mouth daily. TAKE 1 TABLET BY MOUTH DAILY AT 6:00 PM. Patient taking differently: Take 80 mg by mouth daily after breakfast. 02/12/20  Yes Leonie Man, MD  BRILINTA 60 MG TABS tablet TAKE 1 TABLET(60 MG) BY MOUTH TWICE DAILY Patient taking differently: Take 60 mg by mouth 2 (two) times daily. 06/24/20  Yes Leonie Man, MD  Cholecalciferol (VITAMIN D) 50 MCG (2000 UT) CAPS Take 0.0005 capsules (1 Units total) by mouth daily. Patient taking differently: Take 2,000 Units by mouth daily after breakfast. 12/30/18  Yes Angiulli, Lavon Paganini, PA-C  Coenzyme Q10 (COQ10) 200  MG CAPS Take 200 mg by mouth daily after breakfast.   Yes [provider]  digoxin (LANOXIN) 0.125 MG tablet Take 1 tablet (125 mcg total) by mouth daily. Patient taking differently: Take 125 mcg by mouth daily after breakfast. 01/05/20  Yes Leonie Man, MD  empagliflozin (JARDIANCE) 10 MG TABS tablet Take 1 tablet (10 mg total) by mouth daily. Patient taking differently: Take 10 mg by mouth daily after breakfast. 08/28/20  Yes Leonie Man, MD  ezetimibe (ZETIA) 10 MG tablet Take 1  tablet (10 mg total) by mouth daily. Patient taking differently: Take 10 mg by mouth daily after breakfast. 10/09/19 08/31/20 Yes Leonie Man, MD  levothyroxine (SYNTHROID) 125 MCG tablet TAKE 1 TABLET BY MOUTH EVERY DAY Patient taking differently: Take 125 mcg by mouth daily after breakfast. 07/23/20  Yes Henson, Vickie L, NP-C  Multiple Vitamins-Minerals (MULTIVITAMIN ADULT EXTRA C PO) Take 1 tablet by mouth daily after breakfast. 05/26/19  Yes [provider]  Nutritional Supplements (NUTRITIONAL SUPPLEMENT PO) Take 1 tablet by mouth daily.   Yes [provider]  sacubitril-valsartan (ENTRESTO) 24-26 MG TAKE 1 TABLET BY MOUTH TWICE DAILY Patient taking differently: Take 1 tablet by mouth in the morning and at bedtime. 07/24/20  Yes Leonie Man, MD  spironolactone (ALDACTONE) 25 MG tablet Take 0.5 tablets (12.5 mg total) by mouth daily. Patient taking differently: Take 12.5 mg by mouth daily after breakfast. 08/28/20  Yes Leonie Man, MD  torsemide (DEMADEX) 20 MG tablet TAKE 1 TABLET(20 MG) BY MOUTH DAILY Patient taking differently: Take 20 mg by mouth daily after breakfast. 06/03/20  Yes Deboraha Sprang, MD  venlafaxine (EFFEXOR) 75 MG tablet TAKE 1 TABLET(75 MG) BY MOUTH DAILY Patient taking differently: Take 75 mg by mouth daily after breakfast. 07/24/20  Yes Henson, Vickie L, NP-C  ondansetron (ZOFRAN) 4 MG tablet Take 1 tablet (4 mg total) by mouth every 8 (eight) hours as  needed for nausea or vomiting. 08/28/20   Leonie Man, MD    Allergies    Clams [shellfish allergy], Shellfish allergy, Shellfish-derived products, Sulfa antibiotics, Other, and Sulfa antibiotics  Review of Systems   Review of Systems  Constitutional:  Negative for activity change, appetite change, chills, fatigue and fever.  HENT:  Negative for ear pain and sore throat.   Eyes:  Negative for pain and visual disturbance.  Respiratory:  Negative for cough and shortness of breath.   Cardiovascular:  Negative for chest pain and palpitations.  Gastrointestinal:  Negative for abdominal pain, diarrhea, nausea and vomiting.  Genitourinary:  Negative for dysuria and hematuria.  Musculoskeletal:  Negative for arthralgias, back pain, joint swelling, myalgias and neck pain.  Skin:  Negative for color change, rash and wound.  Neurological:  Negative for dizziness, seizures, syncope, weakness, light-headedness, numbness and headaches.  Hematological:  Does not bruise/bleed easily.  Psychiatric/Behavioral:  Negative for confusion and decreased concentration.   All other systems reviewed and are negative.  Physical Exam Updated Vital Signs BP 100/70   Pulse 63   Temp 97.6 F (36.4 C)   Resp 16   Ht 6\' 1"  (1.854 m)   Wt 102.1 kg   SpO2 98%   BMI 29.69 kg/m   Physical Exam Vitals and nursing note reviewed.  Constitutional:      General: He is not in acute distress.    Appearance: He is well-developed. He is ill-appearing (Chronically). He is not toxic-appearing or diaphoretic.  HENT:     Head: Normocephalic and atraumatic.     Right Ear: External ear normal.     Left Ear: External ear normal.     Nose: Nose normal.     Mouth/Throat:     Mouth: Mucous membranes are moist.     Pharynx: Oropharynx is clear.  Eyes:     Extraocular Movements: Extraocular movements intact.     Conjunctiva/sclera: Conjunctivae normal.  Cardiovascular:     Rate and Rhythm: Normal rate.     Heart  sounds: No murmur heard. Pulmonary:  Effort: Pulmonary effort is normal. No respiratory distress.     Breath sounds: Normal breath sounds. No wheezing or rales.  Chest:     Chest wall: No tenderness.  Abdominal:     Palpations: Abdomen is soft.     Tenderness: There is no abdominal tenderness. There is no guarding.  Musculoskeletal:        General: No swelling, tenderness, deformity or signs of injury.     Cervical back: Neck supple. No tenderness.  Skin:    General: Skin is warm and dry.  Neurological:     General: No focal deficit present.     Mental Status: He is alert and oriented to person, place, and time.     Cranial Nerves: No cranial nerve deficit.     Sensory: No sensory deficit.     Motor: No weakness.     Coordination: Coordination normal.  Psychiatric:        Mood and Affect: Mood normal.        Behavior: Behavior normal.        Thought Content: Thought content normal.        Judgment: Judgment normal.    ED Results / Procedures / Treatments   Labs (all labs ordered are listed, but only abnormal results are displayed) Labs Reviewed  DIGOXIN LEVEL - Abnormal; Notable for the following components:      Result Value   Digoxin Level 2.9 (*)    All other components within normal limits  TSH - Abnormal; Notable for the following components:   TSH 5.767 (*)    All other components within normal limits  T4, FREE - Abnormal; Notable for the following components:   Free T4 1.47 (*)    All other components within normal limits  CBC WITH DIFFERENTIAL/PLATELET - Abnormal; Notable for the following components:   WBC 15.8 (*)    RDW 17.2 (*)    Neutro Abs 13.6 (*)    Abs Immature Granulocytes 0.08 (*)    All other components within normal limits  COMPREHENSIVE METABOLIC PANEL - Abnormal; Notable for the following components:   Sodium 130 (*)    CO2 18 (*)    Glucose, Bld 57 (*)    BUN 65 (*)    Creatinine, Ser 3.85 (*)    Total Protein 6.2 (*)    AST 42 (*)     Total Bilirubin 2.2 (*)    GFR, Estimated 15 (*)    All other components within normal limits  BRAIN NATRIURETIC PEPTIDE - Abnormal; Notable for the following components:   B Natriuretic Peptide 4,467.9 (*)    All other components within normal limits  I-STAT CHEM 8, ED - Abnormal; Notable for the following components:   Sodium 131 (*)    BUN 60 (*)    Creatinine, Ser 3.70 (*)    Glucose, Bld 51 (*)    Calcium, Ion 1.05 (*)    TCO2 21 (*)    All other components within normal limits  TROPONIN I (HIGH SENSITIVITY) - Abnormal; Notable for the following components:   Troponin I (High Sensitivity) 75 (*)    All other components within normal limits  TROPONIN I (HIGH SENSITIVITY) - Abnormal; Notable for the following components:   Troponin I (High Sensitivity) 65 (*)    All other components within normal limits  MAGNESIUM    EKG EKG Interpretation  Date/Time:  Saturday August 31 2020 12:44:01 EDT Ventricular Rate:  64 PR Interval:  286 QRS Duration:  192 QT Interval:  486 QTC Calculation: 502 R Axis:   -73 Text Interpretation: Sinus or ectopic atrial rhythm Prolonged PR interval Nonspecific IVCD with LAD LVH with secondary repolarization abnormality Inferior infarct, old Anterior infarct, old Confirmed by Godfrey Pick [phone]) on 08/31/2020 1:01:01 PM  Radiology DG Chest Portable 1 View  Result Date: 08/31/2020 CLINICAL DATA:  Hypotension and fall. EXAM: PORTABLE CHEST 1 VIEW COMPARISON:  Chest radiograph dated 12/17/2018. FINDINGS: The heart is enlarged. Vascular calcifications are seen in the aortic arch. Mild left basilar atelectasis/airspace disease is noted. A small left pleural effusion may contribute. The right lung is clear without pleural effusion. There is no pneumothorax. A left subclavian approach cardiac device is redemonstrated. Degenerative changes are seen in the spine. IMPRESSION: Mild left basilar atelectasis/airspace disease. A small left pleural effusion may contribute.  Aortic Atherosclerosis (ICD10-I70.0). Electronically Signed   By: Zerita Boers M.D.   On: 08/31/2020 13:58    Procedures Procedures   Medications Ordered in ED Medications  acetaminophen (TYLENOL) tablet 650 mg (has no administration in time range)  levothyroxine (SYNTHROID) tablet 125 mcg (has no administration in time range)  ticagrelor (BRILINTA) tablet 60 mg (has no administration in time range)  cholecalciferol (VITAMIN D3) tablet 1,000 Units (has no administration in time range)  ezetimibe (ZETIA) tablet 10 mg (has no administration in time range)  atorvastatin (LIPITOR) tablet 80 mg (has no administration in time range)  heparin injection 5,000 Units (has no administration in time range)  multivitamin with minerals tablet 1 tablet (has no administration in time range)  thiamine tablet 100 mg (has no administration in time range)  folic acid (FOLVITE) tablet 1 mg (has no administration in time range)  lactated ringers bolus 250 mL (0 mLs Intravenous Stopped 08/31/20 1520)  0.9 %  sodium chloride infusion (1,000 mLs Intravenous Not Given 08/31/20 1512)  etomidate (AMIDATE) injection (20 mg Intravenous Not Given 08/31/20 1513)  rocuronium (ZEMURON) injection (70 mg Intravenous Not Given 08/31/20 1514)  fentaNYL (SUBLIMAZE) injection (50 mcg Intravenous Given 08/31/20 0520)    ED Course  I have reviewed the triage vital signs and the nursing notes.  Pertinent labs & imaging results that were available during my care of the patient were reviewed by me and considered in my medical decision making (see chart for details).    MDM Rules/Calculators/A&P                           Patient presents following 2 falls at home.  EMS was called by his wife.  When they arrived on scene, they found to be hypotensive and bradycardic.  Patient arrives in the ED awake and alert.  He swears that he did not hurt himself during these falls.  He states without a doubt that he did not strike his head.  He  states that he remembers both entire episodes.  EMS reports that he had a heart rate in the high 40s on scene.  On arrival in the ED, heart rate is in the 60s.  EKG shows no changes from prior EKGs.  Patient's blood pressure is in the low-normal range.  Per chart review, patient does typically have low blood pressure and a low heart rate.  He was seen by his cardiology 3 days ago.  At the time, he did endorse fatigue.  Today, he denies any symptoms.  I do suspect he is being stoic about things.  Laboratory work-up was initiated.  Results showed KIA with elevated BUN suggestive of prerenal etiology.  Patient's digoxin level was supratherapeutic at 2.9.  Chest x-ray did not show sniffing and pulmonary edema.  Patient saturating well on room air.  Gentle rehydration was given for treatment of AKI as well as dilution of digoxin.  250 cc of LR ordered initially.  Following this dose, 250 cc of NS given.  Patient was agreeable to admission.  He was admitted to hospitalist for further management.  Final Clinical Impression(s) / ED Diagnoses Final diagnoses:  AKI (acute kidney injury) [facility])    Rx / [place] Orders ED Discharge Orders     None        Godfrey Pick, MD 08/31/20 1700

## 2020-08-31 NOTE — ED Notes (Signed)
Patient refusing foley catheter at present stating he no longer has the sensation to pee.

## 2020-08-31 NOTE — H&P (Addendum)
History and Physical    Leonard [name] LEX:[id number] DOB: Nov 30, 1945 DOA: 08/31/2020  PCP: Girtha Rm, NP-C  Patient coming from: home   Chief Complaint: s/p fall x2  HPI: Leonard [name] is a 75 y.o. male with medical history significant of ischemic cardiomyopathy, chronic combined systolic and diastolic heart failure, status post STEMI, status post AICD, dyslipidemia, CAD, V. fib, chronic LBBB, CAD status post RCA PCI as well as PTCA on Brilinta presents after sustaining 2 falls today.  Early this a.m. when he woke up in the dark he turned on the light and somehow he tripped and fell.  He denies hitting his head.  Denies any dizziness or prodromes prior to falling.  Then the second time this afternoon he was not using his walker which he regularly needs to walk and tripped over the doorway and fell.  Again denies any syncope or presyncopal episodes, chest pain, shortness of breath or headaches.  EMS arrived on scene and found patient to be hypotensive and bradycardic.  They reported blood pressure of systolic of 44H and heart rate of 46. Patient was seen by his cardiologist 3 days ago and medications were adjusted.  His amiodarone was decreased to 100 daily due to nausea, fatigue, weight loss and worsening thyroid levels.  His Entresto is low-dose with parameters, and his Aldactone was decreased from 25 mg to 12.5.  On digoxin and his level here is 2.9.  He was also started on Demadex 20 mg daily with as needed dosing for worsening edema.  He denies chest pain, shortness of breath, dyspnea on exertion, fever or chills.   ED Course: In ED he was given IV fluid boluses.sbp 90-111/80.  Respiratory rate 21, afebrile, 98% on room air.  Creatinine increased to 3.85, sodium 130, potassium 5.0, calcium 9.2. BNP 4467.9 hemoglobin 14.3, hematocrit 46.1, platelets 309. TP 75>>65. Foley placed as pt stated he no longer has sensation to urinate. Foley bag full now  Chest x-ray personal review  mild left atelectasis, small left pleural effusion.  Aortic atherosclerosis. EKG personally reviewed sinus,IVCD   Review of Systems: All systems reviewed and otherwise negative.    Past Medical History:  Diagnosis Date   AICD (automatic cardioverter/defibrillator) present 08/26/2015   STJ   Anxiety    Atherosclerotic heart disease of native coronary artery with angina pectoris ([place]) 07/2015   a. 07/2015 Posterior STEMI/PCI: LM nl, LAd 40ost, RI 40, RCA 156m (3.0x22 Resolute Integrity DES distal, 3.0x12 Resolute Integrity DES prox), 90d (PTCA), RPDA small, nl, RPLB2 100 - too small for PTCA. -- patent in f/u cath 08/2015 & 05/2016 - patent RCA stents (~10%), RI 40%, LM ~30% ost LAD ~40%.   Chronic combined systolic and diastolic CHF (congestive heart failure) ([place]) 07/2015   a. 07/2015 Ehco: EF 45-50%, basal-mid inf and infsept AK, inflat, apical inf, and apical septal HK, Gr1 DD, triv MR.;; b - Echo 05/2016 -> EF 20-25%, Gr 2 DD.  Severe diffuse HK disproportionately severe HK and thinning of apex, anterior septum and anterior wall-with septal dyssynchrony.  PAP ~53 mmHg.   Hyperlipidemia with target low density lipoprotein (LDL) cholesterol less than 70 mg/dL    Hypertensive heart disease    Ischemic cardiomyopathy 07/2015 - 05/2016   a. 07/2015 Echo: EF 45-50%.; Follow-up Echo 08/23/2015: EF 50-55% with mild inferior hypokinesis --> 05/2016: EF 20-25%, diffuse HK. PAP ~53 mmHg.   Kidney disease, chronic, stage [name] (GFR 30-59 ml/min) (HCC) 08/12/2015   Morbid obesity ([place])  ST elevation myocardial infarction (STEMI) of inferior wall ([place]) 08/12/2015   Subacute presentation for inferior MI with post infarction angina on the following day. EKG still shows injury current. Therefore decided to proceed with intervention of 100% mRCA thrombotic lesion.   Sustained ventricular fibrillation ([place]) 08/22/2015   Underwent EP evaluation with final ICD implantation.    Past Surgical History:  Procedure  Laterality Date   CARDIAC CATHETERIZATION N/A 08/12/2015   Procedure: Left Heart Cath and Coronary Angiography;  Surgeon: Leonie Man, MD;  Location: [place];  Service: Cardiovascular: Inferior STEMI: 100% mid RCA, 90% distal RCA, 100% RPL. 40% ostial and proximal LAD and branch of ramus.   CARDIAC CATHETERIZATION N/A 08/26/2015   Procedure: Left Heart Cath and Coronary Angiography;  Surgeon: Belva Crome, MD;  Location: [facility];  Service: Cardiovascular: To evaluate sustained VT. Widely patent RCA stent and distal PTCA site. Also patent RPL branch. LAD lesion appeared to be more consistent with 50-55% and 40%. Otherwise stable from previous cath. EF estimated 35-45%.   CARDIOVERSION N/A 08/23/2015   Procedure: CARDIOVERSION;  Surgeon: Deboraha Sprang, MD;  Location: [facility];  Service: Cardiovascular;  Laterality: N/A;   CORONARY ANGIOPLASTY WITH STENT PLACEMENT  08/12/2015   Mid RCA 100% reduced to 0% with 2 overlapping Resolute DES.  3.0 x 22 mm with a 3.0 x 12 mm prox overlarp (postdilated to 3.4 mm); PTCA of dRCA 90%. (Very dificult,complex case - tortuous, [place] RCA - unable to advance longer stents.  RPL2 noted to have thromboembolic 732% occlusion - unable to reach.   ELECTROPHYSIOLOGIC STUDY N/A 08/26/2015   Procedure: Electrophysiology Study;  Surgeon: Deboraha Sprang, MD;  Location: [place] [facility];  Service: Cardiovascular;  Laterality: N/A;   EP IMPLANTABLE DEVICE N/A 08/26/2015   Procedure: ICD Implant;  Surgeon: Deboraha Sprang, MD;  Location: [place];  Service: Cardiovascular;  Laterality: LEFT:  St Jude ICD, serial number [id number].    HEMORRHOID SURGERY     LEA DOPPLERS  06/2019   Lower Extremity Arterial Doppler: Right-mild lower extremity arterial disease with abnormal TBI.  Moderate Left PAD-abnormal TBI.   Lower Extr V Dopplers  04/2019   Lower Venous Reflux Study.:  No evidence of obvious DVT bilaterally.  Reflux noted in Right CFV, GSV in  the thigh as well as calf, as well as SSV.  Venous reflux noted in the Left GSV in the thigh, but no evidence in SSV or CFV.   RIGHT/LEFT HEART CATH AND CORONARY ANGIOGRAPHY N/A 06/10/2016   Procedure: Right/Left Heart Cath and Coronary Angiography;  Surgeon: Martinique, Peter M, MD;  [facility]: [facility]:  Nonobstructive CAD. RCA stents PATENT.  Ost-Prox RCA, 10 %. Ost LM 30 %. Ost LAD 40 %. RI, 40 %; High PCWP  & LVEDP (23 mmHg) ; PAP 43/23/31 mmHg. CO/CI 3.89 / 1/5 (Severely Reduced)   SKIN SURGERY     TRANSTHORACIC ECHOCARDIOGRAM  08/13/2015; 08/23/2015   a. Mild concentric LVH. EF 45-50%. Basal-mid inferior and inferoseptal akinesis with hypokinesis of inferolateral and apical inferior wall. 1 DD. ;; b. Technically difficult study. Definity contrast administered.  Compared to a prior echo in 07/2015, the LVEF is slightly higher at 50-55% with mild inferior hypokinesis.   TRANSTHORACIC ECHOCARDIOGRAM  06/09/2016   EF  20-25%, GR 2 DD. Severe diffuse  hypokinesis with distinct regional wall motion abnormalities.   There is disproportionately severe hypokinesis and thinning of the  apex, anterior septum and anterior wall (septal dyssynchrony). However, there is severe dyssynchrony , making it difficult to assess regional function.  PA P ~53 mmHg   TRANSTHORACIC ECHOCARDIOGRAM  01/2017   Severely reduced EF.  GI to DD.  Moderate MR.  Mild to have a moderately elevated PAP (peak 92mmHg)   TRANSTHORACIC ECHOCARDIOGRAM  11/2018    EF 15-20%.  Mild dilation.  Global HK.  Moderate RV enlargement with mildly reduced function.  Severe biatrial enlargement.  Moderate pleural effusion.  Moderate MAP:.  Moderate aortic sclerosis.  Mild ascending aortic dilation-41 mm.  Increased CVP estimated 15 mmHg.     reports that he has never smoked. He has never used smokeless tobacco. He reports that he does not currently use alcohol after a past usage of about 3.0 - 4.0 standard drinks per week. He reports that he does  not use drugs.  Allergies  Allergen Reactions   Clams [Shellfish Allergy] Shortness Of Breath, Itching and Swelling   Shellfish Allergy Anaphylaxis   Shellfish-Derived Products Shortness Of Breath, Itching and Swelling   Sulfa Antibiotics Other (See Comments)    unknown   Other Itching and Other (See Comments)    Ragweed causes watery eyes, itching, sneezing   Sulfa Antibiotics Other (See Comments)    Pt unsure of reaction, mother told him he was allergic     Family History  Problem Relation Age of Onset   Diabetes Mother    Heart disease Paternal Grandmother    Heart disease Paternal Grandfather      Prior to Admission medications   Medication Sig Start Date End Date Taking? Authorizing Provider  acetaminophen (TYLENOL) 325 MG tablet Take 2 tablets (650 mg total) by mouth every 4 (four) hours as needed for headache or mild pain. 12/30/18   Angiulli, Lavon Paganini, PA-C  amiodarone (PACERONE) 200 MG tablet Take 0.5 tablets (100 mg total) by mouth daily. 08/28/20   Leonie Man, MD  atorvastatin (LIPITOR) 80 MG tablet Take 1 tablet (80 mg total) by mouth daily. TAKE 1 TABLET BY MOUTH DAILY AT 6:00 PM. 02/12/20   Leonie Man, MD  BRILINTA 60 MG TABS tablet TAKE 1 TABLET(60 MG) BY MOUTH TWICE DAILY 06/24/20   Leonie Man, MD  Cholecalciferol (VITAMIN D) 50 MCG (2000 UT) CAPS Take 0.0005 capsules (1 Units total) by mouth daily. 12/30/18   Angiulli, Lavon Paganini, PA-C  Coenzyme Q10 (COQ10) 200 MG CAPS Take 200 mg by mouth daily.    [provider]  digoxin (LANOXIN) 0.125 MG tablet Take 1 tablet (125 mcg total) by mouth daily. 01/05/20   Leonie Man, MD  empagliflozin (JARDIANCE) 10 MG TABS tablet Take 1 tablet (10 mg total) by mouth daily. 08/28/20   Leonie Man, MD  ezetimibe (ZETIA) 10 MG tablet Take 1 tablet (10 mg total) by mouth daily. 10/09/19 08/28/20  Leonie Man, MD  levothyroxine (SYNTHROID) 125 MCG tablet TAKE 1 TABLET BY MOUTH EVERY DAY 07/23/20   Henson,  Vickie L, NP-C  Multiple Vitamins-Minerals (MULTIVITAMIN ADULT EXTRA C PO) TAKE 1 TABLET BY MOUTH DAILY 05/26/19   [provider]  ondansetron (ZOFRAN) 4 MG tablet Take 1 tablet (4 mg total) by mouth every 8 (eight) hours as needed for nausea or vomiting. 08/28/20   Leonie Man, MD  sacubitril-valsartan (ENTRESTO) 24-26 MG TAKE 1 TABLET BY MOUTH TWICE DAILY 07/24/20   Leonie Man, MD  spironolactone (ALDACTONE) 25 MG tablet Take 0.5 tablets (12.5  mg total) by mouth daily. 08/28/20   Leonie Man, MD  torsemide (DEMADEX) 20 MG tablet TAKE 1 TABLET(20 MG) BY MOUTH DAILY 06/03/20   Deboraha Sprang, MD  venlafaxine [facility]) 75 MG tablet TAKE 1 TABLET(75 MG) BY MOUTH DAILY 07/24/20   Girtha Rm, NP-C    Physical Exam: Vitals:   08/31/20 1300 08/31/20 1440 08/31/20 1500 08/31/20 1530  BP: 111/80 (!) 121/104 106/89 100/70  Pulse: 64 65 65 64  Resp: (!) 21 (!) 23 (!) 23 13  Temp:      SpO2: 98% 97% 97% 93%  Weight:      Height:        Constitutional: NAD, calm, comfortable Vitals:   08/31/20 1300 08/31/20 1440 08/31/20 1500 08/31/20 1530  BP: 111/80 (!) 121/104 106/89 100/70  Pulse: 64 65 65 64  Resp: (!) 21 (!) 23 (!) 23 13  Temp:      SpO2: 98% 97% 97% 93%  Weight:      Height:       Eyes: EOMI ENMT: MMM Neck: normal, supple, no masses. No JVD Respiratory: clear to auscultation bilaterally, no wheezing, no crackles. Normal respiratory effort. No accessory muscle use.  Cardiovascular: Regular rate and rhythm, distant heart sounds, no gallops or murmurs Abdomen: no tenderness, no masses palpated. No Bowel sounds positive. Under the fold of abdominal skin, with red rash (per pt chornic) Musculoskeletal: chronic venous stasis/skin changes. +LE edema b/l Skin: Warm dry Neurologic: CN 2-12 grossly intact.  Psychiatric: Normal judgment and insight. Alert and oriented x 3. Normal mood.    Labs on Admission: I have personally reviewed following labs and imaging  studies  CBC: Recent Labs  Lab 08/31/20 1310 08/31/20 1354  WBC 15.8*  --   NEUTROABS 13.6*  --   HGB 14.3 15.6  HCT 46.1 46.0  MCV 86.5  --   PLT 309  --    Basic Metabolic Panel: Recent Labs  Lab 08/31/20 1310 08/31/20 1354  NA 130* 131*  K 5.0 4.9  CL 98 101  CO2 18*  --   GLUCOSE 57* 51*  BUN 65* 60*  CREATININE 3.85* 3.70*  CALCIUM 9.2  --   MG 2.4  --    GFR: Estimated Creatinine Clearance: 21 mL/min (A) (by C-G formula based on SCr of 3.7 mg/dL (H)). Liver Function Tests: Recent Labs  Lab 08/31/20 1310  AST 42*  ALT 29  ALKPHOS 90  BILITOT 2.2*  PROT 6.2*  ALBUMIN 3.7   No results for input(s): LIPASE, AMYLASE in the last 168 hours. No results for input(s): AMMONIA in the last 168 hours. Coagulation Profile: No results for input(s): INR, PROTIME in the last 168 hours. Cardiac Enzymes: No results for input(s): CKTOTAL, CKMB, CKMBINDEX, TROPONINI in the last 168 hours. BNP (last 3 results) No results for input(s): PROBNP in the last 8760 hours. HbA1C: No results for input(s): HGBA1C in the last 72 hours. CBG: No results for input(s): GLUCAP in the last 168 hours. Lipid Profile: No results for input(s): CHOL, HDL, LDLCALC, TRIG, CHOLHDL, LDLDIRECT in the last 72 hours. Thyroid Function Tests: Recent Labs    08/31/20 1310  TSH 5.767*  FREET4 1.47*   Anemia Panel: No results for input(s): VITAMINB12, FOLATE, FERRITIN, TIBC, IRON, RETICCTPCT in the last 72 hours. Urine analysis:    Component Value Date/Time   COLORURINE YELLOW 05/22/2019 0050   APPEARANCEUR CLEAR 05/22/2019 0050   LABSPEC 1.005 05/22/2019 0050   PHURINE 6.0 05/22/2019  [place] 05/22/2019 0050   HGBUR NEGATIVE 05/22/2019 0050   BILIRUBINUR NEGATIVE 05/22/2019 0050   BILIRUBINUR n 04/18/2015 1106   [place] 05/22/2019 0050   PROTEINUR NEGATIVE 05/22/2019 0050   UROBILINOGEN negative 04/18/2015 1106   NITRITE NEGATIVE 05/22/2019 0050    LEUKOCYTESUR NEGATIVE 05/22/2019 0050    Radiological Exams on Admission: DG Chest Portable 1 View  Result Date: 08/31/2020 CLINICAL DATA:  Hypotension and fall. EXAM: PORTABLE CHEST 1 VIEW COMPARISON:  Chest radiograph dated 12/17/2018. FINDINGS: The heart is enlarged. Vascular calcifications are seen in the aortic arch. Mild left basilar atelectasis/airspace disease is noted. A small left pleural effusion may contribute. The right lung is clear without pleural effusion. There is no pneumothorax. A left subclavian approach cardiac device is redemonstrated. Degenerative changes are seen in the spine. IMPRESSION: Mild left basilar atelectasis/airspace disease. A small left pleural effusion may contribute. Aortic Atherosclerosis (ICD10-I70.0). Electronically Signed   By: Zerita Boers M.D.   On: 08/31/2020 13:58      Assessment/Plan Active Problems:   Hypotension    S/p mechanical fall -x2. Possible due to weakness from low bp/HR Place on telemetry Hold cardiac meds PT OT Hold cardiac meds as bp low Given ivf in ER Hold amiodarone and digoxin was very bradycardic  2. Chronic diastolic and systolic heart failure/ischemic cardiomyopathy: Patient denies any cp/sob/doe BNP however elevated, but very low bp. Likely may need ionotrope at some point Last echo EF 15-20% from 2021 Will obtain echo For now will hold cardiac meds. Dc ivf I/o  Daily wt Cardiology consulted Hold amiodarone and digoxin.  Digoxin level mildly elevated.  Check a.m. levels.  Holding this due to bradycardia  3.CAD with hx/o PCI Continue Brilinta, statin, Zetia  4.Acute on CKD IIIB Worsening renal function >>>will hold cardiac meds /aldactone since bp on presentation very low IVF bolus given in ED Will try to avoid ivf, encouraged hydration Avoid nephrotoxic meds Foley placed in ED Obtain renal ultrasound   5.  Elevated troponin- EKG is not very ischemic Likely demand ischemia from fall and AKI Denies  any anginal angina-like symptoms  DVT prophylaxis: Heparin Code Status: Full-confirmed by patient Family Communication: None at bedside Disposition Plan: Discharged home Consults called: Cardiology Admission status: Inpatient as patient requires more than 2 midnight stays due to severity of illness   [name] MD [facility]   If 7PM-7AM, please contact night-coverage [email or web address] Password [facility]  08/31/2020, 4:01 PM

## 2020-09-01 ENCOUNTER — Inpatient Hospital Stay (HOSPITAL_COMMUNITY): Payer: Medicare Other

## 2020-09-01 DIAGNOSIS — I9589 Other hypotension: Secondary | ICD-10-CM | POA: Diagnosis not present

## 2020-09-01 DIAGNOSIS — I5023 Acute on chronic systolic (congestive) heart failure: Secondary | ICD-10-CM | POA: Diagnosis not present

## 2020-09-01 DIAGNOSIS — R001 Bradycardia, unspecified: Secondary | ICD-10-CM | POA: Diagnosis present

## 2020-09-01 DIAGNOSIS — R296 Repeated falls: Secondary | ICD-10-CM

## 2020-09-01 DIAGNOSIS — I5021 Acute systolic (congestive) heart failure: Secondary | ICD-10-CM | POA: Diagnosis not present

## 2020-09-01 DIAGNOSIS — L304 Erythema intertrigo: Secondary | ICD-10-CM | POA: Diagnosis present

## 2020-09-01 LAB — BASIC METABOLIC PANEL
Anion gap: 12 (ref 5–15)
BUN: 59 mg/dL — ABNORMAL HIGH (ref 8–23)
CO2: 21 mmol/L — ABNORMAL LOW (ref 22–32)
Calcium: 9 mg/dL (ref 8.9–10.3)
Chloride: 101 mmol/L (ref 98–111)
Creatinine, Ser: 3.1 mg/dL — ABNORMAL HIGH (ref 0.61–1.24)
GFR, Estimated: 20 mL/min — ABNORMAL LOW (ref >60)
Glucose, Bld: 66 mg/dL — ABNORMAL LOW (ref 70–99)
Potassium: 4.9 mmol/L (ref 3.5–5.1)
Sodium: 134 mmol/L — ABNORMAL LOW (ref 135–145)

## 2020-09-01 LAB — CBC
HCT: 41.8 % (ref 39.0–52.0)
Hemoglobin: 13.4 g/dL (ref 13.0–17.0)
MCH: 27.3 pg (ref 26.0–34.0)
MCHC: 32.1 g/dL (ref 30.0–36.0)
MCV: 85.3 fL (ref 80.0–100.0)
Platelets: 283 10*3/uL (ref 150–400)
RBC: 4.9 MIL/uL (ref 4.22–5.81)
RDW: 17.1 % — ABNORMAL HIGH (ref 11.5–15.5)
WBC: 15.8 10*3/uL — ABNORMAL HIGH (ref 4.0–10.5)
nRBC: 0 % (ref 0.0–0.2)

## 2020-09-01 LAB — SARS CORONAVIRUS 2 (TAT 6-24 HRS): SARS Coronavirus 2: NEGATIVE

## 2020-09-01 LAB — ECHOCARDIOGRAM COMPLETE
Height: 73 in
MV M vel: 4.4 m/s
MV Peak grad: 77.4 mmHg
Radius: 0.4 cm
S' Lateral: 5.5 cm
Single Plane A4C EF: 22.7 %
Weight: 3600 oz

## 2020-09-01 LAB — GLUCOSE, CAPILLARY: Glucose-Capillary: 134 mg/dL — ABNORMAL HIGH (ref 70–99)

## 2020-09-01 LAB — PROCALCITONIN: Procalcitonin: 0.12 ng/mL

## 2020-09-01 MED ORDER — CLOTRIMAZOLE 1 % EX CREA
TOPICAL_CREAM | Freq: Two times a day (BID) | CUTANEOUS | Status: DC
  Administered 2020-09-02: 1 via TOPICAL
  Filled 2020-09-01: qty 15

## 2020-09-01 MED ORDER — CHLORHEXIDINE GLUCONATE CLOTH 2 % EX PADS
6.0000 | MEDICATED_PAD | Freq: Every day | CUTANEOUS | Status: DC
  Administered 2020-09-01: 6 via TOPICAL

## 2020-09-01 NOTE — ED Notes (Signed)
Pt returned from ECHO.  Sats dropping down to 89% while sleeping.  Pt placed on 1 liter O2 via [place].

## 2020-09-01 NOTE — ED Notes (Signed)
Pt uncomfortable on ED stretcher.  Pt moved over to hospital bed for comfort.  States he feels much better.  Pt given pillow.  Call bell within reach.  Ice water given.

## 2020-09-01 NOTE — Progress Notes (Signed)
  Echocardiogram 2D Echocardiogram has been performed.  Leonard [name] 09/01/2020, 10:35 AM

## 2020-09-01 NOTE — ED Notes (Signed)
Breakfast Ordered 

## 2020-09-01 NOTE — Progress Notes (Signed)
Progress Note  Patient Name: Leonard [name] Date of Encounter: 09/01/2020  [facility] Cardiologist: Glenetta Hew, MD   Subjective   No CP or dyspnea  Inpatient Medications    Scheduled Meds:  atorvastatin  80 mg Oral Daily   cholecalciferol  1,000 Units Oral Daily   ezetimibe  10 mg Oral Daily   folic acid  1 mg Oral Daily   heparin  5,000 Units Subcutaneous Q8H   levothyroxine  125 mcg Oral Daily   multivitamin with minerals  1 tablet Oral Daily   thiamine  100 mg Oral Daily   ticagrelor  60 mg Oral BID   Continuous Infusions:  PRN Meds: acetaminophen   Vital Signs    Vitals:   09/01/20 0730 09/01/20 0800 09/01/20 0830 09/01/20 0845  BP: 112/68 (!) 108/55 111/76 103/66  Pulse:   65 61  Resp:  (!) 21 16 19   Temp:      SpO2: 99% 94% 91% 92%  Weight:      Height:        Intake/Output Summary (Last 24 hours) at 09/01/2020 0908 Last data filed at 09/01/2020 0630 Gross per 24 hour  Intake 187.5 ml  Output 1700 ml  Net -1512.5 ml   Last 3 Weights 08/31/2020 08/28/2020 05/29/2020  Weight (lbs) 225 lb 235 lb 242 lb 12.8 oz  Weight (kg) 102.059 kg 106.595 kg 110.133 kg      Telemetry    Sinus with first degree AV block, occasional Mobitz 1 second-degree AV block and occasional ventricular pacing- Personally Reviewed  Physical Exam   GEN: No acute distress.   Neck: No JVD Cardiac: RRR Respiratory: Clear to auscultation bilaterally. GI: Soft, nontender, non-distended  MS: Trace to 1+ ankle edema Neuro:  Nonfocal  Psych: Normal affect   Labs    High Sensitivity Troponin:   Recent Labs  Lab 08/31/20 1310 08/31/20 1430  TROPONINIHS 75* 65*      Chemistry Recent Labs  Lab 08/31/20 1310 08/31/20 1354  NA 130* 131*  K 5.0 4.9  CL 98 101  CO2 18*  --   GLUCOSE 57* 51*  BUN 65* 60*  CREATININE 3.85* 3.70*  CALCIUM 9.2  --   PROT 6.2*  --   ALBUMIN 3.7  --   AST 42*  --   ALT 29  --   ALKPHOS 90  --   BILITOT 2.2*  --   GFRNONAA  15*  --   ANIONGAP 14  --      Hematology Recent Labs  Lab 08/31/20 1310 08/31/20 1354  WBC 15.8*  --   RBC 5.33  --   HGB 14.3 15.6  HCT 46.1 46.0  MCV 86.5  --   MCH 26.8  --   MCHC 31.0  --   RDW 17.2*  --   PLT 309  --     BNP Recent Labs  Lab 08/31/20 1310  BNP 4,467.9*      Radiology    US RENAL  Result Date: 08/31/2020 CLINICAL DATA:  Acute kidney injury. EXAM: RENAL / URINARY TRACT ULTRASOUND COMPLETE COMPARISON:  None. FINDINGS: Right Kidney: Renal measurements: 9.3 x 5.1 x 4.1 cm = volume: 100.1 mL. Echogenicity within normal limits. No mass or hydronephrosis visualized. Left Kidney: Renal measurements: 9.2 x 4.6 x 3.4 cm = volume: 74 mL. Echogenicity within normal limits. No mass or hydronephrosis visualized. Bladder: Bladder is decompressed by Foley catheter. Other: None. IMPRESSION: No acute findings.  No hydronephrosis.  Electronically Signed   By: Franki Cabot M.D.   On: 08/31/2020 17:01   DG Chest Portable 1 View  Result Date: 08/31/2020 CLINICAL DATA:  Hypotension and fall. EXAM: PORTABLE CHEST 1 VIEW COMPARISON:  Chest radiograph dated 12/17/2018. FINDINGS: The heart is enlarged. Vascular calcifications are seen in the aortic arch. Mild left basilar atelectasis/airspace disease is noted. A small left pleural effusion may contribute. The right lung is clear without pleural effusion. There is no pneumothorax. A left subclavian approach cardiac device is redemonstrated. Degenerative changes are seen in the spine. IMPRESSION: Mild left basilar atelectasis/airspace disease. A small left pleural effusion may contribute. Aortic Atherosclerosis (ICD10-I70.0). Electronically Signed   By: Zerita Boers M.D.   On: 08/31/2020 13:58     Patient Profile     75 y.o. male with past medical history of coronary artery disease, mixed ischemic/nonischemic cardiomyopathy, prior ICD, chronic combined systolic/diastolic congestive heart failure, hypertension, hyperlipidemia,  chronic stage [name] kidney disease who is admitted after falling and noted to have hypotension.  Cardiology was asked to evaluate for hypotension and acute on chronic combined systolic/diastolic congestive heart failure. Cardiac catheterization May 2018 showed nonobstructive coronary disease with patent stent in the right coronary artery.  Last echocardiogram November 2020 showed ejection fraction 15 to 20%, mild left ventricular hypertrophy, mild left ventricular enlargement, mild RV dysfunction, moderate right ventricular enlargement, severe biatrial enlargement, mild mitral regurgitation, moderate tricuspid regurgitation and mildly dilated ascending aorta.   Assessment & Plan    Acute on chronic systolic congestive heart failure-patient presented with mechanical falls x2 and was noted to be hypotensive with acute renal insufficiency.  Blood pressure improved with gentle hydration.  Continue to hold all medications including Entresto, Jardiance, spironolactone, Demadex and digoxin.  As renal function and blood pressure stabilized can consider adding lower dose medications for his congestive heart failure at that time.   Elevated digoxin level-initial digoxin level was 2.9 likely due to acute on chronic renal insufficiency as well as amiodarone interaction.  He has remained asymptomatic and I have therefore not given Digibind.  I would not resume digoxin long-term. History of mixed ischemic/nonischemic cardiomyopathy-follow-up echocardiogram pending.  Cardiac medications on hold due to hypotension at time of admission and acute renal failure. Acute on chronic stage [name] kidney disease-creatinine remains elevated today.  Continue to hold cardiac medications.  Follow creatinine.  History of ventricular tachycardia-amiodarone on hold for now.  Will resume at discharge. Coronary artery disease-continue brilinta and statin. Minimally elevated troponin-no clear trend, no chest pain and occurring in the setting of  acute renal insufficiency.  No plans for further ischemia evaluation. S/P ICD  For questions or updates, please contact [place] Please consult [email or web address] for contact info under        Signed, Kirk Ruths, MD  09/01/2020, 9:08 AM

## 2020-09-01 NOTE — TOC Initial Note (Signed)
Transition of Care [facility]) - Initial/Assessment Note    Patient Details  Name: Leonard [name] MRN: [id number] Date of Birth: 1945-06-22  Transition of Care [facility]) CM/SW Contact:    Verdell Carmine, RN Phone Number: 09/01/2020, 1:23 PM  Clinical Narrative:                 75 year old male in with acute on chronic sys/dia heart failure. EF 20% by last echo, has a walker at home, however was not using it as normal when he fell. Patient was hypotensive and bradycardic when EMS arrived. Foley inserted as patient did not have urge to void, with large return.   While patient was taking brilinta, it is not on his med list, eligibility sent for continuation PT OT consults in for recommendations. CM will follow for needs and transitions.  Expected Discharge Plan: [place] Barriers to Discharge: Continued Medical Work up   Patient Goals and CMS Choice        Expected Discharge Plan and Services Expected Discharge Plan: [place]   Discharge Planning Services: CM Consult   Living arrangements for the past 2 months: Single Family Home                                      Prior Living Arrangements/Services Living arrangements for the past 2 months: Single Family Home Lives with:: Spouse Patient language and need for interpreter reviewed:: Yes        Need for Family Participation in Patient Care: Yes (Comment) Care giver support system in place?: Yes (comment)   Criminal Activity/Legal Involvement Pertinent to Current Situation/Hospitalization: No - Comment as needed  Activities of Daily Living      Permission Sought/Granted                  Emotional Assessment       Orientation: : Oriented to Self, Oriented to Place, Oriented to Situation Alcohol / Substance Use: Not Applicable Psych Involvement: No (comment)  Admission diagnosis:  Hypotension [I95.9] Patient Active Problem List   Diagnosis Date Noted   Hypothyroid  08/28/2020   Nausea in adult 08/28/2020   On amiodarone therapy 09/26/2019   Hypotension 05/21/2019   HFrEF (heart failure with reduced ejection fraction) ([place]) 05/21/2019   CKD (chronic kidney disease) stage 3, GFR 30-59 ml/min ([place]) 05/21/2019   VT (ventricular tachycardia) ([place]) 04/11/2019   Sinus tachycardia 04/11/2019   Debility 12/23/2018   LBBB (left bundle branch block)    Palliative care by specialist    DNR (do not resuscitate) discussion    Pressure injury of skin 12/18/2018   Generalized weakness 12/17/2018   Excessive drinking of alcohol 12/17/2018   Lower extremity cellulitis 12/17/2018   Depression 06/14/2018   Need for shingles vaccine 06/14/2018   S/P implantation of automatic cardioverter/defibrillator (AICD)    Grief 02/27/2016   Dilated cardiomyopathy ([place])    Obese    Chronic combined systolic and diastolic CHF (congestive heart failure) ([place])    Sustained ventricular fibrillation ([place]) 08/20/2015   Neutrophilia 08/14/2015   Elevated glucose 08/14/2015   ST elevation myocardial infarction (STEMI) of true posterior wall, subsequent episode of care ([place]) 08/12/2015   Coronary artery disease involving native coronary artery without angina pectoris 08/12/2015   Presence of drug coated stent in right coronary artery 08/12/2015   Essential hypertension 02/12/2015  Hyperlipidemia LDL goal <70 02/12/2015   Erectile dysfunction 02/12/2015   BPH (benign prostatic hyperplasia) 02/12/2015   PCP:  Girtha Rm, NP-C Pharmacy:   [facility] [place], [place] - [address] AT [address] [place] [address] Phone: [phone] Fax: [phone]  [facility] [place], [address] [address] Phone: [phone] Fax: [phone]     Social Determinants of Health (SDOH) Interventions    Readmission  Risk Interventions No flowsheet data found.

## 2020-09-01 NOTE — ED Notes (Signed)
Pt transported to ECHO. 

## 2020-09-01 NOTE — Progress Notes (Addendum)
PROGRESS NOTE    Leonard [name]   ZMO:[id number]  DOB: Mar 20, 1945  PCP: Girtha Rm, NP-C    DOA: 08/31/2020 LOS: 1   Assessment & Plan   Principal Problem:   Hypotension Active Problems:   Acute on chronic combined systolic and diastolic CHF (congestive heart failure) (HCC)   Bradycardia   Multiple falls   Essential hypertension   Hyperlipidemia LDL goal <70   Coronary artery disease involving native coronary artery without angina pectoris   Generalized weakness   CKD (chronic kidney disease) stage 3, GFR 30-59 ml/min (HCC)   Hypothyroid   Intertrigo   Mechanical fall -x2 -  likely related to hypotension and bradycardia with resulting generalized weakness. Pt also was not using his walker. --PT/OT evaluations pending --Hold cardiac meds as bp low --Hold amiodarone, digoxin due to bradycardia   Acute on Chronic combined systolic/diastolic CHF Ischemic cardiomyopathy - presenting w/hypotension and AKI. Status post ICD Hx of ventricular tachycardia --Cardiology following, appreciate recs --Hold Entresto, Jardiance, Aldactone, Demadex, digoxin --Resume meds cautiously as BP tolerates, per cards --Follow up Echo (last EF 15-20% in 2021) --Strict I/O's & daily weights  Digoxin Toxicity Bradycardia --hold digoxin and amiodarone --telemetry monitoring --cardiology recommending against resuming digoxin long term   CAD with hx/o PCI --Continue Brilinta, Lipitor, Zetia   Acute on CKD stage IIIB - AKI POA with Cr 3.85 --hold cardiac meds for AKI and low BP's --IVF bolus given in ED and Foley placed by EMS Cr improved to 3.10 (8/14) --Low EF, will encouraged PO hydration --IV fluids only if needed to maintain MAP>65 --Avoid nephrotoxic meds --Renal ultrasound negative for obstruction   Elevated troponin - unlikely ACS, trend flat, no chest pain or significant acute ischemic changes on EKG.  Likely demand ischemia  Prolonged Qtc - 502 ms on EKG.   --Avoid  QT prolonging meds --Telemetry --Maintain K>4, Mg>2  Candidal intertrigo - unable to give Diflucan with prolonged QT interval.   --Topical clotrimazole BID x 2-4 weeks --Monitor closely  Hypothyroidism - continue Synthroid  Vitamin d deficiency - continue supplement  HLD - continue Lipitor    Patient BMI: Body mass index is 29.69 kg/m.   DVT prophylaxis: heparin injection 5,000 Units Start: 08/31/20 1800 SCDs Start: 08/31/20 1557   Diet:  Diet Orders (From admission, onward)     Start     Ordered   08/31/20 1558  Diet 2 gram sodium Room service appropriate? Yes; Fluid consistency: Thin  Diet effective now       Question Answer Comment  Room service appropriate? Yes   Fluid consistency: Thin      08/31/20 1559              Code Status: Full Code   Brief Narrative / Hospital Course to Date:   Leonard [name] is a 75 y.o. male with medical history significant of ischemic cardiomyopathy, chronic combined systolic and diastolic heart failure, status post STEMI, status post AICD, dyslipidemia, CAD, V. fib, chronic LBBB, CAD status post RCA PCI as well as PTCA on Brilinta presents after sustaining 2 falls.  EMS was called and patient found to be hypotensive and bradycardic.  Also with urinary retention with Foley placed with large urine return.  BP improved with IV hydration. Has significant AKI with Cr 3.85 on admission. Cardiology is consulted and following.  Subjective 09/01/20    Patient seen in the ED holding for a bed today.  Wife at bedside.  Patient says he  is feeling better wife agrees he looks better today.  He denies any acute complaints.  Wife reports patient has a lot of trouble with "jock itch" and requests antifungal specifically Diflucan if no contraindications.  Wife reports having plans to have a home health nurse begin helping with his care soon.     Disposition Plan & Communication   Status is: Inpatient  Remains inpatient appropriate  because:Inpatient level of care appropriate due to severity of illness and ongoing diagnostic evaluation  Dispo: The patient is from: Home              Anticipated d/c is to: Home              Patient currently is not medically stable to d/c.   Difficult to place patient No   Family Communication: Wife at bedside on rounds   Consults, Procedures, Significant Events   Consultants:  Cardiology  Procedures:  None  Antimicrobials:  Anti-infectives (From admission, onward)    None         Micro    Objective   Vitals:   09/01/20 1030 09/01/20 1100 09/01/20 1413 09/01/20 1504  BP:  105/73 104/65 (!) 151/106  Pulse: 68 63 61 61  Resp: 20 17 (!) 25   Temp:   98.3 F (36.8 C) 98 F (36.7 C)  TempSrc:   Oral Oral  SpO2: 96% 97% 100% 93%  Weight:      Height:        Intake/Output Summary (Last 24 hours) at 09/01/2020 1758 Last data filed at 09/01/2020 0630 Gross per 24 hour  Intake --  Output 1700 ml  Net -1700 ml   Filed Weights   08/31/20 1254  Weight: 102.1 kg    Physical Exam:  General exam: awake, appears drowsy, no acute distress HEENT: moist mucus membranes, hearing grossly normal  Respiratory system: CTAB, with diminished bases, no wheezes, rales or rhonchi, normal respiratory effort. Cardiovascular system: normal S1/S2, RRR, pedal edema.L>R   Gastrointestinal system: soft, NT, ND,  Central nervous system: A&O. no gross focal neurologic deficits, normal speech Extremities: b/l toes and feet with erythema but no differential warmth, no edema, normal tone Skin: groin intertrigous intense erythema with satellite lesions consistent with candida Psychiatry: normal mood, congruent affect  Labs   Data Reviewed: I have personally reviewed following labs and imaging studies  CBC: Recent Labs  Lab 08/31/20 1310 08/31/20 1354 09/01/20 1529  WBC 15.8*  --  15.8*  NEUTROABS 13.6*  --   --   HGB 14.3 15.6 13.4  HCT 46.1 46.0 41.8  MCV 86.5  --  85.3   PLT 309  --  254   Basic Metabolic Panel: Recent Labs  Lab 08/31/20 1310 08/31/20 1354 09/01/20 1529  NA 130* 131* 134*  K 5.0 4.9 4.9  CL 98 101 101  CO2 18*  --  21*  GLUCOSE 57* 51* 66*  BUN 65* 60* 59*  CREATININE 3.85* 3.70* 3.10*  CALCIUM 9.2  --  9.0  MG 2.4  --   --    GFR: Estimated Creatinine Clearance: 25.1 mL/min (A) (by C-G formula based on SCr of 3.1 mg/dL (H)). Liver Function Tests: Recent Labs  Lab 08/31/20 1310  AST 42*  ALT 29  ALKPHOS 90  BILITOT 2.2*  PROT 6.2*  ALBUMIN 3.7   No results for input(s): LIPASE, AMYLASE in the last 168 hours. No results for input(s): AMMONIA in the last 168 hours. Coagulation Profile: No results  for input(s): INR, PROTIME in the last 168 hours. Cardiac Enzymes: No results for input(s): CKTOTAL, CKMB, CKMBINDEX, TROPONINI in the last 168 hours. BNP (last 3 results) No results for input(s): PROBNP in the last 8760 hours. HbA1C: No results for input(s): HGBA1C in the last 72 hours. CBG: No results for input(s): GLUCAP in the last 168 hours. Lipid Profile: No results for input(s): CHOL, HDL, LDLCALC, TRIG, CHOLHDL, LDLDIRECT in the last 72 hours. Thyroid Function Tests: Recent Labs    2020/09/08 1310  TSH 5.767*  FREET4 1.47*   Anemia Panel: No results for input(s): VITAMINB12, FOLATE, FERRITIN, TIBC, IRON, RETICCTPCT in the last 72 hours. Sepsis Labs: Recent Labs  Lab 09/01/20 1529  PROCALCITON 0.12    Recent Results (from the past 240 hour(s))  SARS CORONAVIRUS 2 (TAT 6-24 HRS) Nasopharyngeal Nasopharyngeal Swab     Status: None   Collection Time: 2020-09-08  7:22 PM   Specimen: Nasopharyngeal Swab  Result Value Ref Range Status   SARS Coronavirus 2 NEGATIVE NEGATIVE Final    Comment: (NOTE) SARS-CoV-2 target nucleic acids are NOT DETECTED.  The SARS-CoV-2 RNA is generally detectable in upper and lower respiratory specimens during the acute phase of infection. Negative results do not preclude  SARS-CoV-2 infection, do not rule out co-infections with other pathogens, and should not be used as the sole basis for treatment or other patient management decisions. Negative results must be combined with clinical observations, patient history, and epidemiological information. The expected result is Negative.  Fact Sheet for Patients: SugarRoll.be  Fact Sheet for Healthcare Providers: https://www.woods-mathews.com/  This test is not yet approved or cleared by the [place] FDA and  has been authorized for detection and/or diagnosis of SARS-CoV-2 by FDA under an Emergency Use Authorization (EUA). This EUA will remain  in effect (meaning this test can be used) for the duration of the COVID-19 declaration under Se ction 564(b)(1) of the Act, 21 U.S.C. section 360bbb-3(b)(1), unless the authorization is terminated or revoked sooner.  Performed at [facility], [place] [address] [place], [place] [address]       Imaging Studies   US RENAL  Result Date: 09-08-2020 CLINICAL DATA:  Acute kidney injury. EXAM: RENAL / URINARY TRACT ULTRASOUND COMPLETE COMPARISON:  None. FINDINGS: Right Kidney: Renal measurements: 9.3 x 5.1 x 4.1 cm = volume: 100.1 mL. Echogenicity within normal limits. No mass or hydronephrosis visualized. Left Kidney: Renal measurements: 9.2 x 4.6 x 3.4 cm = volume: 74 mL. Echogenicity within normal limits. No mass or hydronephrosis visualized. Bladder: Bladder is decompressed by Foley catheter. Other: None. IMPRESSION: No acute findings.  No hydronephrosis. Electronically Signed   By: Franki Cabot M.D.   On: Sep 08, 2020 17:01   DG Chest Portable 1 View  Result Date: September 08, 2020 CLINICAL DATA:  Hypotension and fall. EXAM: PORTABLE CHEST 1 VIEW COMPARISON:  Chest radiograph dated 12/17/2018. FINDINGS: The heart is enlarged. Vascular calcifications are seen in the aortic arch. Mild left basilar atelectasis/airspace disease is  noted. A small left pleural effusion may contribute. The right lung is clear without pleural effusion. There is no pneumothorax. A left subclavian approach cardiac device is redemonstrated. Degenerative changes are seen in the spine. IMPRESSION: Mild left basilar atelectasis/airspace disease. A small left pleural effusion may contribute. Aortic Atherosclerosis (ICD10-I70.0). Electronically Signed   By: Zerita Boers M.D.   On: September 08, 2020 13:58   ECHOCARDIOGRAM COMPLETE  Result Date: 09/01/2020    ECHOCARDIOGRAM REPORT   Patient Name:   [facility] [name]  Date of Exam: 09/01/2020 Medical Rec #:  [id number]           Height:       73.0 in Accession #:    [id number]          Weight:       225.0 lb Date of Birth:  1945/10/30            BSA:          2.262 m Patient Age:    75 years            BP:           117/76 mmHg Patient Gender: M                   HR:           57 bpm. Exam Location:  Inpatient Procedure: 2D Echo Indications:    acute systolic chf  History:        Patient has prior history of Echocardiogram examinations, most                 recent 12/18/2018. Cardiomyopathy, CAD, Defibrillator, chronic                 kidney disease; Risk Factors:Hypertension and Dyslipidemia.  Sonographer:    Johny Chess RDCS Referring Phys: [id number] [place]  1. Global hypokinesis with akinesis of the inferior and inferolateral walls; overall sevrere LV dysfunction.  2. Left ventricular ejection fraction, by estimation, is <20%. The left ventricle has severely decreased function. The left ventricle demonstrates regional wall motion abnormalities (see scoring diagram/findings for description). The left ventricular internal cavity size was moderately dilated. There is mild left ventricular hypertrophy. Left ventricular diastolic parameters are consistent with Grade [name] diastolic dysfunction (restrictive).  3. Right ventricular systolic function is moderately reduced. The right ventricular size is normal.  There is moderately elevated pulmonary artery systolic pressure.  4. Left atrial size was severely dilated.  5. Right atrial size was severely dilated.  6. Moderate pleural effusion in the left lateral region.  7. The mitral valve is normal in structure. Moderate mitral valve regurgitation. No evidence of mitral stenosis.  8. Tricuspid valve regurgitation is moderate.  9. The aortic valve is tricuspid. Aortic valve regurgitation is mild. Mild to moderate aortic valve sclerosis/calcification is present, without any evidence of aortic stenosis. 10. The inferior vena cava is dilated in size with >50% respiratory variability, suggesting right atrial pressure of 8 mmHg. FINDINGS  Left Ventricle: Left ventricular ejection fraction, by estimation, is <20%. The left ventricle has severely decreased function. The left ventricle demonstrates regional wall motion abnormalities. The left ventricular internal cavity size was moderately dilated. There is mild left ventricular hypertrophy. Left ventricular diastolic parameters are consistent with Grade [name] diastolic dysfunction (restrictive). Right Ventricle: The right ventricular size is normal. Right ventricular systolic function is moderately reduced. There is moderately elevated pulmonary artery systolic pressure. The tricuspid regurgitant velocity is 3.16 m/s, and with an assumed right atrial pressure of 8 mmHg, the estimated right ventricular systolic pressure is 35.5 mmHg. Left Atrium: Left atrial size was severely dilated. Right Atrium: Right atrial size was severely dilated. Pericardium: There is no evidence of pericardial effusion. Mitral Valve: The mitral valve is normal in structure. Moderate mitral valve regurgitation. No evidence of mitral valve stenosis. Tricuspid Valve: The tricuspid valve is normal in structure. Tricuspid valve regurgitation is moderate . No evidence of tricuspid stenosis. Aortic Valve: The aortic valve is  tricuspid. Aortic valve regurgitation is  mild. Mild to moderate aortic valve sclerosis/calcification is present, without any evidence of aortic stenosis. Pulmonic Valve: The pulmonic valve was normal in structure. Pulmonic valve regurgitation is not visualized. No evidence of pulmonic stenosis. Aorta: The aortic root is normal in size and structure. Venous: The inferior vena cava is dilated in size with greater than 50% respiratory variability, suggesting right atrial pressure of 8 mmHg. IAS/Shunts: No atrial level shunt detected by color flow Doppler. Additional Comments: Global hypokinesis with akinesis of the inferior and inferolateral walls; overall sevrere LV dysfunction. A device lead is visualized. There is a moderate pleural effusion in the left lateral region.  LEFT VENTRICLE PLAX 2D LVIDd:         6.00 cm      Diastology LVIDs:         5.50 cm      LV e' medial: 3.92 cm/s LV PW:         1.20 cm LV IVS:        1.30 cm LVOT diam:     2.10 cm LV SV:         44 LV SV Index:   19 LVOT Area:     3.46 cm  LV Volumes (MOD) LV vol d, MOD A4C: 229.0 ml LV vol s, MOD A4C: 177.0 ml LV SV MOD A4C:     229.0 ml RIGHT VENTRICLE         IVC TAPSE (M-mode): 1.5 cm  IVC diam: 2.30 cm LEFT ATRIUM              Index       RIGHT ATRIUM           Index LA diam:        4.70 cm  2.08 cm/m  RA Area:     31.10 cm LA Vol (A2C):   162.0 ml 71.62 ml/m RA Volume:   120.00 ml 53.05 ml/m LA Vol (A4C):   138.0 ml 61.01 ml/m LA Biplane Vol: 156.0 ml 68.96 ml/m  AORTIC VALVE LVOT Vmax:   69.50 cm/s LVOT Vmean:  44.700 cm/s LVOT VTI:    0.126 m  AORTA Ao Root diam: 3.30 cm Ao Asc diam:  3.40 cm MR Peak grad:   77.4 mmHg   TRICUSPID VALVE MR Mean grad:   44.0 mmHg   TR Peak grad:   39.9 mmHg MR Vmax:        440.00 cm/s TR Vmax:        316.00 cm/s MR Vmean:       311.0 cm/s MR PISA:        1.01 cm    SHUNTS MR PISA Radius: 0.40 cm     Systemic VTI:  0.13 m                             Systemic Diam: 2.10 cm Kirk Ruths MD Electronically signed by Kirk Ruths MD Signature  Date/Time: 09/01/2020/11:20:34 AM    Final      Medications   Scheduled Meds:  atorvastatin  80 mg Oral Daily   Chlorhexidine Gluconate Cloth  6 each Topical Daily   cholecalciferol  1,000 Units Oral Daily   clotrimazole   Topical BID   ezetimibe  10 mg Oral Daily   folic acid  1 mg Oral Daily   heparin  5,000 Units Subcutaneous Q8H   levothyroxine  125 mcg Oral Daily  multivitamin with minerals  1 tablet Oral Daily   thiamine  100 mg Oral Daily   ticagrelor  60 mg Oral BID   Continuous Infusions:     LOS: 1 day    Time spent: 30 minutes    Ezekiel Slocumb, DO [facility]  09/01/2020, 5:58 PM      If 7PM-7AM, please contact night-coverage. How to contact the [facility] Attending or Consulting provider [place] or covering provider during after hours [place], for this patient?    Check the care team in [facility] and look for a) attending/consulting [facility] provider listed and b) the [facility] team listed Log into [email or web address] and use [place]'s universal password to access. If you do not have the password, please contact the hospital operator. Locate the [facility] provider you are looking for under [facility] and page to a number that you can be directly reached. If you still have difficulty reaching the provider, please page the [facility] (Director on Call) for the Hospitalists listed on amion for assistance.

## 2020-09-01 NOTE — ED Notes (Signed)
Pt gone for ECHO.  Daily meds to be given once pt returns.

## 2020-09-01 NOTE — ED Notes (Signed)
Lunch tray ordered 

## 2020-09-02 ENCOUNTER — Telehealth: Payer: Self-pay | Admitting: Family Medicine

## 2020-09-02 DIAGNOSIS — I5043 Acute on chronic combined systolic (congestive) and diastolic (congestive) heart failure: Secondary | ICD-10-CM | POA: Diagnosis not present

## 2020-09-02 DIAGNOSIS — I9589 Other hypotension: Secondary | ICD-10-CM | POA: Diagnosis not present

## 2020-09-02 DIAGNOSIS — R531 Weakness: Secondary | ICD-10-CM

## 2020-09-02 DIAGNOSIS — R296 Repeated falls: Secondary | ICD-10-CM | POA: Diagnosis not present

## 2020-09-02 LAB — COMPREHENSIVE METABOLIC PANEL
ALT: 26 U/L (ref 0–44)
AST: 44 U/L — ABNORMAL HIGH (ref 15–41)
Albumin: 2.8 g/dL — ABNORMAL LOW (ref 3.5–5.0)
Alkaline Phosphatase: 75 U/L (ref 38–126)
Anion gap: 11 (ref 5–15)
BUN: 55 mg/dL — ABNORMAL HIGH (ref 8–23)
CO2: 22 mmol/L (ref 22–32)
Calcium: 8.7 mg/dL — ABNORMAL LOW (ref 8.9–10.3)
Chloride: 101 mmol/L (ref 98–111)
Creatinine, Ser: 2.84 mg/dL — ABNORMAL HIGH (ref 0.61–1.24)
GFR, Estimated: 22 mL/min — ABNORMAL LOW (ref >60)
Glucose, Bld: 77 mg/dL (ref 70–99)
Potassium: 4.3 mmol/L (ref 3.5–5.1)
Sodium: 134 mmol/L — ABNORMAL LOW (ref 135–145)
Total Bilirubin: 1.7 mg/dL — ABNORMAL HIGH (ref 0.3–1.2)
Total Protein: 5.2 g/dL — ABNORMAL LOW (ref 6.5–8.1)

## 2020-09-02 LAB — CBC
HCT: 39.9 % (ref 39.0–52.0)
Hemoglobin: 12.5 g/dL — ABNORMAL LOW (ref 13.0–17.0)
MCH: 26.8 pg (ref 26.0–34.0)
MCHC: 31.3 g/dL (ref 30.0–36.0)
MCV: 85.4 fL (ref 80.0–100.0)
Platelets: 297 10*3/uL (ref 150–400)
RBC: 4.67 MIL/uL (ref 4.22–5.81)
RDW: 17.2 % — ABNORMAL HIGH (ref 11.5–15.5)
WBC: 12.8 10*3/uL — ABNORMAL HIGH (ref 4.0–10.5)
nRBC: 0 % (ref 0.0–0.2)

## 2020-09-02 LAB — MAGNESIUM: Magnesium: 2.5 mg/dL — ABNORMAL HIGH (ref 1.7–2.4)

## 2020-09-02 NOTE — Telephone Encounter (Signed)
Pt in hospital.  Went to ER yesterday, they admitted him.   2 falls yesterday, hospital has taken him off of meds.  Kidney function down, weak, urine concentrated, peeing a lot, not eating.  Prob will need PT and prob will go to Assisted Living.  Wife called and wanted you to know.

## 2020-09-02 NOTE — TOC Benefit Eligibility Note (Signed)
Transition of Care [facility]) Benefit Eligibility Note    Patient Details  Name: Develle Sievers MRN: [id number] Date of Birth: 06-Sep-1945   Medication/Dose: BRILINTA 90 MG BID  Covered?: Yes  Tier: 3 Drug  Prescription Coverage Preferred Pharmacy: [facility] with Person/Company/Phone Number:: PEGGY  @ [employer] #  [phone]  Co-Pay: $ 104.55  Prior Approval: No  Deductible:  (NO DEDUCTIBLE WITH PLAN  /  OUT-OF-POCKET : Linton Ham Phone Number: 09/02/2020, 3:20 PM

## 2020-09-02 NOTE — Progress Notes (Signed)
Progress Note  Patient Name: Leonard [name] Date of Encounter: 09/02/2020  [facility] Cardiologist: Glenetta Hew, MD   Subjective   Pt denies CP or dyspnea  Inpatient Medications    Scheduled Meds:  atorvastatin  80 mg Oral Daily   Chlorhexidine Gluconate Cloth  6 each Topical Daily   cholecalciferol  1,000 Units Oral Daily   clotrimazole   Topical BID   ezetimibe  10 mg Oral Daily   folic acid  1 mg Oral Daily   heparin  5,000 Units Subcutaneous Q8H   levothyroxine  125 mcg Oral Daily   multivitamin with minerals  1 tablet Oral Daily   thiamine  100 mg Oral Daily   ticagrelor  60 mg Oral BID   Continuous Infusions:  PRN Meds: acetaminophen   Vital Signs    Vitals:   09/01/20 1504 09/01/20 1933 09/02/20 0000 09/02/20 0451  BP: (!) 151/106 106/66 102/66 97/70  Pulse: 61 66 68 64  Resp:  18 18 20   Temp: 98 F (36.7 C) 98 F (36.7 C) 97.9 F (36.6 C) 98.3 F (36.8 C)  TempSrc: Oral Oral Oral Oral  SpO2: 93% 100% 100% 96%  Weight:    105.3 kg  Height:        Intake/Output Summary (Last 24 hours) at 09/02/2020 0850 Last data filed at 09/02/2020 0454 Gross per 24 hour  Intake --  Output 1050 ml  Net -1050 ml    Last 3 Weights 09/02/2020 08/31/2020 08/28/2020  Weight (lbs) 232 lb 2.3 oz 225 lb 235 lb  Weight (kg) 105.3 kg 102.059 kg 106.595 kg      Telemetry    Sinus with first degree AV block, occasional Mobitz 1 second-degree AV block and occasional ventricular pacing- Personally Reviewed  Physical Exam   GEN: No acute distress.  WD chronically ill appearing Neck: supple Cardiac: RRR, no rub Respiratory: CTA GI: Soft, NT/ND MS: Trace to 1+ ankle edema Neuro:  Grossly intact Psych: Normal affect   Labs    High Sensitivity Troponin:   Recent Labs  Lab 08/31/20 1310 08/31/20 1430  TROPONINIHS 75* 65*       Chemistry Recent Labs  Lab 08/31/20 1310 08/31/20 1354 09/01/20 1529 09/02/20 0351  NA 130* 131* 134* 134*  K 5.0 4.9  4.9 4.3  CL 98 101 101 101  CO2 18*  --  21* 22  GLUCOSE 57* 51* 66* 77  BUN 65* 60* 59* 55*  CREATININE 3.85* 3.70* 3.10* 2.84*  CALCIUM 9.2  --  9.0 8.7*  PROT 6.2*  --   --  5.2*  ALBUMIN 3.7  --   --  2.8*  AST 42*  --   --  44*  ALT 29  --   --  26  ALKPHOS 90  --   --  75  BILITOT 2.2*  --   --  1.7*  GFRNONAA 15*  --  20* 22*  ANIONGAP 14  --  12 11      Hematology Recent Labs  Lab 08/31/20 1310 08/31/20 1354 09/01/20 1529 09/02/20 0351  WBC 15.8*  --  15.8* 12.8*  RBC 5.33  --  4.90 4.67  HGB 14.3 15.6 13.4 12.5*  HCT 46.1 46.0 41.8 39.9  MCV 86.5  --  85.3 85.4  MCH 26.8  --  27.3 26.8  MCHC 31.0  --  32.1 31.3  RDW 17.2*  --  17.1* 17.2*  PLT 309  --  283 297  BNP Recent Labs  Lab 08/31/20 1310  BNP 4,467.9*       Radiology    US RENAL  Result Date: 08/31/2020 CLINICAL DATA:  Acute kidney injury. EXAM: RENAL / URINARY TRACT ULTRASOUND COMPLETE COMPARISON:  None. FINDINGS: Right Kidney: Renal measurements: 9.3 x 5.1 x 4.1 cm = volume: 100.1 mL. Echogenicity within normal limits. No mass or hydronephrosis visualized. Left Kidney: Renal measurements: 9.2 x 4.6 x 3.4 cm = volume: 74 mL. Echogenicity within normal limits. No mass or hydronephrosis visualized. Bladder: Bladder is decompressed by Foley catheter. Other: None. IMPRESSION: No acute findings.  No hydronephrosis. Electronically Signed   By: Franki Cabot M.D.   On: 08/31/2020 17:01   DG Chest Portable 1 View  Result Date: 08/31/2020 CLINICAL DATA:  Hypotension and fall. EXAM: PORTABLE CHEST 1 VIEW COMPARISON:  Chest radiograph dated 12/17/2018. FINDINGS: The heart is enlarged. Vascular calcifications are seen in the aortic arch. Mild left basilar atelectasis/airspace disease is noted. A small left pleural effusion may contribute. The right lung is clear without pleural effusion. There is no pneumothorax. A left subclavian approach cardiac device is redemonstrated. Degenerative changes are seen  in the spine. IMPRESSION: Mild left basilar atelectasis/airspace disease. A small left pleural effusion may contribute. Aortic Atherosclerosis (ICD10-I70.0). Electronically Signed   By: Zerita Boers M.D.   On: 08/31/2020 13:58   ECHOCARDIOGRAM COMPLETE  Result Date: 09/01/2020    ECHOCARDIOGRAM REPORT   Patient Name:   Leonard [name] Date of Exam: 09/01/2020 Medical Rec #:  [id number]           Height:       73.0 in Accession #:    [id number]          Weight:       225.0 lb Date of Birth:  1945/08/26            BSA:          2.262 m Patient Age:    75 years            BP:           117/76 mmHg Patient Gender: M                   HR:           57 bpm. Exam Location:  Inpatient Procedure: 2D Echo Indications:    acute systolic chf  History:        Patient has prior history of Echocardiogram examinations, most                 recent 12/18/2018. Cardiomyopathy, CAD, Defibrillator, chronic                 kidney disease; Risk Factors:Hypertension and Dyslipidemia.  Sonographer:    Johny Chess RDCS Referring Phys: [id number] [place]  1. Global hypokinesis with akinesis of the inferior and inferolateral walls; overall sevrere LV dysfunction.  2. Left ventricular ejection fraction, by estimation, is <20%. The left ventricle has severely decreased function. The left ventricle demonstrates regional wall motion abnormalities (see scoring diagram/findings for description). The left ventricular internal cavity size was moderately dilated. There is mild left ventricular hypertrophy. Left ventricular diastolic parameters are consistent with Grade [name] diastolic dysfunction (restrictive).  3. Right ventricular systolic function is moderately reduced. The right ventricular size is normal. There is moderately elevated pulmonary artery systolic pressure.  4. Left atrial size was severely dilated.  5. Right atrial size was severely dilated.  6. Moderate pleural effusion in the left lateral region.  7. The  mitral valve is normal in structure. Moderate mitral valve regurgitation. No evidence of mitral stenosis.  8. Tricuspid valve regurgitation is moderate.  9. The aortic valve is tricuspid. Aortic valve regurgitation is mild. Mild to moderate aortic valve sclerosis/calcification is present, without any evidence of aortic stenosis. 10. The inferior vena cava is dilated in size with >50% respiratory variability, suggesting right atrial pressure of 8 mmHg. FINDINGS  Left Ventricle: Left ventricular ejection fraction, by estimation, is <20%. The left ventricle has severely decreased function. The left ventricle demonstrates regional wall motion abnormalities. The left ventricular internal cavity size was moderately dilated. There is mild left ventricular hypertrophy. Left ventricular diastolic parameters are consistent with Grade [name] diastolic dysfunction (restrictive). Right Ventricle: The right ventricular size is normal. Right ventricular systolic function is moderately reduced. There is moderately elevated pulmonary artery systolic pressure. The tricuspid regurgitant velocity is 3.16 m/s, and with an assumed right atrial pressure of 8 mmHg, the estimated right ventricular systolic pressure is 81.1 mmHg. Left Atrium: Left atrial size was severely dilated. Right Atrium: Right atrial size was severely dilated. Pericardium: There is no evidence of pericardial effusion. Mitral Valve: The mitral valve is normal in structure. Moderate mitral valve regurgitation. No evidence of mitral valve stenosis. Tricuspid Valve: The tricuspid valve is normal in structure. Tricuspid valve regurgitation is moderate . No evidence of tricuspid stenosis. Aortic Valve: The aortic valve is tricuspid. Aortic valve regurgitation is mild. Mild to moderate aortic valve sclerosis/calcification is present, without any evidence of aortic stenosis. Pulmonic Valve: The pulmonic valve was normal in structure. Pulmonic valve regurgitation is not  visualized. No evidence of pulmonic stenosis. Aorta: The aortic root is normal in size and structure. Venous: The inferior vena cava is dilated in size with greater than 50% respiratory variability, suggesting right atrial pressure of 8 mmHg. IAS/Shunts: No atrial level shunt detected by color flow Doppler. Additional Comments: Global hypokinesis with akinesis of the inferior and inferolateral walls; overall sevrere LV dysfunction. A device lead is visualized. There is a moderate pleural effusion in the left lateral region.  LEFT VENTRICLE PLAX 2D LVIDd:         6.00 cm      Diastology LVIDs:         5.50 cm      LV e' medial: 3.92 cm/s LV PW:         1.20 cm LV IVS:        1.30 cm LVOT diam:     2.10 cm LV SV:         44 LV SV Index:   19 LVOT Area:     3.46 cm  LV Volumes (MOD) LV vol d, MOD A4C: 229.0 ml LV vol s, MOD A4C: 177.0 ml LV SV MOD A4C:     229.0 ml RIGHT VENTRICLE         IVC TAPSE (M-mode): 1.5 cm  IVC diam: 2.30 cm LEFT ATRIUM              Index       RIGHT ATRIUM           Index LA diam:        4.70 cm  2.08 cm/m  RA Area:     31.10 cm LA Vol (A2C):   162.0 ml 71.62 ml/m RA Volume:   120.00 ml 53.05 ml/m LA Vol (A4C):   138.0 ml 61.01 ml/m LA  Biplane Vol: 156.0 ml 68.96 ml/m  AORTIC VALVE LVOT Vmax:   69.50 cm/s LVOT Vmean:  44.700 cm/s LVOT VTI:    0.126 m  AORTA Ao Root diam: 3.30 cm Ao Asc diam:  3.40 cm MR Peak grad:   77.4 mmHg   TRICUSPID VALVE MR Mean grad:   44.0 mmHg   TR Peak grad:   39.9 mmHg MR Vmax:        440.00 cm/s TR Vmax:        316.00 cm/s MR Vmean:       311.0 cm/s MR PISA:        1.01 cm    SHUNTS MR PISA Radius: 0.40 cm     Systemic VTI:  0.13 m                             Systemic Diam: 2.10 cm Kirk Ruths MD Electronically signed by Kirk Ruths MD Signature Date/Time: 09/01/2020/11:20:34 AM    Final      Patient Profile     75 y.o. male with past medical history of coronary artery disease, mixed ischemic/nonischemic cardiomyopathy, prior ICD, chronic  combined systolic/diastolic congestive heart failure, hypertension, hyperlipidemia, chronic stage [name] kidney disease who is admitted after falling and noted to have hypotension.  Cardiology was asked to evaluate for hypotension and acute on chronic combined systolic/diastolic congestive heart failure. Cardiac catheterization May 2018 showed nonobstructive coronary disease with patent stent in the right coronary artery.  Echocardiogram this admission showed ejection fraction less than 20%, moderate left ventricular enlargement, restrictive filling, moderate RV dysfunction, moderate pulmonary hypertension, severe biatrial enlargement, moderate mitral regurgitation, moderate tricuspid regurgitation, mild aortic insufficiency.  Assessment & Plan    Acute on chronic systolic congestive heart failure-patient presented with mechanical falls x2 and was noted to be hypotensive with acute renal insufficiency.  Blood pressure has improved.  Renal function slowly improving as well.  We will need to resume medications later as tolerated by blood pressure and renal function. Elevated digoxin level-initial digoxin level was 2.9 likely due to acute on chronic renal insufficiency as well as amiodarone interaction.  Digoxin discontinued.  He is asymptomatic at present. History of mixed ischemic/nonischemic cardiomyopathy-echocardiogram shows severe LV dysfunction.  Cardiac medications on hold due to hypotension at time of admission and acute renal failure.  Will resume at lower doses as he improves. Acute on chronic stage [name] kidney disease-renal function mildly improved today.  Continue to follow. History of ventricular tachycardia-amiodarone on hold for now.  Will resume at discharge. Coronary artery disease-continue brilinta and statin. Minimally elevated troponin-no clear trend, no chest pain and occurring in the setting of acute renal insufficiency.  No plans for further ischemia evaluation. S/P ICD  Would have  physical therapy assist with ambulation today.  Potential discharge in 24 to 48 hours.  He will need to follow-up in CHF clinic to resume lower dose medications and titrate.  For questions or updates, please contact [place] Please consult [email or web address] for contact info under        Signed, Kirk Ruths, MD  09/02/2020, 8:50 AM

## 2020-09-02 NOTE — Evaluation (Signed)
Occupational Therapy Evaluation Patient Details Name: Leonard [name] MRN: [id number] DOB: 1945-09-24 Today's Date: 09/02/2020    History of Present Illness Leonard [name] is a 75 y.o. male with medical history significant of ischemic cardiomyopathy, chronic combined systolic and diastolic heart failure, status post STEMI, status post AICD, dyslipidemia, CAD, V. fib, chronic LBBB, CAD status post RCA PCI as well as PTCA on Brilinta presents after sustaining 2 falls today.  Early this a.m. when he woke up in the dark he turned on the light and somehow he tripped and fell.  He denies hitting his head.  Denies any dizziness or prodromes prior to falling.  Then the second time this afternoon he was not using his walker which he regularly needs to walk and tripped over the doorway and fell.  Again denies any syncope or presyncopal episodes, chest pain, shortness of breath or headaches.  EMS arrived on scene and found patient to be hypotensive and bradycardic.  They reported blood pressure of systolic of 34V and heart rate of 46.   Clinical Impression   Pt. Was cooperative with evalution. Pt. Needed Mod A with sit to stand from recliner chair. Pt. Required cues for proper hand placement. Pt. Has decreased control when descending to sit. Pt. Has decreasedI with ADLs and needs further ed on use of AE. Pt is requiring assist when he leaves home and may need st snf if family is not able to provide current  level of care.     Follow Up Recommendations  Home health OT;SNF;Supervision/Assistance - 24 hour    Equipment Recommendations  None recommended by OT    Recommendations for Other Services       Precautions / Restrictions Precautions Precautions: Fall Precaution Comments: Pt. reports 2 recent fallls at home. Restrictions Weight Bearing Restrictions: No      Mobility Bed Mobility Overal bed mobility: Needs Assistance Bed Mobility: Sit to Supine       Sit to supine: Supervision         Transfers Overall transfer level: Needs assistance Equipment used: Rolling walker (2 wheeled) Transfers: Stand Pivot Transfers;Sit to/from Stand Sit to Stand: Mod assist Stand pivot transfers: Min assist       General transfer comment: Pt. required cues for proper hand placement and not to plop down and have controlled descent.    Balance Overall balance assessment: Mild deficits observed, not formally tested                                         ADL either performed or assessed with clinical judgement   ADL Overall ADL's : Needs assistance/impaired Eating/Feeding: Independent   Grooming: Wash/dry hands;Wash/dry face;Set up;Sitting   Upper Body Bathing: Set up;Sitting   Lower Body Bathing: Moderate assistance;Sit to/from stand   Upper Body Dressing : Minimal assistance;Sitting   Lower Body Dressing: Maximal assistance;Sit to/from stand   Toilet Transfer: Moderate assistance;Stand-pivot   Toileting- Clothing Manipulation and Hygiene: Moderate assistance;Sit to/from stand       Functional mobility during ADLs: Moderate assistance;Rolling walker (Pt. was mod a with sit to stand from recliner) General ADL Comments: Pt. needs further ed on use of AE for LE dressing. Pt. B big toe was hurtng and deferred attempting today.     Vision Baseline Vision/History: Wears glasses Wears Glasses: Distance only Patient Visual Report: No change from baseline Vision Assessment?: No apparent visual  deficits     Perception     Praxis      Pertinent Vitals/Pain Pain Assessment: 0-10 Pain Score: 3  Pain Location: b big toe Pain Descriptors / Indicators: Aching Pain Intervention(s): Monitored during session     Hand Dominance Right   Extremity/Trunk Assessment Upper Extremity Assessment Upper Extremity Assessment: Overall WFL for tasks assessed   Lower Extremity Assessment Lower Extremity Assessment: Defer to PT evaluation       Communication  Communication Communication: No difficulties   Cognition Arousal/Alertness: Awake/alert Behavior During Therapy: WFL for tasks assessed/performed Overall Cognitive Status: Within Functional Limits for tasks assessed                                     General Comments       Exercises     Shoulder Instructions      Home Living Family/patient expects to be discharged to:: Private residence Living Arrangements: Spouse/significant other Available Help at Discharge: Family Type of Home: House Home Access: Stairs to enter Technical brewer of Steps: 2   Home Layout: One level     Bathroom Shower/Tub: Teacher, early years/pre: Handicapped height     Home Equipment: Shower seat;Grab bars - toilet;Grab bars - tub/shower;Hand held Tourist information centre manager - 2 wheels          Prior Functioning/Environment Level of Independence: Independent with assistive device(s)                 OT Problem List: Decreased activity tolerance;Impaired balance (sitting and/or standing);Decreased knowledge of use of DME or AE;Pain      OT Treatment/Interventions:      OT Goals(Current goals can be found in the care plan section) Acute Rehab OT Goals Patient Stated Goal: go home OT Goal Formulation: With patient Time For Goal Achievement: 09/16/20 Potential to Achieve Goals: Good ADL Goals Pt Will Perform Grooming: with modified independence;standing Pt Will Perform Upper Body Bathing: with modified independence;sitting Pt Will Perform Lower Body Bathing: with modified independence;sit to/from stand Pt Will Perform Upper Body Dressing: with modified independence;sitting Pt Will Perform Lower Body Dressing: with modified independence;sit to/from stand Pt Will Transfer to Toilet: with modified independence;stand pivot transfer Pt Will Perform Toileting - Clothing Manipulation and hygiene: with modified independence;sit to/from stand  OT Frequency: Min 2X/week    Barriers to D/C: Decreased caregiver support (Pt. states they can hire someone to come stay with him during the day.)          Co-evaluation              AM-PAC OT "6 Clicks" Daily Activity     Outcome Measure Help from another person eating meals?: None Help from another person taking care of personal grooming?: A Little Help from another person toileting, which includes using toliet, bedpan, or urinal?: A Lot Help from another person bathing (including washing, rinsing, drying)?: A Lot Help from another person to put on and taking off regular upper body clothing?: A Little Help from another person to put on and taking off regular lower body clothing?: A Lot 6 Click Score: 16   End of Session Equipment Utilized During Treatment: Rolling walker;Gait belt Nurse Communication:  (ok therapy)  Activity Tolerance: Patient tolerated treatment well Patient left: in bed;with call bell/phone within reach;with bed alarm set  OT Visit Diagnosis: Unsteadiness on feet (R26.81);Repeated falls (R29.6)  Time: 9641-8937 OT Time Calculation (min): 40 min Charges:  OT General Charges $OT Visit: 1 Visit OT Evaluation $OT Eval Moderate Complexity: 1 Mod OT Treatments $Self Care/Home Management : 8-22 mins  Reece Packer OT/L   Leonard [name] 09/02/2020, 11:23 AM

## 2020-09-02 NOTE — Progress Notes (Signed)
Inpatient Diabetes Program Recommendations  AACE/ADA: New Consensus Statement on Inpatient Glycemic Control   Target Ranges:  Prepandial:   less than 140 mg/dL      Peak postprandial:   less than 180 mg/dL (1-2 hours)      Critically ill patients:  140 - 180 mg/dL  Results for Leonard, [name] (MRN [id number]) as of 09/02/2020 14:01  Ref. Range 08/31/2020 13:10 08/31/2020 13:54 09/01/2020 15:29 09/02/2020 03:51  Glucose Latest Ref Range: 70 - 99 mg/dL 57 (L) 51 (L) 66 (L) 77   Results for Leonard, [name] (MRN [id number]) as of 09/02/2020 14:01  Ref. Range 04/29/2020 09:39  Hemoglobin A1C Latest Ref Range: 4.8 - 5.6 % 5.9 (H)    Review of Glycemic Control  Diabetes history: Pre-DM (per note on 04/29/20 by V.Henson, NP-C) Outpatient Diabetes medications: Jardiance 10 mg QAM (prescribed 08/28/20 by Dr. Ellyn Hack) Current orders for Inpatient glycemic control: None  Inpatient Diabetes Program Recommendations:    DM medication: May want to consider discontinuing Jardiance as an outpatient.  NOTE: Per chart review, patient has hx of prediabetes and last A1C was 5.9% on 04/29/20. Noted patient was prescribed Jardiance 10 mg QAM on 08/28/20 by Dr. Ellyn Hack. Patient admitted with falls and glucose 51-77 mg/dl on labs over the past 3 days.   Thanks, Barnie Alderman, RN, MSN, CDE Diabetes Coordinator Inpatient Diabetes Program [phone] (Team Pager from 8am to 5pm)

## 2020-09-02 NOTE — Progress Notes (Signed)
Pt is  evaluated today and note his general weakness, and inability to maintain sitting posture safely.  Pt is going to be recommended to get SNF admission due to his safety and general weak and low endurance with gait.  Follow for acute goals of PT and anticipate pt may try to refuse rehab stay.    09/02/20 1600  PT Visit Information  Last PT Received On 09/02/20  Assistance Needed +1  History of Present Illness Leonard [name] is a 75 y.o. male with medical history significant of ischemic cardiomyopathy, chronic combined systolic and diastolic heart failure, status post STEMI, status post AICD, dyslipidemia, CAD, V. fib, chronic LBBB, CAD status post RCA PCI as well as PTCA on Brilinta presents after sustaining 2 falls today.  Early this a.m. when he woke up in the dark he turned on the light and somehow he tripped and fell.  He denies hitting his head.  Denies any dizziness or prodromes prior to falling.  Then the second time this afternoon he was not using his walker which he regularly needs to walk and tripped over the doorway and fell.  Again denies any syncope or presyncopal episodes, chest pain, shortness of breath or headaches.  EMS arrived on scene and found patient to be hypotensive and bradycardic.  They reported blood pressure of systolic of 70W and heart rate of 46.  Subjective Data  Patient Stated Goal go home  Precautions  Precautions Fall  Precaution Comments Pt. reports 2 recent fallls at home.  Restrictions  Weight Bearing Restrictions No  Pain Assessment  Pain Assessment Faces  Faces Pain Scale 4  Pain Location feet  Pain Descriptors / Indicators Aching  Pain Intervention(s) Monitored during session  Cognition  Arousal/Alertness Awake/alert  Behavior During Therapy WFL for tasks assessed/performed  Overall Cognitive Status Within Functional Limits for tasks assessed  Bed Mobility  Overal bed mobility Needs Assistance  Bed Mobility Supine to Sit  Supine to sit Min  assist  Transfers  Overall transfer level Needs assistance  Equipment used Rolling walker (2 wheeled)  Transfers Stand Pivot Transfers;Sit to/from Stand  Sit to [employer] assist  Stand pivot transfers Min assist  General transfer comment poor descent control  Ambulation/Gait  Ambulation/Gait assistance Min guard;Min assist  General Gait Details steps are related to transfers  Balance  Overall balance assessment Mild deficits observed, not formally tested;Needs assistance  Sitting-balance support Feet supported  Sitting balance-Leahy Scale Fair  Postural control Posterior lean  Standing balance support Bilateral upper extremity supported;During functional activity  Standing balance-Leahy Scale Poor  General Comments  General comments (skin integrity, edema, etc.) Pt is in chair with poor tolerance to sit, tends to slide down the chair  Exercises  Exercises Other exercises (LE strength is 4- to 4)  PT - End of Session  Equipment Utilized During Treatment Gait belt  Activity Tolerance Patient limited by fatigue;Treatment limited secondary to medical complications (Comment)  Patient left in chair;with call bell/phone within reach;with chair alarm set  Nurse Communication Mobility status   PT - Assessment/Plan  PT Visit Diagnosis Unsteadiness on feet (R26.81);Muscle weakness (generalized) (M62.81);Difficulty in walking, not elsewhere classified (R26.2)  PT Frequency (ACUTE ONLY) Min 3X/week  Follow Up Recommendations SNF  PT equipment None recommended by PT  AM-PAC PT "6 Clicks" Mobility Outcome Measure (Version 2)  Help needed turning from your back to your side while in a flat bed without using bedrails? 3  Help needed moving from lying on your back  to sitting on the side of a flat bed without using bedrails? 3  Help needed moving to and from a bed to a chair (including a wheelchair)? 3  Help needed standing up from a chair using your arms (e.g., wheelchair or bedside chair)? 3   Help needed to walk in hospital room? 3  Help needed climbing 3-5 steps with a railing?  1  6 Click Score 16  Consider Recommendation of Discharge To: Home with [facility]  Progressive Mobility  What is the highest level of mobility based on the progressive mobility assessment? Level 3 (Stands with assist) - Balance while standing  and cannot march in place  Mobility Out of bed to chair with meals  Acute Rehab PT Goals  PT Goal Formulation With patient  Time For Goal Achievement 09/16/20  Potential to Achieve Goals Good  PT Time Calculation  PT Start Time (ACUTE ONLY) 0903  PT Stop Time (ACUTE ONLY) 0944  PT Time Calculation (min) (ACUTE ONLY) 41 min  PT General Charges  $$ ACUTE PT VISIT 1 Visit  PT Evaluation  $PT Eval Moderate Complexity 1 Mod  PT Treatments  $Therapeutic Activity 23-37 mins   Mee Hives, PT MS Acute Rehab Dept. Number: [place] [facility] [place]

## 2020-09-02 NOTE — Progress Notes (Signed)
PROGRESS NOTE    Leonard [name]   OMV:[id number]  DOB: 30-Mar-1945  PCP: Girtha Rm, NP-C    DOA: 08/31/2020 LOS: 2   Assessment & Plan   Principal Problem:   Hypotension Active Problems:   Acute on chronic combined systolic and diastolic CHF (congestive heart failure) (HCC)   Bradycardia   Multiple falls   Essential hypertension   Hyperlipidemia LDL goal <70   Coronary artery disease involving native coronary artery without angina pectoris   Generalized weakness   CKD (chronic kidney disease) stage 3, GFR 30-59 ml/min (HCC)   Hypothyroid   Intertrigo   Mechanical fall -x2 -  likely related to hypotension and bradycardia with resulting generalized weakness. Pt also was not using his walker. --PT/OT evaluations pending --Hold cardiac meds as bp low --Hold amiodarone, digoxin due to bradycardia   Acute on Chronic combined systolic/diastolic CHF Ischemic cardiomyopathy - presenting w/hypotension and AKI. Status post ICD Hx of ventricular tachycardia --Cardiology following, appreciate recs --Hold Entresto, Jardiance, Aldactone, Demadex, digoxin --Resume meds cautiously as BP tolerates, per cards --Follow up Echo (last EF 15-20% in 2021) --Strict I/O's & daily weights  Hypotension - BP improved with holding cardiac meds --maintain MAP>65 --otherwise mgmt as outlined, per cardiology  Digoxin Toxicity Bradycardia --hold digoxin and amiodarone --telemetry monitoring --cardiology recommending against resuming digoxin long term   CAD with hx/o PCI --Continue Brilinta, Lipitor, Zetia   Acute on CKD stage IIIB - AKI POA with Cr 3.85 AKI improving. Cr 3.85>>2.84 --hold cardiac meds for AKI and low BP's --IVF bolus given in ED and Foley placed by EMS Cr improved to 3.10 (8/14) --Low EF, will encouraged PO hydration --IV fluids only if needed to maintain MAP>65 --Avoid nephrotoxic meds --Renal ultrasound negative for obstruction   Elevated troponin -  unlikely ACS, trend flat, no chest pain or significant acute ischemic changes on EKG.  Likely demand ischemia  Prolonged Qtc - 502 ms on EKG.   --Avoid QT prolonging meds --Telemetry --Maintain K>4, Mg>2  Candidal intertrigo - unable to give Diflucan with prolonged QT interval.   --Topical clotrimazole BID x 2-4 weeks --Monitor closely  Prediabetes - A1c 5.9% on 04/29/20.  On Jardiance for CHF. Hypoglycemia - fasting glucose 51-77 here.   --need to discuss risks/benefits of Jardiance   Hypothyroidism - continue Synthroid  Vitamin d deficiency - continue supplement  HLD - continue Lipitor    Patient BMI: Body mass index is 30.63 kg/m.   DVT prophylaxis: heparin injection 5,000 Units Start: 08/31/20 1800 SCDs Start: 08/31/20 1557   Diet:  Diet Orders (From admission, onward)     Start     Ordered   08/31/20 1558  Diet 2 gram sodium Room service appropriate? Yes; Fluid consistency: Thin  Diet effective now       Question Answer Comment  Room service appropriate? Yes   Fluid consistency: Thin      08/31/20 1559              Code Status: Full Code   Brief Narrative / Hospital Course to Date:   Leonard [name] is a 75 y.o. male with medical history significant of ischemic cardiomyopathy, chronic combined systolic and diastolic heart failure, status post STEMI, status post AICD, dyslipidemia, CAD, V. fib, chronic LBBB, CAD status post RCA PCI as well as PTCA on Brilinta presents after sustaining 2 falls.  EMS was called and patient found to be hypotensive and bradycardic.  Also with urinary retention with Foley  placed with large urine return.  BP improved with IV hydration. Has significant AKI with Cr 3.85 on admission. Cardiology is consulted and following.  Subjective 09/02/20    Patient awake resting in bed when seen this morning on rounds.  He says he is feeling much better today.  Really wanting to go home.  Reports he was frustrated that he did not feel  like he did very well up with therapy and using the walker.  States he does much better with his walker at home where he is familiar with the environment.   Disposition Plan & Communication   Status is: Inpatient  Remains inpatient appropriate because:Inpatient level of care appropriate due to severity of illness and ongoing diagnostic evaluation  Dispo: The patient is from: Home              Anticipated d/c is to: Home              Patient currently is not medically stable to d/c.   Difficult to place patient No   Family Communication: Wife at bedside on rounds 8/14.  Unable to reach wife by phone for update on 8/15.   Consults, Procedures, Significant Events   Consultants:  Cardiology  Procedures:  None  Antimicrobials:  Anti-infectives (From admission, onward)    None         Micro    Objective   Vitals:   09/01/20 1504 09/01/20 1933 09/02/20 0000 09/02/20 0451  BP: (!) 151/106 106/66 102/66 97/70  Pulse: 61 66 68 64  Resp:  18 18 20   Temp: 98 F (36.7 C) 98 F (36.7 C) 97.9 F (36.6 C) 98.3 F (36.8 C)  TempSrc: Oral Oral Oral Oral  SpO2: 93% 100% 100% 96%  Weight:    105.3 kg  Height:        Intake/Output Summary (Last 24 hours) at 09/02/2020 1445 Last data filed at 09/02/2020 0900 Gross per 24 hour  Intake 240 ml  Output 1050 ml  Net -810 ml   Filed Weights   08/31/20 1254 09/02/20 0451  Weight: 102.1 kg 105.3 kg    Physical Exam:  General exam: awake, appears drowsy, no acute distress Respiratory system: CTAB, with diminished bases, normal respiratory effort. Cardiovascular system: normal S1/S2, RRR, trace-1+ lower extremity edema  Gastrointestinal system: soft, NT, ND,  Central nervous system: A&O. no gross focal neurologic deficits, normal speech Psych:normal mood, congruent affect    Labs   Data Reviewed: I have personally reviewed following labs and imaging studies  CBC: Recent Labs  Lab 08/31/20 1310 08/31/20 1354  09/01/20 1529 09/02/20 0351  WBC 15.8*  --  15.8* 12.8*  NEUTROABS 13.6*  --   --   --   HGB 14.3 15.6 13.4 12.5*  HCT 46.1 46.0 41.8 39.9  MCV 86.5  --  85.3 85.4  PLT 309  --  283 700   Basic Metabolic Panel: Recent Labs  Lab 08/31/20 1310 08/31/20 1354 09/01/20 1529 09/02/20 0351  NA 130* 131* 134* 134*  K 5.0 4.9 4.9 4.3  CL 98 101 101 101  CO2 18*  --  21* 22  GLUCOSE 57* 51* 66* 77  BUN 65* 60* 59* 55*  CREATININE 3.85* 3.70* 3.10* 2.84*  CALCIUM 9.2  --  9.0 8.7*  MG 2.4  --   --  2.5*   GFR: Estimated Creatinine Clearance: 27.8 mL/min (A) (by C-G formula based on SCr of 2.84 mg/dL (H)). Liver Function Tests: Recent Labs  Lab 09/20/20 1310 09/02/20 0351  AST 42* 44*  ALT 29 26  ALKPHOS 90 75  BILITOT 2.2* 1.7*  PROT 6.2* 5.2*  ALBUMIN 3.7 2.8*   No results for input(s): LIPASE, AMYLASE in the last 168 hours. No results for input(s): AMMONIA in the last 168 hours. Coagulation Profile: No results for input(s): INR, PROTIME in the last 168 hours. Cardiac Enzymes: No results for input(s): CKTOTAL, CKMB, CKMBINDEX, TROPONINI in the last 168 hours. BNP (last 3 results) No results for input(s): PROBNP in the last 8760 hours. HbA1C: No results for input(s): HGBA1C in the last 72 hours. CBG: Recent Labs  Lab 09/01/20 2124  GLUCAP 134*   Lipid Profile: No results for input(s): CHOL, HDL, LDLCALC, TRIG, CHOLHDL, LDLDIRECT in the last 72 hours. Thyroid Function Tests: Recent Labs    20-Sep-2020 1310  TSH 5.767*  FREET4 1.47*   Anemia Panel: No results for input(s): VITAMINB12, FOLATE, FERRITIN, TIBC, IRON, RETICCTPCT in the last 72 hours. Sepsis Labs: Recent Labs  Lab 09/01/20 1529  PROCALCITON 0.12    Recent Results (from the past 240 hour(s))  SARS CORONAVIRUS 2 (TAT 6-24 HRS) Nasopharyngeal Nasopharyngeal Swab     Status: None   Collection Time: 09/20/20  7:22 PM   Specimen: Nasopharyngeal Swab  Result Value Ref Range Status   SARS  Coronavirus 2 NEGATIVE NEGATIVE Final    Comment: (NOTE) SARS-CoV-2 target nucleic acids are NOT DETECTED.  The SARS-CoV-2 RNA is generally detectable in upper and lower respiratory specimens during the acute phase of infection. Negative results do not preclude SARS-CoV-2 infection, do not rule out co-infections with other pathogens, and should not be used as the sole basis for treatment or other patient management decisions. Negative results must be combined with clinical observations, patient history, and epidemiological information. The expected result is Negative.  Fact Sheet for Patients: SugarRoll.be  Fact Sheet for Healthcare Providers: https://www.woods-mathews.com/  This test is not yet approved or cleared by the [place] FDA and  has been authorized for detection and/or diagnosis of SARS-CoV-2 by FDA under an Emergency Use Authorization (EUA). This EUA will remain  in effect (meaning this test can be used) for the duration of the COVID-19 declaration under Se ction 564(b)(1) of the Act, 21 U.S.C. section 360bbb-3(b)(1), unless the authorization is terminated or revoked sooner.  Performed at [facility], [place] [address], [place], [place] [address]       Imaging Studies   US RENAL  Result Date: 09/20/20 CLINICAL DATA:  Acute kidney injury. EXAM: RENAL / URINARY TRACT ULTRASOUND COMPLETE COMPARISON:  None. FINDINGS: Right Kidney: Renal measurements: 9.3 x 5.1 x 4.1 cm = volume: 100.1 mL. Echogenicity within normal limits. No mass or hydronephrosis visualized. Left Kidney: Renal measurements: 9.2 x 4.6 x 3.4 cm = volume: 74 mL. Echogenicity within normal limits. No mass or hydronephrosis visualized. Bladder: Bladder is decompressed by Foley catheter. Other: None. IMPRESSION: No acute findings.  No hydronephrosis. Electronically Signed   By: Franki Cabot M.D.   On: 20-Sep-2020 17:01   ECHOCARDIOGRAM COMPLETE  Result  Date: 09/01/2020    ECHOCARDIOGRAM REPORT   Patient Name:   JEVONTE CLANTON [name] Date of Exam: 09/01/2020 Medical Rec #:  [id number]           Height:       73.0 in Accession #:    [id number]          Weight:       225.0 lb Date of Birth:  1945-09-20            BSA:          2.262 m Patient Age:    33 years            BP:           117/76 mmHg Patient Gender: M                   HR:           57 bpm. Exam Location:  Inpatient Procedure: 2D Echo Indications:    acute systolic chf  History:        Patient has prior history of Echocardiogram examinations, most                 recent 12/18/2018. Cardiomyopathy, CAD, Defibrillator, chronic                 kidney disease; Risk Factors:Hypertension and Dyslipidemia.  Sonographer:    Johny Chess RDCS Referring Phys: [id number] [place]  1. Global hypokinesis with akinesis of the inferior and inferolateral walls; overall sevrere LV dysfunction.  2. Left ventricular ejection fraction, by estimation, is <20%. The left ventricle has severely decreased function. The left ventricle demonstrates regional wall motion abnormalities (see scoring diagram/findings for description). The left ventricular internal cavity size was moderately dilated. There is mild left ventricular hypertrophy. Left ventricular diastolic parameters are consistent with Grade [name] diastolic dysfunction (restrictive).  3. Right ventricular systolic function is moderately reduced. The right ventricular size is normal. There is moderately elevated pulmonary artery systolic pressure.  4. Left atrial size was severely dilated.  5. Right atrial size was severely dilated.  6. Moderate pleural effusion in the left lateral region.  7. The mitral valve is normal in structure. Moderate mitral valve regurgitation. No evidence of mitral stenosis.  8. Tricuspid valve regurgitation is moderate.  9. The aortic valve is tricuspid. Aortic valve regurgitation is mild. Mild to moderate aortic valve  sclerosis/calcification is present, without any evidence of aortic stenosis. 10. The inferior vena cava is dilated in size with >50% respiratory variability, suggesting right atrial pressure of 8 mmHg. FINDINGS  Left Ventricle: Left ventricular ejection fraction, by estimation, is <20%. The left ventricle has severely decreased function. The left ventricle demonstrates regional wall motion abnormalities. The left ventricular internal cavity size was moderately dilated. There is mild left ventricular hypertrophy. Left ventricular diastolic parameters are consistent with Grade [name] diastolic dysfunction (restrictive). Right Ventricle: The right ventricular size is normal. Right ventricular systolic function is moderately reduced. There is moderately elevated pulmonary artery systolic pressure. The tricuspid regurgitant velocity is 3.16 m/s, and with an assumed right atrial pressure of 8 mmHg, the estimated right ventricular systolic pressure is 56.2 mmHg. Left Atrium: Left atrial size was severely dilated. Right Atrium: Right atrial size was severely dilated. Pericardium: There is no evidence of pericardial effusion. Mitral Valve: The mitral valve is normal in structure. Moderate mitral valve regurgitation. No evidence of mitral valve stenosis. Tricuspid Valve: The tricuspid valve is normal in structure. Tricuspid valve regurgitation is moderate . No evidence of tricuspid stenosis. Aortic Valve: The aortic valve is tricuspid. Aortic valve regurgitation is mild. Mild to moderate aortic valve sclerosis/calcification is present, without any evidence of aortic stenosis. Pulmonic Valve: The pulmonic valve was normal in structure. Pulmonic valve regurgitation is not visualized. No evidence of pulmonic stenosis. Aorta: The aortic root is normal in size and structure. Venous: The inferior vena cava is  dilated in size with greater than 50% respiratory variability, suggesting right atrial pressure of 8 mmHg. IAS/Shunts: No  atrial level shunt detected by color flow Doppler. Additional Comments: Global hypokinesis with akinesis of the inferior and inferolateral walls; overall sevrere LV dysfunction. A device lead is visualized. There is a moderate pleural effusion in the left lateral region.  LEFT VENTRICLE PLAX 2D LVIDd:         6.00 cm      Diastology LVIDs:         5.50 cm      LV e' medial: 3.92 cm/s LV PW:         1.20 cm LV IVS:        1.30 cm LVOT diam:     2.10 cm LV SV:         44 LV SV Index:   19 LVOT Area:     3.46 cm  LV Volumes (MOD) LV vol d, MOD A4C: 229.0 ml LV vol s, MOD A4C: 177.0 ml LV SV MOD A4C:     229.0 ml RIGHT VENTRICLE         IVC TAPSE (M-mode): 1.5 cm  IVC diam: 2.30 cm LEFT ATRIUM              Index       RIGHT ATRIUM           Index LA diam:        4.70 cm  2.08 cm/m  RA Area:     31.10 cm LA Vol (A2C):   162.0 ml 71.62 ml/m RA Volume:   120.00 ml 53.05 ml/m LA Vol (A4C):   138.0 ml 61.01 ml/m LA Biplane Vol: 156.0 ml 68.96 ml/m  AORTIC VALVE LVOT Vmax:   69.50 cm/s LVOT Vmean:  44.700 cm/s LVOT VTI:    0.126 m  AORTA Ao Root diam: 3.30 cm Ao Asc diam:  3.40 cm MR Peak grad:   77.4 mmHg   TRICUSPID VALVE MR Mean grad:   44.0 mmHg   TR Peak grad:   39.9 mmHg MR Vmax:        440.00 cm/s TR Vmax:        316.00 cm/s MR Vmean:       311.0 cm/s MR PISA:        1.01 cm    SHUNTS MR PISA Radius: 0.40 cm     Systemic VTI:  0.13 m                             Systemic Diam: 2.10 cm Kirk Ruths MD Electronically signed by Kirk Ruths MD Signature Date/Time: 09/01/2020/11:20:34 AM    Final      Medications   Scheduled Meds:  atorvastatin  80 mg Oral Daily   Chlorhexidine Gluconate Cloth  6 each Topical Daily   cholecalciferol  1,000 Units Oral Daily   clotrimazole   Topical BID   ezetimibe  10 mg Oral Daily   folic acid  1 mg Oral Daily   heparin  5,000 Units Subcutaneous Q8H   levothyroxine  125 mcg Oral Daily   multivitamin with minerals  1 tablet Oral Daily   thiamine  100 mg Oral Daily    ticagrelor  60 mg Oral BID   Continuous Infusions:     LOS: 2 days    Time spent: 30 minutes with > 50% spent at bedside and in coordination of care.  Ezekiel Slocumb, DO [facility]  09/02/2020, 2:45 PM      If 7PM-7AM, please contact night-coverage. How to contact the [facility] Attending or Consulting provider [place] or covering provider during after hours [place], for this patient?    Check the care team in [facility] and look for a) attending/consulting [facility] provider listed and b) the [facility] team listed Log into [email or web address] and use [place]'s universal password to access. If you do not have the password, please contact the hospital operator. Locate the [facility] provider you are looking for under [facility] and page to a number that you can be directly reached. If you still have difficulty reaching the provider, please page the [facility] (Director on Call) for the Hospitalists listed on amion for assistance.

## 2020-09-02 NOTE — Evaluation (Deleted)
Physical Therapy Evaluation Patient Details Name: Leonard [name] MRN: [id number] DOB: [birth year] Today's Date: 09/02/2020   History of Present Illness  Leonard [name] is a 75 y.o. male with medical history significant of ischemic cardiomyopathy, chronic combined systolic and diastolic heart failure, status post STEMI, status post AICD, dyslipidemia, CAD, V. fib, chronic LBBB, CAD status post RCA PCI as well as PTCA on Brilinta presents after sustaining 2 falls today.  Early this a.m. when he woke up in the dark he turned on the light and somehow he tripped and fell.  He denies hitting his head.  Denies any dizziness or prodromes prior to falling.  Then the second time this afternoon he was not using his walker which he regularly needs to walk and tripped over the doorway and fell.  Again denies any syncope or presyncopal episodes, chest pain, shortness of breath or headaches.  EMS arrived on scene and found patient to be hypotensive and bradycardic.  They reported blood pressure of systolic of 74B and heart rate of 46.  Clinical Impression  Pt is  evaluated today and note his general weakness, and inability to maintain sitting posture safely.  Pt is going to be recommended to get SNF admission due to his safety and general weak and low endurance with gait.  Follow for acute goals of PT and anticipate pt may try to refuse rehab stay.      Follow Up Recommendations SNF    Equipment Recommendations  None recommended by PT    Recommendations for Other Services       Precautions / Restrictions Precautions Precautions: Fall Precaution Comments: Pt. reports 2 recent fallls at home. Restrictions Weight Bearing Restrictions: No      Mobility  Bed Mobility Overal bed mobility: Needs Assistance Bed Mobility: Supine to Sit     Supine to sit: Min assist          Transfers Overall transfer level: Needs assistance Equipment used: Rolling walker (2 wheeled) Transfers: Stand Pivot  Transfers;Sit to/from Stand Sit to Stand: Min assist Stand pivot transfers: Min assist       General transfer comment: poor descent control  Ambulation/Gait Ambulation/Gait assistance: Min guard;Min assist           General Gait Details: steps are related to transfers  Stairs            Wheelchair Mobility    Modified Rankin (Stroke Patients Only)       Balance Overall balance assessment: Mild deficits observed, not formally tested;Needs assistance Sitting-balance support: Feet supported Sitting balance-Leahy Scale: Fair   Postural control: Posterior lean Standing balance support: Bilateral upper extremity supported;During functional activity Standing balance-Leahy Scale: Poor                               Pertinent Vitals/Pain Pain Assessment: Faces Faces Pain Scale: Hurts little more Pain Location: feet Pain Descriptors / Indicators: Aching Pain Intervention(s): Monitored during session    Home Living Family/patient expects to be discharged to:: Private residence Living Arrangements: Spouse/significant other Available Help at Discharge: Family Type of Home: House Home Access: Stairs to enter   Technical brewer of Steps: 2 Home Layout: One level Home Equipment: Shower seat;Grab bars - toilet;Grab bars - tub/shower;Hand held shower head;Walker - 2 wheels      Prior Function Level of Independence: Independent with assistive device(s)  Hand Dominance   Dominant Hand: Right    Extremity/Trunk Assessment   Upper Extremity Assessment Upper Extremity Assessment: Defer to OT evaluation    Lower Extremity Assessment Lower Extremity Assessment: Generalized weakness    Cervical / Trunk Assessment Cervical / Trunk Assessment: Kyphotic (mild)  Communication   Communication: No difficulties  Cognition Arousal/Alertness: Awake/alert Behavior During Therapy: WFL for tasks assessed/performed Overall Cognitive  Status: Within Functional Limits for tasks assessed                                        General Comments General comments (skin integrity, edema, etc.): Pt is in chair with poor tolerance to sit, tends to slide down the chair    Exercises     Assessment/Plan    PT Assessment Patient needs continued PT services  PT Problem List Decreased strength;Decreased range of motion;Decreased activity tolerance;Decreased balance;Decreased mobility;Decreased coordination;Decreased safety awareness       PT Treatment Interventions      PT Goals (Current goals can be found in the Care Plan section)  Acute Rehab PT Goals Patient Stated Goal: go home PT Goal Formulation: With patient Time For Goal Achievement: 09/16/20 Potential to Achieve Goals: Good    Frequency Min 3X/week   Barriers to discharge Decreased caregiver support;Inaccessible home environment pt will be home aloneat times and is unsafe    Co-evaluation               AM-PAC PT "6 Clicks" Mobility  Outcome Measure Help needed turning from your back to your side while in a flat bed without using bedrails?: A Little Help needed moving from lying on your back to sitting on the side of a flat bed without using bedrails?: A Little Help needed moving to and from a bed to a chair (including a wheelchair)?: A Little Help needed standing up from a chair using your arms (e.g., wheelchair or bedside chair)?: A Little Help needed to walk in hospital room?: A Little Help needed climbing 3-5 steps with a railing? : Total 6 Click Score: 16    End of Session Equipment Utilized During Treatment: Gait belt Activity Tolerance: Patient limited by fatigue;Treatment limited secondary to medical complications (Comment) Patient left: in chair;with call bell/phone within reach;with chair alarm set Nurse Communication: Mobility status PT Visit Diagnosis: Unsteadiness on feet (R26.81);Muscle weakness (generalized)  (M62.81);Difficulty in walking, not elsewhere classified (R26.2)    Time: 5170-0174 PT Time Calculation (min) (ACUTE ONLY): 41 min   Charges:   PT Evaluation $PT Eval Moderate Complexity: 1 Mod PT Treatments $Therapeutic Activity: 8-22 mins       Ramond Dial 09/02/2020, 4:29 PM  .rut

## 2020-09-03 ENCOUNTER — Other Ambulatory Visit: Payer: Self-pay

## 2020-09-03 DIAGNOSIS — I9589 Other hypotension: Secondary | ICD-10-CM | POA: Diagnosis not present

## 2020-09-03 DIAGNOSIS — R001 Bradycardia, unspecified: Secondary | ICD-10-CM | POA: Diagnosis not present

## 2020-09-03 DIAGNOSIS — R531 Weakness: Secondary | ICD-10-CM | POA: Diagnosis not present

## 2020-09-03 DIAGNOSIS — I5043 Acute on chronic combined systolic (congestive) and diastolic (congestive) heart failure: Secondary | ICD-10-CM | POA: Diagnosis not present

## 2020-09-03 LAB — COMPREHENSIVE METABOLIC PANEL
ALT: 26 U/L (ref 0–44)
AST: 34 U/L (ref 15–41)
Albumin: 3 g/dL — ABNORMAL LOW (ref 3.5–5.0)
Alkaline Phosphatase: 78 U/L (ref 38–126)
Anion gap: 12 (ref 5–15)
BUN: 54 mg/dL — ABNORMAL HIGH (ref 8–23)
CO2: 20 mmol/L — ABNORMAL LOW (ref 22–32)
Calcium: 8.9 mg/dL (ref 8.9–10.3)
Chloride: 98 mmol/L (ref 98–111)
Creatinine, Ser: 2.83 mg/dL — ABNORMAL HIGH (ref 0.61–1.24)
GFR, Estimated: 22 mL/min — ABNORMAL LOW (ref >60)
Glucose, Bld: 115 mg/dL — ABNORMAL HIGH (ref 70–99)
Potassium: 4.2 mmol/L (ref 3.5–5.1)
Sodium: 130 mmol/L — ABNORMAL LOW (ref 135–145)
Total Bilirubin: 1.5 mg/dL — ABNORMAL HIGH (ref 0.3–1.2)
Total Protein: 5.5 g/dL — ABNORMAL LOW (ref 6.5–8.1)

## 2020-09-03 LAB — CBC
HCT: 40 % (ref 39.0–52.0)
Hemoglobin: 12.6 g/dL — ABNORMAL LOW (ref 13.0–17.0)
MCH: 26.9 pg (ref 26.0–34.0)
MCHC: 31.5 g/dL (ref 30.0–36.0)
MCV: 85.3 fL (ref 80.0–100.0)
Platelets: 279 10*3/uL (ref 150–400)
RBC: 4.69 MIL/uL (ref 4.22–5.81)
RDW: 17.2 % — ABNORMAL HIGH (ref 11.5–15.5)
WBC: 11.3 10*3/uL — ABNORMAL HIGH (ref 4.0–10.5)
nRBC: 0 % (ref 0.0–0.2)

## 2020-09-03 MED ORDER — MIDODRINE HCL 5 MG PO TABS
2.5000 mg | ORAL_TABLET | Freq: Three times a day (TID) | ORAL | Status: DC
  Administered 2020-09-03 (×3): 2.5 mg via ORAL
  Filled 2020-09-03 (×3): qty 1

## 2020-09-03 MED ORDER — TAMSULOSIN HCL 0.4 MG PO CAPS
0.4000 mg | ORAL_CAPSULE | Freq: Every day | ORAL | Status: DC
  Administered 2020-09-03 – 2020-09-05 (×3): 0.4 mg via ORAL
  Filled 2020-09-03 (×3): qty 1

## 2020-09-03 MED ORDER — MIDODRINE HCL 5 MG PO TABS
5.0000 mg | ORAL_TABLET | Freq: Once | ORAL | Status: AC
  Administered 2020-09-03: 5 mg via ORAL
  Filled 2020-09-03: qty 1

## 2020-09-03 MED ORDER — MIDODRINE HCL 5 MG PO TABS
5.0000 mg | ORAL_TABLET | Freq: Three times a day (TID) | ORAL | Status: DC
  Administered 2020-09-04 – 2020-09-05 (×6): 5 mg via ORAL
  Filled 2020-09-03 (×6): qty 1

## 2020-09-03 NOTE — Progress Notes (Signed)
PROGRESS NOTE    Fines Kimberlin III   JGG:[id number]  DOB: 08-29-1945  PCP: Girtha Rm, NP-C    DOA: 08/31/2020 LOS: 3   Assessment & Plan   Principal Problem:   Hypotension Active Problems:   Acute on chronic combined systolic and diastolic CHF (congestive heart failure) (HCC)   Bradycardia   Multiple falls   Essential hypertension   Hyperlipidemia LDL goal <70   Coronary artery disease involving native coronary artery without angina pectoris   Generalized weakness   CKD (chronic kidney disease) stage 3, GFR 30-59 ml/min (HCC)   Hypothyroid   Intertrigo   Mechanical fall -x2 -  likely related to hypotension and bradycardia with resulting generalized weakness. Pt also was not using his walker. --PT/OT evaluations pending --Hold cardiac meds as bp low --Hold amiodarone, digoxin due to bradycardia   Acute on Chronic combined systolic/diastolic CHF Ischemic cardiomyopathy - presenting w/hypotension and AKI. Status post ICD Hx of ventricular tachycardia --Cardiology following, appreciate recs --Hold Entresto, Jardiance, Aldactone, Demadex, digoxin --Resume meds cautiously as BP tolerates, per cards --Follow up Echo (last EF 15-20% in 2021) --Strict I/O's & daily weights  Hypotension - BP improved with holding cardiac meds --maintain MAP>65 --started on low dose midodrine --otherwise mgmt as outlined, per cardiology  Digoxin Toxicity Bradycardia --hold digoxin and amiodarone --telemetry monitoring --cardiology recommending against resuming digoxin long term   CAD with hx/o PCI --Continue Brilinta, Lipitor, Zetia  Acute urinary retention with hx of BPH - Foley placed in ED discontinued for voiding trials.  Flomax started.  Needs urology follow up outpatient.  Monitor with bladder scans.   Acute on CKD stage IIIB - AKI POA with Cr 3.85 AKI improving. Cr 3.85>>2.84 --hold cardiac meds for AKI and low BP's --IVF bolus given in ED and Foley placed by EMS Cr  improved to 3.10 (8/14) --Low EF, will encouraged PO hydration --IV fluids only if needed to maintain MAP>65 --Avoid nephrotoxic meds --Renal ultrasound negative for obstruction   Elevated troponin - unlikely ACS, trend flat, no chest pain or significant acute ischemic changes on EKG.  Likely demand ischemia  Prolonged Qtc - 502 ms on EKG.   --Avoid QT prolonging meds --Telemetry --Maintain K>4, Mg>2  Candidal intertrigo - unable to give Diflucan with prolonged QT interval.   --Topical clotrimazole BID x 2-4 weeks --Monitor closely  Prediabetes - A1c 5.9% on 04/29/20.  On Jardiance for CHF. Hypoglycemia - fasting glucose 51-77 here.   --need to discuss risks/benefits of Jardiance   Hypothyroidism - continue Synthroid  Vitamin d deficiency - continue supplement  HLD - continue Lipitor    Patient BMI: Body mass index is 30.92 kg/m.   DVT prophylaxis: heparin injection 5,000 Units Start: 08/31/20 1800 SCDs Start: 08/31/20 1557   Diet:  Diet Orders (From admission, onward)     Start     Ordered   08/31/20 1558  Diet 2 gram sodium Room service appropriate? Yes; Fluid consistency: Thin  Diet effective now       Question Answer Comment  Room service appropriate? Yes   Fluid consistency: Thin      08/31/20 1559              Code Status: Full Code   Brief Narrative / Hospital Course to Date:   Dorothy Landgrebe III is a 75 y.o. male with medical history significant of ischemic cardiomyopathy, chronic combined systolic and diastolic heart failure, status post STEMI, status post AICD, dyslipidemia, CAD, V. fib, chronic  LBBB, CAD status post RCA PCI as well as PTCA on Brilinta presents after sustaining 2 falls.  EMS was called and patient found to be hypotensive and bradycardic.  Also with urinary retention with Foley placed with large urine return.  BP improved with IV hydration. Has significant AKI with Cr 3.85 on admission. Cardiology is consulted and  following.  Subjective 09/03/20    Patient says he's feeling well and could be doing much better if he were at home.  Doesn't provide specific symptomatic complaints.  Repeatedly requests to be discharged TODAY.  Discussed the multiple medical reasons he is not ready, but he feels everything can be managed outpatient.  Feels he is strong enough to return home.  SNF recommended by PT.      Disposition Plan & Communication   Status is: Inpatient  Remains inpatient appropriate because:Inpatient level of care appropriate due to severity of illness and ongoing diagnostic evaluation  Dispo: The patient is from: Home              Anticipated d/c is to: SNF              Patient currently is not medically stable to d/c.   Difficult to place patient No   Family Communication: Wife updated by phone this afternoon.    Consults, Procedures, Significant Events   Consultants:  Cardiology  Procedures:  None  Antimicrobials:  Anti-infectives (From admission, onward)    None         Micro    Objective   Vitals:   09/02/20 2000 09/03/20 0300 09/03/20 0509 09/03/20 1116  BP: 96/68  95/63 95/65  Pulse: 64  70 73  Resp: 20  18 19   Temp: 98.1 F (36.7 C)  (!) 97.1 F (36.2 C) 97.6 F (36.4 C)  TempSrc: Oral  Oral Oral  SpO2: 93%  97% 97%  Weight:  106.3 kg    Height:        Intake/Output Summary (Last 24 hours) at 09/03/2020 1915 Last data filed at 09/03/2020 1322 Gross per 24 hour  Intake 236 ml  Output 470 ml  Net -234 ml   Filed Weights   08/31/20 1254 09/02/20 0451 09/03/20 0300  Weight: 102.1 kg 105.3 kg 106.3 kg    Physical Exam:  General exam: awake, appears drowsy, no acute distress Respiratory system: CTAB, with diminished bases, normal respiratory effort. Cardiovascular system: normal S1/S2, RRR, trace-1+ lower extremity edema  Central nervous system: A&O. no gross focal neurologic deficits, normal speech Extremities: pedal edema, stable erythema of b/l  feet without differential warmth Psych:normal mood, congruent affect    Labs   Data Reviewed: I have personally reviewed following labs and imaging studies  CBC: Recent Labs  Lab 08/31/20 1310 08/31/20 1354 09/01/20 1529 09/02/20 0351 09/03/20 0414  WBC 15.8*  --  15.8* 12.8* 11.3*  NEUTROABS 13.6*  --   --   --   --   HGB 14.3 15.6 13.4 12.5* 12.6*  HCT 46.1 46.0 41.8 39.9 40.0  MCV 86.5  --  85.3 85.4 85.3  PLT 309  --  283 297 935   Basic Metabolic Panel: Recent Labs  Lab 08/31/20 1310 08/31/20 1354 09/01/20 1529 09/02/20 0351 09/03/20 0414  NA 130* 131* 134* 134* 130*  K 5.0 4.9 4.9 4.3 4.2  CL 98 101 101 101 98  CO2 18*  --  21* 22 20*  GLUCOSE 57* 51* 66* 77 115*  BUN 65* 60* 59* 55* 54*  CREATININE 3.85* 3.70* 3.10* 2.84* 2.83*  CALCIUM 9.2  --  9.0 8.7* 8.9  MG 2.4  --   --  2.5*  --    GFR: Estimated Creatinine Clearance: 28 mL/min (A) (by C-G formula based on SCr of 2.83 mg/dL (H)). Liver Function Tests: Recent Labs  Lab 08/31/20 1310 09/02/20 0351 09/03/20 0414  AST 42* 44* 34  ALT 29 26 26   ALKPHOS 90 75 78  BILITOT 2.2* 1.7* 1.5*  PROT 6.2* 5.2* 5.5*  ALBUMIN 3.7 2.8* 3.0*   No results for input(s): LIPASE, AMYLASE in the last 168 hours. No results for input(s): AMMONIA in the last 168 hours. Coagulation Profile: No results for input(s): INR, PROTIME in the last 168 hours. Cardiac Enzymes: No results for input(s): CKTOTAL, CKMB, CKMBINDEX, TROPONINI in the last 168 hours. BNP (last 3 results) No results for input(s): PROBNP in the last 8760 hours. HbA1C: No results for input(s): HGBA1C in the last 72 hours. CBG: Recent Labs  Lab 09/01/20 2124  GLUCAP 134*   Lipid Profile: No results for input(s): CHOL, HDL, LDLCALC, TRIG, CHOLHDL, LDLDIRECT in the last 72 hours. Thyroid Function Tests: No results for input(s): TSH, T4TOTAL, FREET4, T3FREE, THYROIDAB in the last 72 hours.  Anemia Panel: No results for input(s): VITAMINB12,  FOLATE, FERRITIN, TIBC, IRON, RETICCTPCT in the last 72 hours. Sepsis Labs: Recent Labs  Lab 09/01/20 1529  PROCALCITON 0.12    Recent Results (from the past 240 hour(s))  SARS CORONAVIRUS 2 (TAT 6-24 HRS) Nasopharyngeal Nasopharyngeal Swab     Status: None   Collection Time: 08/31/20  7:22 PM   Specimen: Nasopharyngeal Swab  Result Value Ref Range Status   SARS Coronavirus 2 NEGATIVE NEGATIVE Final    Comment: (NOTE) SARS-CoV-2 target nucleic acids are NOT DETECTED.  The SARS-CoV-2 RNA is generally detectable in upper and lower respiratory specimens during the acute phase of infection. Negative results do not preclude SARS-CoV-2 infection, do not rule out co-infections with other pathogens, and should not be used as the sole basis for treatment or other patient management decisions. Negative results must be combined with clinical observations, patient history, and epidemiological information. The expected result is Negative.  Fact Sheet for Patients: SugarRoll.be  Fact Sheet for Healthcare Providers: https://www.woods-mathews.com/  This test is not yet approved or cleared by the [place] FDA and  has been authorized for detection and/or diagnosis of SARS-CoV-2 by FDA under an Emergency Use Authorization (EUA). This EUA will remain  in effect (meaning this test can be used) for the duration of the COVID-19 declaration under Se ction 564(b)(1) of the Act, 21 U.S.C. section 360bbb-3(b)(1), unless the authorization is terminated or revoked sooner.  Performed at [facility], [place] [address], [place], [place] [address]       Imaging Studies   No results found.   Medications   Scheduled Meds:  atorvastatin  80 mg Oral Daily   cholecalciferol  1,000 Units Oral Daily   clotrimazole   Topical BID   ezetimibe  10 mg Oral Daily   folic acid  1 mg Oral Daily   heparin  5,000 Units Subcutaneous Q8H   levothyroxine  125  mcg Oral Daily   [START ON 09/04/2020] midodrine  5 mg Oral TID WC   multivitamin with minerals  1 tablet Oral Daily   tamsulosin  0.4 mg Oral QPC breakfast   thiamine  100 mg Oral Daily   ticagrelor  60 mg Oral BID   Continuous  Infusions:     LOS: 3 days    Time spent: 30 minutes with > 50% spent at bedside and in coordination of care.    Ezekiel Slocumb, DO [facility]  09/03/2020, 7:15 PM      If 7PM-7AM, please contact night-coverage. How to contact the [facility] Attending or Consulting provider [place] or covering provider during after hours [place], for this patient?    Check the care team in [facility] and look for a) attending/consulting TRH provider listed and b) the [facility] team listed Log into [email or web address] and use [place]'s universal password to access. If you do not have the password, please contact the hospital operator. Locate the [facility] provider you are looking for under [facility] and page to a number that you can be directly reached. If you still have difficulty reaching the provider, please page the [facility] (Director on Call) for the Hospitalists listed on amion for assistance.  PROGRESS NOTE    Leonard [name] III   UMP:[id number]  DOB: [birth year]  PCP: Girtha Rm, NP-C    DOA: 08/31/2020 LOS: 3   Assessment & Plan   Principal Problem:   Hypotension Active Problems:   Acute on chronic combined systolic and diastolic CHF (congestive heart failure) (HCC)   Bradycardia   Multiple falls   Essential hypertension   Hyperlipidemia LDL goal <70   Coronary artery disease involving native coronary artery without angina pectoris   Generalized weakness   CKD (chronic kidney disease) stage 3, GFR 30-59 ml/min (HCC)   Hypothyroid   Intertrigo   Mechanical fall -x2 -  likely related to hypotension and bradycardia with resulting generalized weakness. Pt also was not using his walker. --PT/OT evaluations pending --Hold cardiac meds as bp low --Hold  amiodarone, digoxin due to bradycardia   Acute on Chronic combined systolic/diastolic CHF Ischemic cardiomyopathy - presenting w/hypotension and AKI. Status post ICD Hx of ventricular tachycardia --Cardiology following, appreciate recs --Hold Entresto, Jardiance, Aldactone, Demadex, digoxin --Resume meds cautiously as BP tolerates, per cards --Follow up Echo (last EF 15-20% in 2021) --Strict I/O's & daily weights  Hypotension - BP improved with holding cardiac meds --maintain MAP>65 --otherwise mgmt as outlined, per cardiology  Digoxin Toxicity Bradycardia --hold digoxin and amiodarone --telemetry monitoring --cardiology recommending against resuming digoxin long term   CAD with hx/o PCI --Continue Brilinta, Lipitor, Zetia   Acute on CKD stage IIIB - AKI POA with Cr 3.85 AKI improving. Cr 3.85>>2.84 --hold cardiac meds for AKI and low BP's --IVF bolus given in ED and Foley placed by EMS Cr improved to 3.10 (8/14) --Low EF, will encouraged PO hydration --IV fluids only if needed to maintain MAP>65 --Avoid nephrotoxic meds --Renal ultrasound negative for obstruction   Elevated troponin - unlikely ACS, trend flat, no chest pain or significant acute ischemic changes on EKG.  Likely demand ischemia  Prolonged Qtc - 502 ms on EKG.   --Avoid QT prolonging meds --Telemetry --Maintain K>4, Mg>2  Candidal intertrigo - unable to give Diflucan with prolonged QT interval.   --Topical clotrimazole BID x 2-4 weeks --Monitor closely  Prediabetes - A1c 5.9% on 04/29/20.  On Jardiance for CHF. Hypoglycemia - fasting glucose 51-77 here.   --need to discuss risks/benefits of Jardiance   Hypothyroidism - continue Synthroid  Vitamin d deficiency - continue supplement  HLD - continue Lipitor    Patient BMI: Body mass index is 30.92 kg/m.   DVT prophylaxis: heparin injection 5,000 Units Start: 08/31/20 1800 SCDs Start:  08/31/20 1557   Diet:  Diet Orders (From admission,  onward)     Start     Ordered   08/31/20 1558  Diet 2 gram sodium Room service appropriate? Yes; Fluid consistency: Thin  Diet effective now       Question Answer Comment  Room service appropriate? Yes   Fluid consistency: Thin      08/31/20 1559              Code Status: Full Code   Brief Narrative / Hospital Course to Date:   Leonard [name] III is a 75 y.o. male with medical history significant of ischemic cardiomyopathy, chronic combined systolic and diastolic heart failure, status post STEMI, status post AICD, dyslipidemia, CAD, V. fib, chronic LBBB, CAD status post RCA PCI as well as PTCA on Brilinta presents after sustaining 2 falls.  EMS was called and patient found to be hypotensive and bradycardic.  Also with urinary retention with Foley placed with large urine return.  BP improved with IV hydration. Has significant AKI with Cr 3.85 on admission. Cardiology is consulted and following.  Subjective 09/03/20    Patient awake resting in bed when seen this morning on rounds.  He says he is feeling much better today.  Really wanting to go home.  Reports he was frustrated that he did not feel like he did very well up with therapy and using the walker.  States he does much better with his walker at home where he is familiar with the environment.   Disposition Plan & Communication   Status is: Inpatient  Remains inpatient appropriate because:Inpatient level of care appropriate due to severity of illness and ongoing diagnostic evaluation  Dispo: The patient is from: Home              Anticipated d/c is to: Home              Patient currently is not medically stable to d/c.   Difficult to place patient No   Family Communication: Wife at bedside on rounds 8/14.  Unable to reach wife by phone for update on 8/15.   Consults, Procedures, Significant Events   Consultants:  Cardiology  Procedures:  None  Antimicrobials:  Anti-infectives (From admission, onward)     None         Micro    Objective   Vitals:   09/02/20 2000 09/03/20 0300 09/03/20 0509 09/03/20 1116  BP: 96/68  95/63 95/65  Pulse: 64  70 73  Resp: 20  18 19   Temp: 98.1 F (36.7 C)  (!) 97.1 F (36.2 C) 97.6 F (36.4 C)  TempSrc: Oral  Oral Oral  SpO2: 93%  97% 97%  Weight:  106.3 kg    Height:        Intake/Output Summary (Last 24 hours) at 09/03/2020 1601 Last data filed at 09/03/2020 1322 Gross per 24 hour  Intake 236 ml  Output 470 ml  Net -234 ml   Filed Weights   08/31/20 1254 09/02/20 0451 09/03/20 0300  Weight: 102.1 kg 105.3 kg 106.3 kg    Physical Exam:  General exam: awake, appears drowsy, no acute distress Respiratory system: CTAB, with diminished bases, normal respiratory effort. Cardiovascular system: normal S1/S2, RRR, trace-1+ lower extremity edema  Gastrointestinal system: soft, NT, ND,  Central nervous system: A&O. no gross focal neurologic deficits, normal speech Psych:normal mood, congruent affect    Labs   Data Reviewed: I have personally reviewed following labs and imaging studies  CBC: Recent Labs  Lab 08/31/20 1310 08/31/20 1354 09/01/20 1529 09/02/20 0351 09/03/20 0414  WBC 15.8*  --  15.8* 12.8* 11.3*  NEUTROABS 13.6*  --   --   --   --   HGB 14.3 15.6 13.4 12.5* 12.6*  HCT 46.1 46.0 41.8 39.9 40.0  MCV 86.5  --  85.3 85.4 85.3  PLT 309  --  283 297 007   Basic Metabolic Panel: Recent Labs  Lab 08/31/20 1310 08/31/20 1354 09/01/20 1529 09/02/20 0351 09/03/20 0414  NA 130* 131* 134* 134* 130*  K 5.0 4.9 4.9 4.3 4.2  CL 98 101 101 101 98  CO2 18*  --  21* 22 20*  GLUCOSE 57* 51* 66* 77 115*  BUN 65* 60* 59* 55* 54*  CREATININE 3.85* 3.70* 3.10* 2.84* 2.83*  CALCIUM 9.2  --  9.0 8.7* 8.9  MG 2.4  --   --  2.5*  --    GFR: Estimated Creatinine Clearance: 28 mL/min (A) (by C-G formula based on SCr of 2.83 mg/dL (H)). Liver Function Tests: Recent Labs  Lab 08/31/20 1310 09/02/20 0351 09/03/20 0414  AST  42* 44* 34  ALT 29 26 26   ALKPHOS 90 75 78  BILITOT 2.2* 1.7* 1.5*  PROT 6.2* 5.2* 5.5*  ALBUMIN 3.7 2.8* 3.0*   No results for input(s): LIPASE, AMYLASE in the last 168 hours. No results for input(s): AMMONIA in the last 168 hours. Coagulation Profile: No results for input(s): INR, PROTIME in the last 168 hours. Cardiac Enzymes: No results for input(s): CKTOTAL, CKMB, CKMBINDEX, TROPONINI in the last 168 hours. BNP (last 3 results) No results for input(s): PROBNP in the last 8760 hours. HbA1C: No results for input(s): HGBA1C in the last 72 hours. CBG: Recent Labs  Lab 09/01/20 2124  GLUCAP 134*   Lipid Profile: No results for input(s): CHOL, HDL, LDLCALC, TRIG, CHOLHDL, LDLDIRECT in the last 72 hours. Thyroid Function Tests: No results for input(s): TSH, T4TOTAL, FREET4, T3FREE, THYROIDAB in the last 72 hours.  Anemia Panel: No results for input(s): VITAMINB12, FOLATE, FERRITIN, TIBC, IRON, RETICCTPCT in the last 72 hours. Sepsis Labs: Recent Labs  Lab 09/01/20 1529  PROCALCITON 0.12    Recent Results (from the past 240 hour(s))  SARS CORONAVIRUS 2 (TAT 6-24 HRS) Nasopharyngeal Nasopharyngeal Swab     Status: None   Collection Time: 08/31/20  7:22 PM   Specimen: Nasopharyngeal Swab  Result Value Ref Range Status   SARS Coronavirus 2 NEGATIVE NEGATIVE Final    Comment: (NOTE) SARS-CoV-2 target nucleic acids are NOT DETECTED.  The SARS-CoV-2 RNA is generally detectable in upper and lower respiratory specimens during the acute phase of infection. Negative results do not preclude SARS-CoV-2 infection, do not rule out co-infections with other pathogens, and should not be used as the sole basis for treatment or other patient management decisions. Negative results must be combined with clinical observations, patient history, and epidemiological information. The expected result is Negative.  Fact Sheet for Patients: SugarRoll.be  Fact  Sheet for Healthcare Providers: https://www.woods-mathews.com/  This test is not yet approved or cleared by the [place] FDA and  has been authorized for detection and/or diagnosis of SARS-CoV-2 by FDA under an Emergency Use Authorization (EUA). This EUA will remain  in effect (meaning this test can be used) for the duration of the COVID-19 declaration under Se ction 564(b)(1) of the Act, 21 U.S.C. section 360bbb-3(b)(1), unless the authorization is terminated or revoked sooner.  Performed at [facility], [place] [address] [place], [place] [address]       Imaging Studies   No results found.   Medications   Scheduled Meds:  atorvastatin  80 mg Oral Daily   cholecalciferol  1,000 Units Oral Daily   clotrimazole   Topical BID   ezetimibe  10 mg Oral Daily   folic acid  1 mg Oral Daily   heparin  5,000 Units Subcutaneous Q8H   levothyroxine  125 mcg Oral Daily   midodrine  2.5 mg Oral TID WC   multivitamin with minerals  1 tablet Oral Daily   tamsulosin  0.4 mg Oral QPC breakfast   thiamine  100 mg Oral Daily   ticagrelor  60 mg Oral BID   Continuous Infusions:     LOS: 3 days    Time spent: 30 minutes with > 50% spent at bedside and in coordination of care.    Ezekiel Slocumb, DO [facility]  09/03/2020, 4:01 PM      If 7PM-7AM, please contact night-coverage. How to contact the [facility] Attending or Consulting provider [place] or covering provider during after hours [place], for this patient?    Check the care team in [facility] and look for a) attending/consulting TRH provider listed and b) the [facility] team listed Log into [email or web address] and use [place]'s universal password to access. If you do not have the password, please contact the hospital operator. Locate the [facility] provider you are looking for under [facility] and page to a number that you can be directly reached. If you still have difficulty reaching the provider, please page the  [facility] (Director on Call) for the Hospitalists listed on amion for assistance.

## 2020-09-03 NOTE — Progress Notes (Signed)
Heart Failure Stewardship Pharmacist Progress Note   PCP: Girtha Rm, NP-C PCP-Cardiologist: Glenetta Hew, MD    HPI:  75 yo M with PMH of CAD, mixed ICM/NICM, ICD (implanted 08/2015), HTN, CHF, HLD, and CKD III. He presented to the ED on 8/13 following a fall due to hypotension. CXR on admission with mild left basilar atelectasis and small left pleural effusion. An ECHO was done on 8/14 and LVEF was <20% (was 15-20% in Nov 2020 and Jan 2019; 20-25% in May 2018) with G3DD and moderately reduced RV systolic function. Cardiac cath in 2018 showed nonobstructive CAD with patent stent in RCA.   Current HF Medications: None (on midodrine 2.5 mg TID)  Prior to admission HF Medications: Torsemide 20 mg daily Entresto 24/26 mg BID Spironolactone 12.5 mg daily Jardiance 10 mg daily Digoxin 0.125 mg daily  Pertinent Lab Values: Serum creatinine 2.83 (baseline ~1.8-2), BUN 54, Potassium 4.2, Sodium 130, BNP 4467.9, Magnesium 2.5  Digoxin 2.9 (8/13)  Vital Signs: Weight: 234 lbs (admission weight: 232 lbs) Blood pressure: 90/60s  Heart rate: 60s   Medication Assistance / Insurance Benefits Check: Does the patient have prescription insurance?  Yes Type of insurance plan: Medicare + [place]:  Prior to admission outpatient pharmacy: [facility] Is the patient willing to use [facility] at discharge? Yes Is the patient willing to transition their outpatient pharmacy to utilize a [facility] outpatient pharmacy?   Pending    Assessment: 1. Acute on chronic systolic CHF (EF <40%), due to mixed ICM/NICM. NYHA class II symptoms. SCr continues to improve. Last known baseline ~1.8.  - Continue midodrine 2.5 mg TID - Holding all CHF medications with hypotension - Consider resuming Jardiance 10 mg daily once renal function allows - Digoxin on hold. Initial level 2.9 (collected 8/13 @ 1310 - last dose per patient was taken 8/12). No digifab given asymptomatic.     Plan: 1) Medication changes recommended at this time: - Continue current regimen; BP and renal dysfunction limiting GDMT  2) Patient assistance: - Last seen in CHF clinic by Dr. Haroldine Laws in June 2018 - Appt scheduled for post-hospital follow up with AHF NP on 8/23  3)  Education  - To be completed prior to discharge  Kerby Nora, PharmD, BCPS Heart Failure Stewardship Pharmacist Phone [phone]

## 2020-09-03 NOTE — Progress Notes (Signed)
Progress Note  Patient Name: Leonard [name] Date of Encounter: 09/03/2020  [facility] Cardiologist: Glenetta Hew, MD   Subjective   No CP or dyspnea  Inpatient Medications    Scheduled Meds:  atorvastatin  80 mg Oral Daily   cholecalciferol  1,000 Units Oral Daily   clotrimazole   Topical BID   ezetimibe  10 mg Oral Daily   folic acid  1 mg Oral Daily   heparin  5,000 Units Subcutaneous Q8H   levothyroxine  125 mcg Oral Daily   midodrine  2.5 mg Oral TID WC   multivitamin with minerals  1 tablet Oral Daily   tamsulosin  0.4 mg Oral QPC breakfast   thiamine  100 mg Oral Daily   ticagrelor  60 mg Oral BID   Continuous Infusions:  PRN Meds: acetaminophen   Vital Signs    Vitals:   09/02/20 1708 09/02/20 2000 09/03/20 0300 09/03/20 0509  BP: 98/65 96/68  95/63  Pulse: 70 64  70  Resp:  20  18  Temp: 97.7 F (36.5 C) 98.1 F (36.7 C)  (!) 97.1 F (36.2 C)  TempSrc: Oral Oral  Oral  SpO2: 100% 93%  97%  Weight:   106.3 kg   Height:        Intake/Output Summary (Last 24 hours) at 09/03/2020 0845 Last data filed at 09/03/2020 0300 Gross per 24 hour  Intake 580 ml  Output 470 ml  Net 110 ml    Last 3 Weights 09/03/2020 09/02/2020 08/31/2020  Weight (lbs) 234 lb 5.6 oz 232 lb 2.3 oz 225 lb  Weight (kg) 106.3 kg 105.3 kg 102.059 kg      Telemetry    Sinus with ventricular pacing- Personally Reviewed  Physical Exam   GEN: NAD Neck: JVP 10 cm Cardiac: RRR Respiratory: CTA; no wheeze GI: Soft, NT/ND, no masses MS: Trace to 1+ ankle edema Neuro:  No focal findings Psych: Normal affect   Labs    High Sensitivity Troponin:   Recent Labs  Lab 08/31/20 1310 08/31/20 1430  TROPONINIHS 75* 65*       Chemistry Recent Labs  Lab 08/31/20 1310 08/31/20 1354 09/01/20 1529 09/02/20 0351 09/03/20 0414  NA 130*   < > 134* 134* 130*  K 5.0   < > 4.9 4.3 4.2  CL 98   < > 101 101 98  CO2 18*  --  21* 22 20*  GLUCOSE 57*   < > 66* 77 115*   BUN 65*   < > 59* 55* 54*  CREATININE 3.85*   < > 3.10* 2.84* 2.83*  CALCIUM 9.2  --  9.0 8.7* 8.9  PROT 6.2*  --   --  5.2* 5.5*  ALBUMIN 3.7  --   --  2.8* 3.0*  AST 42*  --   --  44* 34  ALT 29  --   --  26 26  ALKPHOS 90  --   --  75 78  BILITOT 2.2*  --   --  1.7* 1.5*  GFRNONAA 15*  --  20* 22* 22*  ANIONGAP 14  --  12 11 12    < > = values in this interval not displayed.      Hematology Recent Labs  Lab 09/01/20 1529 09/02/20 0351 09/03/20 0414  WBC 15.8* 12.8* 11.3*  RBC 4.90 4.67 4.69  HGB 13.4 12.5* 12.6*  HCT 41.8 39.9 40.0  MCV 85.3 85.4 85.3  MCH 27.3 26.8 26.9  MCHC 32.1 31.3 31.5  RDW 17.1* 17.2* 17.2*  PLT 283 297 279     BNP Recent Labs  Lab 08/31/20 1310  BNP 4,467.9*       Radiology    ECHOCARDIOGRAM COMPLETE  Result Date: 09/01/2020    ECHOCARDIOGRAM REPORT   Patient Name:   Leonard [name] Date of Exam: 09/01/2020 Medical Rec #:  [id number]           Height:       73.0 in Accession #:    [id number]          Weight:       225.0 lb Date of Birth:  March 30, 1945            BSA:          2.262 m Patient Age:    75 years            BP:           117/76 mmHg Patient Gender: M                   HR:           57 bpm. Exam Location:  Inpatient Procedure: 2D Echo Indications:    acute systolic chf  History:        Patient has prior history of Echocardiogram examinations, most                 recent 12/18/2018. Cardiomyopathy, CAD, Defibrillator, chronic                 kidney disease; Risk Factors:Hypertension and Dyslipidemia.  Sonographer:    Johny Chess RDCS Referring Phys: [id number] [place]  1. Global hypokinesis with akinesis of the inferior and inferolateral walls; overall sevrere LV dysfunction.  2. Left ventricular ejection fraction, by estimation, is <20%. The left ventricle has severely decreased function. The left ventricle demonstrates regional wall motion abnormalities (see scoring diagram/findings for description). The left  ventricular internal cavity size was moderately dilated. There is mild left ventricular hypertrophy. Left ventricular diastolic parameters are consistent with Grade [name] diastolic dysfunction (restrictive).  3. Right ventricular systolic function is moderately reduced. The right ventricular size is normal. There is moderately elevated pulmonary artery systolic pressure.  4. Left atrial size was severely dilated.  5. Right atrial size was severely dilated.  6. Moderate pleural effusion in the left lateral region.  7. The mitral valve is normal in structure. Moderate mitral valve regurgitation. No evidence of mitral stenosis.  8. Tricuspid valve regurgitation is moderate.  9. The aortic valve is tricuspid. Aortic valve regurgitation is mild. Mild to moderate aortic valve sclerosis/calcification is present, without any evidence of aortic stenosis. 10. The inferior vena cava is dilated in size with >50% respiratory variability, suggesting right atrial pressure of 8 mmHg. FINDINGS  Left Ventricle: Left ventricular ejection fraction, by estimation, is <20%. The left ventricle has severely decreased function. The left ventricle demonstrates regional wall motion abnormalities. The left ventricular internal cavity size was moderately dilated. There is mild left ventricular hypertrophy. Left ventricular diastolic parameters are consistent with Grade [name] diastolic dysfunction (restrictive). Right Ventricle: The right ventricular size is normal. Right ventricular systolic function is moderately reduced. There is moderately elevated pulmonary artery systolic pressure. The tricuspid regurgitant velocity is 3.16 m/s, and with an assumed right atrial pressure of 8 mmHg, the estimated right ventricular systolic pressure is 65.7 mmHg. Left Atrium: Left atrial size was severely dilated. Right Atrium: Right  atrial size was severely dilated. Pericardium: There is no evidence of pericardial effusion. Mitral Valve: The mitral valve is  normal in structure. Moderate mitral valve regurgitation. No evidence of mitral valve stenosis. Tricuspid Valve: The tricuspid valve is normal in structure. Tricuspid valve regurgitation is moderate . No evidence of tricuspid stenosis. Aortic Valve: The aortic valve is tricuspid. Aortic valve regurgitation is mild. Mild to moderate aortic valve sclerosis/calcification is present, without any evidence of aortic stenosis. Pulmonic Valve: The pulmonic valve was normal in structure. Pulmonic valve regurgitation is not visualized. No evidence of pulmonic stenosis. Aorta: The aortic root is normal in size and structure. Venous: The inferior vena cava is dilated in size with greater than 50% respiratory variability, suggesting right atrial pressure of 8 mmHg. IAS/Shunts: No atrial level shunt detected by color flow Doppler. Additional Comments: Global hypokinesis with akinesis of the inferior and inferolateral walls; overall sevrere LV dysfunction. A device lead is visualized. There is a moderate pleural effusion in the left lateral region.  LEFT VENTRICLE PLAX 2D LVIDd:         6.00 cm      Diastology LVIDs:         5.50 cm      LV e' medial: 3.92 cm/s LV PW:         1.20 cm LV IVS:        1.30 cm LVOT diam:     2.10 cm LV SV:         44 LV SV Index:   19 LVOT Area:     3.46 cm  LV Volumes (MOD) LV vol d, MOD A4C: 229.0 ml LV vol s, MOD A4C: 177.0 ml LV SV MOD A4C:     229.0 ml RIGHT VENTRICLE         IVC TAPSE (M-mode): 1.5 cm  IVC diam: 2.30 cm LEFT ATRIUM              Index       RIGHT ATRIUM           Index LA diam:        4.70 cm  2.08 cm/m  RA Area:     31.10 cm LA Vol (A2C):   162.0 ml 71.62 ml/m RA Volume:   120.00 ml 53.05 ml/m LA Vol (A4C):   138.0 ml 61.01 ml/m LA Biplane Vol: 156.0 ml 68.96 ml/m  AORTIC VALVE LVOT Vmax:   69.50 cm/s LVOT Vmean:  44.700 cm/s LVOT VTI:    0.126 m  AORTA Ao Root diam: 3.30 cm Ao Asc diam:  3.40 cm MR Peak grad:   77.4 mmHg   TRICUSPID VALVE MR Mean grad:   44.0 mmHg   TR  Peak grad:   39.9 mmHg MR Vmax:        440.00 cm/s TR Vmax:        316.00 cm/s MR Vmean:       311.0 cm/s MR PISA:        1.01 cm    SHUNTS MR PISA Radius: 0.40 cm     Systemic VTI:  0.13 m                             Systemic Diam: 2.10 cm Kirk Ruths MD Electronically signed by Kirk Ruths MD Signature Date/Time: 09/01/2020/11:20:34 AM    Final      Patient Profile     75 y.o. male with past medical  history of coronary artery disease, mixed ischemic/nonischemic cardiomyopathy, prior ICD, chronic combined systolic/diastolic congestive heart failure, hypertension, hyperlipidemia, chronic stage [name] kidney disease who is admitted after falling and noted to have hypotension.  Cardiology was asked to evaluate for hypotension and acute on chronic combined systolic/diastolic congestive heart failure. Cardiac catheterization May 2018 showed nonobstructive coronary disease with patent stent in the right coronary artery.  Echocardiogram this admission showed ejection fraction less than 20%, moderate left ventricular enlargement, restrictive filling, moderate RV dysfunction, moderate pulmonary hypertension, severe biatrial enlargement, moderate mitral regurgitation, moderate tricuspid regurgitation, mild aortic insufficiency.  Assessment & Plan    Acute on chronic systolic congestive heart failure-patient presented with mechanical falls x2 and was noted to be hypotensive with acute renal insufficiency.  Blood pressure has improved but remains borderline.  Renal function slowly trending down but not at baseline.  Patient wishes to be discharged.  He remains asymptomatic.  Would continue to hold diuretics today and tomorrow and resume Demadex 20 mg daily the following day.  Continue off of spironolactone for now. Elevated digoxin level-initial digoxin level was 2.9 likely due to acute on chronic renal insufficiency as well as amiodarone interaction.  Digoxin discontinued.  Would not resume. History of mixed  ischemic/nonischemic cardiomyopathy-echocardiogram shows severe LV dysfunction.  Cardiac medications on hold due to hypotension at time of admission and acute renal failure.  Systolic blood pressure remains 95.  I will arrange follow-up in the CHF clinic to resume low-dose medications as tolerated following discharge. Acute on chronic stage [name] kidney disease-creatinine slowly improving but not at baseline.  We will continue to hold all medications as outlined above.  We will plan to check potassium and renal function when he follows up in CHF clinic. History of ventricular tachycardia-would resume amiodarone 100 mg daily at discharge. Coronary artery disease-continue brilinta and statin. Minimally elevated troponin-no clear trend, no chest pain and occurring in the setting of acute renal insufficiency.  No plans for further ischemia evaluation. S/P ICD  Patient requests to be discharged.  He remains asymptomatic, systolic blood pressure now greater than 90 and renal function slowly improving.  He also has not significantly volume overloaded on examination.  As outlined above would continue to hold diuretics today and tomorrow and resume Demadex 20 mg daily the following day.  Would not resume spironolactone, Entresto, digoxin or Jardiance at this point.  I will arrange close follow-up in the CHF clinic so lower dose medications can be initiated as tolerated.  We will check potassium and renal function at that time.  He can be discharged on present medications with resumption of Demadex as outlined.  For questions or updates, please contact [place] Please consult [email or web address] for contact info under        Signed, Kirk Ruths, MD  09/03/2020, 8:45 AM

## 2020-09-03 NOTE — Progress Notes (Signed)
Pt attempted to void twice just had 20 ml bladder scan accordingly show 390 ml. In and out was done drained 450 ml. Will monitor.

## 2020-09-03 NOTE — TOC Progression Note (Signed)
Transition of Care [facility]) - Progression Note    Patient Details  Name: Leonard [name] MRN: [id number] Date of Birth: 1945/04/26  Transition [facility]) CM/SW Contact  Zenon Mayo, RN Phone Number: 09/03/2020, 3:14 PM  Clinical Narrative:    NCM spoke with patient at bedside, he stated to call his wife at [phone].  This NCM contacted wife, she states if they go with [facility] services they would like [place], but she states she wants him to go to a SNF where he will be safe.  She states she works and he is not able to ambulate.  She states she has been looking into some private duty help, but she is going to have another talk with him to get him to do the safest thing for him to go to a SNF to get at least 2 weeks of rehab, then go home with [facility].  This NCM informed her that he is capable of making his decisions so we will have to go with he wants, she states she understands.  This NCM will make referral to [facility] with [place].   Expected Discharge Plan: [place] Barriers to Discharge: Continued Medical Work up  Expected Discharge Plan and Services Expected Discharge Plan: [place]   Discharge Planning Services: CM Consult   Living arrangements for the past 2 months: Single Family Home                                       Social Determinants of Health (SDOH) Interventions    Readmission Risk Interventions No flowsheet data found.

## 2020-09-03 NOTE — Progress Notes (Signed)
Heart Failure Nurse Navigator Progress Note  Attempted to complete SDoH with patient. Gave patient appointment card for AHF APP clinic 8/23. Pt states spouse will drive him.   Gave brief HF education.   Noted pt has legal guardian per chart.   Pricilla Holm, RN, BSN Heart Failure Nurse Navigator [phone]

## 2020-09-04 DIAGNOSIS — I9589 Other hypotension: Secondary | ICD-10-CM | POA: Diagnosis not present

## 2020-09-04 DIAGNOSIS — I5043 Acute on chronic combined systolic (congestive) and diastolic (congestive) heart failure: Secondary | ICD-10-CM | POA: Diagnosis not present

## 2020-09-04 LAB — BASIC METABOLIC PANEL
Anion gap: 8 (ref 5–15)
BUN: 55 mg/dL — ABNORMAL HIGH (ref 8–23)
CO2: 22 mmol/L (ref 22–32)
Calcium: 8.9 mg/dL (ref 8.9–10.3)
Chloride: 101 mmol/L (ref 98–111)
Creatinine, Ser: 2.75 mg/dL — ABNORMAL HIGH (ref 0.61–1.24)
GFR, Estimated: 23 mL/min — ABNORMAL LOW (ref >60)
Glucose, Bld: 112 mg/dL — ABNORMAL HIGH (ref 70–99)
Potassium: 4.2 mmol/L (ref 3.5–5.1)
Sodium: 131 mmol/L — ABNORMAL LOW (ref 135–145)

## 2020-09-04 LAB — CBC
HCT: 40.1 % (ref 39.0–52.0)
Hemoglobin: 12.8 g/dL — ABNORMAL LOW (ref 13.0–17.0)
MCH: 27.1 pg (ref 26.0–34.0)
MCHC: 31.9 g/dL (ref 30.0–36.0)
MCV: 85 fL (ref 80.0–100.0)
Platelets: 270 10*3/uL (ref 150–400)
RBC: 4.72 MIL/uL (ref 4.22–5.81)
RDW: 17.2 % — ABNORMAL HIGH (ref 11.5–15.5)
WBC: 10.6 10*3/uL — ABNORMAL HIGH (ref 4.0–10.5)
nRBC: 0 % (ref 0.0–0.2)

## 2020-09-04 MED ORDER — AMIODARONE HCL 100 MG PO TABS
100.0000 mg | ORAL_TABLET | Freq: Every day | ORAL | Status: DC
  Administered 2020-09-04 – 2020-09-05 (×2): 100 mg via ORAL
  Filled 2020-09-04 (×2): qty 1

## 2020-09-04 NOTE — Progress Notes (Signed)
Progress Note  Patient Name: Leonard [name] Date of Encounter: 09/04/2020  [facility] Cardiologist: Glenetta Hew, MD   Subjective   Denies CP or dyspnea  Inpatient Medications    Scheduled Meds:  atorvastatin  80 mg Oral Daily   cholecalciferol  1,000 Units Oral Daily   clotrimazole   Topical BID   ezetimibe  10 mg Oral Daily   folic acid  1 mg Oral Daily   heparin  5,000 Units Subcutaneous Q8H   levothyroxine  125 mcg Oral Daily   midodrine  5 mg Oral TID WC   multivitamin with minerals  1 tablet Oral Daily   tamsulosin  0.4 mg Oral QPC breakfast   thiamine  100 mg Oral Daily   ticagrelor  60 mg Oral BID   Continuous Infusions:  PRN Meds: acetaminophen   Vital Signs    Vitals:   09/03/20 0509 09/03/20 1116 09/03/20 2001 09/04/20 0553  BP: '95/63 95/65 96/67 ' 102/63  Pulse: 70 73 71 70  Resp: '18 19 20 20  ' Temp: (!) 97.1 F (36.2 C) 97.6 F (36.4 C) 98.2 F (36.8 C) (!) 97.4 F (36.3 C)  TempSrc: Oral Oral Oral Oral  SpO2: 97% 97% 96% 100%  Weight:    109.2 kg  Height:        Intake/Output Summary (Last 24 hours) at 09/04/2020 0811 Last data filed at 09/04/2020 0500 Gross per 24 hour  Intake 236 ml  Output 200 ml  Net 36 ml    Last 3 Weights 09/04/2020 09/03/2020 09/02/2020  Weight (lbs) 240 lb 11.9 oz 234 lb 5.6 oz 232 lb 2.3 oz  Weight (kg) 109.2 kg 106.3 kg 105.3 kg      Telemetry    Sinus with ventricular pacing and PVCs- Personally Reviewed  Physical Exam   GEN: NAD, WD chronically ill appearing Neck: supple Cardiac: RRR Respiratory: CTA GI: Soft, NT/ND MS: Trace to 1+ ankle edema Neuro:  Grossly intact Psych: Normal affect   Labs    High Sensitivity Troponin:   Recent Labs  Lab 08/31/20 1310 08/31/20 1430  TROPONINIHS 75* 65*       Chemistry Recent Labs  Lab 08/31/20 1310 08/31/20 1354 09/02/20 0351 09/03/20 0414 09/04/20 0302  NA 130*   < > 134* 130* 131*  K 5.0   < > 4.3 4.2 4.2  CL 98   < > 101 98 101   CO2 18*   < > 22 20* 22  GLUCOSE 57*   < > 77 115* 112*  BUN 65*   < > 55* 54* 55*  CREATININE 3.85*   < > 2.84* 2.83* 2.75*  CALCIUM 9.2   < > 8.7* 8.9 8.9  PROT 6.2*  --  5.2* 5.5*  --   ALBUMIN 3.7  --  2.8* 3.0*  --   AST 42*  --  44* 34  --   ALT 29  --  26 26  --   ALKPHOS 90  --  75 78  --   BILITOT 2.2*  --  1.7* 1.5*  --   GFRNONAA 15*   < > 22* 22* 23*  ANIONGAP 14   < > '11 12 8   ' < > = values in this interval not displayed.      Hematology Recent Labs  Lab 09/02/20 0351 09/03/20 0414 09/04/20 0302  WBC 12.8* 11.3* 10.6*  RBC 4.67 4.69 4.72  HGB 12.5* 12.6* 12.8*  HCT 39.9 40.0 40.1  MCV 85.4 85.3 85.0  MCH 26.8 26.9 27.1  MCHC 31.3 31.5 31.9  RDW 17.2* 17.2* 17.2*  PLT 297 279 270     BNP Recent Labs  Lab 08/31/20 1310  BNP 4,467.9*      Patient Profile     75 y.o. male with past medical history of coronary artery disease, mixed ischemic/nonischemic cardiomyopathy, prior ICD, chronic combined systolic/diastolic congestive heart failure, hypertension, hyperlipidemia, chronic stage [name] kidney disease who is admitted after falling and noted to have hypotension.  Cardiology was asked to evaluate for hypotension and acute on chronic combined systolic/diastolic congestive heart failure. Cardiac catheterization May 2018 showed nonobstructive coronary disease with patent stent in the right coronary artery.  Echocardiogram this admission showed ejection fraction less than 20%, moderate left ventricular enlargement, restrictive filling, moderate RV dysfunction, moderate pulmonary hypertension, severe biatrial enlargement, moderate mitral regurgitation, moderate tricuspid regurgitation, mild aortic insufficiency.  Assessment & Plan    Acute on chronic systolic congestive heart failure-patient presented with mechanical falls x2 and was noted to be hypotensive with acute renal insufficiency.  Blood pressure has improved but remains borderline.  Renal function slowly  trending down but not at baseline.  His volume status is unchanged on examination.  We will continue to hold diuretics at this point.  Will resume lower dose once his renal function is closer to baseline. Elevated digoxin level-initial digoxin level was 2.9 likely due to acute on chronic renal insufficiency as well as amiodarone interaction.  Digoxin discontinued.  Would not resume. History of mixed ischemic/nonischemic cardiomyopathy-echocardiogram shows severe LV dysfunction.  Cardiac medications on hold due to hypotension at time of admission and acute renal failure.  Blood pressure remains borderline.  I will continue to hold all cardiac medications in the setting of hypotension and worsening renal function.  Will resume lower dose when able. Acute on chronic stage [name] kidney disease-creatinine improved since admission but not at baseline.  Continue to hold diuretics and cardiac medications.  Repeat be met in a.m. History of ventricular tachycardia-we will resume amiodarone 100 mg daily. Coronary artery disease-continue brilinta and statin. Minimally elevated troponin-no clear trend, no chest pain and occurring in the setting of acute renal insufficiency.  No plans for further ischemia evaluation. S/P ICD  Patient previously requested discharge and outpatient follow-up was arranged.  However per nursing he is unable to ambulate without assistance and not clear to me that he can be managed at home.  May need home health versus rehab.  Needs further discussion with the hospitalist service.  For questions or updates, please contact [place] Please consult [email or web address] for contact info under        Signed, Kirk Ruths, MD  09/04/2020, 8:11 AM

## 2020-09-04 NOTE — Progress Notes (Signed)
Heart Failure Stewardship Pharmacist Progress Note   PCP: Girtha Rm, NP-C PCP-Cardiologist: Glenetta Hew, MD    HPI:  75 yo M with PMH of CAD, mixed ICM/NICM, ICD (implanted 08/2015), HTN, CHF, HLD, and CKD III. He presented to the ED on 8/13 following a fall due to hypotension. CXR on admission with mild left basilar atelectasis and small left pleural effusion. An ECHO was done on 8/14 and LVEF was <20% (was 15-20% in Nov 2020 and Jan 2019; 20-25% in May 2018) with G3DD and moderately reduced RV systolic function. Cardiac cath in 2018 showed nonobstructive CAD with patent stent in RCA.   Current HF Medications: None (on midodrine 2.5 mg TID)  Prior to admission HF Medications: Torsemide 20 mg daily Entresto 24/26 mg BID Spironolactone 12.5 mg daily Jardiance 10 mg daily Digoxin 0.125 mg daily  Pertinent Lab Values: Serum creatinine 2.75, (baseline ~1.8-2), BUN 55, Potassium 4.2, Sodium 131, BNP 4467.9, Magnesium 2.5  Digoxin 2.9 (8/13)  Vital Signs: Weight: 240 lbs (admission weight: 232 lbs) Blood pressure: 90-100/60s  Heart rate: 60s   Medication Assistance / Insurance Benefits Check: Does the patient have prescription insurance?  Yes Type of insurance plan: Medicare + [place]:  Prior to admission outpatient pharmacy: [facility] Is the patient willing to use [facility] at discharge? Yes Is the patient willing to transition their outpatient pharmacy to utilize a [facility] outpatient pharmacy?   Pending    Assessment: 1. Acute on chronic systolic CHF (EF <03%), due to mixed ICM/NICM. NYHA class II symptoms. SCr continues to improve. Last known baseline ~1.8.  - Continue midodrine 2.5 mg TID - Holding all CHF medications with hypotension - Consider resuming Jardiance 10 mg daily once renal function allows - Digoxin on hold. Initial level 2.9 (collected 8/13 @ 1310 - last dose per patient was taken 8/12). No digifab given  asymptomatic.    Plan: 1) Medication changes recommended at this time: - Continue current regimen; BP and renal dysfunction limiting GDMT  2) Patient assistance: - Last seen in CHF clinic by Dr. Haroldine Laws in June 2018 - Appt scheduled for post-hospital follow up with AHF NP on 8/23   Kerby Nora, PharmD, BCPS Heart Failure Stewardship Pharmacist Phone [phone]

## 2020-09-04 NOTE — Progress Notes (Signed)
Pt blood pressure noted to be 81/61, covering provider paged, awaiting response at this time

## 2020-09-04 NOTE — Progress Notes (Signed)
PROGRESS NOTE    Leonard [name]  SWN:[id number] DOB: [birth year] DOA: 08/31/2020 PCP: Girtha Rm, NP-C    Brief Narrative:  Leonard [name] is a 75 y.o. male with medical history significant of ischemic cardiomyopathy, chronic combined systolic and diastolic heart failure, status post STEMI, status post AICD, dyslipidemia, CAD, V. fib, chronic LBBB, CAD status post RCA PCI as well as PTCA on Brilinta presents after sustaining 2 falls.  EMS was called and patient found to be hypotensive and bradycardic.  Also with urinary retention with Foley placed with large urine return.   BP improved with IV hydration. Has significant AKI with Cr 3.85 on admission. Cardiology is consulted and following.   Assessment & Plan:   Principal Problem:   Hypotension Active Problems:   Essential hypertension   Hyperlipidemia LDL goal <70   Acute on chronic combined systolic and diastolic CHF (congestive heart failure) (HCC)   Coronary artery disease involving native coronary artery without angina pectoris   Generalized weakness   CKD (chronic kidney disease) stage 3, GFR 30-59 ml/min (HCC)   Hypothyroid   Bradycardia   Intertrigo   Multiple falls  Acute on chronic combined systolic and diastolic congestive heart failure Severe ischemic cardiomyopathy status post ICD History of ventricular tachycardia presented with hypotension and acute kidney injury related to multiple medications and hypotension causing ATN:  Patient on Entresto, Jardiance, Aldactone, Demadex and digoxin at home. Clinically stabilizing after treatment with IV fluids and discontinuing all heart failure regimen including digoxin. Blood pressure is still remain low or low normal, midodrine dose increased to 5 mg 3 times a day today. Demadex on hold until improvement of renal functions. Followed by cardiology.  Digoxin toxicity with bradycardia: Digoxin level 2.9, likely worsened with worsening renal functions.   Discontinued. Amiodarone resumed today.  Acute urinary retention due to BPH: Successful voiding trial.  Flomax started.  Monitor for output and urinary retention.  Acute kidney injury on chronic kidney disease stage IIIb: Presentation creatinine 3.85.  Known baseline of 2.  Treated with IV fluid boluses. Renal ultrasound negative for obstruction. Trending down.  Creatinine is 2.75 today.  Hypothyroidism: Continue Synthroid.  Hyperlipidemia: On Lipitor.  Physical deconditioning and debility: Work with PT OT.  Refer to SNF for rehab.  DVT prophylaxis: heparin injection 5,000 Units Start: 08/31/20 1800 SCDs Start: 08/31/20 1557   Code Status: Full code Family Communication: Wife on the phone Disposition Plan: Status is: Inpatient  Remains inpatient appropriate because:Unsafe d/c plan  Dispo: The patient is from: Home              Anticipated d/c is to: SNF              Patient currently is medically stable to d/c.   Difficult to place patient No         Consultants:  Cardiology  Procedures:  None  Antimicrobials:  None   Subjective: Patient seen and examined.  Poor historian.  Denies any complaints.  Reportedly episodic low blood pressures up to 80 systolic, however he was without any orthostatic symptoms.  He tells me that if I suggest him to go to a skilled nursing rehab he will go.  Objective: Vitals:   09/03/20 2001 09/04/20 0553 09/04/20 1200 09/04/20 1228  BP: 96/67 102/63 (!) 81/61 91/65  Pulse: 71 70 62 61  Resp: 20 20 19    Temp: 98.2 F (36.8 C) (!) 97.4 F (36.3 C) 98 F (36.7 C)   TempSrc: Oral  Oral Oral   SpO2: 96% 100% 92%   Weight:  109.2 kg    Height:        Intake/Output Summary (Last 24 hours) at 09/04/2020 1452 Last data filed at 09/04/2020 1259 Gross per 24 hour  Intake 237 ml  Output 200 ml  Net 37 ml   Filed Weights   09/02/20 0451 09/03/20 0300 09/04/20 0553  Weight: 105.3 kg 106.3 kg 109.2 kg    Examination:  General  exam: Appears calm and comfortable  Chronically sick looking.  Not in distress.  Looks quite comfortable. Respiratory system: Clear to auscultation. Respiratory effort normal.  No added sounds. Cardiovascular system: S1 & S2 heard, regular rate rhythm.  1+ bilateral pedal edema.  AICD is in place. Gastrointestinal system: Abdomen is nondistended, soft and nontender. No organomegaly or masses felt. Normal bowel sounds heard. Central nervous system: Alert and oriented. No focal neurological deficits.  Generalized weakness.     Data Reviewed: I have personally reviewed following labs and imaging studies  CBC: Recent Labs  Lab 08/31/20 1310 08/31/20 1354 09/01/20 1529 09/02/20 0351 09/03/20 0414 09/04/20 0302  WBC 15.8*  --  15.8* 12.8* 11.3* 10.6*  NEUTROABS 13.6*  --   --   --   --   --   HGB 14.3 15.6 13.4 12.5* 12.6* 12.8*  HCT 46.1 46.0 41.8 39.9 40.0 40.1  MCV 86.5  --  85.3 85.4 85.3 85.0  PLT 309  --  283 297 279 829   Basic Metabolic Panel: Recent Labs  Lab 08/31/20 1310 08/31/20 1354 09/01/20 1529 09/02/20 0351 09/03/20 0414 09/04/20 0302  NA 130* 131* 134* 134* 130* 131*  K 5.0 4.9 4.9 4.3 4.2 4.2  CL 98 101 101 101 98 101  CO2 18*  --  21* 22 20* 22  GLUCOSE 57* 51* 66* 77 115* 112*  BUN 65* 60* 59* 55* 54* 55*  CREATININE 3.85* 3.70* 3.10* 2.84* 2.83* 2.75*  CALCIUM 9.2  --  9.0 8.7* 8.9 8.9  MG 2.4  --   --  2.5*  --   --    GFR: Estimated Creatinine Clearance: 29.1 mL/min (A) (by C-G formula based on SCr of 2.75 mg/dL (H)). Liver Function Tests: Recent Labs  Lab 08/31/20 1310 09/02/20 0351 09/03/20 0414  AST 42* 44* 34  ALT 29 26 26   ALKPHOS 90 75 78  BILITOT 2.2* 1.7* 1.5*  PROT 6.2* 5.2* 5.5*  ALBUMIN 3.7 2.8* 3.0*   No results for input(s): LIPASE, AMYLASE in the last 168 hours. No results for input(s): AMMONIA in the last 168 hours. Coagulation Profile: No results for input(s): INR, PROTIME in the last 168 hours. Cardiac Enzymes: No  results for input(s): CKTOTAL, CKMB, CKMBINDEX, TROPONINI in the last 168 hours. BNP (last 3 results) No results for input(s): PROBNP in the last 8760 hours. HbA1C: No results for input(s): HGBA1C in the last 72 hours. CBG: Recent Labs  Lab 09/01/20 2124  GLUCAP 134*   Lipid Profile: No results for input(s): CHOL, HDL, LDLCALC, TRIG, CHOLHDL, LDLDIRECT in the last 72 hours. Thyroid Function Tests: No results for input(s): TSH, T4TOTAL, FREET4, T3FREE, THYROIDAB in the last 72 hours. Anemia Panel: No results for input(s): VITAMINB12, FOLATE, FERRITIN, TIBC, IRON, RETICCTPCT in the last 72 hours. Sepsis Labs: Recent Labs  Lab 09/01/20 1529  PROCALCITON 0.12    Recent Results (from the past 240 hour(s))  SARS CORONAVIRUS 2 (TAT 6-24 HRS) Nasopharyngeal Nasopharyngeal Swab     Status:  None   Collection Time: 08/31/20  7:22 PM   Specimen: Nasopharyngeal Swab  Result Value Ref Range Status   SARS Coronavirus 2 NEGATIVE NEGATIVE Final    Comment: (NOTE) SARS-CoV-2 target nucleic acids are NOT DETECTED.  The SARS-CoV-2 RNA is generally detectable in upper and lower respiratory specimens during the acute phase of infection. Negative results do not preclude SARS-CoV-2 infection, do not rule out co-infections with other pathogens, and should not be used as the sole basis for treatment or other patient management decisions. Negative results must be combined with clinical observations, patient history, and epidemiological information. The expected result is Negative.  Fact Sheet for Patients: SugarRoll.be  Fact Sheet for Healthcare Providers: https://www.woods-mathews.com/  This test is not yet approved or cleared by the [place] FDA and  has been authorized for detection and/or diagnosis of SARS-CoV-2 by FDA under an Emergency Use Authorization (EUA). This EUA will remain  in effect (meaning this test can be used) for the duration of  the COVID-19 declaration under Se ction 564(b)(1) of the Act, 21 U.S.C. section 360bbb-3(b)(1), unless the authorization is terminated or revoked sooner.  Performed at [facility], [place] [address], [place], [place] [address]          Radiology Studies: No results found.      Scheduled Meds:  amiodarone  100 mg Oral Daily   atorvastatin  80 mg Oral Daily   cholecalciferol  1,000 Units Oral Daily   clotrimazole   Topical BID   ezetimibe  10 mg Oral Daily   folic acid  1 mg Oral Daily   heparin  5,000 Units Subcutaneous Q8H   levothyroxine  125 mcg Oral Daily   midodrine  5 mg Oral TID WC   multivitamin with minerals  1 tablet Oral Daily   tamsulosin  0.4 mg Oral QPC breakfast   thiamine  100 mg Oral Daily   ticagrelor  60 mg Oral BID   Continuous Infusions:   LOS: 4 days    Time spent: 32 minutes    Barb Merino, MD [facility] Pager [phone]

## 2020-09-04 NOTE — Care Management Important Message (Signed)
Important Message  Patient Details  Name: Leonard [name] MRN: [id number] Date of Birth: April 08, 1945   Medicare Important Message Given:  Yes     Shelda Altes 09/04/2020, 10:54 AM

## 2020-09-04 NOTE — Progress Notes (Signed)
Pt noted to be hypotensive, BP: 81/61, HR 62 RR 19 with O2 saturations noted to be at 92% on room air. Upon assessment pt noted to be A&O x4, denies dizziness, and lightheadedness, reports just feeling "tired". On call provider MD Barb Merino paged and notified, no new interventions at this time per provider, will continue to monitor for further changes.

## 2020-09-04 NOTE — NC FL2 (Signed)
[place] LEVEL OF CARE SCREENING TOOL     IDENTIFICATION  Patient Name: Leonard [name] Birthdate: Oct 26, 1945 Sex: male Admission Date (Current Location): 08/31/2020  [facility] and [place] Number:  Herbalist and Address:  [facility] [place], [place] [address] [place], [place] [address]      Provider Number: [id number]  Attending Physician Name and Address:  Barb Merino, MD  Relative Name and Phone Number:  Shanon Rosser (Spouse)   [phone]    Current Level of Care: Hospital Recommended Level of Care: [place] Prior Approval Number:    Date Approved/Denied:   PASRR Number:    Discharge Plan: SNF    Current Diagnoses: Patient Active Problem List   Diagnosis Date Noted   Bradycardia 09/01/2020   Intertrigo 09/01/2020   Multiple falls 09/01/2020   Hypothyroid 08/28/2020   Nausea in adult 08/28/2020   On amiodarone therapy 09/26/2019   Hypotension 05/21/2019   HFrEF (heart failure with reduced ejection fraction) ([place]) 05/21/2019   CKD (chronic kidney disease) stage 3, GFR 30-59 ml/min ([place]) 05/21/2019   VT (ventricular tachycardia) ([place]) 04/11/2019   Sinus tachycardia 04/11/2019   Debility 12/23/2018   LBBB (left bundle branch block)    Palliative care by specialist    DNR (do not resuscitate) discussion    Pressure injury of skin 12/18/2018   Generalized weakness 12/17/2018   Excessive drinking of alcohol 12/17/2018   Lower extremity cellulitis 12/17/2018   Depression 06/14/2018   Need for shingles vaccine 06/14/2018   S/P implantation of automatic cardioverter/defibrillator (AICD)    Grief 02/27/2016   Dilated cardiomyopathy ([place])    Obese    Acute on chronic combined systolic and diastolic CHF (congestive heart failure) ([place])    Sustained ventricular fibrillation ([place]) 08/20/2015   Neutrophilia 08/14/2015   Elevated glucose 08/14/2015   ST elevation myocardial infarction (STEMI) of true  posterior wall, subsequent episode of care ([place]) 08/12/2015   Coronary artery disease involving native coronary artery without angina pectoris 08/12/2015   Presence of drug coated stent in right coronary artery 08/12/2015   Essential hypertension 02/12/2015   Hyperlipidemia LDL goal <70 02/12/2015   Erectile dysfunction 02/12/2015   BPH (benign prostatic hyperplasia) 02/12/2015    Orientation RESPIRATION BLADDER Height & Weight     Self, Time, Situation, Place  Normal Incontinent, External catheter Weight: 240 lb 11.9 oz (109.2 kg) Height:  6\' 1"  (185.4 cm)  BEHAVIORAL SYMPTOMS/MOOD NEUROLOGICAL BOWEL NUTRITION STATUS        Diet (See DC Summary)  AMBULATORY STATUS COMMUNICATION OF NEEDS Skin   Limited Assist Verbally Normal                       Personal Care Assistance Level of Assistance  Bathing, Feeding, Dressing Bathing Assistance: Limited assistance Feeding assistance: Independent Dressing Assistance: Limited assistance     Functional Limitations Info  Sight, Hearing, Speech Sight Info: Adequate Hearing Info: Adequate      SPECIAL CARE FACTORS FREQUENCY  PT (By licensed PT), OT (By licensed OT)     PT Frequency: 5x a week OT Frequency: 5x a week            Contractures Contractures Info: Not present    Additional Factors Info  Code Status, Allergies Code Status Info: Full Allergies Info: Clams (Shellfish Allergy)   Shellfish Allergy   Shellfish-derived Products   Sulfa Antibiotics   Other   Sulfa Antibiotics  Current Medications (09/04/2020):  This is the current hospital active medication list Current Facility-Administered Medications  Medication Dose Route Frequency Provider Last Rate Last Admin   acetaminophen (TYLENOL) tablet 650 mg  650 mg Oral Q4H PRN [name], MD       amiodarone (PACERONE) tablet 100 mg  100 mg Oral Daily Lelon Perla, MD   100 mg at 09/04/20 1051   atorvastatin (LIPITOR) tablet 80 mg  80 mg Oral Daily  [name], MD   80 mg at 09/03/20 1830   cholecalciferol (VITAMIN D3) tablet 1,000 Units  1,000 Units Oral Daily [name], MD   1,000 Units at 09/04/20 1051   clotrimazole (LOTRIMIN) 1 % cream   Topical BID Nicole Kindred A, DO   Given at 09/04/20 1051   ezetimibe (ZETIA) tablet 10 mg  10 mg Oral Daily [name], MD   10 mg at 63/14/97 0263   folic acid (FOLVITE) tablet 1 mg  1 mg Oral Daily [name], MD   1 mg at 09/04/20 1051   heparin injection 5,000 Units  5,000 Units Subcutaneous Q8H [name], MD   5,000 Units at 09/04/20 1535   levothyroxine (SYNTHROID) tablet 125 mcg  125 mcg Oral Daily [name], MD   125 mcg at 09/04/20 0549   midodrine (PROAMATINE) tablet 5 mg  5 mg Oral TID WC Nicole Kindred A, DO   5 mg at 09/04/20 1055   multivitamin with minerals tablet 1 tablet  1 tablet Oral Daily [name], MD   1 tablet at 09/04/20 1051   tamsulosin (FLOMAX) capsule 0.4 mg  0.4 mg Oral QPC breakfast Nicole Kindred A, DO   0.4 mg at 09/04/20 7858   thiamine tablet 100 mg  100 mg Oral Daily [name], MD   100 mg at 09/04/20 1051   ticagrelor (BRILINTA) tablet 60 mg  60 mg Oral BID [name], MD   60 mg at 09/04/20 1051     Discharge Medications: Please see discharge summary for a list of discharge medications.  Relevant Imaging Results:  Relevant Lab Results:   Additional Information SSN: [id number]; [place] COVID-19 Vaccine 10/30/2019 , 04/13/2019 , 03/15/2019  Reece Agar, LCSWA

## 2020-09-04 NOTE — Evaluation (Signed)
Physical Therapy Treatment Patient Details Name: Leonard [name] MRN: [id number] DOB: 06/21/1945 Today's Date: 09/04/2020   History of Present Illness  Pt is a 75 y.o. male admitted 08/31/20 after falls, denies hitting his head. Suspect falls related to symptomatic bradycardia and hypotension. PMH includes ischemic cardiomyopathy, CHF, AICD, CAD, STEMI, afib, CKD.   Clinical Impression  Pt progressing with mobility. Pt able to improve ambulation distance, requires modA to stand from low surface heights and close min guard during gait secondary to instability. Pt remains limited by generalized weakness, decreased activity tolerance, impaired balance strategies/postural reactions. Continue to recommend SNF-level therapies to maximize functional mobility and independence prior to return home.  Orthostatic BPs Supine 95/69  Sitting 97/68  Standing 102/46  Post-ambulation 92/61   HR 74 SpO2 94% on RA     Follow Up Recommendations SNF;Supervision for mobility/OOB    Equipment Recommendations  None recommended by PT    Recommendations for Other Services       Precautions / Restrictions Precautions Precautions: Fall;Other (comment) Precaution Comments: Hypotension (pt asymptomatic) Restrictions Weight Bearing Restrictions: No      Mobility  Bed Mobility Overal bed mobility: Modified Independent             General bed mobility comments: Cues to perform as independently as possible; HOB elevated, use of bed rail    Transfers Overall transfer level: Needs assistance Equipment used: Rolling walker (2 wheeled) Transfers: Sit to/from Stand Sit to Stand: Mod assist         General transfer comment: ModA for trunk elevation standing to RW from low bed height; repeated verbal cues for sequencing/technique and hand placement; minA for eccentric control to sit  Ambulation/Gait Ambulation/Gait assistance: Min guard Gait Distance (Feet): 60 Feet Assistive device: Rolling  walker (2 wheeled) Gait Pattern/deviations: Step-through pattern;Decreased stride length;Decreased weight shift to left;Trunk flexed;Decreased step length - left Gait velocity: Decreased   General Gait Details: Slow, mildly unsteady gait with RW and min guard for balance; verbal cues for sequencing and RW mangement; further distance limited by fatigue  Stairs            Wheelchair Mobility    Modified Rankin (Stroke Patients Only)       Balance Overall balance assessment: Needs assistance Sitting-balance support: Feet supported Sitting balance-Leahy Scale: Fair Sitting balance - Comments: requires assist to don bilateral socks   Standing balance support: Bilateral upper extremity supported;During functional activity Standing balance-Leahy Scale: Poor Standing balance comment: Reliant on UE support                             Pertinent Vitals/Pain Pain Assessment: Faces Faces Pain Scale: Hurts a little bit Pain Location: L foot Pain Descriptors / Indicators: Discomfort Pain Intervention(s): Monitored during session    Home Living Family/patient expects to be discharged to:: Private residence Living Arrangements: Spouse/significant other Available Help at Discharge: Family;Available PRN/intermittently Type of Home: House Home Access: Stairs to enter Entrance Stairs-Rails: Right Entrance Stairs-Number of Steps: curb + 2 Home Layout: One level Home Equipment: Shower seat;Grab bars - toilet;Grab bars - tub/shower;Hand held shower head;Walker - 2 wheels Additional Comments: Wife works outside the home, but typically home at lunch time    Prior Function Level of Independence: Needs assistance   Gait / Transfers Assistance Needed: Mod indep ambulating with RW. Sometimes requires assist to stand from low surface or toilet  ADL's / Homemaking Assistance Needed: Pt reports typically mod  indep with ADLs, wife assists reaching back during shower, wife sometimes  assists with pericare. Wife performs household task  Comments: Retired Chief Strategy Officer        Extremity/Trunk Assessment   Upper Extremity Assessment Upper Extremity Assessment: Generalized weakness    Lower Extremity Assessment Lower Extremity Assessment: Generalized weakness       Communication   Communication: No difficulties  Cognition Arousal/Alertness: Awake/alert Behavior During Therapy: WFL for tasks assessed/performed Overall Cognitive Status: No family/caregiver present to determine baseline cognitive functioning Area of Impairment: Attention;Safety/judgement;Following commands;Awareness;Problem solving                   Current Attention Level: Selective   Following Commands: Follows one step commands consistently Safety/Judgement: Decreased awareness of safety;Decreased awareness of deficits Awareness: Emergent Problem Solving: Requires verbal cues General Comments: Question decreased awareness/insight into current functional mobility and ability to safely d/c home; when discussing d/c plans, pt states, "If you say I need rehab, I agree..." then states, "My wife plans to have me home with home health services..." pt alternating back/forth between SNF or HH despite multiple attempts to educate and 'get on same page' regarding d/c plan      General Comments General comments (skin integrity, edema, etc.): Increased time discussing d/c recommendations - pt alternating between [facility] and need for SNF; per case manager, wife with preference for SNF as she works and is unable to provide adequate physical assist    Exercises     Assessment/Plan    PT Assessment Patient needs continued PT services  PT Problem List Decreased strength;Decreased activity tolerance;Decreased balance;Decreased mobility;Decreased safety awareness;Decreased cognition;Cardiopulmonary status limiting activity       PT Treatment Interventions DME  instruction;Gait training;Stair training;Functional mobility training;Therapeutic activities;Therapeutic exercise;Balance training;Patient/family education;Cognitive remediation    PT Goals (Current goals can be found in the Care Plan section)  Acute Rehab PT Goals Patient Stated Goal: "I want to go home" PT Goal Formulation: With patient Time For Goal Achievement: 09/16/20 Potential to Achieve Goals: Good    Frequency Min 3X/week   Barriers to discharge Decreased caregiver support;Inaccessible home environment      Co-evaluation               AM-PAC PT "6 Clicks" Mobility  Outcome Measure Help needed turning from your back to your side while in a flat bed without using bedrails?: A Little Help needed moving from lying on your back to sitting on the side of a flat bed without using bedrails?: A Little Help needed moving to and from a bed to a chair (including a wheelchair)?: A Lot Help needed standing up from a chair using your arms (e.g., wheelchair or bedside chair)?: A Lot Help needed to walk in hospital room?: A Little Help needed climbing 3-5 steps with a railing? : A Lot 6 Click Score: 15    End of Session Equipment Utilized During Treatment: Gait belt Activity Tolerance: Patient tolerated treatment well;Patient limited by fatigue Patient left: in chair;with call bell/phone within reach;with chair alarm set   PT Visit Diagnosis: Unsteadiness on feet (R26.81);Muscle weakness (generalized) (M62.81);Difficulty in walking, not elsewhere classified (R26.2)    Time: 2671-2458 PT Time Calculation (min) (ACUTE ONLY): 27 min   Charges:     PT Treatments $Gait Training: 8-22 mins $Therapeutic Activity: 8-22 mins      Mabeline Caras, PT, DPT Acute Rehabilitation Services  Pager [phone] Office [place] 09/04/2020, 10:32  AM

## 2020-09-04 NOTE — TOC Initial Note (Signed)
Transition of Care [facility]) - Initial/Assessment Note    Patient Details  Name: Leonard [name] MRN: [id number] Date of Birth: July 18, 1945  Transition of Care [facility]) CM/SW Contact:    Tresa Endo Phone Number: 09/04/2020, 3:42 PM  Clinical Narrative:                 CSW received SNF consult. CSW met with pt at bedside. CSW introduced self and explained role at the hospital. Pt reports that PTA the lived at home with his wife and enough to stay home alone,was ambulating well. PT reports pt walked 60 feet and used a 2 wheeled rolling walker. Pt needs min to modA overall.  CSW reviewed PT/OT recommendations for SNF. Pt reports he does not want to go to a SNF but his wife cannot care for him while he is at this state medically. Pt wife works and will fell secure with pt being in a SNF. Pt and wife gave CSW permission to fax out to facilities in the area. Pt has no preference of facility at this time. CSW gave pt medicare.gov rating list to review. PT reports they are covid negative and had had vaccinations plus a booster.  CSW will continue to follow.    Expected Discharge Plan: [place] Barriers to Discharge: Continued Medical Work up   Patient Goals and CMS Choice        Expected Discharge Plan and Services Expected Discharge Plan: [place]   Discharge Planning Services: CM Consult   Living arrangements for the past 2 months: Single Family Home                                      Prior Living Arrangements/Services Living arrangements for the past 2 months: Single Family Home Lives with:: Spouse Patient language and need for interpreter reviewed:: Yes        Need for Family Participation in Patient Care: Yes (Comment) Care giver support system in place?: Yes (comment)   Criminal Activity/Legal Involvement Pertinent to Current Situation/Hospitalization: No - Comment as needed  Activities of Daily Living       Permission Sought/Granted                  Emotional Assessment       Orientation: : Oriented to Self, Oriented to Place, Oriented to Situation Alcohol / Substance Use: Not Applicable Psych Involvement: No (comment)  Admission diagnosis:  AKI (acute kidney injury) ([place]) [N17.9] Hypotension [I95.9] Patient Active Problem List   Diagnosis Date Noted   Bradycardia 09/01/2020   Intertrigo 09/01/2020   Multiple falls 09/01/2020   Hypothyroid 08/28/2020   Nausea in adult 08/28/2020   On amiodarone therapy 09/26/2019   Hypotension 05/21/2019   HFrEF (heart failure with reduced ejection fraction) ([place]) 05/21/2019   CKD (chronic kidney disease) stage 3, GFR 30-59 ml/min ([place]) 05/21/2019   VT (ventricular tachycardia) ([place]) 04/11/2019   Sinus tachycardia 04/11/2019   Debility 12/23/2018   LBBB (left bundle branch block)    Palliative care by specialist    DNR (do not resuscitate) discussion    Pressure injury of skin 12/18/2018   Generalized weakness 12/17/2018   Excessive drinking of alcohol 12/17/2018   Lower extremity cellulitis 12/17/2018   Depression 06/14/2018   Need for shingles vaccine 06/14/2018   S/P implantation of automatic cardioverter/defibrillator (AICD)    Grief  02/27/2016   Dilated cardiomyopathy ([place])    Obese    Acute on chronic combined systolic and diastolic CHF (congestive heart failure) (HCC)    Sustained ventricular fibrillation ([place]) 08/20/2015   Neutrophilia 08/14/2015   Elevated glucose 08/14/2015   ST elevation myocardial infarction (STEMI) of true posterior wall, subsequent episode of care ([place]) 08/12/2015   Coronary artery disease involving native coronary artery without angina pectoris 08/12/2015   Presence of drug coated stent in right coronary artery 08/12/2015   Essential hypertension 02/12/2015   Hyperlipidemia LDL goal <70 02/12/2015   Erectile dysfunction 02/12/2015   BPH (benign prostatic hyperplasia) 02/12/2015   PCP:  Girtha Rm, NP-C Pharmacy:   [facility] [place], [place] - [address] AT [address] [address] Phone: [phone] Fax: [phone]  [facility] [place], [place] AT [address] [address] Phone: [phone] Fax: [phone]  [facility] [address] [place] [address] Phone: [phone] Fax: [phone]     Social Determinants of Health (SDOH) Interventions    Readmission Risk Interventions No flowsheet data found.

## 2020-09-04 NOTE — Plan of Care (Signed)
  Problem: Education: Goal: Knowledge of General Education information will improve Description Including pain rating scale, medication(s)/side effects and non-pharmacologic comfort measures Outcome: Progressing   Problem: Health Behavior/Discharge Planning: Goal: Ability to manage health-related needs will improve Outcome: Progressing   

## 2020-09-05 DIAGNOSIS — Z515 Encounter for palliative care: Secondary | ICD-10-CM | POA: Diagnosis not present

## 2020-09-05 DIAGNOSIS — E039 Hypothyroidism, unspecified: Secondary | ICD-10-CM | POA: Diagnosis not present

## 2020-09-05 DIAGNOSIS — I5023 Acute on chronic systolic (congestive) heart failure: Secondary | ICD-10-CM | POA: Diagnosis not present

## 2020-09-05 DIAGNOSIS — M47812 Spondylosis without myelopathy or radiculopathy, cervical region: Secondary | ICD-10-CM | POA: Diagnosis not present

## 2020-09-05 DIAGNOSIS — I9589 Other hypotension: Secondary | ICD-10-CM | POA: Diagnosis not present

## 2020-09-05 DIAGNOSIS — E872 Acidosis: Secondary | ICD-10-CM | POA: Diagnosis present

## 2020-09-05 DIAGNOSIS — I255 Ischemic cardiomyopathy: Secondary | ICD-10-CM | POA: Diagnosis not present

## 2020-09-05 DIAGNOSIS — R570 Cardiogenic shock: Secondary | ICD-10-CM | POA: Diagnosis present

## 2020-09-05 DIAGNOSIS — I4901 Ventricular fibrillation: Secondary | ICD-10-CM | POA: Diagnosis not present

## 2020-09-05 DIAGNOSIS — D729 Disorder of white blood cells, unspecified: Secondary | ICD-10-CM | POA: Diagnosis not present

## 2020-09-05 DIAGNOSIS — L89890 Pressure ulcer of other site, unstageable: Secondary | ICD-10-CM | POA: Diagnosis not present

## 2020-09-05 DIAGNOSIS — R063 Periodic breathing: Secondary | ICD-10-CM | POA: Diagnosis not present

## 2020-09-05 DIAGNOSIS — R7309 Other abnormal glucose: Secondary | ICD-10-CM | POA: Diagnosis not present

## 2020-09-05 DIAGNOSIS — S0003XA Contusion of scalp, initial encounter: Secondary | ICD-10-CM | POA: Diagnosis present

## 2020-09-05 DIAGNOSIS — W19XXXA Unspecified fall, initial encounter: Secondary | ICD-10-CM | POA: Diagnosis not present

## 2020-09-05 DIAGNOSIS — I2510 Atherosclerotic heart disease of native coronary artery without angina pectoris: Secondary | ICD-10-CM | POA: Diagnosis not present

## 2020-09-05 DIAGNOSIS — W06XXXA Fall from bed, initial encounter: Secondary | ICD-10-CM | POA: Diagnosis present

## 2020-09-05 DIAGNOSIS — J811 Chronic pulmonary edema: Secondary | ICD-10-CM | POA: Diagnosis not present

## 2020-09-05 DIAGNOSIS — E861 Hypovolemia: Secondary | ICD-10-CM | POA: Diagnosis present

## 2020-09-05 DIAGNOSIS — R5383 Other fatigue: Secondary | ICD-10-CM | POA: Diagnosis not present

## 2020-09-05 DIAGNOSIS — E46 Unspecified protein-calorie malnutrition: Secondary | ICD-10-CM | POA: Diagnosis not present

## 2020-09-05 DIAGNOSIS — D171 Benign lipomatous neoplasm of skin and subcutaneous tissue of trunk: Secondary | ICD-10-CM | POA: Diagnosis not present

## 2020-09-05 DIAGNOSIS — Z043 Encounter for examination and observation following other accident: Secondary | ICD-10-CM | POA: Diagnosis not present

## 2020-09-05 DIAGNOSIS — F32A Depression, unspecified: Secondary | ICD-10-CM | POA: Diagnosis not present

## 2020-09-05 DIAGNOSIS — I509 Heart failure, unspecified: Secondary | ICD-10-CM | POA: Diagnosis not present

## 2020-09-05 DIAGNOSIS — L89312 Pressure ulcer of right buttock, stage 2: Secondary | ICD-10-CM | POA: Diagnosis present

## 2020-09-05 DIAGNOSIS — I517 Cardiomegaly: Secondary | ICD-10-CM | POA: Diagnosis not present

## 2020-09-05 DIAGNOSIS — Z66 Do not resuscitate: Secondary | ICD-10-CM | POA: Diagnosis present

## 2020-09-05 DIAGNOSIS — L304 Erythema intertrigo: Secondary | ICD-10-CM | POA: Diagnosis not present

## 2020-09-05 DIAGNOSIS — F419 Anxiety disorder, unspecified: Secondary | ICD-10-CM | POA: Diagnosis not present

## 2020-09-05 DIAGNOSIS — I728 Aneurysm of other specified arteries: Secondary | ICD-10-CM | POA: Diagnosis not present

## 2020-09-05 DIAGNOSIS — I5084 End stage heart failure: Secondary | ICD-10-CM | POA: Diagnosis present

## 2020-09-05 DIAGNOSIS — R296 Repeated falls: Secondary | ICD-10-CM | POA: Diagnosis present

## 2020-09-05 DIAGNOSIS — I110 Hypertensive heart disease with heart failure: Secondary | ICD-10-CM | POA: Diagnosis not present

## 2020-09-05 DIAGNOSIS — E871 Hypo-osmolality and hyponatremia: Secondary | ICD-10-CM | POA: Diagnosis present

## 2020-09-05 DIAGNOSIS — R7303 Prediabetes: Secondary | ICD-10-CM | POA: Diagnosis not present

## 2020-09-05 DIAGNOSIS — N179 Acute kidney failure, unspecified: Secondary | ICD-10-CM | POA: Diagnosis present

## 2020-09-05 DIAGNOSIS — S299XXA Unspecified injury of thorax, initial encounter: Secondary | ICD-10-CM | POA: Diagnosis not present

## 2020-09-05 DIAGNOSIS — J90 Pleural effusion, not elsewhere classified: Secondary | ICD-10-CM | POA: Diagnosis not present

## 2020-09-05 DIAGNOSIS — I5043 Acute on chronic combined systolic (congestive) and diastolic (congestive) heart failure: Secondary | ICD-10-CM | POA: Diagnosis present

## 2020-09-05 DIAGNOSIS — R0902 Hypoxemia: Secondary | ICD-10-CM | POA: Diagnosis not present

## 2020-09-05 DIAGNOSIS — Z20822 Contact with and (suspected) exposure to covid-19: Secondary | ICD-10-CM | POA: Diagnosis present

## 2020-09-05 DIAGNOSIS — I872 Venous insufficiency (chronic) (peripheral): Secondary | ICD-10-CM | POA: Diagnosis not present

## 2020-09-05 DIAGNOSIS — R627 Adult failure to thrive: Secondary | ICD-10-CM | POA: Diagnosis present

## 2020-09-05 DIAGNOSIS — R001 Bradycardia, unspecified: Secondary | ICD-10-CM | POA: Diagnosis not present

## 2020-09-05 DIAGNOSIS — R0989 Other specified symptoms and signs involving the circulatory and respiratory systems: Secondary | ICD-10-CM | POA: Diagnosis not present

## 2020-09-05 DIAGNOSIS — N401 Enlarged prostate with lower urinary tract symptoms: Secondary | ICD-10-CM | POA: Diagnosis not present

## 2020-09-05 DIAGNOSIS — I447 Left bundle-branch block, unspecified: Secondary | ICD-10-CM | POA: Diagnosis not present

## 2020-09-05 DIAGNOSIS — Z789 Other specified health status: Secondary | ICD-10-CM | POA: Diagnosis not present

## 2020-09-05 DIAGNOSIS — S61411A Laceration without foreign body of right hand, initial encounter: Secondary | ICD-10-CM | POA: Diagnosis present

## 2020-09-05 DIAGNOSIS — I130 Hypertensive heart and chronic kidney disease with heart failure and stage 1 through stage 4 chronic kidney disease, or unspecified chronic kidney disease: Secondary | ICD-10-CM | POA: Diagnosis present

## 2020-09-05 DIAGNOSIS — M5136 Other intervertebral disc degeneration, lumbar region: Secondary | ICD-10-CM | POA: Diagnosis not present

## 2020-09-05 DIAGNOSIS — I420 Dilated cardiomyopathy: Secondary | ICD-10-CM | POA: Diagnosis not present

## 2020-09-05 DIAGNOSIS — L24A2 Irritant contact dermatitis due to fecal, urinary or dual incontinence: Secondary | ICD-10-CM | POA: Diagnosis not present

## 2020-09-05 DIAGNOSIS — Z7189 Other specified counseling: Secondary | ICD-10-CM | POA: Diagnosis not present

## 2020-09-05 DIAGNOSIS — S0083XA Contusion of other part of head, initial encounter: Secondary | ICD-10-CM | POA: Diagnosis present

## 2020-09-05 DIAGNOSIS — I5082 Biventricular heart failure: Secondary | ICD-10-CM | POA: Diagnosis present

## 2020-09-05 DIAGNOSIS — E785 Hyperlipidemia, unspecified: Secondary | ICD-10-CM | POA: Diagnosis not present

## 2020-09-05 DIAGNOSIS — Z7401 Bed confinement status: Secondary | ICD-10-CM | POA: Diagnosis not present

## 2020-09-05 DIAGNOSIS — I1310 Hypertensive heart and chronic kidney disease without heart failure, with stage 1 through stage 4 chronic kidney disease, or unspecified chronic kidney disease: Secondary | ICD-10-CM | POA: Diagnosis not present

## 2020-09-05 DIAGNOSIS — Y92122 Bedroom in nursing home as the place of occurrence of the external cause: Secondary | ICD-10-CM | POA: Diagnosis not present

## 2020-09-05 DIAGNOSIS — I252 Old myocardial infarction: Secondary | ICD-10-CM | POA: Diagnosis not present

## 2020-09-05 DIAGNOSIS — N184 Chronic kidney disease, stage 4 (severe): Secondary | ICD-10-CM | POA: Diagnosis present

## 2020-09-05 DIAGNOSIS — S60511A Abrasion of right hand, initial encounter: Secondary | ICD-10-CM | POA: Diagnosis not present

## 2020-09-05 DIAGNOSIS — J9811 Atelectasis: Secondary | ICD-10-CM | POA: Diagnosis not present

## 2020-09-05 DIAGNOSIS — I5020 Unspecified systolic (congestive) heart failure: Secondary | ICD-10-CM | POA: Diagnosis not present

## 2020-09-05 DIAGNOSIS — E875 Hyperkalemia: Secondary | ICD-10-CM | POA: Diagnosis present

## 2020-09-05 DIAGNOSIS — J301 Allergic rhinitis due to pollen: Secondary | ICD-10-CM | POA: Diagnosis present

## 2020-09-05 DIAGNOSIS — I701 Atherosclerosis of renal artery: Secondary | ICD-10-CM | POA: Diagnosis not present

## 2020-09-05 DIAGNOSIS — Z4502 Encounter for adjustment and management of automatic implantable cardiac defibrillator: Secondary | ICD-10-CM | POA: Diagnosis not present

## 2020-09-05 DIAGNOSIS — R338 Other retention of urine: Secondary | ICD-10-CM | POA: Diagnosis not present

## 2020-09-05 DIAGNOSIS — N1832 Chronic kidney disease, stage 3b: Secondary | ICD-10-CM | POA: Diagnosis not present

## 2020-09-05 DIAGNOSIS — R5381 Other malaise: Secondary | ICD-10-CM | POA: Diagnosis not present

## 2020-09-05 DIAGNOSIS — I10 Essential (primary) hypertension: Secondary | ICD-10-CM | POA: Diagnosis not present

## 2020-09-05 DIAGNOSIS — I472 Ventricular tachycardia: Secondary | ICD-10-CM | POA: Diagnosis not present

## 2020-09-05 DIAGNOSIS — M6281 Muscle weakness (generalized): Secondary | ICD-10-CM | POA: Diagnosis not present

## 2020-09-05 DIAGNOSIS — S301XXA Contusion of abdominal wall, initial encounter: Secondary | ICD-10-CM | POA: Diagnosis present

## 2020-09-05 DIAGNOSIS — N189 Chronic kidney disease, unspecified: Secondary | ICD-10-CM | POA: Diagnosis not present

## 2020-09-05 DIAGNOSIS — R2689 Other abnormalities of gait and mobility: Secondary | ICD-10-CM | POA: Diagnosis not present

## 2020-09-05 DIAGNOSIS — R339 Retention of urine, unspecified: Secondary | ICD-10-CM | POA: Diagnosis present

## 2020-09-05 DIAGNOSIS — I959 Hypotension, unspecified: Secondary | ICD-10-CM | POA: Diagnosis not present

## 2020-09-05 LAB — BASIC METABOLIC PANEL
Anion gap: 7 (ref 5–15)
BUN: 51 mg/dL — ABNORMAL HIGH (ref 8–23)
CO2: 25 mmol/L (ref 22–32)
Calcium: 8.7 mg/dL — ABNORMAL LOW (ref 8.9–10.3)
Chloride: 100 mmol/L (ref 98–111)
Creatinine, Ser: 2.84 mg/dL — ABNORMAL HIGH (ref 0.61–1.24)
GFR, Estimated: 22 mL/min — ABNORMAL LOW (ref >60)
Glucose, Bld: 109 mg/dL — ABNORMAL HIGH (ref 70–99)
Potassium: 4.7 mmol/L (ref 3.5–5.1)
Sodium: 132 mmol/L — ABNORMAL LOW (ref 135–145)

## 2020-09-05 LAB — RESP PANEL BY RT-PCR (FLU A&B, COVID) ARPGX2
Influenza A by PCR: NEGATIVE
Influenza B by PCR: NEGATIVE
SARS Coronavirus 2 by RT PCR: NEGATIVE

## 2020-09-05 MED ORDER — TAMSULOSIN HCL 0.4 MG PO CAPS: 0.4000 mg | ORAL_CAPSULE | Freq: Every day | ORAL | Status: AC

## 2020-09-05 MED ORDER — TORSEMIDE 20 MG PO TABS: 20.0000 mg | ORAL_TABLET | Freq: Every day | ORAL | Status: DC

## 2020-09-05 MED ORDER — COVID-19 MRNA VAC-TRIS(PFIZER) 30 MCG/0.3ML IM SUSP
0.3000 mL | Freq: Once | INTRAMUSCULAR | Status: AC
  Administered 2020-09-05: 0.3 mL via INTRAMUSCULAR
  Filled 2020-09-05: qty 0.3

## 2020-09-05 MED ORDER — CLOTRIMAZOLE 1 % EX CREA: TOPICAL_CREAM | Freq: Two times a day (BID) | CUTANEOUS | 0 refills | Status: AC

## 2020-09-05 MED ORDER — MIDODRINE HCL 5 MG PO TABS: 5.0000 mg | ORAL_TABLET | Freq: Three times a day (TID) | ORAL | Status: AC

## 2020-09-05 NOTE — Progress Notes (Signed)
Occupational Therapy Treatment Patient Details Name: Leonard [name] MRN: [id number] DOB: Dec 09, 1945 Today's Date: 09/05/2020    History of present illness Pt is a 75 y.o. male admitted 08/31/20 after falls, denies hitting his head. Suspect falls related to symptomatic bradycardia and hypotension. PMH includes ischemic cardiomyopathy, CHF, AICD, CAD, STEMI, afib, CKD.   OT comments  Pt progressing towards acute OT goals. Focus of session was working on functional transfers and mobility. Mod A on initial stand from recliner seat height. Improved to min guard assist to stand from elevated seat height. Improved performance with repetition. Cues for technique with rw. Completed bathroom distance mobility 3x with min guard assist and rw utilized. Pt eager to regain independence and PLOF. D/c plan remains appropriate.     Follow Up Recommendations  Home health OT;SNF;Supervision/Assistance - 24 hour    Equipment Recommendations  None recommended by OT    Recommendations for Other Services      Precautions / Restrictions Precautions Precautions: Fall;Other (comment) Precaution Comments: Hypotension (pt asymptomatic) Restrictions Weight Bearing Restrictions: No       Mobility Bed Mobility               General bed mobility comments: up in recliner    Transfers Overall transfer level: Needs assistance Equipment used: Rolling walker (2 wheeled) Transfers: Sit to/from Stand Sit to Stand: Min guard;Min assist;Mod assist         General transfer comment: Mod A on initial stand from recliner seat height. Improved to min guard assist with standing with elevating seat height. Improved with repetition. Cues fro technique with rw    Balance Overall balance assessment: Needs assistance Sitting-balance support: Feet supported Sitting balance-Leahy Scale: Fair Sitting balance - Comments: requires assist to don bilateral socks Postural control: Posterior lean Standing balance  support: Bilateral upper extremity supported;During functional activity Standing balance-Leahy Scale: Poor Standing balance comment: Reliant on UE support                           ADL either performed or assessed with clinical judgement   ADL Overall ADL's : Needs assistance/impaired                         Toilet Transfer: Moderate assistance;Ambulation;BSC;RW Toilet Transfer Details (indicate cue type and reason): heavy Mod A inititally improving to moderate  min A. Improved functional performance with higher seat surface         Functional mobility during ADLs: Min guard;Rolling walker General ADL Comments: Pt practiced 5 sit<>stands from recliner height and elevated EOB height. Pt walked bathroom distance x3. Min guard to stand from elevated seat height. Mod A to stand from regular seat height.     Vision       Perception     Praxis      Cognition Arousal/Alertness: Awake/alert Behavior During Therapy: WFL for tasks assessed/performed Overall Cognitive Status: No family/caregiver present to determine baseline cognitive functioning Area of Impairment: Attention;Safety/judgement;Following commands;Awareness;Problem solving                   Current Attention Level: Selective   Following Commands: Follows one step commands consistently Safety/Judgement: Decreased awareness of safety;Decreased awareness of deficits Awareness: Emergent Problem Solving: Requires verbal cues General Comments: Verbal perseveration noted (vs STM deficits?).        Exercises     Shoulder Instructions       General Comments  Pertinent Vitals/ Pain       Pain Assessment: Faces Faces Pain Scale: Hurts a little bit Pain Location: L foot Pain Descriptors / Indicators: Discomfort Pain Intervention(s): Monitored during session  Home Living                                          Prior Functioning/Environment               Frequency  Min 2X/week        Progress Toward Goals  OT Goals(current goals can now be found in the care plan section)  Progress towards OT goals: Progressing toward goals  Acute Rehab OT Goals Patient Stated Goal: "I want to go home" OT Goal Formulation: With patient Time For Goal Achievement: 09/16/20 Potential to Achieve Goals: Good ADL Goals Pt Will Perform Grooming: with modified independence;standing Pt Will Perform Upper Body Bathing: with modified independence;sitting Pt Will Perform Lower Body Bathing: with modified independence;sit to/from stand Pt Will Perform Upper Body Dressing: with modified independence;sitting Pt Will Perform Lower Body Dressing: with modified independence;sit to/from stand Pt Will Transfer to Toilet: with modified independence;stand pivot transfer Pt Will Perform Toileting - Clothing Manipulation and hygiene: with modified independence;sit to/from stand  Plan Discharge plan remains appropriate    Co-evaluation                 AM-PAC OT "6 Clicks" Daily Activity     Outcome Measure   Help from another person eating meals?: None Help from another person taking care of personal grooming?: A Little Help from another person toileting, which includes using toliet, bedpan, or urinal?: A Lot Help from another person bathing (including washing, rinsing, drying)?: A Lot Help from another person to put on and taking off regular upper body clothing?: A Little Help from another person to put on and taking off regular lower body clothing?: A Lot 6 Click Score: 16    End of Session Equipment Utilized During Treatment: Rolling walker;Gait belt  OT Visit Diagnosis: Unsteadiness on feet (R26.81);Repeated falls (R29.6)   Activity Tolerance Patient tolerated treatment well   Patient Left in chair;with call bell/phone within reach;with chair alarm set   Nurse Communication          Time: [phone] OT Time Calculation (min): 36  min  Charges: OT General Charges $OT Visit: 1 Visit OT Treatments $Self Care/Home Management : 23-37 mins  Tyrone Schimke, OT Acute Rehabilitation Services Pager: [phone] Office: [phone]    [name] 09/05/2020, 1:44 PM

## 2020-09-05 NOTE — TOC Progression Note (Addendum)
Transition of Care [facility]) - Progression Note    Patient Details  Name: Leonard [name] MRN: [id number] Date of Birth: 05-26-1945  Transition of Care [facility]) CM/SW Contact  Reece Agar, [place] Phone Number: 09/05/2020, 10:30 AM  Clinical Narrative:    Pt wife contacted CSW for bed offers, CSW provided bed offers. Pt wife will contact CSW after reviewing the facilities to provide facility choice.  Pt wife chose [facility], this facility id familiar with this pt bc he has been a short term resident in the past. Pt is full medicare and does not need auth. RN got pt covid swab which has return negative and Juliann Pulse at [facility] is aware and has been updated.  Juliann Pulse from [facility] asked if pt could get covid booster before pt arrives to the facility. CSW spoke with pt wife who thinks the booster would be okay, CSW spoke with pt as well who would like to get the booster and is ready for DC.  Expected Discharge Plan: [place] Barriers to Discharge: Continued Medical Work up  Expected Discharge Plan and Services Expected Discharge Plan: [place]   Discharge Planning Services: CM Consult   Living arrangements for the past 2 months: Single Family Home                                       Social Determinants of Health (SDOH) Interventions    Readmission Risk Interventions No flowsheet data found.

## 2020-09-05 NOTE — TOC Transition Note (Signed)
Transition of Care [facility]) - CM/SW Discharge Note   Patient Details  Name: Leonard [name] MRN: [id number] Date of Birth: Feb 28, 1945  Transition of Care [facility]) CM/SW Contact:  Tresa Endo Phone Number: 09/05/2020, 2:09 PM   Clinical Narrative:    Patient will DC to: [place] date: 09/05/2020 Family notified: Pt spouse Transport by: Corey Harold   Per MD patient ready for DC to [facility]. RN to call report prior to discharge [phone]). RN, patient, patient's family, and facility notified of DC. Discharge Summary and FL2 sent to facility. DC packet on chart. Ambulance transport requested for patient.   CSW will sign off for now as social work intervention is no longer needed. Please consult [place] again if new needs arise.       Barriers to Discharge: Continued Medical Work up   Patient Goals and CMS Choice        Discharge Placement                       Discharge Plan and Services   Discharge Planning Services: CM Consult                                 Social Determinants of Health (SDOH) Interventions     Readmission Risk Interventions No flowsheet data found.

## 2020-09-05 NOTE — Progress Notes (Signed)
Heart Failure Stewardship Pharmacist Progress Note   PCP: Girtha Rm, NP-C PCP-Cardiologist: Glenetta Hew, MD    HPI:  75 yo M with PMH of CAD, mixed ICM/NICM, ICD (implanted 08/2015), HTN, CHF, HLD, and CKD III. He presented to the ED on 8/13 following a fall due to hypotension. CXR on admission with mild left basilar atelectasis and small left pleural effusion. An ECHO was done on 8/14 and LVEF was <20% (was 15-20% in Nov 2020 and Jan 2019; 20-25% in May 2018) with G3DD and moderately reduced RV systolic function. Cardiac cath in 2018 showed nonobstructive CAD with patent stent in RCA.   Current HF Medications: None (on midodrine 5 mg TID)  Prior to admission HF Medications: Torsemide 20 mg daily Entresto 24/26 mg BID Spironolactone 12.5 mg daily Jardiance 10 mg daily Digoxin 0.125 mg daily  Pertinent Lab Values: Serum creatinine 2.84, (baseline ~1.8-2), BUN 51, Potassium 4.7, Sodium 132, BNP 4467.9  Digoxin 2.9 (8/13)  Vital Signs: Weight: 234 lbs (admission weight: 232 lbs) Blood pressure: 90/60s  Heart rate: 60s   Medication Assistance / Insurance Benefits Check: Does the patient have prescription insurance?  Yes Type of insurance plan: Medicare + [place]:  Prior to admission outpatient pharmacy: [facility] Is the patient willing to use [facility] at discharge? Yes Is the patient willing to transition their outpatient pharmacy to utilize a [facility] outpatient pharmacy?   Pending    Assessment: 1. Acute on chronic systolic CHF (EF <85%), due to mixed ICM/NICM. NYHA class II symptoms.   - Continue midodrine 5 mg TID - Holding all CHF medications with hypotension - Consider resuming Jardiance 10 mg daily once renal function allows - Digoxin on hold. Initial level 2.9 (collected 8/13 @ 1310 - last dose per patient was taken 8/12). No digifab given asymptomatic.    Plan: 1) Medication changes recommended at this time: -  Continue current regimen; BP and renal dysfunction limiting GDMT  2) Patient assistance: - Last seen in CHF clinic by Dr. Haroldine Laws in June 2018 - Appt scheduled for post-hospital follow up with AHF NP on 8/23   Kerby Nora, PharmD, BCPS Heart Failure Stewardship Pharmacist Phone [phone]

## 2020-09-05 NOTE — Progress Notes (Signed)
Progress Note  Patient Name: Leonard [name] Date of Encounter: 09/05/2020  [facility] Cardiologist: Glenetta Hew, MD   Subjective   No CP or dyspnea; wants to go home  Inpatient Medications    Scheduled Meds:  amiodarone  100 mg Oral Daily   atorvastatin  80 mg Oral Daily   cholecalciferol  1,000 Units Oral Daily   clotrimazole   Topical BID   ezetimibe  10 mg Oral Daily   folic acid  1 mg Oral Daily   heparin  5,000 Units Subcutaneous Q8H   levothyroxine  125 mcg Oral Daily   midodrine  5 mg Oral TID WC   multivitamin with minerals  1 tablet Oral Daily   tamsulosin  0.4 mg Oral QPC breakfast   thiamine  100 mg Oral Daily   ticagrelor  60 mg Oral BID   Continuous Infusions:  PRN Meds: acetaminophen   Vital Signs    Vitals:   09/04/20 2056 09/05/20 0157 09/05/20 0158 09/05/20 0944  BP: 92/63  91/71 94/66  Pulse: 66  68 64  Resp: 18  18 (!) 22  Temp: 97.7 F (36.5 C)  97.9 F (36.6 C)   TempSrc: Oral  Oral   SpO2: 92%  98% 100%  Weight:  106.5 kg    Height:        Intake/Output Summary (Last 24 hours) at 09/05/2020 1158 Last data filed at 09/05/2020 1100 Gross per 24 hour  Intake 1183 ml  Output 200 ml  Net 983 ml    Last 3 Weights 09/05/2020 09/04/2020 09/03/2020  Weight (lbs) 234 lb 12.6 oz 240 lb 11.9 oz 234 lb 5.6 oz  Weight (kg) 106.5 kg 109.2 kg 106.3 kg      Telemetry    Sinus with ventricular pacing- Personally Reviewed  Physical Exam   GEN: WD obese Neck: no adenopathy Cardiac: RRR; PMI difficult to palpate Respiratory: CTA; no rhonchi GI: Soft, NT/ND, no masses; trace presacral edema MS: Trace ankle edema Neuro:  No focal findings Psych: Normal affect   Labs    High Sensitivity Troponin:   Recent Labs  Lab 08/31/20 1310 08/31/20 1430  TROPONINIHS 75* 65*       Chemistry Recent Labs  Lab 08/31/20 1310 08/31/20 1354 09/02/20 0351 09/03/20 0414 09/04/20 0302 09/05/20 0113  NA 130*   < > 134* 130* 131* 132*   K 5.0   < > 4.3 4.2 4.2 4.7  CL 98   < > 101 98 101 100  CO2 18*   < > 22 20* 22 25  GLUCOSE 57*   < > 77 115* 112* 109*  BUN 65*   < > 55* 54* 55* 51*  CREATININE 3.85*   < > 2.84* 2.83* 2.75* 2.84*  CALCIUM 9.2   < > 8.7* 8.9 8.9 8.7*  PROT 6.2*  --  5.2* 5.5*  --   --   ALBUMIN 3.7  --  2.8* 3.0*  --   --   AST 42*  --  44* 34  --   --   ALT 29  --  26 26  --   --   ALKPHOS 90  --  75 78  --   --   BILITOT 2.2*  --  1.7* 1.5*  --   --   GFRNONAA 15*   < > 22* 22* 23* 22*  ANIONGAP 14   < > 11 12 8 7    < > = values in  this interval not displayed.      Hematology Recent Labs  Lab 09/02/20 0351 09/03/20 0414 09/04/20 0302  WBC 12.8* 11.3* 10.6*  RBC 4.67 4.69 4.72  HGB 12.5* 12.6* 12.8*  HCT 39.9 40.0 40.1  MCV 85.4 85.3 85.0  MCH 26.8 26.9 27.1  MCHC 31.3 31.5 31.9  RDW 17.2* 17.2* 17.2*  PLT 297 279 270     BNP Recent Labs  Lab 08/31/20 1310  BNP 4,467.9*       Radiology    No results found.   Patient Profile     75 y.o. male with past medical history of coronary artery disease, mixed ischemic/nonischemic cardiomyopathy, prior ICD, chronic combined systolic/diastolic congestive heart failure, hypertension, hyperlipidemia, chronic stage [name] kidney disease who is admitted after falling and noted to have hypotension.  Cardiology was asked to evaluate for hypotension and acute on chronic combined systolic/diastolic congestive heart failure. Cardiac catheterization May 2018 showed nonobstructive coronary disease with patent stent in the right coronary artery.  Echocardiogram this admission showed ejection fraction less than 20%, moderate left ventricular enlargement, restrictive filling, moderate RV dysfunction, moderate pulmonary hypertension, severe biatrial enlargement, moderate mitral regurgitation, moderate tricuspid regurgitation, mild aortic insufficiency.  Assessment & Plan    Acute on chronic systolic congestive heart failure-patient presented with  mechanical falls x2 and was noted to be hypotensive with acute renal insufficiency.  Blood pressure has improved but remains borderline.  Renal function unchanged today. Patient wishes to be discharged.  He remains asymptomatic.  Would continue to hold diuretics today and tomorrow and resume Demadex 20 mg daily Saturday.  Continue off of spironolactone Elevated digoxin level-initial digoxin level was 2.9 likely due to acute on chronic renal insufficiency as well as amiodarone interaction.  Digoxin discontinued.  Would not resume. History of mixed ischemic/nonischemic cardiomyopathy-echocardiogram shows severe LV dysfunction.  Cardiac medications on hold due to hypotension at time of admission and acute renal failure.  Systolic blood pressure remains 95 and pt on midodrine.  Follow-up in CHF clinic has been arranged.  They will recheck potassium and renal function at that time. Acute on chronic stage [name] kidney disease-creatinine unchanged today.  Would resume Demadex on Saturday as outlined above.  He is scheduled to have follow-up in CHF clinic next week.  Check potassium and renal function at that time. History of ventricular tachycardia-continue amiodarone 100 mg daily. Coronary artery disease-continue brilinta and statin. Minimally elevated troponin-no clear trend, no chest pain and occurring in the setting of acute renal insufficiency.  No plans for further ischemia evaluation. S/P ICD  Patient adamant that he would like to be discharged.  He will be difficult to care for at home and he is being evaluated for possible rehab.  Home health also a possibility.  As outlined above he is scheduled for CHF clinic next week.  Check potassium and renal function at that time.  Can resume CHF medications as blood pressure allows.  For questions or updates, please contact [place] Please consult [email or web address] for contact info under        Signed, Kirk Ruths, MD  09/05/2020, 11:58 AM

## 2020-09-05 NOTE — Discharge Summary (Addendum)
Physician Discharge Summary  Leonard [name] YBO:[id number] DOB: [birth year] DOA: 08/31/2020  PCP: Girtha Rm, NP-C  Admit date: 08/31/2020 Discharge date: 09/05/2020  Admitted From: Home Disposition: Skilled nursing facility  Recommendations for Outpatient Follow-up:  Follow up with PCP in 1-2 weeks Please obtain BMP/CBC in one week Patient has a scheduled follow-up with cardiology office next week.  Home Health: Not applicable Equipment/Devices: Not applicable  Discharge Condition: Fair CODE STATUS: Full code Diet recommendation: Low-salt diet  Discharge summary: Leonard [name] is a 75 y.o. male with medical history significant of ischemic cardiomyopathy, chronic combined systolic and diastolic heart failure, status post STEMI, status post AICD, dyslipidemia, CAD, V. fib, chronic LBBB, CAD status post RCA PCI as well as PTCA on Brilinta presents after sustaining 2 falls.  EMS was called and patient found to be hypotensive and bradycardic.  Also with urinary retention with Foley placed with large urine return.   BP improved with IV hydration. Has significant AKI with Cr 3.85 on admission. Cardiology is consulted and followed in the hospital     Assessment & Plan:   Acute on chronic combined systolic and diastolic congestive heart failure Severe ischemic cardiomyopathy status post ICD History of ventricular tachycardia presented with hypotension and acute kidney injury related to multiple medications and hypotension causing ATN:  Patient on Entresto, Jardiance, Aldactone, Demadex and digoxin at home. Clinically stabilizing after treatment with IV fluids and discontinuing all heart failure regimen including digoxin. Blood pressure still remain low or low normal, midodrine dose increased to 5 mg 3 times a day.  Followed by cardiology. They will schedule follow-up in the office next week. Addendum: cardio recommended to start torsemide 20 mg daily on 8/20   Digoxin  toxicity with bradycardia: Digoxin level 2.9, likely worsened with worsening renal functions.  Discontinued. Amiodarone resumed, 100 mg daily.   Acute urinary retention due to BPH: Successful voiding trial.  Flomax started.  Monitor for output and urinary retention.  Patient has sulfa allergy, tolerated Flomax for last 5 days.  Acute kidney injury on chronic kidney disease stage IIIb: Presentation creatinine 3.85.  Known baseline of 2.  Treated with IV fluid boluses. Renal ultrasound negative for obstruction. Trending down.  Creatinine is 2.75-2.8  Recheck in 1 week.   Hypothyroidism: Continue Synthroid.  Hyperlipidemia: On Lipitor.   Physical deconditioning and debility: Work with PT OT.  Refer to SNF for rehab.  Chronically sick with extensive medical issues however medically stable today.  Able to transfer to skilled level of care.   Discharge Diagnoses:  Principal Problem:   Hypotension Active Problems:   Essential hypertension   Hyperlipidemia LDL goal <70   Acute on chronic combined systolic and diastolic CHF (congestive heart failure) (HCC)   Coronary artery disease involving native coronary artery without angina pectoris   Generalized weakness   CKD (chronic kidney disease) stage 3, GFR 30-59 ml/min (HCC)   Hypothyroid   Bradycardia   Intertrigo   Multiple falls    Discharge Instructions  Discharge Instructions     Call MD for:  difficulty breathing, headache or visual disturbances   Complete by: As directed    Diet - low sodium heart healthy   Complete by: As directed    Increase activity slowly   Complete by: As directed    No wound care   Complete by: As directed       Allergies as of 09/05/2020       Reactions   Clams [shellfish Allergy]  Shortness Of Breath, Itching, Swelling   Shellfish Allergy Anaphylaxis   Shellfish-derived Products Shortness Of Breath, Itching, Swelling   Sulfa Antibiotics Other (See Comments)   unknown   Other Itching, Other  (See Comments)   Ragweed causes watery eyes, itching, sneezing   Sulfa Antibiotics Other (See Comments)   Pt unsure of reaction, mother told him he was allergic         Medication List     STOP taking these medications    CoQ10 200 MG Caps   digoxin 0.125 MG tablet Commonly known as: LANOXIN   Entresto 24-26 MG Generic drug: sacubitril-valsartan   spironolactone 25 MG tablet Commonly known as: ALDACTONE       TAKE these medications    acetaminophen 325 MG tablet Commonly known as: TYLENOL Take 2 tablets (650 mg total) by mouth every 4 (four) hours as needed for headache or mild pain.   amiodarone 200 MG tablet Commonly known as: PACERONE Take 0.5 tablets (100 mg total) by mouth daily. What changed: when to take this   atorvastatin 80 MG tablet Commonly known as: LIPITOR Take 1 tablet (80 mg total) by mouth daily. TAKE 1 TABLET BY MOUTH DAILY AT 6:00 PM. What changed:  when to take this additional instructions   Brilinta 60 MG Tabs tablet Generic drug: ticagrelor TAKE 1 TABLET(60 MG) BY MOUTH TWICE DAILY What changed: See the new instructions.   clotrimazole 1 % cream Commonly known as: LOTRIMIN Apply topically 2 (two) times daily.   empagliflozin 10 MG Tabs tablet Commonly known as: Jardiance Take 1 tablet (10 mg total) by mouth daily. What changed: when to take this   ezetimibe 10 MG tablet Commonly known as: ZETIA Take 1 tablet (10 mg total) by mouth daily. What changed: when to take this   levothyroxine 125 MCG tablet Commonly known as: SYNTHROID TAKE 1 TABLET BY MOUTH EVERY DAY What changed: when to take this   midodrine 5 MG tablet Commonly known as: PROAMATINE Take 1 tablet (5 mg total) by mouth 3 (three) times daily with meals.   MULTIVITAMIN ADULT EXTRA C PO Take 1 tablet by mouth daily after breakfast.   NUTRITIONAL SUPPLEMENT PO Take 1 tablet by mouth daily.   ondansetron 4 MG tablet Commonly known as: Zofran Take 1 tablet (4  mg total) by mouth every 8 (eight) hours as needed for nausea or vomiting.   tamsulosin 0.4 MG Caps capsule Commonly known as: FLOMAX Take 1 capsule (0.4 mg total) by mouth daily after breakfast. Start taking on: September 06, 2020   torsemide 20 MG tablet Commonly known as: DEMADEX Take 1 tablet (20 mg total) by mouth daily. TAKE 1 TABLET(20 MG) BY MOUTH DAILY Start taking on: September 07, 2020 What changed:  See the new instructions. These instructions start on September 07, 2020. If you are unsure what to do until then, ask your doctor or other care provider.   venlafaxine 75 MG tablet Commonly known as: EFFEXOR TAKE 1 TABLET(75 MG) BY MOUTH DAILY What changed: See the new instructions.   Vitamin D 50 MCG (2000 UT) Caps Take 0.0005 capsules (1 Units total) by mouth daily. What changed:  how much to take when to take this        [facility] Follow up.   Specialty: Cardiology Why: September 10, 2020 at 9:00 am  [facility] [place]  Garage Code [id number] Contact information: [address] [id number] [place], [place] Follow up.   Specialty: Home Health Services Why: Prudencio Pair, Social Worker Contact information: [address] [place] [address] [phone]                Allergies  Allergen Reactions   Clams [Shellfish Allergy] Shortness Of Breath, Itching and Swelling   Shellfish Allergy Anaphylaxis   Shellfish-Derived Products Shortness Of Breath, Itching and Swelling   Sulfa Antibiotics Other (See Comments)    unknown   Other Itching and Other (See Comments)    Ragweed causes watery eyes, itching, sneezing   Sulfa Antibiotics Other (See Comments)    Pt unsure of reaction, mother told him he was allergic      Consultations: Cardiology   Procedures/Studies: US RENAL  Result Date: 08/31/2020 CLINICAL DATA:  Acute kidney injury. EXAM: RENAL / URINARY TRACT ULTRASOUND COMPLETE COMPARISON:  None. FINDINGS: Right Kidney: Renal measurements: 9.3 x 5.1 x 4.1 cm = volume: 100.1 mL. Echogenicity within normal limits. No mass or hydronephrosis visualized. Left Kidney: Renal measurements: 9.2 x 4.6 x 3.4 cm = volume: 74 mL. Echogenicity within normal limits. No mass or hydronephrosis visualized. Bladder: Bladder is decompressed by Foley catheter. Other: None. IMPRESSION: No acute findings.  No hydronephrosis. Electronically Signed   By: Franki Cabot M.D.   On: 08/31/2020 17:01   DG Chest Portable 1 View  Result Date: 08/31/2020 CLINICAL DATA:  Hypotension and fall. EXAM: PORTABLE CHEST 1 VIEW COMPARISON:  Chest radiograph dated 12/17/2018. FINDINGS: The heart is enlarged. Vascular calcifications are seen in the aortic arch. Mild left basilar atelectasis/airspace disease is noted. A small left pleural effusion may contribute. The right lung is clear without pleural effusion. There is no pneumothorax. A left subclavian approach cardiac device is redemonstrated. Degenerative changes are seen in the spine. IMPRESSION: Mild left basilar atelectasis/airspace disease. A small left pleural effusion may contribute. Aortic Atherosclerosis (ICD10-I70.0). Electronically Signed   By: Zerita Boers M.D.   On: 08/31/2020 13:58   ECHOCARDIOGRAM COMPLETE  Result Date: 09/01/2020    ECHOCARDIOGRAM REPORT   Patient Name:   DEZMON CONOVER [name] Date of Exam: 09/01/2020 Medical Rec #:  [id number]           Height:       73.0 in Accession #:    [id number]          Weight:       225.0 lb Date of Birth:  03/08/1945            BSA:          2.262 m Patient Age:    41 years            BP:           117/76 mmHg Patient Gender: M                   HR:           57 bpm. Exam Location:  Inpatient Procedure: 2D Echo Indications:    acute  systolic chf  History:        Patient has prior history of Echocardiogram examinations, most                 recent 12/18/2018. Cardiomyopathy, CAD, Defibrillator, chronic  kidney disease; Risk Factors:Hypertension and Dyslipidemia.  Sonographer:    Johny Chess RDCS Referring Phys: [id number] [place]  1. Global hypokinesis with akinesis of the inferior and inferolateral walls; overall sevrere LV dysfunction.  2. Left ventricular ejection fraction, by estimation, is <20%. The left ventricle has severely decreased function. The left ventricle demonstrates regional wall motion abnormalities (see scoring diagram/findings for description). The left ventricular internal cavity size was moderately dilated. There is mild left ventricular hypertrophy. Left ventricular diastolic parameters are consistent with Grade [name] diastolic dysfunction (restrictive).  3. Right ventricular systolic function is moderately reduced. The right ventricular size is normal. There is moderately elevated pulmonary artery systolic pressure.  4. Left atrial size was severely dilated.  5. Right atrial size was severely dilated.  6. Moderate pleural effusion in the left lateral region.  7. The mitral valve is normal in structure. Moderate mitral valve regurgitation. No evidence of mitral stenosis.  8. Tricuspid valve regurgitation is moderate.  9. The aortic valve is tricuspid. Aortic valve regurgitation is mild. Mild to moderate aortic valve sclerosis/calcification is present, without any evidence of aortic stenosis. 10. The inferior vena cava is dilated in size with >50% respiratory variability, suggesting right atrial pressure of 8 mmHg. FINDINGS  Left Ventricle: Left ventricular ejection fraction, by estimation, is <20%. The left ventricle has severely decreased function. The left ventricle demonstrates regional wall motion abnormalities. The left ventricular internal cavity size was moderately dilated. There is  mild left ventricular hypertrophy. Left ventricular diastolic parameters are consistent with Grade [name] diastolic dysfunction (restrictive). Right Ventricle: The right ventricular size is normal. Right ventricular systolic function is moderately reduced. There is moderately elevated pulmonary artery systolic pressure. The tricuspid regurgitant velocity is 3.16 m/s, and with an assumed right atrial pressure of 8 mmHg, the estimated right ventricular systolic pressure is 37.9 mmHg. Left Atrium: Left atrial size was severely dilated. Right Atrium: Right atrial size was severely dilated. Pericardium: There is no evidence of pericardial effusion. Mitral Valve: The mitral valve is normal in structure. Moderate mitral valve regurgitation. No evidence of mitral valve stenosis. Tricuspid Valve: The tricuspid valve is normal in structure. Tricuspid valve regurgitation is moderate . No evidence of tricuspid stenosis. Aortic Valve: The aortic valve is tricuspid. Aortic valve regurgitation is mild. Mild to moderate aortic valve sclerosis/calcification is present, without any evidence of aortic stenosis. Pulmonic Valve: The pulmonic valve was normal in structure. Pulmonic valve regurgitation is not visualized. No evidence of pulmonic stenosis. Aorta: The aortic root is normal in size and structure. Venous: The inferior vena cava is dilated in size with greater than 50% respiratory variability, suggesting right atrial pressure of 8 mmHg. IAS/Shunts: No atrial level shunt detected by color flow Doppler. Additional Comments: Global hypokinesis with akinesis of the inferior and inferolateral walls; overall sevrere LV dysfunction. A device lead is visualized. There is a moderate pleural effusion in the left lateral region.  LEFT VENTRICLE PLAX 2D LVIDd:         6.00 cm      Diastology LVIDs:         5.50 cm      LV e' medial: 3.92 cm/s LV PW:         1.20 cm LV IVS:        1.30 cm LVOT diam:     2.10 cm LV SV:         44 LV SV Index:    19 LVOT Area:     3.46 cm  LV Volumes (MOD) LV vol d, MOD A4C: 229.0 ml LV vol s, MOD A4C: 177.0 ml LV SV MOD A4C:     229.0 ml RIGHT VENTRICLE         IVC TAPSE (M-mode): 1.5 cm  IVC diam: 2.30 cm LEFT ATRIUM              Index       RIGHT ATRIUM           Index LA diam:        4.70 cm  2.08 cm/m  RA Area:     31.10 cm LA Vol (A2C):   162.0 ml 71.62 ml/m RA Volume:   120.00 ml 53.05 ml/m LA Vol (A4C):   138.0 ml 61.01 ml/m LA Biplane Vol: 156.0 ml 68.96 ml/m  AORTIC VALVE LVOT Vmax:   69.50 cm/s LVOT Vmean:  44.700 cm/s LVOT VTI:    0.126 m  AORTA Ao Root diam: 3.30 cm Ao Asc diam:  3.40 cm MR Peak grad:   77.4 mmHg   TRICUSPID VALVE MR Mean grad:   44.0 mmHg   TR Peak grad:   39.9 mmHg MR Vmax:        440.00 cm/s TR Vmax:        316.00 cm/s MR Vmean:       311.0 cm/s MR PISA:        1.01 cm    SHUNTS MR PISA Radius: 0.40 cm     Systemic VTI:  0.13 m                             Systemic Diam: 2.10 cm Kirk Ruths MD Electronically signed by Kirk Ruths MD Signature Date/Time: 09/01/2020/11:20:34 AM    Final    (Echo, Carotid, EGD, Colonoscopy, ERCP)    Subjective: Seen and examined.  Denies any complaints.  No other overnight events.  Looking forward to go to rehab.  He tells me that he can even go home, however he could not walk with physical therapist.   Discharge Exam: Vitals:   09/05/20 0944 09/05/20 1100  BP: 94/66 90/63  Pulse: 64 60  Resp: (!) 22 20  Temp:  98 F (36.7 C)  SpO2: 100% 93%   Vitals:   09/05/20 0157 09/05/20 0158 09/05/20 0944 09/05/20 1100  BP:  91/71 94/66 90/63   Pulse:  68 64 60  Resp:  18 (!) 22 20  Temp:  97.9 F (36.6 C)  98 F (36.7 C)  TempSrc:  Oral  Oral  SpO2:  98% 100% 93%  Weight: 106.5 kg     Height:        General: Pt is alert, awake, not in acute distress On room air.  Chronically sick looking.  Not in any distress. Cardiovascular: RRR, S1/S2 +, no rubs, no gallops Respiratory: CTA bilaterally, no wheezing, no rhonchi.  AICD in  place. Abdominal: Soft, NT, ND, bowel sounds + Extremities: Trace bilateral pedal edema.    The results of significant diagnostics from this hospitalization (including imaging, microbiology, ancillary and laboratory) are listed below for reference.     Microbiology: Recent Results (from the past 240 hour(s))  SARS CORONAVIRUS 2 (TAT 6-24 HRS) Nasopharyngeal Nasopharyngeal Swab     Status: None   Collection Time: 08/31/20  7:22 PM   Specimen: Nasopharyngeal Swab  Result Value Ref Range Status   SARS Coronavirus 2 NEGATIVE NEGATIVE Final    Comment: (NOTE)  SARS-CoV-2 target nucleic acids are NOT DETECTED.  The SARS-CoV-2 RNA is generally detectable in upper and lower respiratory specimens during the acute phase of infection. Negative results do not preclude SARS-CoV-2 infection, do not rule out co-infections with other pathogens, and should not be used as the sole basis for treatment or other patient management decisions. Negative results must be combined with clinical observations, patient history, and epidemiological information. The expected result is Negative.  Fact Sheet for Patients: SugarRoll.be  Fact Sheet for Healthcare Providers: https://www.woods-mathews.com/  This test is not yet approved or cleared by the [place] FDA and  has been authorized for detection and/or diagnosis of SARS-CoV-2 by FDA under an Emergency Use Authorization (EUA). This EUA will remain  in effect (meaning this test can be used) for the duration of the COVID-19 declaration under Se ction 564(b)(1) of the Act, 21 U.S.C. section 360bbb-3(b)(1), unless the authorization is terminated or revoked sooner.  Performed at [facility], [place] [address] [place], [place] [address]      Labs: BNP (last 3 results) Recent Labs    08/31/20 1310  BNP 5,053.9*   Basic Metabolic Panel: Recent Labs  Lab 08/31/20 1310 08/31/20 1354 09/01/20 1529  09/02/20 0351 09/03/20 0414 09/04/20 0302 09/05/20 0113  NA 130*   < > 134* 134* 130* 131* 132*  K 5.0   < > 4.9 4.3 4.2 4.2 4.7  CL 98   < > 101 101 98 101 100  CO2 18*  --  21* 22 20* 22 25  GLUCOSE 57*   < > 66* 77 115* 112* 109*  BUN 65*   < > 59* 55* 54* 55* 51*  CREATININE 3.85*   < > 3.10* 2.84* 2.83* 2.75* 2.84*  CALCIUM 9.2  --  9.0 8.7* 8.9 8.9 8.7*  MG 2.4  --   --  2.5*  --   --   --    < > = values in this interval not displayed.   Liver Function Tests: Recent Labs  Lab 08/31/20 1310 09/02/20 0351 09/03/20 0414  AST 42* 44* 34  ALT 29 26 26   ALKPHOS 90 75 78  BILITOT 2.2* 1.7* 1.5*  PROT 6.2* 5.2* 5.5*  ALBUMIN 3.7 2.8* 3.0*   No results for input(s): LIPASE, AMYLASE in the last 168 hours. No results for input(s): AMMONIA in the last 168 hours. CBC: Recent Labs  Lab 08/31/20 1310 08/31/20 1354 09/01/20 1529 09/02/20 0351 09/03/20 0414 09/04/20 0302  WBC 15.8*  --  15.8* 12.8* 11.3* 10.6*  NEUTROABS 13.6*  --   --   --   --   --   HGB 14.3 15.6 13.4 12.5* 12.6* 12.8*  HCT 46.1 46.0 41.8 39.9 40.0 40.1  MCV 86.5  --  85.3 85.4 85.3 85.0  PLT 309  --  283 297 279 270   Cardiac Enzymes: No results for input(s): CKTOTAL, CKMB, CKMBINDEX, TROPONINI in the last 168 hours. BNP: Invalid input(s): POCBNP CBG: Recent Labs  Lab 09/01/20 2124  GLUCAP 134*   D-Dimer No results for input(s): DDIMER in the last 72 hours. Hgb A1c No results for input(s): HGBA1C in the last 72 hours. Lipid Profile No results for input(s): CHOL, HDL, LDLCALC, TRIG, CHOLHDL, LDLDIRECT in the last 72 hours. Thyroid function studies No results for input(s): TSH, T4TOTAL, T3FREE, THYROIDAB in the last 72 hours.  Invalid input(s): FREET3 Anemia work up No results for input(s): VITAMINB12, FOLATE, FERRITIN, TIBC, IRON, RETICCTPCT in the last  72 hours. Urinalysis    Component Value Date/Time   COLORURINE YELLOW 05/22/2019 0050   APPEARANCEUR CLEAR 05/22/2019 0050   LABSPEC  1.005 05/22/2019 0050   PHURINE 6.0 05/22/2019 0050   GLUCOSEU NEGATIVE 05/22/2019 0050   HGBUR NEGATIVE 05/22/2019 0050   BILIRUBINUR NEGATIVE 05/22/2019 0050   BILIRUBINUR n 04/18/2015 1106   KETONESUR NEGATIVE 05/22/2019 0050   PROTEINUR NEGATIVE 05/22/2019 0050   UROBILINOGEN negative 04/18/2015 1106   NITRITE NEGATIVE 05/22/2019 0050   LEUKOCYTESUR NEGATIVE 05/22/2019 0050   Sepsis Labs Invalid input(s): PROCALCITONIN,  WBC,  LACTICIDVEN Microbiology Recent Results (from the past 240 hour(s))  SARS CORONAVIRUS 2 (TAT 6-24 HRS) Nasopharyngeal Nasopharyngeal Swab     Status: None   Collection Time: 08/31/20  7:22 PM   Specimen: Nasopharyngeal Swab  Result Value Ref Range Status   SARS Coronavirus 2 NEGATIVE NEGATIVE Final    Comment: (NOTE) SARS-CoV-2 target nucleic acids are NOT DETECTED.  The SARS-CoV-2 RNA is generally detectable in upper and lower respiratory specimens during the acute phase of infection. Negative results do not preclude SARS-CoV-2 infection, do not rule out co-infections with other pathogens, and should not be used as the sole basis for treatment or other patient management decisions. Negative results must be combined with clinical observations, patient history, and epidemiological information. The expected result is Negative.  Fact Sheet for Patients: SugarRoll.be  Fact Sheet for Healthcare Providers: https://www.woods-mathews.com/  This test is not yet approved or cleared by the [place] FDA and  has been authorized for detection and/or diagnosis of SARS-CoV-2 by FDA under an Emergency Use Authorization (EUA). This EUA will remain  in effect (meaning this test can be used) for the duration of the COVID-19 declaration under Se ction 564(b)(1) of the Act, 21 U.S.C. section 360bbb-3(b)(1), unless the authorization is terminated or revoked sooner.  Performed at [facility], [place] [address]  [place], [place] [address]      Time coordinating discharge:  32 minutes  SIGNED:   Barb Merino, MD  [facility] 09/05/2020, 1:00 PM

## 2020-09-06 ENCOUNTER — Other Ambulatory Visit: Payer: Self-pay

## 2020-09-06 NOTE — Patient Outreach (Signed)
[facility]) Care Management  09/06/2020  Barnard Sharps III 10-17-1945 [id number]  [place] [facility] [ACO] Patient: Medicare CMS DCE  Primary Care Provider:  Girtha Rm, NP-C of [place]   Will refer that the patient to be followed by [facility] RN with traditional Medicare for any known or needs for transitional care needs for returning to post facility care or complex disease management.  For questions or referrals, please contact:   Natividad Brood, RN BSN [facility] Liaison  [phone] business mobile phone Toll free office [phone]  Fax number: [phone] [email or web address]

## 2020-09-09 ENCOUNTER — Telehealth (HOSPITAL_COMMUNITY): Payer: Self-pay

## 2020-09-09 ENCOUNTER — Other Ambulatory Visit: Payer: Self-pay | Admitting: Internal Medicine

## 2020-09-09 DIAGNOSIS — L89890 Pressure ulcer of other site, unstageable: Secondary | ICD-10-CM | POA: Diagnosis not present

## 2020-09-09 DIAGNOSIS — I10 Essential (primary) hypertension: Secondary | ICD-10-CM | POA: Diagnosis not present

## 2020-09-09 DIAGNOSIS — L24A2 Irritant contact dermatitis due to fecal, urinary or dual incontinence: Secondary | ICD-10-CM | POA: Diagnosis not present

## 2020-09-09 NOTE — Progress Notes (Addendum)
Advanced Heart Failure Clinic Note   Primary Care: [name], NP-C Primary Cardiologist: Dr. Ellyn Hack HF Cardiologist: Dr. Jeffie Pollock  HPI:  Leonard [name] is a 75 y.o. 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   Admitted 06/09/16 with DOE and orthopnea. Not on diuretics PTA. Lost his daughter, and had been grieving and drinking more and eating worse. Diuresed 7 lbs. Cath with elevated filling pressures, low cardiac output (? In setting of fluid overload, and patent stents with otherwise non-obstructive CAD. Pt enrolled in [place] study. Started on lasix.   Seen by Dr. Caryl Comes 07/01/16 and transitioned to [facility].   Last seen in AHF by Dr. Haroldine Laws 06/2016.  Admitted 11/2018 for A/C CHF requiring milrinone due to low CO-OX and to assist with diuresis. Echo with EF 15-20%. He had LBBB and EP to consider CRT-D. Dig and Entresto held due to worsening kidney function. He was discharged to CIR.  Admitted 05/2019 for fall after consuming ETOH. BPs initially in 70s and SCr elevated. Ethanol 170 in ED, diuretics held and gently re-hydrated with IVF. Cardiology consulted, lasix restarted and spiro decreased to 12.5.   He has followed with Dr. Ellyn Hack with general cardiology.   Admitted 8/22 with hypotension and bradycardia, found after falling. SCr 3.85 and dig level 2.9 on admit, renal U/S negative for obstruction. BP improved with IVF. Cardiology consulted to assist with management. Echo showed EF <20%, no further plans for ischemia evaluation with acute renal insufficiency. GDMT held due to AKI and hypotension. Midodrine started and torsemide 20 added at discharge. SCr down to 2.7 on discharge. Hospitalization c/b acute urinary retention. He was discharged to SNF, weight  234 lbs.  Today he returns for post hospitalization HF follow up with his wife. Working with PT but still weak. Wants to go home. Has a sacral wound that is painful. Says he is not especially SOB when working with PT. Denies CP, dizziness, edema. +PND.  Appetite ok. No fever or chills. Unsure of his weight at facility. Taking all medications given at facility.   Cardiac Studies: - Echo (8/22): EF<20%, LV severely decreased, Grade [name] DD, moderate down RV, severe bi-atrial dilation, moderate MR/TR, moderate left pleural effusion, mild AS with calcification.  - Echo 06/09/16 20-25%, Grade 2 DD, Trivial AI, Mild MR, Mod LAE, PA peak pressure 53 mm Hg.  - Echo (11/20): EF 15-20%, global LV hypokinesis, mildly reduced RV, severe bi-atrial dilation, moderate TR, moderate AS with thickening  - [facility] 06/10/16  [place]amus lesion, 40 %stenosed. Ost RCA to Prox RCA lesion, 10 %stenosed. Mid RCA lesion, 0 %stenosed. Dist RCA lesion, 10 %stenosed. Ost LM to LM lesion, 30 %stenosed. Ost LAD lesion, 40 %stenosed. LV end diastolic pressure is moderately elevated. Hemodynamic findings consistent with mild pulmonary hypertension.   1. Nonobstructive CAD. Continued patency of stents in the RCA 2. Elevated LVEDP 3. Mild pulmonary HTN. 4. Reduced cardiac output. 3.89 L/min with index of 1.5  RHC Procedural Findings: Hemodynamics (mmHg) RA mean 16 mm Hg RV 42/11 mm Hg PA 43/23 (31) mm Hg  PCWP 23 mm Hg AO 104/72 mm Hg Cardiac Output (Fick) 3.89  Cardiac Index (Fick) 1.54  Review of Systems: [y] = yes, [ ]  = no   General: Weight gain [ ] ; Weight loss [ ] ;  Anorexia [ ] ; Fatigue Blue.Reese ]; Fever [ ] ; Chills [ ] ; Weakness [ y]  Cardiac: Chest pain/pressure [ ] ; Resting SOB [ ] ; Exertional SOB [ ] ; Orthopnea [ ] ; Pedal Edema [ ] ; Palpitations [ ] ; Syncope [ ] ; Presyncope [ ] ; Paroxysmal nocturnal dyspnea[y ]  Pulmonary: Cough [ ] ; Wheezing[ ] ; Hemoptysis[ ] ; Sputum [ ] ; Snoring [ ]   GI: Vomiting[ ] ; Dysphagia [ ] ;  Melena[ ] ; Hematochezia [ ] ; Heartburn[ ] ; Abdominal pain [ ] ; Constipation [ ] ; Diarrhea [ ] ; BRBPR [ ]   GU: Hematuria[ ] ; Dysuria [ ] ; Nocturia[ ]  Vascular: Pain in legs with walking [ ] ; Pain in feet with lying flat [ ] ; Non-healing sores Blue.Reese ]; Stroke [ ] ; TIA [ ] ; Slurred speech [ ] ;  Neuro: Headaches[ ] ; Vertigo[ ] ; Seizures[ ] ; Paresthesias[ ] ;Blurred vision [ ] ; Diplopia [ ] ; Vision changes [ ]   Ortho/Skin: Arthritis [ ] ; Joint pain [ ] ; Muscle pain [ ] ; Joint swelling [ ] ; Back Pain [ ] ; Rash [ ]   Psych: Depression[y ]; Anxiety[ ]   Heme: Bleeding problems [ ] ; Clotting disorders [ ] ; Anemia [ ]   Endocrine: Diabetes Blue.Reese ]; Thyroid dysfunction[y ]   Past Medical History:  Diagnosis Date   AICD (automatic cardioverter/defibrillator) present 08/26/2015   STJ   Anxiety    Atherosclerotic heart disease of native coronary artery with angina pectoris ([place]) 07/2015   a. 07/2015 Posterior STEMI/PCI: LM nl, LAd 40ost, RI 40, RCA 168m (3.0x22 Resolute Integrity DES distal, 3.0x12 Resolute Integrity DES prox), 90d (PTCA), RPDA small, nl, RPLB2 100 - too small for PTCA. -- patent in f/u cath 08/2015 & 05/2016 - patent RCA stents (~10%), RI 40%, LM ~30% ost LAD ~40%.   Chronic combined systolic and diastolic CHF (congestive heart failure) ([place]) 07/2015   a. 07/2015 [facility]co: EF 45-50%, basal-mid inf and infsept AK, inflat, apical inf, and apical septal HK, Gr1 DD, triv MR.;; b - Echo 05/2016 -> EF 20-25%, Gr 2 DD.  Severe diffuse HK disproportionately severe HK and thinning of apex, anterior septum and anterior wall-with septal dyssynchrony.  PAP ~53 mmHg.   Hyperlipidemia with target low density lipoprotein (LDL) cholesterol less than 70 mg/dL    Hypertensive heart disease    Ischemic cardiomyopathy 07/2015 - 05/2016   a. 07/2015 Echo: EF 45-50%.; Follow-up Echo 08/23/2015: EF 50-55% with mild inferior hypokinesis --> 05/2016: EF 20-25%, diffuse HK. PAP ~53 mmHg.   Kidney disease, chronic, stage [name] (GFR 30-59  ml/min) (HCC) 08/12/2015   Morbid obesity (HCC)    ST elevation myocardial infarction (STEMI) of inferior wall ([place]) 08/12/2015   Subacute presentation for inferior MI with post infarction angina on the following day. EKG still shows injury current. Therefore decided to proceed with intervention of 100% mRCA thrombotic lesion.   Sustained ventricular fibrillation ([place]) 08/22/2015   Underwent EP evaluation with final ICD implantation.   Current Outpatient Medications  Medication Sig Dispense Refill   acetaminophen (TYLENOL) 325 MG tablet Take 2 tablets (650 mg total) by mouth every 4 (four) hours as needed for headache or mild pain.     amiodarone (PACERONE) 100 MG tablet Take 100 mg by mouth daily.     atorvastatin (LIPITOR) 80 MG tablet Take 1 tablet (80 mg total) by mouth daily. TAKE 1 TABLET BY MOUTH DAILY AT 6:00 PM. 90 tablet 2   BRILINTA 60 MG TABS tablet TAKE 1 TABLET(60 MG) BY MOUTH TWICE DAILY 60 tablet 11   clotrimazole (LOTRIMIN) 1 % cream Apply  topically 2 (two) times daily. 30 g 0   empagliflozin (JARDIANCE) 10 MG TABS tablet Take 1 tablet (10 mg total) by mouth daily. 90 tablet 3   ezetimibe (ZETIA) 10 MG tablet Take 1 tablet (10 mg total) by mouth daily. 90 tablet 3   levothyroxine (SYNTHROID) 125 MCG tablet TAKE 1 TABLET BY MOUTH EVERY DAY 90 tablet 0   midodrine (PROAMATINE) 5 MG tablet Take 1 tablet (5 mg total) by mouth 3 (three) times daily with meals.     Multiple Vitamins-Minerals (MULTIVITAMIN ADULT EXTRA C PO) Take 1 tablet by mouth daily after breakfast.     Nutritional Supplements (NUTRITIONAL SUPPLEMENT PO) Take 1 tablet by mouth daily.     ondansetron (ZOFRAN) 4 MG tablet Take 1 tablet (4 mg total) by mouth every 8 (eight) hours as needed for nausea or vomiting. 20 tablet 1   tamsulosin (FLOMAX) 0.4 MG CAPS capsule Take 1 capsule (0.4 mg total) by mouth daily after breakfast. 30 capsule    torsemide (DEMADEX) 20 MG tablet Take 1 tablet (20 mg total) by mouth daily.  TAKE 1 TABLET(20 MG) BY MOUTH DAILY     venlafaxine (EFFEXOR) 75 MG tablet TAKE 1 TABLET(75 MG) BY MOUTH DAILY 30 tablet 2   No current facility-administered medications for this encounter.   Allergies  Allergen Reactions   Clams [Shellfish Allergy] Shortness Of Breath, Itching and Swelling   Shellfish Allergy Anaphylaxis   Shellfish-Derived Products Shortness Of Breath, Itching and Swelling   Sulfa Antibiotics Other (See Comments)    unknown   Other Itching and Other (See Comments)    Ragweed causes watery eyes, itching, sneezing   Sulfa Antibiotics Other (See Comments)    Pt unsure of reaction, mother told him he was allergic    Social History   Socioeconomic History   Marital status: Married    Spouse name: Not on file   Number of children: Not on file   Years of education: Not on file   Highest education level: Not on file  Occupational History   Occupation: Chief Executive Officer    Comment: Retired  Tobacco Use   Smoking status: Never   Smokeless tobacco: Never  Vaping Use   Vaping Use: Never used  Substance and Sexual Activity   Alcohol use: Yes    Alcohol/week: 3.0 - 4.0 standard drinks    Types: 3 - 4 Shots of liquor per week    Comment: 1-1/2 glasses of scotch daily   Drug use: No   Sexual activity: Not on file  Other Topics Concern   Not on file  Social History Narrative   He tells me that his daughter died on Halloween 2015/04/27. She had a diving accident at the age of 6 and been quadriplegic for 25 years. She had been incredibly vigorous girl. He showed me her high school graduation picture.She was as he reported beautiful.  Her name was [name].      He still lives with his wife, [name].  He does have a living will and [name] is his Ambulance person.      Is a former heavy scotch drinker.  Now cut down from 5-6 drinks a day to 1 only.   Social Determinants of Health   Financial Resource Strain: Not on file  Food Insecurity: Not on file  Transportation Needs: Not on  file  Physical Activity: Not on file  Stress: Not on file  Social Connections: Not on file  Intimate Partner Violence: Not on file  Family History  Problem Relation Age of Onset   Diabetes Mother    Heart disease Paternal Grandmother    Heart disease Paternal Grandfather    BP 100/60   Pulse 61   Wt 113.9 kg (251 lb 3.2 oz)   SpO2 100%   BMI 33.14 kg/m   Wt Readings from Last 3 Encounters:  09/10/20 113.9 kg (251 lb 3.2 oz)  09/05/20 106.5 kg (234 lb 12.6 oz)  08/28/20 106.6 kg (235 lb)    PHYSICAL EXAM: General:  NAD. No resp difficulty, chronically-ill appearing, arrived in [facility]  HEENT: Normal Neck: Supple. JVP to ear. Carotids 2+ bilat; no bruits. No lymphadenopathy or thryomegaly appreciated. Cor: PMI nondisplaced. Regular rate & rhythm. No rubs, gallops or murmurs. Lungs: Clear Abdomen: Obese, nontender, + distended. No hepatosplenomegaly. No bruits or masses. Good bowel sounds. Extremities: No cyanosis, clubbing, rash, 2-3+ LE edema to knees Neuro: Alert & oriented x 3, cranial nerves grossly intact. Moves all 4 extremities w/o difficulty. Affect pleasant, poor insight into current health state.  ECG: SR with LBBB 62 bpm (personally reviewed).  ICD interrogation: Thoracic impedence down suggesting increased fluid levels, no VT (personally reviewed).  ASSESSMENT & PLAN: 1. Acute on Chronic Systolic Heart Failure: - Echo 06/09/16 20-25%, Grade 2 DD, Trivial AI, Mild MR, Mod LAE, PA peak pressure 53 mm Hg. - Echo (8/22): EF<20%, LV severely decreased, Grade [name] DD, moderate down RV, severe bi-atrial dilation, moderate MR/TR, moderate left pleural effusion, mild AS with calcification. - NYHA IIIb, functional class confounded by physical deconditioning, although he denies marked dyspnea with physical therapy, unclear how much he is actually able to participate. - GDMT recently limited by hypotension and AKI. - Volume markedly elevated on exam and by device interrogation,  weight up 17 lbs from discharge. - Increase torsemide to 40 mg bid x 5 days, then 40 mg daily (previously on 20 mg daily). - Continue Jardiance 10 mg daily. May need to stop this pending BMET today. - Continue midodrine 5 mg tid. - No beta blocker with recent hypotension. - No spiro/ARNI with recent AKI on CKD.  - No digoxin with elevated level. Would not restart. - BMET and BNP today, repeat BMET in 10 days.  2. CAD  - s/p posterior STEMI/PCI with two overlapping DES to RCA 07/2015 - Stable, Non-obstructive CAD on cath 05/2016 - No chest pain.  - Cont ASA, statin, and Brilinta.  3. H/O of VT - Continue amiodarone 100 mg daily (reduced due to nausea). - No VT on device interrogation.  4. CKD [name]: - Baseline SCr 2.6. - Followed by Dr. Joelyn Oms. Has appt in Sept. - Labs today.  5. HTN - Meds as above.   6. HLD - Continue atorvastatin 80 + Zetia. - Per Dr. Ellyn Hack.  7. Physical deconditioning - Continue PT/OT at SNF - Wants to go home.  8. GOC - Patient has poor insight into his current state of health, wife indicates to me that she knows he will not be ready to go home safely anytime soon.  - Would benefit from palliative support.  Will follow kidney function today and have device RN send transmission in 7 days to follow fluid trend. If not trending back to baseline, will have him return to clinic sooner.  Otherwise, will follow up with Dr. Haroldine Laws in 8-10 weeks.  [place], FNP 09/10/20  8:47 PM

## 2020-09-09 NOTE — Telephone Encounter (Signed)
Called to confirm/remind patient of their appointment at the [facility] on 09/10/20. (Spoke to patient's spouse)  Patient reminded to bring all medications and/or complete list.  Confirmed patient has transportation. Gave directions, instructed to utilize [facility] parking.  Confirmed appointment prior to ending call.

## 2020-09-10 ENCOUNTER — Ambulatory Visit (HOSPITAL_BASED_OUTPATIENT_CLINIC_OR_DEPARTMENT_OTHER)
Admit: 2020-09-10 | Discharge: 2020-09-10 | Disposition: A | Discharge status: Home / Self Care | Payer: Medicare Other | Source: Ambulatory Visit | Attending: Family Medicine | Admitting: Family Medicine

## 2020-09-10 ENCOUNTER — Telehealth (HOSPITAL_COMMUNITY): Payer: Self-pay | Admitting: Family Medicine

## 2020-09-10 ENCOUNTER — Encounter (HOSPITAL_COMMUNITY): Payer: Self-pay

## 2020-09-10 ENCOUNTER — Other Ambulatory Visit: Payer: Self-pay

## 2020-09-10 ENCOUNTER — Other Ambulatory Visit (HOSPITAL_COMMUNITY): Payer: Self-pay | Admitting: Family Medicine

## 2020-09-10 VITALS — BP 100/60 | HR 61 | Wt 251.2 lb

## 2020-09-10 DIAGNOSIS — I130 Hypertensive heart and chronic kidney disease with heart failure and stage 1 through stage 4 chronic kidney disease, or unspecified chronic kidney disease: Secondary | ICD-10-CM | POA: Insufficient documentation

## 2020-09-10 DIAGNOSIS — Z7189 Other specified counseling: Secondary | ICD-10-CM | POA: Diagnosis not present

## 2020-09-10 DIAGNOSIS — I2510 Atherosclerotic heart disease of native coronary artery without angina pectoris: Secondary | ICD-10-CM

## 2020-09-10 DIAGNOSIS — R531 Weakness: Secondary | ICD-10-CM | POA: Insufficient documentation

## 2020-09-10 DIAGNOSIS — I10 Essential (primary) hypertension: Secondary | ICD-10-CM

## 2020-09-10 DIAGNOSIS — R5381 Other malaise: Secondary | ICD-10-CM | POA: Diagnosis not present

## 2020-09-10 DIAGNOSIS — N183 Chronic kidney disease, stage 3 unspecified: Secondary | ICD-10-CM | POA: Insufficient documentation

## 2020-09-10 DIAGNOSIS — Z79899 Other long term (current) drug therapy: Secondary | ICD-10-CM | POA: Insufficient documentation

## 2020-09-10 DIAGNOSIS — E785 Hyperlipidemia, unspecified: Secondary | ICD-10-CM | POA: Diagnosis not present

## 2020-09-10 DIAGNOSIS — N1832 Chronic kidney disease, stage 3b: Secondary | ICD-10-CM | POA: Diagnosis not present

## 2020-09-10 DIAGNOSIS — I5020 Unspecified systolic (congestive) heart failure: Secondary | ICD-10-CM | POA: Diagnosis not present

## 2020-09-10 DIAGNOSIS — Z7984 Long term (current) use of oral hypoglycemic drugs: Secondary | ICD-10-CM | POA: Insufficient documentation

## 2020-09-10 DIAGNOSIS — Z8249 Family history of ischemic heart disease and other diseases of the circulatory system: Secondary | ICD-10-CM | POA: Insufficient documentation

## 2020-09-10 DIAGNOSIS — I252 Old myocardial infarction: Secondary | ICD-10-CM | POA: Insufficient documentation

## 2020-09-10 DIAGNOSIS — I5023 Acute on chronic systolic (congestive) heart failure: Secondary | ICD-10-CM

## 2020-09-10 DIAGNOSIS — J90 Pleural effusion, not elsewhere classified: Secondary | ICD-10-CM | POA: Insufficient documentation

## 2020-09-10 DIAGNOSIS — I472 Ventricular tachycardia, unspecified: Secondary | ICD-10-CM

## 2020-09-10 LAB — BRAIN NATRIURETIC PEPTIDE: B Natriuretic Peptide: 2856.3 pg/mL — ABNORMAL HIGH (ref 0.0–100.0)

## 2020-09-10 LAB — BASIC METABOLIC PANEL
Anion gap: 11 (ref 5–15)
BUN: 78 mg/dL — ABNORMAL HIGH (ref 8–23)
CO2: 18 mmol/L — ABNORMAL LOW (ref 22–32)
Calcium: 8.4 mg/dL — ABNORMAL LOW (ref 8.9–10.3)
Chloride: 97 mmol/L — ABNORMAL LOW (ref 98–111)
Creatinine, Ser: 3.78 mg/dL — ABNORMAL HIGH (ref 0.61–1.24)
GFR, Estimated: 16 mL/min — ABNORMAL LOW (ref >60)
Glucose, Bld: 99 mg/dL (ref 70–99)
Potassium: 5.1 mmol/L (ref 3.5–5.1)
Sodium: 126 mmol/L — ABNORMAL LOW (ref 135–145)

## 2020-09-10 MED ORDER — TORSEMIDE 20 MG PO TABS: ORAL_TABLET | ORAL | 0 refills | Status: AC

## 2020-09-10 NOTE — Patient Instructions (Signed)
INCREASE Torsemide to 40 mg twice a day for 5 days, then decrease to 40 mg daily thereafter  Labs today We will only contact you if something comes back abnormal or we need to make some changes. Otherwise no news is good news!  Labs needed in 7-10 days  Your physician recommends that you schedule a follow-up appointment in: 6-8 weeks with Dr Haroldine Laws  Do the following things EVERYDAY: Weigh yourself in the morning before breakfast. Write it down and keep it in a log. Take your medicines as prescribed Eat low salt foods--Limit salt (sodium) to 2000 mg per day.  Stay as active as you can everyday Limit all fluids for the day to less than 2 liters

## 2020-09-10 NOTE — Telephone Encounter (Signed)
Spoke to patient's wife [name] about patient's worsening kidney function. After discussion with Dr. Haroldine Laws, recommend palliative services for [facility]. She is agreeable with plan and appreciated phone call.   Allena Katz, FNP-BC

## 2020-09-11 ENCOUNTER — Emergency Department (HOSPITAL_COMMUNITY): Payer: Medicare Other

## 2020-09-11 ENCOUNTER — Inpatient Hospital Stay (HOSPITAL_COMMUNITY)
Admission: EM | Admit: 2020-09-11 | Discharge: 2020-09-19 | DRG: 951 | Disposition: E | Payer: Medicare Other | Attending: Internal Medicine | Admitting: Internal Medicine

## 2020-09-11 ENCOUNTER — Encounter (HOSPITAL_COMMUNITY): Payer: Self-pay | Admitting: Cardiology

## 2020-09-11 DIAGNOSIS — M47812 Spondylosis without myelopathy or radiculopathy, cervical region: Secondary | ICD-10-CM | POA: Diagnosis not present

## 2020-09-11 DIAGNOSIS — E875 Hyperkalemia: Secondary | ICD-10-CM | POA: Diagnosis present

## 2020-09-11 DIAGNOSIS — R0902 Hypoxemia: Secondary | ICD-10-CM | POA: Diagnosis not present

## 2020-09-11 DIAGNOSIS — J9811 Atelectasis: Secondary | ICD-10-CM | POA: Diagnosis not present

## 2020-09-11 DIAGNOSIS — Y92122 Bedroom in nursing home as the place of occurrence of the external cause: Secondary | ICD-10-CM

## 2020-09-11 DIAGNOSIS — Z91013 Allergy to seafood: Secondary | ICD-10-CM

## 2020-09-11 DIAGNOSIS — W06XXXA Fall from bed, initial encounter: Secondary | ICD-10-CM | POA: Diagnosis present

## 2020-09-11 DIAGNOSIS — N179 Acute kidney failure, unspecified: Secondary | ICD-10-CM

## 2020-09-11 DIAGNOSIS — I517 Cardiomegaly: Secondary | ICD-10-CM | POA: Diagnosis not present

## 2020-09-11 DIAGNOSIS — Z20822 Contact with and (suspected) exposure to covid-19: Secondary | ICD-10-CM | POA: Diagnosis present

## 2020-09-11 DIAGNOSIS — S301XXA Contusion of abdominal wall, initial encounter: Secondary | ICD-10-CM | POA: Diagnosis present

## 2020-09-11 DIAGNOSIS — S61411A Laceration without foreign body of right hand, initial encounter: Secondary | ICD-10-CM | POA: Diagnosis present

## 2020-09-11 DIAGNOSIS — J301 Allergic rhinitis due to pollen: Secondary | ICD-10-CM | POA: Diagnosis present

## 2020-09-11 DIAGNOSIS — I5043 Acute on chronic combined systolic (congestive) and diastolic (congestive) heart failure: Secondary | ICD-10-CM | POA: Diagnosis present

## 2020-09-11 DIAGNOSIS — R339 Retention of urine, unspecified: Secondary | ICD-10-CM | POA: Diagnosis present

## 2020-09-11 DIAGNOSIS — J90 Pleural effusion, not elsewhere classified: Secondary | ICD-10-CM | POA: Diagnosis not present

## 2020-09-11 DIAGNOSIS — I1310 Hypertensive heart and chronic kidney disease without heart failure, with stage 1 through stage 4 chronic kidney disease, or unspecified chronic kidney disease: Secondary | ICD-10-CM

## 2020-09-11 DIAGNOSIS — I5084 End stage heart failure: Secondary | ICD-10-CM | POA: Diagnosis present

## 2020-09-11 DIAGNOSIS — I5023 Acute on chronic systolic (congestive) heart failure: Secondary | ICD-10-CM

## 2020-09-11 DIAGNOSIS — Z66 Do not resuscitate: Secondary | ICD-10-CM | POA: Diagnosis present

## 2020-09-11 DIAGNOSIS — R063 Periodic breathing: Secondary | ICD-10-CM | POA: Diagnosis not present

## 2020-09-11 DIAGNOSIS — I130 Hypertensive heart and chronic kidney disease with heart failure and stage 1 through stage 4 chronic kidney disease, or unspecified chronic kidney disease: Secondary | ICD-10-CM | POA: Diagnosis present

## 2020-09-11 DIAGNOSIS — Z789 Other specified health status: Secondary | ICD-10-CM | POA: Diagnosis not present

## 2020-09-11 DIAGNOSIS — N184 Chronic kidney disease, stage 4 (severe): Secondary | ICD-10-CM | POA: Diagnosis present

## 2020-09-11 DIAGNOSIS — Z515 Encounter for palliative care: Principal | ICD-10-CM

## 2020-09-11 DIAGNOSIS — E871 Hypo-osmolality and hyponatremia: Secondary | ICD-10-CM

## 2020-09-11 DIAGNOSIS — Z79899 Other long term (current) drug therapy: Secondary | ICD-10-CM

## 2020-09-11 DIAGNOSIS — Z95810 Presence of automatic (implantable) cardiac defibrillator: Secondary | ICD-10-CM

## 2020-09-11 DIAGNOSIS — R296 Repeated falls: Secondary | ICD-10-CM | POA: Diagnosis present

## 2020-09-11 DIAGNOSIS — N189 Chronic kidney disease, unspecified: Secondary | ICD-10-CM

## 2020-09-11 DIAGNOSIS — I447 Left bundle-branch block, unspecified: Secondary | ICD-10-CM | POA: Diagnosis present

## 2020-09-11 DIAGNOSIS — L8990 Pressure ulcer of unspecified site, unspecified stage: Secondary | ICD-10-CM | POA: Insufficient documentation

## 2020-09-11 DIAGNOSIS — S0083XA Contusion of other part of head, initial encounter: Secondary | ICD-10-CM | POA: Diagnosis present

## 2020-09-11 DIAGNOSIS — Z8249 Family history of ischemic heart disease and other diseases of the circulatory system: Secondary | ICD-10-CM

## 2020-09-11 DIAGNOSIS — S0003XA Contusion of scalp, initial encounter: Secondary | ICD-10-CM | POA: Diagnosis present

## 2020-09-11 DIAGNOSIS — E872 Acidosis: Secondary | ICD-10-CM | POA: Diagnosis present

## 2020-09-11 DIAGNOSIS — M5136 Other intervertebral disc degeneration, lumbar region: Secondary | ICD-10-CM | POA: Diagnosis not present

## 2020-09-11 DIAGNOSIS — S60511A Abrasion of right hand, initial encounter: Secondary | ICD-10-CM | POA: Diagnosis not present

## 2020-09-11 DIAGNOSIS — L89312 Pressure ulcer of right buttock, stage 2: Secondary | ICD-10-CM | POA: Diagnosis present

## 2020-09-11 DIAGNOSIS — R0681 Apnea, not elsewhere classified: Secondary | ICD-10-CM | POA: Diagnosis not present

## 2020-09-11 DIAGNOSIS — I701 Atherosclerosis of renal artery: Secondary | ICD-10-CM | POA: Diagnosis not present

## 2020-09-11 DIAGNOSIS — Z7984 Long term (current) use of oral hypoglycemic drugs: Secondary | ICD-10-CM

## 2020-09-11 DIAGNOSIS — R627 Adult failure to thrive: Secondary | ICD-10-CM | POA: Diagnosis present

## 2020-09-11 DIAGNOSIS — I509 Heart failure, unspecified: Secondary | ICD-10-CM

## 2020-09-11 DIAGNOSIS — I5082 Biventricular heart failure: Secondary | ICD-10-CM | POA: Diagnosis present

## 2020-09-11 DIAGNOSIS — R0989 Other specified symptoms and signs involving the circulatory and respiratory systems: Secondary | ICD-10-CM | POA: Diagnosis not present

## 2020-09-11 DIAGNOSIS — R570 Cardiogenic shock: Secondary | ICD-10-CM

## 2020-09-11 DIAGNOSIS — E861 Hypovolemia: Secondary | ICD-10-CM | POA: Diagnosis present

## 2020-09-11 DIAGNOSIS — I110 Hypertensive heart disease with heart failure: Secondary | ICD-10-CM | POA: Diagnosis not present

## 2020-09-11 DIAGNOSIS — S299XXA Unspecified injury of thorax, initial encounter: Secondary | ICD-10-CM | POA: Diagnosis not present

## 2020-09-11 DIAGNOSIS — Z79890 Hormone replacement therapy (postmenopausal): Secondary | ICD-10-CM

## 2020-09-11 DIAGNOSIS — Z043 Encounter for examination and observation following other accident: Secondary | ICD-10-CM | POA: Diagnosis not present

## 2020-09-11 DIAGNOSIS — I2510 Atherosclerotic heart disease of native coronary artery without angina pectoris: Secondary | ICD-10-CM | POA: Diagnosis present

## 2020-09-11 DIAGNOSIS — I252 Old myocardial infarction: Secondary | ICD-10-CM

## 2020-09-11 DIAGNOSIS — I255 Ischemic cardiomyopathy: Secondary | ICD-10-CM | POA: Diagnosis present

## 2020-09-11 DIAGNOSIS — Z4502 Encounter for adjustment and management of automatic implantable cardiac defibrillator: Secondary | ICD-10-CM

## 2020-09-11 DIAGNOSIS — J811 Chronic pulmonary edema: Secondary | ICD-10-CM | POA: Diagnosis not present

## 2020-09-11 DIAGNOSIS — T1490XA Injury, unspecified, initial encounter: Secondary | ICD-10-CM

## 2020-09-11 DIAGNOSIS — E785 Hyperlipidemia, unspecified: Secondary | ICD-10-CM | POA: Diagnosis present

## 2020-09-11 DIAGNOSIS — W19XXXA Unspecified fall, initial encounter: Secondary | ICD-10-CM | POA: Diagnosis not present

## 2020-09-11 DIAGNOSIS — I728 Aneurysm of other specified arteries: Secondary | ICD-10-CM | POA: Diagnosis not present

## 2020-09-11 DIAGNOSIS — Z7902 Long term (current) use of antithrombotics/antiplatelets: Secondary | ICD-10-CM

## 2020-09-11 DIAGNOSIS — I959 Hypotension, unspecified: Secondary | ICD-10-CM | POA: Diagnosis not present

## 2020-09-11 DIAGNOSIS — D171 Benign lipomatous neoplasm of skin and subcutaneous tissue of trunk: Secondary | ICD-10-CM | POA: Diagnosis not present

## 2020-09-11 DIAGNOSIS — Z955 Presence of coronary angioplasty implant and graft: Secondary | ICD-10-CM

## 2020-09-11 HISTORY — DX: Essential (primary) hypertension: I10

## 2020-09-11 HISTORY — DX: Ventricular tachycardia: I47.2

## 2020-09-11 HISTORY — DX: Atherosclerotic heart disease of native coronary artery without angina pectoris: I25.10

## 2020-09-11 HISTORY — DX: Ventricular tachycardia, unspecified: I47.20

## 2020-09-11 HISTORY — DX: Presence of automatic (implantable) cardiac defibrillator: Z95.810

## 2020-09-11 HISTORY — DX: Chronic systolic (congestive) heart failure: I50.22

## 2020-09-11 HISTORY — DX: Hyperlipidemia, unspecified: E78.5

## 2020-09-11 HISTORY — DX: Chronic kidney disease, stage 3 unspecified: N18.30

## 2020-09-11 LAB — RESP PANEL BY RT-PCR (FLU A&B, COVID) ARPGX2
Influenza A by PCR: NEGATIVE
Influenza B by PCR: NEGATIVE
SARS Coronavirus 2 by RT PCR: NEGATIVE

## 2020-09-11 LAB — COMPREHENSIVE METABOLIC PANEL
ALT: 40 U/L (ref 0–44)
AST: 38 U/L (ref 15–41)
Albumin: 3 g/dL — ABNORMAL LOW (ref 3.5–5.0)
Alkaline Phosphatase: 114 U/L (ref 38–126)
Anion gap: 11 (ref 5–15)
BUN: 88 mg/dL — ABNORMAL HIGH (ref 8–23)
CO2: 16 mmol/L — ABNORMAL LOW (ref 22–32)
Calcium: 8.1 mg/dL — ABNORMAL LOW (ref 8.9–10.3)
Chloride: 100 mmol/L (ref 98–111)
Creatinine, Ser: 4.37 mg/dL — ABNORMAL HIGH (ref 0.61–1.24)
GFR, Estimated: 13 mL/min — ABNORMAL LOW (ref >60)
Glucose, Bld: 94 mg/dL (ref 70–99)
Potassium: 5.4 mmol/L — ABNORMAL HIGH (ref 3.5–5.1)
Sodium: 127 mmol/L — ABNORMAL LOW (ref 135–145)
Total Bilirubin: 2.2 mg/dL — ABNORMAL HIGH (ref 0.3–1.2)
Total Protein: 5.6 g/dL — ABNORMAL LOW (ref 6.5–8.1)

## 2020-09-11 LAB — DIGOXIN LEVEL: Digoxin Level: 1.5 ng/mL (ref 0.8–2.0)

## 2020-09-11 LAB — ETHANOL: Alcohol, Ethyl (B): <10 mg/dL (ref <10)

## 2020-09-11 LAB — PROTIME-INR
INR: 1.3 — ABNORMAL HIGH (ref 0.8–1.2)
Prothrombin Time: 16.3 seconds — ABNORMAL HIGH (ref 11.4–15.2)

## 2020-09-11 LAB — LACTIC ACID, PLASMA
Lactic Acid, Venous: 2.5 mmol/L (ref 0.5–1.9)
Lactic Acid, Venous: 1.7 mmol/L (ref 0.5–1.9)

## 2020-09-11 LAB — I-STAT CHEM 8, ED
BUN: 84 mg/dL — ABNORMAL HIGH (ref 8–23)
Calcium, Ion: 0.53 mmol/L — CL (ref 1.15–1.40)
Chloride: 97 mmol/L — ABNORMAL LOW (ref 98–111)
Creatinine, Ser: 4.6 mg/dL — ABNORMAL HIGH (ref 0.61–1.24)
Glucose, Bld: 89 mg/dL (ref 70–99)
HCT: 41 % (ref 39.0–52.0)
Hemoglobin: 13.9 g/dL (ref 13.0–17.0)
Potassium: 5.2 mmol/L — ABNORMAL HIGH (ref 3.5–5.1)
Sodium: 129 mmol/L — ABNORMAL LOW (ref 135–145)
TCO2: 19 mmol/L — ABNORMAL LOW (ref 22–32)

## 2020-09-11 LAB — CBC
HCT: 42.5 % (ref 39.0–52.0)
Hemoglobin: 12.4 g/dL — ABNORMAL LOW (ref 13.0–17.0)
MCH: 26.6 pg (ref 26.0–34.0)
MCHC: 29.2 g/dL — ABNORMAL LOW (ref 30.0–36.0)
MCV: 91 fL (ref 80.0–100.0)
Platelets: 240 10*3/uL (ref 150–400)
RBC: 4.67 MIL/uL (ref 4.22–5.81)
RDW: 17.8 % — ABNORMAL HIGH (ref 11.5–15.5)
WBC: 11.4 10*3/uL — ABNORMAL HIGH (ref 4.0–10.5)
nRBC: 0.3 % — ABNORMAL HIGH (ref 0.0–0.2)

## 2020-09-11 LAB — SAMPLE TO BLOOD BANK

## 2020-09-11 MED ORDER — SODIUM CHLORIDE 0.9% FLUSH: 3.0000 mL | Freq: Two times a day (BID) | INTRAVENOUS | Status: DC

## 2020-09-11 MED ORDER — SODIUM CHLORIDE 0.9 % IV SOLN: 250.0000 mL | INTRAVENOUS | Status: DC | PRN

## 2020-09-11 MED ORDER — LORAZEPAM 2 MG/ML IJ SOLN: 1.0000 mg | INTRAMUSCULAR | Status: DC | PRN

## 2020-09-11 MED ORDER — SODIUM CHLORIDE 0.9 % IV SOLN: INTRAVENOUS | Status: DC

## 2020-09-11 MED ORDER — HALOPERIDOL LACTATE 2 MG/ML PO CONC
0.5000 mg | ORAL | Status: DC | PRN
  Filled 2020-09-11: qty 0.3

## 2020-09-11 MED ORDER — LORAZEPAM 1 MG PO TABS: 1.0000 mg | ORAL_TABLET | ORAL | Status: DC | PRN

## 2020-09-11 MED ORDER — HALOPERIDOL 0.5 MG PO TABS
0.5000 mg | ORAL_TABLET | ORAL | Status: DC | PRN
  Filled 2020-09-11: qty 1

## 2020-09-11 MED ORDER — ACETAMINOPHEN 650 MG RE SUPP: 650.0000 mg | Freq: Four times a day (QID) | RECTAL | Status: DC | PRN

## 2020-09-11 MED ORDER — MORPHINE SULFATE (PF) 2 MG/ML IV SOLN: 1.0000 mg | INTRAVENOUS | Status: DC | PRN

## 2020-09-11 MED ORDER — LORAZEPAM 2 MG/ML PO CONC: 1.0000 mg | ORAL | Status: DC | PRN

## 2020-09-11 MED ORDER — ACETAMINOPHEN 325 MG PO TABS: 650.0000 mg | ORAL_TABLET | Freq: Four times a day (QID) | ORAL | Status: DC | PRN

## 2020-09-11 MED ORDER — DIPHENHYDRAMINE HCL 50 MG/ML IJ SOLN
12.5000 mg | INTRAMUSCULAR | Status: DC | PRN
  Administered 2020-09-12: 12.5 mg via INTRAVENOUS
  Filled 2020-09-11: qty 1

## 2020-09-11 MED ORDER — POLYVINYL ALCOHOL 1.4 % OP SOLN
1.0000 [drp] | Freq: Four times a day (QID) | OPHTHALMIC | Status: DC | PRN
  Filled 2020-09-11: qty 15

## 2020-09-11 MED ORDER — HYDROMORPHONE HCL 1 MG/ML IJ SOLN: 1.0000 mg | INTRAMUSCULAR | Status: DC | PRN

## 2020-09-11 MED ORDER — MORPHINE BOLUS VIA INFUSION
2.0000 mg | INTRAVENOUS | Status: DC | PRN
  Filled 2020-09-11: qty 2

## 2020-09-11 MED ORDER — GLYCOPYRROLATE 0.2 MG/ML IJ SOLN
0.2000 mg | INTRAMUSCULAR | Status: DC | PRN
  Filled 2020-09-11: qty 1

## 2020-09-11 MED ORDER — LORAZEPAM 2 MG/ML IJ SOLN
1.0000 mg | INTRAMUSCULAR | Status: DC | PRN
  Filled 2020-09-11: qty 1

## 2020-09-11 MED ORDER — GLYCOPYRROLATE 1 MG PO TABS
1.0000 mg | ORAL_TABLET | ORAL | Status: DC | PRN
  Filled 2020-09-11: qty 1

## 2020-09-11 MED ORDER — HALOPERIDOL LACTATE 5 MG/ML IJ SOLN: 0.5000 mg | INTRAMUSCULAR | Status: DC | PRN

## 2020-09-11 MED ORDER — GLYCOPYRROLATE 0.2 MG/ML IJ SOLN
0.2000 mg | INTRAMUSCULAR | Status: DC | PRN
  Administered 2020-09-13: 0.2 mg via INTRAVENOUS

## 2020-09-11 MED ORDER — MORPHINE 100MG IN NS 100ML (1MG/ML) PREMIX INFUSION
1.0000 mg/h | INTRAVENOUS | Status: DC
  Administered 2020-09-11: 2 mg/h via INTRAVENOUS
  Filled 2020-09-11: qty 100

## 2020-09-11 MED ORDER — BIOTENE DRY MOUTH MT LIQD: 15.0000 mL | OROMUCOSAL | Status: DC | PRN

## 2020-09-11 MED ORDER — SODIUM CHLORIDE 0.9 % IV BOLUS
1000.0000 mL | Freq: Once | INTRAVENOUS | Status: AC
  Administered 2020-09-11: 1000 mL via INTRAVENOUS

## 2020-09-11 MED ORDER — ONDANSETRON HCL 4 MG/2ML IJ SOLN: 4.0000 mg | Freq: Four times a day (QID) | INTRAMUSCULAR | Status: DC | PRN

## 2020-09-11 MED ORDER — ONDANSETRON 4 MG PO TBDP: 4.0000 mg | ORAL_TABLET | Freq: Four times a day (QID) | ORAL | Status: DC | PRN

## 2020-09-11 MED ORDER — SODIUM CHLORIDE 0.9% FLUSH: 3.0000 mL | INTRAVENOUS | Status: DC | PRN

## 2020-09-11 NOTE — ED Provider Notes (Signed)
  Physical Exam  BP (!) 78/57   Pulse (!) 45   Temp (!) 96.7 F (35.9 C) (Temporal)   Resp 18   Ht 6\' 1"  (1.854 m)   Wt 113.4 kg   SpO2 (!) 86%   BMI 32.98 kg/m   Physical Exam  ED Course/Procedures     Procedures  MDM  Care assumed at 3 PM.  Patient is from nursing home and has recurrent falls.  Patient is hypotensive and came in as a level 2 trauma.  Palliative care saw patient.  Patient is full palliative.  Signout pending [facility] placement for inpatient hospice.  5:10 PM   I was notified by the nurse that there is no beds available right now.  Patient is on morphine and Ativan as needed.  Patient is DNR.  Patient will be admitted for comfort measures       Drenda Freeze, MD 08/22/2020 1710

## 2020-09-11 NOTE — Discharge Planning (Signed)
RNCM consulted Farrel Gordon, RN of [facility] regarding a [facility] bed.  Bevely Palmer states someone from [place] will visit with pt in the next couple of hours to access.

## 2020-09-11 NOTE — Progress Notes (Signed)
Manufacturing engineer [facility]) Hospital Liaison note.    Received request from [place] for family interest in [facility] Chart and pt information under review by [facility] physician.  Hospice eligibility pending at this time.  Visited at bedside. Pt Aox4, states adamantly that he wishes to discharge home with paid caregivers and hospice support.  Pt's wife and daughter at bedside throughout conversation.  Outlined hospice at home support and spoke frankly about expected symptom burden and the need in the near future for round the clock meds to ensure comfort.  Discussed challenges of setting up and and managing 24 hr/day caregivers.  Pt insists he does not want to go to [facility], asks if he can be forced to go against his will.  Discussed at length; PMT NP Gregary Signs made aware.  In any case, [facility] is unable to offer a room today. Hospital Liaison will follow up tomorrow or sooner if a room becomes available. Please do not hesitate to call with questions.    Thank you for the opportunity to participate in this patient's care.  Domenic Moras, BSN, RN [facility] Liaison (listed on [place] under Hospice/[facility])    [phone] (24h on call)

## 2020-09-11 NOTE — ED Notes (Signed)
Notified Dr. Gilford Raid that pt seemed to exhibit increased work of breathing with audible wheezing after 1 L NS bolus completed. Dr. Gilford Raid assessed patient. Per Dr. Gilford Raid pt is being placed on comfort/ palliative care.

## 2020-09-11 NOTE — ED Notes (Signed)
In speaking with patient's wife,they were notified that a bed was not available at [facility] today but possibly there would be one available tomorrow. Pt's wife is wanting the patient to stay in the hospital overnight until a bed [facility] opens up. Dr. Darl Householder made aware of this.

## 2020-09-11 NOTE — ED Provider Notes (Signed)
[facility] EMERGENCY DEPARTMENT Provider Note   CSN: [id number] Arrival date & time: 08/20/2020  1950     History No chief complaint on file.   Leonard [name] is a 75 y.o. male.  Pt presents to the ED today with a fall.  Pt has a hx of multiple medical problems.  The pt was admitted from 8/13-18 for frequent falls.  Pt was also found to be hypotensive with AKI on admission.  (Cr 3.85 on admission and Cr 2.84 on d/c).  His bp and Cr improved with fluids.  Pt was put back on torsemide 20 mg daily.  Pt was seen by cardiology yesterday and Cr was back up to 3.78.  Cards recommended palliative care.  Today, pt dropped his phone.  [facility].  He called to get someone to get it and no one answered his call.  So, he leaned out of bed to get it and fell oob.  He denies any pain.          Past Medical History:  Diagnosis Date   CAD (coronary artery disease)    Chronic systolic heart failure (HCC)    CKD (chronic kidney disease), stage [name] (HCC)    Hyperlipidemia    Hypertension    ICD (implantable cardioverter-defibrillator) in place    Ventricular tachycardia ([place])     There are no problems to display for this patient.   Past Surgical History:  Procedure Laterality Date   CORONARY ANGIOPLASTY WITH STENT PLACEMENT  2017   ICD IMPLANT  2017       Family History  Problem Relation Age of Onset   Diabetes Mother    Heart disease Paternal Grandmother    Heart disease Paternal Grandfather        Home Medications Prior to Admission medications   Medication Sig Start Date End Date Taking? Authorizing Provider  acetaminophen (TYLENOL) 325 MG tablet Take 650 mg by mouth every 4 (four) hours as needed for mild pain or headache.   Yes [provider]  amiodarone (PACERONE) 100 MG tablet Take 100 mg by mouth daily.   Yes [provider]  atorvastatin (LIPITOR) 80 MG tablet Take 80 mg by mouth every evening.   Yes [provider]   Cholecalciferol (VITAMIN D3) 50 MCG (2000 UT) TABS Take 50 mcg by mouth daily.   Yes [provider]  clotrimazole (LOTRIMIN) 1 % cream Apply 1 application topically 2 (two) times daily. To groin, folds   Yes [provider]  empagliflozin (JARDIANCE) 10 MG TABS tablet Take 10 mg by mouth daily.   Yes [provider]  Ensure (ENSURE) Take 237 mLs by mouth in the morning and at bedtime.   Yes [provider]  ezetimibe (ZETIA) 10 MG tablet Take 10 mg by mouth at bedtime.   Yes [provider]  levothyroxine (SYNTHROID) 125 MCG tablet Take 125 mcg by mouth daily.   Yes [provider]  midodrine (PROAMATINE) 5 MG tablet Take 5 mg by mouth in the morning, at noon, and at bedtime.   Yes [provider]  Multiple Vitamins-Minerals (THEREMS-M) TABS Take 1 tablet by mouth daily.   Yes [provider]  ondansetron (ZOFRAN) 4 MG tablet Take 4 mg by mouth every 8 (eight) hours as needed for nausea or vomiting.   Yes [provider]  tamsulosin (FLOMAX) 0.4 MG CAPS capsule Take 0.4 mg by mouth daily.   Yes [provider]  ticagrelor (BRILINTA) 60 MG TABS tablet Take 60 mg by mouth 2 (two) times daily.   Yes [provider]  torsemide (DEMADEX) 20 MG tablet Take 20 mg by mouth daily.   Yes [provider]  venlafaxine (EFFEXOR) 75 MG tablet Take 75 mg by mouth daily.   Yes [provider]    Allergies    Clam shell, Mixed ragweed, Shellfish allergy, and Sulfa antibiotics  Review of Systems   Review of Systems  Skin:  Positive for wound.  Neurological:  Positive for weakness.  All other systems reviewed and are negative.  Physical Exam Updated Vital Signs BP (!) 78/57   Pulse (!) 45   Temp (!) 96.7 F (35.9 C) (Temporal)   Resp 18   Ht '6\' 1"'  (1.854 m)   Wt 113.4 kg   SpO2 (!) 86%   BMI 32.98 kg/m   Physical Exam Vitals and nursing note reviewed.  Constitutional:       Appearance: Normal appearance.  HENT:     Head:     Comments: Hematoma to right forehead    Right Ear: External ear normal.     Left Ear: External ear normal.     Nose: Nose normal.     Mouth/Throat:     Mouth: Mucous membranes are dry.  Eyes:     Extraocular Movements: Extraocular movements intact.     Conjunctiva/sclera: Conjunctivae normal.     Pupils: Pupils are equal, round, and reactive to light.  Cardiovascular:     Rate and Rhythm: Normal rate and regular rhythm.     Pulses: Normal pulses.     Heart sounds: Normal heart sounds.  Pulmonary:     Effort: Pulmonary effort is normal.     Breath sounds: Normal breath sounds.  Abdominal:     General: Abdomen is flat. Bowel sounds are normal.     Palpations: Abdomen is soft.     Comments: Bruising to lower abdomen  Musculoskeletal:        General: Normal range of motion.     Cervical back: Normal range of motion and neck supple.  Skin:    Capillary Refill: Capillary refill takes less than 2 seconds.     Comments: Skin tears to right hand  Neurological:     General: No focal deficit present.     Mental Status: He is alert and oriented to person, place, and time.  Psychiatric:        Mood and Affect: Mood normal.        Behavior: Behavior normal.    ED Results / Procedures / Treatments   Labs (all labs ordered are listed, but only abnormal results are displayed) Labs Reviewed  COMPREHENSIVE METABOLIC PANEL - Abnormal; Notable for the following components:      Result Value   Sodium 127 (*)    Potassium 5.4 (*)    CO2 16 (*)    BUN 88 (*)    Creatinine, Ser 4.37 (*)    Calcium 8.1 (*)    Total Protein 5.6 (*)    Albumin 3.0 (*)    Total Bilirubin 2.2 (*)    GFR, Estimated 13 (*)    All other components within normal limits  CBC - Abnormal; Notable for the following components:   WBC 11.4 (*)    Hemoglobin 12.4 (*)    MCHC 29.2 (*)    RDW 17.8 (*)    nRBC 0.3 (*)    All other components within normal  limits   LACTIC ACID, PLASMA - Abnormal; Notable for the following components:   Lactic Acid, Venous 2.5 (*)    All other components within normal limits  PROTIME-INR - Abnormal; Notable for the following components:   Prothrombin Time 16.3 (*)    INR 1.3 (*)    All other components within normal limits  I-STAT CHEM 8, ED - Abnormal; Notable for the following components:   Sodium 129 (*)    Potassium 5.2 (*)    Chloride 97 (*)    BUN 84 (*)    Creatinine, Ser 4.60 (*)    Calcium, Ion 0.53 (*)    TCO2 19 (*)    All other components within normal limits  RESP PANEL BY RT-PCR (FLU A&B, COVID) ARPGX2  ETHANOL  DIGOXIN LEVEL  LACTIC ACID, PLASMA  URINALYSIS, ROUTINE W REFLEX MICROSCOPIC  SAMPLE TO BLOOD BANK    EKG None  Radiology CT HEAD WO CONTRAST (5MM)  Result Date: 09/12/2020 CLINICAL DATA:  Fall, on blood thinners, recently discharged from hospital for multiple falls, head trauma EXAM: CT HEAD WITHOUT CONTRAST TECHNIQUE: Contiguous axial images were obtained from the base of the skull through the vertex without intravenous contrast. Sagittal and coronal MPR images reconstructed from axial data set. COMPARISON:  05/21/2019 FINDINGS: Brain: Beam hardening/motion artifacts in the frontal regions bilaterally. Generalized atrophy. Normal ventricular morphology. No midline shift or mass effect. Minimal small vessel chronic ischemic changes of deep cerebral white matter. No intracranial hemorrhage, mass lesion, or evidence of acute infarction. No extra-axial fluid collections. Vascular: Atherosclerotic calcification of internal carotid arteries bilaterally at skull base. Skull: Small RIGHT frontal scalp hematoma.  No calvarial fractures. Sinuses/Orbits: Clear Other: N/A IMPRESSION: Atrophy with minimal small vessel chronic ischemic changes of deep cerebral white matter. No acute intracranial abnormalities. Small RIGHT frontal scalp hematoma. Electronically Signed   By: Lavonia Dana M.D.   On:  09/04/2020 09:31   CT Cervical Spine Wo Contrast  Result Date: 09/01/2020 CLINICAL DATA:  Fall, on blood thinners EXAM: CT CERVICAL SPINE WITHOUT CONTRAST TECHNIQUE: Multidetector CT imaging of the cervical spine was performed without intravenous contrast. Multiplanar CT image reconstructions were also generated. Examination degraded by motion artifacts. COMPARISON:  None FINDINGS: Alignment: Minimal retrolisthesis at C5-C6. Remaining alignments normal. Skull base and vertebrae: Osseous mineralization normal. Skull base intact. Vertebral body heights maintained. Multilevel degenerative disc and facet disease changes of cervical spine. No fracture, subluxation, or bone destruction. Significant narrowing of LEFT C5-C6 neural foramen by retrolisthesis, uncovertebral hypertrophy and facet degenerative changes. Soft tissues and spinal canal: Prevertebral soft tissues normal thickness. Extensive atherosclerotic calcifications at the carotid bifurcations. Disc levels:  No specific abnormalities. Upper chest: LEFT pleural effusion.  Lung apices otherwise clear. Other: N/A IMPRESSION: Significant degenerative disc and facet disease changes of the cervical spine as above. No acute cervical spine abnormalities. LEFT pleural effusion. Significant atherosclerotic calcifications at the carotid bifurcations. Electronically Signed   By: Lavonia Dana M.D.   On: 09/07/2020 09:35   DG Pelvis Portable  Result Date: 08/20/2020 CLINICAL DATA:  Fall, on blood thinners EXAM: PORTABLE PELVIS 1-2 VIEWS COMPARISON:  Portable exam 0847 hours without priors for comparison FINDINGS: Mild osseous demineralization. Hip and SI joint spaces preserved. No fracture, dislocation, or bone destruction. Degenerative disc and facet disease changes at visualized lower lumbar spine. IMPRESSION: No acute pelvic abnormalities. Electronically Signed   By: Lavonia Dana M.D.   On: 09/07/2020 09:24   DG Chest Portable 1 View  Result Date:  08/20/2020 CLINICAL DATA:  Fall, on blood thinners EXAM: PORTABLE CHEST 1 VIEW COMPARISON:  Portable exam 5284 hours compared to 08/31/2020 FINDINGS: LEFT subclavian ICD with lead projecting over RIGHT ventricle. Enlargement of cardiac silhouette with slight pulmonary vascular congestion. Mediastinal contours normal with atherosclerotic calcification at aortic arch. LEFT pleural effusion and basilar atelectasis increased from previous exam. Remaining lungs clear. No pneumothorax or definite fractures. Scattered endplate spur formation thoracic spine. IMPRESSION: Enlargement of cardiac silhouette with pulmonary vascular congestion post ICD. Increased LEFT pleural effusion and basilar atelectasis. Aortic Atherosclerosis (ICD10-I70.0). Electronically Signed   By: Lavonia Dana M.D.   On: 09/12/2020 09:23   DG Hand Complete Right  Result Date: 09/10/2020 CLINICAL DATA:  Fall, on blood thinners, tenderness everywhere, abrasion posterior RIGHT hand EXAM: RIGHT HAND - COMPLETE 3+ VIEW COMPARISON:  None FINDINGS: Osseous mineralization grossly normal. Degenerative changes at first [place] joint, second and third MCP joints, and radiocarpal joint. Soft tissue swelling overlying dorsum of hand at the level of the MCP joints. No acute fracture, dislocation, or bone destruction. IMPRESSION: Scattered degenerative changes as above. No acute osseous abnormalities. Electronically Signed   By: Lavonia Dana M.D.   On: 08/19/2020 09:22   CT CHEST ABDOMEN PELVIS WO CONTRAST  Result Date: 08/20/2020 CLINICAL DATA:  Abdominal trauma. Recently discharged from hospital. Patient fell at nursing home and has bilateral lower quadrant bruises as well as bruising to arms and legs. EXAM: CT CHEST, ABDOMEN AND PELVIS WITHOUT CONTRAST TECHNIQUE: Multidetector CT imaging of the chest, abdomen and pelvis was performed following the standard protocol without IV contrast. COMPARISON:  None. FINDINGS: Of note, patient respiratory motion artifact  degrades image quality. CT CHEST FINDINGS Cardiovascular: Cardiomegaly, stable compared to chest radiograph 12/17/2018. Left chest wall pacemaker with single lead within the right ventricle. Multivessel severe coronary artery atherosclerosis. Mild calcifications of the aortic arch. Mediastinum/Nodes: No enlarged mediastinal, hilar, or axillary lymph nodes. Thyroid gland, trachea, and esophagus demonstrate no significant findings. Lungs/Pleura: Moderate layering left pleural effusion with compressive subsegmental atelectasis in the left lower lobe. Subsegmental dependent atelectasis in the left upper lobe and right lower lobe. Punctate calcified granuloma in the right lower lobe (series 4, image 79). Musculoskeletal: Mild dextroscoliosis of the thoracic spine with flowing anterior osteophytes, consistent with diffuse idiopathic skeletal hyperostosis. Multilevel mild degenerative disc disease throughout the mid and distal thoracic spine. No fracture. CT ABDOMEN PELVIS FINDINGS Hepatobiliary: No focal liver abnormality is seen. Layering mildly hyperdense material in the gallbladder body, likely gallbladder sludge. The gallbladder is not distended. No gallbladder wall thickening or biliary dilatation. Pancreas: Diffuse moderate parenchymal atrophy and fatty infiltration. No dilatation of the main pancreatic duct. No adjacent inflammatory changes. Spleen: Normal in size without focal abnormality. Adrenals/Urinary Tract: Adrenal glands are unremarkable. Mild atrophy of the kidneys. No renal calculi, focal lesion, or hydronephrosis. Bladder is unremarkable. Stomach/Bowel: Stomach is within normal limits. Appendix appears normal. Incidentally noted 2.5 cm lipoma in the transverse portion of the duodenum (series 5, image 43; series 3, image 81). No evidence of bowel wall thickening, distention, or inflammatory changes. Vascular/Lymphatic: Severe calcific atherosclerosis is noted at the origin of the SMA (series 3, image 67;  series 5, image 50). Severe calcific atherosclerosis at the origin of the left renal artery (series 3, image 71). 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 Scattered mild-to-moderate calcific atherosclerosis of the bilateral common femoral arteries. No abdominal aortic aneurysm. No enlarged abdominal or pelvic lymph nodes. Reproductive: Prostate  is unremarkable. Other: Trace simple ascites is noted within the hepatorenal recess. Fat stranding is noted within the lateral abdominal wall bilaterally, left-greater-than-right. No hematoma of the abdominal wall musculature. No subcutaneous fluid collection. Musculoskeletal: Levoscoliosis of the lumbar spine centered at the L3 vertebral body. Multilevel moderate-to-severe degenerative disc disease at the L2 through S1 levels with prominent right marginal osteophytes. Advanced degenerative changes of the sacroiliac joints with vacuum phenomenon. No fracture. A 1.7 x 1.4 cm lytic lesion in right pubic body has coarse trabecula (series 3, image 121, series 5, image 43). IMPRESSION: CT Chest: 1. Moderate layering left pleural effusion with subsegmental compressive left lower lobe atelectasis. 2. Stable cardiomegaly. 3. No fracture. CT Abdomen/Pelvis: 1. No fracture. No findings to suggest intra-abdominal trauma on this unenhanced exam. 2. Fat stranding within the lateral abdominal wall bilaterally, left greater than right, consistent with clinical history of lower quadrant bruising. No subcutaneous fluid collection or muscular hematoma in the visualized abdominal wall. 3. A 1.7 cm lytic lesion in the right pubic body with coarse trabecula is favored to represent a hemangioma. However multiple myeloma is also in the differential. Consider MRI on a non-emergent basis for further characterization. 4. Aortic atherosclerosis with severe calcific atherosclerosis at the origins of the SMA and left renal artery. Aortic Atherosclerosis (ICD10-I70.0).  Electronically Signed   By: Ileana Roup M.D.   On: 09/15/2020 10:13    Procedures Procedures   Medications Ordered in ED Medications  sodium chloride 0.9 % bolus 1,000 mL (0 mLs Intravenous Stopped 08/24/2020 1021)    And  0.9 %  sodium chloride infusion (has no administration in time range)  LORazepam (ATIVAN) tablet 1 mg (has no administration in time range)    Or  LORazepam (ATIVAN) 2 MG/ML concentrated solution 1 mg (has no administration in time range)    Or  LORazepam (ATIVAN) injection 1 mg (has no administration in time range)  morphine 2 MG/ML injection 1 mg (has no administration in time range)  acetaminophen (TYLENOL) tablet 650 mg (has no administration in time range)    Or  acetaminophen (TYLENOL) suppository 650 mg (has no administration in time range)    ED Course  I have reviewed the triage vital signs and the nursing notes.  Pertinent labs & imaging results that were available during my care of the patient were reviewed by me and considered in my medical decision making (see chart for details).    MDM Rules/Calculators/A&P                           Pt's bp is low chronically, so a level 1 trauma was not activated.  Pt does have some pleural effusions.  The pt does have worsening renal function.  Pt's bp staying in the low 80s with fluid.  Pt's wife knows pt is very sick.  She said cards did talk about palliative care, but she does not know what that means.  We talked about it and she knows his disease is at end stage.  We will make DNR/DNI.  She requests cards input.  She wants him as comfortable as possible.  Cards did see pt and requested a stat palliative care consult.  ICD will be turned off.  Brilinta will be stopped.  SW/CM have been consulted for [facility].  Someone from [facility] will come see pt.   CRITICAL CARE Performed by: Isla Pence   Total critical care time: 45 minutes  Critical care  time was exclusive of separately billable  procedures and treating other patients.  Critical care was necessary to treat or prevent imminent or life-threatening deterioration.  Critical care was time spent personally by me on the following activities: development of treatment plan with patient and/or surrogate as well as nursing, discussions with consultants, evaluation of patient's response to treatment, examination of patient, obtaining history from patient or surrogate, ordering and performing treatments and interventions, ordering and review of laboratory studies, ordering and review of radiographic studies, pulse oximetry and re-evaluation of patient's condition.   Final Clinical Impression(s) / ED Diagnoses Final diagnoses:  Trauma  Multiple falls  Hyponatremia  AKI (acute kidney injury) ([place])  Skin tear of right hand without complication, initial encounter  Acute on chronic congestive heart failure, unspecified heart failure type ([place])  Cardiogenic shock ([place])    Rx / DC Orders ED Discharge Orders     None        Isla Pence, MD 09/03/2020 1506

## 2020-09-11 NOTE — ED Notes (Signed)
..  Trauma Response Nurse Note-  Reason for Call / Reason for Trauma activation:  Level 2 fall on blood thiners  Initial Focused Assessment (If applicable, or please see trauma documentation):  Pt arrived via [facility] from [employer] with hx of multiple falls. States he slipped out of bed this am. Has old bruising to forehead, bilateral flank area. Was recently in hospital- discharged on 8/18.  Pt has extensive hx of hypotension, also has a pacemaker.   Interventions:  Xrays CT scans Labs  Plan of Care as of this note:  Rolene Arbour, RN BSN Trauma Response Nurse [phone]

## 2020-09-11 NOTE — ED Notes (Signed)
Patient does have extensive hx of hypotension - taking midodrine as part of his daily medicine regime. No level one activation at this time per Dr. Gilford Raid.

## 2020-09-11 NOTE — H&P (Signed)
  History and Physical  Leonard [name] MRN:[id number] DOB: 09/19/1945 DOA: 09/09/2020  PCP: Henson, Vickie L, NP-C Patient coming from: Skilled nursing facility  I have personally briefly reviewed patient's old medical records in [facility]   Chief Complaint: SOb and weakn, mechanical fall  HPI: Leonard [name] is a 75 y.o. male past medical history of combined systolic and diastolic heart failure, with ischemic cardiomyopathy with an EF of 20% grade 2 diastolic heart failure, is also history of a posterior STEMI with 2 overlapping DES stent to the RCA last cath in 2017, with a single-chamber ICD placed in 2017 for VT morbid obesity recently admitted on last month for acute kidney injury after fluid resuscitation and his creatinine returning to baseline he was was discharged on diuretic therapy followed up with the advanced heart failure clinic today and was found to be in worsening new acute kidney injury, bilateral pleural effusion hypotensive.  Cardiology spoke to him about palliative care and comfort measures and what does not mean in the setting of advanced heart failure with worsening renal function.  And she agreed to move towards comfort care he was made a DNR/DNI. He was sent to the ED but unfortunately we cannot place him at a residential hospice facility so we will admit him to the hospital.   Review of Systems: All systems reviewed and apart from history of presenting illness, are negative.  Past Medical History:  Diagnosis Date   CAD (coronary artery disease)    Chronic systolic heart failure (HCC)    CKD (chronic kidney disease), stage III (HCC)    Hyperlipidemia    Hypertension    ICD (implantable cardioverter-defibrillator) in place    Ventricular tachycardia (HCC)    Past Surgical History:  Procedure Laterality Date   CORONARY ANGIOPLASTY WITH STENT PLACEMENT  2017   ICD IMPLANT  2017   Social History:  has no history on file for tobacco use, alcohol  use, and drug use.   Allergies  Allergen Reactions   Clam Shell    Mixed Ragweed    Shellfish Allergy    Sulfa Antibiotics     Family History  Problem Relation Age of Onset   Diabetes Mother    Heart disease Paternal Grandmother    Heart disease Paternal Grandfather     Prior to Admission medications   Medication Sig Start Date End Date Taking? Authorizing Provider  acetaminophen (TYLENOL) 325 MG tablet Take 650 mg by mouth every 4 (four) hours as needed for mild pain or headache.   Yes [provider]  amiodarone (PACERONE) 100 MG tablet Take 100 mg by mouth daily.   Yes [provider]  atorvastatin (LIPITOR) 80 MG tablet Take 80 mg by mouth every evening.   Yes [provider]  Cholecalciferol (VITAMIN D3) 50 MCG (2000 UT) TABS Take 50 mcg by mouth daily.   Yes [provider]  clotrimazole (LOTRIMIN) 1 % cream Apply 1 application topically 2 (two) times daily. To groin, folds   Yes [provider]  empagliflozin (JARDIANCE) 10 MG TABS tablet Take 10 mg by mouth daily.   Yes [provider]  Ensure (ENSURE) Take 237 mLs by mouth in the morning and at bedtime.   Yes [provider]  ezetimibe (ZETIA) 10 MG tablet Take 10 mg by mouth at bedtime.   Yes [provider]  levothyroxine (SYNTHROID) 125 MCG tablet Take 125 mcg by mouth daily.   Yes [provider]    midodrine (PROAMATINE) 5 MG tablet Take 5 mg by mouth in the morning, at noon, and at bedtime.   Yes [provider]  Multiple Vitamins-Minerals (THEREMS-M) TABS Take 1 tablet by mouth daily.   Yes [provider]  ondansetron (ZOFRAN) 4 MG tablet Take 4 mg by mouth every 8 (eight) hours as needed for nausea or vomiting.   Yes [provider]  tamsulosin (FLOMAX) 0.4 MG CAPS capsule Take 0.4 mg by mouth daily.   Yes [provider]  ticagrelor (BRILINTA) 60 MG TABS tablet Take 60 mg by mouth 2 (two) times daily.    Yes [provider]  torsemide (DEMADEX) 20 MG tablet Take 20 mg by mouth daily.   Yes [provider]  venlafaxine (EFFEXOR) 75 MG tablet Take 75 mg by mouth daily.   Yes [provider]   Physical Exam: Vitals:   09/10/2020 1035 09/04/2020 1050 08/19/2020 1221 08/19/2020 1223  BP: (!) 87/58   (!) 78/57  Pulse: 85 (!) 53 (!) 59 (!) 45  Resp: (!) 22 19  18  Temp:      TempSrc:      SpO2: 92% 92% 90% (!) 86%  Weight:      Height:        General exam: Moderately built and nourished patient, lying comfortably supine on the gurney in no obvious distress. Head, eyes and ENT: Nontraumatic and normocephalic.  Neck: Supple.  Positive JVD third spacing Lymphatics: No lymphadenopathy. Respiratory system: Clear to auscultation. No increased work of breathing. Cardiovascular system: S1 and S2 heard, RRR. No JVD, murmurs, gallops, clicks or pedal edema. Gastrointestinal system: Abdomen is nondistended, soft and nontender. Normal bowel sounds heard. No organomegaly or masses appreciated. Central nervous system: Alert and oriented. No focal neurological deficits. Extremities: 4+ edema Skin: No rashes or acute findings. Musculoskeletal system: Negative exam. Psychiatry: Pleasant and cooperative.   Labs on Admission:  Basic Metabolic Panel: Recent Labs  Lab 08/25/2020 0855 09/10/2020 0910  NA 129* 127*  K 5.2* 5.4*  CL 97* 100  CO2  --  16*  GLUCOSE 89 94  BUN 84* 88*  CREATININE 4.60* 4.37*  CALCIUM  --  8.1*   Liver Function Tests: Recent Labs  Lab 08/27/2020 0910  AST 38  ALT 40  ALKPHOS 114  BILITOT 2.2*  PROT 5.6*  ALBUMIN 3.0*   No results for input(s): LIPASE, AMYLASE in the last 168 hours. No results for input(s): AMMONIA in the last 168 hours. CBC: Recent Labs  Lab 09/10/2020 0855 09/17/2020 0910  WBC  --  11.4*  HGB 13.9 12.4*  HCT 41.0 42.5  MCV  --  91.0  PLT  --  240   Cardiac Enzymes: No results for input(s): CKTOTAL, CKMB, CKMBINDEX,  TROPONINI in the last 168 hours.  BNP (last 3 results) No results for input(s): PROBNP in the last 8760 hours. CBG: No results for input(s): GLUCAP in the last 168 hours.  Radiological Exams on Admission: CT HEAD WO CONTRAST (5MM)  Result Date: 08/27/2020 CLINICAL DATA:  Fall, on blood thinners, recently discharged from hospital for multiple falls, head trauma EXAM: CT HEAD WITHOUT CONTRAST TECHNIQUE: Contiguous axial images were obtained from the base of the skull through the vertex without intravenous contrast. Sagittal and coronal MPR images reconstructed from axial data set. COMPARISON:  05/21/2019 FINDINGS: Brain: Beam hardening/motion artifacts in the frontal regions bilaterally. Generalized atrophy. Normal ventricular morphology. No midline shift or mass effect. Minimal small vessel chronic ischemic changes   of deep cerebral white matter. No intracranial hemorrhage, mass lesion, or evidence of acute infarction. No extra-axial fluid collections. Vascular: Atherosclerotic calcification of internal carotid arteries bilaterally at skull base. Skull: Small RIGHT frontal scalp hematoma.  No calvarial fractures. Sinuses/Orbits: Clear Other: N/A IMPRESSION: Atrophy with minimal small vessel chronic ischemic changes of deep cerebral white matter. No acute intracranial abnormalities. Small RIGHT frontal scalp hematoma. Electronically Signed   By: Mark  Boles M.D.   On: 09/08/2020 09:31   CT Cervical Spine Wo Contrast  Result Date:  CLINICAL DATA:  Fall, on blood thinners EXAM: CT CERVICAL SPINE WITHOUT CONTRAST TECHNIQUE: Multidetector CT imaging of the cervical spine was performed without intravenous contrast. Multiplanar CT image reconstructions were also generated. Examination degraded by motion artifacts. COMPARISON:  None FINDINGS: Alignment: Minimal retrolisthesis at C5-C6. Remaining alignments normal. Skull base and vertebrae: Osseous mineralization normal. Skull base intact. Vertebral  body heights maintained. Multilevel degenerative disc and facet disease changes of cervical spine. No fracture, subluxation, or bone destruction. Significant narrowing of LEFT C5-C6 neural foramen by retrolisthesis, uncovertebral hypertrophy and facet degenerative changes. Soft tissues and spinal canal: Prevertebral soft tissues normal thickness. Extensive atherosclerotic calcifications at the carotid bifurcations. Disc levels:  No specific abnormalities. Upper chest: LEFT pleural effusion.  Lung apices otherwise clear. Other: N/A IMPRESSION: Significant degenerative disc and facet disease changes of the cervical spine as above. No acute cervical spine abnormalities. LEFT pleural effusion. Significant atherosclerotic calcifications at the carotid bifurcations. Electronically Signed   By: Mark  Boles M.D.   On: 08/25/2020 09:35   DG Pelvis Portable  Result Date: 08/22/2020 CLINICAL DATA:  Fall, on blood thinners EXAM: PORTABLE PELVIS 1-2 VIEWS COMPARISON:  Portable exam 0847 hours without priors for comparison FINDINGS: Mild osseous demineralization. Hip and SI joint spaces preserved. No fracture, dislocation, or bone destruction. Degenerative disc and facet disease changes at visualized lower lumbar spine. IMPRESSION: No acute pelvic abnormalities. Electronically Signed   By: Mark  Boles M.D.   On: 09/10/2020 09:24   DG Chest Portable 1 View  Result Date: 08/21/2020 CLINICAL DATA:  Fall, on blood thinners EXAM: PORTABLE CHEST 1 VIEW COMPARISON:  Portable exam 0843 hours compared to 08/31/2020 FINDINGS: LEFT subclavian ICD with lead projecting over RIGHT ventricle. Enlargement of cardiac silhouette with slight pulmonary vascular congestion. Mediastinal contours normal with atherosclerotic calcification at aortic arch. LEFT pleural effusion and basilar atelectasis increased from previous exam. Remaining lungs clear. No pneumothorax or definite fractures. Scattered endplate spur formation thoracic spine.  IMPRESSION: Enlargement of cardiac silhouette with pulmonary vascular congestion post ICD. Increased LEFT pleural effusion and basilar atelectasis. Aortic Atherosclerosis (ICD10-I70.0). Electronically Signed   By: Mark  Boles M.D.   On: 09/06/2020 09:23   DG Hand Complete Right  Result Date: 09/09/2020 CLINICAL DATA:  Fall, on blood thinners, tenderness everywhere, abrasion posterior RIGHT hand EXAM: RIGHT HAND - COMPLETE 3+ VIEW COMPARISON:  None FINDINGS: Osseous mineralization grossly normal. Degenerative changes at first CMC joint, second and third MCP joints, and radiocarpal joint. Soft tissue swelling overlying dorsum of hand at the level of the MCP joints. No acute fracture, dislocation, or bone destruction. IMPRESSION: Scattered degenerative changes as above. No acute osseous abnormalities. Electronically Signed   By: Mark  Boles M.D.   On: 08/23/2020 09:22   CT CHEST ABDOMEN PELVIS WO CONTRAST  Result Date: 08/26/2020 CLINICAL DATA:  Abdominal trauma. Recently discharged from hospital. Patient fell at nursing home and has bilateral lower quadrant bruises as well as bruising to arms and legs.   EXAM: CT CHEST, ABDOMEN AND PELVIS WITHOUT CONTRAST TECHNIQUE: Multidetector CT imaging of the chest, abdomen and pelvis was performed following the standard protocol without IV contrast. COMPARISON:  None. FINDINGS: Of note, patient respiratory motion artifact degrades image quality. CT CHEST FINDINGS Cardiovascular: Cardiomegaly, stable compared to chest radiograph 12/17/2018. Left chest wall pacemaker with single lead within the right ventricle. Multivessel severe coronary artery atherosclerosis. Mild calcifications of the aortic arch. Mediastinum/Nodes: No enlarged mediastinal, hilar, or axillary lymph nodes. Thyroid gland, trachea, and esophagus demonstrate no significant findings. Lungs/Pleura: Moderate layering left pleural effusion with compressive subsegmental atelectasis in the left lower lobe.  Subsegmental dependent atelectasis in the left upper lobe and right lower lobe. Punctate calcified granuloma in the right lower lobe (series 4, image 79). Musculoskeletal: Mild dextroscoliosis of the thoracic spine with flowing anterior osteophytes, consistent with diffuse idiopathic skeletal hyperostosis. Multilevel mild degenerative disc disease throughout the mid and distal thoracic spine. No fracture. CT ABDOMEN PELVIS FINDINGS Hepatobiliary: No focal liver abnormality is seen. Layering mildly hyperdense material in the gallbladder body, likely gallbladder sludge. The gallbladder is not distended. No gallbladder wall thickening or biliary dilatation. Pancreas: Diffuse moderate parenchymal atrophy and fatty infiltration. No dilatation of the main pancreatic duct. No adjacent inflammatory changes. Spleen: Normal in size without focal abnormality. Adrenals/Urinary Tract: Adrenal glands are unremarkable. Mild atrophy of the kidneys. No renal calculi, focal lesion, or hydronephrosis. Bladder is unremarkable. Stomach/Bowel: Stomach is within normal limits. Appendix appears normal. Incidentally noted 2.5 cm lipoma in the transverse portion of the duodenum (series 5, image 43; series 3, image 81). No evidence of bowel wall thickening, distention, or inflammatory changes. Vascular/Lymphatic: Severe calcific atherosclerosis is noted at the origin of the SMA (series 3, image 67; series 5, image 50). Severe calcific atherosclerosis at the origin of the left renal artery (series 3, image 71). 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 Scattered mild-to-moderate calcific atherosclerosis of the bilateral common femoral arteries. No abdominal aortic aneurysm. No enlarged abdominal or pelvic lymph nodes. Reproductive: Prostate is unremarkable. Other: Trace simple ascites is noted within the hepatorenal recess. Fat stranding is noted within the lateral abdominal wall bilaterally,  left-greater-than-right. No hematoma of the abdominal wall musculature. No subcutaneous fluid collection. Musculoskeletal: Levoscoliosis of the lumbar spine centered at the L3 vertebral body. Multilevel moderate-to-severe degenerative disc disease at the L2 through S1 levels with prominent right marginal osteophytes. Advanced degenerative changes of the sacroiliac joints with vacuum phenomenon. No fracture. A 1.7 x 1.4 cm lytic lesion in right pubic body has coarse trabecula (series 3, image 121, series 5, image 43). IMPRESSION: CT Chest: 1. Moderate layering left pleural effusion with subsegmental compressive left lower lobe atelectasis. 2. Stable cardiomegaly. 3. No fracture. CT Abdomen/Pelvis: 1. No fracture. No findings to suggest intra-abdominal trauma on this unenhanced exam. 2. Fat stranding within the lateral abdominal wall bilaterally, left greater than right, consistent with clinical history of lower quadrant bruising. No subcutaneous fluid collection or muscular hematoma in the visualized abdominal wall. 3. A 1.7 cm lytic lesion in the right pubic body with coarse trabecula is favored to represent a hemangioma. However multiple myeloma is also in the differential. Consider MRI on a non-emergent basis for further characterization. 4. Aortic atherosclerosis with severe calcific atherosclerosis at the origins of the SMA and left renal artery. Aortic Atherosclerosis (ICD10-I70.0). Electronically Signed   By: Ileana Roup M.D.   On: 09/06/2020 10:13    EKG: Independently reviewed.  None  Assessment/Plan Acute combined systolic and diastolic heart failure with biventricular failure/profound cardiogenic shock: He saw the advanced heart failure clinic today and he was deemed not a candidate for advanced therapy and was found in shock with a bicarb of 16 lactic acid of 2.5 and worsening renal function afford. The case was discussed with the wife and the patient and due to his end-stage heart failure it was  recommended to transition to comfort/hospice care. The wife is very sick and cannot take care of him at home but they both have agreed to proceed with residential hospice with full comfort measures. I have talked to the wife and daughter and they have confirmed they would like to proceed with full comfort measures we will try to transition him to a residential hospice facility. He is currently markedly fluid overloaded on exam drop from a respiratory status he is stable we will hold the IV Lasix due to his worsening renal function and hypotension. Need to deactivate AICD. We will get a social worker involved to try to transition him to a residential hospice facility.  Acute kidney injury on chronic kidney stage III: With a baseline creatinine around 2 on admission for cardiorenal syndrome transition to comfort care.  CAD: Will discontinue All medications that are not related to comfort the family has agreed. He denies any chest pain.  History of DTs: Will deactivate AICD.  Hyperkalemia: With potassium of 5.4 no treatment plan as well moving towards comfort care.  Hypovolemic hyponatremia: Noted.  Scalp hematoma: Likely due to mechanical fall CT scan of the head negative for intracranial bleed stopping Brilinta.   DVT Prophylaxis: none Code Status: dnr/dni  Family Communication: Daughter and wife Disposition Plan: Inpatient admission to plan to send to residential hospice     It is my clinical opinion that admission to INPATIENT is reasonable and necessary in this 75 y.o. male presenting with symptoms of multiple comorbidities including but not limited to acute decompensated diastolic and systolic heart failure with acute kidney injury moving towards comfort care using IV morphine for comfort.  Given the aforementioned, the predictability of an adverse outcome is felt to be significant. I expect that the patient will require at least 2 midnights in the hospital to treat this  condition.   Feliz Ortiz MD [facility]   08/23/2020, 5:42 PM     

## 2020-09-11 NOTE — ED Triage Notes (Addendum)
Pt BIB [facility] from [facility] after having a fall. Pt stated he was bending oob to pick up his phone when he fell over completely and hit his head. Pt is on Brillenta. Pt Aox4. No LOC. Pt has multiple skin tears on bilateral arms, bruising to top of head, also bruising noted to lower abd. Pt has suffered from a previous fall a few days ago that has left him with bruising as well. Pt's VS  84/60 HR 64 SpO2 92% CBG-118. Pt chronically suffers from hypotension.

## 2020-09-11 NOTE — Consult Note (Signed)
Consultation Note Date: 08/29/2020   Patient Name: Leonard [name]  DOB: 07-12-1945  MRN: [id number]  Age / Sex: 75 y.o., male  PCP: Girtha Rm, NP-C Referring Physician: Aileen Fass, Tammi Klippel, MD  Reason for Consultation: symptom management, "severe end stage CHF with profound shock, needs transition to hospice at [facility] is possible"  HPI/Patient Profile: 75 y.o. male  with past medical history of combined systolic and diastolic CHF due to ICM, grade 2 diastolic dysfunction, stage [name] chronic kidney disease, CAD, STEMI with DES x 2 in Apr 03, 2015, ICD implanted in 04-03-15 for v-tach, HTN, HLD, and morbid obesity. He presented to [facility] emergency department on 09/05/2020 after having a fall at his SNF.  In the ED, he was found to be in profound cardiogenic shock. Labs significant for CO2 16, lactic acid 2.5, and creatinine greater than 4. Per heart failure team, he is not a candidate for advanced therapies.  Palliative Medicine was asked to assist with symptom management and evaluation for hospice services.  Clinical Assessment and Goals of Care: I have reviewed medical records including EPIC notes, labs and imaging, and examined the patient at bedside in the ED. He is alert and oriented. He reports mild shortness of breath. He is on oxygen for symptom relief of dyspnea.   I met in the ED consult room with wife Leonard [name] and daughter Leonard [name]  to discuss diagnosis, prognosis, GOC, EOL wishes, disposition, and options. I introduced Palliative Medicine as specialized medical care for people living with serious illness. It focuses on providing relief from the symptoms and stress of a serious illness.   We discussed a brief life review of the patient. He is from the [place] area. He had a long career as an Forensic psychologist. His first wife passed away from cancer in 2012/04/02. They had 2 daughters and 1 son. Sadly his younger daughter  became quadriplegic at age 43 after diving into a swimming pool and passed away several years ago at the age of 57. He and Leonard [name] married in Apr 03, 2014. She shares that he promised her "5 good years" after they got married.   As far as functional and nutritional status, Leonard [name] reports that he has declined over the past year and especially over the past few weeks.   We discussed his current illness and what it means in the larger context of his ongoing co-morbidities.  Natural disease trajectory of advanced heart failure was discussed.  As family previously discussed with Dr. Haroldine Laws, I confirmed that the goal of care is comfort. Reviewed what comfort care means--keeping him clean and dry, no labs, no artificial hydration or feeding, no antibiotics, minimizing of medications, and medication to relieve pain and dyspnea at EOL.   Reviewed hospice philosophy and provided information on the difference between home and residential services. Patient wants to go home, however family states they cannot care for him at home. Patient is also likely to need aggressive symptom management as his condition progresses over the next few days which is difficult to provide in  the home setting. Family agrees patient's care needs would be better managed in a hospice facility. [name] does not have a bed today.   Per hospice liaison, in the meantime patient has adamantly refused to go to [facility] at this time. He is still lucid enough currently to make this decision for himself.   Primary decision maker: Patient, but with support from family    SUMMARY OF RECOMMENDATIONS   Full comfort measures Recommend hospital admission for symptom management and to determine disposition Morphine infusion  PRN medications are available for additional symptom management at EOL PMT will continue to follow and support   Symptom Management:  Lorazepam (ATIVAN) prn for anxiety Haloperidol (HALDOL) prn for agitation   Glycopyrrolate (ROBINUL) for excessive secretions Ondansetron (ZOFRAN) prn for nausea Polyvinyl alcohol (LIQUIFILM TEARS) prn for dry eyes Antiseptic oral rinse (BIOTENE) prn for dry mouth   Code Status/Advance Care Planning: DNR  Psycho-social/Spiritual:  Created space and opportunity for patient and family to express thoughts and feelings regarding patient's current medical situation.  Emotional support provided   Prognosis:  < 2 weeks  Discharge Planning: To Be Determined      Primary Diagnoses: Present on Admission: **None**   I have reviewed the medical record, interviewed the patient and family, and examined the patient. The following aspects are pertinent.  Past Medical History:  Diagnosis Date   CAD (coronary artery disease)    Chronic systolic heart failure (HCC)    CKD (chronic kidney disease), stage [name] (HCC)    Hyperlipidemia    Hypertension    ICD (implantable cardioverter-defibrillator) in place    Ventricular tachycardia ([place])     Family History  Problem Relation Age of Onset   Diabetes Mother    Heart disease Paternal Grandmother    Heart disease Paternal Grandfather    Scheduled Meds:  sodium chloride flush  3 mL Intravenous Q12H   Continuous Infusions:  sodium chloride     morphine 2 mg/hr (08/21/2020 2006)   PRN Meds:.   Allergies  Allergen Reactions   Clam Shell    Mixed Ragweed    Shellfish Allergy    Sulfa Antibiotics    Review of Systems  Constitutional:  Positive for fatigue.  Respiratory:  Positive for shortness of breath.    Physical Exam Vitals reviewed.  Constitutional:      General: He is not in acute distress.    Appearance: He is obese. He is ill-appearing.  Pulmonary:     Effort: Pulmonary effort is normal. Tachypnea present.  Neurological:     Mental Status: He is alert and oriented to person, place, and time.     Palliative Assessment/Data: PPS 20%     Time In: 1500 Time Out: 1615 Time Total: 75  minutes Greater than 50%  of this time was spent counseling and coordinating care related to the above assessment and plan.  Signed by: Lavena Bullion, NP   Please contact Palliative Medicine Team phone at [phone] for questions and concerns.  For individual provider: See Shea Evans

## 2020-09-11 NOTE — Consult Note (Addendum)
Advanced Heart Failure Team Consult Note   Primary Physician: Girtha Rm, NP-C PCP-Cardiologist:  Dr. Haroldine Laws   Reason for Consultation: Acute on chronic biventricular systolic heart failure with profound cardiogenic shock  HPI:    Leonard [name] is seen today for evaluation of acute on chronic biventricular systolic heart failure profound cardiogenic shock at the request of Dr Gilford Raid, Emergency Medicine.   Leonard [name] is a 75 y.o. 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    Admitted 06/09/16 with DOE and orthopnea. Not on diuretics PTA. Lost his daughter, and had been grieving and drinking more and eating worse. Diuresed 7 lbs. Cath with elevated filling pressures, low cardiac output (? In setting of fluid overload, and patent stents with otherwise non-obstructive CAD. Pt enrolled in [place] study. Started on lasix.    Seen by Dr. Caryl Comes 07/01/16 and transitioned to [facility].    Last seen in AHF by Dr. Haroldine Laws 06/2016.   Admitted 11/2018 for A/C CHF requiring milrinone due to low CO-OX and to assist with diuresis. Echo with EF 15-20%. He had LBBB and EP to consider CRT-D. Dig and Entresto held due to worsening kidney function. He was discharged to CIR.   Admitted 05/2019 for fall after consuming ETOH. BPs initially in 70s and SCr elevated. Ethanol 170 in ED, diuretics held and gently re-hydrated with IVF. Cardiology consulted, lasix restarted and spiro decreased to 12.5.    He has followed with Dr. Ellyn Hack with general cardiology.    Admitted 8/22 with hypotension and bradycardia, found after falling. SCr 3.85 and dig level 2.9 on admit, renal U/S negative for obstruction. BP improved with IVF. Cardiology consulted to assist with  management. Echo showed EF <20% RV moderately reduced, no further plans for ischemia evaluation with acute renal insufficiency. GDMT held due to AKI and hypotension. Midodrine started and torsemide 20 added at discharge. SCr down to 2.7 on discharge. Hospitalization c/b acute urinary retention. He was discharged to SNF, weight 234 lbs.   Seen in [facility] yesterday for post hospital follow up with his wife. Had been working with PT but still very weak. Also noted sacral wound that was painful. He was noted to be markedly fluid overloaded on exam and by optivol. Thoracic impedence was down suggesting increased fluid levels, no VT. His wt was also up 17 lb since his discharge. Plan was to increase diuretics w/ close clinic f/u in 1 week. Labs resulted after pt visit showing worsening renal function w/ SCr up to 3.78 (previously 2.84), BUN 70, Na 126, K 5.1 and low CO2 at 18. BNP 2,856.  Patient now reports to the ED after falling out of bed at skilled nursing facility.  He denies loss of consciousness.  He reports that he was reaching over to a bedside table for a book and fell out of bed.    In the ED, labs are consistent with profound shock.  CO2 16.  Lactic acid 2.5.  White count 11.4.  Afebrile.  Hemoglobin 12.4, sodium 127, potassium 5.4, creatinine 4.60.  BUN 88. Also hypotensive with systolic blood pressures in the 80s.  Diastolics in the 16X.  Markedly fluid overloaded with 3+ edema up to thighs but is comfortable at rest without dyspnea.  O2 sats stable on  room air. He has been sleeping a lot per wife report but able to arouse and conversant. Mentation seems to be ok currently. He reports right hand pain from fall.  Luckily, he suffered no fractures.  Head CT also negative for acute intracranial abnormalities but does show small right frontal scalp hematoma.    Wife present at bedside.  We discussed that he has end-stage heart failure and is in severe cardiogenic shock and unfortunately not a candidate for  advanced therapies.  We discussed palliative care consultation to discuss goals of care and transition to comfort/hospice care. This was well received. We discussed possibility of transferring to residential hospice however patient strongly expressed desire for home hospice care. STAT palliative care consult placed.     Cardiac Studies: - Echo (8/22): EF<20%, LV severely decreased, Grade [name] DD, moderate down RV, severe bi-atrial dilation, moderate MR/TR, moderate left pleural effusion, mild AS with calcification.   - Echo 06/09/16 20-25%, Grade 2 DD, Trivial AI, Mild MR, Mod LAE, PA peak pressure 53 mm Hg.   - Echo (11/20): EF 15-20%, global LV hypokinesis, mildly reduced RV, severe bi-atrial dilation, moderate TR, moderate AS with thickening   - [facility] 06/10/16  Ramus lesion, 40 %stenosed. Ost RCA to Prox RCA lesion, 10 %stenosed. Mid RCA lesion, 0 %stenosed. Dist RCA lesion, 10 %stenosed. Ost LM to LM lesion, 30 %stenosed. Ost LAD lesion, 40 %stenosed. LV end diastolic pressure is moderately elevated. Hemodynamic findings consistent with mild pulmonary hypertension.   1. Nonobstructive CAD. Continued patency of stents in the RCA 2. Elevated LVEDP 3. Mild pulmonary HTN. 4. Reduced cardiac output. 3.89 L/min with index of 1.5   [facility] Procedural Findings: Hemodynamics (mmHg) RA mean 16 mm Hg RV 42/11 mm Hg PA 43/23 (31) mm Hg  PCWP 23 mm Hg AO 104/72 mm Hg Cardiac Output (Fick) 3.89  Cardiac Index (Fick) 1.54   Review of Systems: [y] = yes, _0  = no   General: Weight gain [Y ]; Weight loss _1 ; Anorexia [ Y]; Fatigue [Y]; Fever _2 ; Chills _3 ; Weakness [ Y]  Cardiac: Chest pain/pressure _4 ; Resting SOB _5 ; Exertional SOB [ Y]; Orthopnea _6 ; Pedal Edema [Y ]; Palpitations _7 ; Syncope _8 ; Presyncope _9 ; Paroxysmal nocturnal dyspnea_10   Pulmonary: Cough _11 ; Wheezing_12 ; Hemoptysis_13 ; Sputum _14 ; Snoring _15   GI: Vomiting_16 ; Dysphagia_17 ; Melena_18 ; Hematochezia _19 ;  Heartburn_20 ; Abdominal pain _21 ; Constipation _22 ; Diarrhea _23 ; BRBPR _24   GU: Hematuria_25 ; Dysuria _26 ; Nocturia_27   Vascular: Pain in legs with walking _28 ; Pain in feet with lying flat _29 ; Non-healing sores [ Y]; Stroke _30 ; TIA _31 ; Slurred speech _32 ;  Neuro: Headaches_33 ; Vertigo_34 ; Seizures_35 ; Paresthesias_36 ;Blurred vision _37 ; Diplopia _38 ; Vision changes _39   Ortho/Skin: Arthritis _40 ; Joint pain _41 ; Muscle pain _42 ; Joint swelling _43 ; Back Pain _44 ; Rash _45   Psych: Depression_46 ; Anxiety_47   Heme: Bleeding problems _48 ; Clotting disorders _49 ; Anemia _50   Endocrine: Diabetes _51 ; Thyroid dysfunction_52   Home Medications Prior to Admission medications   Medication Sig Start Date End Date Taking? Authorizing Provider  acetaminophen (TYLENOL) 325 MG tablet Take 650 mg by mouth every 4 (four) hours as needed for mild pain or headache.   Yes [provider]  amiodarone (PACERONE) 100 MG tablet Take  100 mg by mouth daily.   Yes [provider]  atorvastatin (LIPITOR) 80 MG tablet Take 80 mg by mouth every evening.   Yes [provider]  Cholecalciferol (VITAMIN D3) 50 MCG (2000 UT) TABS Take 50 mcg by mouth daily.   Yes [provider]  clotrimazole (LOTRIMIN) 1 % cream Apply 1 application topically 2 (two) times daily. To groin, folds   Yes [provider]  empagliflozin (JARDIANCE) 10 MG TABS tablet Take 10 mg by mouth daily.   Yes [provider]  Ensure (ENSURE) Take 237 mLs by mouth in the morning and at bedtime.   Yes [provider]  ezetimibe (ZETIA) 10 MG tablet Take 10 mg by mouth at bedtime.   Yes [provider]  levothyroxine (SYNTHROID) 125 MCG tablet Take 125 mcg by mouth daily.   Yes [provider]  midodrine (PROAMATINE) 5 MG tablet Take 5 mg by mouth in the morning, at noon, and at bedtime.   Yes [provider]  Multiple Vitamins-Minerals (THEREMS-M) TABS Take 1 tablet by  mouth daily.   Yes [provider]  ondansetron (ZOFRAN) 4 MG tablet Take 4 mg by mouth every 8 (eight) hours as needed for nausea or vomiting.   Yes [provider]  tamsulosin (FLOMAX) 0.4 MG CAPS capsule Take 0.4 mg by mouth daily.   Yes [provider]  ticagrelor (BRILINTA) 60 MG TABS tablet Take 60 mg by mouth 2 (two) times daily.   Yes [provider]  torsemide (DEMADEX) 20 MG tablet Take 20 mg by mouth daily.   Yes [provider]  venlafaxine (EFFEXOR) 75 MG tablet Take 75 mg by mouth daily.   Yes [provider]    Past Medical History: Past Medical History:  Diagnosis Date   CAD (coronary artery disease)    Chronic systolic heart failure (HCC)    CKD (chronic kidney disease), stage [name] ([place])    Hyperlipidemia    Hypertension    ICD (implantable cardioverter-defibrillator) in place    Ventricular tachycardia ([place])     Past Surgical History: Past Surgical History:  Procedure Laterality Date   CORONARY ANGIOPLASTY WITH STENT PLACEMENT  2017   ICD IMPLANT  2017    Family History: Family History  Problem Relation Age of Onset   Diabetes Mother    Heart disease Paternal Grandmother    Heart disease Paternal Grandfather     Social History: Social History   Socioeconomic History   Marital status: Married    Spouse name: Not on file   Number of children: Not on file   Years of education: Not on file   Highest education level: Not on file  Occupational History   Not on file  Tobacco Use   Smoking status: Not on file   Smokeless tobacco: Not on file  Substance and Sexual Activity   Alcohol use: Not on file   Drug use: Not on file   Sexual activity: Not on file  Other Topics Concern   Not on file  Social History Narrative   Not on file   Social Determinants of Health   Financial Resource Strain: Not on file  Food Insecurity: Not on file  Transportation Needs: Not on file  Physical Activity: Not on  file  Stress: Not on file  Social Connections: Not on file    Allergies:  Allergies  Allergen Reactions   Clam Shell    Mixed Ragweed    Shellfish Allergy  Sulfa Antibiotics     Objective:    Vital Signs:   Temp:  [96.7 F (35.9 C)] 96.7 F (35.9 C) (08/24 0853) Pulse Rate:  [53-107] 53 (08/24 1050) Resp:  [15-22] 19 (08/24 1050) BP: (80-88)/(54-67) 87/58 (08/24 1035) SpO2:  [92 %-100 %] 92 % (08/24 1050) Weight:  [113.4 kg] 113.4 kg (08/24 0906)    Weight change: Filed Weights    0906  Weight: 113.4 kg    Intake/Output:   Intake/Output Summary (Last 24 hours) at 08/21/2020 1219 Last data filed at 09/07/2020 1021 Gross per 24 hour  Intake 1000 ml  Output 0 ml  Net 1000 ml      Physical Exam    General: Chronically ill-appearing elderly WM No resp difficulty HEENT: normal Neck: supple. JVP elevated to jawline. Carotids 2+ bilat; no bruits. No lymphadenopathy or thyromegaly appreciated. Cor: PMI nondisplaced. Regular rate & rhythm. No rubs, gallops or murmurs. Lungs: Decreased breath sounds at the bases bilaterally  Abdomen: obese, soft, nontender, nondistended. No hepatosplenomegaly. No bruits or masses. Good bowel sounds. + Ecchymosis left lower abdomen (wife attributes to subq heparin injections) Extremities: no cyanosis, clubbing, rash, 3+ bilateral LEE up to thighs, LEs warm + skin tear dorsal aspect of rt hand and bleeding  Neuro: alert & orientedx3, cranial nerves grossly intact. moves all 4 extremities w/o difficulty. Affect pleasant   Telemetry   N/A   EKG    Not performed   Labs   Basic Metabolic Panel: Recent Labs  Lab 08/27/2020 0855 09/01/2020 0910  NA 129* 127*  K 5.2* 5.4*  CL 97* 100  CO2  --  16*  GLUCOSE 89 94  BUN 84* 88*  CREATININE 4.60* 4.37*  CALCIUM  --  8.1*    Liver Function Tests: Recent Labs  Lab  0910  AST 38  ALT 40  ALKPHOS 114  BILITOT 2.2*  PROT 5.6*  ALBUMIN 3.0*   No results for  input(s): LIPASE, AMYLASE in the last 168 hours. No results for input(s): AMMONIA in the last 168 hours.  CBC: Recent Labs  Lab 09/06/2020 0855 09/16/2020 0910  WBC  --  11.4*  HGB 13.9 12.4*  HCT 41.0 42.5  MCV  --  91.0  PLT  --  240    Cardiac Enzymes: No results for input(s): CKTOTAL, CKMB, CKMBINDEX, TROPONINI in the last 168 hours.  BNP: BNP (last 3 results) No results for input(s): BNP in the last 8760 hours.  ProBNP (last 3 results) No results for input(s): PROBNP in the last 8760 hours.   CBG: No results for input(s): GLUCAP in the last 168 hours.  Coagulation Studies: Recent Labs    09/07/2020 0910  LABPROT 16.3*  INR 1.3*     Imaging   CT HEAD WO CONTRAST (5MM)  Result Date: 08/22/2020 CLINICAL DATA:  Fall, on blood thinners, recently discharged from hospital for multiple falls, head trauma EXAM: CT HEAD WITHOUT CONTRAST TECHNIQUE: Contiguous axial images were obtained from the base of the skull through the vertex without intravenous contrast. Sagittal and coronal MPR images reconstructed from axial data set. COMPARISON:  05/21/2019 FINDINGS: Brain: Beam hardening/motion artifacts in the frontal regions bilaterally. Generalized atrophy. Normal ventricular morphology. No midline shift or mass effect. Minimal small vessel chronic ischemic changes of deep cerebral white matter. No intracranial hemorrhage, mass lesion, or evidence of acute infarction. No extra-axial fluid collections. Vascular: Atherosclerotic calcification of internal carotid arteries bilaterally at skull base. Skull: Small RIGHT frontal scalp hematoma.  No calvarial fractures. Sinuses/Orbits: Clear Other: N/A IMPRESSION: Atrophy with minimal small vessel chronic ischemic changes of deep cerebral white matter. No acute intracranial abnormalities. Small RIGHT frontal scalp hematoma. Electronically Signed   By: Lavonia Dana M.D.   On: 09/03/2020 09:31   CT Cervical Spine Wo Contrast  Result Date:  08/19/2020 CLINICAL DATA:  Fall, on blood thinners EXAM: CT CERVICAL SPINE WITHOUT CONTRAST TECHNIQUE: Multidetector CT imaging of the cervical spine was performed without intravenous contrast. Multiplanar CT image reconstructions were also generated. Examination degraded by motion artifacts. COMPARISON:  None FINDINGS: Alignment: Minimal retrolisthesis at C5-C6. Remaining alignments normal. Skull base and vertebrae: Osseous mineralization normal. Skull base intact. Vertebral body heights maintained. Multilevel degenerative disc and facet disease changes of cervical spine. No fracture, subluxation, or bone destruction. Significant narrowing of LEFT C5-C6 neural foramen by retrolisthesis, uncovertebral hypertrophy and facet degenerative changes. Soft tissues and spinal canal: Prevertebral soft tissues normal thickness. Extensive atherosclerotic calcifications at the carotid bifurcations. Disc levels:  No specific abnormalities. Upper chest: LEFT pleural effusion.  Lung apices otherwise clear. Other: N/A IMPRESSION: Significant degenerative disc and facet disease changes of the cervical spine as above. No acute cervical spine abnormalities. LEFT pleural effusion. Significant atherosclerotic calcifications at the carotid bifurcations. Electronically Signed   By: Lavonia Dana M.D.   On: 08/25/2020 09:35   DG Pelvis Portable  Result Date: 09/08/2020 CLINICAL DATA:  Fall, on blood thinners EXAM: PORTABLE PELVIS 1-2 VIEWS COMPARISON:  Portable exam 0847 hours without priors for comparison FINDINGS: Mild osseous demineralization. Hip and SI joint spaces preserved. No fracture, dislocation, or bone destruction. Degenerative disc and facet disease changes at visualized lower lumbar spine. IMPRESSION: No acute pelvic abnormalities. Electronically Signed   By: Lavonia Dana M.D.   On: 09/01/2020 09:24   DG Chest Portable 1 View  Result Date: 09/10/2020 CLINICAL DATA:  Fall, on blood thinners EXAM: PORTABLE CHEST 1 VIEW  COMPARISON:  Portable exam 5277 hours compared to 08/31/2020 FINDINGS: LEFT subclavian ICD with lead projecting over RIGHT ventricle. Enlargement of cardiac silhouette with slight pulmonary vascular congestion. Mediastinal contours normal with atherosclerotic calcification at aortic arch. LEFT pleural effusion and basilar atelectasis increased from previous exam. Remaining lungs clear. No pneumothorax or definite fractures. Scattered endplate spur formation thoracic spine. IMPRESSION: Enlargement of cardiac silhouette with pulmonary vascular congestion post ICD. Increased LEFT pleural effusion and basilar atelectasis. Aortic Atherosclerosis (ICD10-I70.0). Electronically Signed   By: Lavonia Dana M.D.   On: 08/20/2020 09:23   DG Hand Complete Right  Result Date: 08/26/2020 CLINICAL DATA:  Fall, on blood thinners, tenderness everywhere, abrasion posterior RIGHT hand EXAM: RIGHT HAND - COMPLETE 3+ VIEW COMPARISON:  None FINDINGS: Osseous mineralization grossly normal. Degenerative changes at first [facility] joint, second and third MCP joints, and radiocarpal joint. Soft tissue swelling overlying dorsum of hand at the level of the MCP joints. No acute fracture, dislocation, or bone destruction. IMPRESSION: Scattered degenerative changes as above. No acute osseous abnormalities. Electronically Signed   By: Lavonia Dana M.D.   On: 09/17/2020 09:22   CT CHEST ABDOMEN PELVIS WO CONTRAST  Result Date: 09/01/2020 CLINICAL DATA:  Abdominal trauma. Recently discharged from hospital. Patient fell at nursing home and has bilateral lower quadrant bruises as well as bruising to arms and legs. EXAM: CT CHEST, ABDOMEN AND PELVIS WITHOUT CONTRAST TECHNIQUE: Multidetector CT imaging of the chest, abdomen and pelvis was performed following the standard protocol without IV contrast. COMPARISON:  None. FINDINGS: Of note, patient respiratory motion artifact  degrades image quality. CT CHEST FINDINGS Cardiovascular: Cardiomegaly, stable  compared to chest radiograph 12/17/2018. Left chest wall pacemaker with single lead within the right ventricle. Multivessel severe coronary artery atherosclerosis. Mild calcifications of the aortic arch. Mediastinum/Nodes: No enlarged mediastinal, hilar, or axillary lymph nodes. Thyroid gland, trachea, and esophagus demonstrate no significant findings. Lungs/Pleura: Moderate layering left pleural effusion with compressive subsegmental atelectasis in the left lower lobe. Subsegmental dependent atelectasis in the left upper lobe and right lower lobe. Punctate calcified granuloma in the right lower lobe (series 4, image 79). Musculoskeletal: Mild dextroscoliosis of the thoracic spine with flowing anterior osteophytes, consistent with diffuse idiopathic skeletal hyperostosis. Multilevel mild degenerative disc disease throughout the mid and distal thoracic spine. No fracture. CT ABDOMEN PELVIS FINDINGS Hepatobiliary: No focal liver abnormality is seen. Layering mildly hyperdense material in the gallbladder body, likely gallbladder sludge. The gallbladder is not distended. No gallbladder wall thickening or biliary dilatation. Pancreas: Diffuse moderate parenchymal atrophy and fatty infiltration. No dilatation of the main pancreatic duct. No adjacent inflammatory changes. Spleen: Normal in size without focal abnormality. Adrenals/Urinary Tract: Adrenal glands are unremarkable. Mild atrophy of the kidneys. No renal calculi, focal lesion, or hydronephrosis. Bladder is unremarkable. Stomach/Bowel: Stomach is within normal limits. Appendix appears normal. Incidentally noted 2.5 cm lipoma in the transverse portion of the duodenum (series 5, image 43; series 3, image 81). No evidence of bowel wall thickening, distention, or inflammatory changes. Vascular/Lymphatic: Severe calcific atherosclerosis is noted at the origin of the SMA (series 3, image 67; series 5, image 50). Severe calcific atherosclerosis at the origin of the left  renal artery (series 3, image 71). 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 Scattered mild-to-moderate calcific atherosclerosis of the bilateral common femoral arteries. No abdominal aortic aneurysm. No enlarged abdominal or pelvic lymph nodes. Reproductive: Prostate is unremarkable. Other: Trace simple ascites is noted within the hepatorenal recess. Fat stranding is noted within the lateral abdominal wall bilaterally, left-greater-than-right. No hematoma of the abdominal wall musculature. No subcutaneous fluid collection. Musculoskeletal: Levoscoliosis of the lumbar spine centered at the L3 vertebral body. Multilevel moderate-to-severe degenerative disc disease at the L2 through S1 levels with prominent right marginal osteophytes. Advanced degenerative changes of the sacroiliac joints with vacuum phenomenon. No fracture. A 1.7 x 1.4 cm lytic lesion in right pubic body has coarse trabecula (series 3, image 121, series 5, image 43). IMPRESSION: CT Chest: 1. Moderate layering left pleural effusion with subsegmental compressive left lower lobe atelectasis. 2. Stable cardiomegaly. 3. No fracture. CT Abdomen/Pelvis: 1. No fracture. No findings to suggest intra-abdominal trauma on this unenhanced exam. 2. Fat stranding within the lateral abdominal wall bilaterally, left greater than right, consistent with clinical history of lower quadrant bruising. No subcutaneous fluid collection or muscular hematoma in the visualized abdominal wall. 3. A 1.7 cm lytic lesion in the right pubic body with coarse trabecula is favored to represent a hemangioma. However multiple myeloma is also in the differential. Consider MRI on a non-emergent basis for further characterization. 4. Aortic atherosclerosis with severe calcific atherosclerosis at the origins of the SMA and left renal artery. Aortic Atherosclerosis (ICD10-I70.0). Electronically Signed   By: Ileana Roup M.D.   On: 08/25/2020 10:13      Medications:     Current Medications:   Infusions:  sodium chloride       Assessment/Plan    1. Acute on Chronic End-Stage Biventricular Systolic Heart Failure w/ Profound Cardiogenic Shock: - Echo 06/09/16 20-25%, Grade  2 DD, Trivial AI, Mild MR, Mod LAE, PA peak pressure 53 mm Hg. - Echo (8/22): EF<20%, LV severely decreased, Grade [name] DD, moderate down RV, severe bi-atrial dilation, moderate MR/TR, moderate left pleural effusion, mild AS with calcification. - recent functional decline and multiple hospitalizations for a/c CHF. NYHA Class IV.  - GDMT recently limited by hypotension and AKI. - Now w/ profound shock, CO2 16, Lactic Acid 2.5. Worsening renal failure w/ SCr now >4.0  - Not a candidate for advanced therapies  - Discussed w/ pt and wife that he is end-stage and recommended transition to comfort/ hospice care and they are in agreement.  - STAT Palliative Care consult placed to assist w/ transition to Hospice  - He is currently markedly fluid overloaded on exam but currently stable and comfortable from respiratory standpoint. IV Lasix held in ED due to hypotension - can use IV Lasix PRN for comfort if development of respiratory distress  - Deactivate ICD   2. AKI on Stage [name] CKD - followed by Dr. Joelyn Oms - baseline SCr ~2.6 - SCr now 4.6 2/2 cardiorenal syndrome / CGS - transitioning to comfort care    3. CAD  - s/p posterior STEMI/PCI with two overlapping DES to RCA 07/2015 - Stable, Non-obstructive CAD on cath 05/2016 - No chest pain.  - Hold Brilinta given scalp hematoma    4. H/O of VT - Will deactivate ICD   5. Hyperkalemia - K 5.4  - no treatment planned for now as he is being transitioned to comfort care   6. Hypervolemic Hyponatremia - Na 127  7. Scalp Hematoma - 2/2 mechanical fall - Head CT negative for intracranial bleed  - stop Brilinta   Length of Stay: 0  Lyda Jester, PA-C  09/18/2020, 12:19 PM  Advanced Heart Failure  Team Pager [phone] (M-F; 7a - 5p)  Please contact [facility] for night-coverage after hours (4p -7a ) and weekends on [email or web address]  Patient seen and examined with the above-signed Advanced Practice Provider and/or Housestaff. I personally reviewed laboratory data, imaging studies and relevant notes. I independently examined the patient and formulated the important aspects of the plan. I have edited the note to reflect any of my changes or salient points. I have personally discussed the plan with the patient and/or family.  75 y/o male with severe systolic HF due to iCM, CKD IV recently admitted with hypotension and fall. Echo with EF < 20%. Discharged to SNF.   Now presents with FTT and cardiogenic shock with lactic acidosis. Creatinine up to 4.6  General:  Terminally ill appearing falls asleep midsentence HEENT: normal Neck: supple. JVP to ear Carotids 2+ bilat; no bruits. No lymphadenopathy or thryomegaly appreciated. Cor: PMI nondisplaced. Regular rate & rhythm. 2/6 SEM at apex Lungs: coarse Abdomen: soft, nontender, nondistended. No hepatosplenomegaly. No bruits or masses. Good bowel sounds. Extremities: no cyanosis, clubbing, rash, 3+ edema cool  Neuro: awake but falls asleep mid-sentence. Diffusely weak but non-focal   He is actively dying from advanced HF and cardiogenic shock. Long talk with him and his wife and they are in agreement with Hospice Care. We have reached out to the Palliative Care team for urgent consultation for possible placement at [facility]. We have deactivated ICD. Recommend starting morphine gtt for comfort.   Glori Bickers, MD  5:48 PM

## 2020-09-11 NOTE — Progress Notes (Signed)
Orthopedic Tech Progress Note Patient Details:  Leonard [name] 09-30-1945 [id number]  Level 2 trauma  Patient ID: Leonard [name], male   DOB: February 25, 1945, 75 y.o.   MRN: [id number]  Leonard [name] 08/19/2020, 9:25 AM

## 2020-09-11 NOTE — Progress Notes (Signed)
CSW spoke with patients wife who was opened to someone from [place] speaking to her about her husbands care. Patients wife is interested in [employer]. Patients wife stated their children should be coming up to the hospital to see him today. CSW asked patients wife if there was anything else that she needed and she stated no

## 2020-09-12 ENCOUNTER — Telehealth: Payer: Self-pay

## 2020-09-12 DIAGNOSIS — I5023 Acute on chronic systolic (congestive) heart failure: Secondary | ICD-10-CM | POA: Diagnosis not present

## 2020-09-12 DIAGNOSIS — I5082 Biventricular heart failure: Secondary | ICD-10-CM | POA: Diagnosis not present

## 2020-09-12 DIAGNOSIS — R570 Cardiogenic shock: Secondary | ICD-10-CM | POA: Diagnosis not present

## 2020-09-12 DIAGNOSIS — N189 Chronic kidney disease, unspecified: Secondary | ICD-10-CM | POA: Diagnosis not present

## 2020-09-12 DIAGNOSIS — I5084 End stage heart failure: Secondary | ICD-10-CM | POA: Diagnosis not present

## 2020-09-12 DIAGNOSIS — I5043 Acute on chronic combined systolic (congestive) and diastolic (congestive) heart failure: Secondary | ICD-10-CM | POA: Diagnosis not present

## 2020-09-12 DIAGNOSIS — N179 Acute kidney failure, unspecified: Secondary | ICD-10-CM | POA: Diagnosis not present

## 2020-09-12 DIAGNOSIS — Z515 Encounter for palliative care: Secondary | ICD-10-CM | POA: Diagnosis not present

## 2020-09-12 DIAGNOSIS — I509 Heart failure, unspecified: Secondary | ICD-10-CM | POA: Diagnosis not present

## 2020-09-12 DIAGNOSIS — L8990 Pressure ulcer of unspecified site, unspecified stage: Secondary | ICD-10-CM | POA: Insufficient documentation

## 2020-09-12 MED ORDER — HYDROMORPHONE BOLUS VIA INFUSION
2.0000 mg | Freq: Once | INTRAVENOUS | Status: AC
  Administered 2020-09-12: 2 mg via INTRAVENOUS
  Filled 2020-09-12: qty 2

## 2020-09-12 MED ORDER — SODIUM CHLORIDE 0.9 % IV SOLN
3.0000 mg/h | INTRAVENOUS | Status: DC
  Administered 2020-09-12 – 2020-09-13 (×2): 2 mg/h via INTRAVENOUS
  Filled 2020-09-12 (×2): qty 5

## 2020-09-12 MED ORDER — LORAZEPAM 2 MG/ML PO CONC: 1.0000 mg | ORAL | Status: DC | PRN

## 2020-09-12 MED ORDER — LORAZEPAM 1 MG PO TABS: 2.0000 mg | ORAL_TABLET | ORAL | Status: DC | PRN

## 2020-09-12 MED ORDER — LORAZEPAM 2 MG/ML IJ SOLN
2.0000 mg | INTRAMUSCULAR | Status: DC | PRN
  Administered 2020-09-12 – 2020-09-13 (×2): 2 mg via INTRAVENOUS
  Filled 2020-09-12: qty 1

## 2020-09-12 MED ORDER — LORAZEPAM 2 MG/ML IJ SOLN: 1.0000 mg | INTRAMUSCULAR | Status: DC | PRN

## 2020-09-12 NOTE — Progress Notes (Signed)
[facility] PROGRESS NOTE    Progress Note  Leonard [name]  ZOX:[id number] DOB: 1945-02-27 DOA: 09/12/2020 PCP: Girtha Rm, NP-C     Brief Narrative:   Leonard [name] is an 75 y.o. male past medical history of combined systolic and diastolic heart failure, with ischemic cardiomyopathy with an EF of 81% grade 2 diastolic heart failure, is also history of a posterior STEMI with 2 overlapping DES stent to the RCA last cath in 2017, with a single-chamber ICD placed in 2017 for VT morbid obesity recently admitted on last month for acute kidney injury after fluid resuscitation and his creatinine returning to baseline he was was discharged on diuretic therapy followed up with the advanced heart failure clinic today and was found to be in worsening new acute kidney injury, bilateral pleural effusion hypotensive.  Cardiology spoke to him about palliative care and comfort measures and what does not mean in the setting of advanced heart failure with worsening renal function.  And she agreed to move towards comfort care he was made a DNR/DNI. He was sent to the ED but unfortunately we cannot place him at a residential hospice facility so we will admit him to the hospital.     Assessment/Plan:   Acute combined systolic and diastolic heart failure/  Biventricular failure (HCC)/cardiogenic shock: Continues to be hypotensive requiring 2 L to keep saturations greater 90%. We have notified transitions of care to help with placement to hospice facility. Continue IV Dilaudid for agitation shortness of breath or pain.  Discontinue IV morphine  Acute on chronic combined systolic and diastolic CHF (congestive heart failure) (HCC) Acute kidney injury superimposed on CKD (Kyle) Encounter for disabling of implantable cardioverter-defibrillator (ICD) in patient receiving comfort measures only Cardiorenal syndrome with renal failure Pressure injury of skin Noted all medical problems, continue  only medications that are required for comfort.    DVT prophylaxis: none Family Communication:daughter Status is: Inpatient  Remains inpatient appropriate because:Hemodynamically unstable  Dispo: The patient is from: Home              Anticipated d/c is to:  Residential hospice facility              Patient currently is not medically stable to d/c.   Difficult to place patient No   Code Status:     Code Status Orders  (From admission, onward)           Start     Ordered   09/12/2020 1857  Do not attempt resuscitation (DNR)  Continuous       Question Answer Comment  In the event of cardiac or respiratory ARREST Do not call a "code blue"   In the event of cardiac or respiratory ARREST Do not perform Intubation, CPR, defibrillation or ACLS   In the event of cardiac or respiratory ARREST Use medication by any route, position, wound care, and other measures to relive pain and suffering. May use oxygen, suction and manual treatment of airway obstruction as needed for comfort.      09/05/2020 1856           Code Status History     Date Active Date Inactive Code Status Order ID Comments User Context   08/31/2020 1341 08/31/2020 1856 DNR [id number]  Isla Pence, MD ED   09/06/2020 1100 08/31/2020 1340 DNR [id number]  Isla Pence, MD ED         IV Access:   Peripheral IV   Procedures and diagnostic studies:  CT HEAD WO CONTRAST (5MM)  Result Date: 08/21/2020 CLINICAL DATA:  Fall, on blood thinners, recently discharged from hospital for multiple falls, head trauma EXAM: CT HEAD WITHOUT CONTRAST TECHNIQUE: Contiguous axial images were obtained from the base of the skull through the vertex without intravenous contrast. Sagittal and coronal MPR images reconstructed from axial data set. COMPARISON:  05/21/2019 FINDINGS: Brain: Beam hardening/motion artifacts in the frontal regions bilaterally. Generalized atrophy. Normal ventricular morphology. No midline shift or mass  effect. Minimal small vessel chronic ischemic changes of deep cerebral white matter. No intracranial hemorrhage, mass lesion, or evidence of acute infarction. No extra-axial fluid collections. Vascular: Atherosclerotic calcification of internal carotid arteries bilaterally at skull base. Skull: Small RIGHT frontal scalp hematoma.  No calvarial fractures. Sinuses/Orbits: Clear Other: N/A IMPRESSION: Atrophy with minimal small vessel chronic ischemic changes of deep cerebral white matter. No acute intracranial abnormalities. Small RIGHT frontal scalp hematoma. Electronically Signed   By: Lavonia Dana M.D.   On: 09/01/2020 09:31   CT Cervical Spine Wo Contrast  Result Date: 09/03/2020 CLINICAL DATA:  Fall, on blood thinners EXAM: CT CERVICAL SPINE WITHOUT CONTRAST TECHNIQUE: Multidetector CT imaging of the cervical spine was performed without intravenous contrast. Multiplanar CT image reconstructions were also generated. Examination degraded by motion artifacts. COMPARISON:  None FINDINGS: Alignment: Minimal retrolisthesis at C5-C6. Remaining alignments normal. Skull base and vertebrae: Osseous mineralization normal. Skull base intact. Vertebral body heights maintained. Multilevel degenerative disc and facet disease changes of cervical spine. No fracture, subluxation, or bone destruction. Significant narrowing of LEFT C5-C6 neural foramen by retrolisthesis, uncovertebral hypertrophy and facet degenerative changes. Soft tissues and spinal canal: Prevertebral soft tissues normal thickness. Extensive atherosclerotic calcifications at the carotid bifurcations. Disc levels:  No specific abnormalities. Upper chest: LEFT pleural effusion.  Lung apices otherwise clear. Other: N/A IMPRESSION: Significant degenerative disc and facet disease changes of the cervical spine as above. No acute cervical spine abnormalities. LEFT pleural effusion. Significant atherosclerotic calcifications at the carotid bifurcations. Electronically  Signed   By: Lavonia Dana M.D.   On: 08/30/2020 09:35   DG Pelvis Portable  Result Date: 09/03/2020 CLINICAL DATA:  Fall, on blood thinners EXAM: PORTABLE PELVIS 1-2 VIEWS COMPARISON:  Portable exam 0847 hours without priors for comparison FINDINGS: Mild osseous demineralization. Hip and SI joint spaces preserved. No fracture, dislocation, or bone destruction. Degenerative disc and facet disease changes at visualized lower lumbar spine. IMPRESSION: No acute pelvic abnormalities. Electronically Signed   By: Lavonia Dana M.D.   On: 09/02/2020 09:24   DG Chest Portable 1 View  Result Date: 09/17/2020 CLINICAL DATA:  Fall, on blood thinners EXAM: PORTABLE CHEST 1 VIEW COMPARISON:  Portable exam 5102 hours compared to 08/31/2020 FINDINGS: LEFT subclavian ICD with lead projecting over RIGHT ventricle. Enlargement of cardiac silhouette with slight pulmonary vascular congestion. Mediastinal contours normal with atherosclerotic calcification at aortic arch. LEFT pleural effusion and basilar atelectasis increased from previous exam. Remaining lungs clear. No pneumothorax or definite fractures. Scattered endplate spur formation thoracic spine. IMPRESSION: Enlargement of cardiac silhouette with pulmonary vascular congestion post ICD. Increased LEFT pleural effusion and basilar atelectasis. Aortic Atherosclerosis (ICD10-I70.0). Electronically Signed   By: Lavonia Dana M.D.   On: 08/21/2020 09:23   DG Hand Complete Right  Result Date: 09/05/2020 CLINICAL DATA:  Fall, on blood thinners, tenderness everywhere, abrasion posterior RIGHT hand EXAM: RIGHT HAND - COMPLETE 3+ VIEW COMPARISON:  None FINDINGS: Osseous mineralization grossly normal. Degenerative changes at first [facility] joint, second and third MCP joints,  and radiocarpal joint. Soft tissue swelling overlying dorsum of hand at the level of the MCP joints. No acute fracture, dislocation, or bone destruction. IMPRESSION: Scattered degenerative changes as above. No acute  osseous abnormalities. Electronically Signed   By: Lavonia Dana M.D.   On: 08/28/2020 09:22   CT CHEST ABDOMEN PELVIS WO CONTRAST  Result Date: 08/24/2020 CLINICAL DATA:  Abdominal trauma. Recently discharged from hospital. Patient fell at nursing home and has bilateral lower quadrant bruises as well as bruising to arms and legs. EXAM: CT CHEST, ABDOMEN AND PELVIS WITHOUT CONTRAST TECHNIQUE: Multidetector CT imaging of the chest, abdomen and pelvis was performed following the standard protocol without IV contrast. COMPARISON:  None. FINDINGS: Of note, patient respiratory motion artifact degrades image quality. CT CHEST FINDINGS Cardiovascular: Cardiomegaly, stable compared to chest radiograph 12/17/2018. Left chest wall pacemaker with single lead within the right ventricle. Multivessel severe coronary artery atherosclerosis. Mild calcifications of the aortic arch. Mediastinum/Nodes: No enlarged mediastinal, hilar, or axillary lymph nodes. Thyroid gland, trachea, and esophagus demonstrate no significant findings. Lungs/Pleura: Moderate layering left pleural effusion with compressive subsegmental atelectasis in the left lower lobe. Subsegmental dependent atelectasis in the left upper lobe and right lower lobe. Punctate calcified granuloma in the right lower lobe (series 4, image 79). Musculoskeletal: Mild dextroscoliosis of the thoracic spine with flowing anterior osteophytes, consistent with diffuse idiopathic skeletal hyperostosis. Multilevel mild degenerative disc disease throughout the mid and distal thoracic spine. No fracture. CT ABDOMEN PELVIS FINDINGS Hepatobiliary: No focal liver abnormality is seen. Layering mildly hyperdense material in the gallbladder body, likely gallbladder sludge. The gallbladder is not distended. No gallbladder wall thickening or biliary dilatation. Pancreas: Diffuse moderate parenchymal atrophy and fatty infiltration. No dilatation of the main pancreatic duct. No adjacent  inflammatory changes. Spleen: Normal in size without focal abnormality. Adrenals/Urinary Tract: Adrenal glands are unremarkable. Mild atrophy of the kidneys. No renal calculi, focal lesion, or hydronephrosis. Bladder is unremarkable. Stomach/Bowel: Stomach is within normal limits. Appendix appears normal. Incidentally noted 2.5 cm lipoma in the transverse portion of the duodenum (series 5, image 43; series 3, image 81). No evidence of bowel wall thickening, distention, or inflammatory changes. Vascular/Lymphatic: Severe calcific atherosclerosis is noted at the origin of the SMA (series 3, image 67; series 5, image 50). Severe calcific atherosclerosis at the origin of the left renal artery (series 3, image 71). 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 Scattered mild-to-moderate calcific atherosclerosis of the bilateral common femoral arteries. No abdominal aortic aneurysm. No enlarged abdominal or pelvic lymph nodes. Reproductive: Prostate is unremarkable. Other: Trace simple ascites is noted within the hepatorenal recess. Fat stranding is noted within the lateral abdominal wall bilaterally, left-greater-than-right. No hematoma of the abdominal wall musculature. No subcutaneous fluid collection. Musculoskeletal: Levoscoliosis of the lumbar spine centered at the L3 vertebral body. Multilevel moderate-to-severe degenerative disc disease at the L2 through S1 levels with prominent right marginal osteophytes. Advanced degenerative changes of the sacroiliac joints with vacuum phenomenon. No fracture. A 1.7 x 1.4 cm lytic lesion in right pubic body has coarse trabecula (series 3, image 121, series 5, image 43). IMPRESSION: CT Chest: 1. Moderate layering left pleural effusion with subsegmental compressive left lower lobe atelectasis. 2. Stable cardiomegaly. 3. No fracture. CT Abdomen/Pelvis: 1. No fracture. No findings to suggest intra-abdominal trauma on this unenhanced exam. 2. Fat  stranding within the lateral abdominal wall bilaterally, left greater than right, consistent with clinical history of lower quadrant bruising. No subcutaneous fluid  collection or muscular hematoma in the visualized abdominal wall. 3. A 1.7 cm lytic lesion in the right pubic body with coarse trabecula is favored to represent a hemangioma. However multiple myeloma is also in the differential. Consider MRI on a non-emergent basis for further characterization. 4. Aortic atherosclerosis with severe calcific atherosclerosis at the origins of the SMA and left renal artery. Aortic Atherosclerosis (ICD10-I70.0). Electronically Signed   By: Ileana Roup M.D.   On: 09/16/2020 10:13     Medical Consultants:   None.   Subjective:    Leonard [name] sleepy comfortable  Objective:    Vitals:   08/26/2020 1221 09/08/2020 1223 09/09/2020 1851 09/12/20 0500  BP:  (!) 78/57 (!) 164/153 (!) 82/61  Pulse: (!) 59 (!) 45 98 60  Resp:  _0 Temp:   97.6 F (36.4 C) 97.6 F (36.4 C)  TempSrc:   Oral Tympanic  SpO2: 90% (!) 86% (!) 84% (!) 89%  Weight:      Height:       SpO2: (!) 89 %  No intake or output data in the 24 hours ending 09/12/20 1023 Filed Weights   09/17/2020 0906  Weight: 113.4 kg    Exam: General exam: In no acute distress. Respiratory system: Good air movement and clear to auscultation. Cardiovascular system: S1 & S2 heard, RRR.  Gastrointestinal system: Abdomen is nondistended, soft and nontender.  Extremities: No pedal edema. Skin: No rashes, lesions or ulcers  Data Reviewed:    Labs: Basic Metabolic Panel: Recent Labs  Lab 09/05/2020 0855 08/23/2020 0910  NA 129* 127*  K 5.2* 5.4*  CL 97* 100  CO2  --  16*  GLUCOSE 89 94  BUN 84* 88*  CREATININE 4.60* 4.37*  CALCIUM  --  8.1*   GFR Estimated Creatinine Clearance: 18.7 mL/min (A) (by C-G formula based on SCr of 4.37 mg/dL (H)). Liver Function Tests: Recent Labs  Lab 08/26/2020 0910  AST 38  ALT 40  ALKPHOS  114  BILITOT 2.2*  PROT 5.6*  ALBUMIN 3.0*   No results for input(s): LIPASE, AMYLASE in the last 168 hours. No results for input(s): AMMONIA in the last 168 hours. Coagulation profile Recent Labs  Lab 08/26/2020 0910  INR 1.3*   COVID-19 Labs  No results for input(s): DDIMER, FERRITIN, LDH, CRP in the last 72 hours.  Lab Results  Component Value Date   [place] NEGATIVE 08/25/2020    CBC: Recent Labs  Lab 08/25/2020 0855 08/29/2020 0910  WBC  --  11.4*  HGB 13.9 12.4*  HCT 41.0 42.5  MCV  --  91.0  PLT  --  240   Cardiac Enzymes: No results for input(s): CKTOTAL, CKMB, CKMBINDEX, TROPONINI in the last 168 hours. BNP (last 3 results) No results for input(s): PROBNP in the last 8760 hours. CBG: No results for input(s): GLUCAP in the last 168 hours. D-Dimer: No results for input(s): DDIMER in the last 72 hours. Hgb A1c: No results for input(s): HGBA1C in the last 72 hours. Lipid Profile: No results for input(s): CHOL, HDL, LDLCALC, TRIG, CHOLHDL, LDLDIRECT in the last 72 hours. Thyroid function studies: No results for input(s): TSH, T4TOTAL, T3FREE, THYROIDAB in the last 72 hours.  Invalid input(s): FREET3 Anemia work up: No results for input(s): VITAMINB12, FOLATE, FERRITIN, TIBC, IRON, RETICCTPCT in the last 72 hours. Sepsis Labs: Recent Labs  Lab 09/06/2020 0845 09/18/2020 0910 09/15/2020 1216  WBC  --  11.4*  --   LATICACIDVEN 2.5*  --  1.7   Microbiology Recent Results (from the past 240 hour(s))  Resp Panel by RT-PCR (Flu A&B, Covid) Nasopharyngeal Swab     Status: None   Collection Time: 08/20/2020  8:45 AM   Specimen: Nasopharyngeal Swab; Nasopharyngeal(NP) swabs in vial transport medium  Result Value Ref Range Status   SARS Coronavirus 2 by RT PCR NEGATIVE NEGATIVE Final    Comment: (NOTE) SARS-CoV-2 target nucleic acids are NOT DETECTED.  The SARS-CoV-2 RNA is generally detectable in upper respiratory specimens during the acute phase of infection.  The lowest concentration of SARS-CoV-2 viral copies this assay can detect is 138 copies/mL. A negative result does not preclude SARS-Cov-2 infection and should not be used as the sole basis for treatment or other patient management decisions. A negative result may occur with  improper specimen collection/handling, submission of specimen other than nasopharyngeal swab, presence of viral mutation(s) within the areas targeted by this assay, and inadequate number of viral copies(<138 copies/mL). A negative result must be combined with clinical observations, patient history, and epidemiological information. The expected result is Negative.  Fact Sheet for Patients:  EntrepreneurPulse.com.au  Fact Sheet for Healthcare Providers:  IncredibleEmployment.be  This test is no t yet approved or cleared by the [place] FDA and  has been authorized for detection and/or diagnosis of SARS-CoV-2 by FDA under an Emergency Use Authorization (EUA). This EUA will remain  in effect (meaning this test can be used) for the duration of the COVID-19 declaration under Section 564(b)(1) of the Act, 21 U.S.C.section 360bbb-3(b)(1), unless the authorization is terminated  or revoked sooner.       Influenza A by PCR NEGATIVE NEGATIVE Final   Influenza B by PCR NEGATIVE NEGATIVE Final    Comment: (NOTE) The Xpert Xpress SARS-CoV-2/FLU/RSV plus assay is intended as an aid in the diagnosis of influenza from Nasopharyngeal swab specimens and should not be used as a sole basis for treatment. Nasal washings and aspirates are unacceptable for Xpert Xpress SARS-CoV-2/FLU/RSV testing.  Fact Sheet for Patients: EntrepreneurPulse.com.au  Fact Sheet for Healthcare Providers: IncredibleEmployment.be  This test is not yet approved or cleared by the [place] FDA and has been authorized for detection and/or diagnosis of SARS-CoV-2 by FDA  under an Emergency Use Authorization (EUA). This EUA will remain in effect (meaning this test can be used) for the duration of the COVID-19 declaration under Section 564(b)(1) of the Act, 21 U.S.C. section 360bbb-3(b)(1), unless the authorization is terminated or revoked.  Performed at [facility], [place] [address] [place], [place] [address]      Medications:    sodium chloride flush  3 mL Intravenous Q12H   Continuous Infusions:  sodium chloride     morphine 2 mg/hr (09/16/2020 2006)      LOS: 1 day   Charlynne Cousins  [facility]  09/12/2020, 10:23 AM

## 2020-09-12 NOTE — Progress Notes (Signed)
Manufacturing engineer [facility]) Hospital Liaison note.    Rpt was deemed eligible for [facility] by [facility] MD.  Bed offered to pt's wife, Leonard [name], for transport on second shift.  [name] declined bed, stating that he was nearing his end and that she did not want to move him at this time.   Thank you for the opportunity to participate in this patient's care.  Domenic Moras, BSN, RN [facility] Liaison (listed on [place] under Hospice/[facility])    [phone] (24h on call)

## 2020-09-12 NOTE — Progress Notes (Signed)
Daily Progress Note   Patient Name: Leonard [name]       Date: 09/12/2020 DOB: 1946-01-20  Age: 75 y.o. MRN#: [id number] Attending Physician: Charlynne Cousins, MD Primary Care Physician: Girtha Rm, NP-C Admit Date: 08/23/2020  Reason for Consultation/Follow-up: end of life care, symptom management  Subjective: Patient appears comfortable. Unresponsive to voice and light touch. Respirations are even and unlabored. He does intermittently have brief periods of apnea.  No excessive respiratory secretions noted.   Family present at bedside. Education and counseling provided on expectations at EOL. Emotional support provided.     Length of Stay: 1  Current Medications: Scheduled Meds:   sodium chloride flush  3 mL Intravenous Q12H    Continuous Infusions:  sodium chloride     HYDROmorphone      PRN Meds: sodium chloride, acetaminophen **OR** acetaminophen, antiseptic oral rinse, diphenhydrAMINE, glycopyrrolate **OR** glycopyrrolate **OR** glycopyrrolate, haloperidol **OR** haloperidol **OR** haloperidol lactate, LORazepam **OR** LORazepam **OR** LORazepam, morphine, ondansetron **OR** ondansetron (ZOFRAN) IV, polyvinyl alcohol, sodium chloride flush        Vital Signs: BP (!) 82/61 (BP Location: Left Arm)   Pulse 60   Temp 97.6 F (36.4 C) (Tympanic)   Resp 16   Ht 6\' 1"  (1.854 m)   Wt 113.4 kg   SpO2 (!) 89%   BMI 32.98 kg/m  SpO2: SpO2: (!) 89 % O2 Device: O2 Device: Nasal Cannula O2 Flow Rate:          Palliative Assessment/Data: PPS 10%      Palliative Care Assessment & Plan   HPI/Patient Profile: 75 y.o. male  with past medical history of combined systolic and diastolic CHF due to ICM, grade 2 diastolic dysfunction, stage [name] chronic kidney  disease, CAD, STEMI with DES x 2 in 2017, ICD implanted in 2017 for v-tach, HTN, HLD, and morbid obesity. He [facility] to [facility] emergency department on 08/31/2020 after having a fall at his SNF.  In the ED, he was found to be in profound cardiogenic shock. Labs significant for CO2 16, lactic acid 2.5, and creatinine greater than 4. Per heart failure team, he is not a candidate for advanced therapies.  Palliative Medicine was asked to assist with symptom management and evaluation for hospice services.  Assessment: - acute on chronic combined heart failure - cardiogenic  shock - AKI on CKD - end of life care - dyspnea  Recommendations/Plan: Continue full comfort care Agree with starting hydromorphone infusion Awaiting bed at [facility] PMT will continue to follow  Goals of Care and Additional Recommendations: Limitations on Scope of Treatment: Full Comfort Care  Code Status: DNR/DNI   Prognosis:  Hours - Days  Discharge Planning: Hospice facility versus hospital death    Thank you for allowing the Palliative Medicine Team to assist in the care of this patient.   Total Time 20 minutes Prolonged Time Billed  no       Greater than 50%  of this time was spent counseling and coordinating care related to the above assessment and plan.  Lavena Bullion, NP  Please contact Palliative Medicine Team phone at [phone] for questions and concerns.

## 2020-09-12 NOTE — Progress Notes (Addendum)
IVF morphine Dc'd per order. Wasted 35 ml with Jasmine, RN but could not waste in the pyxis because order has been discontinued.  IV Ativan 2 ml administered per modified order, no need to waste 1 ml per previous order.  11:31 am IVF Dilaudid not started yet, awaiting medication from pharmacy.

## 2020-09-12 NOTE — Telephone Encounter (Signed)
Per Leonard [name] [facility] rep, the patient device is deactivated. Patient is in hospice care. His health is declining. I cancelled all upcoming remote appointments.

## 2020-09-12 NOTE — Progress Notes (Addendum)
Advanced Heart Failure Rounding Note  PCP-Cardiologist: None   Subjective:    Made full comfort. DNR/DNI. Transitioning to hospice. Waiting on residential hospice bed. ICD has been deactivated.   Comfort drips initiated by palliative care.   Daughter present at bedside and reports he had a peaceful night. Has been sleeping most of the morning and appears comfortable.   Objective:   Weight Range: 113.4 kg Body mass index is 32.98 kg/m.   Vital Signs:   Temp:  [97.6 F (36.4 C)] 97.6 F (36.4 C) (08/25 0500) Pulse Rate:  [45-107] 60 (08/25 0500) Resp:  [16-22] 16 (08/25 0500) BP: (78-164)/(57-153) 82/61 (08/25 0500) SpO2:  [84 %-93 %] 89 % (08/25 0500)    Weight change: Filed Weights   09/10/2020 0906  Weight: 113.4 kg    Intake/Output:   Intake/Output Summary (Last 24 hours) at 09/12/2020 0908 Last data filed at 08/20/2020 1021 Gross per 24 hour  Intake 1000 ml  Output --  Net 1000 ml      Physical Exam    General:  sleeping appears comfortable [place] stokes breathing  HEENT: Normal Neck: Supple. JVP to jaw . Carotids 2+ bilat; no bruits. No lymphadenopathy or thyromegaly appreciated. Cor: PMI nondisplaced. Regular rate & rhythm. No rubs, gallops or murmurs. Lungs: decreased BS at the bases bilaterally  Abdomen: Soft, nontender, nondistended. No hepatosplenomegaly. No bruits or masses. Good bowel sounds. Extremities: No cyanosis, clubbing, rash,  3+ edema to thighs  Neuro: lethargic   Telemetry   N/A   EKG    N/A   Labs    CBC Recent Labs    09/02/2020 0855 09/16/2020 0910  WBC  --  11.4*  HGB 13.9 12.4*  HCT 41.0 42.5  MCV  --  91.0  PLT  --  889   Basic Metabolic Panel Recent Labs    08/22/2020 0855 09/02/2020 0910  NA 129* 127*  K 5.2* 5.4*  CL 97* 100  CO2  --  16*  GLUCOSE 89 94  BUN 84* 88*  CREATININE 4.60* 4.37*  CALCIUM  --  8.1*   Liver Function Tests Recent Labs    08/31/2020 0910  AST 38  ALT 40  ALKPHOS 114   BILITOT 2.2*  PROT 5.6*  ALBUMIN 3.0*   No results for input(s): LIPASE, AMYLASE in the last 72 hours. Cardiac Enzymes No results for input(s): CKTOTAL, CKMB, CKMBINDEX, TROPONINI in the last 72 hours.  BNP: BNP (last 3 results) No results for input(s): BNP in the last 8760 hours.  ProBNP (last 3 results) No results for input(s): PROBNP in the last 8760 hours.   D-Dimer No results for input(s): DDIMER in the last 72 hours. Hemoglobin A1C No results for input(s): HGBA1C in the last 72 hours. Fasting Lipid Panel No results for input(s): CHOL, HDL, LDLCALC, TRIG, CHOLHDL, LDLDIRECT in the last 72 hours. Thyroid Function Tests No results for input(s): TSH, T4TOTAL, T3FREE, THYROIDAB in the last 72 hours.  Invalid input(s): FREET3  Other results:   Imaging    CT HEAD WO CONTRAST (5MM)  Result Date: 09/01/2020 CLINICAL DATA:  Fall, on blood thinners, recently discharged from hospital for multiple falls, head trauma EXAM: CT HEAD WITHOUT CONTRAST TECHNIQUE: Contiguous axial images were obtained from the base of the skull through the vertex without intravenous contrast. Sagittal and coronal MPR images reconstructed from axial data set. COMPARISON:  05/21/2019 FINDINGS: Brain: Beam hardening/motion artifacts in the frontal regions bilaterally. Generalized atrophy. Normal ventricular morphology. No  midline shift or mass effect. Minimal small vessel chronic ischemic changes of deep cerebral white matter. No intracranial hemorrhage, mass lesion, or evidence of acute infarction. No extra-axial fluid collections. Vascular: Atherosclerotic calcification of internal carotid arteries bilaterally at skull base. Skull: Small RIGHT frontal scalp hematoma.  No calvarial fractures. Sinuses/Orbits: Clear Other: N/A IMPRESSION: Atrophy with minimal small vessel chronic ischemic changes of deep cerebral white matter. No acute intracranial abnormalities. Small RIGHT frontal scalp hematoma. Electronically  Signed   By: Lavonia Dana M.D.   On: 08/23/2020 09:31   CT Cervical Spine Wo Contrast  Result Date: 08/24/2020 CLINICAL DATA:  Fall, on blood thinners EXAM: CT CERVICAL SPINE WITHOUT CONTRAST TECHNIQUE: Multidetector CT imaging of the cervical spine was performed without intravenous contrast. Multiplanar CT image reconstructions were also generated. Examination degraded by motion artifacts. COMPARISON:  None FINDINGS: Alignment: Minimal retrolisthesis at C5-C6. Remaining alignments normal. Skull base and vertebrae: Osseous mineralization normal. Skull base intact. Vertebral body heights maintained. Multilevel degenerative disc and facet disease changes of cervical spine. No fracture, subluxation, or bone destruction. Significant narrowing of LEFT C5-C6 neural foramen by retrolisthesis, uncovertebral hypertrophy and facet degenerative changes. Soft tissues and spinal canal: Prevertebral soft tissues normal thickness. Extensive atherosclerotic calcifications at the carotid bifurcations. Disc levels:  No specific abnormalities. Upper chest: LEFT pleural effusion.  Lung apices otherwise clear. Other: N/A IMPRESSION: Significant degenerative disc and facet disease changes of the cervical spine as above. No acute cervical spine abnormalities. LEFT pleural effusion. Significant atherosclerotic calcifications at the carotid bifurcations. Electronically Signed   By: Lavonia Dana M.D.   On: 09/05/2020 09:35   CT CHEST ABDOMEN PELVIS WO CONTRAST  Result Date: 09/04/2020 CLINICAL DATA:  Abdominal trauma. Recently discharged from hospital. Patient fell at nursing home and has bilateral lower quadrant bruises as well as bruising to arms and legs. EXAM: CT CHEST, ABDOMEN AND PELVIS WITHOUT CONTRAST TECHNIQUE: Multidetector CT imaging of the chest, abdomen and pelvis was performed following the standard protocol without IV contrast. COMPARISON:  None. FINDINGS: Of note, patient respiratory motion artifact degrades image  quality. CT CHEST FINDINGS Cardiovascular: Cardiomegaly, stable compared to chest radiograph 12/17/2018. Left chest wall pacemaker with single lead within the right ventricle. Multivessel severe coronary artery atherosclerosis. Mild calcifications of the aortic arch. Mediastinum/Nodes: No enlarged mediastinal, hilar, or axillary lymph nodes. Thyroid gland, trachea, and esophagus demonstrate no significant findings. Lungs/Pleura: Moderate layering left pleural effusion with compressive subsegmental atelectasis in the left lower lobe. Subsegmental dependent atelectasis in the left upper lobe and right lower lobe. Punctate calcified granuloma in the right lower lobe (series 4, image 79). Musculoskeletal: Mild dextroscoliosis of the thoracic spine with flowing anterior osteophytes, consistent with diffuse idiopathic skeletal hyperostosis. Multilevel mild degenerative disc disease throughout the mid and distal thoracic spine. No fracture. CT ABDOMEN PELVIS FINDINGS Hepatobiliary: No focal liver abnormality is seen. Layering mildly hyperdense material in the gallbladder body, likely gallbladder sludge. The gallbladder is not distended. No gallbladder wall thickening or biliary dilatation. Pancreas: Diffuse moderate parenchymal atrophy and fatty infiltration. No dilatation of the main pancreatic duct. No adjacent inflammatory changes. Spleen: Normal in size without focal abnormality. Adrenals/Urinary Tract: Adrenal glands are unremarkable. Mild atrophy of the kidneys. No renal calculi, focal lesion, or hydronephrosis. Bladder is unremarkable. Stomach/Bowel: Stomach is within normal limits. Appendix appears normal. Incidentally noted 2.5 cm lipoma in the transverse portion of the duodenum (series 5, image 43; series 3, image 81). No evidence of bowel wall thickening, distention, or inflammatory changes. Vascular/Lymphatic:  Severe calcific atherosclerosis is noted at the origin of the SMA (series 3, image 67; series 5, image  50). Severe calcific atherosclerosis at the origin of the left renal artery (series 3, image 71). 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 Scattered mild-to-moderate calcific atherosclerosis of the bilateral common femoral arteries. No abdominal aortic aneurysm. No enlarged abdominal or pelvic lymph nodes. Reproductive: Prostate is unremarkable. Other: Trace simple ascites is noted within the hepatorenal recess. Fat stranding is noted within the lateral abdominal wall bilaterally, left-greater-than-right. No hematoma of the abdominal wall musculature. No subcutaneous fluid collection. Musculoskeletal: Levoscoliosis of the lumbar spine centered at the L3 vertebral body. Multilevel moderate-to-severe degenerative disc disease at the L2 through S1 levels with prominent right marginal osteophytes. Advanced degenerative changes of the sacroiliac joints with vacuum phenomenon. No fracture. A 1.7 x 1.4 cm lytic lesion in right pubic body has coarse trabecula (series 3, image 121, series 5, image 43). IMPRESSION: CT Chest: 1. Moderate layering left pleural effusion with subsegmental compressive left lower lobe atelectasis. 2. Stable cardiomegaly. 3. No fracture. CT Abdomen/Pelvis: 1. No fracture. No findings to suggest intra-abdominal trauma on this unenhanced exam. 2. Fat stranding within the lateral abdominal wall bilaterally, left greater than right, consistent with clinical history of lower quadrant bruising. No subcutaneous fluid collection or muscular hematoma in the visualized abdominal wall. 3. A 1.7 cm lytic lesion in the right pubic body with coarse trabecula is favored to represent a hemangioma. However multiple myeloma is also in the differential. Consider MRI on a non-emergent basis for further characterization. 4. Aortic atherosclerosis with severe calcific atherosclerosis at the origins of the SMA and left renal artery. Aortic Atherosclerosis (ICD10-I70.0). Electronically  Signed   By: Ileana Roup M.D.   On: 09/10/2020 10:13     Medications:     Scheduled Medications:  sodium chloride flush  3 mL Intravenous Q12H    Infusions:  sodium chloride     morphine 2 mg/hr (08/29/2020 2006)    PRN Medications: sodium chloride, acetaminophen **OR** acetaminophen, antiseptic oral rinse, diphenhydrAMINE, glycopyrrolate **OR** glycopyrrolate **OR** glycopyrrolate, haloperidol **OR** haloperidol **OR** haloperidol lactate, HYDROmorphone (DILAUDID) injection, LORazepam **OR** LORazepam **OR** LORazepam, morphine, ondansetron **OR** ondansetron (ZOFRAN) IV, polyvinyl alcohol, sodium chloride flush   Assessment/Plan    1. 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 - Echo (8/22): EF<20%, LV severely decreased, Grade III DD, moderate down RV, severe bi-atrial dilation, moderate MR/TR, moderate left pleural effusion, mild AS with calcification. - recent functional decline and multiple hospitalizations for a/c CHF. NYHA Class IV.  - GDMT recently limited by hypotension and AKI. - Now w/ profound shock, CO2 16, Lactic Acid 2.5. Worsening renal failure w/ SCr now >4.0  - Not a candidate for advanced therapies  - Now full comfort, awaiting residential hospice bed - Palliative care managing comfort drips. Currently appears comfortable on exam  - PRN IV Lasix for comfort - ICD has been deactivated    Length of Stay: 1  Brittainy Simmons, PA-C  09/12/2020, 9:08 AM  Advanced Heart Failure Team Pager [phone] (M-F; 7a - 5p)  Please contact [facility] for night-coverage after hours (5p -7a ) and weekends on [email or web address]  Patient seen and examined with the above-signed Advanced Practice Provider and/or Housestaff. I personally reviewed laboratory data, imaging studies and relevant notes. I independently examined  the patient and  formulated the important aspects of the plan. I have edited the note to reflect any of my changes or salient points. I have personally discussed the plan with the patient and/or family.  Patient agitated and pulling at his beard, nasal cannula and IV. Tremulous and wheezing on exam. No urine output.   Family says he had been comfortable up until 5am and increasingly uncomfortable since.   Morphine has been stopped and switched to dilaudid. Ativan added  General/Neuro: Lying in bed agitated pulling at beard, nasal cannula and IVs. Will arouse at times HEENT: normal + scalp hematoma Neck: supple. JVP to ear  Carotids 2+ bilat; no bruits. No lymphadenopathy or thryomegaly appreciated. Cor: PMI nondisplaced. Regular rate & rhythm. +s3 Lungs: audible wheezing Abdomen: soft, nontender, + distended. No hepatosplenomegaly. No bruits or masses. Good bowel sounds. Extremities: no cyanosis, clubbing, rash, 3+ edema  He remains agitated and uncomfortable with audible wheezing.   Agree with switch to dilaudid and ativan. I will give a bolus of dilaudid and increase drip rate to ensure comfort. Remove nasal cannula  Glori Bickers, MD  11:15 AM

## 2020-09-12 NOTE — Social Work (Signed)
CSW acknowledges TOC consult. Per [facility] notes on 09/05/2020 pt wishes to return home with hospice support. [facility] did not have a bed for pt and will continue to monitor.   CSW will touch base with pt and family for continued planning.   Emeterio Reeve, [place] Clinical Social Worker [phone]

## 2020-09-13 DIAGNOSIS — N189 Chronic kidney disease, unspecified: Secondary | ICD-10-CM | POA: Diagnosis not present

## 2020-09-13 DIAGNOSIS — N179 Acute kidney failure, unspecified: Secondary | ICD-10-CM | POA: Diagnosis not present

## 2020-09-13 DIAGNOSIS — Z4502 Encounter for adjustment and management of automatic implantable cardiac defibrillator: Secondary | ICD-10-CM | POA: Diagnosis not present

## 2020-09-13 DIAGNOSIS — Z66 Do not resuscitate: Secondary | ICD-10-CM

## 2020-09-13 DIAGNOSIS — R0989 Other specified symptoms and signs involving the circulatory and respiratory systems: Secondary | ICD-10-CM

## 2020-09-13 DIAGNOSIS — Z789 Other specified health status: Secondary | ICD-10-CM

## 2020-09-13 DIAGNOSIS — I5043 Acute on chronic combined systolic (congestive) and diastolic (congestive) heart failure: Secondary | ICD-10-CM | POA: Diagnosis not present

## 2020-09-13 DIAGNOSIS — R570 Cardiogenic shock: Secondary | ICD-10-CM | POA: Diagnosis not present

## 2020-09-13 MED ORDER — FLUCONAZOLE 100MG IVPB: 100.0000 mg | INTRAVENOUS | Status: DC

## 2020-09-13 MED ORDER — GLYCOPYRROLATE 0.2 MG/ML IJ SOLN
0.4000 mg | INTRAMUSCULAR | Status: DC
  Administered 2020-09-13: 0.4 mg via INTRAVENOUS
  Filled 2020-09-13: qty 2

## 2020-09-13 MED ORDER — POLYETHYLENE GLYCOL 3350 17 G PO PACK: 17.0000 g | PACK | Freq: Two times a day (BID) | ORAL | Status: DC

## 2020-09-13 MED ORDER — FLUCONAZOLE IN SODIUM CHLORIDE 200-0.9 MG/100ML-% IV SOLN: 200.0000 mg | Freq: Once | INTRAVENOUS | Status: DC

## 2020-09-13 MED ORDER — HYDROMORPHONE BOLUS VIA INFUSION
2.0000 mg | Freq: Once | INTRAVENOUS | Status: AC
  Administered 2020-09-13: 2 mg via INTRAVENOUS
  Filled 2020-09-13: qty 2

## 2020-09-16 ENCOUNTER — Encounter (HOSPITAL_COMMUNITY): Payer: Self-pay

## 2020-09-19 NOTE — Care Management Important Message (Signed)
Important Message  Patient Details  Name: Leonard [name] MRN: [id number] Date of Birth: 12-Sep-1945   Medicare Important Message Given:  Yes     Lariah Fleer Montine Circle 10/10/20, 3:40 PM

## 2020-09-19 NOTE — Progress Notes (Signed)
[facility] PROGRESS NOTE    Progress Note  Leonard [name]  OYD:[id number] DOB: 12-30-1945 DOA: 08/25/2020 PCP: Girtha Rm, NP-C     Brief Narrative:   Leonard [name] is an 75 y.o. male past medical history of combined systolic and diastolic heart failure, with ischemic cardiomyopathy with an EF of 67% grade 2 diastolic heart failure, is also history of a posterior STEMI with 2 overlapping DES stent to the RCA last cath in 2017, with a single-chamber ICD placed in 2017 for VT morbid obesity recently admitted on last month for acute kidney injury after fluid resuscitation and his creatinine returning to baseline he was was discharged on diuretic therapy followed up with the advanced heart failure clinic today and was found to be in worsening new acute kidney injury, bilateral pleural effusion hypotensive.  Cardiology spoke to him about palliative care and comfort measures and what does not mean in the setting of advanced heart failure with worsening renal function.  And she agreed to move towards comfort care he was made a DNR/DNI. He was sent to the ED but unfortunately we cannot place him at a residential hospice facility so we will admit him to the hospital.     Assessment/Plan:   Acute combined systolic and diastolic heart failure/  Biventricular failure (HCC)/cardiogenic shock: Agonal breathing, continue IV Dilaudid drip.  Agree with increasing his IV Dilaudid. He probably has hours we will not transition him to a residential hospice facility.  Acute on chronic combined systolic and diastolic CHF (congestive heart failure) (HCC) Acute kidney injury superimposed on CKD ([place]) Encounter for disabling of implantable cardioverter-defibrillator (ICD) in patient receiving comfort measures only Cardiorenal syndrome with renal failure Pressure injury of skin Noted all medical problems, continue only medications that are required for comfort.    DVT prophylaxis:  none Family Communication:daughter Status is: Inpatient  Remains inpatient appropriate because:Hemodynamically unstable  Dispo: The patient is from: Home              Anticipated d/c is to:  Residential hospice facility              Patient currently is not medically stable to d/c.   Difficult to place patient No   Code Status:     Code Status Orders  (From admission, onward)           Start     Ordered   09/18/2020 1857  Do not attempt resuscitation (DNR)  Continuous       Question Answer Comment  In the event of cardiac or respiratory ARREST Do not call a "code blue"   In the event of cardiac or respiratory ARREST Do not perform Intubation, CPR, defibrillation or ACLS   In the event of cardiac or respiratory ARREST Use medication by any route, position, wound care, and other measures to relive pain and suffering. May use oxygen, suction and manual treatment of airway obstruction as needed for comfort.      08/21/2020 1856           Code Status History     Date Active Date Inactive Code Status Order ID Comments User Context   08/30/2020 1341 09/03/2020 1856 DNR [id number]  Isla Pence, MD ED   09/10/2020 1100 09/16/2020 1340 DNR [id number]  Isla Pence, MD ED         IV Access:   Peripheral IV   Procedures and diagnostic studies:   No results found.   Medical Consultants:   None.  Subjective:    Leonard [name] gurgling  Objective:    Vitals:   08/27/2020 1223 09/10/2020 1851 09/12/20 0500 Sep 26, 2020 0529  BP: (!) 78/57 (!) 164/153 (!) 82/61 (!) 69/41  Pulse: (!) 45 98 60 (!) 43  Resp: 18 18 16 15   Temp:  97.6 F (36.4 C) 97.6 F (36.4 C) 97.9 F (36.6 C)  TempSrc:  Oral Tympanic Oral  SpO2: (!) 86% (!) 84% (!) 89% 99%  Weight:      Height:       SpO2: 99 %   Intake/Output Summary (Last 24 hours) at 09-26-20 0937 Last data filed at 2020/09/26 0900 Gross per 24 hour  Intake 46.34 ml  Output 0 ml  Net 46.34 ml   Filed Weights    08/24/2020 0906  Weight: 113.4 kg    Exam: General exam: In no acute distress. Respiratory system: Good air movement and clear to auscultation. Cardiovascular system: S1 & S2 heard, RRR. No JVD. Gastrointestinal system: Abdomen is nondistended, soft and nontender.  Extremities: No pedal edema. Skin: No rashes, lesions or ulcers  Data Reviewed:    Labs: Basic Metabolic Panel: Recent Labs  Lab 09/15/2020 0855 08/29/2020 0910  NA 129* 127*  K 5.2* 5.4*  CL 97* 100  CO2  --  16*  GLUCOSE 89 94  BUN 84* 88*  CREATININE 4.60* 4.37*  CALCIUM  --  8.1*    GFR Estimated Creatinine Clearance: 18.7 mL/min (A) (by C-G formula based on SCr of 4.37 mg/dL (H)). Liver Function Tests: Recent Labs  Lab 09/08/2020 0910  AST 38  ALT 40  ALKPHOS 114  BILITOT 2.2*  PROT 5.6*  ALBUMIN 3.0*    No results for input(s): LIPASE, AMYLASE in the last 168 hours. No results for input(s): AMMONIA in the last 168 hours. Coagulation profile Recent Labs  Lab 08/31/2020 0910  INR 1.3*    COVID-19 Labs  No results for input(s): DDIMER, FERRITIN, LDH, CRP in the last 72 hours.  Lab Results  Component Value Date   [place] NEGATIVE 08/28/2020    CBC: Recent Labs  Lab 08/26/2020 0855 09/06/2020 0910  WBC  --  11.4*  HGB 13.9 12.4*  HCT 41.0 42.5  MCV  --  91.0  PLT  --  240    Cardiac Enzymes: No results for input(s): CKTOTAL, CKMB, CKMBINDEX, TROPONINI in the last 168 hours. BNP (last 3 results) No results for input(s): PROBNP in the last 8760 hours. CBG: No results for input(s): GLUCAP in the last 168 hours. D-Dimer: No results for input(s): DDIMER in the last 72 hours. Hgb A1c: No results for input(s): HGBA1C in the last 72 hours. Lipid Profile: No results for input(s): CHOL, HDL, LDLCALC, TRIG, CHOLHDL, LDLDIRECT in the last 72 hours. Thyroid function studies: No results for input(s): TSH, T4TOTAL, T3FREE, THYROIDAB in the last 72 hours.  Invalid input(s): FREET3 Anemia  work up: No results for input(s): VITAMINB12, FOLATE, FERRITIN, TIBC, IRON, RETICCTPCT in the last 72 hours. Sepsis Labs: Recent Labs  Lab 08/30/2020 0845 09/01/2020 0910 09/06/2020 1216  WBC  --  11.4*  --   LATICACIDVEN 2.5*  --  1.7    Microbiology Recent Results (from the past 240 hour(s))  Resp Panel by RT-PCR (Flu A&B, Covid) Nasopharyngeal Swab     Status: None   Collection Time: 09/03/2020  8:45 AM   Specimen: Nasopharyngeal Swab; Nasopharyngeal(NP) swabs in vial transport medium  Result Value Ref Range Status   SARS Coronavirus 2 by  RT PCR NEGATIVE NEGATIVE Final    Comment: (NOTE) SARS-CoV-2 target nucleic acids are NOT DETECTED.  The SARS-CoV-2 RNA is generally detectable in upper respiratory specimens during the acute phase of infection. The lowest concentration of SARS-CoV-2 viral copies this assay can detect is 138 copies/mL. A negative result does not preclude SARS-Cov-2 infection and should not be used as the sole basis for treatment or other patient management decisions. A negative result may occur with  improper specimen collection/handling, submission of specimen other than nasopharyngeal swab, presence of viral mutation(s) within the areas targeted by this assay, and inadequate number of viral copies(<138 copies/mL). A negative result must be combined with clinical observations, patient history, and epidemiological information. The expected result is Negative.  Fact Sheet for Patients:  EntrepreneurPulse.com.au  Fact Sheet for Healthcare Providers:  IncredibleEmployment.be  This test is no t yet approved or cleared by the [place] FDA and  has been authorized for detection and/or diagnosis of SARS-CoV-2 by FDA under an Emergency Use Authorization (EUA). This EUA will remain  in effect (meaning this test can be used) for the duration of the COVID-19 declaration under Section 564(b)(1) of the Act, 21 U.S.C.section  360bbb-3(b)(1), unless the authorization is terminated  or revoked sooner.       Influenza A by PCR NEGATIVE NEGATIVE Final   Influenza B by PCR NEGATIVE NEGATIVE Final    Comment: (NOTE) The Xpert Xpress SARS-CoV-2/FLU/RSV plus assay is intended as an aid in the diagnosis of influenza from Nasopharyngeal swab specimens and should not be used as a sole basis for treatment. Nasal washings and aspirates are unacceptable for Xpert Xpress SARS-CoV-2/FLU/RSV testing.  Fact Sheet for Patients: EntrepreneurPulse.com.au  Fact Sheet for Healthcare Providers: IncredibleEmployment.be  This test is not yet approved or cleared by the [place] FDA and has been authorized for detection and/or diagnosis of SARS-CoV-2 by FDA under an Emergency Use Authorization (EUA). This EUA will remain in effect (meaning this test can be used) for the duration of the COVID-19 declaration under Section 564(b)(1) of the Act, 21 U.S.C. section 360bbb-3(b)(1), unless the authorization is terminated or revoked.  Performed at [facility], [place] [address], [place], [place] [address]      Medications:    sodium chloride flush  3 mL Intravenous Q12H   Continuous Infusions:  sodium chloride     HYDROmorphone 2 mg/hr (October 04, 2020 0850)      LOS: 2 days   Charlynne Cousins  [facility]  04-Oct-2020, 9:37 AM

## 2020-09-19 NOTE — Progress Notes (Signed)
Advanced Heart Failure Rounding Note  PCP-Cardiologist: None   Subjective:    Remains on dilaudid drip. Has periods of Cheyne Stokes breathing with rhonchi and gurgling.   Arouses somewhat at times.   Objective:   Weight Range: 113.4 kg Body mass index is 32.98 kg/m.   Vital Signs:   Temp:  [97.9 F (36.6 C)] 97.9 F (36.6 C) (08/26 0529) Pulse Rate:  [43] 43 (08/26 0529) Resp:  [15] 15 (08/26 0529) BP: (69)/(41) 69/41 (08/26 0529) SpO2:  [99 %] 99 % (08/26 0529)    Weight change: Filed Weights   09/18/2020 0906  Weight: 113.4 kg    Intake/Output:   Intake/Output Summary (Last 24 hours) at 12-Oct-2020 0939 Last data filed at 2020/10/12 0900 Gross per 24 hour  Intake 46.34 ml  Output 0 ml  Net 46.34 ml       Physical Exam    General:  terminally ill with Cheyne-stokes breathing at times audible gurgling HEENT: normal Neck: supple. JVP to ear Carotids 2+ bilat; no bruits. No lymphadenopathy or thryomegaly appreciated. Cor: PMI nondisplaced. Regular rate & rhythm. 2/56 SEM Lungs: rhonchi and gurgling Abdomen: soft, nontender, nondistended. No hepatosplenomegaly. No bruits or masses. Good bowel sounds. Extremities: no cyanosis, clubbing, rash, 3+ edema Neuro: will arouse at times not following commands    Telemetry   N/A   EKG    N/A   Labs    CBC Recent Labs    09/08/2020 0855 09/16/2020 0910  WBC  --  11.4*  HGB 13.9 12.4*  HCT 41.0 42.5  MCV  --  91.0  PLT  --  932    Basic Metabolic Panel Recent Labs    09/10/2020 0855  0910  NA 129* 127*  K 5.2* 5.4*  CL 97* 100  CO2  --  16*  GLUCOSE 89 94  BUN 84* 88*  CREATININE 4.60* 4.37*  CALCIUM  --  8.1*    Liver Function Tests Recent Labs    08/31/2020 0910  AST 38  ALT 40  ALKPHOS 114  BILITOT 2.2*  PROT 5.6*  ALBUMIN 3.0*    No results for input(s): LIPASE, AMYLASE in the last 72 hours. Cardiac Enzymes No results for input(s): CKTOTAL, CKMB, CKMBINDEX, TROPONINI in  the last 72 hours.  BNP: BNP (last 3 results) No results for input(s): BNP in the last 8760 hours.  ProBNP (last 3 results) No results for input(s): PROBNP in the last 8760 hours.   D-Dimer No results for input(s): DDIMER in the last 72 hours. Hemoglobin A1C No results for input(s): HGBA1C in the last 72 hours. Fasting Lipid Panel No results for input(s): CHOL, HDL, LDLCALC, TRIG, CHOLHDL, LDLDIRECT in the last 72 hours. Thyroid Function Tests No results for input(s): TSH, T4TOTAL, T3FREE, THYROIDAB in the last 72 hours.  Invalid input(s): FREET3  Other results:   Imaging    No results found.   Medications:     Scheduled Medications:  sodium chloride flush  3 mL Intravenous Q12H    Infusions:  sodium chloride     HYDROmorphone 2 mg/hr (2020-10-12 0850)    PRN Medications: sodium chloride, acetaminophen **OR** acetaminophen, antiseptic oral rinse, diphenhydrAMINE, glycopyrrolate **OR** glycopyrrolate **OR** glycopyrrolate, haloperidol **OR** haloperidol **OR** haloperidol lactate, LORazepam **OR** LORazepam **OR** LORazepam, ondansetron **OR** ondansetron (ZOFRAN) IV, polyvinyl alcohol, sodium chloride flush   Assessment/Plan    1. 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 - Echo (8/22): EF<20%, LV severely decreased, Grade III DD, moderate down RV, severe bi-atrial dilation, moderate MR/TR, moderate left pleural effusion, mild AS with calcification. - he is actively dying. On dilaudid drip for comfort.  - has audible rhonchi and gurgling on exam - give 2mg  dilaudid bolus. increase dilaudid gtt to 3. Give robinole now - need to ensure comfort  - ICD is off   Length of Stay: 2  Glori Bickers, MD  09/23/20, 9:39 AM  Advanced Heart Failure Team Pager [phone] (M-F; 7a - 5p)  Please contact [facility] for  night-coverage after hours (5p -7a ) and weekends on [email or web address]  Patient seen and examined with the above-signed Advanced Practice Provider and/or Housestaff. I personally reviewed laboratory data, imaging studies and relevant notes. I independently examined the patient and formulated the important aspects of the plan. I have edited the note to reflect any of my changes or salient points. I have personally discussed the plan with the patient and/or family.

## 2020-09-19 NOTE — Discharge Summary (Signed)
Death Summary  Leonard [name] JKK:[id number] DOB: December 18, 1945 DOA: 02-Oct-2020  PCP: Girtha Rm, NP-C  Admit date: 10/02/2020 Date of Death: 10/04/20 Time of Death: 13:03 Notification: Girtha Rm, NP-C notified of death of 10/08/2020   History of present illness:  Leonard [name] is a 75 y.o. 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  Cardiology spoke to him about palliative care and comfort measures and what does not mean in the setting of advanced heart failure with worsening renal function.  And she agreed to move towards comfort care he was made a DNR/DNI. He was sent to the ED but unfortunately we cannot place him at a residential hospice facility so we will admit him to the hospital.  Final Diagnoses:  Acute combined systolic and diastolic heart failure with biventricular failure/profound cardiogenic shock: On the day of admission he is on the advanced heart failure team and he was deemed not a candidate for advanced therapy he was in cardiogenic shock so was discussed with the family to proceed with comfort care which they agreed. All the medications that are not related to comfort were discontinued and no further labs were drawn. He was admitted to the hospital started on IV Dilaudid and he passed away within 48 hours. His family was at bedside and comfortable with decision.  He passed away on  July 29, 2020.   The results of significant diagnostics from this hospitalization (including imaging, microbiology, ancillary and laboratory) are listed below for reference.    Significant Diagnostic Studies: CT HEAD WO CONTRAST (5MM)  Result Date: 10-02-2020 CLINICAL DATA:  Fall, on blood thinners, recently discharged from hospital for multiple falls, head trauma EXAM: CT HEAD WITHOUT CONTRAST TECHNIQUE: Contiguous axial images were obtained from the base of the skull through the vertex without intravenous contrast. Sagittal and coronal MPR images reconstructed from axial data set. COMPARISON:  05/21/2019 FINDINGS: Brain: Beam hardening/motion artifacts in the frontal regions bilaterally. Generalized atrophy. Normal ventricular morphology. No midline shift or mass effect. Minimal small vessel chronic ischemic changes of deep cerebral white matter. No intracranial hemorrhage, mass lesion, or evidence of acute infarction. No extra-axial fluid collections. Vascular: Atherosclerotic calcification of internal carotid arteries bilaterally at skull base. Skull: Small RIGHT frontal scalp hematoma.  No calvarial fractures. Sinuses/Orbits: Clear Other: N/A IMPRESSION: Atrophy with minimal small vessel chronic ischemic changes of deep cerebral white matter. No acute intracranial abnormalities. Small RIGHT frontal scalp hematoma. Electronically Signed   By: Lavonia Dana M.D.   On: October 02, 2020 09:31   CT Cervical Spine Wo Contrast  Result Date: 10-02-2020 CLINICAL DATA:  Fall, on blood thinners EXAM: CT CERVICAL SPINE WITHOUT CONTRAST TECHNIQUE: Multidetector CT imaging of the cervical spine was performed without intravenous contrast. Multiplanar CT image reconstructions were also generated. Examination degraded by motion artifacts. COMPARISON:  None FINDINGS: Alignment: Minimal retrolisthesis at C5-C6. Remaining alignments normal. Skull base and vertebrae: Osseous mineralization normal. Skull base intact. Vertebral body  heights maintained. Multilevel degenerative disc and facet disease changes of cervical spine. No fracture, subluxation, or bone destruction. Significant narrowing of LEFT C5-C6 neural foramen by retrolisthesis, uncovertebral hypertrophy and facet degenerative changes. Soft tissues and spinal canal: Prevertebral soft tissues normal thickness. Extensive atherosclerotic calcifications at the carotid bifurcations. Disc levels:  No specific abnormalities. Upper chest: LEFT pleural effusion.  Lung apices otherwise clear. Other: N/A IMPRESSION: Significant degenerative disc and facet disease changes of the cervical spine as above. No acute cervical spine abnormalities. LEFT pleural effusion. Significant atherosclerotic calcifications at the carotid bifurcations. Electronically Signed   By: Lavonia Dana M.D.   On: 09/10/2020 09:35   US RENAL  Result Date: 08/31/2020 CLINICAL DATA:  Acute kidney injury. EXAM: RENAL / URINARY TRACT ULTRASOUND COMPLETE COMPARISON:  None. FINDINGS: Right Kidney: Renal measurements: 9.3 x 5.1 x 4.1 cm = volume: 100.1 mL. Echogenicity within normal limits. No mass or hydronephrosis visualized. Left Kidney: Renal measurements: 9.2 x 4.6 x 3.4 cm = volume: 74 mL. Echogenicity within normal limits. No mass or hydronephrosis visualized. Bladder: Bladder is decompressed by Foley catheter. Other: None. IMPRESSION: No acute findings.  No hydronephrosis. Electronically Signed   By: Franki Cabot M.D.   On: 08/31/2020 17:01   DG Pelvis Portable  Result Date: 09/17/2020 CLINICAL DATA:  Fall, on blood thinners EXAM: PORTABLE PELVIS 1-2 VIEWS COMPARISON:  Portable exam 0847 hours without priors for comparison FINDINGS: Mild osseous demineralization. Hip and SI joint spaces preserved. No fracture, dislocation, or bone destruction. Degenerative disc and facet disease changes at visualized lower lumbar spine. IMPRESSION: No acute pelvic abnormalities. Electronically Signed   By: Lavonia Dana M.D.   On:  09/08/2020 09:24   DG Chest Portable 1 View  Result Date: 08/29/2020 CLINICAL DATA:  Fall, on blood thinners EXAM: PORTABLE CHEST 1 VIEW COMPARISON:  Portable exam 3825 hours compared to 08/31/2020 FINDINGS: LEFT subclavian ICD with lead projecting over RIGHT ventricle. Enlargement of cardiac silhouette with slight pulmonary vascular congestion. Mediastinal contours normal with atherosclerotic calcification at aortic arch. LEFT pleural effusion and basilar atelectasis increased from previous exam. Remaining lungs clear. No pneumothorax or definite fractures. Scattered endplate spur formation thoracic spine. IMPRESSION: Enlargement of cardiac silhouette with pulmonary vascular congestion post ICD. Increased LEFT pleural effusion and basilar atelectasis. Aortic Atherosclerosis (ICD10-I70.0). Electronically Signed   By: Lavonia Dana M.D.   On: 08/28/2020 09:23   DG Chest Portable 1 View  Result Date: 08/31/2020 CLINICAL DATA:  Hypotension and fall. EXAM: PORTABLE CHEST 1 VIEW COMPARISON:  Chest radiograph dated 12/17/2018. FINDINGS: The heart is enlarged. Vascular calcifications are seen in the aortic arch. Mild left basilar atelectasis/airspace disease is noted. A small left pleural effusion may contribute. The right lung is clear without pleural effusion. There is no pneumothorax. A left subclavian approach cardiac device is redemonstrated. Degenerative changes are seen in the spine. IMPRESSION: Mild left basilar atelectasis/airspace disease. A small left pleural effusion may contribute. Aortic Atherosclerosis (ICD10-I70.0). Electronically Signed   By: Zerita Boers M.D.   On: 08/31/2020 13:58   DG Hand Complete Right  Result Date: 09/09/2020 CLINICAL DATA:  Fall, on blood thinners, tenderness everywhere, abrasion posterior RIGHT hand EXAM: RIGHT HAND - COMPLETE 3+ VIEW COMPARISON:  None FINDINGS: Osseous mineralization grossly normal. Degenerative changes at first [facility] joint, second and third MCP joints,  and radiocarpal joint. Soft tissue swelling overlying dorsum of hand at the level of the MCP joints. No acute fracture, dislocation, or bone destruction. IMPRESSION: Scattered degenerative changes as  above. No acute osseous abnormalities. Electronically Signed   By: Lavonia Dana M.D.   On: 08/20/2020 09:22   ECHOCARDIOGRAM COMPLETE  Result Date: 09/01/2020    ECHOCARDIOGRAM REPORT   Patient Name:   Leonard [name] Date of Exam: 09/01/2020 Medical Rec #:  [id number]           Height:       73.0 in Accession #:    [id number]          Weight:       225.0 lb Date of Birth:  [birth year]            BSA:          2.262 m Patient Age:    48 years            BP:           117/76 mmHg Patient Gender: M                   HR:           57 bpm. Exam Location:  Inpatient Procedure: 2D Echo Indications:    acute systolic chf  History:        Patient has prior history of Echocardiogram examinations, most                 recent 12/18/2018. Cardiomyopathy, CAD, Defibrillator, chronic                 kidney disease; Risk Factors:Hypertension and Dyslipidemia.  Sonographer:    Johny Chess RDCS Referring Phys: [id number] [place]  1. Global hypokinesis with akinesis of the inferior and inferolateral walls; overall sevrere LV dysfunction.  2. Left ventricular ejection fraction, by estimation, is <20%. The left ventricle has severely decreased function. The left ventricle demonstrates regional wall motion abnormalities (see scoring diagram/findings for description). The left ventricular internal cavity size was moderately dilated. There is mild left ventricular hypertrophy. Left ventricular diastolic parameters are consistent with Grade [name] diastolic dysfunction (restrictive).  3. Right ventricular systolic function is moderately reduced. The right ventricular size is normal. There is moderately elevated pulmonary artery systolic pressure.  4. Left atrial size was severely dilated.  5. Right atrial size was severely  dilated.  6. Moderate pleural effusion in the left lateral region.  7. The mitral valve is normal in structure. Moderate mitral valve regurgitation. No evidence of mitral stenosis.  8. Tricuspid valve regurgitation is moderate.  9. The aortic valve is tricuspid. Aortic valve regurgitation is mild. Mild to moderate aortic valve sclerosis/calcification is present, without any evidence of aortic stenosis. 10. The inferior vena cava is dilated in size with >50% respiratory variability, suggesting right atrial pressure of 8 mmHg. FINDINGS  Left Ventricle: Left ventricular ejection fraction, by estimation, is <20%. The left ventricle has severely decreased function. The left ventricle demonstrates regional wall motion abnormalities. The left ventricular internal cavity size was moderately dilated. There is mild left ventricular hypertrophy. Left ventricular diastolic parameters are consistent with Grade [name] diastolic dysfunction (restrictive). Right Ventricle: The right ventricular size is normal. Right ventricular systolic function is moderately reduced. There is moderately elevated pulmonary artery systolic pressure. The tricuspid regurgitant velocity is 3.16 m/s, and with an assumed right atrial pressure of 8 mmHg, the estimated right ventricular systolic pressure is 77.9 mmHg. Left Atrium: Left atrial size was severely dilated. Right Atrium: Right atrial size was severely dilated. Pericardium: There is no evidence of pericardial effusion. Mitral Valve: The mitral  valve is normal in structure. Moderate mitral valve regurgitation. No evidence of mitral valve stenosis. Tricuspid Valve: The tricuspid valve is normal in structure. Tricuspid valve regurgitation is moderate . No evidence of tricuspid stenosis. Aortic Valve: The aortic valve is tricuspid. Aortic valve regurgitation is mild. Mild to moderate aortic valve sclerosis/calcification is present, without any evidence of aortic stenosis. Pulmonic Valve: The pulmonic  valve was normal in structure. Pulmonic valve regurgitation is not visualized. No evidence of pulmonic stenosis. Aorta: The aortic root is normal in size and structure. Venous: The inferior vena cava is dilated in size with greater than 50% respiratory variability, suggesting right atrial pressure of 8 mmHg. IAS/Shunts: No atrial level shunt detected by color flow Doppler. Additional Comments: Global hypokinesis with akinesis of the inferior and inferolateral walls; overall sevrere LV dysfunction. A device lead is visualized. There is a moderate pleural effusion in the left lateral region.  LEFT VENTRICLE PLAX 2D LVIDd:         6.00 cm      Diastology LVIDs:         5.50 cm      LV e' medial: 3.92 cm/s LV PW:         1.20 cm LV IVS:        1.30 cm LVOT diam:     2.10 cm LV SV:         44 LV SV Index:   19 LVOT Area:     3.46 cm  LV Volumes (MOD) LV vol d, MOD A4C: 229.0 ml LV vol s, MOD A4C: 177.0 ml LV SV MOD A4C:     229.0 ml RIGHT VENTRICLE         IVC TAPSE (M-mode): 1.5 cm  IVC diam: 2.30 cm LEFT ATRIUM              Index       RIGHT ATRIUM           Index LA diam:        4.70 cm  2.08 cm/m  RA Area:     31.10 cm LA Vol (A2C):   162.0 ml 71.62 ml/m RA Volume:   120.00 ml 53.05 ml/m LA Vol (A4C):   138.0 ml 61.01 ml/m LA Biplane Vol: 156.0 ml 68.96 ml/m  AORTIC VALVE LVOT Vmax:   69.50 cm/s LVOT Vmean:  44.700 cm/s LVOT VTI:    0.126 m  AORTA Ao Root diam: 3.30 cm Ao Asc diam:  3.40 cm MR Peak grad:   77.4 mmHg   TRICUSPID VALVE MR Mean grad:   44.0 mmHg   TR Peak grad:   39.9 mmHg MR Vmax:        440.00 cm/s TR Vmax:        316.00 cm/s MR Vmean:       311.0 cm/s MR PISA:        1.01 cm    SHUNTS MR PISA Radius: 0.40 cm     Systemic VTI:  0.13 m                             Systemic Diam: 2.10 cm Kirk Ruths MD Electronically signed by Kirk Ruths MD Signature Date/Time: 09/01/2020/11:20:34 AM    Final    CT CHEST ABDOMEN PELVIS WO CONTRAST  Result Date: 09/05/2020 CLINICAL DATA:  Abdominal  trauma. Recently discharged from hospital. Patient fell at nursing home and has bilateral lower quadrant bruises as  well as bruising to arms and legs. EXAM: CT CHEST, ABDOMEN AND PELVIS WITHOUT CONTRAST TECHNIQUE: Multidetector CT imaging of the chest, abdomen and pelvis was performed following the standard protocol without IV contrast. COMPARISON:  None. FINDINGS: Of note, patient respiratory motion artifact degrades image quality. CT CHEST FINDINGS Cardiovascular: Cardiomegaly, stable compared to chest radiograph 12/17/2018. Left chest wall pacemaker with single lead within the right ventricle. Multivessel severe coronary artery atherosclerosis. Mild calcifications of the aortic arch. Mediastinum/Nodes: No enlarged mediastinal, hilar, or axillary lymph nodes. Thyroid gland, trachea, and esophagus demonstrate no significant findings. Lungs/Pleura: Moderate layering left pleural effusion with compressive subsegmental atelectasis in the left lower lobe. Subsegmental dependent atelectasis in the left upper lobe and right lower lobe. Punctate calcified granuloma in the right lower lobe (series 4, image 79). Musculoskeletal: Mild dextroscoliosis of the thoracic spine with flowing anterior osteophytes, consistent with diffuse idiopathic skeletal hyperostosis. Multilevel mild degenerative disc disease throughout the mid and distal thoracic spine. No fracture. CT ABDOMEN PELVIS FINDINGS Hepatobiliary: No focal liver abnormality is seen. Layering mildly hyperdense material in the gallbladder body, likely gallbladder sludge. The gallbladder is not distended. No gallbladder wall thickening or biliary dilatation. Pancreas: Diffuse moderate parenchymal atrophy and fatty infiltration. No dilatation of the main pancreatic duct. No adjacent inflammatory changes. Spleen: Normal in size without focal abnormality. Adrenals/Urinary Tract: Adrenal glands are unremarkable. Mild atrophy of the kidneys. No renal calculi, focal lesion, or  hydronephrosis. Bladder is unremarkable. Stomach/Bowel: Stomach is within normal limits. Appendix appears normal. Incidentally noted 2.5 cm lipoma in the transverse portion of the duodenum (series 5, image 43; series 3, image 81). No evidence of bowel wall thickening, distention, or inflammatory changes. Vascular/Lymphatic: Severe calcific atherosclerosis is noted at the origin of the SMA (series 3, image 67; series 5, image 50). Severe calcific atherosclerosis at the origin of the left renal artery (series 3, image 71). 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 Scattered mild-to-moderate calcific atherosclerosis of the bilateral common femoral arteries. No abdominal aortic aneurysm. No enlarged abdominal or pelvic lymph nodes. Reproductive: Prostate is unremarkable. Other: Trace simple ascites is noted within the hepatorenal recess. Fat stranding is noted within the lateral abdominal wall bilaterally, left-greater-than-right. No hematoma of the abdominal wall musculature. No subcutaneous fluid collection. Musculoskeletal: Levoscoliosis of the lumbar spine centered at the L3 vertebral body. Multilevel moderate-to-severe degenerative disc disease at the L2 through S1 levels with prominent right marginal osteophytes. Advanced degenerative changes of the sacroiliac joints with vacuum phenomenon. No fracture. A 1.7 x 1.4 cm lytic lesion in right pubic body has coarse trabecula (series 3, image 121, series 5, image 43). IMPRESSION: CT Chest: 1. Moderate layering left pleural effusion with subsegmental compressive left lower lobe atelectasis. 2. Stable cardiomegaly. 3. No fracture. CT Abdomen/Pelvis: 1. No fracture. No findings to suggest intra-abdominal trauma on this unenhanced exam. 2. Fat stranding within the lateral abdominal wall bilaterally, left greater than right, consistent with clinical history of lower quadrant bruising. No subcutaneous fluid collection or muscular hematoma in  the visualized abdominal wall. 3. A 1.7 cm lytic lesion in the right pubic body with coarse trabecula is favored to represent a hemangioma. However multiple myeloma is also in the differential. Consider MRI on a non-emergent basis for further characterization. 4. Aortic atherosclerosis with severe calcific atherosclerosis at the origins of the SMA and left renal artery. Aortic Atherosclerosis (ICD10-I70.0). Electronically Signed   By: Ileana Roup M.D.   On: 09/10/2020 10:13  Microbiology: Recent Results (from the past 240 hour(s))  Resp Panel by RT-PCR (Flu A&B, Covid) Nasopharyngeal Swab     Status: None   Collection Time: 09/08/2020  8:45 AM   Specimen: Nasopharyngeal Swab; Nasopharyngeal(NP) swabs in vial transport medium  Result Value Ref Range Status   SARS Coronavirus 2 by RT PCR NEGATIVE NEGATIVE Final    Comment: (NOTE) SARS-CoV-2 target nucleic acids are NOT DETECTED.  The SARS-CoV-2 RNA is generally detectable in upper respiratory specimens during the acute phase of infection. The lowest concentration of SARS-CoV-2 viral copies this assay can detect is 138 copies/mL. A negative result does not preclude SARS-Cov-2 infection and should not be used as the sole basis for treatment or other patient management decisions. A negative result may occur with  improper specimen collection/handling, submission of specimen other than nasopharyngeal swab, presence of viral mutation(s) within the areas targeted by this assay, and inadequate number of viral copies(<138 copies/mL). A negative result must be combined with clinical observations, patient history, and epidemiological information. The expected result is Negative.  Fact Sheet for Patients:  EntrepreneurPulse.com.au  Fact Sheet for Healthcare Providers:  IncredibleEmployment.be  This test is no t yet approved or cleared by the [place] FDA and  has been authorized for detection and/or  diagnosis of SARS-CoV-2 by FDA under an Emergency Use Authorization (EUA). This EUA will remain  in effect (meaning this test can be used) for the duration of the COVID-19 declaration under Section 564(b)(1) of the Act, 21 U.S.C.section 360bbb-3(b)(1), unless the authorization is terminated  or revoked sooner.       Influenza A by PCR NEGATIVE NEGATIVE Final   Influenza B by PCR NEGATIVE NEGATIVE Final    Comment: (NOTE) The Xpert Xpress SARS-CoV-2/FLU/RSV plus assay is intended as an aid in the diagnosis of influenza from Nasopharyngeal swab specimens and should not be used as a sole basis for treatment. Nasal washings and aspirates are unacceptable for Xpert Xpress SARS-CoV-2/FLU/RSV testing.  Fact Sheet for Patients: EntrepreneurPulse.com.au  Fact Sheet for Healthcare Providers: IncredibleEmployment.be  This test is not yet approved or cleared by the [place] FDA and has been authorized for detection and/or diagnosis of SARS-CoV-2 by FDA under an Emergency Use Authorization (EUA). This EUA will remain in effect (meaning this test can be used) for the duration of the COVID-19 declaration under Section 564(b)(1) of the Act, 21 U.S.C. section 360bbb-3(b)(1), unless the authorization is terminated or revoked.  Performed at [facility], [place] [address], [place], [place] [address]      Labs: Basic Metabolic Panel: Recent Labs  Lab 09/15/2020 0855  0910  NA 129* 127*  K 5.2* 5.4*  CL 97* 100  CO2  --  16*  GLUCOSE 89 94  BUN 84* 88*  CREATININE 4.60* 4.37*  CALCIUM  --  8.1*   Liver Function Tests: Recent Labs  Lab 09/10/2020 0910  AST 38  ALT 40  ALKPHOS 114  BILITOT 2.2*  PROT 5.6*  ALBUMIN 3.0*   No results for input(s): LIPASE, AMYLASE in the last 168 hours. No results for input(s): AMMONIA in the last 168 hours. CBC: Recent Labs  Lab 09/16/2020 0855 09/10/2020 0910  WBC  --  11.4*  HGB 13.9 12.4*   HCT 41.0 42.5  MCV  --  91.0  PLT  --  240   Cardiac Enzymes: No results for input(s): CKTOTAL, CKMB, CKMBINDEX, TROPONINI in the last 168 hours. D-Dimer No results for input(s): DDIMER in the last  72 hours. BNP: Invalid input(s): POCBNP CBG: No results for input(s): GLUCAP in the last 168 hours. Anemia work up No results for input(s): VITAMINB12, FOLATE, FERRITIN, TIBC, IRON, RETICCTPCT in the last 72 hours. Urinalysis    Component Value Date/Time   COLORURINE YELLOW 05/22/2019 0050   APPEARANCEUR CLEAR 05/22/2019 0050   LABSPEC 1.005 05/22/2019 0050   PHURINE 6.0 05/22/2019 0050   GLUCOSEU NEGATIVE 05/22/2019 0050   HGBUR NEGATIVE 05/22/2019 0050   BILIRUBINUR NEGATIVE 05/22/2019 0050   BILIRUBINUR n 04/18/2015 1106   KETONESUR NEGATIVE 05/22/2019 0050   PROTEINUR NEGATIVE 05/22/2019 0050   UROBILINOGEN negative 04/18/2015 1106   NITRITE NEGATIVE 05/22/2019 0050   LEUKOCYTESUR NEGATIVE 05/22/2019 0050   Sepsis Labs Invalid input(s): PROCALCITONIN,  WBC,  LACTICIDVEN     SIGNED:  Charlynne Cousins, MD  [facility] 09/17/2020, 4:18 PM Pager   If 7PM-7AM, please contact night-coverage [email or web address] Password [email or web address]

## 2020-09-19 NOTE — Care Management Important Message (Signed)
Important Message  Patient Details  Name: Leonard [name] MRN: [id number] Date of Birth: Nov 07, 1945   Medicare Important Message Given:  Yes  Patient is at the end of Life out of respect no IM given will mail to the IM  to the patient home address.   [name] October 01, 2020, 3:37 PM

## 2020-09-19 NOTE — Progress Notes (Signed)
At this time, patient stopped breathing. Family is at bedside. This RN and Nate,RN confirmed death.

## 2020-09-19 NOTE — Progress Notes (Addendum)
76 mL of dilaudid properly wasted with Nate,RN.

## 2020-09-19 NOTE — Progress Notes (Addendum)
Daily Progress Note   Patient Name: Leonard [name]       Date: 10/09/20 DOB: Dec 18, 1945  Age: 75 y.o. MRN#: [id number] Attending Physician: Charlynne Cousins, MD Primary Care Physician: Girtha Rm, NP-C Admit Date: 09/12/2020  Reason for Consultation/Follow-up: Non pain symptom management, Pain control, Psychosocial/spiritual support, and Terminal Care  Subjective: Chart review performed. Noted dilaudid drip increased to 3mg /hr. Patient's wife has declined transfer to [facility]. Received report from primary RN - acute concern of respiratory secretions - she has treated with robinul x1.   Went to visit patient at bedside - no family/visitors present. Patient was lying in bed asleep - he does not wake to voice/gentle touch. Audible respiratory secretions noted without the use of stethoscope. No other signs or non-verbal gestures of pain or discomfort noted. No respiratory distress, slight increased work of breathing noted.  Provided 1mg  bolus dose of dilaudid.    Length of Stay: 2  Current Medications: Scheduled Meds:   sodium chloride flush  3 mL Intravenous Q12H    Continuous Infusions:  sodium chloride     HYDROmorphone 3 mg/hr (09-Oct-2020 0953)    PRN Meds: sodium chloride, acetaminophen **OR** acetaminophen, antiseptic oral rinse, diphenhydrAMINE, glycopyrrolate **OR** glycopyrrolate **OR** glycopyrrolate, haloperidol **OR** haloperidol **OR** haloperidol lactate, LORazepam **OR** LORazepam **OR** LORazepam, ondansetron **OR** ondansetron (ZOFRAN) IV, polyvinyl alcohol, sodium chloride flush  Physical Exam Vitals and nursing note reviewed.  Constitutional:      General: He is not in acute distress.    Appearance: He is ill-appearing.  Pulmonary:     Effort: No  respiratory distress.     Comments: Slight increased work of breathing, respiratory secretions present Skin:    General: Skin is warm and dry.  Neurological:     Mental Status: He is unresponsive.     Motor: Weakness present.  Psychiatric:        Speech: He is noncommunicative.            Vital Signs: BP (!) 69/41 (BP Location: Left Arm)   Pulse (!) 43   Temp 97.9 F (36.6 C) (Oral)   Resp 15   Ht 6\' 1"  (1.854 m)   Wt 113.4 kg   SpO2 99%   BMI 32.98 kg/m  SpO2: SpO2: 99 % O2 Device: O2 Device: Nasal Cannula O2  Flow Rate:    Intake/output summary:  Intake/Output Summary (Last 24 hours) at Sep 18, 2020 1059 Last data filed at 18-Sep-2020 0900 Gross per 24 hour  Intake 46.34 ml  Output 0 ml  Net 46.34 ml   LBM:   Baseline Weight: Weight: 113.4 kg Most recent weight: Weight: 113.4 kg       Palliative Assessment/Data: PPS 10%      Patient Active Problem List   Diagnosis Date Noted   Pressure injury of skin 09/12/2020   Biventricular failure ([place])    Acute on chronic combined systolic and diastolic CHF (congestive heart failure) ([place]) 08/19/2020   Acute kidney injury superimposed on CKD ([place]) 08/20/2020   Encounter for disabling of implantable cardioverter-defibrillator (ICD) in patient receiving comfort measures only 09/08/2020   Cardiorenal syndrome with renal failure 08/23/2020    Palliative Care Assessment & Plan   Patient Profile: 75 y.o. 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 He presented to [facility] emergency department on 09/07/2020 after having a fall at his SNF.  In the ED, he was found to be in profound cardiogenic shock. Labs significant for CO2 16, lactic acid 2.5, and creatinine greater than 4. Per heart failure team, he is not a candidate for  advanced therapies.  Palliative Medicine was asked to assist with symptom management and evaluation for hospice services.    Assessment: Acute on chronic combined heart failure Cardiogenic shock AKI on CKD End of life care Dyspnea Terminal respiratory secretions  Recommendations/Plan: Continue full comfort measures Continue DNR/DNI as previously documented Family decline transfer to [facility] - anticipate hospital death Dilaudid 1mg  bolus provided. Agree with increase to 3mg /hr Scheduled robinul 0.4mg  IV q4 hr due to excessive secretions. First dose to be given now. Continue robinul 0.2 q4 PRN Per previous notes, ICD has been turned off Continue other current comfort focused medication regimen PMT will continue to follow and support holistically  Goals of Care and Additional Recommendations: Limitations on Scope of Treatment: Full Comfort Care  Code Status:    Code Status Orders  (From admission, onward)           Start     Ordered   09/07/2020 1857  Do not attempt resuscitation (DNR)  Continuous       Question Answer Comment  In the event of cardiac or respiratory ARREST Do not call a "code blue"   In the event of cardiac or respiratory ARREST Do not perform Intubation, CPR, defibrillation or ACLS   In the event of cardiac or respiratory ARREST Use medication by any route, position, wound care, and other measures to relive pain and suffering. May use oxygen, suction and manual treatment of airway obstruction as needed for comfort.      09/08/2020 1856           Code Status History     Date Active Date Inactive Code Status Order ID Comments User Context   09/09/2020 1341 09/17/2020 1856 DNR [id number]  Isla Pence, MD ED   09/16/2020 1100 09/05/2020 1340 DNR [id number]  Isla Pence, MD ED       Prognosis:  Hours - Days  Discharge Planning: Anticipated Hospital Death  Care plan was discussed with primary RN  Thank you for allowing the Palliative  Medicine Team to assist in  the care of this patient.   Total Time 20 minutes Prolonged Time Billed  no       Greater than 50%  of this time was spent counseling and coordinating care related to the above assessment and plan.  Lin Landsman, NP  Please contact Palliative Medicine Team phone at [phone] for questions and concerns.

## 2020-09-19 NOTE — Progress Notes (Signed)
Nutrition Brief Note  Chart reviewed. Pt now transitioning to comfort care.  No further nutrition interventions planned at this time.  Please re-consult as needed.   Breelle Hollywood W, RD, LDN, CDCES Registered Dietitian II Certified Diabetes Care and Education Specialist Please refer to [facility] for RD and/or RD on-call/weekend/after hours pager   

## 2020-09-19 DEATH — deceased

## 2020-09-20 ENCOUNTER — Telehealth: Payer: Self-pay

## 2020-09-20 NOTE — Telephone Encounter (Signed)
Opened in error

## 2020-09-20 NOTE — Telephone Encounter (Signed)
-----   Message from Rafael Bihari,  sent at 09/10/2020  9:48 AM EDT ----- Regarding: [facility] Hi Leonard [name], Leonard [name] is in a SNF but his wife says she can bring his transmitter there. Can you do a transmission in 1 week? Thank you! -J

## 2020-10-21 ENCOUNTER — Encounter (HOSPITAL_COMMUNITY): Payer: Medicare Other | Admitting: Internal Medicine

## 2020-11-06 ENCOUNTER — Ambulatory Visit: Payer: Medicare Other | Admitting: Cardiology

## 2020-11-22 ENCOUNTER — Ambulatory Visit: Payer: Medicare Other | Admitting: Cardiology

## 2021-09-23 IMAGING — CT CT HEAD W/O CM
4 series · 17 of 47 positions shown, 19 images · non-contrast
Comparison: None.

CLINICAL DATA: 73-year-old male with altered mental status.

EXAM:
CT HEAD WITHOUT CONTRAST
TECHNIQUE: Contiguous axial images were obtained from the base of the skull
through the vertex without intravenous contrast.

[Series 3: head wo · axial · 0.52mm/px · z∈[-56,+84]mm · 7 of 38 slices shown, 9 images]
[im 5/38  brain]
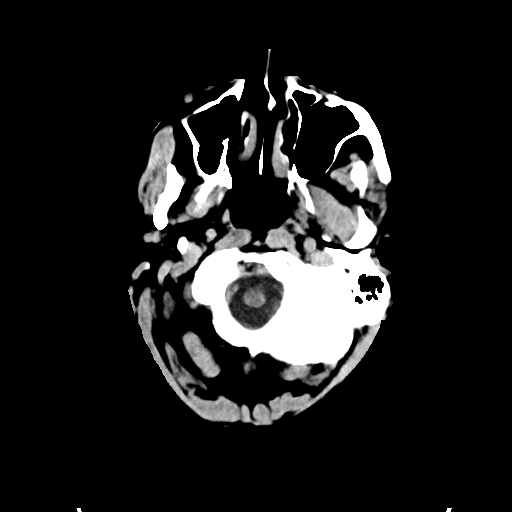
[im 5/38  bone]
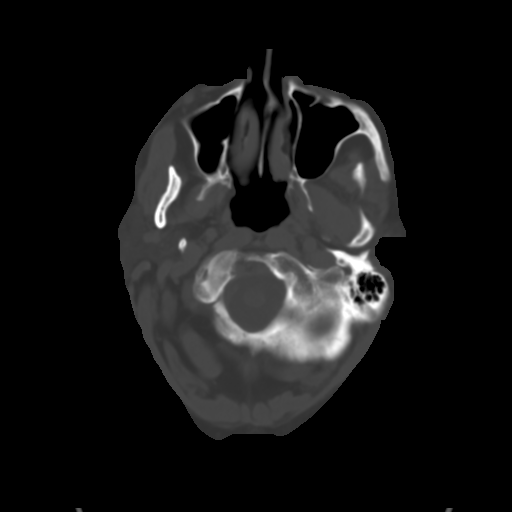
[im 10/38  brain]
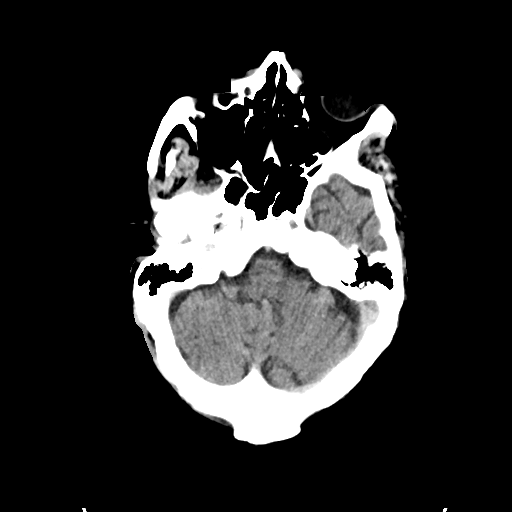
[im 14/38  brain]
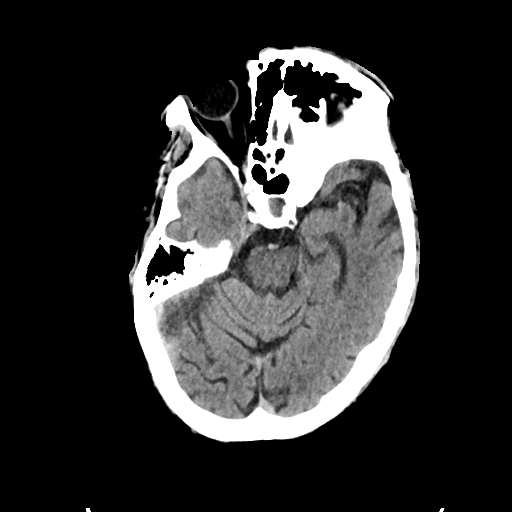
[im 19/38  brain]
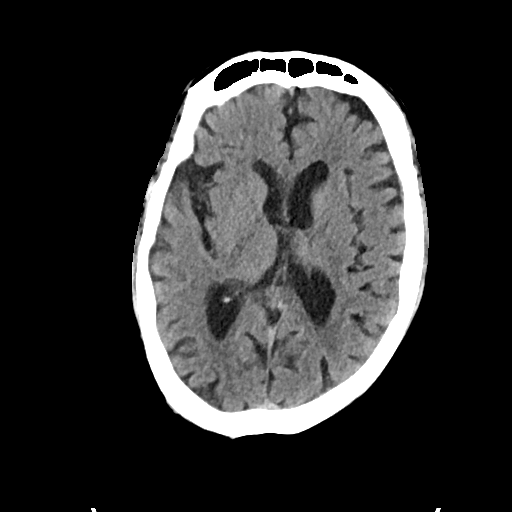
[im 24/38  brain]
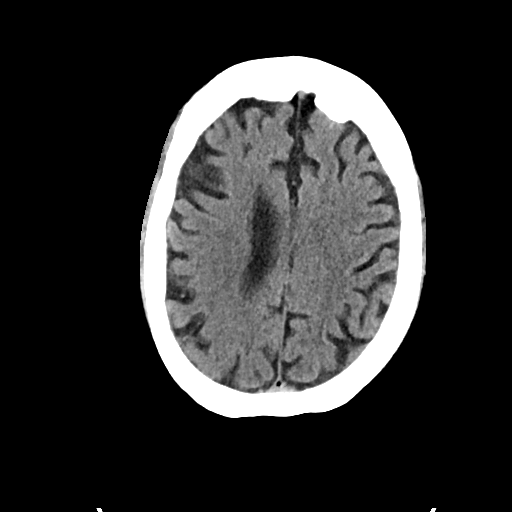
[im 24/38  bone]
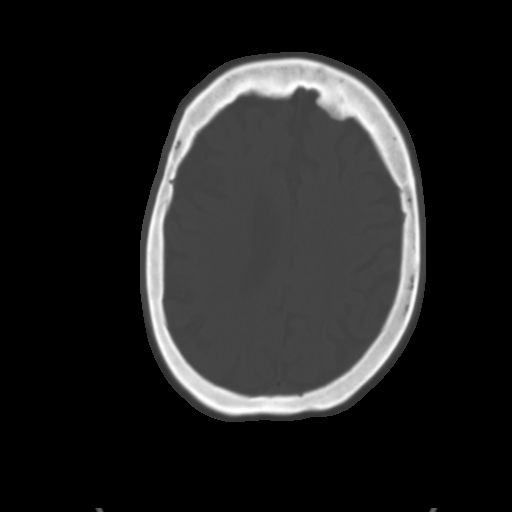
[im 28/38  brain]
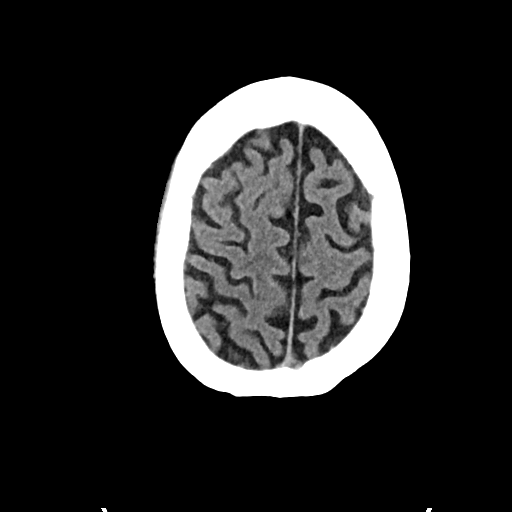
[im 33/38  brain]
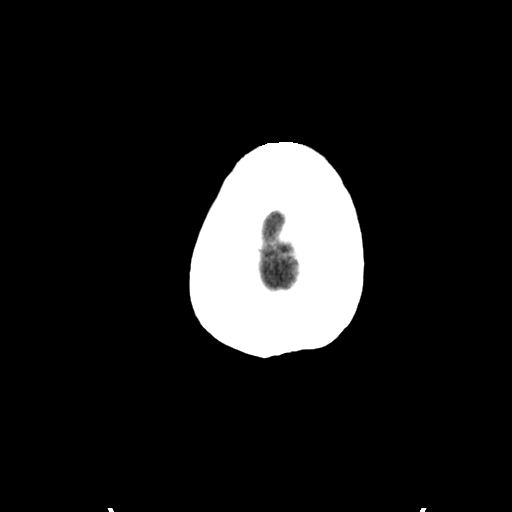

[Series 4: head bone · axial · 0.52mm/px · z∈[-58,+6]mm · 4 of 93 slices shown]
[im 10/93  bone]
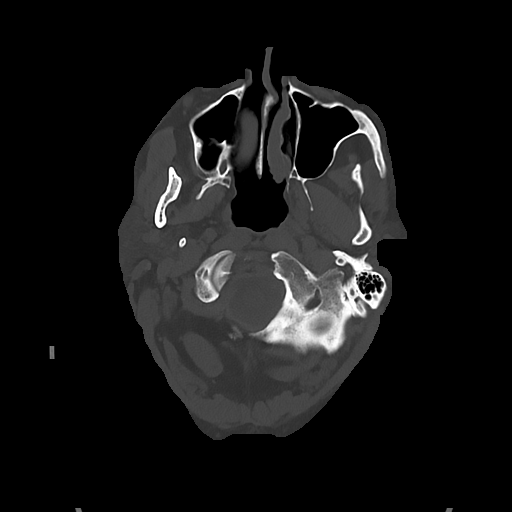
[im 19/93  bone]
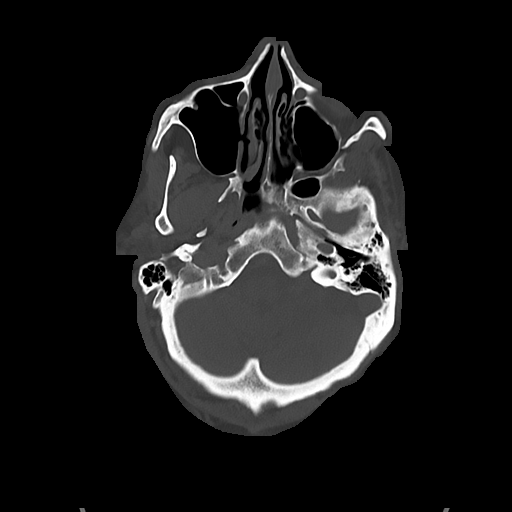
[im 28/93  bone]
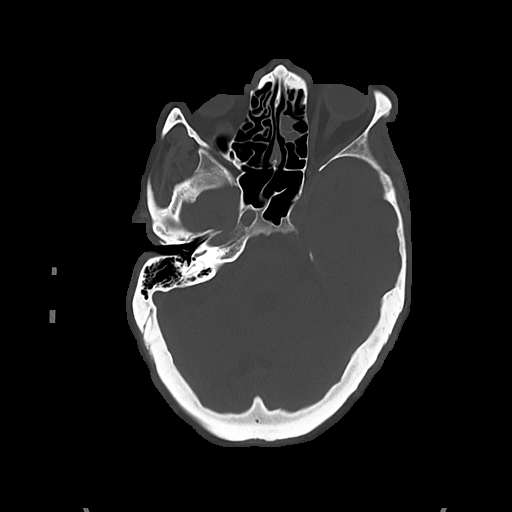
[im 42/93  bone]
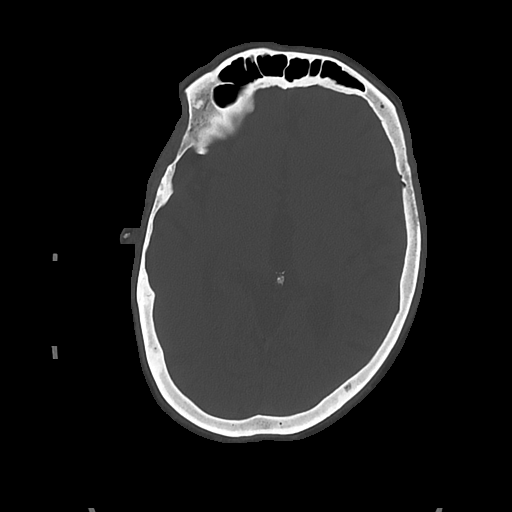

[Series 5: cor soft · coronal · 0.36mm/px · 3 of 77 slices shown]
[im 26/77  brain]
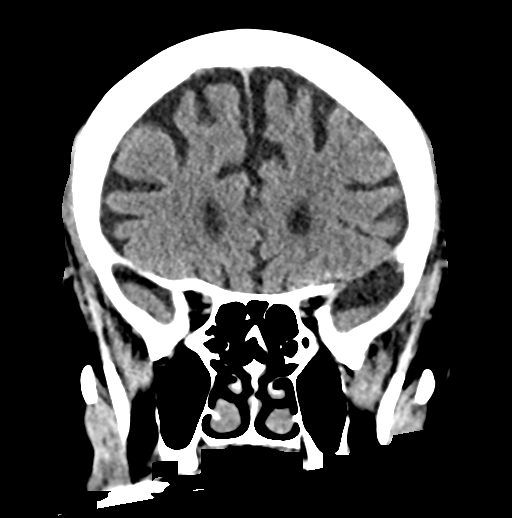
[im 34/77  brain]
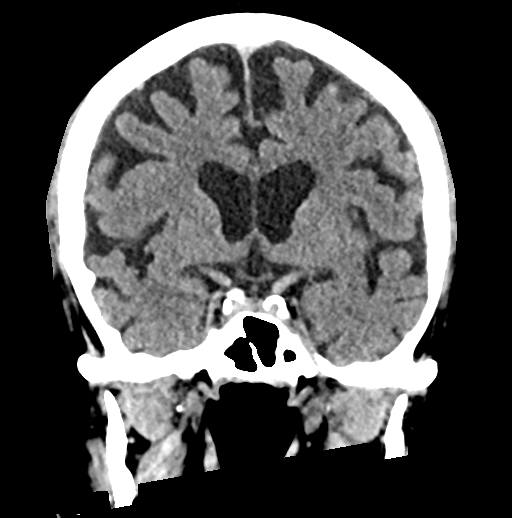
[im 43/77  brain]
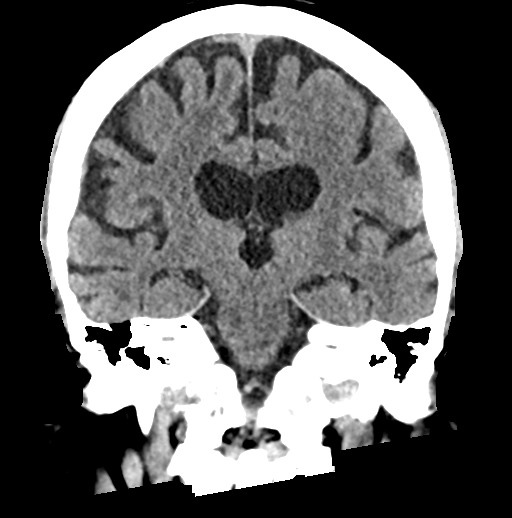

[Series 6: sag soft · sagittal · 0.45mm/px · 3 of 62 slices shown]
[im 25/62  brain]
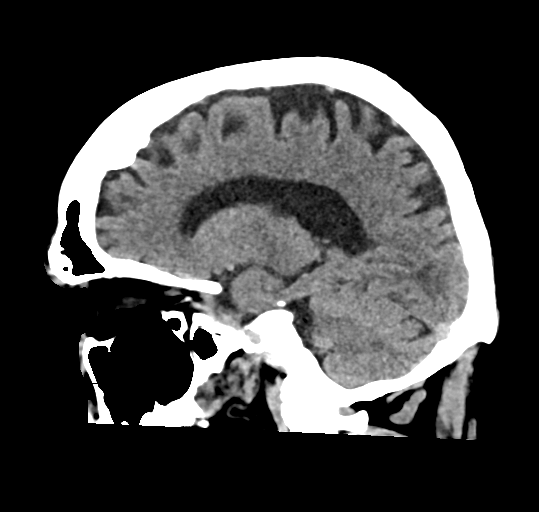
[im 31/62  brain]
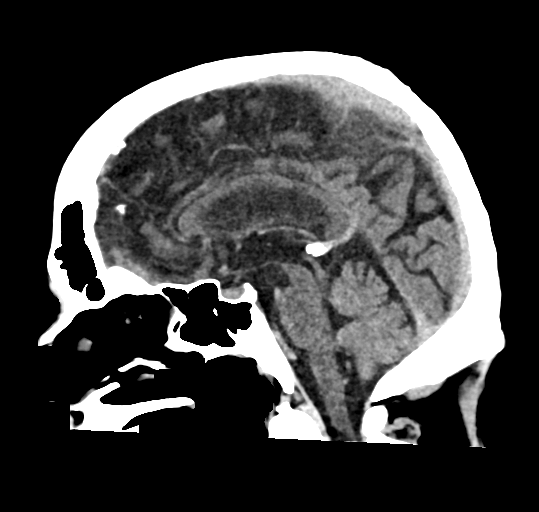
[im 37/62  brain]
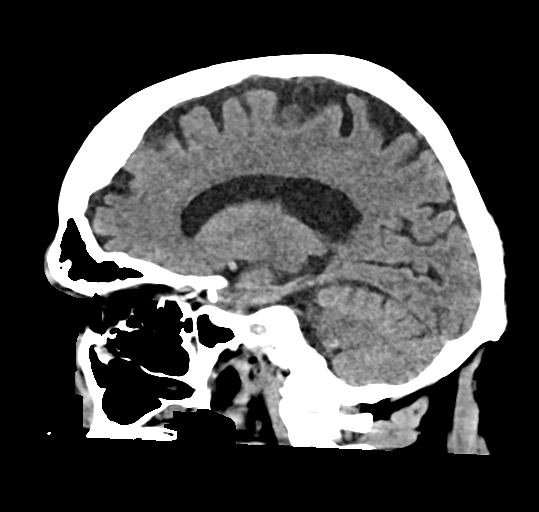

[17 of 47 positions shown; findings below may reference images not displayed]

FINDINGS: Brain: There is mild age-related atrophy and chronic microvascular
ischemic changes. There is no acute intracranial hemorrhage. No mass
effect or midline shift. No extra-axial fluid collection.

Vascular: No hyperdense vessel or unexpected calcification.

Skull: Normal. Negative for fracture or focal lesion.

Sinuses/Orbits: Partial opacification of several ethmoid air cells.
No air-fluid level. The mastoid air cells are clear.

Other: None
IMPRESSION: 1. No acute intracranial hemorrhage.
2. Mild age-related atrophy and chronic microvascular ischemic
changes.
